# Patient Record
Sex: Female | Born: 1955 | Race: White | Hispanic: No | Marital: Married | Smoking: Never smoker
Health system: Southern US, Community
[De-identification: ages and names within clinical notes are randomized; demographics above are authoritative.]

## PROBLEM LIST (undated history)

## (undated) DIAGNOSIS — I7300 Raynaud's syndrome without gangrene: Secondary | ICD-10-CM

## (undated) DIAGNOSIS — T7840XA Allergy, unspecified, initial encounter: Secondary | ICD-10-CM

## (undated) DIAGNOSIS — Z9889 Other specified postprocedural states: Secondary | ICD-10-CM

## (undated) DIAGNOSIS — Z87442 Personal history of urinary calculi: Secondary | ICD-10-CM

## (undated) DIAGNOSIS — K219 Gastro-esophageal reflux disease without esophagitis: Secondary | ICD-10-CM

## (undated) DIAGNOSIS — R011 Cardiac murmur, unspecified: Secondary | ICD-10-CM

## (undated) DIAGNOSIS — I10 Essential (primary) hypertension: Secondary | ICD-10-CM

## (undated) DIAGNOSIS — M810 Age-related osteoporosis without current pathological fracture: Secondary | ICD-10-CM

## (undated) DIAGNOSIS — R002 Palpitations: Secondary | ICD-10-CM

## (undated) DIAGNOSIS — M1990 Unspecified osteoarthritis, unspecified site: Secondary | ICD-10-CM

## (undated) DIAGNOSIS — F419 Anxiety disorder, unspecified: Secondary | ICD-10-CM

## (undated) DIAGNOSIS — J302 Other seasonal allergic rhinitis: Secondary | ICD-10-CM

## (undated) DIAGNOSIS — G8929 Other chronic pain: Secondary | ICD-10-CM

## (undated) DIAGNOSIS — F329 Major depressive disorder, single episode, unspecified: Secondary | ICD-10-CM

## (undated) DIAGNOSIS — I011 Acute rheumatic endocarditis: Secondary | ICD-10-CM

## (undated) DIAGNOSIS — Z9289 Personal history of other medical treatment: Secondary | ICD-10-CM

## (undated) DIAGNOSIS — K589 Irritable bowel syndrome without diarrhea: Secondary | ICD-10-CM

## (undated) DIAGNOSIS — R32 Unspecified urinary incontinence: Secondary | ICD-10-CM

## (undated) DIAGNOSIS — F32A Depression, unspecified: Secondary | ICD-10-CM

## (undated) DIAGNOSIS — Z8709 Personal history of other diseases of the respiratory system: Secondary | ICD-10-CM

## (undated) DIAGNOSIS — J189 Pneumonia, unspecified organism: Secondary | ICD-10-CM

## (undated) DIAGNOSIS — M8580 Other specified disorders of bone density and structure, unspecified site: Secondary | ICD-10-CM

## (undated) DIAGNOSIS — R232 Flushing: Secondary | ICD-10-CM

## (undated) DIAGNOSIS — Z8669 Personal history of other diseases of the nervous system and sense organs: Secondary | ICD-10-CM

## (undated) DIAGNOSIS — L309 Dermatitis, unspecified: Secondary | ICD-10-CM

## (undated) DIAGNOSIS — N3010 Interstitial cystitis (chronic) without hematuria: Secondary | ICD-10-CM

## (undated) DIAGNOSIS — M549 Dorsalgia, unspecified: Secondary | ICD-10-CM

## (undated) DIAGNOSIS — Z5189 Encounter for other specified aftercare: Secondary | ICD-10-CM

## (undated) DIAGNOSIS — D649 Anemia, unspecified: Secondary | ICD-10-CM

## (undated) DIAGNOSIS — R200 Anesthesia of skin: Secondary | ICD-10-CM

## (undated) DIAGNOSIS — H547 Unspecified visual loss: Secondary | ICD-10-CM

## (undated) DIAGNOSIS — R112 Nausea with vomiting, unspecified: Secondary | ICD-10-CM

## (undated) HISTORY — PX: TONSILLECTOMY: SUR1361

## (undated) HISTORY — DX: Allergy, unspecified, initial encounter: T78.40XA

## (undated) HISTORY — DX: Major depressive disorder, single episode, unspecified: F32.9

## (undated) HISTORY — DX: Age-related osteoporosis without current pathological fracture: M81.0

## (undated) HISTORY — DX: Acute rheumatic endocarditis: I01.1

## (undated) HISTORY — PX: EXCISION MORTON'S NEUROMA: SHX5013

## (undated) HISTORY — DX: Irritable bowel syndrome, unspecified: K58.9

## (undated) HISTORY — DX: Interstitial cystitis (chronic) without hematuria: N30.10

## (undated) HISTORY — DX: Unspecified osteoarthritis, unspecified site: M19.90

## (undated) HISTORY — DX: Palpitations: R00.2

## (undated) HISTORY — DX: Encounter for other specified aftercare: Z51.89

## (undated) HISTORY — PX: COLONOSCOPY: SHX174

## (undated) HISTORY — DX: Gastro-esophageal reflux disease without esophagitis: K21.9

## (undated) HISTORY — PX: OTHER SURGICAL HISTORY: SHX169

## (undated) HISTORY — DX: Essential (primary) hypertension: I10

## (undated) HISTORY — PX: BACK SURGERY: SHX140

## (undated) HISTORY — DX: Personal history of other medical treatment: Z92.89

## (undated) HISTORY — DX: Flushing: R23.2

## (undated) HISTORY — PX: ENDOMETRIAL ABLATION: SHX621

## (undated) HISTORY — DX: Cardiac murmur, unspecified: R01.1

## (undated) HISTORY — DX: Depression, unspecified: F32.A

## (undated) HISTORY — DX: Unspecified urinary incontinence: R32

## (undated) HISTORY — DX: Anxiety disorder, unspecified: F41.9

## (undated) HISTORY — DX: Raynaud's syndrome without gangrene: I73.00

---
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
## 1995-07-15 HISTORY — PX: ABDOMINAL HYSTERECTOMY: SHX81

## 1997-07-14 HISTORY — PX: BREAST BIOPSY: SHX20

## 2004-07-01 ENCOUNTER — Ambulatory Visit: Payer: Self-pay | Admitting: Internal Medicine

## 2004-07-14 LAB — HM COLONOSCOPY

## 2004-08-23 ENCOUNTER — Ambulatory Visit: Payer: Self-pay | Admitting: Internal Medicine

## 2004-09-04 ENCOUNTER — Ambulatory Visit: Payer: Self-pay | Admitting: Cardiology

## 2004-09-17 ENCOUNTER — Ambulatory Visit: Payer: Self-pay

## 2004-09-19 ENCOUNTER — Ambulatory Visit: Payer: Self-pay | Admitting: Internal Medicine

## 2004-11-20 ENCOUNTER — Ambulatory Visit: Payer: Self-pay | Admitting: Internal Medicine

## 2004-11-26 ENCOUNTER — Ambulatory Visit: Payer: Self-pay | Admitting: Internal Medicine

## 2004-12-24 ENCOUNTER — Ambulatory Visit (HOSPITAL_COMMUNITY): Admission: RE | Admit: 2004-12-24 | Discharge: 2004-12-24 | Payer: Self-pay | Admitting: Internal Medicine

## 2004-12-24 ENCOUNTER — Ambulatory Visit: Payer: Self-pay | Admitting: Internal Medicine

## 2005-02-07 ENCOUNTER — Other Ambulatory Visit: Admission: RE | Admit: 2005-02-07 | Discharge: 2005-02-07 | Payer: Self-pay | Admitting: Obstetrics & Gynecology

## 2005-04-03 ENCOUNTER — Ambulatory Visit: Payer: Self-pay | Admitting: Endocrinology

## 2005-05-21 ENCOUNTER — Encounter: Admission: RE | Admit: 2005-05-21 | Discharge: 2005-05-21 | Payer: Self-pay | Admitting: Obstetrics & Gynecology

## 2005-06-16 ENCOUNTER — Ambulatory Visit: Payer: Self-pay | Admitting: Internal Medicine

## 2005-07-14 HISTORY — PX: ABDOMINAL HYSTERECTOMY: SHX81

## 2005-07-29 ENCOUNTER — Ambulatory Visit: Payer: Self-pay | Admitting: Internal Medicine

## 2005-09-15 ENCOUNTER — Ambulatory Visit: Payer: Self-pay | Admitting: Internal Medicine

## 2005-10-12 ENCOUNTER — Encounter: Payer: Self-pay | Admitting: Internal Medicine

## 2005-10-12 LAB — CONVERTED CEMR LAB: Pap Smear: NORMAL

## 2005-10-14 ENCOUNTER — Ambulatory Visit (HOSPITAL_COMMUNITY): Admission: RE | Admit: 2005-10-14 | Discharge: 2005-10-15 | Payer: Self-pay | Admitting: Obstetrics & Gynecology

## 2005-10-14 ENCOUNTER — Encounter (INDEPENDENT_AMBULATORY_CARE_PROVIDER_SITE_OTHER): Payer: Self-pay | Admitting: *Deleted

## 2006-01-22 ENCOUNTER — Ambulatory Visit: Payer: Self-pay | Admitting: Gastroenterology

## 2006-01-30 ENCOUNTER — Ambulatory Visit: Payer: Self-pay | Admitting: Gastroenterology

## 2006-02-13 ENCOUNTER — Ambulatory Visit: Payer: Self-pay | Admitting: Gastroenterology

## 2006-02-27 ENCOUNTER — Ambulatory Visit: Payer: Self-pay | Admitting: Internal Medicine

## 2006-03-03 ENCOUNTER — Ambulatory Visit: Payer: Self-pay | Admitting: Gastroenterology

## 2006-03-06 ENCOUNTER — Emergency Department (HOSPITAL_COMMUNITY): Admission: EM | Admit: 2006-03-06 | Discharge: 2006-03-06 | Payer: Self-pay | Admitting: Family Medicine

## 2006-03-19 ENCOUNTER — Ambulatory Visit: Payer: Self-pay | Admitting: Internal Medicine

## 2006-04-01 ENCOUNTER — Ambulatory Visit: Payer: Self-pay | Admitting: Gastroenterology

## 2006-04-21 ENCOUNTER — Ambulatory Visit: Payer: Self-pay | Admitting: Internal Medicine

## 2006-04-21 ENCOUNTER — Ambulatory Visit (HOSPITAL_COMMUNITY): Admission: RE | Admit: 2006-04-21 | Discharge: 2006-04-21 | Payer: Self-pay | Admitting: Internal Medicine

## 2006-04-21 LAB — CONVERTED CEMR LAB
ALT: 20 units/L (ref 0–40)
AST: 23 units/L (ref 0–37)
Albumin: 4.4 g/dL (ref 3.5–5.2)
Alkaline Phosphatase: 73 units/L (ref 39–117)
BUN: 13 mg/dL (ref 6–23)
Bacteria, U Microscopic: NEGATIVE /hpf
Basophils Absolute: 0.1 10*3/uL (ref 0.0–0.1)
Basophils Relative: 0.9 % (ref 0.0–1.0)
Bilirubin Urine: NEGATIVE
CO2: 26 meq/L (ref 19–32)
Calcium: 9.6 mg/dL (ref 8.4–10.5)
Chloride: 106 meq/L (ref 96–112)
Creatinine, Ser: 0.9 mg/dL (ref 0.4–1.2)
Crystals: NEGATIVE
Eosinophil percent: 1.5 % (ref 0.0–5.0)
Folate: 14.6 ng/mL
Free T4: 0.7 ng/dL — ABNORMAL LOW (ref 0.9–1.8)
GFR calc non Af Amer: 71 mL/min
Glomerular Filtration Rate, Af Am: 86 mL/min/{1.73_m2}
Glucose, Bld: 95 mg/dL (ref 70–99)
HCT: 44.3 % (ref 36.0–46.0)
Hemoglobin, Urine: NEGATIVE
Hemoglobin: 14.8 g/dL (ref 12.0–15.0)
Ketones, ur: NEGATIVE mg/dL
Leukocytes, UA: NEGATIVE
Lymphocytes Relative: 21.1 % (ref 12.0–46.0)
MCHC: 33.3 g/dL (ref 30.0–36.0)
MCV: 92.8 fL (ref 78.0–100.0)
Monocytes Absolute: 0.6 10*3/uL (ref 0.2–0.7)
Monocytes Relative: 7.3 % (ref 3.0–11.0)
Mucus, UA: NEGATIVE
Neutro Abs: 5.7 10*3/uL (ref 1.4–7.7)
Neutrophils Relative %: 69.2 % (ref 43.0–77.0)
Nitrite: NEGATIVE
Platelets: 211 10*3/uL (ref 150–400)
Potassium: 4.1 meq/L (ref 3.5–5.1)
RBC / HPF: NONE SEEN
RBC: 4.77 M/uL (ref 3.87–5.11)
RDW: 13.7 % (ref 11.5–14.6)
Rheumatoid Fact: <20 intl units/mL — ABNORMAL LOW (ref 0.0–20.0)
Sed Rate: 2 mm/hr (ref 0–25)
Sodium: 140 meq/L (ref 135–145)
Specific Gravity, Urine: >=1.03 (ref 1.000–1.03)
TSH: 1.57 microintl units/mL (ref 0.35–5.50)
Total Bilirubin: 0.6 mg/dL (ref 0.3–1.2)
Total Protein, Urine: NEGATIVE mg/dL
Total Protein: 7.2 g/dL (ref 6.0–8.3)
Urine Glucose: NEGATIVE mg/dL
Urobilinogen, UA: 0.2 (ref 0.0–1.0)
Vitamin B-12: 296 pg/mL (ref 211–911)
WBC number, urine, microscopy: NONE SEEN /hpf
WBC: 8.2 10*3/uL (ref 4.5–10.5)
pH: 5.5 (ref 5.0–8.0)

## 2006-05-04 ENCOUNTER — Ambulatory Visit: Payer: Self-pay | Admitting: Gastroenterology

## 2006-05-05 ENCOUNTER — Ambulatory Visit: Payer: Self-pay | Admitting: Gastroenterology

## 2006-05-20 ENCOUNTER — Encounter: Admission: RE | Admit: 2006-05-20 | Discharge: 2006-05-20 | Payer: Self-pay | Admitting: Obstetrics & Gynecology

## 2006-05-28 ENCOUNTER — Ambulatory Visit: Payer: Self-pay | Admitting: Internal Medicine

## 2006-07-06 ENCOUNTER — Ambulatory Visit (HOSPITAL_COMMUNITY): Admission: RE | Admit: 2006-07-06 | Discharge: 2006-07-06 | Payer: Self-pay | Admitting: Internal Medicine

## 2006-07-09 ENCOUNTER — Emergency Department (HOSPITAL_COMMUNITY): Admission: EM | Admit: 2006-07-09 | Discharge: 2006-07-09 | Payer: Self-pay | Admitting: Emergency Medicine

## 2006-07-13 ENCOUNTER — Ambulatory Visit: Payer: Self-pay | Admitting: Internal Medicine

## 2006-07-23 ENCOUNTER — Ambulatory Visit: Payer: Self-pay | Admitting: Internal Medicine

## 2006-07-30 ENCOUNTER — Ambulatory Visit: Payer: Self-pay | Admitting: Internal Medicine

## 2006-07-30 LAB — CONVERTED CEMR LAB
ALT: 25 units/L (ref 0–40)
AST: 29 units/L (ref 0–37)
Albumin: 3.7 g/dL (ref 3.5–5.2)
Alkaline Phosphatase: 72 units/L (ref 39–117)
Angiotensin 1 Converting Enzyme: 34 units/L (ref 9–67)
Anticardiolipin IgA: 7 (ref <13)
Anticardiolipin IgG: <7 (ref <11)
Anticardiolipin IgM: <7 (ref <10)
BUN: 10 mg/dL (ref 6–23)
CO2: 29 meq/L (ref 19–32)
Calcium: 9 mg/dL (ref 8.4–10.5)
Chloride: 104 meq/L (ref 96–112)
Creatinine, Ser: 0.8 mg/dL (ref 0.4–1.2)
ENA SM Ab Ser-aCnc: <0.2 (ref <1.0)
GFR calc Af Amer: 98 mL/min
GFR calc non Af Amer: 81 mL/min
Glucose, Bld: 85 mg/dL (ref 70–99)
HCT: 41.3 % (ref 36.0–46.0)
Hemoglobin: 13.7 g/dL (ref 12.0–15.0)
MCHC: 33.1 g/dL (ref 30.0–36.0)
MCV: 92.1 fL (ref 78.0–100.0)
Platelets: 169 10*3/uL (ref 150–400)
Potassium: 3.6 meq/L (ref 3.5–5.1)
RBC: 4.48 M/uL (ref 3.87–5.11)
RDW: 11.8 % (ref 11.5–14.6)
Sodium: 138 meq/L (ref 135–145)
Total Bilirubin: 0.5 mg/dL (ref 0.3–1.2)
Total Protein: 6.3 g/dL (ref 6.0–8.3)
WBC: 4.1 10*3/uL — ABNORMAL LOW (ref 4.5–10.5)

## 2006-08-26 ENCOUNTER — Ambulatory Visit (HOSPITAL_COMMUNITY): Admission: RE | Admit: 2006-08-26 | Discharge: 2006-08-26 | Payer: Self-pay | Admitting: Otolaryngology

## 2006-09-08 ENCOUNTER — Ambulatory Visit: Payer: Self-pay | Admitting: Internal Medicine

## 2006-09-08 LAB — CONVERTED CEMR LAB
Bilirubin Urine: NEGATIVE
Hemoglobin, Urine: NEGATIVE
Leukocytes, UA: NEGATIVE
Nitrite: NEGATIVE
Specific Gravity, Urine: 1.02 (ref 1.000–1.03)
Total Protein, Urine: NEGATIVE mg/dL
Urine Glucose: NEGATIVE mg/dL
Urobilinogen, UA: 0.2 (ref 0.0–1.0)
pH: 7 (ref 5.0–8.0)

## 2006-09-30 ENCOUNTER — Ambulatory Visit: Payer: Self-pay | Admitting: Internal Medicine

## 2007-01-21 ENCOUNTER — Ambulatory Visit (HOSPITAL_COMMUNITY): Admission: RE | Admit: 2007-01-21 | Discharge: 2007-01-21 | Payer: Self-pay | Admitting: Obstetrics & Gynecology

## 2007-01-21 ENCOUNTER — Encounter (INDEPENDENT_AMBULATORY_CARE_PROVIDER_SITE_OTHER): Payer: Self-pay | Admitting: Obstetrics & Gynecology

## 2007-02-11 ENCOUNTER — Emergency Department (HOSPITAL_COMMUNITY): Admission: EM | Admit: 2007-02-11 | Discharge: 2007-02-11 | Payer: Self-pay | Admitting: Emergency Medicine

## 2007-02-12 ENCOUNTER — Ambulatory Visit: Payer: Self-pay | Admitting: Internal Medicine

## 2007-02-12 ENCOUNTER — Telehealth (INDEPENDENT_AMBULATORY_CARE_PROVIDER_SITE_OTHER): Payer: Self-pay | Admitting: *Deleted

## 2007-02-13 DIAGNOSIS — K219 Gastro-esophageal reflux disease without esophagitis: Secondary | ICD-10-CM | POA: Insufficient documentation

## 2007-02-13 HISTORY — DX: Gastro-esophageal reflux disease without esophagitis: K21.9

## 2007-04-21 ENCOUNTER — Ambulatory Visit: Payer: Self-pay | Admitting: Internal Medicine

## 2007-04-22 ENCOUNTER — Ambulatory Visit: Payer: Self-pay | Admitting: Internal Medicine

## 2007-05-18 ENCOUNTER — Telehealth (INDEPENDENT_AMBULATORY_CARE_PROVIDER_SITE_OTHER): Payer: Self-pay | Admitting: *Deleted

## 2007-05-26 ENCOUNTER — Encounter: Admission: RE | Admit: 2007-05-26 | Discharge: 2007-05-26 | Payer: Self-pay | Admitting: Obstetrics & Gynecology

## 2007-06-25 ENCOUNTER — Ambulatory Visit: Payer: Self-pay | Admitting: Internal Medicine

## 2007-08-27 ENCOUNTER — Telehealth: Payer: Self-pay | Admitting: Internal Medicine

## 2007-09-06 ENCOUNTER — Ambulatory Visit (HOSPITAL_COMMUNITY): Admission: RE | Admit: 2007-09-06 | Discharge: 2007-09-06 | Payer: Self-pay | Admitting: Orthopedic Surgery

## 2007-09-17 ENCOUNTER — Emergency Department (HOSPITAL_COMMUNITY): Admission: EM | Admit: 2007-09-17 | Discharge: 2007-09-17 | Payer: Self-pay | Admitting: Family Medicine

## 2007-09-22 ENCOUNTER — Ambulatory Visit: Payer: Self-pay | Admitting: Internal Medicine

## 2007-09-28 ENCOUNTER — Encounter: Payer: Self-pay | Admitting: Internal Medicine

## 2007-10-08 ENCOUNTER — Ambulatory Visit (HOSPITAL_COMMUNITY): Admission: RE | Admit: 2007-10-08 | Discharge: 2007-10-08 | Payer: Self-pay | Admitting: Neurosurgery

## 2007-10-18 ENCOUNTER — Encounter: Payer: Self-pay | Admitting: Internal Medicine

## 2007-11-03 ENCOUNTER — Telehealth: Payer: Self-pay | Admitting: Internal Medicine

## 2007-11-25 ENCOUNTER — Telehealth: Payer: Self-pay | Admitting: Internal Medicine

## 2007-11-25 ENCOUNTER — Encounter: Payer: Self-pay | Admitting: Internal Medicine

## 2007-12-09 ENCOUNTER — Emergency Department (HOSPITAL_COMMUNITY): Admission: EM | Admit: 2007-12-09 | Discharge: 2007-12-10 | Payer: Self-pay | Admitting: Emergency Medicine

## 2008-01-12 ENCOUNTER — Encounter: Payer: Self-pay | Admitting: Internal Medicine

## 2008-02-04 ENCOUNTER — Encounter: Payer: Self-pay | Admitting: Internal Medicine

## 2008-02-11 ENCOUNTER — Ambulatory Visit: Payer: Self-pay | Admitting: Internal Medicine

## 2008-03-07 ENCOUNTER — Encounter: Payer: Self-pay | Admitting: Internal Medicine

## 2008-03-07 ENCOUNTER — Ambulatory Visit: Payer: Self-pay | Admitting: Internal Medicine

## 2008-03-07 HISTORY — PX: FLEXIBLE SIGMOIDOSCOPY: SHX1649

## 2008-03-08 ENCOUNTER — Ambulatory Visit: Payer: Self-pay | Admitting: Family Medicine

## 2008-03-09 ENCOUNTER — Ambulatory Visit (HOSPITAL_COMMUNITY): Admission: RE | Admit: 2008-03-09 | Discharge: 2008-03-09 | Payer: Self-pay | Admitting: Internal Medicine

## 2008-03-09 ENCOUNTER — Telehealth: Payer: Self-pay | Admitting: Internal Medicine

## 2008-03-09 ENCOUNTER — Ambulatory Visit: Payer: Self-pay | Admitting: Family Medicine

## 2008-03-13 ENCOUNTER — Ambulatory Visit: Payer: Self-pay | Admitting: Family Medicine

## 2008-03-14 ENCOUNTER — Telehealth (INDEPENDENT_AMBULATORY_CARE_PROVIDER_SITE_OTHER): Payer: Self-pay

## 2008-03-16 ENCOUNTER — Ambulatory Visit (HOSPITAL_COMMUNITY): Admission: RE | Admit: 2008-03-16 | Discharge: 2008-03-16 | Payer: Self-pay | Admitting: Internal Medicine

## 2008-03-28 ENCOUNTER — Telehealth: Payer: Self-pay | Admitting: Internal Medicine

## 2008-05-16 ENCOUNTER — Ambulatory Visit: Payer: Self-pay | Admitting: Internal Medicine

## 2008-05-16 LAB — CONVERTED CEMR LAB: Vit D, 1,25-Dihydroxy: 36 (ref 30–89)

## 2008-05-18 LAB — CONVERTED CEMR LAB
Basophils Absolute: 0 10*3/uL (ref 0.0–0.1)
Basophils Relative: 0.7 % (ref 0.0–3.0)
Eosinophils Absolute: 0.1 10*3/uL (ref 0.0–0.7)
Eosinophils Relative: 3.3 % (ref 0.0–5.0)
Folate: 17.4 ng/mL
HCT: 41.8 % (ref 36.0–46.0)
Hemoglobin: 14 g/dL (ref 12.0–15.0)
Lymphocytes Relative: 26.9 % (ref 12.0–46.0)
MCHC: 33.5 g/dL (ref 30.0–36.0)
MCV: 93.3 fL (ref 78.0–100.0)
Monocytes Absolute: 0.4 10*3/uL (ref 0.1–1.0)
Monocytes Relative: 10.2 % (ref 3.0–12.0)
Neutro Abs: 2.7 10*3/uL (ref 1.4–7.7)
Neutrophils Relative %: 58.9 % (ref 43.0–77.0)
Platelets: 190 10*3/uL (ref 150–400)
RBC: 4.48 M/uL (ref 3.87–5.11)
RDW: 12.6 % (ref 11.5–14.6)
TSH: 0.96 microintl units/mL (ref 0.35–5.50)
Vitamin B-12: 364 pg/mL (ref 211–911)
WBC: 4.4 10*3/uL — ABNORMAL LOW (ref 4.5–10.5)

## 2008-05-31 ENCOUNTER — Encounter: Admission: RE | Admit: 2008-05-31 | Discharge: 2008-05-31 | Payer: Self-pay | Admitting: Obstetrics & Gynecology

## 2008-06-05 ENCOUNTER — Telehealth: Payer: Self-pay | Admitting: Internal Medicine

## 2008-06-06 ENCOUNTER — Ambulatory Visit: Payer: Self-pay | Admitting: Internal Medicine

## 2008-06-06 LAB — CONVERTED CEMR LAB
Bilirubin Urine: NEGATIVE
Hemoglobin, Urine: NEGATIVE
Ketones, ur: NEGATIVE mg/dL
Leukocytes, UA: NEGATIVE
Nitrite: NEGATIVE
Specific Gravity, Urine: 1.01
Total Protein, Urine: NEGATIVE mg/dL
Urine Glucose: NEGATIVE mg/dL
Urobilinogen, UA: 0.2
pH: 7

## 2008-06-19 ENCOUNTER — Telehealth: Payer: Self-pay | Admitting: Internal Medicine

## 2008-08-08 ENCOUNTER — Encounter: Payer: Self-pay | Admitting: Internal Medicine

## 2008-08-08 ENCOUNTER — Telehealth: Payer: Self-pay | Admitting: Internal Medicine

## 2008-09-01 ENCOUNTER — Telehealth: Payer: Self-pay | Admitting: Internal Medicine

## 2008-09-19 ENCOUNTER — Telehealth: Payer: Self-pay | Admitting: Family Medicine

## 2008-09-19 ENCOUNTER — Ambulatory Visit: Payer: Self-pay | Admitting: Family Medicine

## 2008-09-20 ENCOUNTER — Encounter: Payer: Self-pay | Admitting: Internal Medicine

## 2008-09-20 ENCOUNTER — Telehealth: Payer: Self-pay | Admitting: Family Medicine

## 2008-09-20 LAB — CONVERTED CEMR LAB
Bilirubin Urine: NEGATIVE
Hemoglobin, Urine: NEGATIVE
Ketones, ur: NEGATIVE mg/dL
Leukocytes, UA: NEGATIVE
Nitrite: NEGATIVE
Specific Gravity, Urine: 1.02 (ref 1.000–1.035)
Total Protein, Urine: NEGATIVE mg/dL
Urine Glucose: NEGATIVE mg/dL
Urobilinogen, UA: 0.2 (ref 0.0–1.0)
pH: 6 (ref 5.0–8.0)

## 2008-09-29 ENCOUNTER — Telehealth: Payer: Self-pay | Admitting: Internal Medicine

## 2008-09-29 ENCOUNTER — Ambulatory Visit: Payer: Self-pay | Admitting: Internal Medicine

## 2008-09-29 LAB — CONVERTED CEMR LAB
ALT: 21 units/L (ref 0–35)
AST: 30 units/L (ref 0–37)
Albumin: 3.8 g/dL (ref 3.5–5.2)
Alkaline Phosphatase: 71 units/L (ref 39–117)
BUN: 11 mg/dL (ref 6–23)
Basophils Absolute: 0.1 10*3/uL (ref 0.0–0.1)
Basophils Relative: 1 % (ref 0.0–3.0)
CO2: 32 meq/L (ref 19–32)
Calcium: 9.1 mg/dL (ref 8.4–10.5)
Chloride: 105 meq/L (ref 96–112)
Creatinine, Ser: 0.6 mg/dL (ref 0.4–1.2)
Eosinophils Absolute: 0.1 10*3/uL (ref 0.0–0.7)
Eosinophils Relative: 1.7 % (ref 0.0–5.0)
GFR calc non Af Amer: 111.52 mL/min (ref >60)
Glucose, Bld: 90 mg/dL (ref 70–99)
HCT: 39.7 % (ref 36.0–46.0)
Hemoglobin: 13.5 g/dL (ref 12.0–15.0)
Lymphocytes Relative: 16.9 % (ref 12.0–46.0)
Lymphs Abs: 1.1 10*3/uL (ref 0.7–4.0)
MCHC: 34.1 g/dL (ref 30.0–36.0)
MCV: 92.8 fL (ref 78.0–100.0)
Monocytes Absolute: 0.5 10*3/uL (ref 0.1–1.0)
Monocytes Relative: 8.2 % (ref 3.0–12.0)
Neutro Abs: 4.6 10*3/uL (ref 1.4–7.7)
Neutrophils Relative %: 72.2 % (ref 43.0–77.0)
Platelets: 184 10*3/uL (ref 150.0–400.0)
Potassium: 3.9 meq/L (ref 3.5–5.1)
RBC: 4.28 M/uL (ref 3.87–5.11)
RDW: 13 % (ref 11.5–14.6)
Sodium: 142 meq/L (ref 135–145)
Total Bilirubin: 0.5 mg/dL (ref 0.3–1.2)
Total Protein: 6.6 g/dL (ref 6.0–8.3)
WBC: 6.4 10*3/uL (ref 4.5–10.5)

## 2008-10-02 ENCOUNTER — Encounter: Payer: Self-pay | Admitting: Internal Medicine

## 2008-10-02 ENCOUNTER — Telehealth: Payer: Self-pay | Admitting: Internal Medicine

## 2008-11-02 ENCOUNTER — Telehealth: Payer: Self-pay | Admitting: Family Medicine

## 2008-11-03 ENCOUNTER — Ambulatory Visit: Payer: Self-pay | Admitting: Family Medicine

## 2008-11-03 LAB — CONVERTED CEMR LAB
Bilirubin Urine: NEGATIVE
Blood in Urine, dipstick: NEGATIVE
Glucose, Urine, Semiquant: NEGATIVE
Ketones, urine, test strip: NEGATIVE
Nitrite: NEGATIVE
Protein, U semiquant: NEGATIVE
Specific Gravity, Urine: 1.02
Urobilinogen, UA: 0.2
WBC Urine, dipstick: NEGATIVE
pH: 5.5

## 2008-11-04 ENCOUNTER — Encounter: Payer: Self-pay | Admitting: Family Medicine

## 2008-11-16 ENCOUNTER — Ambulatory Visit: Payer: Self-pay | Admitting: Family Medicine

## 2008-11-21 ENCOUNTER — Telehealth: Payer: Self-pay | Admitting: Family Medicine

## 2008-11-23 ENCOUNTER — Ambulatory Visit (HOSPITAL_COMMUNITY): Admission: RE | Admit: 2008-11-23 | Discharge: 2008-11-23 | Payer: Self-pay | Admitting: Family Medicine

## 2008-11-27 ENCOUNTER — Telehealth: Payer: Self-pay | Admitting: Internal Medicine

## 2008-12-04 ENCOUNTER — Encounter: Payer: Self-pay | Admitting: Internal Medicine

## 2008-12-06 ENCOUNTER — Telehealth: Payer: Self-pay | Admitting: Internal Medicine

## 2009-02-07 ENCOUNTER — Telehealth: Payer: Self-pay | Admitting: Internal Medicine

## 2009-02-13 ENCOUNTER — Ambulatory Visit: Payer: Self-pay | Admitting: Family Medicine

## 2009-02-13 LAB — CONVERTED CEMR LAB
Bilirubin Urine: NEGATIVE
Blood in Urine, dipstick: NEGATIVE
Glucose, Urine, Semiquant: NEGATIVE
Ketones, urine, test strip: NEGATIVE
Nitrite: NEGATIVE
Specific Gravity, Urine: 1.01
Urobilinogen, UA: 0.2
WBC Urine, dipstick: NEGATIVE
pH: 6

## 2009-02-14 ENCOUNTER — Encounter: Payer: Self-pay | Admitting: Family Medicine

## 2009-02-28 ENCOUNTER — Telehealth: Payer: Self-pay | Admitting: Internal Medicine

## 2009-03-01 ENCOUNTER — Encounter: Payer: Self-pay | Admitting: Internal Medicine

## 2009-03-06 ENCOUNTER — Ambulatory Visit: Payer: Self-pay | Admitting: Internal Medicine

## 2009-03-08 LAB — CONVERTED CEMR LAB
Glucose, 1 Hour GTT: 83 mg/dL
Glucose, 2 hour: 87 mg/dL
Glucose, Fasting: 89 mg/dL (ref 70–99)
Glucose, GTT - 3 Hour: 98 mg/dL

## 2009-04-11 ENCOUNTER — Encounter: Admission: RE | Admit: 2009-04-11 | Discharge: 2009-04-11 | Payer: Self-pay | Admitting: Obstetrics and Gynecology

## 2009-04-25 ENCOUNTER — Encounter (INDEPENDENT_AMBULATORY_CARE_PROVIDER_SITE_OTHER): Payer: Self-pay | Admitting: *Deleted

## 2009-04-25 ENCOUNTER — Encounter: Admission: RE | Admit: 2009-04-25 | Discharge: 2009-04-25 | Payer: Self-pay | Admitting: Neurosurgery

## 2009-04-25 ENCOUNTER — Encounter: Payer: Self-pay | Admitting: Internal Medicine

## 2009-06-20 ENCOUNTER — Ambulatory Visit: Payer: Self-pay | Admitting: Family Medicine

## 2009-06-20 ENCOUNTER — Encounter (INDEPENDENT_AMBULATORY_CARE_PROVIDER_SITE_OTHER): Payer: Self-pay | Admitting: *Deleted

## 2009-06-20 ENCOUNTER — Encounter: Payer: Self-pay | Admitting: Internal Medicine

## 2009-06-22 ENCOUNTER — Encounter: Payer: Self-pay | Admitting: Internal Medicine

## 2009-07-17 ENCOUNTER — Telehealth: Payer: Self-pay | Admitting: Internal Medicine

## 2009-07-24 ENCOUNTER — Telehealth: Payer: Self-pay | Admitting: Internal Medicine

## 2009-07-26 ENCOUNTER — Telehealth: Payer: Self-pay | Admitting: Internal Medicine

## 2009-07-27 ENCOUNTER — Encounter: Payer: Self-pay | Admitting: Internal Medicine

## 2009-08-08 ENCOUNTER — Encounter: Admission: RE | Admit: 2009-08-08 | Discharge: 2009-08-08 | Payer: Self-pay | Admitting: Obstetrics & Gynecology

## 2009-08-23 ENCOUNTER — Ambulatory Visit: Payer: Self-pay | Admitting: Internal Medicine

## 2009-08-23 LAB — CONVERTED CEMR LAB
Bilirubin Urine: NEGATIVE
Blood in Urine, dipstick: NEGATIVE
Glucose, Urine, Semiquant: NEGATIVE
Ketones, urine, test strip: NEGATIVE
Nitrite: NEGATIVE
Protein, U semiquant: NEGATIVE
Specific Gravity, Urine: 1.025
Urobilinogen, UA: 0.2
WBC Urine, dipstick: NEGATIVE
pH: 5

## 2009-09-07 ENCOUNTER — Telehealth: Payer: Self-pay | Admitting: Internal Medicine

## 2009-09-13 ENCOUNTER — Encounter: Payer: Self-pay | Admitting: Internal Medicine

## 2009-09-19 ENCOUNTER — Ambulatory Visit: Payer: Self-pay | Admitting: Internal Medicine

## 2009-09-21 ENCOUNTER — Telehealth: Payer: Self-pay | Admitting: Internal Medicine

## 2009-10-09 ENCOUNTER — Telehealth: Payer: Self-pay | Admitting: Internal Medicine

## 2009-10-10 ENCOUNTER — Encounter: Payer: Self-pay | Admitting: Internal Medicine

## 2009-11-09 ENCOUNTER — Telehealth: Payer: Self-pay | Admitting: Internal Medicine

## 2009-11-14 ENCOUNTER — Ambulatory Visit: Payer: Self-pay | Admitting: Internal Medicine

## 2009-12-07 ENCOUNTER — Telehealth: Payer: Self-pay | Admitting: Internal Medicine

## 2009-12-14 ENCOUNTER — Ambulatory Visit: Payer: Self-pay | Admitting: Internal Medicine

## 2009-12-14 DIAGNOSIS — K589 Irritable bowel syndrome without diarrhea: Secondary | ICD-10-CM

## 2009-12-14 HISTORY — DX: Irritable bowel syndrome, unspecified: K58.9

## 2010-01-09 ENCOUNTER — Telehealth: Payer: Self-pay | Admitting: Internal Medicine

## 2010-02-06 ENCOUNTER — Telehealth: Payer: Self-pay | Admitting: Internal Medicine

## 2010-02-28 ENCOUNTER — Ambulatory Visit: Payer: Self-pay | Admitting: Internal Medicine

## 2010-02-28 LAB — CONVERTED CEMR LAB
Albumin: 4.3 g/dL (ref 3.5–5.2)
BUN: 16 mg/dL (ref 6–23)
Basophils Absolute: 0 10*3/uL (ref 0.0–0.1)
Basophils Relative: 0.3 % (ref 0.0–3.0)
CO2: 26 meq/L (ref 19–32)
Calcium: 9.5 mg/dL (ref 8.4–10.5)
Chloride: 103 meq/L (ref 96–112)
Creatinine, Ser: 0.9 mg/dL (ref 0.4–1.2)
Eosinophils Absolute: 0.2 10*3/uL (ref 0.0–0.7)
Eosinophils Relative: 3.2 % (ref 0.0–5.0)
GFR calc non Af Amer: 73.21 mL/min (ref >60)
Glucose, Bld: 81 mg/dL (ref 70–99)
HCT: 41.8 % (ref 36.0–46.0)
Hemoglobin: 14.1 g/dL (ref 12.0–15.0)
Lymphocytes Relative: 23 % (ref 12.0–46.0)
Lymphs Abs: 1.5 10*3/uL (ref 0.7–4.0)
MCHC: 33.8 g/dL (ref 30.0–36.0)
MCV: 92.5 fL (ref 78.0–100.0)
Monocytes Absolute: 0.6 10*3/uL (ref 0.1–1.0)
Monocytes Relative: 8.6 % (ref 3.0–12.0)
Neutro Abs: 4.3 10*3/uL (ref 1.4–7.7)
Neutrophils Relative %: 64.9 % (ref 43.0–77.0)
Phosphorus: 4.3 mg/dL (ref 2.3–4.6)
Platelets: 208 10*3/uL (ref 150.0–400.0)
Potassium: 4.6 meq/L (ref 3.5–5.1)
RBC: 4.52 M/uL (ref 3.87–5.11)
RDW: 13.7 % (ref 11.5–14.6)
Sodium: 140 meq/L (ref 135–145)
WBC: 6.6 10*3/uL (ref 4.5–10.5)

## 2010-03-07 ENCOUNTER — Ambulatory Visit: Payer: Self-pay | Admitting: Internal Medicine

## 2010-03-07 ENCOUNTER — Telehealth: Payer: Self-pay | Admitting: Internal Medicine

## 2010-04-01 ENCOUNTER — Emergency Department (HOSPITAL_COMMUNITY): Admission: EM | Admit: 2010-04-01 | Discharge: 2010-04-01 | Payer: Self-pay | Admitting: Emergency Medicine

## 2010-04-01 LAB — CONVERTED CEMR LAB
FSH: 108.4 milliintl units/mL
TSH: 0.87 microintl units/mL (ref 0.35–5.50)

## 2010-04-02 ENCOUNTER — Telehealth (INDEPENDENT_AMBULATORY_CARE_PROVIDER_SITE_OTHER): Payer: Self-pay | Admitting: *Deleted

## 2010-04-03 ENCOUNTER — Encounter: Payer: Self-pay | Admitting: Cardiology

## 2010-04-03 ENCOUNTER — Encounter (HOSPITAL_COMMUNITY): Admission: RE | Admit: 2010-04-03 | Discharge: 2010-06-14 | Payer: Self-pay | Admitting: Internal Medicine

## 2010-04-03 ENCOUNTER — Ambulatory Visit: Payer: Self-pay | Admitting: Cardiology

## 2010-04-03 ENCOUNTER — Ambulatory Visit: Payer: Self-pay

## 2010-04-09 ENCOUNTER — Telehealth: Payer: Self-pay | Admitting: Internal Medicine

## 2010-04-09 ENCOUNTER — Telehealth: Payer: Self-pay | Admitting: Cardiology

## 2010-04-10 ENCOUNTER — Ambulatory Visit: Payer: Self-pay | Admitting: Internal Medicine

## 2010-04-22 ENCOUNTER — Ambulatory Visit: Payer: Self-pay | Admitting: Internal Medicine

## 2010-04-22 ENCOUNTER — Telehealth: Payer: Self-pay | Admitting: Internal Medicine

## 2010-04-22 LAB — CONVERTED CEMR LAB
Bilirubin Urine: NEGATIVE
Hemoglobin, Urine: NEGATIVE
Ketones, ur: NEGATIVE mg/dL
Leukocytes, UA: NEGATIVE
Nitrite: NEGATIVE
Specific Gravity, Urine: >=1.03 (ref 1.000–1.030)
Total Protein, Urine: NEGATIVE mg/dL
Urine Glucose: NEGATIVE mg/dL
Urobilinogen, UA: 0.2 (ref 0.0–1.0)
pH: 5.5 (ref 5.0–8.0)

## 2010-04-23 ENCOUNTER — Encounter: Payer: Self-pay | Admitting: Internal Medicine

## 2010-04-24 ENCOUNTER — Ambulatory Visit: Payer: Self-pay | Admitting: Internal Medicine

## 2010-04-24 DIAGNOSIS — M79609 Pain in unspecified limb: Secondary | ICD-10-CM | POA: Insufficient documentation

## 2010-04-24 LAB — CONVERTED CEMR LAB
CRP, High Sensitivity: 4.47 (ref 0.00–5.00)
Sed Rate: 7 mm/hr (ref 0–22)

## 2010-04-26 LAB — CONVERTED CEMR LAB
ANA Titer 1: 1:40 {titer} — ABNORMAL HIGH
Anti Nuclear Antibody(ANA): POSITIVE — AB
Cyclic Citrullin Peptide Ab: <2 units (ref 0.0–5.0)
Rhuematoid fact SerPl-aCnc: <20 intl units/mL (ref 0–20)

## 2010-04-30 ENCOUNTER — Telehealth: Payer: Self-pay | Admitting: Internal Medicine

## 2010-06-12 ENCOUNTER — Encounter: Payer: Self-pay | Admitting: Internal Medicine

## 2010-06-14 ENCOUNTER — Telehealth: Payer: Self-pay | Admitting: Internal Medicine

## 2010-06-14 ENCOUNTER — Ambulatory Visit: Payer: Self-pay | Admitting: Internal Medicine

## 2010-06-14 DIAGNOSIS — N390 Urinary tract infection, site not specified: Secondary | ICD-10-CM | POA: Insufficient documentation

## 2010-06-14 LAB — CONVERTED CEMR LAB
Bilirubin Urine: NEGATIVE
Hemoglobin, Urine: NEGATIVE
Ketones, ur: NEGATIVE mg/dL
Leukocytes, UA: NEGATIVE
Nitrite: NEGATIVE
Specific Gravity, Urine: 1.025 (ref 1.000–1.030)
Total Protein, Urine: NEGATIVE mg/dL
Urine Glucose: NEGATIVE mg/dL
Urobilinogen, UA: 0.2 (ref 0.0–1.0)
pH: 6 (ref 5.0–8.0)

## 2010-07-03 ENCOUNTER — Encounter: Payer: Self-pay | Admitting: Internal Medicine

## 2010-07-15 ENCOUNTER — Encounter: Payer: Self-pay | Admitting: Family Medicine

## 2010-07-15 ENCOUNTER — Ambulatory Visit
Admission: RE | Admit: 2010-07-15 | Discharge: 2010-07-15 | Payer: Self-pay | Source: Home / Self Care | Admitting: Family Medicine

## 2010-07-18 ENCOUNTER — Encounter: Payer: Self-pay | Admitting: Family Medicine

## 2010-07-18 ENCOUNTER — Telehealth (INDEPENDENT_AMBULATORY_CARE_PROVIDER_SITE_OTHER): Payer: Self-pay | Admitting: *Deleted

## 2010-07-26 ENCOUNTER — Encounter: Payer: Self-pay | Admitting: Internal Medicine

## 2010-07-31 ENCOUNTER — Ambulatory Visit (HOSPITAL_COMMUNITY)
Admission: RE | Admit: 2010-07-31 | Discharge: 2010-07-31 | Payer: Self-pay | Source: Home / Self Care | Attending: Rheumatology | Admitting: Rheumatology

## 2010-08-03 ENCOUNTER — Other Ambulatory Visit: Payer: Self-pay | Admitting: Obstetrics & Gynecology

## 2010-08-03 ENCOUNTER — Encounter: Payer: Self-pay | Admitting: Orthopedic Surgery

## 2010-08-03 DIAGNOSIS — Z0000 Encounter for general adult medical examination without abnormal findings: Secondary | ICD-10-CM

## 2010-08-06 ENCOUNTER — Ambulatory Visit: Admit: 2010-08-06 | Payer: Self-pay | Attending: Family Medicine | Admitting: Family Medicine

## 2010-08-13 NOTE — Progress Notes (Signed)
Summary: prozac refill  Phone Note Refill Request Message from:  Fax from Pharmacy on July 17, 2009 12:20 PM  Refills Requested: Medication #1:  PROZAC 20 MG  CAPS 3 once daily  Method Requested: Electronic Initial call taken by: Kern Reap CMA Duncan Dull),  July 17, 2009 12:20 PM    Prescriptions: PROZAC 20 MG  CAPS (FLUOXETINE HCL) 3 once daily  #270 x 0   Entered by:   Kern Reap CMA (AAMA)   Authorized by:   [name] MD   Signed by:   Kern Reap CMA (AAMA) on 07/17/2009   Method used:   [facility] [address]       [address]       [place], [place]  [address]       Ph: [phone] or [phone]       Fax: [phone]   RxID:   [id number]

## 2010-08-13 NOTE — Progress Notes (Signed)
Summary: refill fluxetine   Phone Note Call from Patient Call back at Work Phone [phone]   Caller: Patient-LIVE CALL Call For: [name] MD Summary of Call: rf fluoxetine 60mg  1 once daily. [facility] at [phone] needs 90 day refill  Initial call taken by: Warnell Forester,  August 27, 2007 8:45 AM  Follow-up for Phone Call        Rx done electronically.Patient notified.  Follow-up by: Gladis Riffle, RN,  August 30, 2007 1:18 PM      Prescriptions: PROZAC 20 MG  CAPS (FLUOXETINE HCL) one tid  #90 x 0   Entered by:   Gladis Riffle, RN   Authorized by:   [name] MD   Signed by:   Gladis Riffle, RN on 08/30/2007   Method used:   Electronically sent to ...       [facility]*       [address]       [address] Shipping/mailing       [place], [place]  [address]       Ph: [phone]       Fax: [phone]   RxID:   [id number]

## 2010-08-13 NOTE — Assessment & Plan Note (Signed)
Summary: fu on hand/njr   Vital Signs:  Patient Profile:   55 Years Old Female Height:     63 inches Weight:      121 pounds Temp:     98.1 degrees F oral Pulse rate:   58 / minute Pulse rhythm:   regular BP sitting:   122 / 62  (left arm) Cuff size:   regular  Vitals Entered By: Alfred Levins, CMA (March 13, 2008 3:41 PM)                 PCP:  [name], MD  Chief Complaint:  f/u on cat bite.  History of Present Illness: Here to recheck a cat bite to the right hand. It has improved dramatically. Still a bit tender but much better.    Current Allergies: ! PCN ! PHENERGAN ! DOXYCYCLINE ! MORPHINE SULFATE CR (MORPHINE SULFATE)  Past Medical History:    Reviewed history from 02/11/2008 and no changes required:       Multiple Sclerosis       Depression       H/O Gestational Hypertension       H/O Blood Transfusion, 3 units- 1991       GERD       Anxiety       Arthritis     Review of Systems  The patient denies anorexia, fever, weight loss, weight gain, vision loss, decreased hearing, hoarseness, chest pain, syncope, dyspnea on exertion, peripheral edema, prolonged cough, headaches, hemoptysis, abdominal pain, melena, hematochezia, severe indigestion/heartburn, hematuria, incontinence, genital sores, muscle weakness, suspicious skin lesions, transient blindness, difficulty walking, depression, unusual weight change, abnormal bleeding, enlarged lymph nodes, angioedema, breast masses, and testicular masses.     Physical Exam  General:     Well-developed,well-nourished,in no acute distress; alert,appropriate and cooperative throughout examination Skin:     right hand is much improved with less swelling and tenderness, no erythema at all    Impression & Recommendations:  Problem # 1:  CELLULITIS&ABSCESS OF HAND EXCEPT FINGERS&THUMB (ICD-682.4)  Her updated medication list for this problem includes:    Avelox 400 Mg Tabs (Moxifloxacin hcl) ..... Once daily     Clindamycin Hcl 300 Mg Caps (Clindamycin hcl) .Marland KitchenMarland KitchenMarland KitchenMarland Kitchen 4 times a day   Complete Medication List: 1)  Ambien 10 Mg Tabs (Zolpidem tartrate) .... 1/2 by mouth as needed for insomnia 2)  Prozac 20 Mg Caps (Fluoxetine hcl) .... 3 once daily 3)  Copaxone 20 Mg/ml Kit (Glatiramer acetate) .... Subcutaneously daily 4)  Carafate 1 Gm/70ml Susp (Sucralfate) .... One tablespoon tid 5)  First-mouthwash Blm Susp (Dph-lido-alhydr-mghydr-simeth) .... As needed 6)  Systane 0.4-0.3 % Soln (Polyethyl glycol-propyl glycol) .... Three times a day 7)  Analpram-hc 1-2.5 % Crea (Hydrocortisone ace-pramoxine) .... As needed for rectal bleeding, hemorrhoids 8)  Miralax Powd (Polyethylene glycol 3350) .... 3 to 4 times weekly 9)  Vivelle-dot 0.075 Mg/24hr Pttw (Estradiol) .... As directed 10)  Avelox 400 Mg Tabs (Moxifloxacin hcl) .... Once daily 11)  Clindamycin Hcl 300 Mg Caps (Clindamycin hcl) .... 4 times a day   Patient Instructions: 1)  Finish out antibiotics.  2)  Please schedule a follow-up appointment as needed.   ]

## 2010-08-13 NOTE — Letter (Signed)
Summary: [facility]-page 2 missing  [facility]-page 2 missing   Imported By: Maryln Gottron 10/02/2009 12:35:32  _____________________________________________________________________  External Attachment:    Type:   Image     Comment:   External Document

## 2010-08-13 NOTE — Progress Notes (Signed)
Summary: cramping and diarrhea  Medications Added DIPHENOXYLATE-ATROPINE 2.5-0.025 MG TABS (DIPHENOXYLATE-ATROPINE) 1 by mouth q 6 hrs as needed cramps/diarrhea METRONIDAZOLE 500 MG TABS (METRONIDAZOLE) 1 by mouth three times a day       Phone Note Call from Patient Call back at Home Phone [phone]   Caller: Pt Call For: Dr Leone Payor Summary of Call: Rosalita Chessman called this am , having severe cramps and diarrhea.  Called in to work this am, hasn't been able to return stool studies ordered.  States robinul doesn't help with the cramping.  Please advise Initial call taken by: Darcey Nora RN,  October 02, 2008 11:21 AM  Follow-up for Phone Call        She needs to get [place] the stool studies ok to start Metronidazole 500 mg three times a day as nong as no fever or vomiting can use Lomotil 1-2 q 6 hrs as needed #60, no refills (I printed) double check for drug interactions on the rxes please Follow-up by: Iva Boop MD,  October 02, 2008 12:32 PM  Additional Follow-up for Phone Call Additional follow up Details #1::        How long on the flagyl? Darcey Nora RN  October 02, 2008 12:48 PM 500 mg three times a day x 10 days Additional Follow-up by: Iva Boop MD,  October 02, 2008 1:11 PM    Additional Follow-up for Phone Call Additional follow up Details #2::    faxed lomotil RX- patient advised.  Allergies verified with her  Follow-up by: Darcey Nora RN,  October 02, 2008 1:45 PM  New/Updated Medications: DIPHENOXYLATE-ATROPINE 2.5-0.025 MG TABS (DIPHENOXYLATE-ATROPINE) 1 by mouth q 6 hrs as needed cramps/diarrhea METRONIDAZOLE 500 MG TABS (METRONIDAZOLE) 1 by mouth three times a day   Prescriptions: METRONIDAZOLE 500 MG TABS (METRONIDAZOLE) 1 by mouth three times a day  #30 x 0   Entered by:   Darcey Nora RN   Authorized by:   Iva Boop MD   Signed by:   Darcey Nora RN on 10/02/2008   Method used:   [facility]        [facility]  [place] [address] [facility]       [address]       [place], [place]  [address]       Ph: [phone] or [phone]       Fax: [phone]   RxID:   [id number] DIPHENOXYLATE-ATROPINE 2.5-0.025 MG TABS (DIPHENOXYLATE-ATROPINE) 1 by mouth q 6 hrs as needed cramps/diarrhea  #60 x 0   Entered and Authorized by:   Iva Boop MD   Signed by:   Iva Boop MD on 10/02/2008   Method used:   Printed then faxed to ...       [facility]  [place] [address] ([facility]ail[facility]       [address]       [place], [place]  [address]       Ph: [phone] or [phone]       Fax: [phone]   RxID:   [id number]

## 2010-08-13 NOTE — Assessment & Plan Note (Signed)
Summary: f/u on cat bite/cdw   Vital Signs:  Patient Profile:   55 Years Old Female Height:     63 inches Temp:     98.2 degrees F oral BP sitting:   122 / 74  (left arm) Cuff size:   regular  Vitals Entered By: Alfred Levins, CMA (March 09, 2008 8:18 AM)                 PCP:  [name], MD  Chief Complaint:  f/u on cat bite.  History of Present Illness: Was seen yesterday for a cat bite to the right  hand. We gave her a Rocephin shot and started her on Avelox and Clindamycin. Here for a recheck. She thinks it is slightly better. The swelling and pain is about the same. Still no fever. Took a Vicodin last night and slept well.     Current Allergies: ! PCN ! PHENERGAN ! DOXYCYCLINE ! MORPHINE SULFATE CR (MORPHINE SULFATE)  Past Medical History:    Reviewed history from 02/11/2008 and no changes required:       Multiple Sclerosis       Depression       H/O Gestational Hypertension       H/O Blood Transfusion, 3 units- 1991       GERD       Anxiety       Arthritis     Review of Systems  The patient denies anorexia, fever, weight loss, weight gain, vision loss, decreased hearing, hoarseness, chest pain, syncope, dyspnea on exertion, peripheral edema, prolonged cough, headaches, hemoptysis, abdominal pain, melena, hematochezia, severe indigestion/heartburn, hematuria, incontinence, genital sores, muscle weakness, suspicious skin lesions, transient blindness, difficulty walking, depression, unusual weight change, abnormal bleeding, enlarged lymph nodes, angioedema, breast masses, and testicular masses.     Physical Exam  General:     Well-developed,well-nourished,in no acute distress; alert,appropriate and cooperative throughout examination Msk:     the right hand is still swollen and tender at the wrist and the base of the thumb, but it is not as red or as warm as yesterday     Impression & Recommendations:  Problem # 1:  CELLULITIS&ABSCESS OF HAND EXCEPT  FINGERS&THUMB (ICD-682.4)  Her updated medication list for this problem includes:    Avelox 400 Mg Tabs (Moxifloxacin hcl) ..... Once daily    Clindamycin Hcl 300 Mg Caps (Clindamycin hcl) .Marland KitchenMarland KitchenMarland KitchenMarland Kitchen 4 times a day  Orders: Rocephin  250mg  (D6644) Admin of Therapeutic Inj  intramuscular or subcutaneous (03474)   Complete Medication List: 1)  Ambien 10 Mg Tabs (Zolpidem tartrate) .... 1/2 by mouth as needed for insomnia 2)  Prozac 20 Mg Caps (Fluoxetine hcl) .... 3 once daily 3)  Copaxone 20 Mg/ml Kit (Glatiramer acetate) .... Subcutaneously daily 4)  Carafate 1 Gm/40ml Susp (Sucralfate) .... One tablespoon tid 5)  First-mouthwash Blm Susp (Dph-lido-alhydr-mghydr-simeth) .... As needed 6)  Systane 0.4-0.3 % Soln (Polyethyl glycol-propyl glycol) .... Three times a day 7)  Analpram-hc 1-2.5 % Crea (Hydrocortisone ace-pramoxine) .... As needed for rectal bleeding, hemorrhoids 8)  Miralax Powd (Polyethylene glycol 3350) .... 3 to 4 times weekly 9)  Vivelle-dot 0.075 Mg/24hr Pttw (Estradiol) .... As directed 10)  Avelox 400 Mg Tabs (Moxifloxacin hcl) .... Once daily 11)  Clindamycin Hcl 300 Mg Caps (Clindamycin hcl) .... 4 times a day   Patient Instructions: 1)  Given a second Rocephin shot today. She will continue the oral meds as above. Recheck here on 03-13-08.   ]  Tetanus/Td Immunization History:    Tetanus/Td # 1:  Historical (07/15/2003)   Medication Administration  Injection # 1:    Medication: Rocephin  250mg     Diagnosis: CELLULITIS&ABSCESS OF HAND EXCEPT FINGERS&THUMB (ICD-682.4)    Route: IM    Site: RUOQ gluteus    Exp Date: 10/2010    Lot #: [id number]    Mfr: Sandoz    Comments: 1gm    Patient tolerated injection without complications    Given by: Alfred Levins, CMA (March 09, 2008 9:12 AM)  Orders Added: 1)  Est. Patient Level III [10272] 2)  Rocephin  250mg  [J0696] 3)  Admin of Therapeutic Inj  intramuscular or subcutaneous [536[address]]

## 2010-08-13 NOTE — Assessment & Plan Note (Signed)
Summary: rash/ccm   Vital Signs:  Patient Profile:   55 Years Old Female Weight:      120 pounds Temp:     98 degrees F oral BP sitting:   130 / 82  Pt. in pain?   no  Vitals Entered By: Lynann Beaver CMA (February 12, 2007 4:43 PM)                Chief Complaint:  poison ivy right arm.  Acute Visit History:      The patient complains of rash.  The rash began 2 days ago.  It is pruritic.  It is described as urticarial and is located on the arms.  Comments: pruritic rash, oozing, after working in yard and playing with dogs. no fever, no pain, no trauma.                Impression & Recommendations:  Problem # 1:  DERMATITIS, CONTACT, NEC (ICD-692.89) "poison ivy" discussed treatment options---call for increasing erythema or for fever The following medications were removed from the medication list:    Prednisone 10 Mg Tabs (Prednisone) .Marland Kitchen... Take tablet by mouth  Her updated medication list for this problem includes:    Prednisone 20 Mg Tabs (Prednisone) .Marland Kitchen..Marland Kitchen Two daily yesterday only    Prednisone 10 Mg Tabs (Prednisone) .Marland KitchenMarland KitchenMarland KitchenMarland Kitchen 3 po qd for 3 days, then 2 po qd for 3 days, then 1 po qd for 3 days, then 1/2 po qd for 3 days    Olux 0.05 % Foam (Clobetasol propionate) .Marland Kitchen..Marland Kitchen Two times a day   Complete Medication List: 1)  Ambien 10 Mg Tabs (Zolpidem tartrate) .... Take 1 tablet by mouth at bedtime 2)  Prozac 20 Mg Caps (Fluoxetine hcl) .... One tid 3)  Copaxone 20 Mg/ml Kit (Glatiramer acetate) .... Subcutaneously daily 4)  Prednisone 20 Mg Tabs (Prednisone) .... Two daily yesterday only 5)  Tegretol Xr 100 Mg Tb12 (Carbamazepine) .... One every night 6)  Neurontin 100 Mg Caps (Gabapentin) .... One bid 7)  Prednisone 10 Mg Tabs (Prednisone) .... 3 po qd for 3 days, then 2 po qd for 3 days, then 1 po qd for 3 days, then 1/2 po qd for 3 days 8)  Hydroxyzine Hcl 25 Mg Tabs (Hydroxyzine hcl) .... Three times a day as needed 9)  Olux 0.05 % Foam (Clobetasol propionate) ....  Two times a day      Prescriptions: HYDROXYZINE HCL 25 MG  TABS (HYDROXYZINE HCL) three times a day as needed  #20 x 1   Entered and Authorized by:   [name] MD   Signed by:   [name] MD on 02/12/2007   Method used:   [facility] ...       [phone] [place] [address]       [address]       [place], [place]  [address]       Ph: [phone] or [phone]       Fax: [phone]   RxID:   [id number] PREDNISONE 10 MG TABS (PREDNISONE) 3 po qd for 3 days, then 2 po qd for 3 days, then 1 po qd for 3 days, then 1/2 po qd for 3 days  #25 x 0   Entered and Authorized by:   [name] MD   Signed by:   [name] MD on 02/12/2007   Method used:   Electron[facility] to ...       [phone] [place]  [address]       [address]       [place], [place]  [address]       Ph: [phone] or [phone]       Fax: [phone]   RxID:   [id number]        Appended Document: rash/ccm        Past Medical History:    Reviewed history from 09/30/2006 and no changes required:       Multiple Sclerosis       Depression  Past Surgical History:    Reviewed history from 09/30/2006 and no changes required:       Hysterectomy-endometriosis and fibroids   Family History:    Reviewed history from 09/30/2006 and no changes required:       mother alive with htn and lipids       Family History High cholesterol       Family History Hypertension       Family History of CAD Female 1st degree relative <50 father but died of suicide       Family History of Suicide attempt  Social History:    Reviewed history from 09/30/2006 and no changes required:       Occupation: Engineer, civil (consulting) [employer]       Married       2 kids both healthy       Never Smoked       Alcohol use-no       Regular exercise-no--too tired     Physical Exam  General:     Well-developed,well-nourished,in no acute distress; alert,appropriate and cooperative throughout examination Mouth:     Oral  mucosa and oropharynx without lesions or exudates.  Teeth in good repair. Neck:     No deformities, masses, or tenderness noted. Lungs:     Normal respiratory effort, chest expands symmetrically. Lungs are clear to auscultation, no crackles or wheezes. Skin:     weeping, raised rash Right arm    Complete Medication List: 1)  Ambien 10 Mg Tabs (Zolpidem tartrate) .... Take 1 tablet by mouth at bedtime 2)  Prozac 20 Mg Caps (Fluoxetine hcl) .... One tid 3)  Copaxone 20 Mg/ml Kit (Glatiramer acetate) .... Subcutaneously daily 4)  Prednisone 20 Mg Tabs (Prednisone) .... Two daily yesterday only 5)  Tegretol Xr 100 Mg Tb12 (Carbamazepine) .... One every night 6)  Neurontin 100 Mg Caps (Gabapentin) .... One bid 7)  Prednisone 10 Mg Tabs (Prednisone) .... 3 po qd for 3 days, then 2 po qd for 3 days, then 1 po qd for 3 days, then 1/2 po qd for 3 days 8)  Hydroxyzine Hcl 25 Mg Tabs (Hydroxyzine hcl) .... Three times a day as needed 9)  Olux 0.05 % Foam (Clobetasol propionate) .... Two times a day

## 2010-08-13 NOTE — Letter (Signed)
Summary: [facility]  [facility]   Imported By: Maryln Gottron 05/11/2009 14:42:15  _____________________________________________________________________  External Attachment:    Type:   Image     Comment:   External Document

## 2010-08-13 NOTE — Assessment & Plan Note (Signed)
Summary: neck pain/dm   Vital Signs:  Patient profile:   55 year old female Weight:      121 pounds Temp:     98.6 degrees F oral BP sitting:   114 / 78  (right arm) Cuff size:   regular  Vitals Entered By: Duard Brady LPN (September 20, 4694 10:58 AM) CC: c/o neck pain x73mos - has seen ortho and neuro - wants referral for 2nd opinion with neuro Is Patient Diabetic? No   Primary Care Provider:  [name], MD  CC:  c/o neck pain x85mos - has seen ortho and neuro - wants referral for 2nd opinion with neuro.  History of Present Illness: 55 year old patient who has at least a two month history of neck pain and decreased range of motion.  She has been followed by neurology at [facility] and has also seen.  A local orthopedic physician and neurosurgeon.  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  She is request and a second neurological opinion about her neck pain.  Situation was discussed at length.  It was felt that a second neurosurgical opinion would be most appropriate. Medical problems include a history of hypertension, which has been stable  Preventive Screening-Counseling & Management  Alcohol-Tobacco     Smoking Status: never  Allergies: 1)  ! Pcn 2)  ! Phenergan 3)  ! Doxycycline 4)  ! Morphine Sulfate Cr (Morphine Sulfate)  Past History:  Past Medical History: Last updated: 02/11/2008 Multiple Sclerosis Depression H/O Gestational Hypertension H/O Blood Transfusion, 3 units- 1991 GERD Anxiety Arthritis  Review of Systems  The patient denies anorexia, fever, weight loss, weight gain, vision loss, decreased hearing, hoarseness, chest pain, syncope, dyspnea on exertion, peripheral edema, prolonged cough, headaches, hemoptysis, abdominal pain, melena, hematochezia, severe indigestion/heartburn, hematuria, incontinence, genital sores, muscle weakness, suspicious skin lesions,  transient blindness, difficulty walking, depression, unusual weight change, abnormal bleeding, enlarged lymph nodes, angioedema, and breast masses.    Physical Exam  General:  Well-developed,well-nourished,in no acute distress; alert,appropriate and cooperative throughout examination Head:  decreased range of motion of the neck except flexion is intact   Impression & Recommendations:  Problem # 1:  NECK PAIN (ICD-723.1)  Her updated medication list for this problem includes:    Flexeril 10 Mg Tabs (Cyclobenzaprine hcl) .Marland Kitchen... Take 1 tablet by mouth once a day as needed  Orders: Neurosurgeon Referral (Neurosurgeon)  Problem # 2:  HYPERTENSION, GESTATIONAL (ICD-642.90)  Complete Medication List: 1)  Prozac 20 Mg Caps (Fluoxetine hcl) .... 3 once daily 2)  Carafate 1 Gm/54ml Susp (Sucralfate) .... One tablespoon three times a day and at bedtime 3)  First-mouthwash Blm Susp (Dph-lido-alhydr-mghydr-simeth) .... As needed 4)  Systane 0.4-0.3 % Soln (Polyethyl glycol-propyl glycol) .... Three times a day 5)  Analpram-hc 1-2.5 % Crea (Hydrocortisone ace-pramoxine) .... As needed for rectal bleeding, hemorrhoids 6)  Miralax Powd (Polyethylene glycol 3350) .... 3 to 4 times weekly 7)  Vivelle-dot 0.075 Mg/24hr Pttw (Estradiol) .... As directed 8)  Flexeril 10 Mg Tabs (Cyclobenzaprine hcl) .... Take 1 tablet by mouth once a day as needed 9)  Nexium 40 Mg Cpdr (Esomeprazole magnesium) .Marland Kitchen.. 1 capsule each day 30 minutes before meal 10)  Diphenoxylate-atropine 2.5-0.025 Mg Tabs (Diphenoxylate-atropine) .Marland Kitchen.. 1 by mouth q 6 hrs as needed cramps/diarrhea 11)  Prochlorperazine Maleate 10 Mg Tabs (Prochlorperazine maleate) .Marland Kitchen.. 1 q 6 hours as needed nausea 12)  Trazodone Hcl 50 Mg Tabs (Trazodone hcl) .... 1/2-1 by mouth q hs as needed insomnia 13)  Tussionex Pennkinetic Er 8-10 Mg/6ml Lqcr (Chlorpheniramine-hydrocodone) .Marland Kitchen.. 1 teaspoon at bedtime as needed cough 14)  Zomig 5 Mg Tabs (Zolmitriptan)  .... One tablet orally at the time of headache.  may repeat x1 in one hour if headache persists.  Patient Instructions: 1)  Please schedule an appointment with your primary doctor in :  3 months 2)  Neurosurgical follow-up as scheduled

## 2010-08-13 NOTE — Progress Notes (Signed)
Summary: Refill Ambien   Phone Note Refill Request Message from:  Fax from Pharmacy on Dec 06, 2008 9:17 AM  Refills Requested: Medication #1:  AMBIEN 10 MG TABS 1 by mouth as needed for insomnia   Dosage confirmed as above?Dosage Confirmed   Supply Requested: 1 month   Last Refilled: 09/29/2008 Dr. Leone Payor, is it OK to refill this medication?   Follow-up for Phone Call        ok to refill x 1 month, ask that she sould get through PCP or neurologist in [place] Follow-up by: Iva Boop MD,  Dec 06, 2008 9:59 AM  Additional Follow-up for Phone Call Additional follow up Details #1::        notified pt refill sent and additional refills should come from PCP or neurologist.  pt is agreeable.    New/Updated Medications: AMBIEN 10 MG TABS (ZOLPIDEM TARTRATE) 1 by mouth as needed for insomnia   Prescriptions: AMBIEN 10 MG TABS (ZOLPIDEM TARTRATE) 1 by mouth as needed for insomnia  #30 x 0   Entered by:   Francee Piccolo CMA   Authorized by:   Iva Boop MD   Signed by:   Francee Piccolo CMA on 12/06/2008   Method used:   Historical   RxID:   [id number]   Appended Document: Refill Ambien Clinical Lists Changes  Medications: Rx of AMBIEN 10 MG TABS (ZOLPIDEM TARTRATE) 1 by mouth as needed for insomnia;  #30 x 0;  Signed;  Entered by: Francee Piccolo CMA;  Authorized by: Iva Boop MD;  Method used: Reprint  Prescriptions: AMBIEN 10 MG TABS (ZOLPIDEM TARTRATE) 1 by mouth as needed for insomnia  #30 x 0   Entered by:   Francee Piccolo CMA   Authorized by:   Iva Boop MD   Signed by:   Francee Piccolo CMA on 12/21/2008   Method used:   Reprint   RxID:   [id number]

## 2010-08-13 NOTE — Progress Notes (Signed)
Summary: refill--flexeril, not done  Phone Note Call from Patient Call back at Home Phone [phone]   Caller: pt live Call For: [name] Summary of Call: flexaril 10mg  take 1 3 times a day for muscle spasms.  Outpatient pharmacy at [facility]  Initial call taken by: Roselle Locus,  November 03, 2007 1:28 PM  Follow-up for Phone Call        OK to call in #20 to hold her over, but should get refill from neurologist per Dr Cato Mulligan.  Spoke with pt and she will get Rx from neuro and not need the 20 from [place]. Follow-up by: Gladis Riffle, RN,  November 04, 2007 1:04 PM

## 2010-08-13 NOTE — Assessment & Plan Note (Signed)
Summary: severe sinus inf/cough/sore throat/cjr   Vital Signs:  Patient profile:   55 year old female Weight:      118 pounds Temp:     97.9 degrees F oral BP sitting:   116 / 78  (left arm) Cuff size:   regular  Vitals Entered By: Alfred Levins, CMA (June 20, 2009 3:15 PM) CC: sinus pressure, nose is raw and burns x1 wk   History of Present Illness: Here for 5 days of sinus pressure and pain, PND, ST, and right ear pain. No cough or fever.  Allergies: 1)  ! Pcn 2)  ! Phenergan 3)  ! Doxycycline 4)  ! Morphine Sulfate Cr (Morphine Sulfate)  Past History:  Past Medical History: Reviewed history from 02/11/2008 and no changes required. Multiple Sclerosis Depression H/O Gestational Hypertension H/O Blood Transfusion, 3 units- 1991 GERD Anxiety Arthritis  Review of Systems  The patient denies anorexia, fever, weight loss, weight gain, vision loss, decreased hearing, hoarseness, chest pain, syncope, dyspnea on exertion, peripheral edema, prolonged cough, hemoptysis, abdominal pain, melena, hematochezia, severe indigestion/heartburn, hematuria, incontinence, genital sores, muscle weakness, suspicious skin lesions, transient blindness, difficulty walking, depression, unusual weight change, abnormal bleeding, enlarged lymph nodes, angioedema, breast masses, and testicular masses.    Physical Exam  General:  Well-developed,well-nourished,in no acute distress; alert,appropriate and cooperative throughout examination Head:  Normocephalic and atraumatic without obvious abnormalities. No apparent alopecia or balding. Eyes:  No corneal or conjunctival inflammation noted. EOMI. Perrla. Funduscopic exam benign, without hemorrhages, exudates or papilledema. Vision grossly normal. Ears:  External ear exam shows no significant lesions or deformities.  Otoscopic examination reveals clear canals, tympanic membranes are intact bilaterally without bulging, retraction, inflammation or  discharge. Hearing is grossly normal bilaterally. Nose:  External nasal examination shows no deformity or inflammation. Nasal mucosa are pink and moist without lesions or exudates. Mouth:  Oral mucosa and oropharynx without lesions or exudates.  Teeth in good repair. Neck:  No deformities, masses, or tenderness noted. Lungs:  Normal respiratory effort, chest expands symmetrically. Lungs are clear to auscultation, no crackles or wheezes.   Impression & Recommendations:  Problem # 1:  ACUTE SINUSITIS, UNSPECIFIED (ICD-461.9)  Her updated medication list for this problem includes:    Zithromax Z-pak 250 Mg Tabs (Azithromycin) .Marland Kitchen... As directed  Complete Medication List: 1)  Prozac 20 Mg Caps (Fluoxetine hcl) .... 3 once daily 2)  Copaxone 20 Mg/ml Kit (Glatiramer acetate) .... 20 mg subcutaneously daily 3)  Carafate 1 Gm/23ml Susp (Sucralfate) .... One tablespoon three times a day and at bedtime 4)  First-mouthwash Blm Susp (Dph-lido-alhydr-mghydr-simeth) .... As needed 5)  Systane 0.4-0.3 % Soln (Polyethyl glycol-propyl glycol) .... Three times a day 6)  Analpram-hc 1-2.5 % Crea (Hydrocortisone ace-pramoxine) .... As needed for rectal bleeding, hemorrhoids 7)  Miralax Powd (Polyethylene glycol 3350) .... 3 to 4 times weekly 8)  Vivelle-dot 0.075 Mg/24hr Pttw (Estradiol) .... As directed 9)  Flexeril 10 Mg Tabs (Cyclobenzaprine hcl) .... Take 1 tablet by mouth once a day as needed 10)  Nexium 40 Mg Cpdr (Esomeprazole magnesium) .Marland Kitchen.. 1 capsule each day 30 minutes before meal 11)  Diphenoxylate-atropine 2.5-0.025 Mg Tabs (Diphenoxylate-atropine) .Marland Kitchen.. 1 by mouth q 6 hrs as needed cramps/diarrhea 12)  Prochlorperazine Maleate 10 Mg Tabs (Prochlorperazine maleate) .Marland Kitchen.. 1 q 6 hours as needed nausea 13)  Trazodone Hcl 50 Mg Tabs (Trazodone hcl) .... 1/2-1 by mouth q hs as needed insomnia 14)  Zithromax Z-pak 250 Mg Tabs (Azithromycin) .... As directed  Patient Instructions: 1)  Add Mucinex D as  needed . 2)  Please schedule a follow-up appointment as needed .  Prescriptions: ZITHROMAX Z-PAK 250 MG TABS (AZITHROMYCIN) as directed  #1 x 0   Entered and Authorized by:   Nelwyn Salisbury MD   Signed by:   Nelwyn Salisbury MD on 06/20/2009   Method used:   Electronically to        [facility]* (retail)       [address]       [address] Shipping/mailing       [place], [place]  [address]       Ph: [phone]       Fax: [phone]   RxID:   [id number]

## 2010-08-13 NOTE — Assessment & Plan Note (Signed)
Summary: CONSULT RE: ARTHITIS PAIN/CJR--Rm 13   Vital Signs:  Patient profile:   55 year old female Height:      65 inches Weight:      122 pounds BMI:     20.38 Temp:     99.0 degrees F oral Pulse rate:   72 / minute Pulse rhythm:   regular Resp:     16 per minute BP sitting:   100 / 70  (left arm) Cuff size:   regular  Vitals Entered By: Mervin Kung CMA Duncan Dull) (April 24, 2010 11:31 AM) CC: Rm 13  Pt states she has had right great toe and bilateral hand pain and swelling x 10 days. States she has history of arthritis. Is Patient Diabetic? No Pain Assessment Patient in pain? yes      Comments Pt stopped vivelle due to side effects. Stopped Amitiza due to low BP.  All other med doses and directions are correct. Mervin Kung CMA Duncan Dull)  April 24, 2010 11:50 AM    Primary Care Provider:  [name], MD  CC:  Rm 13  Pt states she has had right great toe and bilateral hand pain and swelling x 10 days. States she has history of arthritis.Marland Kitchen  History of Present Illness: bilateral hand pain for about 10 days she thinks associated with swelling she reports erythema at onset but those sxs have resolved she reports a.m. stiffness and notes difficulty with fine motor movements (placin IVs).     Current Problems (verified): 1)  Interstitial Cystitis  (ICD-595.1) 2)  Irritable Bowel Syndrome  (ICD-564.1) 3)  Unspecified Vitamin D Deficiency  (ICD-268.9) 4)  Hemorrhoids  (ICD-455.6) 5)  Anxiety  (ICD-300.00) 6)  Hx of Hypertension, Gestational  (ICD-642.90) 7)  Gerd  (ICD-530.81) 8)  Cyst, Ovarian Nec/nos  (ICD-620.2) 9)  Depression  (ICD-311)  Current Medications (verified): 1)  Carafate 1 Gm/59ml  Susp (Sucralfate) .... One Tablespoon Three Times A Day and At Bedtime 2)  First-Mouthwash Blm   Susp (Dph-Lido-Alhydr-Mghydr-Simeth) .Marland KitchenMarland KitchenMarland Kitchen 15-30 Cc Rinse and Swallow As Needed 3)  Systane 0.4-0.3 %  Soln (Polyethyl Glycol-Propyl Glycol) .... Three Times A Day 4)   Analpram-Hc 1-2.5 %  Crea (Hydrocortisone Ace-Pramoxine) .... As Needed For Rectal Bleeding, Hemorrhoids 5)  Miralax   Powd (Polyethylene Glycol 3350) .... Use As Needed/as Directed 6)  Vivelle-Dot 0.075 Mg/24hr  Pttw (Estradiol) .... As Directed 7)  Flexeril 10 Mg Tabs (Cyclobenzaprine Hcl) .... Take 1 Tablet By Mouth Once A Day As Needed 8)  Nexium 40 Mg  Cpdr (Esomeprazole Magnesium) .Marland Kitchen.. 1 Capsule Each Day 30 Minutes Before Meal 9)  Prochlorperazine Maleate 10 Mg Tabs (Prochlorperazine Maleate) .Marland Kitchen.. 1 Q 6 Hours As Needed Nausea 10)  Trazodone Hcl 50 Mg Tabs (Trazodone Hcl) .... 1/2-1 By Mouth Q Hs As Needed Insomnia 11)  Tussionex Pennkinetic Er 8-10 Mg/23ml Lqcr (Chlorpheniramine-Hydrocodone) .Marland Kitchen.. 1 Teaspoon At Bedtime As Needed Cough 12)  Zomig 5 Mg Tabs (Zolmitriptan) .... One Tablet Orally At The Time of Headache.  May Repeat X1 in One Hour If Headache Persists. 13)  Fish Oil 1000 Mg Caps (Omega-3 Fatty Acids) .... Once Daily 14)  Multivitamins  Tabs (Multiple Vitamin) .... Once Daily 15)  Ambien 10 Mg Tabs (Zolpidem Tartrate) .... Take 1 Tablet By Mouth At Bedtime As Needed 16)  Lorazepam 1 Mg Tabs (Lorazepam) .... Take One To Two As Needed Anxiety 17)  Venlafaxine Hcl 75 Mg Xr24h-Cap (Venlafaxine Hcl) .... Take 1 Tablet By Mouth Once A Day For  7 Days and Then 2 By Mouth Once Daily 18)  Dulcolax 5 Mg  Tbec (Bisacodyl) .Marland Kitchen.. 1-4 Once Daily As Needed 19)  Calcium Carbonate 600 Mg Tabs (Calcium Carbonate) .... Take 1200 Mg Daily 20)  Vitamin D3 2000iu .... Once Daily 21)  Amitiza 8 Mcg  Caps (Lubiprostone) .Marland Kitchen.. 1 Two Times A Day/take With Food and Water 22)  Magnesium 500 Mg Tabs (Magnesium) .... Take 1 Tablet By Mouth Once A Day. 23)  Estradiol 1 Mg Tabs (Estradiol) .... Take 1 Tablet By Mouth Once A Day.  Allergies: 1)  ! Pcn 2)  ! Phenergan 3)  ! Doxycycline 4)  ! Morphine Sulfate Cr (Morphine Sulfate)  Past History:  Past Medical History: Last updated: 12/14/2009 Multiple  Sclerosis DISPROVEN  Depression H/O Gestational Hypertension H/O Blood Transfusion, 3 units- 1991 GERD Anxiety Arthritis IBS-C Interstitial Cystitis   Past Surgical History: Last updated: 02/13/2007 Hysterectomy-endometriosis and fibroids-2007 Tonsillectomy S/P Removal of Morton's Neuroma  Family History: Last updated: 06/25/2007 mother alive with htn and lipids Family History High cholesterol Family History Hypertension Family History of CAD Female 1st degree relative <50 father but died of suicide Family History of Suicide attempt  one uncle with type 2 dm  Social History: Last updated: 02/11/2008 Occupation: Nurse [employer] Married 2 kids both healthy Never Smoked Alcohol use-no Regular exercise-no--too tired  Risk Factors: Exercise: no (09/30/2006)  Risk Factors: Smoking Status: never (11/14/2009)   Impression & Recommendations:  Problem # 1:  HAND PAIN (ICD-729.5)  ? cause doubt OA could be onset for RA check labs   Orders: T-Antinuclear Antib (ANA) ([id number]) T-Rheumatoid Factor [phone]) T- * Misc. Laboratory test [phone]) TLB-CRP-High Sensitivity (C-Reactive Protein) (86140-FCRP) Specimen Handling (91478) TLB-Sedimentation Rate (ESR) (85652-ESR)  Complete Medication List: 1)  Carafate 1 Gm/63ml Susp (Sucralfate) .... One tablespoon three times a day and at bedtime 2)  First-mouthwash Blm Susp (Dph-lido-alhydr-mghydr-simeth) .Marland KitchenMarland KitchenMarland Kitchen 15-30 cc rinse and swallow as needed 3)  Systane 0.4-0.3 % Soln (Polyethyl glycol-propyl glycol) .... Three times a day 4)  Analpram-hc 1-2.5 % Crea (Hydrocortisone ace-pramoxine) .... As needed for rectal bleeding, hemorrhoids 5)  Miralax Powd (Polyethylene glycol 3350) .... Use as needed/as directed 6)  Vivelle-dot 0.075 Mg/24hr Pttw (Estradiol) .... As directed 7)  Flexeril 10 Mg Tabs (Cyclobenzaprine hcl) .... Take 1 tablet by mouth once a day as needed 8)  Nexium 40 Mg Cpdr (Esomeprazole magnesium) .Marland Kitchen.. 1  capsule each day 30 minutes before meal 9)  Prochlorperazine Maleate 10 Mg Tabs (Prochlorperazine maleate) .Marland Kitchen.. 1 q 6 hours as needed nausea 10)  Trazodone Hcl 50 Mg Tabs (Trazodone hcl) .... 1/2-1 by mouth q hs as needed insomnia 11)  Tussionex Pennkinetic Er 8-10 Mg/39ml Lqcr (Chlorpheniramine-hydrocodone) .Marland Kitchen.. 1 teaspoon at bedtime as needed cough 12)  Zomig 5 Mg Tabs (Zolmitriptan) .... One tablet orally at the time of headache.  may repeat x1 in one hour if headache persists. 13)  Fish Oil 1000 Mg Caps (Omega-3 fatty acids) .... Once daily 14)  Multivitamins Tabs (Multiple vitamin) .... Once daily 15)  Ambien 10 Mg Tabs (Zolpidem tartrate) .... Take 1 tablet by mouth at bedtime as needed 16)  Lorazepam 1 Mg Tabs (Lorazepam) .... Take one to two as needed anxiety 17)  Venlafaxine Hcl 75 Mg Xr24h-cap (Venlafaxine hcl) .... Take 1 tablet by mouth once a day for 7 days and then 2 by mouth once daily 18)  Dulcolax 5 Mg Tbec (Bisacodyl) .Marland Kitchen.. 1-4 once daily as needed 19)  Calcium Carbonate 600 Mg  Tabs (Calcium carbonate) .... Take 1200 mg daily 20)  Vitamin D3 2000iu  .... Once daily 21)  Amitiza 8 Mcg Caps (Lubiprostone) .Marland Kitchen.. 1 two times a day/take with food and water 22)  Magnesium 500 Mg Tabs (Magnesium) .... Take 1 tablet by mouth once a day. 23)  Estradiol 1 Mg Tabs (Estradiol) .... Take 1 tablet by mouth once a day.  Other Orders: Venipuncture ([address])  Current Allergies (reviewed today): ! PCN ! PHENERGAN ! DOXYCYCLINE ! MORPHINE SULFATE CR (MORPHINE SULFATE)

## 2010-08-13 NOTE — Consult Note (Signed)
Summary: [facility]  [facility]   Imported By: Maryln Gottron 02/21/2008 14:02:35  _____________________________________________________________________  External Attachment:    Type:   Image     Comment:   External Document

## 2010-08-13 NOTE — Progress Notes (Signed)
Summary: persistent cough keeping her awake  Medications Added TUSSIONEX PENNKINETIC ER 8-10 MG/5ML LQCR (CHLORPHENIRAMINE-HYDROCODONE) 1 teaspoon at bedtime as needed cough       Phone Note Other Incoming   Summary of Call: Patient complains of dry cough keeping her up at night. Has tried Nyquil, advil sinus , Robitussin without benefit. No fevere, no SOB. Clear drainage from nose. Sxs x 3 days. Med list, PMH reviewed today.  PE: pulse 72 reg, RR 16 Pharynx clear, lungs clear cor S1S2 no murmurs.  Sounds like viral syndrome with viral URI  Plan:  if sputum changes color, fever, SOB, persistent sxs > 1 week follow-up PCP Iva Boop MD, [facility]  July 24, 2009 4:15 PM     New/Updated Medications: Sandria Senter ER 8-10 MG/5ML LQCR (CHLORPHENIRAMINE-HYDROCODONE) 1 teaspoon at bedtime as needed cough Prescriptions: Sandria Senter ER 8-10 MG/5ML LQCR (CHLORPHENIRAMINE-HYDROCODONE) 1 teaspoon at bedtime as needed cough  #125 cc x 0   Entered and Authorized by:   Iva Boop MD, [facility]   Signed by:   Iva Boop MD, [facility] on 07/24/2009   Method used:   Print then Give to Patient   RxID:   [id number]

## 2010-08-13 NOTE — Letter (Signed)
Summary: [facility]  [facility]   Imported By: Maryln Gottron 09/03/2009 13:58:46  _____________________________________________________________________  External Attachment:    Type:   Image     Comment:   External Document

## 2010-08-13 NOTE — Progress Notes (Signed)
Summary: Requesting UA, UTI symptoms  Phone Note Call from Patient Call back at Work Phone [phone]   Caller: Patient Call For: [name] MD Summary of Call: Pt has UTI symptoms, pain with urination, spasms.  Pt works at [employer], requesting Dr Cato Mulligan order UA at [employer]. [facility] Initial call taken by: Sid Falcon LPN,  June 05, 2008 4:12 PM  Follow-up for Phone Call        ua ok Follow-up by: [name] MD,  June 05, 2008 6:53 PM  Additional Follow-up for Phone Call Additional follow up Details #1::        Pt informed, [place] scheduled Additional Follow-up by: Sid Falcon LPN,  June 06, 2008 8:47 AM

## 2010-08-13 NOTE — Progress Notes (Signed)
Summary: cholesterol concerns  Phone Note Call from Patient Call back at Work Phone [phone]   Caller: Patient---voice mail Summary of Call: wants to discuss her cholesterol results, that she received from the wellness clinic. her reading was 208. Used to be 160. Please advise. Initial call taken by: Warnell Forester,  April 30, 2010 3:22 PM  Follow-up for Phone Call        i am not terribly concerned ...would depend some on risk factors and hdl we can discuss next OV Follow-up by: [name] MD,  April 30, 2010 8:31 PM  Additional Follow-up for Phone Call Additional follow up Details #1::        Left detailed message on pt's personal voice mail. Additional Follow-up by: Lynann Beaver CMA,  May 01, 2010 2:48 PM

## 2010-08-13 NOTE — Progress Notes (Signed)
Summary: no bowel movement x 5 days  Medications Added MIRALAX   POWD (POLYETHYLENE GLYCOL 3350) use as needed/as directed DULCOLAX 5 MG  TBEC (BISACODYL) 1-4 once daily as needed       Phone Note Other Incoming   Summary of Call: She has not moved her bowels in 5 days despite some MiraLax, enema, dulcolax x 4 and Mg Citrate. Bloated but no pain. venlaxifine started in past few weeks Heve recomended Miralax purge - 4 dulcolax followed by 608 glasses of Miralax and call back as needed if not effective Initial call taken by: Iva Boop MD, [facility],  Dec 07, 2009 12:30 PM    New/Updated Medications: MIRALAX   POWD (POLYETHYLENE GLYCOL 3350) use as needed/as directed DULCOLAX 5 MG  TBEC (BISACODYL) 1-4 once daily as needed Prescriptions: DULCOLAX 5 MG  TBEC (BISACODYL) 1-4 once daily as needed  #12 x prn   Entered and Authorized by:   Iva Boop MD, [facility]   Signed by:   Iva Boop MD, FACG on 12/07/2009   Method used:   Print then Give to Patient   RxID:   [id number] MIRALAX   POWD (POLYETHYLENE GLYCOL 3350) use as needed/as directed  #527 grams x prn   Entered and Authorized by:   Iva Boop MD, [facility]   Signed by:   Iva Boop MD, FACG on 12/07/2009   Method used:   Print then Give to Patient   RxID:   [id number]

## 2010-08-13 NOTE — Progress Notes (Signed)
Summary: sinus congestion and headache with post nasal drainage.  Phone Note Call from Patient   Caller: Patient Call For: [name] MD Summary of Call: Pt has symptoms of sinus infection and would like to be treated.  She cannot come in today. [facility]. [phone] Initial call taken by: Lynann Beaver CMA,  September 01, 2008 8:59 AM  Follow-up for Phone Call        call patient she how she is doing Follow-up by: [name] MD,  September 03, 2008 6:43 PM  Additional Follow-up for Phone Call Additional follow up Details #1::        LMTCB at work Additional Follow-up by: Lynann Beaver CMA,  September 04, 2008 8:22 AM    Additional Follow-up for Phone Call Additional follow up Details #2::    Still having sinus pressure and drainage (green)  Fevers up to 100, and vertigo is worse. Follow-up by: Lynann Beaver CMA,  September 04, 2008 8:34 AM  Additional Follow-up for Phone Call Additional follow up Details #3:: Details for Additional Follow-up Action Taken: see rx Additional Follow-up by: [name] MD,  September 04, 2008 2:54 PM  New/Updated Medications: CLARITHROMYCIN 500 MG  TABS (CLARITHROMYCIN) 1  by mouth 2 times daily   Prescriptions: CLARITHROMYCIN 500 MG  TABS (CLARITHROMYCIN) 1  by mouth 2 times daily  #20 x 0   Entered by:   Lynann Beaver CMA   Authorized by:   [name] MD   Signed by:   Lynann Beaver CMA on 09/04/2008   Method used:   Electronically to        [facility]* (retail)       [address]       [address] Shipping/mailing       [place], [place]  [address]       Ph: [phone]       Fax: [phone]   RxID:   [id number] CLARITHROMYCIN 500 MG  TABS (CLARITHROMYCIN) 1  by mouth 2 times daily  #20 x 0   Entered and Authorized by:   [name] MD   Signed by:   [name] MD on 09/04/2008   Method used:   Electronically to        [facility]  [place] [address] (retail)       [address] [place] [address]  [place], [place]  [address]       Ph: [phone] or [phone]       Fax: [phone]   RxID:   [id number]  Pt notified.

## 2010-08-13 NOTE — Progress Notes (Signed)
Summary: test results requested  Phone Note Call from Patient Call back at [phone]   Caller: Patient-voice mail Reason for Call: Lab or Test Results Summary of Call: requesting stress test results form last week Having some chest pain per Cardiology. Initial call taken by: Warnell Forester,  April 09, 2010 8:42 AM  Follow-up for Phone Call        call patient.  stress test normal unlikely that sxs are related to CAD check labs d-dimer---evaluate for PE Follow-up by: [name] MD,  April 09, 2010 1:27 PM  Additional Follow-up for Phone Call Additional follow up Details #1::        left message on machine for patient to return our call Additional Follow-up by: Kern Reap CMA Duncan Dull),  April 09, 2010 5:23 PM    Additional Follow-up for Phone Call Additional follow up Details #2::    I called work # and was notified that patient is off today. Left message on voicemail (cell) to call back to office. Lucious Groves CMA  April 10, 2010 10:20 AM   Patient notified and will come in today for d-dimer. Lucious Groves CMA  April 10, 2010 10:57 AM

## 2010-08-13 NOTE — Progress Notes (Signed)
Summary: new rx requested  Phone Note Call from Patient Call back at Home Phone [phone]   Caller: Patient----live call Summary of Call: Pt would like to have a new rx for zomig. She had samples before. please call [facility]. Initial call taken by: Warnell Forester,  September 07, 2009 9:52 AM  Follow-up for Phone Call        see rx Follow-up by: [name] MD,  September 10, 2009 6:59 AM    New/Updated Medications: ZOMIG 5 MG TABS (ZOLMITRIPTAN) one tablet orally at the time of headache.  May repeat x1 in one hour if headache persists. Prescriptions: ZOMIG 5 MG TABS (ZOLMITRIPTAN) one tablet orally at the time of headache.  May repeat x1 in one hour if headache persists.  #9 x 3   Entered and Authorized by:   [name] MD   Signed by:   [name] MD on 09/10/2009   Method used:   Electronically to        [facility]* (retail)       [address]       [address] Shipping/mailing       [place], [place]  [address]       Ph: [phone]       Fax: [phone]   RxID:   [id number]

## 2010-08-13 NOTE — Miscellaneous (Signed)
Summary: prior colonoscopy report  Clinical Lists Changes  Observations: Added new observation of COLONNXTDUE: 12/2014 (11/25/2007 16:10) Added new observation of COLONRECACT: Repeat colonoscopy in 10 years.   (12/24/2004 16:12) Added new observation of COLONOSCOPY:  Results: Hemorrhoids.     Location:  [facility].   (12/24/2004 16:12)       Colonoscopy  Procedure date:  12/24/2004  Findings:       Results: Hemorrhoids.     Location:  [facility].    Comments:      Repeat colonoscopy in 10 years.    Procedures Next Due Date:    Colonoscopy: 12/2014  Patient Name: Salvatore, Poe MRN: [id number] Procedure Procedures: Colonoscopy CPT: 40981.  Personnel: Endoscopist: Iva Boop, MD, [facility].  Exam Location: Exam performed in Endoscopy Suite. Outpatient  Patient Consent: Procedure, Alternatives, Risks and Benefits discussed, consent obtained,  Indications Symptoms: Constipation Abdominal pain / bloating. Change in bowel habits. Rectal Bleeding.  History  Current Medications: Patient is not currently taking Coumadin.  Comments: LLQ pain, constipation, rectal bleeding. Incomplete response to Zelnorm and fiber. Constipation better on Glycolax but still having LLQ pain (NuLev helps) with beef, citrus, heavy meals. Pre-Exam Physical: Performed Dec 24, 2004. Cardio-pulmonary exam, Rectal exam, HEENT exam , Abdominal exam, Mental status exam WNL.  Exam Exam: Extent of exam reached: Terminal Ileum, extent intended: Cecum.  The cecum was identified by appendiceal orifice and IC valve. Patient position: on left side. Colon retroflexion performed. Images taken. ASA Classification: I. Tolerance: excellent.  Monitoring: Pulse and BP monitoring, Oximetry used. Supplemental O2 given.  Colon Prep Prep results: excellent.  Fluoroscopy: Fluoroscopy was not used.  Sedation Meds: Patient assessed and found to be appropriate for moderate  (conscious) sedation. Fentanyl 50 mcg. given IV. Versed 6 mg. given IV.  Findings - NORMAL EXAM: Terminal Ileum to Sigmoid Colon.  HEMORRHOIDS: Internal and External. Size: Small. ICD9: Hemorrhoids, Internal and  External: 455.6.   Assessment Events  Diagnoses: 455.6: Hemorrhoids, Internal and  External.   Comments: SMALL, MIXED HEMORRHOIDS BUT NO OTHER ABNORMALITIES SEEN.  Unplanned Interventions: No intervention was required.  Plans Medication Plan: Continue current medications.  Patient Education: Patient given standard instructions for: Hemorrhoids. Patient instructed to get routine colonoscopy every 10 years.  Disposition: After procedure patient sent to recovery.  Scheduling/Referral: Follow-Up prn.

## 2010-08-13 NOTE — Progress Notes (Signed)
Summary: LMTCB 9-16  Phone Note Call from Patient Call back at [phone]   Caller: live Call For: Mikaele Stecher Summary of Call: Larey Seat down deck stairs last pm & landed hard on butt.  Painful & lots of bruising.  Should I have xray for fracture here at [facility] where I work? Initial call taken by: Rudy Jew, RN,  March 28, 2008 1:18 PM  Follow-up for Phone Call        ok to schedule xray of sacrum and coccyx Follow-up by: [name] MD,  March 29, 2008 5:44 AM  Additional Follow-up for Phone Call Additional follow up Details #1::        Called to see how she's doing & if she wants xray.  (yesterday he said no need to xray unless difficulty walking, that unlikely that she has fracture that anything would need to be done about & this was relayed to her & she said ok & call back as needed - all verbal that didn't get in message yesterday).  LMTCB Rudy Jew, RN  March 29, 2008 8:25 AM

## 2010-08-13 NOTE — Progress Notes (Signed)
Summary: UTI  Phone Note Call from Patient Call back at [phone]   Caller: PT LIVE Call For: SWORDS Summary of Call: PATIENT HAS ANOTHER UTI NEEDS A URINE CULTURE OR WHAT EVER YOU NEED TO DO,  [facility]. Initial call taken by: Celine Ahr,  November 02, 2008 2:10 PM  Follow-up for Phone Call        Appt scheduled with Dr. Clent Ridges. Follow-up by: Lynann Beaver CMA,  November 02, 2008 4:53 PM

## 2010-08-13 NOTE — Assessment & Plan Note (Signed)
Summary: rectal bleeding   History of Present Illness Visit Type: follow up Primary GI MD: Stan Head MD [facility] Primary Provider: [name], MD Chief Complaint: rectal bleeding History of Present Illness:   [name] has been having worsening constipation associated with LLQ pain and back pain. The pain is relieved by defecation. Using Miralx several days out of the week and rare stimulant laxatives. Does not take MirLax once daily due to urgent defecation and "overtreatment" probvlems at times. There is a rectal fullness and constant lower abdominal soreness as well. Also seeing bright red blood on toilet paper and without stool at times, also looks "gritty" sometimes. Has increased fiber and water.  She has had constipation and rectal bleeding in past and colonoscopy has shown hemorrhoids but change concerns her.             Prior Medications Reviewed Using: Patient Recall  Updated Prior Medication List: AMBIEN 10 MG TABS (ZOLPIDEM TARTRATE) 1/2 by mouth as needed for insomnia PROZAC 20 MG  CAPS (FLUOXETINE HCL) 3 once daily COPAXONE 20 MG/ML  KIT (GLATIRAMER ACETATE) subcutaneously daily CARAFATE 1 GM/10ML  SUSP (SUCRALFATE) one tablespoon tid FIRST-MOUTHWASH BLM   SUSP (DPH-LIDO-ALHYDR-MGHYDR-SIMETH) as needed SYSTANE 0.4-0.3 %  SOLN (POLYETHYL GLYCOL-PROPYL GLYCOL) three times a day ANALPRAM-HC 1-2.5 %  CREA (HYDROCORTISONE ACE-PRAMOXINE) as needed for rectal bleeding, hemorrhoids MIRALAX   POWD (POLYETHYLENE GLYCOL 3350) 3 to 4 times weekly VIVELLE-DOT 0.075 MG/24HR  PTTW (ESTRADIOL) as directed  Current Allergies (reviewed today): ! PCN ! PHENERGAN ! DOXYCYCLINE ! MORPHINE SULFATE CR (MORPHINE SULFATE)  Past Medical History:    Reviewed history from 02/13/2007 and no changes required:       Multiple Sclerosis       Depression       H/O Gestational Hypertension       H/O Blood Transfusion, 3 units- 1991       GERD       Anxiety       Arthritis  Past Surgical  History:    Reviewed history from 02/13/2007 and no changes required:       Hysterectomy-endometriosis and fibroids-2007       Tonsillectomy       S/P Removal of Morton's Neuroma   Family History:    Reviewed history from 06/25/2007 and no changes required:       mother alive with htn and lipids       Family History High cholesterol       Family History Hypertension       Family History of CAD Female 1st degree relative <50 father but died of suicide       Family History of Suicide attempt              one uncle with type 2 dm  Social History:    Reviewed history from 09/30/2006 and no changes required:       Occupation: Agricultural engineer       Married       2 kids both healthy       Never Smoked       Alcohol use-no       Regular exercise-no--too tired    Review of Systems       some urinary incontinence back pain, using stimulator MS is under treatment by [facility] neurologist   Vital Signs:  Patient Profile:   55 Years Old Female Height:     63 inches Weight:      120.13 pounds BMI:  21.36 Pulse rate:   72 / minute Pulse rhythm:   regular BP sitting:   112 / 68  (right arm)  Vitals Entered By: June McMurray CMA (February 11, 2008 3:10 PM)                  Physical Exam  General:     Well developed, well nourished, no acute distress. Abdomen:     Soft, nontender and nondistended. No masses, hepatosplenomegaly or hernias noted. Normal bowel sounds. Rectal:     female staff present Not much stool normal tone, ? irregular mucosa posterior wall, moderate rectocele some small external hemorrhoids    Impression & Recommendations:  Problem # 1:  RECTAL BLEEDING (ICD-569.3) Assessment: Deteriorated Has known hemorroids on 2006 colonoscopy but with abnormal rectal exam need to exclude other causes. Orders: Flex with Sedation (Flex w/Sed)   Problem # 2:  CONSTIPATION (ICD-564.00) Assessment: Deteriorated Having difficulty regulating with laxatives as can  get urgent diarrhea with daily MiraLax. Suspect due to MS and/or medications. Rectocele is present and may be a part of it. will exclude distal colonic or rectal problems.  Consider Amitiza vs. alternative MirAlx regimen +/- stimulant laxatives, also ? probiotic (Align).  Problem # 3:  ABNORMAL RCETAL EXAM (ICD-793.4) Assessment: New Likely just enlarged rectal folds but need to exclude other causes   Patient Instructions: 1)   [facility] Patient Information Guide given to patient.    ]  Appended Document: rectal bleeding She is still having chest burning and throat burning. MS drugs can cause an esophagitis. Will ass on EGD to eval chest pain and heartburn.  Appended Document: Orders Update    Clinical Lists Changes  Orders: Added new Test order of EGD (EGD) - Signed Added new Test order of Flex with Sedation (Flex w/Sed) - Signed

## 2010-08-13 NOTE — Progress Notes (Signed)
  Medications Added ACETAMINOPHEN-CODEINE #3 300-30 MG TABS (ACETAMINOPHEN-CODEINE) 1 tablet by mouth four times a day AMBIEN CR 12.5 MG TBCR (ZOLPIDEM TARTRATE) Take 1 tablet by mouth every night AMBIEN 10 MG TABS (ZOLPIDEM TARTRATE) Take 1 tablet by mouth at bedtime BUPROPION HCL (SMOKING DETER) 150 MG TB12 (BUPROPION HCL (SMOKING DETER)) Take 1 tablet by mouth twice a day FLUOXETINE HCL 20 MG CAPS (FLUOXETINE HCL) Take 3 capsule by mouth once a day PREDNISONE 10 MG TABS (PREDNISONE) Take tablet by mouth PROVENTIL HFA 108 (90 BASE) MCG/ACT AERS (ALBUTEROL SULFATE) Inhale 2 puff as directed four times a day PSEUDOVENT 400 120-400 MG CP12 (PSEUDOEPHEDRINE-GUAIFENESIN) Take 1 capsule by mouth twice a day TESSALON PERLES 100 MG CAPS (BENZONATATE) Take 1 capsule by mouth every six hours       Phone Note Call from Patient Call back at [phone]   Caller: Patient Call For: swords Summary of Call: pt had a bad rash. had to go to urgent care yesterday, has appt with Dr Cato Mulligan today at 4:30. Do i still need to keep appt today, since i was at urgent care yesterday. please call back. call cell phone. Initial call taken by: Calvert Cantor,  February 12, 2007 10:32 AM  Follow-up for Phone Call        Pt was put on prednisone pills, however feels she may also need a shot, rash kept her up all night.  Recommended pt keep her 4:30 appt today with Dr. Cato Mulligan. Pt voiced her understanding. Follow-up by: Sid Falcon LPN,  February 12, 2007 11:25 AM    New/Updated Medications: ACETAMINOPHEN-CODEINE #3 300-30 MG TABS (ACETAMINOPHEN-CODEINE) 1 tablet by mouth four times a day AMBIEN CR 12.5 MG TBCR (ZOLPIDEM TARTRATE) Take 1 tablet by mouth every night AMBIEN 10 MG TABS (ZOLPIDEM TARTRATE) Take 1 tablet by mouth at bedtime BUPROPION HCL (SMOKING DETER) 150 MG TB12 (BUPROPION HCL (SMOKING DETER)) Take 1 tablet by mouth twice a day FLUOXETINE HCL 20 MG CAPS (FLUOXETINE HCL) Take 3 capsule by mouth once a day  PREDNISONE 10 MG TABS (PREDNISONE) Take tablet by mouth PROVENTIL HFA 108 (90 BASE) MCG/ACT AERS (ALBUTEROL SULFATE) Inhale 2 puff as directed four times a day PSEUDOVENT 400 120-400 MG CP12 (PSEUDOEPHEDRINE-GUAIFENESIN) Take 1 capsule by mouth twice a day TESSALON PERLES 100 MG CAPS (BENZONATATE) Take 1 capsule by mouth every six hours

## 2010-08-13 NOTE — Progress Notes (Signed)
Summary: Diarrhea x 3 days, weak   Phone Note Call from Patient   Caller: Patient Summary of Call: [name] has 3 days of watery diarrhea that awakens her. Abd is sore (like I was kicked) and cramps. Stooling at night and not sleeping. She just got ampicillin (I see fro chart review) for TI and her dog has a parasite also. She needs: CBC and CMET, stool lactoferrin, C. diff and culture and ova and parasites (ask for real thing, she is immunosuppresed and has possible exposure)  If anyway she can get 1 L normal saline in office please do it, [name] will cover her in Endo room. Let her know I am on call and she should call me if needed. She can try loperamide as needed until we know more. Initial call taken by: Iva Boop MD,  September 29, 2008 12:51 PM  Follow-up for Phone Call        labs are entered in IDX, she was sent to lab.  20g IV  NS was started  left [facility] by Burman Foster, RN . Marland Kitchen  Will continue to monitor patient Darcey Nora RN  September 29, 2008 3:36 PM  patient was given a total of 1000 cc NS.  IV d/c'd catheter intact.  Patient  awake and alert.  Advised to notify Dr Leone Payor over the weekend if she has further problems Follow-up by: Darcey Nora RN,  September 29, 2008 3:56 PM  New Problems: DIARRHEA (ICD-787.91)   New Problems: DIARRHEA (ICD-787.91)

## 2010-08-13 NOTE — Miscellaneous (Signed)
Summary: ABD Ultrasound  Clinical Lists Changes  Orders: Added new Test order of Ultrasound Abdomen (UAS) - Signed

## 2010-08-13 NOTE — Assessment & Plan Note (Signed)
Summary: constipation/sheri    History of Present Illness Visit Type: Follow-up Visit Primary GI MD: Stan Head MD [facility] Primary Provider: [name], MD Chief Complaint: constipation History of Present Illness:   55 yo ww with IS-C.  Fklare of constipation began about a month ago med change Ron Jennette Kettle thinks she needs a laparoscopy, ? adhesions vs. recurrent endometriosis Two MiraLax purges only way she can defecate is if its liquid last BM was Tues, small amount is off carafate MiraLax two times a day and purges are being used presently  infraumbilical pain like a colic, present and a problem  similar to prior endometriosis pain not like IC or IBS pain tried a Levsin 2 weeks ago but not helpful   GI Review of Systems    Reports abdominal pain.     Location of  Abdominal pain: RLQ.    Denies acid reflux, belching, bloating, chest pain, dysphagia with liquids, dysphagia with solids, heartburn, loss of appetite, vomiting, vomiting blood, weight loss, and  weight gain.      Reports constipation.     Denies anal fissure, black tarry stools, change in bowel habit, diarrhea, diverticulosis, fecal incontinence, heme positive stool, hemorrhoids, irritable bowel syndrome, jaundice, light color stool, liver problems, rectal bleeding, and  rectal pain.    Current Medications (verified): 1)  Carafate 1 Gm/86ml  Susp (Sucralfate) .... One Tablespoon Three Times A Day and At Bedtime 2)  First-Mouthwash Blm   Susp (Dph-Lido-Alhydr-Mghydr-Simeth) .Marland KitchenMarland KitchenMarland Kitchen 15-30 Cc Rinse and Swallow As Needed 3)  Systane 0.4-0.3 %  Soln (Polyethyl Glycol-Propyl Glycol) .... Three Times A Day 4)  Analpram-Hc 1-2.5 %  Crea (Hydrocortisone Ace-Pramoxine) .... As Needed For Rectal Bleeding, Hemorrhoids 5)  Miralax   Powd (Polyethylene Glycol 3350) .... Use As Needed/as Directed 6)  Vivelle-Dot 0.075 Mg/24hr  Pttw (Estradiol) .... As Directed 7)  Flexeril 10 Mg Tabs (Cyclobenzaprine Hcl) .... Take 1 Tablet By Mouth  Once A Day As Needed 8)  Nexium 40 Mg  Cpdr (Esomeprazole Magnesium) .Marland Kitchen.. 1 Capsule Each Day 30 Minutes Before Meal 9)  Diphenoxylate-Atropine 2.5-0.025 Mg Tabs (Diphenoxylate-Atropine) .Marland Kitchen.. 1 By Mouth Q 6 Hrs As Needed Cramps/diarrhea 10)  Prochlorperazine Maleate 10 Mg Tabs (Prochlorperazine Maleate) .Marland Kitchen.. 1 Q 6 Hours As Needed Nausea 11)  Trazodone Hcl 50 Mg Tabs (Trazodone Hcl) .... 1/2-1 By Mouth Q Hs As Needed Insomnia 12)  Tussionex Pennkinetic Er 8-10 Mg/28ml Lqcr (Chlorpheniramine-Hydrocodone) .Marland Kitchen.. 1 Teaspoon At Bedtime As Needed Cough 13)  Zomig 5 Mg Tabs (Zolmitriptan) .... One Tablet Orally At The Time of Headache.  May Repeat X1 in One Hour If Headache Persists. 14)  Fish Oil 1000 Mg Caps (Omega-3 Fatty Acids) .... Once Daily 15)  Multivitamins  Tabs (Multiple Vitamin) .... Once Daily 16)  Ambien 10 Mg Tabs (Zolpidem Tartrate) .... Take 1 Tablet By Mouth At Bedtime As Needed 17)  Lorazepam 1 Mg Tabs (Lorazepam) .... Take One To Two As Needed Anxiety 18)  Venlafaxine Hcl 75 Mg Xr24h-Cap (Venlafaxine Hcl) .... Take 1 Tablet By Mouth Once A Day For 7 Days and Then 2 By Mouth Once Daily 19)  Dulcolax 5 Mg  Tbec (Bisacodyl) .Marland Kitchen.. 1-4 Once Daily As Needed 20)  Calcium Carbonate 600 Mg Tabs (Calcium Carbonate) .... Take 1200 Mg Daily 21)  Vitamin D3 2000iu .... Once Daily  Allergies (verified): 1)  ! Pcn 2)  ! Phenergan 3)  ! Doxycycline 4)  ! Morphine Sulfate Cr (Morphine Sulfate)  Past History:  Past Medical History: Multiple  Sclerosis DISPROVEN  Depression H/O Gestational Hypertension H/O Blood Transfusion, 3 units- 1991 GERD Anxiety Arthritis IBS-C Interstitial Cystitis   Past Surgical History: Reviewed history from 02/13/2007 and no changes required. Hysterectomy-endometriosis and fibroids-2007 Tonsillectomy S/P Removal of Morton's Neuroma  Family History: Reviewed history from 06/25/2007 and no changes required. mother alive with htn and lipids Family History High  cholesterol Family History Hypertension Family History of CAD Female 1st degree relative <50 father but died of suicide Family History of Suicide attempt  one uncle with type 2 dm  Social History: Reviewed history from 02/11/2008 and no changes required. 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 Eyes:  anicteric Ears:  R ear normal and L ear normal.   Abdomen:  soft, mildly tender suprapubc area Extremities:  no edema Psych:  Alert and cooperative. Normal mood and affect.   Impression & Recommendations:  Problem # 1:  CONSTIPATION (ICD-564.00) Assessment Deteriorated The time of the changes correlate with the inititiation of venlaxifine (Effexor) which may be the cause. Amitiza 8 ug bid  and reduce Effexor to 75/day  Problem # 2:  ABDOMINAL PAIN, LOWER (ICD-789.09) does not sound colonic she does have a cecum that indents her sigmoid colon as determined from prior flex sig and MR. ? if related but with endometriosis hx and her current complaints, seems more like GYN problem and a laparoscopy is sensible, I tgink.  Patient Instructions: 1)  Reduce venlaxafine to 1-75 mg capsule a day  2)  Try Amitiza 8 ug two times a day with food for constipation 3)  Continue MiraLax 4)  Let Dr. Elenore Paddy know how you are in 7-10 days. 5)  Copy sent to : Konrad Dolores, MD and Dr. Cato Mulligan 6)  The medication list was reviewed and reconciled.  All changed / newly prescribed medications were explained.  A complete medication list was provided to the patient /  caregiver.

## 2010-08-13 NOTE — Progress Notes (Signed)
Summary: UTI, UA order request  Phone Note Call from Patient Call back at Work Phone [phone]   Caller: Patient Call For: [name] MD Summary of Call: VM from pt reqesting UA order, pt thinks she has UTI, she would like an order to cath herself and have it checked at [facility] office.   Her phone [phone] Initial call taken by: Sid Falcon LPN,  June 14, 2010 11:39 AM  Follow-up for Phone Call        ok,  clean catch urine would probably be ok Follow-up by: [name] MD,  June 14, 2010 12:16 PM  Additional Follow-up for Phone Call Additional follow up Details #1::        order put in Additional Follow-up by: Alfred Levins, CMA,  June 14, 2010 2:02 PM  New Problems: UTI (ICD-599.0)   New Problems: UTI (ICD-599.0)

## 2010-08-13 NOTE — Procedures (Signed)
Summary: EGD   EGD  Procedure date:  04/01/2006  Findings:      Findings: Normal  Location: [facility]   Patient Name: [name], [name] MRN:  Procedure Procedures: Panendoscopy (EGD) CPT: 43235.    with biopsy(s)/brushing(s). CPT: D1846139.  Personnel: Endoscopist: Rachael Fee, MD.  Exam Location: Exam performed in Endoscopy Suite. Outpatient  Patient Consent: Procedure, Alternatives, Risks and Benefits discussed, consent obtained, from patient. Consent was obtained by the RN.  Indications Symptoms: Reflux symptoms  Comments: [id number] History  Current Medications: Patient is not currently taking Coumadin.  Comments: Patient history reviewed and updated, pre-procedure physical performed prior to initiation of sedation? yes Pre-Exam Physical: Performed Apr 01, 2006  Cardio-pulmonary exam, Abdominal exam, Mental status exam WNL.  Exam Exam Info: Maximum depth of insertion Duodenum, intended Duodenum. Patient position: on left side. Gastric retroflexion performed. Images taken. ASA Classification: II. Tolerance: good.  Sedation Meds: Patient assessed and found to be appropriate for moderate (conscious) sedation. Fentanyl 50 mcg. given IV. Versed 5 mg. given IV. Cetacaine Spray 2 sprays given aerosolized.  Monitoring: BP and pulse monitoring done. Oximetry used. Supplemental O2 given  Findings - Normal: Proximal Esophagus to Duodenal 2nd Portion. Comments: Biopsies taken from antrum and fundus (per study protocol).   Assessment Normal examination. Comments: Normal. Events  Unplanned Intervention: No unplanned interventions were required.  Unplanned Events: There were no complications. Plans Comments: She will continue daily PPI (20-30 minutes prior to a meal) to help her chronic pyrosis.  This report was created from the original endoscopy report, which was reviewed and signed by the above listed endoscopist.

## 2010-08-13 NOTE — Procedures (Signed)
Summary: Colonoscopy   Colonoscopy  Procedure date:  12/24/2004  Findings:      Results: Hemorrhoids.     Location:  [facility].    Procedures Next Due Date:    Colonoscopy: 12/2014 Patient Name: [name], [name] MRN: [id number] Procedure Procedures: Colonoscopy CPT: 13086.  Personnel: Endoscopist: Iva Boop, MD, [facility].  Exam Location: Exam performed in Endoscopy Suite. Outpatient  Patient Consent: Procedure, Alternatives, Risks and Benefits discussed, consent obtained,  Indications Symptoms: Constipation Abdominal pain / bloating. Change in bowel habits. Rectal Bleeding.  History  Current Medications: Patient is not currently taking Coumadin.  Comments: LLQ pain, constipation, rectal bleeding. Incomplete response to Zelnorm and fiber. Constipation better on Glycolax but still having LLQ pain (NuLev helps) with beef, citrus, heavy meals. Pre-Exam Physical: Performed Dec 24, 2004. Cardio-pulmonary exam, Rectal exam, HEENT exam , Abdominal exam, Mental status exam WNL.  Exam Exam: Extent of exam reached: Terminal Ileum, extent intended: Cecum.  The cecum was identified by appendiceal orifice and IC valve. Patient position: on left side. Colon retroflexion performed. Images taken. ASA Classification: I. Tolerance: excellent.  Monitoring: Pulse and BP monitoring, Oximetry used. Supplemental O2 given.  Colon Prep Prep results: excellent.  Fluoroscopy: Fluoroscopy was not used.  Sedation Meds: Patient assessed and found to be appropriate for moderate (conscious) sedation. Fentanyl 50 mcg. given IV. Versed 6 mg. given IV.  Findings - NORMAL EXAM: Terminal Ileum to Sigmoid Colon.  HEMORRHOIDS: Internal and External. Size: Small. ICD9: Hemorrhoids, Internal and  External: 455.6.   Assessment  Diagnoses: 455.6: Hemorrhoids, Internal and  External.   Comments: SMALL, MIXED HEMORRHOIDS BUT NO OTHER ABNORMALITIES SEEN. Events  Unplanned  Interventions: No intervention was required.  Plans Medication Plan: Continue current medications.  Patient Education: Patient given standard instructions for: Hemorrhoids. Patient instructed to get routine colonoscopy every 10 years.  Disposition: After procedure patient sent to recovery.  Scheduling/Referral: Follow-Up prn.

## 2010-08-13 NOTE — Progress Notes (Signed)
Summary: UA result request  Phone Note Call from Patient Call back at Work Phone [phone]   Caller: Patient Call For: [name] MD Summary of Call: Pt called for UA result from last week, pt still experiencing spasms.  Pt concerned she heard nothing.  Informed UA was neg. Initial call taken by: Sid Falcon LPN,  June 19, 2008 1:01 PM  Follow-up for Phone Call        if still having sxs have her get urine culture---can be done through [facility]  code 595.00 Follow-up by: [name] MD,  June 19, 2008 4:20 PM  Additional Follow-up for Phone Call Additional follow up Details #1::        Pt informed, she is going to wait and see if the intermittent spasms may be related to her MS.  Pt will call back if she wants urine culture ordered. Additional Follow-up by: Sid Falcon LPN,  June 19, 2008 4:40 PM

## 2010-08-13 NOTE — Assessment & Plan Note (Signed)
Summary: blood sugar issues/mhf  Medications Added XANAX 0.5 MG  TABS (ALPRAZOLAM) as directed as needed CARAFATE 1 GM/10ML  SUSP (SUCRALFATE) one tablespoon qid FIRST-MOUTHWASH BLM   SUSP (DPH-LIDO-ALHYDR-MGHYDR-SIMETH) as needed * DACLOFEN 100MG  as needed SYSTANE 0.4-0.3 %  SOLN (POLYETHYL GLYCOL-PROPYL GLYCOL) three times a day * LOTRAN EYE GTTS as needed irritation        Vital Signs:  Patient Profile:   55 Years Old Female Temp:     98.2 degrees F Pulse rate:   68 / minute BP sitting:   112 / 76  (left arm)  Vitals Entered By: Gladis Riffle, RN (June 25, 2007 5:02 PM)                 Chief Complaint:  c/o blood sugar issues--CBG 40 and 52 at times associated with nausea and tiredness X 2 weeks.  History of Present Illness: 2-3 weeks of vague weakness, nausea, fatigue. She has checked Cbgs during this time---#s 42-52 during these spells.   Only new meds are carafate and duke's mouthwash 2- 4 times daily for mouth ulcers.   Current Allergies (reviewed today): ! PCN ! PHENERGAN ! DOXYCYCLINE   Family History:    Reviewed history from 09/30/2006 and no changes required:       mother alive with htn and lipids       Family History High cholesterol       Family History Hypertension       Family History of CAD Female 1st degree relative <50 father but died of suicide       Family History of Suicide attempt              one uncle with type 2 dm    Review of Systems       no other complaints in a complete ROS    Physical Exam  General:     Well-developed,well-nourished,in no acute distress; alert,appropriate and cooperative throughout examination Head:     normocephalic and atraumatic.   Neck:     No deformities, masses, or tenderness noted. Chest Wall:     No deformities, masses, or tenderness noted. Lungs:     Normal respiratory effort, chest expands symmetrically. Lungs are clear to auscultation, no crackles or wheezes.    Impression &  Recommendations:  Problem # 1:  HYPOGLYCEMIA, UNSPECIFIED (ICD-251.2) by history protein with each meal if sxs persist she will need evaluation for hypoglycemia  Complete Medication List: 1)  Ambien 10 Mg Tabs (Zolpidem tartrate) .... 1/2 by mouth as needed for insomnia 2)  Prozac 20 Mg Caps (Fluoxetine hcl) .... One tid 3)  Copaxone 20 Mg/ml Kit (Glatiramer acetate) .... Subcutaneously daily 4)  Hydroxyzine Hcl 25 Mg Tabs (Hydroxyzine hcl) .... Three times a day as needed prn 5)  Xanax 0.5 Mg Tabs (Alprazolam) .... As directed as needed 6)  Carafate 1 Gm/36ml Susp (Sucralfate) .... One tablespoon qid 7)  First-mouthwash Blm Susp (Dph-lido-alhydr-mghydr-simeth) .... As needed 8)  Daclofen 100mg   .... As needed 9)  Systane 0.4-0.3 % Soln (Polyethyl glycol-propyl glycol) .... Three times a day 10)  Lotran Eye Gtts  .... As needed irritation     ]

## 2010-08-13 NOTE — Procedures (Signed)
Summary: EGD   EGD  Procedure date:  03/07/2008  Findings:      Location: [facility]    Patient Name: [name], [name] MRN: [id number] Procedure Procedures: Panendoscopy (EGD) CPT: 43235.    with biopsy(s)/brushing(s). CPT: D1846139.  Personnel: Endoscopist: Iva Boop, MD, [facility].  Exam Location: Exam performed in Outpatient Clinic. Outpatient  Patient Consent: Procedure, Alternatives, Risks and Benefits discussed, consent obtained, from patient. Consent was obtained by the RN.  Indications Symptoms: Chest Pain. Abdominal pain, location: epigastric.  History  Current Medications: Patient is not currently taking Coumadin.  Allergies: Patient is allergic to PENICILLIN, MORPHINE, DOXYCYCLINE, PHENERGAN.  Pre-Exam Physical: Performed Mar 07, 2008  Performed Apr 01, 2006  Cardio-pulmonary exam, HEENT exam, Abdominal exam, Mental status exam WNL.  Comments: Pt. history reviewed/updated, physical exam performed prior to initiation of sedation? YES Exam Exam Info: Maximum depth of insertion Duodenum, intended Duodenum. Patient position: on left side. Gastric retroflexion performed. Images taken. ASA Classification: II. Tolerance: excellent.  Sedation Meds: Patient assessed and found to be appropriate for moderate (conscious) sedation. Fentanyl 50 mcg. given IV. Versed 7 mg. given IV. Cetacaine Spray 2 sprays given aerosolized. Benadryl 25 given IV.  Monitoring: BP and pulse monitoring done. Oximetry used. Supplemental O2 given  Findings - Normal: Proximal Esophagus to Mid Esophagus.  - ESOPHAGEAL INFLAMMATION: suspected as a result of reflux. Proximal margin 39 cm from mouth,  distal margin 40 cm. Length of inflammation: 1 cm. Los [place] Classification: Grade B. Biopsy/Esoph Inflamtn taken.  - Normal: Fundus to Body.  - MUCOSAL ABNORMALITY: Duodenal Bulb to Duodenal Apex. Erosions present. Erythematous mucosa. Biopsy/Mucosal Abn taken.  - MUCOSAL  ABNORMALITY: Antrum. Erosions present. Erythematous mucosa. taken. Comment: several erosions.  - Normal: Duodenal 2nd Portion.   Assessment  Comments: 1) DISTAL ESOPHAGITIS 2) ANTRAL GASTRITIS 3) DUODENITIS 4) OTHERWISE NORMAL  COPAXONE MAY BE CAUSE OF 1-3 Events  Unplanned Intervention: No unplanned interventions were required.  Plans Medication(s): Await pathology. Continue current medications.  Disposition: After procedure patient sent remain in endo suite for second procedure.    REPORT OF SURGICAL PATHOLOGY   Case #: [id number] Patient Name: [name], [name]. Office Chart Number:  [id number]   MRN: [id number] Pathologist: H. Hollice Espy, MD DOB/Age  [birth year] (Age: 55)    Gender: F Date Taken:  03/07/2008 Date Received: 03/08/2008   FINAL DIAGNOSIS   ***MICROSCOPIC EXAMINATION AND DIAGNOSIS***   1.  DUODENUM, BIOPSY:  BENIGN SMALL BOWEL MUCOSA.  NO VILLOUS ATROPHY, INFLAMMATION OR OTHER ABNORMALITIES PRESENT.   2.  STOMACH, ANTRUM, BIOPSY:  MINIMAL CHRONIC GASTRITIS, NO EVIDENCE OF HELICOBACTER PYLORI, INTESTINAL METAPLASIA, DYSPLASIA OR MALIGNANCY IDENTIFIED.    3.  ESOPHAGUS, DISTAL, BIOPSY:  GASTROESOPHAGEAL JUNCTION MUCOSA WITH MILD INFLAMMATION CONSISTENT WITH GASTROESOPHAGEAL REFLUX.  NO INTESTINAL METAPLASIA, DYSPLASIA OR MALIGNANCY IDENTIFIED.   COMMENT 1.  There is small bowel mucosa with normal villous architecture and no objective increase in inflammation.  No villous atrophy, active inflammation or other significant changes identified.   2.  A Warthin-Starry stain is performed to determine the possibility of the presence of Helicobacter pylori. The Warthin-Starry stain is negative for organisms of Helicobacter pylori. The control(s) stained appropriately.   3. An Alcian Blue stain is performed to determine the presence of intestinal metaplasia (goblet cell metaplasia). No intestinal metaplasia (goblet cell metaplasia) is identified  with the Alcian Blue stain. The control stained appropriately.  A PAS stain for fungal organism was performed and the stain is negative.  Controls  stain appropriately.  (HCL:mj 03/09/08)   mj Date Reported:  03/09/2008     H. Hollice Espy, MD *** Electronically Signed [facility] ***   Clinical information 1. duodenal erosion 2. gastritis 3. esophagitis/? Barrett' s (mw)   specimen(s) obtained 1: Duodenum, biopsy 2: Stomach, biopsy, antrum 3: Esophagus, biopsy, distal   Gross Description 1.  Received in formalin are tan, soft tissue fragments that are submitted in toto.   Number:  four. Size:  each 0.3 cm.   2.  Received in formalin are tan, soft tissue fragments that are submitted in toto.  Number:  three Size:  each 0.3 cm   3.  Received in formalin are tan, soft tissue fragments that are submitted in toto.   Number:  four. Size:  0.2 to 0.4 cm.  (GP:gt, 03/08/08)   gdt/     Signed by Iva Boop MD on 03/10/2008 at 5:10 PM  ________________________________________________________________________ no recall or leter I reviewed results with her in person   Signed by Iva Boop MD on 03/10/2008 at 5:10 PM   This report was created from the original endoscopy report, which was reviewed and signed by the above listed endoscopist.

## 2010-08-13 NOTE — Progress Notes (Signed)
Summary: sore mouth/GERD  Medications Added NEXIUM 40 MG  CPDR (ESOMEPRAZOLE MAGNESIUM) 1 capsule each day 30 minutes before meal DEXILANT 60 MG CPDR (DEXLANSOPRAZOLE) 1 by mouth 30 mins before breakfast       Phone Note Other Incoming   Summary of Call: mouth and toungue are irritated with some ulcers. Throat sore and reflux/heartburn flaring. Using carafate, Nexium two times a day and magic moutrhwash (only at night - due to drooling)  had been off Nexium x few months will try Dexilant samples 60 mg daily instead of Nexium and use Magic mouthwash more  I did see her tongue and there are a few superficial ulcers on tongue. Pharynx clear. Initial call taken by: Iva Boop MD, Clementeen Graham,  February 06, 2010 4:25 PM    New/Updated Medications: NEXIUM 40 MG  CPDR (ESOMEPRAZOLE MAGNESIUM) 1 capsule each day 30 minutes before meal DEXILANT 60 MG CPDR (DEXLANSOPRAZOLE) 1 by mouth 30 mins before breakfast

## 2010-08-13 NOTE — Progress Notes (Signed)
Summary: raging UTI / requests callin med after UA at [facility]  Phone Note Call from Patient Call back at [facility] Phone [phone] Call back at Work Phone [phone]   Summary of Call: Has raging UTI.  Has appt Dr. Oneal Grout.  Wants to get UA/C & S at [facility] lab, works there.  Requests med callin as needed results.   Initial call taken by: Rudy Jew, RN,  April 22, 2010 1:18 PM  Follow-up for Phone Call        ok to get labs at [facility] Follow-up by: [name] MD,  April 22, 2010 3:27 PM  Additional Follow-up for Phone Call Additional follow up Details #1::        Phone Call Completed Additional Follow-up by: Rudy Jew, RN,  April 22, 2010 3:38 PM

## 2010-08-13 NOTE — Progress Notes (Signed)
Summary: Nexium Rx  Medications Added NEXIUM 40 MG  CPDR (ESOMEPRAZOLE MAGNESIUM) 1 capsule each day 30 minutes before meal       Phone Note Call from Patient   Summary of Call: Havinng increased esophageal burning and requests Nexium refill. Has GERD and med side effects. Will Rx Nexium but need to ask which [facility] pharmacy she wants to use Can also give her some more samples Initial call taken by: Iva Boop MD,  August 08, 2008 5:44 PM  Follow-up for Phone Call        Left message for patient to call back Darcey Nora RN  August 09, 2008 8:44 AM  spoke with patient rx sent to [facility] pharmacy, for 90 day supply Follow-up by: Darcey Nora RN,  August 10, 2008 8:27 AM    New/Updated Medications: NEXIUM 40 MG  CPDR (ESOMEPRAZOLE MAGNESIUM) 1 capsule each day 30 minutes before meal   Prescriptions: NEXIUM 40 MG  CPDR (ESOMEPRAZOLE MAGNESIUM) 1 capsule each day 30 minutes before meal  #90 x 3   Entered by:   Darcey Nora RN   Authorized by:   Iva Boop MD   Signed by:   Darcey Nora RN on 08/10/2008   Method used:   Electronically to        [facility]* (retail)       [address]       [address] Shipping/mailing       [place], [place]  [address]       Ph: [phone]       Fax: [phone]   RxID:   [id number]

## 2010-08-13 NOTE — Letter (Signed)
Summary: [facility]  [facility]   Imported By: Maryln Gottron 07/27/2009 08:38:56  _____________________________________________________________________  External Attachment:    Type:   Image     Comment:   External Document

## 2010-08-13 NOTE — Progress Notes (Signed)
Summary: Schedule MRI   Phone Note Outgoing Call   Call placed to: Patient Summary of Call: pt notified of MRI [facility] Suite 03-16-08 3:00 to arrive 2:45, NPO x 4 hours Initial call taken by: Darcey Nora RN,  March 14, 2008 3:28 PM  New Problems: ABNORMAL RECTAL EXAM (ICD-793.4)   New Problems: ABNORMAL RECTAL EXAM (ICD-793.4)

## 2010-08-13 NOTE — Progress Notes (Signed)
Summary: sinus infection  Phone Note Call from Patient   Caller: Patient Call For: [name] MD Summary of Call: Pt is calling requesting an antibiotic for a sinus infection (green drainage, with headache and congestion ) [facility] [phone] Initial call taken by: Lynann Beaver CMA,  July 26, 2009 1:41 PM  Follow-up for Phone Call        see rx  Additional Follow-up for Phone Call Additional follow up Details #1::        Left message to inform H#,  ither # siad she's already gone for the day.   Additional Follow-up by: Rudy Jew, RN,  July 27, 2009 2:34 PM    New/Updated Medications: AZITHROMYCIN 250 MG  TABS (AZITHROMYCIN) 2 by  mouth today and then 1 daily for 4 days Prescriptions: AZITHROMYCIN 250 MG  TABS (AZITHROMYCIN) 2 by  mouth today and then 1 daily for 4 days  #6 x 0   Entered and Authorized by:   [name] MD   Signed by:   [name] MD on 07/27/2009   Method used:   [facility]  [place] [address] [facility]       [address]       [place], [place]  [address]       Ph: [phone] or [phone]       Fax: [phone]   RxID:   [id number]

## 2010-08-13 NOTE — Assessment & Plan Note (Signed)
Summary: ?uti/pt didnt want a sooner appt and wanted pm appt/cjr   Vital Signs:  Patient profile:   55 year old female Weight:      119 pounds Temp:     98.3 degrees F oral Pulse rate:   60 / minute BP sitting:   108 / 72  (left arm) Cuff size:   regular  Vitals Entered By: Army Fossa CMA (February 13, 2009 4:25 PM) CC: possible UTI, lower abdomen pain, burning.   History of Present Illness: Here with what she thinks is another UTI. We saw her here in April for a UTI which grew an Enterococcus species on culture. She seemed to recover after a course of Macrobid. She had seen Dr. Lynnae Sandhoff in the past for Urologic concerns, but she saw Dr. Karoline Caldwell in [place] on 12-04-08 for a second opinion. A urinalysis on a catheterized sample was clear. They then discussed doing a cystoscopy, but she did not return to see him. She has actually made an appt. to see Dr. 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 For the past 3 days she has felt lower abdominal pressure, urgency to urinate, and some burning on urinations. No fever or nausea. she drinks plenty of water.   Allergies: 1)  ! Pcn 2)  ! Phenergan 3)  ! Doxycycline 4)  ! Morphine Sulfate Cr (Morphine Sulfate)  Past History:  Past Medical History: Reviewed history from 02/11/2008 and no changes required. Multiple Sclerosis Depression H/O Gestational Hypertension H/O Blood Transfusion, 3 units- 1991 GERD Anxiety Arthritis  Past Surgical History: Reviewed history from 02/13/2007 and no changes required. Hysterectomy-endometriosis and fibroids-2007 Tonsillectomy S/P Removal of Morton's Neuroma  Review of Systems  The patient denies anorexia, fever, weight loss, weight gain, vision loss, decreased hearing, hoarseness, chest pain, syncope, dyspnea on exertion, peripheral edema, prolonged cough, headaches, hemoptysis, melena, hematochezia,  severe indigestion/heartburn, hematuria, incontinence, genital sores, muscle weakness, suspicious skin lesions, transient blindness, difficulty walking, depression, unusual weight change, abnormal bleeding, enlarged lymph nodes, angioedema, breast masses, and testicular masses.    Physical Exam  General:  Well-developed,well-nourished,in no acute distress; alert,appropriate and cooperative throughout examination Abdomen:  Bowel sounds positive,abdomen soft and non-tender without masses, organomegaly or hernias noted.   Impression & Recommendations:  Problem # 1:  UTI (ICD-599.0)  Her updated medication list for this problem includes:    Macrobid 100 Mg Caps (Nitrofurantoin monohyd macro) .Marland Kitchen..Marland Kitchen Two times a day  Orders: T-Culture, Urine ([id number]) UA Dipstick w/o Micro (manual) (09811)  Complete Medication List: 1)  Prozac 20 Mg Caps (Fluoxetine hcl) .... 3 once daily 2)  Copaxone 20 Mg/ml Kit (Glatiramer acetate) .... 20 mg subcutaneously daily 3)  Carafate 1 Gm/14ml Susp (Sucralfate) .... One tablespoon tid 4)  First-mouthwash Blm Susp (Dph-lido-alhydr-mghydr-simeth) .... As needed 5)  Systane 0.4-0.3 % Soln (Polyethyl glycol-propyl glycol) .... Three times a day 6)  Analpram-hc 1-2.5 % Crea (Hydrocortisone ace-pramoxine) .... As needed for rectal bleeding, hemorrhoids 7)  Miralax Powd (Polyethylene glycol 3350) .... 3 to 4 times weekly 8)  Vivelle-dot 0.075 Mg/24hr Pttw (Estradiol) .... As directed 9)  Flexeril 10 Mg Tabs (Cyclobenzaprine hcl) .... Take 1 tablet by mouth once a day as needed 10)  Nexium 40 Mg Cpdr (Esomeprazole magnesium) .Marland Kitchen.. 1 capsule each day 30 minutes before meal 11)  Diphenoxylate-atropine 2.5-0.025 Mg Tabs (Diphenoxylate-atropine) .Marland Kitchen.. 1 by mouth q 6 hrs as needed cramps/diarrhea 12)  Prochlorperazine Maleate  10 Mg Tabs (Prochlorperazine maleate) .Marland Kitchen.. 1 q 6 hours as needed nausea 13)  Trazodone Hcl 50 Mg Tabs (Trazodone hcl) .... 1/2-1 by mouth q hs as  needed insomnia 14)  Macrobid 100 Mg Caps (Nitrofurantoin monohyd macro) .... Two times a day  Patient Instructions: 1)  Treat with Macrobid again. Culture the urine again. Keep the appt. with Dr. Beverely Pace. Prescriptions: MACROBID 100 MG CAPS (NITROFURANTOIN MONOHYD MACRO) two times a day  #20 x 0   Entered and Authorized by:   Nelwyn Salisbury MD   Signed by:   Nelwyn Salisbury MD on 02/13/2009   Method used:   [facility]  [place] [address]       [address]       [place], [place]  [address]       Ph: [phone] or [phone]       Fax: [phone]   RxID:   [id number]   Laboratory Results   Urine Tests    Routine Urinalysis   Color: yellow Appearance: Clear Glucose: negative   (Normal Range: Negative) Bilirubin: negative   (Normal Range: Negative) Ketone: negative   (Normal Range: Negative) Spec. Gravity: 1.010   (Normal Range: 1.003-1.035) Blood: negative   (Normal Range: Negative) pH: 6.0   (Normal Range: 5.0-8.0) Protein: trace   (Normal Range: Negative) Urobilinogen: 0.2   (Normal Range: 0-1) Nitrite: negative   (Normal Range: Negative) Leukocyte Esterace: negative   (Normal Range: Negative)    Comments: Army Fossa CMA  February 13, 2009 4:29 PM

## 2010-08-13 NOTE — Progress Notes (Signed)
Summary: nuc pre procedure  Phone Note Outgoing Call Call back at [facility] Phone [phone] Call back at Work Phone [phone]   Call placed by: Cathlyn Parsons RN,  April 02, 2010 3:54 PM Call placed to: Patient Reason for Call: Confirm/change Appt Summary of Call: Reviewed information on Myoview Information Sheet (see scanned document for further details).  Spoke with patient.      Nuclear Med Background Indications for Stress Test: Evaluation for Ischemia, Post Hospital  Indications Comments: 04/01/10 [facility] ER with chest pressure/diaphoresis and neg enzymes.  History: Myocardial Perfusion Study  History Comments: MS 06 MPS:NL  Symptoms: Chest Pressure, Diaphoresis    Nuclear Pre-Procedure Cardiac Risk Factors: Family History - CAD Height (in): 65

## 2010-08-13 NOTE — Letter (Signed)
Summary: [facility]  [facility]   Imported By: Maryln Gottron 03/16/2009 10:26:47  _____________________________________________________________________  External Attachment:    Type:   Image     Comment:   External Document

## 2010-08-13 NOTE — Progress Notes (Signed)
Summary: staph exposure  Phone Note Call from Patient   Caller: Patient Call For: [name] MD Summary of Call: Pt and her husband [name]) were exposed to staph from their dog.  Vet suggested they both should be treated with antibiotics. 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 Additional Follow-up by: Lynann Beaver CMA,  September 21, 2009 2:41 PM

## 2010-08-13 NOTE — Progress Notes (Signed)
Summary: Conversation re: rectal bleeding  Medications Added KAPIDEX 60 MG  CPDR (DEXLANSOPRAZOLE) Take one capsule by mouth 30 minutes before breakfast ANALPRAM-HC 1-2.5 %  CREA (HYDROCORTISONE ACE-PRAMOXINE) as needed for rectal bleeding, hemorrhoids       Phone Note Call from Patient   Caller: Patient Call For: Stan Head, MD Summary of Call: patient spoke to me at work having some epigastric buring and straks of bright red blood on stool x 3 days, no other GI symptoms reported she has known IBS and I think that is what is going on, along with known hemorrhoids bleeding I have given her samples of Kapidex 60 mg daily for epigastric burning and Analpram HC for hemorrhoids She knows that if not better in 1-2 weeks to let me know and would need to see her in exam setting.  Initial call taken by: Iva Boop MD,  Nov 25, 2007 5:33 PM    New/Updated Medications: KAPIDEX 60 MG  CPDR (DEXLANSOPRAZOLE) Take one capsule by mouth 30 minutes before breakfast ANALPRAM-HC 1-2.5 %  CREA (HYDROCORTISONE ACE-PRAMOXINE) as needed for rectal bleeding, hemorrhoids

## 2010-08-13 NOTE — Progress Notes (Signed)
Summary: mouthwash refill  Medications Added FIRST-MOUTHWASH BLM   SUSP (DPH-LIDO-ALHYDR-MGHYDR-SIMETH) 15-30 cc rinse and swallow as needed       Phone Note Call from Patient   Summary of Call: refill of mouthwash requested    New/Updated Medications: FIRST-MOUTHWASH BLM   SUSP (DPH-LIDO-ALHYDR-MGHYDR-SIMETH) 15-30 cc rinse and swallow as needed Prescriptions: FIRST-MOUTHWASH BLM   SUSP (DPH-LIDO-ALHYDR-MGHYDR-SIMETH) 15-30 cc rinse and swallow as needed  #300cc x 1   Entered and Authorized by:   Iva Boop MD, [facility]   Signed by:   Iva Boop MD, FACG on 10/09/2009   Method used:   Electronically to        [facility]* (retail)       [address]       [address] Shipping/mailing       [place], [place]  [address]       Ph: [phone]       Fax: [phone]   RxID:   [id number]

## 2010-08-13 NOTE — Progress Notes (Signed)
Summary: test results and chest pain   Phone Note Call from Patient Call back at [phone]   Caller: pt Reason for Call: Talk to Nurse, Lab or Test Results Summary of Call: pt has still been having chest pain last time being on saturday. She also needs her stress test results. Initial call taken by: Faythe Ghee,  April 09, 2010 8:41 AM  Follow-up for Phone Call        nuclear scan was not ordered by Dr Darvin Neighbours was the reader of the study--I talked with Eunice Blase at the [facility] and she confirmed the results are waiting Dr Cato Mulligan review--I talked with pt to let her know Dr Cato Mulligan' office should be following up with her--pt told me she had some chest pain on Saturday--I told Debbie that pt's message did mention chest pain and Debbie suggested I recommend pt call [facility] triage line if she feels she needs more immediate followup-pt is aware of this

## 2010-08-13 NOTE — Assessment & Plan Note (Signed)
Summary: cat bite/dm   Vital Signs:  Patient Profile:   55 Years Old Female Height:     63 inches Weight:      121 pounds Temp:     98.5 degrees F oral BP sitting:   122 / 82  (left arm) Cuff size:   regular  Vitals Entered By: Alfred Levins, CMA (March 08, 2008 4:28 PM)                 PCP:  [name], MD  Chief Complaint:  cat bite on rt hand.  History of Present Illness: She was putting her cat in the carrier this am to go to the vet when the cat became frightened and bit her on the right hand. Since then it has gotten quite painful and swollen. No fever. Ice helps.    Current Allergies (reviewed today): ! PCN ! PHENERGAN ! DOXYCYCLINE ! MORPHINE SULFATE CR (MORPHINE SULFATE)  Past Medical History:    Reviewed history from 02/11/2008 and no changes required:       Multiple Sclerosis       Depression       H/O Gestational Hypertension       H/O Blood Transfusion, 3 units- 1991       GERD       Anxiety       Arthritis     Review of Systems  The patient denies anorexia, fever, weight loss, weight gain, vision loss, decreased hearing, hoarseness, chest pain, syncope, dyspnea on exertion, peripheral edema, prolonged cough, headaches, hemoptysis, abdominal pain, melena, hematochezia, severe indigestion/heartburn, hematuria, incontinence, genital sores, muscle weakness, suspicious skin lesions, transient blindness, difficulty walking, depression, unusual weight change, abnormal bleeding, enlarged lymph nodes, angioedema, breast masses, and testicular masses.     Physical Exam  General:     Well-developed,well-nourished,in no acute distress; alert,appropriate and cooperative throughout examination Skin:     erythema, warmth, swelling ,and tenderness over the right hand (especially the base of the thumb) and the right wrist. Full ROM.    Impression & Recommendations:  Problem # 1:  CELLULITIS&ABSCESS OF HAND EXCEPT FINGERS&THUMB (ICD-682.4)  Her updated  medication list for this problem includes:    Avelox 400 Mg Tabs (Moxifloxacin hcl) ..... Once daily    Clindamycin Hcl 300 Mg Caps (Clindamycin hcl) .Marland KitchenMarland KitchenMarland KitchenMarland Kitchen 4 times a day  Orders: Rocephin  250mg  (J8841) Admin of Therapeutic Inj  intramuscular or subcutaneous (66063)   Complete Medication List: 1)  Ambien 10 Mg Tabs (Zolpidem tartrate) .... 1/2 by mouth as needed for insomnia 2)  Prozac 20 Mg Caps (Fluoxetine hcl) .... 3 once daily 3)  Copaxone 20 Mg/ml Kit (Glatiramer acetate) .... Subcutaneously daily 4)  Carafate 1 Gm/42ml Susp (Sucralfate) .... One tablespoon tid 5)  First-mouthwash Blm Susp (Dph-lido-alhydr-mghydr-simeth) .... As needed 6)  Systane 0.4-0.3 % Soln (Polyethyl glycol-propyl glycol) .... Three times a day 7)  Analpram-hc 1-2.5 % Crea (Hydrocortisone ace-pramoxine) .... As needed for rectal bleeding, hemorrhoids 8)  Miralax Powd (Polyethylene glycol 3350) .... 3 to 4 times weekly 9)  Vivelle-dot 0.075 Mg/24hr Pttw (Estradiol) .... As directed 10)  Avelox 400 Mg Tabs (Moxifloxacin hcl) .... Once daily 11)  Clindamycin Hcl 300 Mg Caps (Clindamycin hcl) .... 4 times a day   Patient Instructions: 1)  Recheck here tomorrow am.    Prescriptions: CLINDAMYCIN HCL 300 MG CAPS (CLINDAMYCIN HCL) 4 times a day  #40 x 0   Entered and Authorized by:   Nelwyn Salisbury MD  Signed by:   Nelwyn Salisbury MD on 03/08/2008   Method used:   Print then Give to Patient   RxID:   [id number] AVELOX 400 MG TABS (MOXIFLOXACIN HCL) once daily  #10 x 0   Entered and Authorized by:   Nelwyn Salisbury MD   Signed by:   Nelwyn Salisbury MD on 03/08/2008   Method used:   Print then Give to Patient   RxID:   [id number]  ]  Medication Administration  Injection # 1:    Medication: Rocephin  250mg     Diagnosis: CELLULITIS&ABSCESS OF HAND EXCEPT FINGERS&THUMB (ICD-682.4)    Route: IM    Site: LUOQ gluteus    Exp Date: 10/2010    Lot #: [id number]    Mfr: Sandoz    Comments: 1gm    Patient  tolerated injection without complications    Given by: Alfred Levins, CMA (March 08, 2008 5:16 PM)  Orders Added: 1)  Est. Patient Level III [95284] 2)  Rocephin  250mg  [J0696] 3)  Admin of Therapeutic Inj  intramuscular or subcutaneous [13244]

## 2010-08-13 NOTE — Letter (Signed)
Summary: [facility]  [facility] Provider: This provider was preselected by the workflow.  Signature: The signature status of this document was preset by the workflow  Processed by [employer] Local Indexer Client @ Monday, May 21, 2009 1:40:34 PM using version:2010.1.2.11(2.4)   Manually Indexed By: [id number]  idlBatchDetail: [id number]   _____________________________________________________________________  External Attachment:    Type:   Image     Comment:   External Document

## 2010-08-13 NOTE — Consult Note (Signed)
Summary: [facility]  [facility]   Imported By: Maryln Gottron 11/14/2009 13:39:02  _____________________________________________________________________  External Attachment:    Type:   Image     Comment:   External Document

## 2010-08-13 NOTE — Progress Notes (Signed)
Summary: pt wants Dr. Timoteo Gaul and Dr. Clent Ridges to be notified of her request  Phone Note Call from Patient   Caller: Patient Call For: [name] MD Summary of Call: Pt is calling for refills on Ambien and Lorazepam.  Pharmacy says she is out of refills, and she is not seeing the MD that originally prescribed these.?? [phone] Initial call taken by: Lynann Beaver CMA,  November 09, 2009 1:47 PM  Follow-up for Phone Call        she will have to talk to Dr. Cato Mulligan about this. I cannot help her today  Follow-up by: Nelwyn Salisbury MD,  November 09, 2009 3:29 PM  Additional Follow-up for Phone Call Additional follow up Details #1::        Per Dr. Cato Mulligan, he is not aware of her taking these meds, so he cannot prescribe them. Lynann Beaver CMA  Nov 12, 2009 8:15 AM. Additional Follow-up by: [name] MD,  November 10, 2009 3:23 PM

## 2010-08-13 NOTE — Assessment & Plan Note (Signed)
Summary: med check/refills/cjr   Vital Signs:  Patient profile:   55 year old female Weight:      122 pounds BMI:     20.38 Temp:     98.2 degrees F oral Pulse rate:   64 / minute Pulse rhythm:   regular Resp:     12 per minute BP sitting:   98 / 66  (left arm) Cuff size:   regular  Vitals Entered By: Gladis Riffle, RN (Nov 14, 2009 1:35 PM) CC: medication review and refills--once got ambien and lorazepam elsewhere--c/o not sleeping well and stress at home Is Patient Diabetic? No   Primary Care Provider:  [name], MD  CC:  medication review and refills--once got ambien and lorazepam elsewhere--c/o not sleeping well and stress at home.  History of Present Illness: 6-8 weeks anxiety/worry/can't sleep has some issues at workand has financial concerns at home "can't turn my brain off" has been on prozac for 15 +years. She says that, in the past, when she has tried to taper off prozac she has had recurrent anxiety.   Preventive Screening-Counseling & Management  Alcohol-Tobacco     Smoking Status: never  Current Problems (verified): 1)  Multiple Sclerosis  (ICD-340) 2)  Unspecified Vitamin D Deficiency  (ICD-268.9) 3)  Hemorrhoids  (ICD-455.6) 4)  Anxiety  (ICD-300.00) 5)  Hypertension, Gestational  (ICD-642.90) 6)  Gerd  (ICD-530.81) 7)  Cyst, Ovarian Nec/nos  (ICD-620.2) 8)  Depression  (ICD-311)  Current Medications (verified): 1)  Prozac 20 Mg  Caps (Fluoxetine Hcl) .... 3 Once Daily 2)  Carafate 1 Gm/26ml  Susp (Sucralfate) .... One Tablespoon Three Times A Day and At Bedtime 3)  First-Mouthwash Blm   Susp (Dph-Lido-Alhydr-Mghydr-Simeth) .Marland KitchenMarland KitchenMarland Kitchen 15-30 Cc Rinse and Swallow As Needed 4)  Systane 0.4-0.3 %  Soln (Polyethyl Glycol-Propyl Glycol) .... Three Times A Day 5)  Analpram-Hc 1-2.5 %  Crea (Hydrocortisone Ace-Pramoxine) .... As Needed For Rectal Bleeding, Hemorrhoids 6)  Miralax   Powd (Polyethylene Glycol 3350) .... 3 To 4 Times Weekly 7)  Vivelle-Dot 0.075  Mg/24hr  Pttw (Estradiol) .... As Directed 8)  Flexeril 10 Mg Tabs (Cyclobenzaprine Hcl) .... Take 1 Tablet By Mouth Once A Day As Needed 9)  Nexium 40 Mg  Cpdr (Esomeprazole Magnesium) .Marland Kitchen.. 1 Capsule Each Day 30 Minutes Before Meal 10)  Diphenoxylate-Atropine 2.5-0.025 Mg Tabs (Diphenoxylate-Atropine) .Marland Kitchen.. 1 By Mouth Q 6 Hrs As Needed Cramps/diarrhea 11)  Prochlorperazine Maleate 10 Mg Tabs (Prochlorperazine Maleate) .Marland Kitchen.. 1 Q 6 Hours As Needed Nausea 12)  Trazodone Hcl 50 Mg Tabs (Trazodone Hcl) .... 1/2-1 By Mouth Q Hs As Needed Insomnia 13)  Tussionex Pennkinetic Er 8-10 Mg/29ml Lqcr (Chlorpheniramine-Hydrocodone) .Marland Kitchen.. 1 Teaspoon At Bedtime As Needed Cough 14)  Zomig 5 Mg Tabs (Zolmitriptan) .... One Tablet Orally At The Time of Headache.  May Repeat X1 in One Hour If Headache Persists. 15)  Fish Oil 1000 Mg Caps (Omega-3 Fatty Acids) .... Once Daily 16)  Multivitamins  Tabs (Multiple Vitamin) .... Once Daily 17)  Ambien 10 Mg Tabs (Zolpidem Tartrate) .... Take 1 Tablet By Mouth At Bedtime As Needed 18)  Lorazepam 1 Mg Tabs (Lorazepam) .... Take One To Two As Needed Anxiety  Allergies: 1)  ! Pcn 2)  ! Phenergan 3)  ! Doxycycline 4)  ! Morphine Sulfate Cr (Morphine Sulfate)  Past History:  Past Medical History: Last updated: 02/11/2008 Multiple Sclerosis Depression H/O Gestational Hypertension H/O Blood Transfusion, 3 units- 1991 GERD Anxiety Arthritis  Past Surgical History:  Last updated: 02/13/2007 Hysterectomy-endometriosis and fibroids-2007 Tonsillectomy S/P Removal of Morton's Neuroma  Family History: Last updated: 06/25/2007 mother alive with htn and lipids Family History High cholesterol Family History Hypertension Family History of CAD Female 1st degree relative <50 father but died of suicide Family History of Suicide attempt  one uncle with type 2 dm  Social History: Last updated: 02/11/2008 Occupation: Nurse [employer] Married 2 kids both healthy Never  Smoked Alcohol use-no Regular exercise-no--too tired  Risk Factors: Exercise: no (09/30/2006)  Risk Factors: Smoking Status: never (11/14/2009)  Physical Exam  General:  alert and well-developed.   Head:  normocephalic and atraumatic.   Eyes:  pupils equal and pupils round.   Ears:  R ear normal and L ear normal.   Neck:  No deformities, masses, or tenderness noted. Lungs:  normal respiratory effort and no intercostal retractions.   Heart:  normal rate and regular rhythm.   Abdomen:  soft and non-tender.   Psych:  normally interactive and good eye contact.     Impression & Recommendations:  Problem # 1:  ANXIETY (ICD-300.00) discussed at length will try medication change see new meds side effects discussed The following medications were removed from the medication list:    Prozac 20 Mg Caps (Fluoxetine hcl) .Marland KitchenMarland KitchenMarland KitchenMarland Kitchen 3 once daily Her updated medication list for this problem includes:    Trazodone Hcl 50 Mg Tabs (Trazodone hcl) .Marland Kitchen... 1/2-1 by mouth q hs as needed insomnia    Lorazepam 1 Mg Tabs (Lorazepam) .Marland Kitchen... Take one to two as needed anxiety    Venlafaxine Hcl 75 Mg Xr24h-cap (Venlafaxine hcl) .Marland Kitchen... Take 1 tablet by mouth once a day for 7 days and then 2 by mouth once daily  Complete Medication List: 1)  Carafate 1 Gm/39ml Susp (Sucralfate) .... One tablespoon three times a day and at bedtime 2)  First-mouthwash Blm Susp (Dph-lido-alhydr-mghydr-simeth) .Marland KitchenMarland KitchenMarland Kitchen 15-30 cc rinse and swallow as needed 3)  Systane 0.4-0.3 % Soln (Polyethyl glycol-propyl glycol) .... Three times a day 4)  Analpram-hc 1-2.5 % Crea (Hydrocortisone ace-pramoxine) .... As needed for rectal bleeding, hemorrhoids 5)  Miralax Powd (Polyethylene glycol 3350) .... 3 to 4 times weekly 6)  Vivelle-dot 0.075 Mg/24hr Pttw (Estradiol) .... As directed 7)  Flexeril 10 Mg Tabs (Cyclobenzaprine hcl) .... Take 1 tablet by mouth once a day as needed 8)  Nexium 40 Mg Cpdr (Esomeprazole magnesium) .Marland Kitchen.. 1 capsule each  day 30 minutes before meal 9)  Diphenoxylate-atropine 2.5-0.025 Mg Tabs (Diphenoxylate-atropine) .Marland Kitchen.. 1 by mouth q 6 hrs as needed cramps/diarrhea 10)  Prochlorperazine Maleate 10 Mg Tabs (Prochlorperazine maleate) .Marland Kitchen.. 1 q 6 hours as needed nausea 11)  Trazodone Hcl 50 Mg Tabs (Trazodone hcl) .... 1/2-1 by mouth q hs as needed insomnia 12)  Tussionex Pennkinetic Er 8-10 Mg/50ml Lqcr (Chlorpheniramine-hydrocodone) .Marland Kitchen.. 1 teaspoon at bedtime as needed cough 13)  Zomig 5 Mg Tabs (Zolmitriptan) .... One tablet orally at the time of headache.  may repeat x1 in one hour if headache persists. 14)  Fish Oil 1000 Mg Caps (Omega-3 fatty acids) .... Once daily 15)  Multivitamins Tabs (Multiple vitamin) .... Once daily 16)  Ambien 10 Mg Tabs (Zolpidem tartrate) .... Take 1 tablet by mouth at bedtime as needed 17)  Lorazepam 1 Mg Tabs (Lorazepam) .... Take one to two as needed anxiety 18)  Venlafaxine Hcl 75 Mg Xr24h-cap (Venlafaxine hcl) .... Take 1 tablet by mouth once a day for 7 days and then 2 by mouth once daily  Patient Instructions: 1)  Please schedule a follow-up appointment in 1 month. Prescriptions: LORAZEPAM 1 MG TABS (LORAZEPAM) take one to two as needed anxiety  #30 x 0   Entered by:   Gladis Riffle, RN   Authorized by:   [name] MD   Signed by:   Gladis Riffle, RN on 11/15/2009   Method used:   Telephoned to ...       [facility]* (retail)       [address]       [address] Shipping/mailing       [place], [place]  [address]       Ph: [phone]       Fax: [phone]   RxID:   [id number] AMBIEN 10 MG TABS (ZOLPIDEM TARTRATE) Take 1 tablet by mouth at bedtime as needed  #20 x 0   Entered by:   Gladis Riffle, RN   Authorized by:   [name] MD   Signed by:   Gladis Riffle, RN on 11/15/2009   Method used:   Telephoned to ...       [facility]* (retail)       [address]       [address] Shipping/mailing       [place], [place]   [address]       Ph: [phone]       Fax: [phone]   RxID:   [id number] VENLAFAXINE HCL 75 MG XR24H-CAP (VENLAFAXINE HCL) Take 1 tablet by mouth once a day for 7 days and then 2 by mouth once daily  #60 x 3   Entered and Authorized by:   [name] MD   Signed by:   [name] MD on 11/14/2009   Method used:   Electronically to        [facility]* (retail)       [address]       [address] Shipping/mailing       [place], [place]  [address]       Ph: [phone]       Fax: [phone]   RxID:   [id number]

## 2010-08-13 NOTE — Progress Notes (Signed)
Summary: lorazepam refill  Phone Note Refill Request Message from:  Fax from Pharmacy on January 09, 2010 11:46 AM  Refills Requested: Medication #1:  LORAZEPAM 1 MG TABS take one to two as needed anxiety Initial call taken by: Kern Reap CMA Duncan Dull),  January 09, 2010 11:47 AM    Prescriptions: LORAZEPAM 1 MG TABS (LORAZEPAM) take one to two as needed anxiety  #30 x 1   Entered by:   Kern Reap CMA (AAMA)   Authorized by:   [name] MD   Signed by:   Kern Reap CMA (AAMA) on 01/09/2010   Method used:   Historical   RxID:   [id number]

## 2010-08-13 NOTE — Assessment & Plan Note (Signed)
    History of Present Illness: New Dx of MS 05/2006.---profound fatigue as presenting symptom Depression due to Betaseron--prozac helpful Extreme fatigue--better with Ritalin Insomnia--better with Ambien Trigeminal neuralgia thought secondary to MS---better with Carbatrol (?) Uses nemantine for memory loss She has a current ovarian cyst- left   She has regular lab f/u with neurologist at [facility] (dr Tinnie Gens)    Past Medical History:    Multiple Sclerosis    Depression  Past Surgical History:    Hysterectomy-endometriosis and fibroids   Family History:    mother alive with htn and lipids    Family History High cholesterol    Family History Hypertension    Family History of CAD Female 1st degree relative <50 father but died of suicide    Family History of Suicide attempt  Social History:    Occupation: Engineer, civil (consulting) [employer]    Married    2 kids both healthy    Never Smoked    Alcohol use-no    Regular exercise-no--too tired   Risk Factors:  Tobacco use:  never Alcohol use:  no Exercise:  no  Mammogram History:     Date of Last Mammogram:  04/13/2006    Results:  Normal Bilateral  PAP Smear History:     Date of Last PAP Smear:  10/12/2005    Results:  Normal  Colonoscopy History:     Date of Last Colonoscopy:  07/14/2004    Results:  Normal   Review of Systems  The patient denies anorexia, fever, weight loss, vision loss, decreased hearing, hoarseness, chest pain, syncope, dyspnea on exhertion, peripheral edema, prolonged cough, hemoptysis, abdominal pain, melena, hematochezia, severe indigestion/heartburn, hematuria, incontinence, genital sores, muscle weakness, suspicious skin lesions, transient blindness, difficulty walking, depression, unusual weight change, abnormal bleeding, enlarged lymph nodes, angioedema, breast masses, and testicular masses.         contnues to have fatigue she does admit to a rash on abdomen and thighs secondary to betaseron.   Physical  Exam  General:     Well-developed,well-nourished,in no acute distress; alert,appropriate and cooperative throughout examination Head:     Normocephalic and atraumatic without obvious abnormalities. No apparent alopecia or balding. Eyes:     No corneal or conjunctival inflammation noted. EOMI. Perrla. Funduscopic exam benign, without hemorrhages, exudates or papilledema. Vision grossly normal. Mouth:     Oral mucosa and oropharynx without lesions or exudates.  Teeth in good repair. Neck:     No deformities, masses, or tenderness noted. Chest Wall:     No deformities, masses, or tenderness noted. Lungs:     Normal respiratory effort, chest expands symmetrically. Lungs are clear to auscultation, no crackles or wheezes. Heart:     Normal rate and regular rhythm. S1 and S2 normal without gallop, murmur, click, rub or other extra sounds. Abdomen:     Bowel sounds positive,abdomen soft and non-tender without masses, organomegaly or hernias noted. Msk:     No deformity or scoliosis noted of thoracic or lumbar spine.   Extremities:     No clubbing, cyanosis, edema, or deformity noted with normal full range of motion of all joints.      Impression & Recommendations:  Problem # 1:  SCLEROSIS, MULTIPLE (ICD-340) followed by neurology  Problem # 2:  NEURALGIA, TRIGEMINAL (ICD-350.1) thought secondary to MS. followed by neurology  Problem # 3:  FATIGUE (ICD-780.79) likely MS related  Problem # 4:  DEPRESSION (ICD-311) continue current meds

## 2010-08-13 NOTE — Assessment & Plan Note (Signed)
Summary: uti/dm   Vital Signs:  Patient profile:   55 year old female Height:      65 inches Weight:      121 pounds BMI:     20.21 Temp:     97.9 degrees F BP sitting:   120 / 80  Vitals Entered By: Sindy Guadeloupe RN (November 03, 2008 3:57 PM)  Reason for Visit dysuria x 4 days.  urologist said she has uretheral stone but gave no treatment.  History of Present Illness: She was here a month ago for UTI symptoms. A UA was negative but her culture grew out Enterococcus. She took 7 days of Levaquin and felt much better. However 3 days ago she developed the same symptoms again of burning on urination, urgency to urinate, lower abdominal cramps, and low back pains. Has nausea without vomiting. No fevers. Drinking a lot  of water.   Allergies: 1)  ! Pcn 2)  ! Phenergan 3)  ! Doxycycline 4)  ! Morphine Sulfate Cr (Morphine Sulfate)  Past History:  Past Medical History:    Reviewed history from 02/11/2008 and no changes required:    Multiple Sclerosis    Depression    H/O Gestational Hypertension    H/O Blood Transfusion, 3 units- 1991    GERD    Anxiety    Arthritis  Past Surgical History:    Reviewed history from 02/13/2007 and no changes required:    Hysterectomy-endometriosis and fibroids-2007    Tonsillectomy    S/P Removal of Morton's Neuroma  Review of Systems  The patient denies anorexia, fever, weight loss, weight gain, vision loss, decreased hearing, hoarseness, chest pain, syncope, dyspnea on exertion, peripheral edema, prolonged cough, headaches, hemoptysis, melena, hematochezia, severe indigestion/heartburn, hematuria, incontinence, genital sores, muscle weakness, suspicious skin lesions, transient blindness, difficulty walking, depression, unusual weight change, abnormal bleeding, enlarged lymph nodes, angioedema, breast masses, and testicular masses.    Physical Exam  General:  Well-developed,well-nourished,in no acute distress; alert,appropriate and  cooperative throughout examination Lungs:  Normal respiratory effort, chest expands symmetrically. Lungs are clear to auscultation, no crackles or wheezes. Heart:  Normal rate and regular rhythm. S1 and S2 normal without gallop, murmur, click, rub or other extra sounds. Abdomen:  soft, normal bowel sounds, no distention, no masses, no guarding, no rigidity, no rebound tenderness, no abdominal hernia, no inguinal hernia, no hepatomegaly, and no splenomegaly.  Moderately tender above the pubis. No CVAT.   Impression & Recommendations:  Problem # 1:  UTI (ICD-599.0)  Her updated medication list for this problem includes:    Macrobid 100 Mg Caps (Nitrofurantoin monohyd macro) .Marland Kitchen..Marland Kitchen Two times a day  Orders: T-Culture, Urine ([id number])  Complete Medication List: 1)  Ambien 10 Mg Tabs (Zolpidem tartrate) .... 1/2 by mouth as needed for insomnia 2)  Prozac 20 Mg Caps (Fluoxetine hcl) .... 3 once daily 3)  Copaxone 20 Mg/ml Kit (Glatiramer acetate) .... 20 mg subcutaneously daily 4)  Carafate 1 Gm/50ml Susp (Sucralfate) .... One tablespoon tid 5)  First-mouthwash Blm Susp (Dph-lido-alhydr-mghydr-simeth) .... As needed 6)  Systane 0.4-0.3 % Soln (Polyethyl glycol-propyl glycol) .... Three times a day 7)  Analpram-hc 1-2.5 % Crea (Hydrocortisone ace-pramoxine) .... As needed for rectal bleeding, hemorrhoids 8)  Miralax Powd (Polyethylene glycol 3350) .... 3 to 4 times weekly 9)  Vivelle-dot 0.075 Mg/24hr Pttw (Estradiol) .... As directed 10)  Flexeril 10 Mg Tabs (Cyclobenzaprine hcl) .... Take 1 tablet by mouth once a day as needed 11)  Nexium 40 Mg Cpdr (  Esomeprazole magnesium) .Marland Kitchen.. 1 capsule each day 30 minutes before meal 12)  Diphenoxylate-atropine 2.5-0.025 Mg Tabs (Diphenoxylate-atropine) .Marland Kitchen.. 1 by mouth q 6 hrs as needed cramps/diarrhea 13)  Macrobid 100 Mg Caps (Nitrofurantoin monohyd macro) .... Two times a day 14)  Prochlorperazine Maleate 10 Mg Tabs (Prochlorperazine maleate) .Marland Kitchen.. 1  q 6 hours as needed nausea  Other Orders: UA Dipstick w/o Micro (automated)  ([id number])  Patient Instructions: 1)  Try Macrobid since the Enterococcus last time was sensitive to that as well. Reculture the urine today.  Prescriptions: PROCHLORPERAZINE MALEATE 10 MG TABS (PROCHLORPERAZINE MALEATE) 1 q 6 hours as needed nausea  #60 x 0   Entered and Authorized by:   Nelwyn Salisbury MD   Signed by:   Nelwyn Salisbury MD on 11/03/2008   Method used:   Electronically to        [facility]  [place] [address] (retail)       [address]       [place], [place]  [address]       Ph: [phone] or [phone]       Fax: [phone]   RxID:   [id number] MACROBID 100 MG CAPS (NITROFURANTOIN MONOHYD MACRO) two times a day  #20 x 0   Entered and Authorized by:   Nelwyn Salisbury MD   Signed by:   Nelwyn Salisbury MD on 11/03/2008   Method used:   [facility]ronically to        [facility]  [place] [address] (retail)       [address]       [place], [place]  [address]       Ph: [phone] or [phone]       Fax: [phone]   RxID:   [id number]   Laboratory Results   Urine Tests    Routine Urinalysis   Color: yellow Appearance: Clear Glucose: negative   (Normal Range: Negative) Bilirubin: negative   (Normal Range: Negative) Ketone: negative   (Normal Range: Negative) Spec. Gravity: 1.020   (Normal Range: 1.003-1.035) Blood: negative   (Normal Range: Negative) pH: 5.5   (Normal Range: 5.0-8.0) Protein: negative   (Normal Range: Negative) Urobilinogen: 0.2   (Normal Range: 0-1) Nitrite: negative   (Normal Range: Negative) Leukocyte Esterace: negative   (Normal Range: Negative)    Comments: Joanne Chars CMA  November 03, 2008 4:02 PM     Laboratory Results   Urine Tests    Routine Urinalysis   Color: yellow Appearance: Clear Glucose: negative   (Normal Range: Negative) Bilirubin: negative   (Normal Range: Negative) Ketone: negative   (Normal Range: Negative) Spec. Gravity: 1.020    (Normal Range: 1.003-1.035) Blood: negative   (Normal Range: Negative) pH: 5.5   (Normal Range: 5.0-8.0) Protein: negative   (Normal Range: Negative) Urobilinogen: 0.2   (Normal Range: 0-1) Nitrite: negative   (Normal Range: Negative) Leukocyte Esterace: negative   (Normal Range: Negative)    Comments: Joanne Chars CMA  November 03, 2008 4:02 PM

## 2010-08-13 NOTE — Progress Notes (Signed)
Summary: FSH and TSH needed   Phone Note Other Incoming   Summary of Call: Having hot flashes, needs FSH and TSH peer Dr. Jennette Kettle. Initial call taken by: Iva Boop MD, Clementeen Graham,  March 07, 2010 8:32 AM  New Problems: HOT FLASHES (ICD-627.2)   New Problems: HOT FLASHES (ICD-627.2)

## 2010-08-13 NOTE — Assessment & Plan Note (Signed)
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 Her fatique is in entire body including torso she has problem seeing Dr.Jeffries, neurologist at [facility].  complains of pain cervical spine near rt ear or radiating to rt ear as chronic urinary problem due to MS  Allergies: 1)  ! Pcn 2)  ! Phenergan 3)  ! Doxycycline 4)  ! Morphine Sulfate Cr (Morphine Sulfate)  Review of Systems      See HPI General:  Complains of fatigue and malaise; denies chills, fever, loss of appetite, sleep disorder, sweats, weakness, and weight loss. Eyes:  Denies blurring, discharge, double vision, eye irritation, eye pain, halos, itching, light sensitivity, red eye, vision loss-1 eye, and vision loss-both eyes. ENT:  Denies decreased hearing, difficulty swallowing, ear discharge, earache, hoarseness, nasal congestion, nosebleeds, postnasal drainage, ringing in ears, sinus pressure, and sore throat. CV:  Denies bluish discoloration of lips or nails, chest pain or discomfort, difficulty breathing at night, difficulty breathing while lying down, fainting, fatigue, leg cramps with exertion, lightheadness, near fainting, palpitations, shortness of breath with exertion, swelling of feet, swelling of hands, and weight gain. Resp:  Denies chest discomfort, chest pain with inspiration, cough, coughing up blood, excessive snoring, hypersomnolence, morning headaches, pleuritic, shortness of breath,  sputum productive, and wheezing. GI:  Denies abdominal pain, bloody stools, change in bowel habits, constipation, dark tarry stools, diarrhea, excessive appetite, gas, hemorrhoids, indigestion, loss of appetite, nausea, vomiting, vomiting blood, and yellowish skin color. GU:  See HPI. MS:  See HPI; Complains of joint pain; neck pain.  Physical Exam  General:  Well-developed,well-nourished,in no acute distress; alert,appropriate and cooperative throughout examination Head:  Normocephalic and atraumatic without obvious abnormalities. No apparent alopecia or balding. Eyes:  No corneal or conjunctival inflammation noted. EOMI. Perrla. Funduscopic exam benign, without hemorrhages, exudates or papilledema. Vision grossly normal. Ears:  External ear exam shows no significant lesions or deformities.  Otoscopic examination reveals clear canals, tympanic membranes are intact bilaterally without bulging, retraction, inflammation or discharge. Hearing is grossly normal bilaterally. Nose:  External nasal examination shows no deformity or inflammation. Nasal mucosa are pink and moist without lesions or exudates. Mouth:  Oral mucosa and oropharynx without lesions or exudates.  Teeth in good repair. Neck:  tender rt cervical spine Chest Wall:  No deformities, masses, or tenderness noted. Lungs:  Normal respiratory effort, chest expands symmetrically. Lungs are clear to auscultation, no crackles or wheezes. Heart:  Normal rate and regular rhythm. S1 and S2 normal without gallop, murmur, click, rub or other extra sounds. Abdomen:  Bowel sounds positive,abdomen soft and non-tender without masses, organomegaly or hernias noted. Msk:  No deformity or scoliosis noted of thoracic or lumbar spine.   Extremities:  No clubbing, cyanosis, edema, or deformity noted with  normal full range of motion of all joints.   Neurologic:  No cranial nerve deficits noted. Station and gait are normal. Plantar reflexes are down-going  bilaterally. DTRs are symmetrical throughout. Sensory, motor and coordinative functions appear intact. Skin:  Intact without suspicious lesions or rashes Cervical Nodes:  No lymphadenopathy noted Psych:  Cognition and judgment appear intact. Alert and cooperative with normal attention span and concentration. No apparent delusions, illusions, hallucinations   Impression & Recommendations:  Problem # 1:  MULTIPLE SCLEROSIS (ICD-340) Assessment Deteriorated  Orders: Radiology Referral (Radiology)  Problem # 2:  FATIGUE (ICD-780.79) Assessment: Deteriorated  Problem # 3:  NECK PAIN (ICD-723.1) Assessment: New  Her updated medication list for this problem includes:    Flexeril 10 Mg Tabs (Cyclobenzaprine hcl) .Marland Kitchen... Take 1 tablet by mouth once a day as needed  Problem # 4:  VERTIGO (ICD-780.4) Assessment: Deteriorated  Complete Medication List: 1)  Ambien 10 Mg Tabs (Zolpidem tartrate) .... 1/2 by mouth as needed for insomnia 2)  Prozac 20 Mg Caps (Fluoxetine hcl) .... 3 once daily 3)  Copaxone 20 Mg/ml Kit (Glatiramer acetate) .... 20 mg subcutaneously daily 4)  Carafate 1 Gm/19ml Susp (Sucralfate) .... One tablespoon tid 5)  First-mouthwash Blm Susp (Dph-lido-alhydr-mghydr-simeth) .... As needed 6)  Systane 0.4-0.3 % Soln (Polyethyl glycol-propyl glycol) .... Three times a day 7)  Analpram-hc 1-2.5 % Crea (Hydrocortisone ace-pramoxine) .... As needed for rectal bleeding, hemorrhoids 8)  Miralax Powd (Polyethylene glycol 3350) .... 3 to 4 times weekly 9)  Vivelle-dot 0.075 Mg/24hr Pttw (Estradiol) .... As directed 10)  Flexeril 10 Mg Tabs (Cyclobenzaprine hcl) .... Take 1 tablet by mouth once a day as needed 11)  Nexium 40 Mg Cpdr (Esomeprazole magnesium) .Marland Kitchen.. 1 capsule each day 30 minutes before meal 12)  Diphenoxylate-atropine 2.5-0.025 Mg Tabs (Diphenoxylate-atropine) .Marland Kitchen.. 1 by mouth q 6 hrs as needed cramps/diarrhea 13)  Prochlorperazine Maleate 10 Mg Tabs (Prochlorperazine  maleate) .Marland Kitchen.. 1 q 6 hours as needed nausea  Patient Instructions: 1)  to schedule MRI 2)  Depomedrol for neck pain and fatique    Current Allergies (reviewed today): ! PCN ! PHENERGAN ! DOXYCYCLINE ! MORPHINE SULFATE CR (MORPHINE SULFATE)

## 2010-08-13 NOTE — Consult Note (Signed)
Summary: [facility]  [facility]   Imported By: Kassie Mends 01/20/2008 13:33:30  _____________________________________________________________________  External Attachment:    Type:   Image     Comment:   [facility]

## 2010-08-13 NOTE — Progress Notes (Signed)
Summary: wants UA,C&S sw pt  Phone Note Call from Patient Call back at [phone] or h [phone]   Caller: vm 12:49 Call For: [name] for [facility] Summary of Call: Urine culture 3 mo ago.  No results IDX.  Symptoms of fever  T 99.8 & not feeling good, urethra hurts with voiding [facility] OP pharm.  Need order calle to [facility],  She has already given specimen.  UA & C&S.  Requests Dr. Clent Ridges to do this if possible since Dr. Cato Mulligan out.   Called back & added details per patient.  Rudy Jew, RN  September 19, 2008 1:22 PM  Initial call taken by: Rudy Jew, RN,  September 19, 2008 12:56 PM  Follow-up for Phone Call        put in orders for UA and C and S for 788.1 Follow-up by: Nelwyn Salisbury MD,  September 19, 2008 1:54 PM  Additional Follow-up for Phone Call Additional follow up Details #1::        Done.  Patient aware. Additional Follow-up by: Rudy Jew, RN,  September 19, 2008 4:51 PM  New Problems: DYSURIA (ICD-788.1)   New Problems: DYSURIA (ICD-788.1)

## 2010-08-13 NOTE — Assessment & Plan Note (Signed)
Summary: hair falling out/mhf   Vital Signs:  Patient Profile:   55 Years Old Female Height:     63 inches Weight:      123 pounds Temp:     99 degrees F Pulse rate:   74 / minute BP sitting:   102 / 78  (left arm)  Vitals Entered By: Gladis Riffle, RN (May 16, 2008 12:23 PM)                 PCP:  [name], MD  Chief Complaint:  hair falling out x 1 month.  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  Past Medical History: Multiple Sclerosis Depression H/O Gestational Hypertension H/O Blood Transfusion, 3 units- 1991 GERD Anxiety Arthritis  Past Surgical History: Hysterectomy-endometriosis and fibroids-2007 Tonsillectomy S/P Removal of Morton's Neuroma  Social History: Occupation: Agricultural engineer Married 2 kids both healthy Never Smoked Alcohol use-no Regular exercise-no--too tired  Family History: mother alive with htn and lipids Family History High cholesterol Family History Hypertension Family History of CAD Female 1st degree relative <50 father but died of suicide Family History of Suicide attempt  one uncle with type 2 dm no other complaints in a complete ROS     Updated Prior Medication List: AMBIEN 10 MG TABS (ZOLPIDEM TARTRATE) 1/2 by mouth as needed for insomnia PROZAC 20 MG  CAPS (FLUOXETINE HCL) 3 once daily COPAXONE 20 MG/ML  KIT (GLATIRAMER ACETATE) 20 mg subcutaneously daily CARAFATE 1 GM/10ML  SUSP (SUCRALFATE) one tablespoon tid FIRST-MOUTHWASH BLM   SUSP (DPH-LIDO-ALHYDR-MGHYDR-SIMETH) as needed SYSTANE 0.4-0.3 %  SOLN (POLYETHYL GLYCOL-PROPYL GLYCOL) three times a day ANALPRAM-HC 1-2.5 %  CREA (HYDROCORTISONE ACE-PRAMOXINE) as needed for rectal bleeding, hemorrhoids MIRALAX   POWD (POLYETHYLENE GLYCOL 3350) 3 to 4 times weekly VIVELLE-DOT 0.075 MG/24HR  PTTW (ESTRADIOL) as directed FLEXERIL 10 MG TABS (CYCLOBENZAPRINE HCL) Take 1 tablet by mouth  once a day as needed  Current Allergies (reviewed today): ! PCN ! PHENERGAN ! DOXYCYCLINE ! MORPHINE SULFATE CR (MORPHINE SULFATE)      Physical Exam  General:     Well-developed,well-nourished,in no acute distress; alert,appropriate and cooperative throughout examination Head:     normocephalic and atraumatic.   Skin:     some thinning around hailine    Impression & Recommendations:  Problem # 1:  HAIR LOSS (ICD-704.00) check labs---may need derm referral Orders: Venipuncture (86578) TLB-CBC Platelet - w/Differential (85025-CBCD) TLB-B12 + Folate Pnl ([id number]/GMW) TLB-TSH (Thyroid Stimulating Hormone) (84443-TSH)   Complete Medication List: 1)  Ambien 10 Mg Tabs (Zolpidem tartrate) .... 1/2 by mouth as needed for insomnia 2)  Prozac 20 Mg Caps (Fluoxetine hcl) .... 3 once daily 3)  Copaxone 20 Mg/ml Kit (Glatiramer acetate) .... 20 mg subcutaneously daily 4)  Carafate 1 Gm/95ml Susp (Sucralfate) .... One tablespoon tid 5)  First-mouthwash Blm Susp (Dph-lido-alhydr-mghydr-simeth) .... As needed 6)  Systane 0.4-0.3 % Soln (Polyethyl glycol-propyl glycol) .... Three times a day 7)  Analpram-hc 1-2.5 % Crea (Hydrocortisone ace-pramoxine) .... As needed for rectal bleeding, hemorrhoids 8)  Miralax Powd (Polyethylene glycol 3350) .... 3 to 4 times weekly 9)  Vivelle-dot 0.075 Mg/24hr Pttw (Estradiol) .... As directed 10)  Flexeril 10 Mg Tabs (Cyclobenzaprine hcl) .... Take 1 tablet by mouth once a day as needed  Other Orders: T-Vitamin D (25-Hydroxy) ([id number]) Sedimentation Rate, non-automated (40347)    ] Laboratory Results   Blood Tests    Date/Time  Reported: May 16, 2008 3:36 PM   SED rate: 3  Comments: Wynona Canes, CMA  May 16, 2008 3:36 PM

## 2010-08-13 NOTE — Assessment & Plan Note (Signed)
Summary: Cardiology Nuclear Testing  Nuclear Med Background Indications for Stress Test: Evaluation for Ischemia, Post Hospital  Indications Comments: 04/01/10 [facility] ER with chest pressure/diaphoresis and neg enzymes.  History: Myocardial Perfusion Study  History Comments: '06 [id number]  Symptoms: Chest Pressure, Diaphoresis, Dizziness, Fatigue, Nausea, Near Syncope, Rapid HR  Symptoms Comments: Last episode of FA:OZHY a.m., 3/10.   Nuclear Pre-Procedure Cardiac Risk Factors: Family History - CAD Caffeine/Decaff Intake: 8 am NPO After: 8:00 AM Lungs: Clear IV 0.9% NS with Angio Cath: 20g     IV Site: (R) AC IV Started by: Stanton Kidney, EMT-P Chest Size (in) 36     Cup Size C     Height (in): 65 Weight (lb): 123 BMI: 20.54  Nuclear Med Study 1 or 2 day study:  1 day     Stress Test Type:  Stress Reading MD:  Marca Ancona, MD     Referring MD:  [name], MD Resting Radionuclide:  Technetium 33m Tetrofosmin     Resting Radionuclide Dose:  11 mCi  Stress Radionuclide:  Technetium 32m Tetrofosmin     Stress Radionuclide Dose:  33 mCi   Stress Protocol Exercise Time (min):  9:00 min     Max HR:  150 bpm     Predicted Max HR:  167 bpm  Max Systolic BP: 149 mm Hg     Percent Max HR:  89.82 %     METS: 10.1 Rate Pressure Product:  86578    Stress Test Technologist:  Rea College, CMA-N     Nuclear Technologist:  Domenic Polite, CNMT  Rest Procedure  Myocardial perfusion imaging was performed at rest 45 minutes following the intravenous administration of Technetium 23m Tetrofosmin.  Stress Procedure  The patient exercised for nine minutes.  The patient stopped due to fatigue and denied any chest pain.  There were no significant ST-T wave changes.  Technetium 46m Tetrofosmin was injected at peak exercise and myocardial perfusion imaging was performed after a brief delay.  QPS Raw Data Images:  Normal; no motion artifact; normal heart/lung ratio. Stress Images:  Normal  homogeneous uptake in all areas of the myocardium. Rest Images:  Normal homogeneous uptake in all areas of the myocardium. Subtraction (SDS):  There is no evidence of scar or ischemia. Transient Ischemic Dilatation:  .97  (Normal <1.22)  Lung/Heart Ratio:  .28  (Normal <0.45)  Quantitative Gated Spect Images QGS EDV:  54 ml QGS ESV:  16 ml QGS EF:  70 % QGS cine images:  Normal wall motion.    Overall Impression  Exercise Capacity: Good exercise capacity. BP Response: Normal blood pressure response. Clinical Symptoms: Short of breath, fatigue.  ECG Impression: Insignificant upsloping ST segment depression. Overall Impression: Normal stress nuclear study.

## 2010-08-13 NOTE — Consult Note (Signed)
Summary: dr Lovell Sheehan note  dr Lovell Sheehan note   Imported By: Kassie Mends 11/19/2007 14:38:08  _____________________________________________________________________  External Attachment:    Type:   Image     Comment:   dr Lovell Sheehan note

## 2010-08-13 NOTE — Assessment & Plan Note (Signed)
Summary: ?uti/pain in right side of neck/neck swollen/cjr/pt rescd//ccm   Vital Signs:  Patient profile:   55 year old female Weight:      120 pounds Temp:     99.2 degrees F oral BP sitting:   108 / 70  (right arm) Cuff size:   regular  Vitals Entered By: Duard Brady LPN (August 23, 2009 4:17 PM) CC: c/o ?UTI - pain , increased freq. , c/o neck pain   Primary Care Provider:  [name], MD  CC:  c/o ?UTI - pain , increased freq. , and c/o neck pain.  History of Present Illness: 89 the old patient has a complex past medical history; [facility] two months ago for a possible exacerbation of MS.  Evaluation included an a cervical MRI.  Over the past two weeks, she has had increasing neck pain and diminished range of motion.  She has felt she has a low-grade fever.  Another complaint is some dysuria.  A UA was reviewed and was negative.  She does have a history of interstitial cystitis.  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  She has been using Flexeril and anti-inflammatory medications without much benefit.  she has a ski trip planned for this weekend  and is anxious for improvement  Allergies: 1)  ! Pcn 2)  ! Phenergan 3)  ! Doxycycline 4)  ! Morphine Sulfate Cr (Morphine Sulfate)  Past History:  Past Medical History: Reviewed history from 02/11/2008 and no changes required. Multiple Sclerosis Depression H/O Gestational Hypertension H/O Blood Transfusion, 3 units- 1991 GERD Anxiety Arthritis  Physical Exam  General:  Well-developed,well-nourished,in no acute distress; alert,appropriate and cooperative throughout examination; normal the pressure appears to be in no distress Head:  Normocephalic and atraumatic without obvious abnormalities. No apparent alopecia or balding. Eyes:  No corneal or conjunctival inflammation noted. EOMI. Perrla. Funduscopic exam benign, without hemorrhages, exudates or  papilledema. Vision grossly normal. Ears:  External ear exam shows no significant lesions or deformities.  Otoscopic examination reveals clear canals, tympanic membranes are intact bilaterally without bulging, retraction, inflammation or discharge. Hearing is grossly normal bilaterally. Mouth:  Oral mucosa and oropharynx without lesions or exudates.  Teeth in good repair. Neck:  slight discomfort with head turning to the extreme both the left and right;  full flexion and extension Lungs:  Normal respiratory effort, chest expands symmetrically. Lungs are clear to auscultation, no crackles or wheezes. Heart:  Normal rate and regular rhythm. S1 and S2 normal without gallop, murmur, click, rub or other extra sounds. Abdomen:  Bowel sounds positive,abdomen soft and non-tender without masses, organomegaly or hernias noted.  no suprapubic tenderness   Impression & Recommendations:  Problem # 1:  NECK PAIN (ICD-723.1)  Her updated medication list for this problem includes:    Flexeril 10 Mg Tabs (Cyclobenzaprine hcl) .Marland Kitchen... Take 1 tablet by mouth once a day as needed  Problem # 2:  DYSURIA (ICD-788.1)  Orders: UA Dipstick w/o Micro (manual) (47829)  Complete Medication List: 1)  Prozac 20 Mg Caps (Fluoxetine hcl) .... 3 once daily 2)  Carafate 1 Gm/55ml Susp (Sucralfate) .... One tablespoon three times a day and at bedtime 3)  First-mouthwash Blm Susp (Dph-lido-alhydr-mghydr-simeth) .... As needed 4)  Systane 0.4-0.3 % Soln (Polyethyl glycol-propyl glycol) .... Three times a day 5)  Analpram-hc 1-2.5 % Crea (Hydrocortisone ace-pramoxine) .... As needed for rectal bleeding, hemorrhoids 6)  Miralax Powd (  Polyethylene glycol 3350) .... 3 to 4 times weekly 7)  Vivelle-dot 0.075 Mg/24hr Pttw (Estradiol) .... As directed 8)  Flexeril 10 Mg Tabs (Cyclobenzaprine hcl) .... Take 1 tablet by mouth once a day as needed 9)  Nexium 40 Mg Cpdr (Esomeprazole magnesium) .Marland Kitchen.. 1 capsule each day 30 minutes  before meal 10)  Diphenoxylate-atropine 2.5-0.025 Mg Tabs (Diphenoxylate-atropine) .Marland Kitchen.. 1 by mouth q 6 hrs as needed cramps/diarrhea 11)  Prochlorperazine Maleate 10 Mg Tabs (Prochlorperazine maleate) .Marland Kitchen.. 1 q 6 hours as needed nausea 12)  Trazodone Hcl 50 Mg Tabs (Trazodone hcl) .... 1/2-1 by mouth q hs as needed insomnia 13)  Tussionex Pennkinetic Er 8-10 Mg/29ml Lqcr (Chlorpheniramine-hydrocodone) .Marland Kitchen.. 1 teaspoon at bedtime as needed cough  Patient Instructions: 1)  Take 400-600mg  of Ibuprofen (Advil, Motrin) with food every 4-6 hours as needed for relief of pain or comfort of fever. 2)  You may move around but avoid painful motions. Apply  heat  to sore area for 20 minutes 3-4 times a day for 2-3 days.  Laboratory Results   Urine Tests  Date/Time Received: August 23, 2009   Routine Urinalysis   Color: yellow Appearance: Clear Glucose: negative   (Normal Range: Negative) Bilirubin: negative   (Normal Range: Negative) Ketone: negative   (Normal Range: Negative) Spec. Gravity: 1.025   (Normal Range: 1.003-1.035) Blood: negative   (Normal Range: Negative) pH: 5.0   (Normal Range: 5.0-8.0) Protein: negative   (Normal Range: Negative) Urobilinogen: 0.2   (Normal Range: 0-1) Nitrite: negative   (Normal Range: Negative) Leukocyte Esterace: negative   (Normal Range: Negative)        Appended Document: ?uti/pain in right side of neck/neck swollen/cjr/pt rescd//ccm     Clinical Lists Changes  Orders: Added new Service order of Depo- Medrol 80mg  (J1040) - Signed Added new Service order of Admin of Therapeutic Inj  intramuscular or subcutaneous (45409) - Signed       Medication Administration  Injection # 1:    Medication: Depo- Medrol 80mg     Diagnosis: NECK PAIN (ICD-723.1)    Route: IM    Site: RUOQ gluteus    Exp Date: 07/2011    Lot #: [id number]    Mfr: Novaplus    Patient tolerated injection without complications    Given by: Duard Brady LPN (August 23, 2009 4:51 PM)  Orders Added: 1)  Depo- Medrol 80mg  [J1040] 2)  Admin of Therapeutic Inj  intramuscular or subcutaneous [[id number]]

## 2010-08-14 ENCOUNTER — Ambulatory Visit
Admission: RE | Admit: 2010-08-14 | Discharge: 2010-08-14 | Disposition: A | Discharge status: Home / Self Care | Payer: Commercial Managed Care - PPO | Source: Ambulatory Visit | Attending: Obstetrics & Gynecology | Admitting: Obstetrics & Gynecology

## 2010-08-14 ENCOUNTER — Other Ambulatory Visit: Payer: Self-pay

## 2010-08-14 DIAGNOSIS — M545 Low back pain, unspecified: Secondary | ICD-10-CM

## 2010-08-14 DIAGNOSIS — Z0000 Encounter for general adult medical examination without abnormal findings: Secondary | ICD-10-CM

## 2010-08-14 DIAGNOSIS — M47817 Spondylosis without myelopathy or radiculopathy, lumbosacral region: Secondary | ICD-10-CM

## 2010-08-15 NOTE — Progress Notes (Signed)
  Phone Note Call from Patient Call back at Home Phone [phone]   Caller: Patient Summary of Call: Patient called and left a message reporting that the perles she received for her cough are not working since she started them Monday. She wants a Rx for Tussinex if possible. [facility] [place].   Initial call taken by: Lajean Saver RN,  July 18, 2010 7:02 PM     Appended Document:  Patient notified that Rx will be at the front desk for pick up.

## 2010-08-15 NOTE — Letter (Signed)
Summary: [facility]   Imported By: Maryln Gottron 07/22/2010 09:20:58  _____________________________________________________________________  External Attachment:    Type:   Image     Comment:   External Document

## 2010-08-15 NOTE — Assessment & Plan Note (Signed)
Summary: Change Medication  Rx written for Tussionex.  Will need to pick up at office, however (can't call in or electronically send controlled med) Donna Christen MD  July 18, 2010 7:18 PM    Prescriptions: Sandria Senter ER 8-10 MG/5ML LQCR (CHLORPHENIRAMINE-HYDROCODONE) 5 cc by mouth hs as needed cough  #2oz x 0   Entered and Authorized by:   Donna Christen MD   Signed by:   Donna Christen MD on 07/18/2010   Method used:   Print then Give to Patient   RxID:   [id number]  Patient notified that Rx will be available at front desk. Lajean Saver RN  July 18, 2010 7:20 PM

## 2010-08-15 NOTE — Letter (Signed)
Summary: [facility]   Imported By: Maryln Gottron 07/10/2010 10:17:51  _____________________________________________________________________  External Attachment:    Type:   Image     Comment:   External Document

## 2010-08-15 NOTE — Letter (Signed)
Summary: Out of Work  [facility]  [address]   [place], [place] [address]   Phone: [phone]  Fax: [phone]    July 15, 2010   Employee:  [name]    To Whom It May Concern:   For Medical reasons, please excuse the above named employee from work tomorrow.    If you need additional information, please feel free to contact our office.         Sincerely,    Donna Christen MD

## 2010-08-15 NOTE — Assessment & Plan Note (Signed)
Summary: CONGESTION,COUGH,SOB,HEADACHE,SORE THROAT,RUNNY NOSE/WSE (rm4)   Vital Signs:  Patient Profile:   55 Years Old Female CC:      congestion, cough, HA, ear pain x 3 days Height:     65 inches Weight:      121.25 pounds O2 Sat:      97 % O2 treatment:    Room Air Temp:     98.5 degrees F oral Pulse rate:   96 / minute Resp:     16 per minute BP sitting:   130 / 78  (left arm) Cuff size:   regular  Vitals Entered By: Lajean Saver RN (July 15, 2010 11:43 AM)                  Updated Prior Medication List: CARAFATE 1 GM/10ML  SUSP (SUCRALFATE) one tablespoon three times a day and at bedtime FIRST-MOUTHWASH BLM   SUSP (DPH-LIDO-ALHYDR-MGHYDR-SIMETH) 15-30 cc rinse and swallow as needed SYSTANE 0.4-0.3 %  SOLN (POLYETHYL GLYCOL-PROPYL GLYCOL) three times a day MIRALAX   POWD (POLYETHYLENE GLYCOL 3350) use as needed/as directed VIVELLE-DOT 0.075 MG/24HR  PTTW (ESTRADIOL) as directed FLEXERIL 10 MG TABS (CYCLOBENZAPRINE HCL) Take 1 tablet by mouth once a day as needed NEXIUM 40 MG  CPDR (ESOMEPRAZOLE MAGNESIUM) 1 capsule each day 30 minutes before meal PROCHLORPERAZINE MALEATE 10 MG TABS (PROCHLORPERAZINE MALEATE) 1 q 6 hours as needed nausea TRAZODONE HCL 50 MG TABS (TRAZODONE HCL) 1/2-1 by mouth q hs as needed insomnia ZOMIG 5 MG TABS (ZOLMITRIPTAN) one tablet orally at the time of headache.  May repeat x1 in one hour if headache persists. FISH OIL 1000 MG CAPS (OMEGA-3 FATTY ACIDS) once daily MULTIVITAMINS  TABS (MULTIPLE VITAMIN) once daily AMBIEN 10 MG TABS (ZOLPIDEM TARTRATE) Take 1 tablet by mouth at bedtime as needed LORAZEPAM 1 MG TABS (LORAZEPAM) take one to two as needed anxiety VENLAFAXINE HCL 75 MG XR24H-CAP (VENLAFAXINE HCL) Take 1 tablet by mouth once a day for 7 days and then 2 by mouth once daily DULCOLAX 5 MG  TBEC (BISACODYL) 1-4 once daily as needed CALCIUM CARBONATE 600 MG TABS (CALCIUM CARBONATE) Take 1200 mg daily * VITAMIN D3 2000IU once  daily AMITIZA 8 MCG  CAPS (LUBIPROSTONE) 1 two times a day/take with food and water MAGNESIUM 500 MG TABS (MAGNESIUM) Take 1 tablet by mouth once a day. ESTRADIOL 1 MG TABS (ESTRADIOL) Take 1 tablet by mouth once a day.  Current Allergies (reviewed today): ! PCN ! PHENERGAN ! DOXYCYCLINE ! MORPHINE SULFATE CR (MORPHINE SULFATE)History of Present Illness Chief Complaint: congestion, cough, HA, ear pain x 3 days History of Present Illness:  Subjective: Patient complains of URI symptoms for about 4 days.  She has a past history of pneumonia. + sore throat for 2 days + cough started last night, non-productive No pleuritic pain No wheezing + nasal congestion No post-nasal drainage No sinus pain/pressure No itchy/red eyes + right earache No hemoptysis No SOB No fever/chills, + sweats No nausea No vomiting No abdominal pain No diarrhea No skin rashes + fatigue + myalgias No headache Used OTC meds without relief   REVIEW OF SYSTEMS Constitutional Symptoms       Complains of fever and fatigue.     Denies chills, night sweats, weight loss, and weight gain.      Comments: low grade fever x 1 day- resolved Eyes       Denies change in vision, eye pain, eye discharge, glasses, contact lenses, and eye surgery. Ear/Nose/Throat/Mouth  Complains of ear pain, frequent runny nose, sinus problems, and hoarseness.      Denies hearing loss/aids, change in hearing, ear discharge, dizziness, frequent nose bleeds, sore throat, and tooth pain or bleeding.      Comments: sore throat resolved Respiratory       Complains of dry cough.      Denies productive cough, wheezing, shortness of breath, asthma, bronchitis, and emphysema/COPD.  Cardiovascular       Denies murmurs, chest pain, and tires easily with exhertion.    Gastrointestinal       Denies stomach pain, nausea/vomiting, diarrhea, constipation, blood in bowel movements, and indigestion. Genitourniary       Denies painful urination,  kidney stones, and loss of urinary control. Neurological       Complains of headaches.      Denies paralysis, seizures, and fainting/blackouts. Musculoskeletal       Complains of muscle pain.      Denies joint pain, joint stiffness, decreased range of motion, redness, swelling, muscle weakness, and gout.      Comments: Body aches Skin       Denies bruising, unusual mles/lumps or sores, and hair/skin or nail changes.  Psych       Denies mood changes, temper/anger issues, anxiety/stress, speech problems, depression, and sleep problems.  Past History:  Past Medical History: Multiple Sclerosis DISPROVEN  Depression H/O Gestational Hypertension H/O Blood Transfusion, 3 units- 1991 GERD Anxiety Arthritis IBS-C Interstitial Cystitis  Lupus  Past Surgical History: Reviewed history from 02/13/2007 and no changes required. Hysterectomy-endometriosis and fibroids-2007 Tonsillectomy S/P Removal of Morton's Neuroma  Family History: Reviewed history from 06/25/2007 and no changes required. mother alive with htn and lipids Family History High cholesterol Family History Hypertension Family History of CAD Female 1st degree relative <50 father but died of suicide Family History of Suicide attempt  one uncle with type 2 dm  Social History: Reviewed history from 02/11/2008 and no changes required. Occupation: Agricultural engineer Married 2 kids both healthy Never Smoked Alcohol use-no Regular exercise-no--too tired   Objective:  No acute distress  Eyes:  Pupils are equal, round, and reactive to light and accomdation.  Extraocular movement is intact.  Conjunctivae are not inflamed.  Ears:  Canals normal.  Tympanic membranes normal.   Nose:  Normal septum.  Normal turbinates, mildly congested.   No sinus tenderness present.  Pharynx:  Normal  Neck:  Supple.  Slightly tender shotty posterior nodes are palpated bilaterally.  Lungs:  Clear to auscultation.  Breath sounds are equal.  Heart:   Regular rate and rhythm without murmurs, rubs, or gallops.  Abdomen:  Nontender without masses or hepatosplenomegaly.  Bowel sounds are present.  No CVA or flank tenderness.  Extremities:  No edema.   Skin:  No rash  Assessment New Problems: RESPIRATORY DISORDER, ACUTE (ICD-465.9)  NO EVIDENCE BACTERIAL INFECTION TODAY.  Plan New Medications/Changes: CEFDINIR 300 MG CAPS (CEFDINIR) 1 by mouth q12hr (Rx void after 07/23/10)  #20 x 0, 07/15/2010, Donna Christen MD BENZONATATE 200 MG CAPS (BENZONATATE) One by mouth hs as needed cough  #12 x 0, 07/15/2010, Donna Christen MD  New Orders: New Patient Level III [phone] Pulse Oximetry (single measurment) [94760] Planning Comments:   Treat symptomatically for now:  Increase fluid intake, begin expectorant/decongestant, cough suppressant at bedtime.  If fever/chills/sweats persist, or if not improving 7 days begin Omnicef (given Rx to hold).  Followup with PCP if not improving 10 days.   The patient and/or caregiver has  been counseled thoroughly with regard to medications prescribed including dosage, schedule, interactions, rationale for use, and possible side effects and they verbalize understanding.  Diagnoses and expected course of recovery discussed and will return if not improved as expected or if the condition worsens. Patient and/or caregiver verbalized understanding.  Prescriptions: CEFDINIR 300 MG CAPS (CEFDINIR) 1 by mouth q12hr (Rx void after 07/23/10)  #20 x 0   Entered and Authorized by:   Donna Christen MD   Signed by:   Donna Christen MD on 07/15/2010   Method used:   Print then Give to Patient   RxID:   [id number] BENZONATATE 200 MG CAPS (BENZONATATE) One by mouth hs as needed cough  #12 x 0   Entered and Authorized by:   Donna Christen MD   Signed by:   Donna Christen MD on 07/15/2010   Method used:   Print then Give to Patient   RxID:   [id number]   Patient Instructions: 1)  Take Mucinex D (guaifenesin with  decongestant) twice daily for congestion. 2)  Increase fluid intake, rest. 3)  May use Afrin nasal spray (or generic oxymetazoline) twice daily for about 5 days.  Also recommend using saline nasal spray several times daily and/or saline nasal irrigation. 4)  Begin Omnicef if not improved one week or if persistent fever develops. 5)  Followup with family doctor if not improving 10 days  Orders Added: 1)  New Patient Level III [99203] 2)  Pulse Oximetry (single measurment) [28413]

## 2010-08-16 NOTE — Letter (Signed)
Summary: [facility]  [facility] Provider: This provider was preselected by the workflow.  Signature: The signature status of this document was preset by the workflow  Processed by [employer] Local Indexer Client @ Tuesday, July 03, 2009 10:29:32 AM using version:2010.1.2.11(2.4)   Manually Indexed By: [id number]  idlBatchDetail: [id number]   _____________________________________________________________________  External Attachment:    Type:   Image     Comment:   External Document

## 2010-08-19 ENCOUNTER — Other Ambulatory Visit (HOSPITAL_COMMUNITY): Payer: Commercial Managed Care - PPO

## 2010-08-21 ENCOUNTER — Ambulatory Visit (HOSPITAL_COMMUNITY)
Admission: RE | Admit: 2010-08-21 | Discharge: 2010-08-21 | Disposition: A | Discharge status: Home / Self Care | Payer: 59 | Source: Ambulatory Visit | Attending: Pain Medicine | Admitting: Pain Medicine

## 2010-08-21 DIAGNOSIS — M47817 Spondylosis without myelopathy or radiculopathy, lumbosacral region: Secondary | ICD-10-CM | POA: Insufficient documentation

## 2010-08-21 DIAGNOSIS — M545 Low back pain, unspecified: Secondary | ICD-10-CM | POA: Insufficient documentation

## 2010-08-21 DIAGNOSIS — M48061 Spinal stenosis, lumbar region without neurogenic claudication: Secondary | ICD-10-CM | POA: Insufficient documentation

## 2010-08-21 DIAGNOSIS — M519 Unspecified thoracic, thoracolumbar and lumbosacral intervertebral disc disorder: Secondary | ICD-10-CM | POA: Insufficient documentation

## 2010-08-21 DIAGNOSIS — M7130 Other bursal cyst, unspecified site: Secondary | ICD-10-CM | POA: Insufficient documentation

## 2010-08-21 NOTE — Letter (Signed)
Summary: [facility]   Imported By: Maryln Gottron 08/14/2010 09:08:49  _____________________________________________________________________  External Attachment:    Type:   Image     Comment:   External Document

## 2010-09-26 ENCOUNTER — Telehealth: Payer: Self-pay | Admitting: Internal Medicine

## 2010-09-26 LAB — POCT CARDIAC MARKERS
CKMB, poc: <1 ng/mL — ABNORMAL LOW (ref 1.0–8.0)
CKMB, poc: <1 ng/mL — ABNORMAL LOW (ref 1.0–8.0)
Myoglobin, poc: 52.1 ng/mL (ref 12–200)
Myoglobin, poc: 65.4 ng/mL (ref 12–200)
Troponin i, poc: <0.05 ng/mL (ref 0.00–0.09)
Troponin i, poc: <0.05 ng/mL (ref 0.00–0.09)

## 2010-09-26 LAB — CBC
HCT: 42.5 % (ref 36.0–46.0)
Hemoglobin: 14.5 g/dL (ref 12.0–15.0)
MCH: 31 pg (ref 26.0–34.0)
MCHC: 34 g/dL (ref 30.0–36.0)
MCV: 91 fL (ref 78.0–100.0)
Platelets: 197 10*3/uL (ref 150–400)
RBC: 4.67 MIL/uL (ref 3.87–5.11)
RDW: 13.5 % (ref 11.5–15.5)
WBC: 4.3 10*3/uL (ref 4.0–10.5)

## 2010-09-26 LAB — POCT I-STAT, CHEM 8
BUN: 13 mg/dL (ref 6–23)
Calcium, Ion: 1.19 mmol/L (ref 1.12–1.32)
Chloride: 107 mEq/L (ref 96–112)
Creatinine, Ser: 0.7 mg/dL (ref 0.4–1.2)
Glucose, Bld: 105 mg/dL — ABNORMAL HIGH (ref 70–99)
HCT: 46 % (ref 36.0–46.0)
Hemoglobin: 15.6 g/dL — ABNORMAL HIGH (ref 12.0–15.0)
Potassium: 4 mEq/L (ref 3.5–5.1)
Sodium: 141 mEq/L (ref 135–145)
TCO2: 25 mmol/L (ref 0–100)

## 2010-10-01 NOTE — Progress Notes (Signed)
Summary: Triage   Phone Note Call from Patient   Caller: Patient Call For: Dr. Leone Payor Reason for Call: Talk to Nurse Summary of Call: Patient is calling to speak with a nurse about a possible appt next week to see [facility] for severe nausea with occasional vomiting, says this is very unusual for her, she is upstairs in admitting, they are in training today but you can call her up there at 730 Initial call taken by: [place] Johnson,  September 26, 2010 8:37 AM  Follow-up for Phone Call        advised patient that not able to see her next week.  She says she will make an appointment for a later date.  She was offered an appointment with the extenders.  She declined for now. Follow-up by: Darcey Nora RN, CGRN,  September 26, 2010 2:13 PM

## 2010-10-22 ENCOUNTER — Ambulatory Visit (INDEPENDENT_AMBULATORY_CARE_PROVIDER_SITE_OTHER): Payer: Commercial Managed Care - PPO | Admitting: Internal Medicine

## 2010-10-22 ENCOUNTER — Telehealth: Payer: Self-pay | Admitting: *Deleted

## 2010-10-22 ENCOUNTER — Other Ambulatory Visit (INDEPENDENT_AMBULATORY_CARE_PROVIDER_SITE_OTHER): Payer: Commercial Managed Care - PPO

## 2010-10-22 ENCOUNTER — Encounter: Payer: Self-pay | Admitting: Internal Medicine

## 2010-10-22 VITALS — BP 96/66 | HR 68 | Wt 115.4 lb

## 2010-10-22 DIAGNOSIS — R1013 Epigastric pain: Secondary | ICD-10-CM

## 2010-10-22 LAB — COMPREHENSIVE METABOLIC PANEL WITH GFR
ALT: 28 U/L (ref 0–35)
AST: 35 U/L (ref 0–37)
Albumin: 3.9 g/dL (ref 3.5–5.2)
Alkaline Phosphatase: 62 U/L (ref 39–117)
BUN: 9 mg/dL (ref 6–23)
CO2: 29 meq/L (ref 19–32)
Calcium: 8.9 mg/dL (ref 8.4–10.5)
Chloride: 105 meq/L (ref 96–112)
Creatinine, Ser: 0.8 mg/dL (ref 0.4–1.2)
GFR: 77.16 mL/min
Glucose, Bld: 72 mg/dL (ref 70–99)
Potassium: 4.7 meq/L (ref 3.5–5.1)
Sodium: 140 meq/L (ref 135–145)
Total Bilirubin: 0.7 mg/dL (ref 0.3–1.2)
Total Protein: 6.5 g/dL (ref 6.0–8.3)

## 2010-10-22 LAB — CBC WITH DIFFERENTIAL/PLATELET
Basophils Absolute: 0 K/uL (ref 0.0–0.1)
Basophils Relative: 0.3 % (ref 0.0–3.0)
Eosinophils Absolute: 0.1 K/uL (ref 0.0–0.7)
Eosinophils Relative: 1.3 % (ref 0.0–5.0)
HCT: 43.1 % (ref 36.0–46.0)
Hemoglobin: 14.6 g/dL (ref 12.0–15.0)
Lymphocytes Relative: 19.3 % (ref 12.0–46.0)
Lymphs Abs: 0.9 K/uL (ref 0.7–4.0)
MCHC: 33.8 g/dL (ref 30.0–36.0)
MCV: 92.8 fl (ref 78.0–100.0)
Monocytes Absolute: 0.5 K/uL (ref 0.1–1.0)
Monocytes Relative: 10.6 % (ref 3.0–12.0)
Neutro Abs: 3.1 K/uL (ref 1.4–7.7)
Neutrophils Relative %: 68.5 % (ref 43.0–77.0)
Platelets: 163 K/uL (ref 150.0–400.0)
RBC: 4.65 Mil/uL (ref 3.87–5.11)
RDW: 14.2 % (ref 11.5–14.6)
WBC: 4.5 K/uL (ref 4.5–10.5)

## 2010-10-22 LAB — LIPASE: Lipase: 28 U/L (ref 11.0–59.0)

## 2010-10-22 LAB — CREATININE, SERUM
Creatinine, Ser: 0.76 mg/dL (ref 0.4–1.2)
GFR calc non Af Amer: >60 mL/min

## 2010-10-22 LAB — AMYLASE: Amylase: 43 U/L (ref 27–131)

## 2010-10-22 MED ORDER — ESOMEPRAZOLE MAGNESIUM 40 MG PO CPDR: DELAYED_RELEASE_CAPSULE | ORAL | Status: DC

## 2010-10-22 NOTE — Telephone Encounter (Signed)
Message copied by Jesse Fall on Tue Oct 22, 2010  3:36 PM ------      Message from: [place], [place]      Created: Tue Oct 22, 2010  1:32 PM       Pt called with normal labs

## 2010-10-22 NOTE — Progress Notes (Signed)
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 She has a history of gastroesophageal reflux and had a normal upper endoscopy in September 2007. She has been on Nexium and Carafate. She also has a history of functional constipation and irritable bowel syndrome and has been on MiraLax and Amitiza. There is a diagnosis of systemic lupus documented by positive double-stranded antibodies to DNA, synovitis, negative test for rheumatoid arthritis and Sjogren syndrome. She sees Dr. Dareen Piano and was put on prednisone 10 mg a day in January of this year together with Plaquenil 200 mg daily. Her prednisone was reduced to 5 mg a day 6 weeks ago. Her joint pains have improved significantly. However, her abdomen has become very tender and she has not been able to eat without pain. She has lost about 5 pounds. Her last colonoscopy in 2006 showed hemorrhoids. An upper abdominal ultrasound in October 2007 was negative. Other medical problems are interstitial cystitis and history of endometriosis. She is status post TAH.   Past Medical History  Diagnosis Date  . Depression   . GERD (gastroesophageal reflux disease)   . Anxiety   . Arthritis   . IBS (irritable bowel syndrome)     constipation predominant  . Interstitial cystitis   . Lupus   . Hemorrhoids    Past Surgical History  Procedure Date  . Abdominal hysterectomy 2007  . Tonsillectomy   . Excision morton's neuroma     reports that she has never smoked. She has never used smokeless tobacco. She reports that she does not drink alcohol or use illicit drugs. family history includes Coronary artery disease in her father; Diabetes in an unspecified family member; Diverticulosis in her mother; Hypertension in her mother; and Osteoarthritis in her mother. Allergies  Allergen Reactions  . Doxycycline     REACTION:  Rash  . Morphine Sulfate     REACTION: nausea and vomiting  . Penicillins   . Promethazine Hcl         Review of Systems: Positive for weakness. Abdominal pain. Unable to eat. Constipation and diarrhea. Denies chest pain or shortness of breath  The remainder of the 10  point ROS is negative except as outlined in H&P   Physical Exam: General appearance  Well developed, in no distress. Eyes- non icteric. HEENT nontraumatic, normocephalic. Mouth no lesions, tongue papillated, no cheilosis. Neck supple without adenopathy, thyroid not enlarged, no carotid bruits, no JVD. Lungs Clear to auscultation bilaterally. Cor  rapid S1 normal S2, regular rhythm , no murmur,  quiet precordium. Abdomen soft but very tender in the epigastrium in midline. Also tender around the umbilicus. No rebound. No palpable mass. Liver edge at costal margin. No CVA tenderness. Rectal: Normal perianal area. Normal rectal sphincter tone. Stool Hemoccult negative. Extremities no pedal edema. Skin no lesions. Neurological alert and oriented x 3. Psychological normal mood and affect.  Assessment and Plan:  Problems #1 Abdominal pain- This is consistent with gastritis, likely secondary to Plaquenil and prednisone.  The onset of epigastric pain corresponds with the start of these medications. Her synovitis and arthralgias have improved significantly. I suggest she stop both medications and she agrees with it in favor of treating her GI tract at this point. She is worried about being dehydrated. We will proceed with an upper endoscopy to rule out a steroid-induced ulcer or gastropathy. We will  also obtain labs to rule out pancreatitis. We will assess her for dehydration with a metabolic panel. She will increase her Nexium to 40 mg twice a day and her Carafate slurry to 10 cc by mouth 4 times a day.   Problem #2 systemic lupus. This was recently diagnosed and conformed by Dr. Dareen Piano. She will have to hold her  anti-arthritic medication at this point until her GI condition improves enough to restart them.   Problem #3 constipation. Patient has functional constipation with difficulty in evacuation. She will continue on MiraLax and Amitiza    as needed.   10/22/2010 [name]

## 2010-10-22 NOTE — Telephone Encounter (Signed)
Left a message for patient that labs are normal as per Dr. Juanda Chance.

## 2010-10-22 NOTE — Telephone Encounter (Signed)
Message copied by Jesse Fall on Tue Oct 22, 2010  3:34 PM ------      Message from: [place], [place]      Created: Tue Oct 22, 2010  1:32 PM       Pt called with normal labs

## 2010-10-22 NOTE — Patient Instructions (Addendum)
You have been scheduled for an endoscopy. Please see separate instructions given to you at your visit today. Your physician has requested that you go to the basement for the following lab work before leaving today: CBC, CMET, Amylase, Lipase Please increase your Nexium to 1 capsule by mouth twice daily. Samples have been given to you. Please discontinue the Plaquenil. Take carafate as directed.  Dr Dareen Piano, Rheum

## 2010-10-23 ENCOUNTER — Ambulatory Visit: Payer: Commercial Managed Care - PPO | Admitting: Internal Medicine

## 2010-10-23 ENCOUNTER — Encounter: Payer: Self-pay | Admitting: Internal Medicine

## 2010-10-23 DIAGNOSIS — K2970 Gastritis, unspecified, without bleeding: Secondary | ICD-10-CM

## 2010-10-23 DIAGNOSIS — R1013 Epigastric pain: Secondary | ICD-10-CM

## 2010-10-23 DIAGNOSIS — D1330 Benign neoplasm of unspecified part of small intestine: Secondary | ICD-10-CM

## 2010-10-23 DIAGNOSIS — R109 Unspecified abdominal pain: Secondary | ICD-10-CM

## 2010-10-23 DIAGNOSIS — K2990 Gastroduodenitis, unspecified, without bleeding: Secondary | ICD-10-CM

## 2010-10-23 DIAGNOSIS — R933 Abnormal findings on diagnostic imaging of other parts of digestive tract: Secondary | ICD-10-CM

## 2010-10-23 HISTORY — PX: UPPER GASTROINTESTINAL ENDOSCOPY: SHX188

## 2010-10-23 MED ORDER — SODIUM CHLORIDE 0.9 % IV SOLN: 500.0000 mL | INTRAVENOUS | Status: DC

## 2010-10-23 NOTE — Patient Instructions (Signed)
DC INSTRUCTIONS REVIEWED WITH PT. & CARE PARTNER . DISCUSSED SAFETY PRECAUTIONS DUE TO SEDATION TODAY AND GIVEN INFO. ON GASTRITIS AND TO DC PLAQUENIL & PREDNISONE PER DR BRODIE.

## 2010-10-24 ENCOUNTER — Telehealth: Payer: Self-pay

## 2010-10-24 NOTE — Telephone Encounter (Signed)

## 2010-10-28 ENCOUNTER — Encounter: Payer: Self-pay | Admitting: Internal Medicine

## 2010-10-29 ENCOUNTER — Other Ambulatory Visit: Payer: Self-pay | Admitting: Internal Medicine

## 2010-10-30 ENCOUNTER — Telehealth: Payer: Self-pay | Admitting: Internal Medicine

## 2010-10-30 NOTE — Telephone Encounter (Signed)
[facility] called and wanted the quantity of the Magic Mouthwash not the Carafate... Gave information and RX will be filled

## 2010-10-31 ENCOUNTER — Other Ambulatory Visit: Payer: Self-pay | Admitting: Internal Medicine

## 2010-10-31 DIAGNOSIS — K2970 Gastritis, unspecified, without bleeding: Secondary | ICD-10-CM

## 2010-10-31 MED ORDER — DPH-LIDO-ALHYDR-MGHYDR-SIMETH MT SUSP: OROMUCOSAL | Status: DC

## 2010-10-31 MED ORDER — HYOSCYAMINE SULFATE 0.125 MG SL SUBL: 0.1250 mg | SUBLINGUAL_TABLET | Freq: Four times a day (QID) | SUBLINGUAL | Status: DC | PRN

## 2010-10-31 MED ORDER — SUCRALFATE 1 GM/10ML PO SUSP: 1.0000 g | Freq: Two times a day (BID) | ORAL | Status: DC

## 2010-11-18 ENCOUNTER — Other Ambulatory Visit: Payer: Self-pay | Admitting: *Deleted

## 2010-11-18 DIAGNOSIS — G4700 Insomnia, unspecified: Secondary | ICD-10-CM

## 2010-11-18 MED ORDER — ZOLPIDEM TARTRATE 10 MG PO TABS: 10.0000 mg | ORAL_TABLET | Freq: Every evening | ORAL | Status: DC | PRN

## 2010-11-26 NOTE — Op Note (Signed)
NAMEKAEDENCE, CONNELLY              ACCOUNT NO.:  [id number]   MEDICAL RECORD NO.:  [id number]          PATIENT TYPE:  AMB   LOCATION:  SDC                           FACILITY:  [facility]   PHYSICIAN:  Freddy Finner, M.D.   DATE OF BIRTH:  [birth year]   DATE OF PROCEDURE:  01/21/2007  DATE OF DISCHARGE:                               OPERATIVE REPORT   PREOPERATIVE DIAGNOSES:  1. Complex left ovarian cyst, possible hemorrhagic versus      endometrioma.  2. Menopausal symptoms.   POSTOPERATIVE DIAGNOSIS:  Probable hemorrhagic cyst.   OPERATIVE PROCEDURE:  Laparoscopic bilateral salpingo-oophorectomy.   SURGEON:  Freddy Finner, M.D.   ANESTHESIA:  General.   ESTIMATED INTRAOPERATIVE BLOOD LOSS:  20 mL.   INTRAOPERATIVE COMPLICATIONS:  None.   The patient was admitted on the morning of surgery.  She was given  brought to the operating room and there placed under adequate general  anesthesia, placed in the dorsal lithotomy position using the [facility]  stirrup system.  Betadine prep of the abdomen and perineum was carried  out.  Bladder was evacuated with a Robinson catheter.  A sponge forceps  was placed into the vagina with two sponges at the tip.  Sterile drapes  were applied.  Two incisions were made, one at the umbilicus and one  just above the symphysis in the midline.  Through the upper incision the  11-mm bladeless disposable trocar was introduced.  Direct inspection  revealed adequate placement with no evidence of injury on entry.  Pneumoperitoneum was allowed to accumulate with carbon dioxide gas.  Scanning inspection of the upper abdomen revealed no apparent  abnormality.  The appendix was visualized and was normal.  The ovaries  appeared to be normal externally.  There was no fixation.  There were  some filmy adhesions from the rectosigmoid to the peritoneum, which were  lysed.  A second 11-mm trocar was placed through the lower incision.  Through it a spring-loaded grasping  forceps was used.  The Gyrus  tripolar device was used through the operating channel of the  laparoscope to seal and divide the infundibulopelvic ligament and  remaining attachments of the ovary and tube to the pelvic sidewall.  Each ovary was divided into half and each was retrieved through the  lower trocar sleeve using an __________  called a claw, which is a large  grasping forceps.  Irrigation was carried out.  Reinspection after  irrigation revealed complete hemostasis.  Photographs were made before  and after the ovary was removed as well as a picture of the appendix and  liver.  Inspection under reduced intra-abdominal pressure revealed  complete hemostasis.  The procedure at this point was terminated.  The  gas was allowed to escape.  The incisions were closed  with interrupted subcuticular sutures of 3-0 Dexon.  Steri-Strips were  applied to the lower incision.  Marcaine 0.25% was injected into the  incision sites for postoperative analgesia.  The patient was awakened  and taken to the recovery room in good condition.      Freddy Finner, M.D.  Electronically Signed     WRN/MEDQ  D:  01/21/2007  T:  01/21/2007  Job:  [id number]

## 2010-11-26 NOTE — H&P (Signed)
[name], [name]              ACCOUNT NO.:  [id number]   MEDICAL RECORD NO.:  [id number]          PATIENT TYPE:  AMB   LOCATION:  SDC                           [facility]   PHYSICIAN:  Freddy Finner, M.D.   DATE OF BIRTH:  10/18/1955   DATE OF ADMISSION:  01/21/2007  DATE OF DISCHARGE:                              HISTORY & PHYSICAL   ADMISSION DIAGNOSIS:  1. Persistent complex cystic left ovarian cyst.  2. Known pelvic endometriosis.  3. Chronic pelvic pain.   The patient is a 55 year old white married female, gravida 2, para 2,  who had significant GYN symptoms and problems and underwent  laparoscopically-assisted vaginal hysterectomy, fulguration of pelvic  endometriotic implants for fibroids, dysfunctional uterine bleeding,  dysmenorrhea.  At her request the ovaries were not removed at that time.  That procedure was done in April 2007.  This had actually been done for  menorrhagia and pain after a failed endometrial ablation done in 1999.  The patient had done relatively well for almost a year and presented  again with left lower quadrant pain and was found on ultrasound to have  a complex cystic left ovarian mass suggestive of hemorrhagic cyst versus  endometrioma.  This has failed to clear over a 50-month window of time.  The patient has requested definitive surgical intervention and is  admitted at this time for a laparoscopic bilateral salpingo-  oophorectomy.   Her current review of systems is essentially negative although  intermittently she does have symptoms of her new significant medical  condition, which is MS, for which she is taking Betaseron.  I think she  is on a number of medications for this condition.   Dictation ended at this point.      Freddy Finner, M.D.  Electronically Signed     WRN/MEDQ  D:  01/20/2007  T:  01/20/2007  Job:  [id number]

## 2010-11-26 NOTE — Assessment & Plan Note (Signed)
[facility]                         GASTROENTEROLOGY OFFICE NOTE   NAME:HODGESVaughn, Frieze                     MRN:          [id number]  DATE:01/12/2007                            DOB:          01/09/1956    [name] is a patient of mine with irritable bowel syndrome.  She is  currently on interferon therapy for multiple sclerosis as well.  In the  past, I have prescribed Ambien to help her sleep. Recently she has been  on Ambien CR, but that makes her nauseous.  I am prescribing Ambien 10  mg at bedtime as needed, #30, with 3 refills.     Iva Boop, MD,FACG  Electronically Signed    CEG/MedQ  DD: 01/12/2007  DT: 01/13/2007  Job #: [id number]

## 2010-11-26 NOTE — H&P (Signed)
[name], [name]              ACCOUNT NO.:  [id number]   MEDICAL RECORD NO.:  [id number]          PATIENT TYPE:  AMB   LOCATION:  SDC                           [facility]   PHYSICIAN:  Freddy Finner, M.D.   DATE OF BIRTH:  1956-05-24   DATE OF ADMISSION:  DATE OF DISCHARGE:                              HISTORY & PHYSICAL   CONTINUATION:  This is a continuation of the past medical history.   ADDITIONAL MEDICAL PROBLEMS:  1. Irritable bowel syndrome.  2. Situational depression.  3. ADD.   CURRENT MEDICATIONS:  In addition to her Betaseron are  1. Prozac 60 mg a day.  2. Carbitol 200 mg a day.  3. Black kohash 40 mg a day.  4. Neurontin 100 mg t.i.d.  5. Zofran oral dissolving tablets as needed.  6. Copaxone 20 mg a day.  7. Mannitol 40 mg a day.  8. Baclofen 10 mg p.r.n.  9. Ritalin 5 mg b.i.d.   ALLERGIES:  She has known drug allergies to PENICILLIN, PHENERGAN and  MORPHINE.   PAST MEDICAL HISTORY:  1. She did have a blood transfusion after the birth of her first      child.  She has had two vaginal births without other known      complications.  2. She had resection of a lipoma of her right shoulder in 1981.  3. She also has had a resection of a neuroma of her right foot.  4. She had the uterine ablation procedure described above.  5. She had a laparoscopically assisted vaginal hysterectomy.   FAMILY HISTORY:  Noncontributory.   PHYSICAL EXAMINATION:  HEENT:  Grossly within normal limits.  NECK:  Thyroid gland is not palpably enlarged.  CHEST:  Clear to auscultation.  CARDIOVASCULAR:  Normal sinus rhythm without murmurs, rubs, or gallops.  ABDOMEN:  Soft and nontender without appreciable organomegaly or  palpable masses.  BREASTS:  Normal. No skin change, nipple discharge or palpable masses.  PELVIC:  External genitalia, vagina and vaginal cuff are normal to  inspection and palpation.  Bimanual reveals no definite palpable mass,  although a suggestion of  fullness on the left.  RECTAL:  Rectum is palpably normal.  Rectovaginal exam confirms.  EXTREMITIES:  Without clubbing, cyanosis, or edema.   ASSESSMENT:  Numerous medical problems, recurrent gynecological problem  with known previous pelvic endometriosis, now with a persistent left  adnexal mass consistent with a hemorrhagic ovarian cyst or an  endometrioma of the left ovary.   PLAN:  Laparoscopic bilateral salpingo-oophorectomy.      Freddy Finner, M.D.  Electronically Signed     WRN/MEDQ  D:  01/20/2007  T:  01/20/2007  Job:  [id number]

## 2010-11-26 NOTE — Assessment & Plan Note (Signed)
[facility]                         GASTROENTEROLOGY OFFICE NOTE   NAME:HODGESCarmell, Carol                       MRN:          [id number]  DATE:05/18/2007                            DOB:          December 25, 1955    [name] has been complaining of some sore throat and mouth ulcers.  This  comes and goes.  It has been a problem for about a year.  She has tried  Zegerid for 3 months as part of a reflux study.  That was not helpful.  She had an endoscopy which was unrevealing.   On exam today, she has a few scalloped areas on lateral aspect of the  tongue.  She says this is less severe than yesterday.   On review of her medications, she is on Copaxone, Tegretol, and Ambien  at this point.   She has multiple sclerosis for which the Copaxone is given.   A side effect of that is esophagitis and stomatitis.   It think the problem is the stomatitis and esophagitis from the  Copaxone, perhaps more stomatitis.   PLAN:  Trial of Magic Mouth Wash type preparation with lidocaine,  Benadryl, and Mylanta as well as Carafate. She will let me know how this  does.  I do not think proton pump inhibitor is indicated.     Iva Boop, MD,FACG  Electronically Signed    CEG/MedQ  DD: 05/18/2007  DT: 05/19/2007  Job #: [id number]

## 2010-11-29 NOTE — Discharge Summary (Signed)
NAMEZAKIRAH, WEINGART              ACCOUNT NO.:  [id number]   MEDICAL RECORD NO.:  [id number]          PATIENT TYPE:  OIB   LOCATION:  9320                          FACILITY:  [facility]   PHYSICIAN:  Freddy Finner, M.D.   DATE OF BIRTH:  [birth year]   DATE OF ADMISSION:  10/14/2005  DATE OF DISCHARGE:  10/15/2005                                 DISCHARGE SUMMARY   DISCHARGE DIAGNOSIS:  1.  Uterine leiomyomata.  2.  Uterine adenomyosis.  3.  Clinical symptoms of menorrhagia, severe dysmenorrhea.   OPERATIVE AND POSTOPERATIVE COMPLICATIONS:  None.   DISPOSITION:  The patient was in satisfactory and fair condition on the  first postoperative day.  She was discharged home to progressively  increasing physical activity __________ heavy lifting.  She is to report  fever or heavy bleeding.  She was given Percocet 5/325 to be taken 1 or 2  q.4h. p.r.n. for postoperative pain.  She is to return to the office in  approximately 2 weeks for postoperative followup.   Details of the present illness, past history, family history, review of  systems and physical exam recorded in the admission note.  Pelvic findings  were remarkable for enlargement of the uterus.  Clinical symptoms were noted  above.   LABORATORY DATA DURING THIS ADMISSION:  Included preoperative CBC with  hemoglobin of 14.4, white count of 6.5.  Postoperative hemoglobin was 11.5,  white count 9.5.  Prothrombin time, PTT and INR were normal.   The patient was admitted on the morning of surgery.  She was given an  antibiotic bolus.  She was brought to the operating room.  She was placed in  PAS anti-embolic hose.  __________ procedure was accomplished without  difficulty or intraoperative complications.  Her postoperative course was  completely satisfactory, she remained afebrile.  By the time of her  discharge she was having adequate bowel and bladder function, tolerating a  regular diet.  She was discharged home with disposition as  noted above.      Freddy Finner, M.D.  Electronically Signed     WRN/MEDQ  D:  11/30/2005  T:  12/01/2005  Job:  [id number]

## 2010-11-29 NOTE — Op Note (Signed)
[name], [name]              ACCOUNT NO.:  [id number]   MEDICAL RECORD NO.:  [id number]          PATIENT TYPE:  AMB   LOCATION:  SDC                           [facility]   PHYSICIAN:  Freddy Finner, M.D.   DATE OF BIRTH:  [birth year]   DATE OF PROCEDURE:  10/14/2005  DATE OF DISCHARGE:                                 OPERATIVE REPORT   PREOPERATIVE DIAGNOSES:  1.  Fibroids.  2.  Suspected adenomyosis.  3.  Suspected endometriosis.  4.  Failed endometrial ablation.   POSTOPERATIVE DIAGNOSES:  1.  Fibroids.  2.  Suspected adenomyosis.  3.  Suspected endometriosis.  4.  Failed endometrial ablation.  5.  Distinct powder burn lesion on the left uterosacral ligament.  6.  Fibroids, endometriosis pending histology.   OPERATIVE PROCEDURE:  1.  Laparoscopically-assisted vaginal hysterectomy.  2.  Fulguration of endometriotic lesion of the left uterosacral.   SURGEON:  W. Varney Baas, M.D.   ASSISTANT:  Zelphia Cairo, M.D.   ANESTHESIA:  General endotracheal.   INTRAOPERATIVE COMPLICATIONS:  None.   ESTIMATED INTRAOPERATIVE BLOOD LOSS:  Less than 100 mL.   The patient was admitted on the morning of surgery.  She was placed in  antiembolic compression hose.  She was given a bolus of Rocephin 1 g IV  preoperatively.  She was taken to the operating room, placed under adequate  general anesthesia, placed in the dorsal lithotomy position with the use of  the Allen stirrup system.  A Betadine prep using scrub followed by solution  was carried out of the abdomen, perineum and vagina.  The bladder was  evacuated with a Robinson-type catheter.  A Hulka tenaculum was attached to  the cervix under direct visualization.  Sterile drapes were applied and two  small incisions were made in the abdomen, one at the umbilicus, one just  above the symphysis.  Through the upper incision an 11 mm nonbladed  disposable trocar was introduced while elevating the anterior abdominal wall  manually.  Direct inspection revealed adequate placement with no evidence of  injury.  On entering, pneumoperitoneum was allowed to accumulate using  carbon dioxide gas.  A 5 mm trocar was placed through the lower incision  under direct visualization and through it a blunt probe was used for  traction and exposure.  Systematic examination of the abdomen and pelvis was  carried out.  No apparent abnormalities were noted in the upper abdomen.  The appendix was visualized and was normal.  The findings noted as  postoperative diagnoses or findings in the pelvis.  Both tubes and ovaries  appeared to be completely healthy with no evidence of endometriotic lesions.  There were no pelvic adhesions.  Photographs were made of the findings, and  they were retained in the office record as were the photographs at the  postoperative inspection.  The tripolar Gyrus device was then used to  develop the round ligament and utero-ovarian ligament, the upper broad  ligament down to the level of the vessels on each side.  Progressive  pedicles were developed, sealed and divided sharply.  Attention  was then  turned vaginally.  A posterior weighted vaginal retractor was placed.  Deaver retractors were used anteriorly and laterally for exposure.  The  cervix was grasped with a Jacobs tenaculum after removing the Hulka  tenaculum.  A posterior colpotomy incision was made while tenting the mucosa  posterior to the cervix.  The cervix was circumscribed with a scalpel.  Using the Gyrus Heaney-style clamp, the uterosacral pedicles were sealed and  divided.  The bladder pillars were then sealed and divided.  The bladder was  carefully advanced off the cervix and the anterior peritoneum entered.  The  cardinal ligament pedicles were sealed and divided with the Gyrus device, as  were the uterine artery pedicles.  The uterus was delivered through the  vaginal introitus.  The small remaining pedicle on each side was sealed  and  divided with the Gyrus device.  Angles of the vagina were then anchored to  the uterosacrals with a mattress suture of 0 Monocryl.  Uterosacrals were  plicated and the posterior peritoneum closed with an interrupted 0 Monocryl.  The cuff was closed with running __________ figure-of-eights of 0 Monocryl.  A Foley catheter was inserted.  Reinspection laparoscopically was then  carried out using the Nezhat system.  There was no active intra-abdominal  bleeding on any pedicles.  Irrigating solution was aspirated from the  abdomen.  Gas was allowed to escape from the abdomen.  The instruments were  removed.  The skin incisions were anesthetized locally with 0.5% plain  Marcaine.  They were then closed with interrupted subcuticular sutures of 3-  0 Dexon.  Steri-Strips were applied to the lower incision.  The patient was  awakened and taken to the recovery room in good condition.      Freddy Finner, M.D.  Electronically Signed     WRN/MEDQ  D:  10/14/2005  T:  10/14/2005  Job:  [id number]

## 2010-11-29 NOTE — Assessment & Plan Note (Signed)
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 Cherre Blanc, a neurologist at [facility], concerned with hypoxemia.   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  She says for years she has had a sense of daytime fatigue,  which would come and go.  This finally led to a diagnosis of multiple  sclerosis.  Her husband tells her she moves a lot in her sleep, which she  relates to being uncomfortable.  He does not tell her that she snores.  She  relates a few episodes when she has woken with a frightening sense of being  smothered, but without choking, coughing, or strangling.  In the past 6  months, this has been happening more frequently, as often as twice a night,  but there is no pattern that she recognizes.  She has noted a little  wheezing only with bronchitis and has a history of frequent bronchitis,  sinusitis, and some allergic rhinitis.  Otherwise, she has not had a history  of lung disease.  An overnight oxymetry run on April 29, 2006 at the  request of Dr. Jonny Ruiz records room air oxygen saturation on that study night  with oxygen saturation 90% or greater for 99.7% of the study.  Lowest  saturation was 88%, recorded for 0.1% of the study night.  The tracings do  not show significant sawtooth variation, although there are some intervals  where saturation does seem to flicker up and down by a few percentage  points.  She reports bed time between 10 and 10:30 p.m., estimating a sleep  latency of 10 to 15 minutes and waking 2 to 3 times during the night before  up at 6 a.m. for work.  Has a 70 year old child.  She remembers still having  some enuresis and sleep walking.  Those  behaviors ended in her 11th year.  She denies symptoms suggestive of sleep paralysis or cataplexy.  An  occasional half of a 10 mg Ambien tablet has been tried on a few occasions,  but made her groggy the next day.  This was tried in an effort to  consolidate night time sleep, as a treatment for her complaint of daytime  fatigue.  Naps on weekends, up to an hour or so, do seem to make her feel  better, but she does not describe irresistible sleepiness.   MEDICATIONS:  1. Prozac 20 mg.  2. Betaseron.  3. Provigil 1/4 of a 200 mg tablet.  4. Ambien 1/2 of a 10 mg tablet used very occasionally.  5. Tylenol and Advil occasionally.   DRUG INTOLERANCES:  PENICILLIN, PHENERGAN, DOXYCYCLINE causes rash.   REVIEW OF SYSTEMS:  Her weight has been stable.  Occasional trouble falling  asleep.  Her primary complaint is a sense of fatigue  or sleepiness,  especially if inactive, or sometimes while driving, and non-restorative  sleep.  Occasional indigestion, depression.  Daytime wheeze, cough, and  shortness of breath associated only with episodes of bronchitis.  No  confusion or syncope, palpitation, or chest pain, purulent or bloody  discharge, rash, joint pain, or ankle edema.   PAST HISTORY:  Recent diagnosis of multiple sclerosis of the brain stem.  Frequent episodic bronchitis, sinusitis, allergic rhinitis.  Wheeze  associated only with bronchitis.  Tonsils and adenoids removed.  Hysterectomy in April of 2007.  She took prednisone most recently in  September with an episode of bronchitis.   SOCIAL HISTORY:  Married with children.  There is occasional social alcohol.  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  to 6 p.m.  She is a race car driver on weekends.   FAMILY HISTORY:  Father snored a lot.  Nobody diagnosed with sleep apnea.  Some with allergies and asthma.  Father, mother, and  grandparents with heart  disease.  Mother with pancreatic cancer.   OBJECTIVE:  Weight 119 pounds, BP 128/74, pulse regular 71, room air  saturation 100%.  Soft-spoken, slender, comfortable-appearing woman.  Pleasant, alert and  oriented.  SKIN:  No obvious rash.  ADENOPATHY:  None at the neck, shoulders, or axillae.  HEENT:  Nasal airway without mucus or polyps seen.  Thin posteriorly placed  palate with pharyngeal spacing 2/4.  Normal mandible.  No stridor, neck vein  distension, or thyromegaly.  The patient is breathing comfortably with her  mouth closed.  CHEST:  Quiet, clear, and unlabored.  HEART:  Regular rhythm.  Normal S1, S2.  No murmur or gallop.  EXTREMITIES:  No cyanosis, clubbing, or edema, or tremor.   IMPRESSION:  1. Initial reported concern of nocturnal hypoxemia not supported by      overnight oxymetry.  She has had episodes where she wakes uncomfortable      with her breathing.  Whether these are central apneas or even      obstructive apneas frequent enough to be detectable with an overnight      sleep study is not yet clear.  2. Complaint of daytime tiredness or fatigue.  I am not sure if this could      reflect her multiple sclerosis or is actually sleepiness.  I have given      her an Epworth sleepiness scale to complete, and we are going to let      her try Lunesta samples 2 mg #8 to see whether, by comparison with      Ambien, she notices any change in sleep quality, frequency of events,      or her sense of daytime fatigue.  I have also suggested the possibility      of caffeine tablets during the daytime.  She will return in 3 weeks for      followup and, based on her experience over that period of time, we will      make a decision about a formal sleep study.  I appreciate the chance to      see her.     Clinton D. Maple Hudson, MD, Tonny Bollman, FACP  Electronically Signed    CDY/MedQ  DD: 05/31/2006  DT: 05/31/2006  Job #: [id number]   cc:   Dr. Darlyne Russian

## 2010-11-29 NOTE — H&P (Signed)
[name], [name]              ACCOUNT NO.:  [id number]   MEDICAL RECORD NO.:  [id number]          PATIENT TYPE:  AMB   LOCATION:  [facility]                           FACILITY:  [facility]   PHYSICIAN:  Freddy Finner, M.D.   DATE OF BIRTH:  28-May-1956   DATE OF ADMISSION:  10/14/2005  DATE OF DISCHARGE:                                HISTORY & PHYSICAL   ADMITTING DIAGNOSES:  1.  Uterine fibroid.  2.  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  3.  Endometrial polyp.  4.  Failed endometrial ablation, done in 1999, with present symptoms of      dysmenorrhea and menometrorrhagia.  5.  History of cervical dysplasia, with conization of cervix.   The patient is a 55 year old white married female, gravida 2, para 2, with  significant history of GYN problems over time.  She had endometrial ablation  apparently done by rollerball technique in 1999 while she was living in  [place].  She initially presented for evaluation in our office in July 2006  and was seen by Julio Sicks, our nurse practitioner.  She is newly married  and was requesting contraceptive assistance.  Examination in the office at  that time and subsequent sonohystogram have outlined her pelvic findings.  The sonohystogram was performed on February the 22nd of this year, at which  time she was noted to have a subserosal fibroid.  She had an inhomogeneous  myometrium, consistent with adenomyosis, and had a 5.7-mm density within the  endometrial cavity.  Given her complaints of menorrhagia and some episodes  of irregular bleeding, hysteroscopy and D&C were discussed with her.  Based  on her history of previous endometrial ablation with failure, she has  requested more definitive surgical intervention.  Options have been  discussed, and she has chosen to proceed with laparoscopically assisted  vaginal hysterectomy.  After very careful consultation with the patient, she  has definitely requested to leave her ovaries unless they are absolutely  unhealthy or are abnormal in some way.  She is admitted at this time for  that surgical procedure.  Significant past medical history related to GYN  issues is defined in the present illness.  She currently has a negative  review of systems except as above.  There are no cardiopulmonary, GI or GU  complaints.   She is noted to have irritable bowel syndrome.  She has a history of urinary  tract infections in the past.  Previous surgical procedures include  resection of a neuroma of her right foot.  In 1981, she had resection of a  lipoma of her right shoulder.  She had the uterine endometrial ablation in  1999.  She has had two vaginal births.  She is allergic to PENICILLIN and  PHENERGAN but no other known drug allergies.  She is not a smoker.  She has  had previous blood transfusions.  She has been having regular mammograms,  including a normal mammogram in November 2006.  Her only current chronic  medications are Ambien, which she takes occasionally as needed.  She was  tried on a Yas combination, 24-day-format oral  contraceptive for attempts to  control her abnormal bleeding.  Other options were discussed with her,  including Mirena, which she chose not to pursue.  Family history is positive  for cancer of unspecified type, for diabetes, for hypertension and for heart  disease and kidney disease.   PHYSICAL EXAMINATION:  VITAL SIGNS:  Blood pressure in the office 102/74.  Height 5-feet-3.  Weight 122.  HEENT:  Grossly within normal limits.  NECK:  Thyroid gland is not palpably enlarged.  BREASTS:  Considered to be normal.  No palpable masses.  No skin change or  nipple discharge.  CHEST:  Clear to auscultation.  HEART:  Sinus rhythm without murmurs, rubs or gallops.  ABDOMEN:  Soft and nontender without appreciable organomegaly or palpable  masses.  PELVIC:  External genitalia were normal.  Vaginal exam was considered to be  normal.  The bimanual revealed the uterus to be slightly  enlarged.  There  were no palpable adnexal masses.  RECTAL:  The rectum was palpably normal, and rectovaginal exam confirmed the  above findings.  EXTREMITIES:  Without cyanosis, clubbing or edema.   ASSESSMENT:  1.  Uterine fibroids.  2.  Probable adenomyosis, with secondary clinical symptoms.  3.  Failed endometrial ablation.  4.  History of cervical dysplasia, with recently normal Pap tests.   PLAN:  Laparoscopically assisted vaginal hysterectomy.  The patient has  reviewed a video in the office describing the operative procedure.  She is  very adamant about wanting to preserve her ovarian cysts if at all possible.      Freddy Finner, M.D.  Electronically Signed     WRN/MEDQ  D:  10/13/2005  T:  10/14/2005  Job:  [id number]

## 2010-11-29 NOTE — Assessment & Plan Note (Signed)
[facility]                         GASTROENTEROLOGY OFFICE NOTE   NAME:HODGESAdlean, Hardeman                     MRN:          [id number]  DATE:05/27/2007                            DOB:          July 10, 1956    Amelie has been having some stomatitis issues related to her multiple  sclerosis therapy.  She talked to her neurologist who said that it would  likely come and go.  He has suggested a proton pump inhibitor to try to  help.  She has been on Zegrid in the past without relief.  We are going  to try samples of Prevacid 30 mg daily.     Iva Boop, MD,FACG  Electronically Signed    CEG/MedQ  DD: 05/27/2007  DT: 05/28/2007  Job #: [id number]

## 2010-11-29 NOTE — Assessment & Plan Note (Signed)
[facility]                           GASTROENTEROLOGY OFFICE NOTE   NAME:HODGESMakila, [name]                     MRN:          [id number]  DATE:05/04/2006                            DOB:          [birth year]    CHIEF COMPLAINT:  Acute epigastric pain.   HISTORY:  [name] is a pleasant 55 year old white female known to Dr.  Christella Hartigan, who had most recently been seen and endoscoped on March 03, 2006 as  part of a GERD study.  She was noted to have mild erosive esophagitis on  initial endoscopy January 30, 2006, and then on followup 1 month later findings  were normal.  She has been on Zegerid 20 mg per day.  The patient also has a  history of MS, which has been dormant the past couple of years, but says  that, over the past 6 weeks, she has been experiencing some generalized  achiness and fatigue, and has an appointment at [facility] with neurology  tomorrow.  She also had a hysterectomy within the past couple of months.   The patient relates that she had the abrupt onset of her symptoms last  evening after a car race, which she and her husband participated in.  She  says it started in the upper abdomen.  Describes it as a full gnawing and  sharp discomfort, like a labor pain.  She says that she ate dinner, felt a  little bit better for a brief period of time, and then her symptoms started  back again and were fairly intense for about 3 hours.  She did not have any  associated nausea, vomiting, or diarrhea.  No fever or chills.  The symptoms  eased off and she went to bed.  She says she felt okay this morning, was  able to eat breakfast, and then, after eating a lunch of soup at noon, her  pain started back again.  It has been constant all afternoon.  At this time,  it has been present for about 5 hours, and she says at its worst, was about  a 9/10 and has now eased off to a 7/10.  Her pain is in the exact same  location in the epigastrium without radiation.   Describes it as a full and  sharp type of feeling.  Her bowel habits have been normal.  She denies any  aspirin or NSAID use.   CURRENT MEDICATIONS:  1. Zegerid 20 p.o. q. day.  2. Prozac 20 q. day.  3. Tylenol p.r.n.   ALLERGIES:  PENICILLIN and PHENERGAN, which causes jerking.   PAST HISTORY:  1. Pertinent for MS, which has been dormant.  2. Status post recent hysterectomy.  Ovaries are intact.  She has had a      Morton's neuroma removed and is status post T&A.   SOCIAL HISTORY:  The patient is married and has 2 children.  No tobacco.  Occasional EtOH.  She is employed as a Engineer, civil (consulting) at [employer].   FAMILY HISTORY:  Pertinent for lupus in an aunt.   REVIEW OF SYSTEMS:  CARDIOVASCULAR:  No chest pain  or anginal symptoms.  PULMONARY:  No cough, shortness of breath, or sputum production.  GENITOURINARY:  Negative.  GI:  As above.   PHYSICAL EXAM:  Well-developed white female in no acute distress.  Temperature is 98.3, blood pressure 100/50, pulse is 72.  She is anicteric.  CARDIOVASCULAR:  Regular rate and rhythm with S1 and S2.  No murmur, rub, or  gallop.  PULMONARY:  Clear to A and P.  ABDOMEN:  Soft.  Bowel sounds are active.  She is minimally tender in the  epigastrium.  There is no guarding or rebound.  No mass or  hepatosplenomegaly.  No CVA tenderness.  RECTAL:  Exam is not done at this time.   IMPRESSION:  1. A 55 year old white female with intermittent, episodic, severe      epigastric pain, most consistent with biliary colic.  2. Gastroesophageal reflux disease.  3. History of multiple sclerosis.   PLAN:  1. Check CBC, CMET, and lipase today.  2. Abdominal ultrasound within the next 24 hours.  3. Clear liquid diet until ultrasound.  4. Vicodin 5/500 q.4 to 6 hours p.r.n. pain.      ______________________________  Mike Gip, PA-C    ______________________________  Carol Lick. Russella Dar, MD, Clementeen Graham     AE/MedQ  DD:  05/04/2006  DT:  05/05/2006  Job #:   [id number]

## 2010-12-05 NOTE — Telephone Encounter (Signed)
Questions previously addressed

## 2010-12-10 ENCOUNTER — Other Ambulatory Visit: Payer: Self-pay | Admitting: Internal Medicine

## 2011-02-05 ENCOUNTER — Other Ambulatory Visit: Payer: Self-pay | Admitting: Rheumatology

## 2011-02-05 DIAGNOSIS — M545 Low back pain, unspecified: Secondary | ICD-10-CM

## 2011-02-05 DIAGNOSIS — M47817 Spondylosis without myelopathy or radiculopathy, lumbosacral region: Secondary | ICD-10-CM

## 2011-02-12 ENCOUNTER — Ambulatory Visit
Admission: RE | Admit: 2011-02-12 | Discharge: 2011-02-12 | Disposition: A | Discharge status: Home / Self Care | Payer: Commercial Managed Care - PPO | Source: Ambulatory Visit | Attending: Rheumatology | Admitting: Rheumatology

## 2011-02-12 ENCOUNTER — Other Ambulatory Visit: Payer: Self-pay | Admitting: Rheumatology

## 2011-02-12 DIAGNOSIS — M47817 Spondylosis without myelopathy or radiculopathy, lumbosacral region: Secondary | ICD-10-CM

## 2011-02-12 DIAGNOSIS — M545 Low back pain, unspecified: Secondary | ICD-10-CM

## 2011-02-12 MED ORDER — METHYLPREDNISOLONE ACETATE 40 MG/ML INJ SUSP (RADIOLOG
120.0000 mg | Freq: Once | INTRAMUSCULAR | Status: AC
  Administered 2011-02-12: 120 mg via INTRA_ARTICULAR

## 2011-02-12 MED ORDER — IOHEXOL 180 MG/ML  SOLN
1.0000 mL | Freq: Once | INTRAMUSCULAR | Status: AC | PRN
  Administered 2011-02-12: 1 mL via INTRAVENOUS

## 2011-02-12 NOTE — Discharge Instructions (Signed)

## 2011-02-27 ENCOUNTER — Encounter: Payer: Self-pay | Admitting: Family Medicine

## 2011-02-27 ENCOUNTER — Ambulatory Visit (INDEPENDENT_AMBULATORY_CARE_PROVIDER_SITE_OTHER): Payer: Commercial Managed Care - PPO | Admitting: Family Medicine

## 2011-02-27 DIAGNOSIS — H9209 Otalgia, unspecified ear: Secondary | ICD-10-CM

## 2011-02-27 NOTE — Patient Instructions (Signed)
Follow up immediately for any fever or worsening symptoms. 

## 2011-02-27 NOTE — Progress Notes (Signed)
  Subjective:    Patient ID: [name], female    DOB: [birth year], 55 y.o.   MRN: [id number]  HPI Right ear discomfort for 2 days. Patient denies any hearing changes, vertigo, significant nasal congestion, fever, chills. No recent swimming. No altitude change. Possibly exacerbated by supine position. No relief with decongestants, antihistamines, anti-wax drops or hydrogen peroxide. She thought this may have been cerumen impaction. Denies active allergy symptoms. Also relates mild sore throat past couple days. No cough.  Patient recently diagnosed with lupus. On methotrexate and plaquenil. No oral lesions noted by patient.   Review of Systems  Constitutional: Negative for fever and chills.  HENT: Positive for sore throat. Negative for hearing loss, ear pain, congestion, mouth sores and sinus pressure.   Respiratory: Negative for cough and shortness of breath.   Hematological: Negative for adenopathy.       Objective:   Physical Exam  Constitutional: She appears well-developed and well-nourished. No distress.  HENT:  Right Ear: External ear normal.  Left Ear: External ear normal.  Mouth/Throat: Oropharynx is clear and moist. No oropharyngeal exudate.       No mastoid tenderness or erythema  Previous tonsillectomy. No oral lesions  Neck: Neck supple.  Cardiovascular: Normal rate and regular rhythm.   Pulmonary/Chest: Effort normal and breath sounds normal. No respiratory distress. She has no wheezes. She has no rales.  Lymphadenopathy:    She has no cervical adenopathy.  Skin: No rash noted.          Assessment & Plan:  Right otalgia with basically normal exam. Suspect eustachian tube dysfunction or possibly small right ear effusion. No visible fluid seen. Observe for now. No indication for antibiotics.

## 2011-03-05 ENCOUNTER — Other Ambulatory Visit: Payer: Self-pay | Admitting: Internal Medicine

## 2011-03-06 ENCOUNTER — Other Ambulatory Visit: Payer: Self-pay | Admitting: Gastroenterology

## 2011-03-06 MED ORDER — DPH-LIDO-ALHYDR-MGHYDR-SIMETH MT SUSP: OROMUCOSAL | Status: DC

## 2011-03-06 NOTE — Telephone Encounter (Signed)
Medication refilled

## 2011-03-11 ENCOUNTER — Other Ambulatory Visit: Payer: Self-pay | Admitting: Internal Medicine

## 2011-03-11 DIAGNOSIS — R1013 Epigastric pain: Secondary | ICD-10-CM

## 2011-03-11 DIAGNOSIS — K219 Gastro-esophageal reflux disease without esophagitis: Secondary | ICD-10-CM

## 2011-03-11 MED ORDER — ESOMEPRAZOLE MAGNESIUM 40 MG PO CPDR: 40.0000 mg | DELAYED_RELEASE_CAPSULE | Freq: Every day | ORAL | Status: DC

## 2011-03-11 NOTE — Assessment & Plan Note (Signed)
She called for refill of Nexium and it was ordered.

## 2011-04-07 LAB — INFLUENZA A AND B ANTIGEN (CONVERTED LAB)
Inflenza A Ag: NEGATIVE
Influenza B Ag: NEGATIVE

## 2011-04-07 LAB — POCT RAPID STREP A: Streptococcus, Group A Screen (Direct): NEGATIVE

## 2011-04-14 ENCOUNTER — Telehealth: Payer: Self-pay | Admitting: Internal Medicine

## 2011-04-14 DIAGNOSIS — R1011 Right upper quadrant pain: Secondary | ICD-10-CM

## 2011-04-14 NOTE — Telephone Encounter (Signed)
Left message for patient to call back  

## 2011-04-14 NOTE — Telephone Encounter (Signed)
Patient is scheduled for [facility] 04/16/11 9:30.  She is instructed to be NPO after midnight and to arrive at 9:15

## 2011-04-14 NOTE — Telephone Encounter (Signed)
After eating fatty greasy meals pain in upper right quad, nausea and diarrhea.  Will lasts from 2-4 hours.  Dr Leone Payor please advise next step

## 2011-04-14 NOTE — Telephone Encounter (Signed)
Would do another [place] of abdomen - last in 2007 so could have gallstones now (lets make sure no other recent tests - last 1-2 yrs like this [place] or MR that might have shown gallstones - if so we can check them first)

## 2011-04-14 NOTE — Telephone Encounter (Signed)
She left me a message that she had post-prandial abdominal pain that she thinks is her gallbladder. Please call her (in admitting today) to find out more. Normal ultrasound and HIDA with EF in 2007. She likely needs an office visit and possibly more testing

## 2011-04-16 ENCOUNTER — Ambulatory Visit (HOSPITAL_COMMUNITY)
Admission: RE | Admit: 2011-04-16 | Discharge: 2011-04-16 | Disposition: A | Discharge status: Home / Self Care | Payer: 59 | Source: Ambulatory Visit | Attending: Internal Medicine | Admitting: Internal Medicine

## 2011-04-16 DIAGNOSIS — R1011 Right upper quadrant pain: Secondary | ICD-10-CM | POA: Insufficient documentation

## 2011-04-16 NOTE — Progress Notes (Signed)
Quick Note:  This is ok - again She needs to arrange an REV ______

## 2011-04-22 ENCOUNTER — Telehealth: Payer: Self-pay | Admitting: Diagnostic Radiology

## 2011-04-23 ENCOUNTER — Encounter: Payer: Self-pay | Admitting: Internal Medicine

## 2011-04-23 ENCOUNTER — Ambulatory Visit: Payer: Commercial Managed Care - PPO | Admitting: Internal Medicine

## 2011-04-23 ENCOUNTER — Ambulatory Visit (INDEPENDENT_AMBULATORY_CARE_PROVIDER_SITE_OTHER): Payer: 59 | Admitting: Internal Medicine

## 2011-04-23 DIAGNOSIS — R42 Dizziness and giddiness: Secondary | ICD-10-CM

## 2011-04-23 DIAGNOSIS — F411 Generalized anxiety disorder: Secondary | ICD-10-CM

## 2011-04-23 DIAGNOSIS — F329 Major depressive disorder, single episode, unspecified: Secondary | ICD-10-CM

## 2011-04-23 NOTE — Progress Notes (Signed)
  Subjective:    Patient ID: [name], female    DOB: [birth year], 55 y.o.   MRN: [id number]  HPI  55 year old patient who has a history of anxiety or depression who has done poorly over the past 6 weeks at that time she developed some worsening headaches and fatigue. She was seen by rheumatology due to her fear of a flareup of lupus and rheumatology felt that this was in remission. Laboratory studies were done at that time. She has some GI distress and was seen by Dr. Leone Payor evaluation included an abdominal ultrasound her GI symptoms also have settled down. More recently she has had some positional vertigo. This has caused her to forefoot a race and she has been somewhat despondent about this she continues to have mild vertigo. She states that she takes Effexor only 3 times per week. She does have prescriptions for Klonopin Valium and Ativan.    Review of Systems  Constitutional: Positive for fatigue.  HENT: Negative for hearing loss, congestion, sore throat, rhinorrhea, dental problem, sinus pressure and tinnitus.   Eyes: Negative for pain, discharge and visual disturbance.  Respiratory: Negative for cough and shortness of breath.   Cardiovascular: Negative for chest pain, palpitations and leg swelling.  Gastrointestinal: Negative for nausea, vomiting, abdominal pain, diarrhea, constipation, blood in stool and abdominal distention.  Genitourinary: Negative for dysuria, urgency, frequency, hematuria, flank pain, vaginal bleeding, vaginal discharge, difficulty urinating, vaginal pain and pelvic pain.  Musculoskeletal: Negative for joint swelling, arthralgias and gait problem.  Skin: Negative for rash.  Neurological: Positive for light-headedness. Negative for dizziness, syncope, speech difficulty, weakness, numbness and headaches.  Hematological: Negative for adenopathy.  Psychiatric/Behavioral: Positive for dysphoric mood. Negative for behavioral problems and agitation. The patient is  nervous/anxious.        Objective:   Physical Exam  Constitutional: She is oriented to person, place, and time. She appears well-developed and well-nourished. No distress.  HENT:  Head: Normocephalic.  Right Ear: External ear normal.  Left Ear: External ear normal.  Mouth/Throat: Oropharynx is clear and moist. No oropharyngeal exudate.  Eyes: Conjunctivae are normal. Pupils are equal, round, and reactive to light.       No nystagmus. Tympanic membranes normal  Neurological: She is alert and oriented to person, place, and time.       Finger to nose testing normal  Psychiatric:        Appears slightly depressed          Assessment & Plan:     Anxiety depression. We'll increase her Effexor to a daily dose. She has been asked to followup with her PCP as needed or in 4 weeks Mild positional vertigo. She does have antihistamines at home will take daily

## 2011-04-23 NOTE — Patient Instructions (Signed)
Followup with Dr. Cato Mulligan in one month or as needed Increase Effexor to daily use  Use Zyrtec or Allegra once daily  Call or return to clinic prn if these symptoms worsen or fail to improve as anticipated.

## 2011-04-24 NOTE — Telephone Encounter (Signed)
Called pt due to question about steroids, questions answered and requested that pt call back if further questions come up.

## 2011-04-29 LAB — URINALYSIS, ROUTINE W REFLEX MICROSCOPIC
Bilirubin Urine: NEGATIVE
Glucose, UA: NEGATIVE
Hgb urine dipstick: NEGATIVE
Ketones, ur: NEGATIVE
Nitrite: NEGATIVE
Protein, ur: NEGATIVE
Specific Gravity, Urine: 1.02
Urobilinogen, UA: 0.2
pH: 6.5

## 2011-04-29 LAB — CBC
HCT: 37.7
Hemoglobin: 12.7
MCHC: 33.6
MCV: 91.8
Platelets: 181
RBC: 4.11
RDW: 13.9
WBC: 4.9

## 2011-04-29 LAB — PROTIME-INR
INR: 1
Prothrombin Time: 13

## 2011-04-29 LAB — APTT: aPTT: 29

## 2011-06-26 ENCOUNTER — Telehealth: Payer: Self-pay | Admitting: Internal Medicine

## 2011-06-26 DIAGNOSIS — K148 Other diseases of tongue: Secondary | ICD-10-CM

## 2011-06-26 NOTE — Telephone Encounter (Signed)
Have her make appt with ENT

## 2011-06-26 NOTE — Telephone Encounter (Signed)
Please advise 

## 2011-06-26 NOTE — Telephone Encounter (Signed)
Pulled from Triage vmail. Pt was at [facility] yesterday and [facility] told her she had lesions on the back of her tongue. Suggested she see ENT for this. Pt wants to know if that's where she should go, and can she get referral? Please call.

## 2011-06-27 ENCOUNTER — Other Ambulatory Visit: Payer: Self-pay | Admitting: Internal Medicine

## 2011-06-27 ENCOUNTER — Other Ambulatory Visit (INDEPENDENT_AMBULATORY_CARE_PROVIDER_SITE_OTHER): Payer: 59

## 2011-06-27 DIAGNOSIS — R300 Dysuria: Secondary | ICD-10-CM

## 2011-06-27 LAB — URINALYSIS
Bilirubin Urine: NEGATIVE
Hgb urine dipstick: NEGATIVE
Leukocytes, UA: NEGATIVE
Nitrite: NEGATIVE
Specific Gravity, Urine: >=1.03 (ref 1.000–1.030)
Total Protein, Urine: NEGATIVE
Urine Glucose: NEGATIVE
Urobilinogen, UA: 0.2 (ref 0.0–1.0)
pH: 5.5 (ref 5.0–8.0)

## 2011-06-27 NOTE — Telephone Encounter (Signed)
Arline Asp, will you please put a referral in for ENT on the pt? Thank you!!

## 2011-06-27 NOTE — Telephone Encounter (Signed)
Referral enetered

## 2011-06-29 LAB — URINE CULTURE: Colony Count: 15000

## 2011-07-12 ENCOUNTER — Emergency Department (INDEPENDENT_AMBULATORY_CARE_PROVIDER_SITE_OTHER)
Admission: EM | Admit: 2011-07-12 | Discharge: 2011-07-12 | Disposition: A | Discharge status: Home / Self Care | Payer: 59 | Source: Home / Self Care | Attending: Family Medicine | Admitting: Family Medicine

## 2011-07-12 DIAGNOSIS — J069 Acute upper respiratory infection, unspecified: Secondary | ICD-10-CM

## 2011-07-12 MED ORDER — CEFDINIR 300 MG PO CAPS: 300.0000 mg | ORAL_CAPSULE | Freq: Two times a day (BID) | ORAL | Status: AC

## 2011-07-12 MED ORDER — HYDROCOD POLST-CHLORPHEN POLST 10-8 MG/5ML PO LQCR: 5.0000 mL | Freq: Every evening | ORAL | Status: DC | PRN

## 2011-07-12 NOTE — ED Notes (Signed)
States mother visited and had bronchitis, symptoms started two days ago.  increase cough which is making it difficult to sleep

## 2011-07-12 NOTE — ED Provider Notes (Signed)
History     CSN: [id number]  Arrival date & time 07/12/11  1346   First MD Initiated Contact with Patient 07/12/11 1606      Chief Complaint  Patient presents with  . Nasal Congestion  . Headache      HPI Comments: Patient complains of approximately 2 day history of gradually progressive URI symptoms beginning with a mild sore throat (now improved), followed by progressive nasal congestion.  A cough started next.  Complains of fatigue and initial myalgias.  Cough is now worse at night and generally non-productive during the day.  There has been no pleuritic pain, shortness of breath, or wheezes.   The history is provided by the patient.    Past Medical History  Diagnosis Date  . Depression   . GERD (gastroesophageal reflux disease)   . Anxiety   . Arthritis   . IBS (irritable bowel syndrome)     constipation predominant  . Interstitial cystitis   . Lupus   . Hemorrhoids     Past Surgical History  Procedure Date  . Abdominal hysterectomy 2007  . Tonsillectomy   . Excision morton's neuroma   . Colonoscopy 12/24/04    internal and external hemorrhoids  . Flexible sigmoidoscopy 03/07/08    internal and external hemorrhoids  . Upper gastrointestinal endoscopy 10/23/10    gastritis, irregular Z-line    Family History  Problem Relation Age of Onset  . Coronary artery disease Father   . Diabetes      uncle  . Diverticulosis Mother   . Hypertension Mother   . Osteoarthritis Mother     History  Substance Use Topics  . Smoking status: Never Smoker   . Smokeless tobacco: Never Used  . Alcohol Use: No    OB History    Grav Para Term Preterm Abortions TAB SAB Ect Mult Living                  Review of Systems + sore throat + cough No pleuritic pain No wheezing + nasal congestion No post-nasal drainage No sinus pain/pressure No itchy/red eyes No earache No hemoptysis No SOB No fever/chills No nausea No vomiting No abdominal pain No diarrhea No  urinary symptoms No skin rashes + fatigue No myalgias No headache Used OTC meds without relief (Nyquil, etc.) Allergies  Doxycycline; Morphine sulfate; Penicillins; and Promethazine hcl  Home Medications   Current Outpatient Rx  Name Route Sig Dispense Refill  . BISACODYL 5 MG PO TBEC Oral Take 5 mg by mouth daily as needed. Take 1-4 tablets by mouth daily as needed     . CALCIUM CARBONATE 600 MG PO TABS Oral Take 600 mg by mouth 2 (two) times daily with a meal.      . CEFDINIR 300 MG PO CAPS Oral Take 1 capsule (300 mg total) by mouth 2 (two) times daily. (Rx void after 07/21/11) 20 capsule 0  . HYDROCOD POLST-CHLORPHEN POLST 10-8 MG/5ML PO LQCR Oral Take 5 mLs by mouth at bedtime as needed. 115 mL 0  . VITAMIN D3 2000 UNITS PO TABS Oral Take 1 capsule by mouth as directed.      Marland Kitchen CLONAZEPAM 1 MG PO TABS Oral Take 1 mg by mouth at bedtime as needed.      . CYCLOBENZAPRINE HCL 10 MG PO TABS Oral Take 10 mg by mouth daily as needed.      Marland Kitchen DIAZEPAM 2 MG PO TABS  1 tab 1/2 hour prior to self cath  Per Dr Logan Bores, Urologist     . DPH-LIDO-ALHYDR-MGHYDR-SIMETH MT SUSP  RINSE AND SWALLOW 15-30 ML'S AS NEEDED 480 mL 0  . ESOMEPRAZOLE MAGNESIUM 40 MG PO CPDR Oral Take 1 capsule (40 mg total) by mouth daily before breakfast. 30 capsule 11  . ESTRADIOL 1 MG PO TABS Oral Take 1 mg by mouth daily.      Marland Kitchen HEPARIN SODIUM (PORCINE) 08657 UNIT/ML IJ SOLN Bladder Irrigation 10 Units/kg by Bladder Irrigation route once. Per Dr Logan Bores, Urologist     . HYDROXYCHLOROQUINE SULFATE 200 MG PO TABS Oral Take by mouth daily.      Marland Kitchen LORAZEPAM 1 MG PO TABS Oral Take 1 mg by mouth every 8 (eight) hours as needed.      Marland Kitchen MAGNESIUM GLUCONATE 500 MG PO TABS Oral Take 500 mg by mouth daily.      Marland Kitchen METHOTREXATE CHEMO INJECTION 1 GM/40ML  Inject 0.4 mg weekly     . MULTIVITAMIN PO Oral Take 1 tablet by mouth daily.      . SUCRALFATE 1 GM/10ML PO SUSP Oral Take 10 mLs (1 g total) by mouth 2 (two) times daily. 480 mL 1  .  VENLAFAXINE HCL 75 MG PO TABS Oral Take 75 mg by mouth 2 (two) times daily.      Marland Kitchen ZOLPIDEM TARTRATE 10 MG PO TABS  TAKE 1 TABLET BY MOUTH EVERY NIGHT AT BEDTIME AS NEEDED FOR SLEEP 20 tablet 2  . ZOMIG 5 MG PO TABS  ONE TABLET ORALLY AT THE TIME OF HEADACHE. MAY REPEAT X1 IN ONE HOUR IF HEADACHE PERSISTS. 9 tablet 3    BP 140/84  Pulse 81  Temp(Src) 98 F (36.7 C) (Oral)  Resp 24  Ht 5\' 3"  (1.6 m)  Wt 116 lb 8 oz (52.844 kg)  BMI 20.64 kg/m2  SpO2 100%  Physical Exam Nursing notes and Vital Signs reviewed. Appearance:  Patient appears healthy, stated age, and in no acute distress Eyes:  Pupils are equal, round, and reactive to light and accomodation.  Extraocular movement is intact.  Conjunctivae are not inflamed  Ears:  Canals normal.  Tympanic membranes normal.  Nose:  Mildly congested turbinates.  No sinus tenderness.    Pharynx:  Normal Neck:  Supple.  Slightly tender shotty posterior nodes are palpated bilaterally  Lungs:  Clear to auscultation.  Breath sounds are equal.  Heart:  Regular rate and rhythm without murmurs, rubs, or gallops.  Abdomen:  Nontender without masses or hepatosplenomegaly.  Bowel sounds are present.  No CVA or flank tenderness.  Extremities:  No edema.  No calf tenderness Skin:  No rash present.   ED Course  Procedures  none      1. Acute upper respiratory infections of unspecified site       MDM  There is no evidence of bacterial infection today.   Treat symptomatically for now:  Begin Tussionex at bedtime. Take Mucinex (guaifenesin) twice daily for congestion.  Increase fluid intake, rest. May use Afrin nasal spray (or generic oxymetazoline) twice daily for about 5 days.  Also recommend using saline nasal spray several times daily and/or saline nasal irrigation. Stop all antihistamines for now, and other non-prescription cough/cold preparations. Begin Omnicef if not improving about 5 days or if persistent fever develops. Follow-up with  family doctor if not improving about one week.         Donna Christen, MD 07/14/11 [phone]

## 2011-07-15 LAB — HM PAP SMEAR

## 2011-07-21 ENCOUNTER — Other Ambulatory Visit: Payer: Self-pay | Admitting: Obstetrics & Gynecology

## 2011-07-21 DIAGNOSIS — Z1231 Encounter for screening mammogram for malignant neoplasm of breast: Secondary | ICD-10-CM

## 2011-08-04 ENCOUNTER — Telehealth: Payer: Self-pay | Admitting: *Deleted

## 2011-08-04 DIAGNOSIS — K148 Other diseases of tongue: Secondary | ICD-10-CM

## 2011-08-04 NOTE — Telephone Encounter (Addendum)
Pt  saw an ENT for lesions on her tongue, and he only took about 2 minutes and told her it was ok.  She wants a second opinion as they are getting bigger.  Requesting to speak to Dr. Marliss Coots nurse.

## 2011-08-04 NOTE — Telephone Encounter (Signed)
Ok to refer to [facility] for opinion

## 2011-08-20 ENCOUNTER — Ambulatory Visit
Admission: RE | Admit: 2011-08-20 | Discharge: 2011-08-20 | Disposition: A | Discharge status: Home / Self Care | Payer: 59 | Source: Ambulatory Visit | Attending: Obstetrics & Gynecology | Admitting: Obstetrics & Gynecology

## 2011-08-20 DIAGNOSIS — Z1231 Encounter for screening mammogram for malignant neoplasm of breast: Secondary | ICD-10-CM

## 2011-08-21 ENCOUNTER — Other Ambulatory Visit: Payer: Self-pay | Admitting: Obstetrics & Gynecology

## 2011-08-21 DIAGNOSIS — N6459 Other signs and symptoms in breast: Secondary | ICD-10-CM

## 2011-09-03 ENCOUNTER — Ambulatory Visit
Admission: RE | Admit: 2011-09-03 | Discharge: 2011-09-03 | Disposition: A | Discharge status: Home / Self Care | Payer: 59 | Source: Ambulatory Visit | Attending: Obstetrics & Gynecology | Admitting: Obstetrics & Gynecology

## 2011-09-03 ENCOUNTER — Other Ambulatory Visit: Payer: Self-pay | Admitting: Obstetrics & Gynecology

## 2011-09-03 DIAGNOSIS — N6459 Other signs and symptoms in breast: Secondary | ICD-10-CM

## 2011-09-17 ENCOUNTER — Other Ambulatory Visit (INDEPENDENT_AMBULATORY_CARE_PROVIDER_SITE_OTHER): Payer: 59

## 2011-09-17 ENCOUNTER — Ambulatory Visit (INDEPENDENT_AMBULATORY_CARE_PROVIDER_SITE_OTHER): Payer: 59 | Admitting: Physician Assistant

## 2011-09-17 ENCOUNTER — Other Ambulatory Visit: Payer: 59

## 2011-09-17 ENCOUNTER — Encounter: Payer: Self-pay | Admitting: Physician Assistant

## 2011-09-17 DIAGNOSIS — K2990 Gastroduodenitis, unspecified, without bleeding: Secondary | ICD-10-CM

## 2011-09-17 DIAGNOSIS — R109 Unspecified abdominal pain: Secondary | ICD-10-CM

## 2011-09-17 DIAGNOSIS — R197 Diarrhea, unspecified: Secondary | ICD-10-CM

## 2011-09-17 DIAGNOSIS — M329 Systemic lupus erythematosus, unspecified: Secondary | ICD-10-CM

## 2011-09-17 DIAGNOSIS — K2970 Gastritis, unspecified, without bleeding: Secondary | ICD-10-CM

## 2011-09-17 LAB — CBC WITH DIFFERENTIAL/PLATELET
Basophils Absolute: 0 10*3/uL (ref 0.0–0.1)
Basophils Relative: 0.6 % (ref 0.0–3.0)
Eosinophils Absolute: 0.2 10*3/uL (ref 0.0–0.7)
Eosinophils Relative: 5 % (ref 0.0–5.0)
HCT: 41.7 % (ref 36.0–46.0)
Hemoglobin: 13.8 g/dL (ref 12.0–15.0)
Lymphocytes Relative: 21.5 % (ref 12.0–46.0)
Lymphs Abs: 0.9 10*3/uL (ref 0.7–4.0)
MCHC: 33.1 g/dL (ref 30.0–36.0)
MCV: 97.6 fl (ref 78.0–100.0)
Monocytes Absolute: 0.4 10*3/uL (ref 0.1–1.0)
Monocytes Relative: 8.9 % (ref 3.0–12.0)
Neutro Abs: 2.7 10*3/uL (ref 1.4–7.7)
Neutrophils Relative %: 64 % (ref 43.0–77.0)
Platelets: 199 10*3/uL (ref 150.0–400.0)
RBC: 4.27 Mil/uL (ref 3.87–5.11)
RDW: 15.1 % — ABNORMAL HIGH (ref 11.5–14.6)
WBC: 4.2 10*3/uL — ABNORMAL LOW (ref 4.5–10.5)

## 2011-09-17 LAB — BASIC METABOLIC PANEL
BUN: 9 mg/dL (ref 6–23)
CO2: 29 mEq/L (ref 19–32)
Calcium: 9.3 mg/dL (ref 8.4–10.5)
Chloride: 102 mEq/L (ref 96–112)
Creatinine, Ser: 0.7 mg/dL (ref 0.4–1.2)
GFR: 92.3 mL/min (ref >60.00)
Glucose, Bld: 83 mg/dL (ref 70–99)
Potassium: 4.6 mEq/L (ref 3.5–5.1)
Sodium: 139 mEq/L (ref 135–145)

## 2011-09-17 MED ORDER — SACCHAROMYCES BOULARDII 250 MG PO CAPS: 250.0000 mg | ORAL_CAPSULE | Freq: Two times a day (BID) | ORAL | Status: AC

## 2011-09-17 MED ORDER — METRONIDAZOLE 250 MG PO TABS: ORAL_TABLET | ORAL | Status: DC

## 2011-09-17 MED ORDER — HYOSCYAMINE SULFATE 0.125 MG SL SUBL: 0.1250 mg | SUBLINGUAL_TABLET | SUBLINGUAL | Status: DC | PRN

## 2011-09-17 NOTE — Patient Instructions (Signed)
Please go to the basement level to have your labs drawn.   We sent prescriptions to [facility], Flagyl and Levsin. I faxed the Florastor probiotic. We have given you a note for work.

## 2011-09-17 NOTE — Progress Notes (Signed)
Subjective:    Patient ID: [name], female    DOB: May 24, 1956, 56 y.o.   MRN: [id number]  HPI [name] is a pleasant 56 year old white female known to Dr. Leone Payor. She has history of lupus and is currently maintained on methotrexate. She also has history of GERD and IBS as well as interstitial cystitis.  She had a screening colonoscopy done in 2006 which was negative with the exception of hemorrhoids and had an upper endoscopy done in April of 2012 per Dr. [name] with finding of a medication-induced gastritis.  She comes in today as an urgent work in with complaints of a one-week history of diarrhea which she says has been explosive watery and very malodorous. Most days  she's having 4-6 bowel movements per day and says there are large volume and associated with crampy abdominal pain starts in her upper abdomen and works it's way down. She has had some associated low-grade fevers in the 99-100 range as well as chills and nausea, without vomiting. She says she feels very tired and generally ill. 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 On review of her records she did have an upper respiratory infection and at the beginning of January for which she took a course of [place]. She has not had any antibiotics since that time.    Review of Systems  Constitutional: Positive for fever, chills, appetite change and fatigue.  HENT: Negative.   Eyes: Negative.   Respiratory: Negative.   Cardiovascular: Negative.   Gastrointestinal: Positive for nausea, abdominal pain and diarrhea.  Genitourinary: Negative.   Musculoskeletal: Negative.   Neurological: Positive for weakness.  Hematological: Negative.   Psychiatric/Behavioral: Negative.    Outpatient Prescriptions Prior to Visit  Medication Sig Dispense Refill  . bisacodyl (BISACODYL) 5 MG EC tablet Take 5 mg by mouth daily as needed. Take 1-4 tablets by  mouth daily as needed       . calcium carbonate (OS-CAL) 600 MG TABS Take 600 mg by mouth 2 (two) times daily with a meal.        . chlorpheniramine-HYDROcodone (TUSSIONEX PENNKINETIC ER) 10-8 MG/5ML LQCR Take 5 mLs by mouth at bedtime as needed.  115 mL  0  . Cholecalciferol (VITAMIN D3) 2000 UNITS TABS Take 1 capsule by mouth as directed.        . clonazePAM (KLONOPIN) 1 MG tablet Take 1 mg by mouth at bedtime as needed.        . diazepam (VALIUM) 2 MG tablet 5 mg. 1 tab 1/2 hour prior to self cath Per Dr Logan Bores, Urologist      . DPH-Lido-AlHydr-MgHydr-Simeth (FIRST-MOUTHWASH BLM) SUSP RINSE AND SWALLOW 15-30 ML'S AS NEEDED  480 mL  0  . esomeprazole (NEXIUM) 40 MG capsule Take 1 capsule (40 mg total) by mouth daily before breakfast.  30 capsule  11  . estradiol (ESTRACE) 1 MG tablet Take 1 mg by mouth daily.        . heparin 40981 UNIT/ML injection 10 Units/kg by Bladder Irrigation route once. Per Dr Logan Bores, Urologist       . hydroxychloroquine (PLAQUENIL) 200 MG tablet Take by mouth daily.        . magnesium gluconate (MAGONATE) 500 MG tablet Take 500 mg by mouth daily.        . methotrexate 1 GM/40ML SOLN injection Inject 0.4 mg weekly       .  Multiple Vitamin (MULTIVITAMIN PO) Take 1 tablet by mouth daily.        . sucralfate (CARAFATE) 1 GM/10ML suspension Take 10 mLs (1 g total) by mouth 2 (two) times daily.  480 mL  1  . venlafaxine (EFFEXOR) 75 MG tablet Take 75 mg by mouth 2 (two) times daily.        Marland Kitchen zolpidem (AMBIEN) 10 MG tablet TAKE 1 TABLET BY MOUTH EVERY NIGHT AT BEDTIME AS NEEDED FOR SLEEP  20 tablet  2  . ZOMIG 5 MG tablet ONE TABLET ORALLY AT THE TIME OF HEADACHE. MAY REPEAT X1 IN ONE HOUR IF HEADACHE PERSISTS.  9 tablet  3  . cyclobenzaprine (FLEXERIL) 10 MG tablet Take 10 mg by mouth daily as needed.        Marland Kitchen LORazepam (ATIVAN) 1 MG tablet Take 1 mg by mouth every 8 (eight) hours as needed.         Allergies  Allergen Reactions  . Doxycycline     REACTION: Rash  .  Morphine Sulfate     REACTION: nausea and vomiting  . Penicillins   . Promethazine Hcl    Patient Active Problem List  Diagnoses  . UNSPECIFIED VITAMIN D DEFICIENCY  . ANXIETY  . DEPRESSION  . HEMORRHOIDS  . GERD  . IRRITABLE BOWEL SYNDROME  . INTERSTITIAL CYSTITIS  . UTI  . CYST, OVARIAN NEC/NOS  . HYPERTENSION, GESTATIONAL  . HAND PAIN  . Gastritis  . SLE (systemic lupus erythematosus)        Objective:   Physical Exam well-developed white female in no acute distress, achieved appearing 16/74 pulse 64. HEENT; not normocephalic, EOMI ,PERRLA, sclera anicteric,Neck; Supple no JVD, Cardiovascular; regular rate and rhythm with S1-S2 no murmur gallop, Pulmonary; clear bilaterally, Abdomen; has mild generalized tenderness which is nonfocal no palpable mass or hepatosplenomegaly bowel sounds are hyperactive no guarding or rebound, Rectal; not done, Extremities; no clubbing, cyanosis, or edema skin warm and dry. Psych; mood and affect normal and appropriate.        Assessment & Plan:  #62 56 year old female with acute diarrheal illness x1 week with associated abdominal pain cramping fatigue low-grade fever and nausea. Will need to rule out C. difficile colitis, other possibilities include other viral or bacterial gastroenteritis. She is immunosuppressed. #2 systemic lupus erythematosus #3 history of IBS #4 GERD #5 interstitial cystitis  Plan; Will check a CBC and BMET today Stool culture ,stool for C. difficile PCR, and stool for lactoferrin Start empiric Flagyl 250 mg by mouth 4 times daily x14 days Start florastor  one by mouth twice daily x14 days Patient advised to push fluids and a soft low roughage diet Note for patient to remain out of work for the remainder of the week Further plans pending results of cultures

## 2011-09-17 NOTE — Progress Notes (Signed)
Reviewed and agree with management. Halton Neas T. Jeorge Reister MD FACG 

## 2011-09-18 LAB — FECAL LACTOFERRIN, QUANT: Lactoferrin: POSITIVE

## 2011-09-19 LAB — CLOSTRIDIUM DIFFICILE BY PCR: Toxigenic C. Difficile by PCR: NOT DETECTED

## 2011-09-21 LAB — STOOL CULTURE

## 2011-09-22 ENCOUNTER — Telehealth: Payer: Self-pay | Admitting: Physician Assistant

## 2011-09-22 MED ORDER — SUCRALFATE 1 GM/10ML PO SUSP: 1.0000 g | Freq: Three times a day (TID) | ORAL | Status: DC

## 2011-09-30 ENCOUNTER — Other Ambulatory Visit (INDEPENDENT_AMBULATORY_CARE_PROVIDER_SITE_OTHER): Payer: 59

## 2011-09-30 ENCOUNTER — Ambulatory Visit (INDEPENDENT_AMBULATORY_CARE_PROVIDER_SITE_OTHER): Payer: 59 | Admitting: Internal Medicine

## 2011-09-30 ENCOUNTER — Encounter: Payer: Self-pay | Admitting: Internal Medicine

## 2011-09-30 ENCOUNTER — Telehealth: Payer: Self-pay | Admitting: Internal Medicine

## 2011-09-30 VITALS — BP 118/64 | HR 72 | Ht 63.0 in | Wt 115.0 lb

## 2011-09-30 DIAGNOSIS — R1013 Epigastric pain: Secondary | ICD-10-CM

## 2011-09-30 MED ORDER — DICYCLOMINE HCL 20 MG PO TABS: ORAL_TABLET | ORAL | Status: DC

## 2011-09-30 NOTE — Telephone Encounter (Signed)
Pt called and said that she used to get clonazePAM (KLONOPIN) 1 MG tablet through Psychologist via Dr Stormy Fabian office to help pt sleep. Pt no longer sees therapist, and is req Dr Cato Mulligan to continue witting script. Pls advise. Pt uses [facility].

## 2011-09-30 NOTE — Patient Instructions (Signed)
Your physician has requested that you go to the basement for the following lab work before leaving today: Amylase, Lipase We have sent the following medications to your pharmacy for you to pick up at your convenience: Bentyl

## 2011-09-30 NOTE — Progress Notes (Signed)
  Subjective:    Patient ID: [name], female    DOB: 01-25-1956, 56 y.o.   MRN: [id number]  HPI The patient presents because of upper abdominal pain. She was recently seen by our physician assistant with a diarrheal illness that improved with metronidazole. Cultures and stool studies were unremarkable though fecal lactoferrin was positive. She is reporting persistent intermittent epigastric pain. Sometimes severe, and awakened or the other night and that bothered her. She generally has insomnia, and she was able to go to sleep and this pain awakened her. It is not associated with any particular food, movement or stress. She says she is not under any particular stress at this time. She has been on methotrexate for some time, since last year for a diagnosis of lupus with mostly arthritic manifestations. She seems to be responding to that with respect to the arthritis. She has not had any GI intolerance known for that. She does have some nausea at times.  In the past she has had similar that identical upper abdominal pain and physical workup with HIDA scan, ultrasound and has been unrevealing. She reports that Levsin did not help this upper pain though she is used that for lower, pain with relief. She has not vomited. Her GI review of systems appears otherwise unremarkable, she is returning back more towards her normal constipation predominant IBS mode after clearing the diarrhea recently.  Medications, allergies, past medical and surgical history reviewed and updated in the chart.  Review of Systems As above She notes that she has run out of Klonopin which was helping her sleep. She is not sure who prescribed that.    Objective:   Physical Exam General:  NAD Eyes:   anicteric Lungs:  clear Heart:  S1S2 no rubs, murmurs or gallops Abdomen:  soft and nontender, BS+ Ext:   no edema    Data Reviewed:  Lab Results  Component Value Date   WBC 4.2* 09/17/2011   HGB 13.8 09/17/2011   HCT 41.7  09/17/2011   MCV 97.6 09/17/2011   PLT 199.0 09/17/2011            Assessment & Plan:   1. Epigastric pain    Cause of this is not clear though I highly suspect is a functional problem. She's had recurrent abdominal pain in many locations. I've explained this to her and she seems to understand that. I'm going to check an amylase and lipase. Dicyclomine 20 mg every 6 hours as needed is prescribed to see if that works better than the Levsin she had. Further plans pending the lab results and response or lack of the dicyclomine. Question if this could be a side effect from methotrexate though probably not.  I will send a copy to Dr. Dareen Piano, her rheumatologist.

## 2011-10-01 LAB — LIPASE: Lipase: 44 U/L (ref 11.0–59.0)

## 2011-10-01 LAB — AMYLASE: Amylase: 65 U/L (ref 27–131)

## 2011-10-01 NOTE — Telephone Encounter (Signed)
Pt needs OV 

## 2011-10-01 NOTE — Progress Notes (Signed)
Quick Note:  Labs normal Let's see how the dicyclomine does ______

## 2011-10-01 NOTE — Telephone Encounter (Signed)
Pt is aware they need to schedule appt said they will call back to schedule

## 2011-10-22 ENCOUNTER — Ambulatory Visit: Payer: 59 | Admitting: Internal Medicine

## 2011-10-30 ENCOUNTER — Encounter: Payer: Self-pay | Admitting: Internal Medicine

## 2011-10-30 ENCOUNTER — Ambulatory Visit (INDEPENDENT_AMBULATORY_CARE_PROVIDER_SITE_OTHER): Payer: 59 | Admitting: Internal Medicine

## 2011-10-30 VITALS — BP 120/80 | HR 72 | Temp 98.4°F | Ht 63.0 in | Wt 119.0 lb

## 2011-10-30 DIAGNOSIS — F329 Major depressive disorder, single episode, unspecified: Secondary | ICD-10-CM

## 2011-10-30 DIAGNOSIS — F3289 Other specified depressive episodes: Secondary | ICD-10-CM

## 2011-10-30 MED ORDER — CLONAZEPAM 1 MG PO TABS: 0.5000 mg | ORAL_TABLET | Freq: Every evening | ORAL | Status: DC | PRN

## 2011-10-30 MED ORDER — VENLAFAXINE HCL 75 MG PO TABS: 75.0000 mg | ORAL_TABLET | Freq: Two times a day (BID) | ORAL | Status: DC

## 2011-10-30 NOTE — Assessment & Plan Note (Signed)
Doing well. Continue current meds 

## 2011-10-30 NOTE — Progress Notes (Signed)
Patient ID: [name], female   DOB: 1956-04-25, 56 y.o.   MRN: [id number] Mood disorder---tolerating meds Insomnia--uses klonopin SLE---followed by rheumatology  Past Medical History  Diagnosis Date  . Depression   . GERD (gastroesophageal reflux disease)   . Anxiety   . Arthritis     Rheumatoid  . IBS (irritable bowel syndrome)     constipation predominant  . Interstitial cystitis   . Lupus   . Hemorrhoids   . Raynaud disease     History   Social History  . Marital Status: Married    Spouse Name: N/A    Number of Children: N/A  . Years of Education: N/A   Occupational History  . RN [employer]   Social History Main Topics  . Smoking status: Never Smoker   . Smokeless tobacco: Never Used  . Alcohol Use: No  . Drug Use: No  . Sexually Active: Not on file   Other Topics Concern  . Not on file   Social History Narrative  . No narrative on file    Past Surgical History  Procedure Date  . Abdominal hysterectomy 2007  . Tonsillectomy   . Excision morton's neuroma   . Colonoscopy 12/24/04    internal and external hemorrhoids  . Flexible sigmoidoscopy 03/07/08    internal and external hemorrhoids  . Upper gastrointestinal endoscopy 10/23/10    gastritis, irregular Z-line    Family History  Problem Relation Age of Onset  . Coronary artery disease Father   . Diabetes      uncle  . Diverticulosis Mother   . Hypertension Mother   . Osteoarthritis Mother   . Colon cancer Neg Hx     Allergies  Allergen Reactions  . Doxycycline     REACTION: Rash  . Morphine Sulfate     REACTION: nausea and vomiting  . Penicillins   . Promethazine Hcl     Current Outpatient Prescriptions on File Prior to Visit  Medication Sig Dispense Refill  . bisacodyl (BISACODYL) 5 MG EC tablet Take 5 mg by mouth daily as needed. Take 1-4 tablets by mouth daily as needed       . calcium carbonate (OS-CAL) 600 MG TABS Take 600 mg by mouth 2 (two) times daily with a meal.          . Cholecalciferol (VITAMIN D3) 2000 UNITS TABS Take 1 capsule by mouth as directed.        . dicyclomine (BENTYL) 20 MG tablet Take one tablet every 6 hrs as needed for pain  60 tablet  0  . DPH-Lido-AlHydr-MgHydr-Simeth (FIRST-MOUTHWASH BLM) SUSP RINSE AND SWALLOW 15-30 ML'S AS NEEDED  480 mL  0  . esomeprazole (NEXIUM) 40 MG capsule Take 1 capsule (40 mg total) by mouth daily before breakfast.  30 capsule  11  . estradiol (ESTRACE) 1 MG tablet Take 1 mg by mouth daily.        . heparin 40981 UNIT/ML injection 10 Units/kg by Bladder Irrigation route once. Per Dr Logan Bores, Urologist       . methotrexate 1 GM/40ML SOLN injection Inject 0.4 mg weekly       . Multiple Vitamin (MULTIVITAMIN PO) Take 1 tablet by mouth daily.        . sucralfate (CARAFATE) 1 GM/10ML suspension Take 10 mLs (1 g total) by mouth 3 (three) times daily between meals.  480 mL  1  . zolpidem (AMBIEN) 10 MG tablet TAKE 1 TABLET BY MOUTH EVERY NIGHT AT  BEDTIME AS NEEDED FOR SLEEP  20 tablet  2  . ZOMIG 5 MG tablet ONE TABLET ORALLY AT THE TIME OF HEADACHE. MAY REPEAT X1 IN ONE HOUR IF HEADACHE PERSISTS.  9 tablet  3  . DISCONTD: clonazePAM (KLONOPIN) 1 MG tablet Take 1 mg by mouth at bedtime as needed.        Marland Kitchen DISCONTD: hyoscyamine (LEVSIN/SL) 0.125 MG SL tablet Place 1 tablet (0.125 mg total) under the tongue every 6 (six) hours as needed for cramping.  30 tablet  3  . DISCONTD: venlafaxine (EFFEXOR) 75 MG tablet Take 75 mg by mouth 2 (two) times daily.        Marland Kitchen DISCONTD: hyoscyamine (LEVSIN/SL) 0.125 MG SL tablet Place 1 tablet (0.125 mg total) under the tongue every 4 (four) hours as needed for cramping.  30 tablet  0     patient denies chest pain, shortness of breath, orthopnea. Denies lower extremity edema, abdominal pain, change in appetite, change in bowel movements. Patient denies rashes, musculoskeletal complaints. No other specific complaints in a complete review of systems.   BP 120/80  Pulse 72  Temp(Src) 98.4 F  (36.9 C) (Oral)  Ht 5\' 3"  (1.6 m)  Wt 119 lb (53.978 kg)  BMI 21.08 kg/m2  Well-developed well-nourished female in no acute distress. HEENT exam atraumatic, normocephalic, extraocular muscles are intact. Neck is supple. No jugular venous distention no thyromegaly. Chest clear to auscultation without increased work of breathing. Cardiac exam S1 and S2 are regular. Abdominal exam active bowel sounds, soft, nontender. Extremities no edema.

## 2011-11-25 ENCOUNTER — Telehealth: Payer: Self-pay | Admitting: *Deleted

## 2011-11-25 NOTE — Telephone Encounter (Signed)
This is a [employer] employee-has sinusitis with temp 102. I scheduled her with padonda tomorrow,but she is trying to work and I didn't know if you wanted to send her something in or not

## 2011-11-26 ENCOUNTER — Ambulatory Visit (INDEPENDENT_AMBULATORY_CARE_PROVIDER_SITE_OTHER): Payer: 59 | Admitting: Family

## 2011-11-26 ENCOUNTER — Encounter: Payer: Self-pay | Admitting: Family

## 2011-11-26 VITALS — BP 100/68 | HR 103 | Temp 98.9°F | Wt 117.0 lb

## 2011-11-26 DIAGNOSIS — J209 Acute bronchitis, unspecified: Secondary | ICD-10-CM

## 2011-11-26 DIAGNOSIS — R059 Cough, unspecified: Secondary | ICD-10-CM

## 2011-11-26 DIAGNOSIS — R05 Cough: Secondary | ICD-10-CM

## 2011-11-26 MED ORDER — HYDROCOD POLST-CHLORPHEN POLST 10-8 MG/5ML PO LQCR: 5.0000 mL | Freq: Two times a day (BID) | ORAL | Status: DC | PRN

## 2011-11-26 MED ORDER — AZITHROMYCIN 250 MG PO TABS: ORAL_TABLET | ORAL | Status: AC

## 2011-11-26 NOTE — Patient Instructions (Signed)
Upper Respiratory Infection, Adult An upper respiratory infection (URI) is also sometimes known as the common cold. The upper respiratory tract includes the nose, sinuses, throat, trachea, and bronchi. Bronchi are the airways leading to the lungs. Most people improve within 1 week, but symptoms can last up to 2 weeks. A residual cough may last even longer.  CAUSES Many different viruses can infect the tissues lining the upper respiratory tract. The tissues become irritated and inflamed and often become very moist. Mucus production is also common. A cold is contagious. You can easily spread the virus to others by oral contact. This includes kissing, sharing a glass, coughing, or sneezing. Touching your mouth or nose and then touching a surface, which is then touched by another person, can also spread the virus. SYMPTOMS  Symptoms typically develop 1 to 3 days after you come in contact with a cold virus. Symptoms vary from person to person. They may include:  Runny nose.   Sneezing.   Nasal congestion.   Sinus irritation.   Sore throat.   Loss of voice (laryngitis).   Cough.   Fatigue.   Muscle aches.   Loss of appetite.   Headache.   Low-grade fever.  DIAGNOSIS  You might diagnose your own cold based on familiar symptoms, since most people get a cold 2 to 3 times a year. Your caregiver can confirm this based on your exam. Most importantly, your caregiver can check that your symptoms are not due to another disease such as strep throat, sinusitis, pneumonia, asthma, or epiglottitis. Blood tests, throat tests, and X-rays are not necessary to diagnose a common cold, but they may sometimes be helpful in excluding other more serious diseases. Your caregiver will decide if any further tests are required. RISKS AND COMPLICATIONS  You may be at risk for a more severe case of the common cold if you smoke cigarettes, have chronic heart disease (such as heart failure) or lung disease (such as  asthma), or if you have a weakened immune system. The very young and very old are also at risk for more serious infections. Bacterial sinusitis, middle ear infections, and bacterial pneumonia can complicate the common cold. The common cold can worsen asthma and chronic obstructive pulmonary disease (COPD). Sometimes, these complications can require emergency medical care and may be life-threatening. PREVENTION  The best way to protect against getting a cold is to practice good hygiene. Avoid oral or hand contact with people with cold symptoms. Wash your hands often if contact occurs. There is no clear evidence that vitamin C, vitamin E, echinacea, or exercise reduces the chance of developing a cold. However, it is always recommended to get plenty of rest and practice good nutrition. TREATMENT  Treatment is directed at relieving symptoms. There is no cure. Antibiotics are not effective, because the infection is caused by a virus, not by bacteria. Treatment may include:  Increased fluid intake. Sports drinks offer valuable electrolytes, sugars, and fluids.   Breathing heated mist or steam (vaporizer or shower).   Eating chicken soup or other clear broths, and maintaining good nutrition.   Getting plenty of rest.   Using gargles or lozenges for comfort.   Controlling fevers with ibuprofen or acetaminophen as directed by your caregiver.   Increasing usage of your inhaler if you have asthma.  Zinc gel and zinc lozenges, taken in the first 24 hours of the common cold, can shorten the duration and lessen the severity of symptoms. Pain medicines may help with fever, muscle   aches, and throat pain. A variety of non-prescription medicines are available to treat congestion and runny nose. Your caregiver can make recommendations and may suggest nasal or lung inhalers for other symptoms.  HOME CARE INSTRUCTIONS   Only take over-the-counter or prescription medicines for pain, discomfort, or fever as directed  by your caregiver.   Use a warm mist humidifier or inhale steam from a shower to increase air moisture. This may keep secretions moist and make it easier to breathe.   Drink enough water and fluids to keep your urine clear or pale yellow.   Rest as needed.   Return to work when your temperature has returned to normal or as your caregiver advises. You may need to stay home longer to avoid infecting others. You can also use a face mask and careful hand washing to prevent spread of the virus.  SEEK MEDICAL CARE IF:   After the first few days, you feel you are getting worse rather than better.   You need your caregiver's advice about medicines to control symptoms.   You develop chills, worsening shortness of breath, or brown or red sputum. These may be signs of pneumonia.   You develop yellow or brown nasal discharge or pain in the face, especially when you bend forward. These may be signs of sinusitis.   You develop a fever, swollen neck glands, pain with swallowing, or white areas in the back of your throat. These may be signs of strep throat.  SEEK IMMEDIATE MEDICAL CARE IF:   You have a fever.   You develop severe or persistent headache, ear pain, sinus pain, or chest pain.   You develop wheezing, a prolonged cough, cough up blood, or have a change in your usual mucus (if you have chronic lung disease).   You develop sore muscles or a stiff neck.  Document Released: 12/24/2000 Document Revised: 06/19/2011 Document Reviewed: 11/01/2010 [facility] Patient Information 2012 [place], [place].  Bronchitis Bronchitis is the body's way of reacting to injury and/or infection (inflammation) of the bronchi. Bronchi are the air tubes that extend from the windpipe into the lungs. If the inflammation becomes severe, it may cause shortness of breath. CAUSES  Inflammation may be caused by:  A virus.   Germs (bacteria).   Dust.   Allergens.   Pollutants and many other irritants.  The cells  lining the bronchial tree are covered with tiny hairs (cilia). These constantly beat upward, away from the lungs, toward the mouth. This keeps the lungs free of pollutants. When these cells become too irritated and are unable to do their job, mucus begins to develop. This causes the characteristic cough of bronchitis. The cough clears the lungs when the cilia are unable to do their job. Without either of these protective mechanisms, the mucus would settle in the lungs. Then you would develop pneumonia. Smoking is a common cause of bronchitis and can contribute to pneumonia. Stopping this habit is the single most important thing you can do to help yourself. TREATMENT   Your caregiver may prescribe an antibiotic if the cough is caused by bacteria. Also, medicines that open up your airways make it easier to breathe. Your caregiver may also recommend or prescribe an expectorant. It will loosen the mucus to be coughed up. Only take over-the-counter or prescription medicines for pain, discomfort, or fever as directed by your caregiver.   Removing whatever causes the problem (smoking, for example) is critical to preventing the problem from getting worse.   Cough suppressants  may be prescribed for relief of cough symptoms.   Inhaled medicines may be prescribed to help with symptoms now and to help prevent problems from returning.   For those with recurrent (chronic) bronchitis, there may be a need for steroid medicines.  SEEK IMMEDIATE MEDICAL CARE IF:   During treatment, you develop more pus-like mucus (purulent sputum).   You have a fever.   Your baby is older than 3 months with a rectal temperature of 102 F (38.9 C) or higher.   Your baby is 61 months old or younger with a rectal temperature of 100.4 F (38 C) or higher.   You become progressively more ill.   You have increased difficulty breathing, wheezing, or shortness of breath.  It is necessary to seek immediate medical care if you are  elderly or sick from any other disease. MAKE SURE YOU:   Understand these instructions.   Will watch your condition.   Will get help right away if you are not doing well or get worse.  Document Released: 06/30/2005 Document Revised: 06/19/2011 Document Reviewed: 05/09/2008 [facility] Patient Information 2012 [place], [place].

## 2011-11-26 NOTE — Telephone Encounter (Signed)
Will see padonda today 

## 2011-11-26 NOTE — Progress Notes (Signed)
Subjective:    Patient ID: [name], female    DOB: 01-Aug-1955, 56 y.o.   MRN: [id number]  HPI 56 year old white female, nonsmoker, patient of Dr. Lovell Sheehan is in today with complaints of cough, congestion x1 week. Her symptoms have remained stable despite take an over-the-counter medication. She's had fevers particularly at night maxing at 102.6. She denies any lightheadedness, dizziness, chest pain, palpitations, shortness of breath or edema.   Review of Systems  Constitutional: Positive for fever and chills.  HENT: Positive for congestion, sore throat, rhinorrhea and postnasal drip.   Respiratory: Positive for cough.   Cardiovascular: Negative.   Musculoskeletal: Negative.   Skin: Negative.   Neurological: Negative.   Hematological: Negative.   Psychiatric/Behavioral: Negative.    Past Medical History  Diagnosis Date  . Depression   . GERD (gastroesophageal reflux disease)   . Anxiety   . Arthritis     Rheumatoid  . IBS (irritable bowel syndrome)     constipation predominant  . Interstitial cystitis   . Lupus   . Hemorrhoids   . Raynaud disease     History   Social History  . Marital Status: Married    Spouse Name: N/A    Number of Children: N/A  . Years of Education: N/A   Occupational History  . RN [employer]   Social History Main Topics  . Smoking status: Never Smoker   . Smokeless tobacco: Never Used  . Alcohol Use: No  . Drug Use: No  . Sexually Active: Not on file   Other Topics Concern  . Not on file   Social History Narrative  . No narrative on file    Past Surgical History  Procedure Date  . Abdominal hysterectomy 2007  . Tonsillectomy   . Excision morton's neuroma   . Colonoscopy 12/24/04    internal and external hemorrhoids  . Flexible sigmoidoscopy 03/07/08    internal and external hemorrhoids  . Upper gastrointestinal endoscopy 10/23/10    gastritis, irregular Z-line    Family History  Problem Relation Age of Onset  .  Coronary artery disease Father   . Diabetes      uncle  . Diverticulosis Mother   . Hypertension Mother   . Osteoarthritis Mother   . Colon cancer Neg Hx     Allergies  Allergen Reactions  . Doxycycline     REACTION: Rash  . Morphine Sulfate     REACTION: nausea and vomiting  . Penicillins   . Promethazine Hcl     Current Outpatient Prescriptions on File Prior to Visit  Medication Sig Dispense Refill  . bisacodyl (BISACODYL) 5 MG EC tablet Take 5 mg by mouth daily as needed. Take 1-4 tablets by mouth daily as needed       . calcium carbonate (OS-CAL) 600 MG TABS Take 600 mg by mouth 2 (two) times daily with a meal.        . Cholecalciferol (VITAMIN D3) 2000 UNITS TABS Take 1 capsule by mouth as directed.        . clonazePAM (KLONOPIN) 1 MG tablet Take 0.5 tablets (0.5 mg total) by mouth at bedtime as needed.  30 tablet  3  . dicyclomine (BENTYL) 20 MG tablet Take one tablet every 6 hrs as needed for pain  60 tablet  0  . DPH-Lido-AlHydr-MgHydr-Simeth (FIRST-MOUTHWASH BLM) SUSP RINSE AND SWALLOW 15-30 ML'S AS NEEDED  480 mL  0  . esomeprazole (NEXIUM) 40 MG capsule Take 1 capsule (40 mg  total) by mouth daily before breakfast.  30 capsule  11  . estradiol (ESTRACE) 1 MG tablet Take 1 mg by mouth daily.        . heparin 10272 UNIT/ML injection 10 Units/kg by Bladder Irrigation route once. Per Dr Logan Bores, Urologist       . hyoscyamine (LEVSIN SL) 0.125 MG SL tablet Place 0.125 mg under the tongue every 6 (six) hours as needed.      . methotrexate 1 GM/40ML SOLN injection Inject 0.4 mg weekly       . Multiple Vitamin (MULTIVITAMIN PO) Take 1 tablet by mouth daily.        . sucralfate (CARAFATE) 1 GM/10ML suspension Take 10 mLs (1 g total) by mouth 3 (three) times daily between meals.  480 mL  1  . venlafaxine (EFFEXOR) 75 MG tablet Take 1 tablet (75 mg total) by mouth 2 (two) times daily.  180 tablet  3  . zolpidem (AMBIEN) 10 MG tablet TAKE 1 TABLET BY MOUTH EVERY NIGHT AT BEDTIME AS  NEEDED FOR SLEEP  20 tablet  2  . ZOMIG 5 MG tablet ONE TABLET ORALLY AT THE TIME OF HEADACHE. MAY REPEAT X1 IN ONE HOUR IF HEADACHE PERSISTS.  9 tablet  3    BP 100/68  Pulse 103  Temp(Src) 98.9 F (37.2 C) (Oral)  Wt 117 lb (53.071 kg)  SpO2 97%chart    Objective:   Physical Exam  Constitutional: She is oriented to person, place, and time. She appears well-developed and well-nourished.  HENT:  Right Ear: External ear normal.  Left Ear: External ear normal.  Nose: Nose normal.  Mouth/Throat: Oropharynx is clear and moist.  Neck: Normal range of motion. Neck supple.  Cardiovascular: Normal rate, regular rhythm and normal heart sounds.   Pulmonary/Chest: Effort normal and breath sounds normal.  Abdominal: Soft. Bowel sounds are normal.  Musculoskeletal: Normal range of motion.  Neurological: She is alert and oriented to person, place, and time.  Skin: Skin is warm and dry.  Psychiatric: She has a normal mood and affect.          Assessment & Plan:  Assessment: Acute bronchitis, cough  Plan: Z-Pak as directed. Tussionex 1 teaspoon twice a day when necessary cough. Rest. Drink plenty of fluids. Patient call the office symptoms worsen or persist. Recheck a schedule, when necessary.

## 2011-11-26 NOTE — Progress Notes (Signed)
Addended byAdline Mango B on: 11/26/2011 11:32 AM   Modules accepted: Orders

## 2011-12-01 ENCOUNTER — Telehealth: Payer: Self-pay

## 2011-12-01 MED ORDER — METHYLPREDNISOLONE 4 MG PO KIT: PACK | ORAL | Status: AC

## 2011-12-01 NOTE — Telephone Encounter (Signed)
No other recommendations

## 2011-12-01 NOTE — Telephone Encounter (Signed)
We have treated the infection. It is likely the issue is inflammation. I have faxed a medrol dose pack to the pharmacy.

## 2011-12-01 NOTE — Telephone Encounter (Signed)
Pt states she was in to see NP on last week for bronchitis.  Pt states she finished her medication on yesterday and pt is still stuffed up and coughing.  Pt would like another round of antibiotics sent to pharmacy.  Pt states if possible pt would like diflucan as well due to usually having symptoms after having a round of antibiotics.  Pls advise.

## 2011-12-01 NOTE — Telephone Encounter (Signed)
Called and spoke with pt about about medrol pak.  Pt states in the meantime she has a low grade fever of 99.8.  Pt advised that antibotic continues to work in the system after being finished.  Pls advise on any other recommendations.

## 2011-12-09 ENCOUNTER — Telehealth: Payer: Self-pay | Admitting: Internal Medicine

## 2011-12-09 DIAGNOSIS — K645 Perianal venous thrombosis: Secondary | ICD-10-CM

## 2011-12-09 NOTE — Telephone Encounter (Signed)
Patient reports to Dr. Leone Payor that she has a thrombosed hemorrhoid.  She has c/o 4 days of rectal pain.  She has tried sitz bath and OTC products with no improvement.  Per Dr. Leone Payor she needs an appt with [facility] urgent [facility] for today or tomorrow.  She will see Dr. Daphine Deutscher tomorrow at 4:30.  She is advised that she needs to arrive at 4:00 for paperwork.

## 2011-12-10 ENCOUNTER — Ambulatory Visit (INDEPENDENT_AMBULATORY_CARE_PROVIDER_SITE_OTHER): Payer: Commercial Managed Care - PPO | Admitting: Surgery

## 2011-12-10 ENCOUNTER — Encounter (INDEPENDENT_AMBULATORY_CARE_PROVIDER_SITE_OTHER): Payer: Self-pay | Admitting: Surgery

## 2011-12-10 VITALS — BP 110/78 | HR 84 | Temp 95.2°F | Resp 18 | Ht 63.0 in | Wt 117.0 lb

## 2011-12-10 DIAGNOSIS — K645 Perianal venous thrombosis: Secondary | ICD-10-CM

## 2011-12-10 NOTE — Progress Notes (Signed)
URGENT Office ELVERTA DIMICELI 56 y.o.  Body mass index is 20.73 kg/(m^2).  Patient Active Problem List  Diagnoses  . UNSPECIFIED VITAMIN D DEFICIENCY  . ANXIETY  . DEPRESSION  . HEMORRHOIDS  . GERD  . IRRITABLE BOWEL SYNDROME  . INTERSTITIAL CYSTITIS  . CYST, OVARIAN NEC/NOS  . HYPERTENSION, GESTATIONAL  . Gastritis  . SLE (systemic lupus erythematosus)    Allergies  Allergen Reactions  . Doxycycline     REACTION: Rash  . Morphine Sulfate     REACTION: nausea and vomiting  . Penicillins   . Promethazine Hcl     Past Surgical History  Procedure Date  . Abdominal hysterectomy 2007  . Tonsillectomy   . Excision morton's neuroma   . Colonoscopy 12/24/04    internal and external hemorrhoids  . Flexible sigmoidoscopy 03/07/08    internal and external hemorrhoids  . Upper gastrointestinal endoscopy 10/23/10    gastritis, irregular Z-line   Judie Petit, MD, MD No diagnosis found.   Ms. Mackiewicz comes in with a several day history of increasing abdominal pain. She has been working in regard and has had the development of a thrombosed external hemorrhoid.  Mrs. on her left anal region and a tender one is about a three-quarter centimeter in diameter. There is one adjacent is also swollen between the two they are prolapsed.    I anesthetized the area with lidocaine/epinephrine/sodium bicarbonate. I excised the overlying skin and evacuated the clots. I will give her a prescription for Lortab for pain and will see her back in 3 weeks. I instructed her to take sitz baths and to reduce her hemorrhoids if they start coming out after a bowel movement. Matt B. Daphine Deutscher, MD, [facility] [phone] beeper [phone]  12/10/2011 5:33 PM

## 2011-12-11 ENCOUNTER — Telehealth (INDEPENDENT_AMBULATORY_CARE_PROVIDER_SITE_OTHER): Payer: Self-pay

## 2011-12-11 NOTE — Telephone Encounter (Signed)
The patient just had thrombosed hemorrhoids excised yesterday.  She is still having some pain and oozing.  She wanted to know how long it should last.  I told her up to a week but should get better after a few days.  She needs a 3 week follow up and states she is off on Wednesdays.  She can also do June 14th late afternoon.  I told her Dr Daphine Deutscher is overbooked that day and doesn't have a Wednesday clinic until July 3rd.  I will route this to Annabelle Harman so she can work on the follow up.

## 2011-12-15 ENCOUNTER — Telehealth (INDEPENDENT_AMBULATORY_CARE_PROVIDER_SITE_OTHER): Payer: Self-pay | Admitting: General Surgery

## 2011-12-15 NOTE — Telephone Encounter (Signed)
Pt calling reporting still having a lot of swelling and pain.  Thrombosed hems seen in office last Wednesday.  She is still soaking in warm baths, using usual hem products and lidocaine cream.  Reassured her the pain would improve as the swelling is resolved, may take up to 10-14 days.  She understands.

## 2011-12-18 ENCOUNTER — Telehealth (INDEPENDENT_AMBULATORY_CARE_PROVIDER_SITE_OTHER): Payer: Self-pay | Admitting: General Surgery

## 2011-12-18 NOTE — Telephone Encounter (Signed)
Called patient to check on her status since her urgent office visit on 12/10/11. Patient stated the bleeding stopped yesterday and she is doing better. However, it still hurts when she has a bowel movement. She believes there is another hemorrhoid that may not have resolved since visit on 12/10/11. Patient still would like to be seen based on what was discussed with Dr. Daphine Deutscher during urgent office visit. I advised her the information would be forwarded to Dr. Daphine Deutscher and his nurse in order to determine when she can be seen. I advised her that I cannot add her to an already full schedule without his permission. The patient understood and confirmed that she can be called back on either the home or work number.

## 2012-02-16 ENCOUNTER — Telehealth: Payer: Self-pay | Admitting: Internal Medicine

## 2012-02-16 ENCOUNTER — Ambulatory Visit (INDEPENDENT_AMBULATORY_CARE_PROVIDER_SITE_OTHER): Payer: Commercial Managed Care - PPO | Admitting: Internal Medicine

## 2012-02-16 ENCOUNTER — Encounter: Payer: Self-pay | Admitting: Internal Medicine

## 2012-02-16 VITALS — BP 110/70 | HR 107 | Temp 98.1°F | Ht 63.0 in | Wt 115.0 lb

## 2012-02-16 DIAGNOSIS — R42 Dizziness and giddiness: Secondary | ICD-10-CM

## 2012-02-16 DIAGNOSIS — R002 Palpitations: Secondary | ICD-10-CM | POA: Insufficient documentation

## 2012-02-16 LAB — MAGNESIUM: Magnesium: 2.2 mg/dL (ref 1.5–2.5)

## 2012-02-16 LAB — CBC WITH DIFFERENTIAL/PLATELET
Basophils Absolute: 0 10*3/uL (ref 0.0–0.1)
Basophils Relative: 0.3 % (ref 0.0–3.0)
Eosinophils Absolute: 0.1 10*3/uL (ref 0.0–0.7)
Eosinophils Relative: 1.7 % (ref 0.0–5.0)
HCT: 42.6 % (ref 36.0–46.0)
Hemoglobin: 14.2 g/dL (ref 12.0–15.0)
Lymphocytes Relative: 14 % (ref 12.0–46.0)
Lymphs Abs: 0.8 10*3/uL (ref 0.7–4.0)
MCHC: 33.4 g/dL (ref 30.0–36.0)
MCV: 96.4 fl (ref 78.0–100.0)
Monocytes Absolute: 0.5 10*3/uL (ref 0.1–1.0)
Monocytes Relative: 8.3 % (ref 3.0–12.0)
Neutro Abs: 4.4 10*3/uL (ref 1.4–7.7)
Neutrophils Relative %: 75.7 % (ref 43.0–77.0)
Platelets: 197 10*3/uL (ref 150.0–400.0)
RBC: 4.42 Mil/uL (ref 3.87–5.11)
RDW: 14.6 % (ref 11.5–14.6)
WBC: 5.9 10*3/uL (ref 4.5–10.5)

## 2012-02-16 LAB — BASIC METABOLIC PANEL
BUN: 12 mg/dL (ref 6–23)
CO2: 29 mEq/L (ref 19–32)
Calcium: 9.6 mg/dL (ref 8.4–10.5)
Chloride: 104 mEq/L (ref 96–112)
Creatinine, Ser: 0.8 mg/dL (ref 0.4–1.2)
GFR: 76.78 mL/min (ref >60.00)
Glucose, Bld: 95 mg/dL (ref 70–99)
Potassium: 4.7 mEq/L (ref 3.5–5.1)
Sodium: 139 mEq/L (ref 135–145)

## 2012-02-16 LAB — T4, FREE: Free T4: 0.79 ng/dL (ref 0.60–1.60)

## 2012-02-16 LAB — D-DIMER, QUANTITATIVE: D-Dimer, Quant: 0.43 ug/mL-FEU (ref 0.00–0.48)

## 2012-02-16 LAB — TSH: TSH: 1.13 u[IU]/mL (ref 0.35–5.50)

## 2012-02-16 NOTE — Telephone Encounter (Signed)
Caller: [place]ayah/Patient; PCP: [name]; CB#: [phone];  Call regarding Dizzy Nausea and Palpitations No Chest Pain; Onset 02/14/12.  Night sweats, "but different from menopause symptoms."    States she does not know how fast the pulse is .  She has been on Amlodipine, a calcium channel blocker, for Raynaud's Disease  and continues to have ongoingor  repeated episodes of irregular pulse.  Call Provider Immediately per Irregular Heartbeat protocol.  Appointment with Dr.  Artist Pais at [phone].

## 2012-02-16 NOTE — Assessment & Plan Note (Signed)
56 year old white female complains of intermittent palpitations. Her EKG shows normal sinus rhythm at 90 beats per minute. Possible right atrial enlargement. Patient is on home replacement therapy and has family history of pulmonary embolism. Check d-dimer.  She is not orthostatic. Monitor patient for now. We discussed further cardiac workup if she has persistent symptoms. Reassess in one week.  Patient advised to call office if symptoms persist or worsen.  Check electrolytes, TFTs, and magnesium level

## 2012-02-16 NOTE — Progress Notes (Signed)
Subjective:    Patient ID: [name], female    DOB: [birth year], 56 y.o.   MRN: [id number]  HPI  56 year old white female with history of chronic fatigue and unexplained joint pains complains of lightheadedness and palpitations for 2 days. Patient feels like her heart is "doing flip-flops". She can feel heart palpitations in her head. She has mild associated nausea. No specific pain. She denies chest pain or shortness of breath. She denies preceding illness.  She has family history of sudden cardiac death. Her paternal uncle died at age 59. Father died at age 36 of acute myocardial infarction. He also has history of pulmonary embolism.  Patient has been to several specialists for evaluation of chronic fatigue and joint pains (bilateral hands and hips) over last several years. Patient reports previous MRI of brain showed nonspecific changes and white matter. MS was ruled out and patient was referred to local rheumatologist. At first they thought she may have had SLE but now they're considering possible rheumatoid arthritis.  She is currently taking methotrexate.  Patient reports undergoing stress test in 2007 for chest pain. This is reported normal.  She drinks one cup of coffee per day.  Review of Systems  Negative for chest pain or shortness of breath Mild nausea but no vomiting  Past Medical History  Diagnosis Date  . Depression   . GERD (gastroesophageal reflux disease)   . Anxiety   . Arthritis     Rheumatoid  . IBS (irritable bowel syndrome)     constipation predominant  . Interstitial cystitis   . Lupus   . Hemorrhoids   . Raynaud disease     History   Social History  . Marital Status: Married    Spouse Name: N/A    Number of Children: N/A  . Years of Education: N/A   Occupational History  . RN [employer]   Social History Main Topics  . Smoking status: Never Smoker   . Smokeless tobacco: Never Used  . Alcohol Use: No  . Drug Use: No  . Sexually  Active: Not on file   Other Topics Concern  . Not on file   Social History Narrative  . No narrative on file    Past Surgical History  Procedure Date  . Abdominal hysterectomy 2007  . Tonsillectomy   . Excision morton's neuroma   . Colonoscopy 12/24/04    internal and external hemorrhoids  . Flexible sigmoidoscopy 03/07/08    internal and external hemorrhoids  . Upper gastrointestinal endoscopy 10/23/10    gastritis, irregular Z-line    Family History  Problem Relation Age of Onset  . Coronary artery disease Father   . Diabetes      uncle  . Diverticulosis Mother   . Hypertension Mother   . Osteoarthritis Mother   . Colon cancer Neg Hx     Allergies  Allergen Reactions  . Doxycycline     REACTION: Rash  . Morphine Sulfate     REACTION: nausea and vomiting  . Penicillins   . Promethazine Hcl     Current Outpatient Prescriptions on File Prior to Visit  Medication Sig Dispense Refill  . bisacodyl (BISACODYL) 5 MG EC tablet Take 5 mg by mouth daily as needed. Take 1-4 tablets by mouth daily as needed       . calcium carbonate (OS-CAL) 600 MG TABS Take 600 mg by mouth 2 (two) times daily with a meal.        . chlorpheniramine-HYDROcodone (  TUSSIONEX PENNKINETIC ER) 10-8 MG/5ML LQCR Take 5 mLs by mouth every 12 (twelve) hours as needed.  140 mL  0  . Cholecalciferol (VITAMIN D3) 2000 UNITS TABS Take 1 capsule by mouth as directed.        . clonazePAM (KLONOPIN) 1 MG tablet Take 0.5 tablets (0.5 mg total) by mouth at bedtime as needed.  30 tablet  3  . dicyclomine (BENTYL) 20 MG tablet Take one tablet every 6 hrs as needed for pain  60 tablet  0  . DPH-Lido-AlHydr-MgHydr-Simeth (FIRST-MOUTHWASH BLM) SUSP RINSE AND SWALLOW 15-30 ML'S AS NEEDED  480 mL  0  . esomeprazole (NEXIUM) 40 MG capsule Take 1 capsule (40 mg total) by mouth daily before breakfast.  30 capsule  11  . estradiol (ESTRACE) 1 MG tablet Take 1 mg by mouth daily.        . heparin 16109 UNIT/ML injection 10  Units/kg by Bladder Irrigation route once. Per Dr Logan Bores, Urologist       . hyoscyamine (LEVSIN SL) 0.125 MG SL tablet Place 0.125 mg under the tongue every 6 (six) hours as needed.      . methotrexate 1 GM/40ML SOLN injection Inject 0.4 mg weekly       . Multiple Vitamin (MULTIVITAMIN PO) Take 1 tablet by mouth daily.        . sucralfate (CARAFATE) 1 GM/10ML suspension Take 10 mLs (1 g total) by mouth 3 (three) times daily between meals.  480 mL  1  . venlafaxine (EFFEXOR) 75 MG tablet Take 1 tablet (75 mg total) by mouth 2 (two) times daily.  180 tablet  3  . zolpidem (AMBIEN) 10 MG tablet TAKE 1 TABLET BY MOUTH EVERY NIGHT AT BEDTIME AS NEEDED FOR SLEEP  20 tablet  2  . ZOMIG 5 MG tablet ONE TABLET ORALLY AT THE TIME OF HEADACHE. MAY REPEAT X1 IN ONE HOUR IF HEADACHE PERSISTS.  9 tablet  3    BP 110/70  Pulse 107  Temp 98.1 F (36.7 C) (Oral)  Ht 5\' 3"  (1.6 m)  Wt 115 lb (52.164 kg)  BMI 20.37 kg/m2  SpO2 98%  Blood pressure is 110/70 sitting and 110/70 standing EKG shows normal sinus rhythm at 90 beats per minute. Possible right atrial enlargement.      Objective:   Physical Exam  Constitutional: She is oriented to person, place, and time. She appears well-developed and well-nourished. No distress.  HENT:  Head: Normocephalic and atraumatic.  Right Ear: External ear normal.  Mouth/Throat: Oropharynx is clear and moist.  Neck: Neck supple.  Cardiovascular: Normal rate, regular rhythm and normal heart sounds.   No murmur heard. Pulmonary/Chest: Effort normal and breath sounds normal. She has no wheezes.  Abdominal: Soft. Bowel sounds are normal. She exhibits no mass. There is no tenderness.  Musculoskeletal: She exhibits no edema.  Lymphadenopathy:    She has no cervical adenopathy.  Neurological: She is alert and oriented to person, place, and time. No cranial nerve deficit.  Skin: Skin is warm and dry.  Psychiatric: She has a normal mood and affect. Her behavior is normal.        Assessment & Plan:

## 2012-02-17 ENCOUNTER — Telehealth: Payer: Self-pay | Admitting: Family Medicine

## 2012-02-17 NOTE — Telephone Encounter (Signed)
Pt called req Stat Lab Results. Pls call back asap at work #.

## 2012-02-17 NOTE — Telephone Encounter (Signed)
Call pt - lab results (electrolytes, kidney function, magnesium level, thyroid functions, and d-dimer) all normal

## 2012-02-17 NOTE — Telephone Encounter (Signed)
Pt aware.

## 2012-02-17 NOTE — Telephone Encounter (Signed)
To: [facility] (After Hours Triage) Fax: [phone] From: [facility] Date/ Time: 02/16/2012 10:07 PM Taken By: Forbes Cellar, CSR Caller: Delaney Meigs Facility: [facility] Patient: [name], [name] DOB: 31-Mar-1956 Phone: [phone] Reason for Call: Delaney Meigs is calling from [facility] regarding a D-dimer ordered on [name] by Yoo8/11/2011 2:58:00 PM. The results were normal 0.43.

## 2012-02-25 ENCOUNTER — Ambulatory Visit: Payer: Commercial Managed Care - PPO | Admitting: Internal Medicine

## 2012-03-02 ENCOUNTER — Ambulatory Visit: Payer: Commercial Managed Care - PPO | Admitting: Internal Medicine

## 2012-03-10 ENCOUNTER — Ambulatory Visit (INDEPENDENT_AMBULATORY_CARE_PROVIDER_SITE_OTHER): Payer: Commercial Managed Care - PPO | Admitting: Internal Medicine

## 2012-03-10 ENCOUNTER — Encounter: Payer: Self-pay | Admitting: Internal Medicine

## 2012-03-10 ENCOUNTER — Telehealth: Payer: Self-pay | Admitting: Internal Medicine

## 2012-03-10 VITALS — BP 118/74 | HR 68 | Temp 98.6°F | Wt 116.0 lb

## 2012-03-10 DIAGNOSIS — R002 Palpitations: Secondary | ICD-10-CM

## 2012-03-10 NOTE — Assessment & Plan Note (Signed)
Patient's electrolytes, TFTs and magnesium level are normal. Patient is having persistent palpitations associated with dizziness and nausea. Refer to cardiology for further evaluation.

## 2012-03-10 NOTE — Telephone Encounter (Signed)
Pt requesting refill on;  clonazePAM (KLONOPIN) 1 MG tablet

## 2012-03-10 NOTE — Patient Instructions (Addendum)
Our office will contact you re: cardiology referral. Please avoid all caffeinated beverages

## 2012-03-10 NOTE — Progress Notes (Signed)
Subjective:    Patient ID: [name], female    DOB: September 15, 1955, 56 y.o.   MRN: [id number]  HPI  56 year old white female previously seen for palpitations for followup. Patient's electrolytes, kidney function, thyroid function studies and d-dimer were all normal.  Patient continues to have intermittent palpitations. She reports at least 3-4 episodes per week. They usually occur in the afternoon. They can last anywhere between half hour to 2 hours. She frequently has associated dizziness and nausea.  She drinks one cup of coffee in the morning.  Review of Systems Negative for chest pain. She walks on a regular basis.  Past Medical History  Diagnosis Date  . Depression   . GERD (gastroesophageal reflux disease)   . Anxiety   . Arthritis     Rheumatoid  . IBS (irritable bowel syndrome)     constipation predominant  . Interstitial cystitis   . Lupus   . Hemorrhoids   . Raynaud disease     History   Social History  . Marital Status: Married    Spouse Name: N/A    Number of Children: N/A  . Years of Education: N/A   Occupational History  . RN [employer]   Social History Main Topics  . Smoking status: Never Smoker   . Smokeless tobacco: Never Used  . Alcohol Use: No  . Drug Use: No  . Sexually Active: Not on file   Other Topics Concern  . Not on file   Social History Narrative  . No narrative on file    Past Surgical History  Procedure Date  . Abdominal hysterectomy 2007  . Tonsillectomy   . Excision morton's neuroma   . Colonoscopy 12/24/04    internal and external hemorrhoids  . Flexible sigmoidoscopy 03/07/08    internal and external hemorrhoids  . Upper gastrointestinal endoscopy 10/23/10    gastritis, irregular Z-line    Family History  Problem Relation Age of Onset  . Coronary artery disease Father   . Diabetes      uncle  . Diverticulosis Mother   . Hypertension Mother   . Osteoarthritis Mother   . Colon cancer Neg Hx     Allergies    Allergen Reactions  . Doxycycline     REACTION: Rash  . Morphine Sulfate     REACTION: nausea and vomiting  . Penicillins   . Promethazine Hcl     Current Outpatient Prescriptions on File Prior to Visit  Medication Sig Dispense Refill  . amLODipine (NORVASC) 5 MG tablet Take 5 mg by mouth daily.      . bisacodyl (BISACODYL) 5 MG EC tablet Take 5 mg by mouth daily as needed. Take 1-4 tablets by mouth daily as needed       . calcium carbonate (OS-CAL) 600 MG TABS Take 600 mg by mouth 2 (two) times daily with a meal.        . chlorpheniramine-HYDROcodone (TUSSIONEX PENNKINETIC ER) 10-8 MG/5ML LQCR Take 5 mLs by mouth every 12 (twelve) hours as needed.  140 mL  0  . Cholecalciferol (VITAMIN D3) 2000 UNITS TABS Take 1 capsule by mouth as directed.        . clonazePAM (KLONOPIN) 1 MG tablet Take 0.5 tablets (0.5 mg total) by mouth at bedtime as needed.  30 tablet  3  . dicyclomine (BENTYL) 20 MG tablet Take one tablet every 6 hrs as needed for pain  60 tablet  0  . DPH-Lido-AlHydr-MgHydr-Simeth (FIRST-MOUTHWASH BLM) SUSP RINSE AND  SWALLOW 15-30 ML'S AS NEEDED  480 mL  0  . esomeprazole (NEXIUM) 40 MG capsule Take 1 capsule (40 mg total) by mouth daily before breakfast.  30 capsule  11  . estradiol (ESTRACE) 1 MG tablet Take 1 mg by mouth daily.        . hyoscyamine (LEVSIN SL) 0.125 MG SL tablet Place 0.125 mg under the tongue every 6 (six) hours as needed.      . methotrexate 1 GM/40ML SOLN injection Inject 0.4 mg weekly       . Multiple Vitamin (MULTIVITAMIN PO) Take 1 tablet by mouth daily.        . sucralfate (CARAFATE) 1 GM/10ML suspension Take 10 mLs (1 g total) by mouth 3 (three) times daily between meals.  480 mL  1  . venlafaxine (EFFEXOR) 75 MG tablet Take 1 tablet (75 mg total) by mouth 2 (two) times daily.  180 tablet  3  . zolpidem (AMBIEN) 10 MG tablet TAKE 1 TABLET BY MOUTH EVERY NIGHT AT BEDTIME AS NEEDED FOR SLEEP  20 tablet  2  . ZOMIG 5 MG tablet ONE TABLET ORALLY AT THE  TIME OF HEADACHE. MAY REPEAT X1 IN ONE HOUR IF HEADACHE PERSISTS.  9 tablet  3    BP 118/74  Pulse 68  Temp 98.6 F (37 C) (Oral)  Wt 116 lb (52.617 kg)       Objective:   Physical Exam  Constitutional: She is oriented to person, place, and time. She appears well-developed and well-nourished.  Cardiovascular: Normal rate, regular rhythm and normal heart sounds.   No murmur heard. Pulmonary/Chest: Effort normal and breath sounds normal. She has no wheezes.  Neurological: She is alert and oriented to person, place, and time. No cranial nerve deficit.  Psychiatric: She has a normal mood and affect. Her behavior is normal.          Assessment & Plan:

## 2012-03-11 MED ORDER — CLONAZEPAM 1 MG PO TABS: 0.5000 mg | ORAL_TABLET | Freq: Every evening | ORAL | Status: DC | PRN

## 2012-03-11 NOTE — Telephone Encounter (Signed)
rx called in

## 2012-03-26 ENCOUNTER — Other Ambulatory Visit (INDEPENDENT_AMBULATORY_CARE_PROVIDER_SITE_OTHER): Payer: Commercial Managed Care - PPO

## 2012-03-26 ENCOUNTER — Telehealth: Payer: Self-pay | Admitting: Internal Medicine

## 2012-03-26 DIAGNOSIS — R300 Dysuria: Secondary | ICD-10-CM

## 2012-03-26 LAB — URINALYSIS, ROUTINE W REFLEX MICROSCOPIC
Bilirubin Urine: NEGATIVE
Hgb urine dipstick: NEGATIVE
Ketones, ur: NEGATIVE
Leukocytes, UA: NEGATIVE
Nitrite: NEGATIVE
Specific Gravity, Urine: 1.025 (ref 1.000–1.030)
Total Protein, Urine: NEGATIVE
Urine Glucose: NEGATIVE
Urobilinogen, UA: 0.2 (ref 0.0–1.0)
pH: 6.5 (ref 5.0–8.0)

## 2012-03-26 NOTE — Telephone Encounter (Signed)
Having urinary frequency and burning with some lower abdominal pain and pelvic pain. X 3 days Pyridium not helping,

## 2012-03-26 NOTE — Telephone Encounter (Signed)
Spoke to the pt and informed her that Dr. Cato Mulligan is out of the office.  Tried to make an appt for her with one of the other providers.  She is unable to get off work to be seen.  She will visit an urgent care after work.

## 2012-03-26 NOTE — Telephone Encounter (Signed)
Caller: [name]/Patient; Patient Name: [name]; PCP: [name] (Adults only); Best Callback Phone Number: [phone]; Call regarding Urinary frequency, Urethra spasms and Lower & Back Abdominal Pain, onset 9-10. History of Interstitial Cystitis, Patient requesting UA order from Dr Cato Mulligan, works at [employer], states she has in/out cath, she will do sterile catch. All emergent symptoms ruled out per Urinary Symptoms Protocol, see in 4 hours, due to urinary tract symtpoms and low back pain, appointment offered, Patient unable to miss work.  Patient uses [facility], [place]. [facility] as listed in Epic. PLEASE FOLLOW UP WITH PATIENT.

## 2012-03-31 ENCOUNTER — Encounter: Payer: Self-pay | Admitting: Cardiovascular Disease

## 2012-03-31 ENCOUNTER — Ambulatory Visit (INDEPENDENT_AMBULATORY_CARE_PROVIDER_SITE_OTHER): Payer: Commercial Managed Care - PPO | Admitting: Cardiovascular Disease

## 2012-03-31 ENCOUNTER — Encounter (INDEPENDENT_AMBULATORY_CARE_PROVIDER_SITE_OTHER): Payer: Commercial Managed Care - PPO

## 2012-03-31 VITALS — BP 123/84 | HR 73 | Ht 63.0 in | Wt 116.0 lb

## 2012-03-31 DIAGNOSIS — I10 Essential (primary) hypertension: Secondary | ICD-10-CM | POA: Insufficient documentation

## 2012-03-31 DIAGNOSIS — R079 Chest pain, unspecified: Secondary | ICD-10-CM

## 2012-03-31 DIAGNOSIS — R002 Palpitations: Secondary | ICD-10-CM

## 2012-03-31 HISTORY — DX: Essential (primary) hypertension: I10

## 2012-03-31 NOTE — Patient Instructions (Addendum)
Your physician recommends that you schedule a follow-up appointment in:  6-7 weeks  Your physician has requested that you have a stress echocardiogram. For further information please visit [email or web address] Please follow instruction sheet as given.  Your physician has recommended that you wear an event monitor. Event monitors are medical devices that record the heart's electrical activity. Doctors most often [place] these monitors to diagnose arrhythmias. Arrhythmias are problems with the speed or rhythm of the heartbeat. The monitor is a small, portable device. You can wear one while you do your normal daily activities. This is usually used to diagnose what is causing palpitations/syncope (passing out).

## 2012-03-31 NOTE — Assessment & Plan Note (Signed)
Well controlled.  Continue current medications and low sodium Dash type diet.   Continue amlodipine

## 2012-03-31 NOTE — Assessment & Plan Note (Signed)
Inf setting of palpitations and family history will get stress echo

## 2012-03-31 NOTE — Progress Notes (Signed)
Patient ID: [name], female   DOB: October 13, 1955, 56 y.o.   MRN: [id number] 56 year old white female referred for palpitations . Patient's electrolytes, kidney function, thyroid function studies and d-dimer were all normal.  Patient continues to have intermittent palpitations. She reports at least 3-4 episodes per week. They usually occur in the afternoon. They can last anywhere between half hour to 2 hours. She frequently has associated dizziness and nausea.  Initially seen 3 months ago for same.  Family history of premature CAD  No inciting events.  S/P hysterectomy and stopped hormones recently but does not think this coincides with palpitations.  Occasionally chest pain with them  No syncope.    She drinks 1/2 cup of coffee in the morning.  No excess ETOH    ROS: Denies fever, malais, weight loss, blurry vision, decreased visual acuity, cough, sputum, SOB, hemoptysis, pleuritic pain, palpitaitons, heartburn, abdominal pain, melena, lower extremity edema, claudication, or rash.  All other systems reviewed and negative   General: Affect appropriate Healthy:  appears stated age HEENT: normal Neck supple with no adenopathy JVP normal no bruits no thyromegaly Lungs clear with no wheezing and good diaphragmatic motion Heart:  S1/S2 no murmur,rub, gallop or click PMI normal Abdomen: benighn, BS positve, no tenderness, no AAA no bruit.  No HSM or HJR Distal pulses intact with no bruits No edema Neuro non-focal Skin warm and dry No muscular weakness  Medications Current Outpatient Prescriptions  Medication Sig Dispense Refill  . amLODipine (NORVASC) 5 MG tablet Take 5 mg by mouth daily.      . bisacodyl (BISACODYL) 5 MG EC tablet Take 5 mg by mouth daily as needed. Take 1-4 tablets by mouth daily as needed       . calcium carbonate (OS-CAL) 600 MG TABS Take 600 mg by mouth 2 (two) times daily with a meal.        . chlorpheniramine-HYDROcodone (TUSSIONEX PENNKINETIC ER) 10-8 MG/5ML LQCR  Take 5 mLs by mouth every 12 (twelve) hours as needed.  140 mL  0  . Cholecalciferol (VITAMIN D3) 2000 UNITS TABS Take 1 capsule by mouth as directed.        . clonazePAM (KLONOPIN) 1 MG tablet Take 0.5 tablets (0.5 mg total) by mouth at bedtime as needed.  30 tablet  3  . diclofenac (VOLTAREN) 75 MG EC tablet Take 75 mg by mouth 2 (two) times daily.      Marland Kitchen dicyclomine (BENTYL) 20 MG tablet Take one tablet every 6 hrs as needed for pain  60 tablet  0  . DPH-Lido-AlHydr-MgHydr-Simeth (FIRST-MOUTHWASH BLM) SUSP RINSE AND SWALLOW 15-30 ML'S AS NEEDED  480 mL  0  . esomeprazole (NEXIUM) 40 MG capsule Take 1 capsule (40 mg total) by mouth daily before breakfast.  30 capsule  11  . folic acid (FOLVITE) 800 MCG tablet Take 800 mcg by mouth daily.      . hyoscyamine (LEVSIN SL) 0.125 MG SL tablet Place 0.125 mg under the tongue every 6 (six) hours as needed.      . methotrexate 1 GM/40ML SOLN injection Inject 0.6 mg weekly      . Multiple Vitamin (MULTIVITAMIN PO) Take 1 tablet by mouth daily.        . sucralfate (CARAFATE) 1 GM/10ML suspension Take 10 mLs (1 g total) by mouth 3 (three) times daily between meals.  480 mL  1  . venlafaxine (EFFEXOR) 75 MG tablet Take 1 tablet (75 mg total) by mouth 2 (two) times  daily.  180 tablet  3  . zolpidem (AMBIEN) 10 MG tablet TAKE 1 TABLET BY MOUTH EVERY NIGHT AT BEDTIME AS NEEDED FOR SLEEP  20 tablet  2  . ZOMIG 5 MG tablet ONE TABLET ORALLY AT THE TIME OF HEADACHE. MAY REPEAT X1 IN ONE HOUR IF HEADACHE PERSISTS.  9 tablet  3    Allergies Doxycycline; Morphine sulfate; Penicillins; and Promethazine hcl  Family History: Family History  Problem Relation Age of Onset  . Coronary artery disease Father   . Diabetes      uncle  . Diverticulosis Mother   . Hypertension Mother   . Osteoarthritis Mother   . Colon cancer Neg Hx     Social History: History   Social History  . Marital Status: Married    Spouse Name: N/A    Number of Children: N/A  . Years  of Education: N/A   Occupational History  . RN [employer]   Social History Main Topics  . Smoking status: Never Smoker   . Smokeless tobacco: Never Used  . Alcohol Use: No  . Drug Use: No  . Sexually Active: Not on file   Other Topics Concern  . Not on file   Social History Narrative  . No narrative on file    Electrocardiogram:  02/16/12  NSR rate 98 pulmonary disease pattern RAE  Assessment and Plan

## 2012-03-31 NOTE — Assessment & Plan Note (Signed)
Recurrent  Given frequency will get event monitor

## 2012-04-13 ENCOUNTER — Encounter: Payer: Self-pay | Admitting: Pharmacist

## 2012-04-13 ENCOUNTER — Ambulatory Visit (INDEPENDENT_AMBULATORY_CARE_PROVIDER_SITE_OTHER): Payer: Self-pay | Admitting: Pharmacist

## 2012-04-13 VITALS — Ht 63.0 in | Wt 117.4 lb

## 2012-04-13 DIAGNOSIS — M069 Rheumatoid arthritis, unspecified: Secondary | ICD-10-CM

## 2012-04-13 DIAGNOSIS — N3010 Interstitial cystitis (chronic) without hematuria: Secondary | ICD-10-CM

## 2012-04-13 NOTE — Progress Notes (Signed)
  Subjective:    Patient ID: [name], female    DOB: 04-05-1956, 56 y.o.   MRN: [id number]  HPI Patient arrives in good spirits for medication review.   Reports seeing [name] as primary care provider, Sherrian Divers as rheumatologist, Stan Head as gastroenterologist, Eudelia Bunch as urologist.  Reports being diagnosed with RA since 2010 and states this is currently under an acceptable level of control.      Review of Systems     Objective:   Physical Exam        Assessment & Plan:  Following medication review, no suggestions for change.  Complete medication list provided to patient.  Total time in face to face medication review: 20 minutes.  Patient seen with: Tiney Rouge, PharmD Candidate.

## 2012-04-13 NOTE — Assessment & Plan Note (Signed)
Following medication review, no suggestions for change.  Newly started on Remicade infusion last week and endorses significant improvement in pain control. Complete medication list provided to patient.  Total time in face to face medication review: 20 minutes.  Patient seen with: Tiney Rouge, PharmD Candidate.

## 2012-04-13 NOTE — Addendum Note (Signed)
Addended by: Kathrin Ruddy on: 04/13/2012 04:42 PM   Modules accepted: Orders

## 2012-04-13 NOTE — Patient Instructions (Signed)
Thank you for coming in today for medication review.

## 2012-04-14 ENCOUNTER — Encounter: Payer: Self-pay | Admitting: Cardiovascular Disease

## 2012-04-14 ENCOUNTER — Ambulatory Visit (HOSPITAL_COMMUNITY): Payer: 59 | Attending: Cardiovascular Disease

## 2012-04-14 ENCOUNTER — Ambulatory Visit (HOSPITAL_BASED_OUTPATIENT_CLINIC_OR_DEPARTMENT_OTHER): Payer: 59

## 2012-04-14 DIAGNOSIS — R0989 Other specified symptoms and signs involving the circulatory and respiratory systems: Secondary | ICD-10-CM

## 2012-04-14 DIAGNOSIS — I10 Essential (primary) hypertension: Secondary | ICD-10-CM | POA: Insufficient documentation

## 2012-04-14 DIAGNOSIS — R002 Palpitations: Secondary | ICD-10-CM | POA: Insufficient documentation

## 2012-04-14 DIAGNOSIS — R072 Precordial pain: Secondary | ICD-10-CM | POA: Insufficient documentation

## 2012-04-14 DIAGNOSIS — R079 Chest pain, unspecified: Secondary | ICD-10-CM

## 2012-04-14 NOTE — Progress Notes (Signed)
Echocardiogram performed.  

## 2012-04-14 NOTE — Progress Notes (Signed)
Patient ID: [name], female   DOB: 08/28/1955, 56 y.o.   MRN: [id number] Reviewed and agree with Dr. Macky Lower management and documentation.

## 2012-05-06 ENCOUNTER — Telehealth: Payer: Self-pay | Admitting: *Deleted

## 2012-05-06 NOTE — Telephone Encounter (Signed)
PT AWARE OF MONITOR RESULTS  NSR NO ARRHYTHMIAS PER DR NISHAN./CY

## 2012-05-19 ENCOUNTER — Other Ambulatory Visit: Payer: Self-pay | Admitting: Rheumatology

## 2012-05-19 DIAGNOSIS — M549 Dorsalgia, unspecified: Secondary | ICD-10-CM

## 2012-05-26 ENCOUNTER — Encounter: Payer: Self-pay | Admitting: Cardiovascular Disease

## 2012-05-26 ENCOUNTER — Ambulatory Visit (INDEPENDENT_AMBULATORY_CARE_PROVIDER_SITE_OTHER): Payer: 59 | Admitting: Cardiovascular Disease

## 2012-05-26 VITALS — BP 138/78 | HR 80 | Resp 20 | Ht 63.0 in | Wt 115.0 lb

## 2012-05-26 DIAGNOSIS — F411 Generalized anxiety disorder: Secondary | ICD-10-CM

## 2012-05-26 DIAGNOSIS — R002 Palpitations: Secondary | ICD-10-CM

## 2012-05-26 DIAGNOSIS — I10 Essential (primary) hypertension: Secondary | ICD-10-CM

## 2012-05-26 NOTE — Assessment & Plan Note (Signed)
Stable likely related to palpitaitons

## 2012-05-26 NOTE — Assessment & Plan Note (Signed)
Well controlled.  Continue current medications and low sodium Dash type diet.    

## 2012-05-26 NOTE — Assessment & Plan Note (Signed)
Benign no sig findings on event monitor and normal stress echo.  Continue low dose calcium blocker since has HTN

## 2012-05-26 NOTE — Patient Instructions (Signed)
Your physician wants you to follow-up in: YEAR WITH DR NISHAN  You will receive a reminder letter in the mail two months in advance. If you don't receive a letter, please call our office to schedule the follow-up appointment.  Your physician recommends that you continue on your current medications as directed. Please refer to the Current Medication list given to you today. 

## 2012-05-26 NOTE — Progress Notes (Signed)
Patient ID: [name], female   DOB: 05/09/1956, 56 y.o.   MRN: [id number] 56 year old white female referred for palpitations . Patient's electrolytes, kidney function, thyroid function studies and d-dimer were all normal.  Patient continues to have intermittent palpitations. She reports at least 3-4 episodes per week. They usually occur in the afternoon. They can last anywhere between half hour to 2 hours. She frequently has associated dizziness and nausea. Initially seen 3 months ago for same. Family history of premature CAD No inciting events. S/P hysterectomy and stopped hormones recently but does not think this coincides with palpitations. Occasionally chest pain with them No syncope.  She drinks 1/2 cup of coffee in the morning. No excess ETOH   Normal stress echo 10/13 Event monitor 9/18  No significant findings  ROS: Denies fever, malais, weight loss, blurry vision, decreased visual acuity, cough, sputum, SOB, hemoptysis, pleuritic pain, palpitaitons, heartburn, abdominal pain, melena, lower extremity edema, claudication, or rash.  All other systems reviewed and negative  General: Affect appropriate Healthy:  appears stated age HEENT: normal Neck supple with no adenopathy JVP normal no bruits no thyromegaly Lungs clear with no wheezing and good diaphragmatic motion Heart:  S1/S2 no murmur, no rub, gallop or click PMI normal Abdomen: benighn, BS positve, no tenderness, no AAA no bruit.  No HSM or HJR Distal pulses intact with no bruits No edema Neuro non-focal Skin warm and dry No muscular weakness   Current Outpatient Prescriptions  Medication Sig Dispense Refill  . amLODipine (NORVASC) 5 MG tablet Take 5 mg by mouth daily.      . bisacodyl (BISACODYL) 5 MG EC tablet Take 5 mg by mouth daily as needed. Take 1-4 tablets by mouth daily as needed       . calcium carbonate (OS-CAL) 600 MG TABS Take 600 mg by mouth 2 (two) times daily with a meal.        .  chlorpheniramine-HYDROcodone (TUSSIONEX PENNKINETIC ER) 10-8 MG/5ML LQCR Take 5 mLs by mouth every 12 (twelve) hours as needed.  140 mL  0  . Cholecalciferol (VITAMIN D3) 2000 UNITS TABS Take 1 capsule by mouth as directed.        . clonazePAM (KLONOPIN) 1 MG tablet Take 0.5 tablets (0.5 mg total) by mouth at bedtime as needed.  30 tablet  3  . diclofenac (VOLTAREN) 75 MG EC tablet Take 75 mg by mouth 2 (two) times daily.      Marland Kitchen dicyclomine (BENTYL) 20 MG tablet Take one tablet every 6 hrs as needed for pain  60 tablet  0  . DPH-Lido-AlHydr-MgHydr-Simeth (FIRST-MOUTHWASH BLM) SUSP RINSE AND SWALLOW 15-30 ML'S AS NEEDED  480 mL  0  . esomeprazole (NEXIUM) 40 MG capsule Take 1 capsule (40 mg total) by mouth daily before breakfast.  30 capsule  11  . folic acid (FOLVITE) 800 MCG tablet Take 800 mcg by mouth daily.      . heparin 5000 UNIT/ML SOLN 40,000 Units, lidocaine 2 % SOLN 200 mg, sodium bicarbonate 1 mEq/mL SOLN 5 mEq Irrigate with 1 application as directed daily as needed (bladder spasm).      . hydrOXYzine (ATARAX/VISTARIL) 25 MG tablet Take 1 tablet (25 mg total) by mouth at bedtime.      . hyoscyamine (LEVSIN SL) 0.125 MG SL tablet Place 0.125 mg under the tongue every 6 (six) hours as needed.      . methotrexate 1 GM/40ML SOLN injection Inject 0.6 mg weekly      .  Multiple Vitamin (MULTIVITAMIN PO) Take 1 tablet by mouth daily.        . sodium chloride 0.9 % SOLN 250 mL with inFLIXimab 100 MG SOLR 5 mg/kg Inject 5 mg/kg into the vein every 28 (twenty-eight) days.      . sucralfate (CARAFATE) 1 GM/10ML suspension Take 10 mLs (1 g total) by mouth 3 (three) times daily between meals.  480 mL  1  . venlafaxine (EFFEXOR) 75 MG tablet Take 1 tablet (75 mg total) by mouth 2 (two) times daily.  180 tablet  3  . zolpidem (AMBIEN) 10 MG tablet TAKE 1 TABLET BY MOUTH EVERY NIGHT AT BEDTIME AS NEEDED FOR SLEEP  20 tablet  2  . ZOMIG 5 MG tablet ONE TABLET ORALLY AT THE TIME OF HEADACHE. MAY REPEAT X1  IN ONE HOUR IF HEADACHE PERSISTS.  9 tablet  3    Allergies  Penicillins; Morphine sulfate; Promethazine hcl; and Doxycycline  Electrocardiogram:  Assessment and Plan

## 2012-06-02 ENCOUNTER — Other Ambulatory Visit: Payer: Self-pay | Admitting: Rheumatology

## 2012-06-02 ENCOUNTER — Ambulatory Visit
Admission: RE | Admit: 2012-06-02 | Discharge: 2012-06-02 | Disposition: A | Discharge status: Home / Self Care | Payer: 59 | Source: Ambulatory Visit | Attending: Rheumatology | Admitting: Rheumatology

## 2012-06-02 DIAGNOSIS — M549 Dorsalgia, unspecified: Secondary | ICD-10-CM

## 2012-06-02 MED ORDER — IOHEXOL 180 MG/ML  SOLN
1.0000 mL | Freq: Once | INTRAMUSCULAR | Status: AC | PRN
  Administered 2012-06-02: 1 mL via INTRA_ARTICULAR

## 2012-06-02 MED ORDER — METHYLPREDNISOLONE ACETATE 40 MG/ML INJ SUSP (RADIOLOG
120.0000 mg | Freq: Once | INTRAMUSCULAR | Status: AC
  Administered 2012-06-02: 120 mg via INTRA_ARTICULAR

## 2012-06-07 ENCOUNTER — Other Ambulatory Visit: Payer: Self-pay | Admitting: *Deleted

## 2012-06-07 MED ORDER — ZOLPIDEM TARTRATE 10 MG PO TABS: 10.0000 mg | ORAL_TABLET | Freq: Every evening | ORAL | Status: DC | PRN

## 2012-06-08 ENCOUNTER — Other Ambulatory Visit: Payer: Self-pay | Admitting: Rheumatology

## 2012-06-08 DIAGNOSIS — M549 Dorsalgia, unspecified: Secondary | ICD-10-CM

## 2012-06-13 ENCOUNTER — Emergency Department (HOSPITAL_COMMUNITY)
Admission: EM | Admit: 2012-06-13 | Discharge: 2012-06-13 | Disposition: A | Discharge status: Home / Self Care | Payer: 59 | Attending: Emergency Medicine | Admitting: Emergency Medicine

## 2012-06-13 DIAGNOSIS — F329 Major depressive disorder, single episode, unspecified: Secondary | ICD-10-CM | POA: Insufficient documentation

## 2012-06-13 DIAGNOSIS — F3289 Other specified depressive episodes: Secondary | ICD-10-CM | POA: Insufficient documentation

## 2012-06-13 DIAGNOSIS — F32A Depression, unspecified: Secondary | ICD-10-CM

## 2012-06-13 DIAGNOSIS — M129 Arthropathy, unspecified: Secondary | ICD-10-CM | POA: Insufficient documentation

## 2012-06-13 DIAGNOSIS — K219 Gastro-esophageal reflux disease without esophagitis: Secondary | ICD-10-CM | POA: Insufficient documentation

## 2012-06-13 DIAGNOSIS — I7300 Raynaud's syndrome without gangrene: Secondary | ICD-10-CM | POA: Insufficient documentation

## 2012-06-13 DIAGNOSIS — Z79899 Other long term (current) drug therapy: Secondary | ICD-10-CM | POA: Insufficient documentation

## 2012-06-13 DIAGNOSIS — L930 Discoid lupus erythematosus: Secondary | ICD-10-CM | POA: Insufficient documentation

## 2012-06-13 LAB — COMPREHENSIVE METABOLIC PANEL
ALT: 22 U/L (ref 0–35)
AST: 23 U/L (ref 0–37)
Albumin: 4.6 g/dL (ref 3.5–5.2)
Alkaline Phosphatase: 100 U/L (ref 39–117)
BUN: 13 mg/dL (ref 6–23)
CO2: 29 mEq/L (ref 19–32)
Calcium: 10.1 mg/dL (ref 8.4–10.5)
Chloride: 102 mEq/L (ref 96–112)
Creatinine, Ser: 0.76 mg/dL (ref 0.50–1.10)
GFR calc Af Amer: >90 mL/min (ref >90)
GFR calc non Af Amer: >90 mL/min (ref >90)
Glucose, Bld: 102 mg/dL — ABNORMAL HIGH (ref 70–99)
Potassium: 4.2 mEq/L (ref 3.5–5.1)
Sodium: 140 mEq/L (ref 135–145)
Total Bilirubin: 0.3 mg/dL (ref 0.3–1.2)
Total Protein: 7.7 g/dL (ref 6.0–8.3)

## 2012-06-13 LAB — URINALYSIS, ROUTINE W REFLEX MICROSCOPIC
Bilirubin Urine: NEGATIVE
Glucose, UA: NEGATIVE mg/dL
Hgb urine dipstick: NEGATIVE
Ketones, ur: NEGATIVE mg/dL
Leukocytes, UA: NEGATIVE
Nitrite: NEGATIVE
Protein, ur: NEGATIVE mg/dL
Specific Gravity, Urine: 1.009 (ref 1.005–1.030)
Urobilinogen, UA: 0.2 mg/dL (ref 0.0–1.0)
pH: 5.5 (ref 5.0–8.0)

## 2012-06-13 LAB — CBC WITH DIFFERENTIAL/PLATELET
Basophils Absolute: 0 10*3/uL (ref 0.0–0.1)
Basophils Relative: 0 % (ref 0–1)
Eosinophils Absolute: 0 10*3/uL (ref 0.0–0.7)
Eosinophils Relative: 0 % (ref 0–5)
HCT: 48.3 % — ABNORMAL HIGH (ref 36.0–46.0)
Hemoglobin: 16 g/dL — ABNORMAL HIGH (ref 12.0–15.0)
Lymphocytes Relative: 14 % (ref 12–46)
Lymphs Abs: 1.4 10*3/uL (ref 0.7–4.0)
MCH: 31.1 pg (ref 26.0–34.0)
MCHC: 33.1 g/dL (ref 30.0–36.0)
MCV: 93.8 fL (ref 78.0–100.0)
Monocytes Absolute: 0.9 10*3/uL (ref 0.1–1.0)
Monocytes Relative: 9 % (ref 3–12)
Neutro Abs: 7.4 10*3/uL (ref 1.7–7.7)
Neutrophils Relative %: 76 % (ref 43–77)
Platelets: 242 10*3/uL (ref 150–400)
RBC: 5.15 MIL/uL — ABNORMAL HIGH (ref 3.87–5.11)
RDW: 14.5 % (ref 11.5–15.5)
WBC: 9.7 10*3/uL (ref 4.0–10.5)

## 2012-06-13 LAB — RAPID URINE DRUG SCREEN, HOSP PERFORMED
Amphetamines: NOT DETECTED
Barbiturates: NOT DETECTED
Benzodiazepines: NOT DETECTED
Cocaine: NOT DETECTED
Opiates: NOT DETECTED
Tetrahydrocannabinol: NOT DETECTED

## 2012-06-13 LAB — ACETAMINOPHEN LEVEL: Acetaminophen (Tylenol), Serum: <15 ug/mL (ref 10–30)

## 2012-06-13 LAB — ETHANOL: Alcohol, Ethyl (B): <11 mg/dL (ref 0–11)

## 2012-06-13 LAB — SALICYLATE LEVEL: Salicylate Lvl: <2 mg/dL — ABNORMAL LOW (ref 2.8–20.0)

## 2012-06-13 NOTE — ED Notes (Addendum)
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 This is also the 10th year anniversary of her first husband death by suicide. Pt states she knows she is not eating or drinking enough, is also having hot flashes. Pt very tearful during assessment.

## 2012-06-13 NOTE — ED Provider Notes (Signed)
History     CSN: [id number]  Arrival date & time 06/13/12  1415   First MD Initiated Contact with Patient 06/13/12 1525      Chief Complaint  Patient presents with  . Medical Clearance    (Consider location/radiation/quality/duration/timing/severity/associated sxs/prior treatment) HPI Comments: Patient presents today voluntarily with a chief complaint of depression.   She reports that she has had increased stress at work due to changes in her job description.  She has been increasingly depressed over the past few weeks.  It is also the ten year anniversary of her first husband's death by suicide.  Her PCP started her on Wellbutrin, which she has been taking.  She denies suicidal thoughts.  She states that she would never commit suicide because she knows what it feels like to be left behind by a loved one.  She states that she does have people in her life that are supportive.  She does have a Paramedic.  No psychiatrist at this time.  She denies HI.  Denies drug or alcohol use.    The history is provided by the patient.    Past Medical History  Diagnosis Date  . Depression   . GERD (gastroesophageal reflux disease)   . Anxiety   . Arthritis     Rheumatoid  . IBS (irritable bowel syndrome)     constipation predominant  . Interstitial cystitis   . Lupus   . Hemorrhoids   . Raynaud disease     Past Surgical History  Procedure Date  . Abdominal hysterectomy 2007  . Tonsillectomy   . Excision morton's neuroma   . Colonoscopy 12/24/04    internal and external hemorrhoids  . Flexible sigmoidoscopy 03/07/08    internal and external hemorrhoids  . Upper gastrointestinal endoscopy 10/23/10    gastritis, irregular Z-line    Family History  Problem Relation Age of Onset  . Coronary artery disease Father   . Diabetes      uncle  . Diverticulosis Mother   . Hypertension Mother   . Osteoarthritis Mother   . Colon cancer Neg Hx     History  Substance Use Topics  . Smoking  status: Never Smoker   . Smokeless tobacco: Never Used  . Alcohol Use: No    OB History    Grav Para Term Preterm Abortions TAB SAB Ect Mult Living                  Review of Systems  Constitutional: Negative for fever and chills.  Cardiovascular: Negative for chest pain.  Neurological: Negative for headaches.  Psychiatric/Behavioral: Positive for dysphoric mood. Negative for suicidal ideas, hallucinations, confusion, self-injury and agitation. The patient is nervous/anxious.   All other systems reviewed and are negative.    Allergies  Morphine sulfate; Penicillins; Promethazine hcl; Beef-derived products; Citrus; and Doxycycline  Home Medications   Current Outpatient Rx  Name  Route  Sig  Dispense  Refill  . AMLODIPINE BESYLATE 5 MG PO TABS   Oral   Take 2.5 mg by mouth 2 (two) times daily.          Marland Kitchen BIOTIN 5 MG PO CAPS   Oral   Take 1 capsule by mouth every evening.         Marland Kitchen BUPROPION HCL 75 MG PO TABS   Oral   Take 75 mg by mouth 2 (two) times daily.         Marland Kitchen CALCIUM CARBONATE-VITAMIN D 500-200 MG-UNIT PO TABS  Oral   Take 2 tablets by mouth every evening.         Marland Kitchen VITAMIN D 1000 UNITS PO TABS   Oral   Take 1,000 Units by mouth every evening.         Marland Kitchen CLONAZEPAM 1 MG PO TABS   Oral   Take 0.5 mg by mouth at bedtime as needed. For sleep/ anxiety         . DIAZEPAM 5 MG PO TABS   Oral   Take 5 mg by mouth every 6 (six) hours as needed. For muscle spasms         . DICLOFENAC SODIUM 1 % TD GEL   Topical   Apply 2 g topically 4 (four) times daily as needed. For arthritic pain         . DPH-LIDO-ALHYDR-MGHYDR-SIMETH MT SUSP   Oral   Take 15-30 mLs by mouth 4 (four) times daily as needed. RINSE AND SWALLOW 15-30 ML'S AS NEEDED for canker sores         . ESOMEPRAZOLE MAGNESIUM 40 MG PO CPDR   Oral   Take 1 capsule (40 mg total) by mouth daily before breakfast.   30 capsule   11   . FOLIC ACID 1 MG PO TABS   Oral   Take 1 mg by  mouth every morning.         Marland Kitchen HEPARIN-LIDOCAINE-SOD BICARB BLADDER IRRIGATION SOLUTION   Irrigation   Irrigate with 1 application as directed daily as needed. For interstitial cystitis         . HYDROXYZINE HCL 25 MG PO TABS   Oral   Take 25 mg by mouth daily as needed. For itching         . IBUPROFEN 200 MG PO TABS   Oral   Take 400 mg by mouth every 6 (six) hours as needed. For pain         . METHOTREXATE CHEMO INJECTION 1 GM/40ML   Subcutaneous   Inject 15 mg into the skin once a week. Injects 0.6 ml weekly on mondays         . ADULT MULTIVITAMIN W/MINERALS CH   Oral   Take 1 tablet by mouth every evening.         . INFLIXIMAB INFUSION   Intravenous   Inject 5 mg/kg into the vein every 28 (twenty-eight) days. Sherrian Divers rheumatologist  medical [facility] is remicade is received         . SUCRALFATE 1 GM/10ML PO SUSP   Oral   Take 1 g by mouth 3 (three) times daily as needed. For digestion         . VENLAFAXINE HCL 75 MG PO TABS   Oral   Take 1 tablet (75 mg total) by mouth 2 (two) times daily.   180 tablet   3   . ZOLPIDEM TARTRATE 10 MG PO TABS   Oral   Take 1 tablet (10 mg total) by mouth at bedtime as needed for sleep.   30 tablet   1   . DICYCLOMINE HCL 20 MG PO TABS   Oral   Take 20 mg by mouth every 6 (six) hours as needed. For cramping and pain           BP 155/102  Pulse 100  Temp 98.1 F (36.7 C) (Oral)  Resp 18  Physical Exam  Nursing note and vitals reviewed. Constitutional: She appears well-developed and well-nourished.  HENT:  Head: Normocephalic and atraumatic.  Mouth/Throat: Oropharynx is clear and moist.  Neck: Normal range of motion. Neck supple.  Cardiovascular: Normal rate, regular rhythm and normal heart sounds.   Pulmonary/Chest: Effort normal and breath sounds normal.  Neurological: She is alert.  Skin: Skin is warm and dry.  Psychiatric: Her speech is normal. Judgment normal. She is  withdrawn. She is not actively hallucinating. Thought content is not paranoid and not delusional. Cognition and memory are normal. She exhibits a depressed mood. She expresses no homicidal and no suicidal ideation. She expresses no suicidal plans and no homicidal plans.    ED Course  Procedures (including critical care time)   Labs Reviewed  URINALYSIS, ROUTINE W REFLEX MICROSCOPIC  URINE RAPID DRUG SCREEN (HOSP PERFORMED)  CBC WITH DIFFERENTIAL  COMPREHENSIVE METABOLIC PANEL  ETHANOL  SALICYLATE LEVEL  ACETAMINOPHEN LEVEL   No results found.   No diagnosis found.    MDM  Patient presents with depression.  She denies SI.  She denies HI.  No alcohol or drug abuse.  She has a support system.  Patient given resources by ACT team and discharged home.  Strict return precautions given to the patient.        Pascal Lux Dumas, PA-C 06/14/12 1209

## 2012-06-14 ENCOUNTER — Encounter: Payer: Self-pay | Admitting: Family Medicine

## 2012-06-14 ENCOUNTER — Ambulatory Visit (INDEPENDENT_AMBULATORY_CARE_PROVIDER_SITE_OTHER): Payer: 59 | Admitting: Family Medicine

## 2012-06-14 VITALS — BP 132/90 | HR 100 | Temp 98.7°F | Wt 115.0 lb

## 2012-06-14 DIAGNOSIS — I10 Essential (primary) hypertension: Secondary | ICD-10-CM

## 2012-06-14 DIAGNOSIS — F329 Major depressive disorder, single episode, unspecified: Secondary | ICD-10-CM

## 2012-06-14 DIAGNOSIS — F32A Depression, unspecified: Secondary | ICD-10-CM

## 2012-06-14 NOTE — Progress Notes (Signed)
Chief Complaint  Patient presents with  . Hypertension    was 150/100 on yesterday     HPI:  Acute visit for HTN/Depression: -of note had ED visit yesterday due to recent stress at work and was crying and nervous breakdown had issues with depression -patient feels symptoms are all related to depression and stress - no suicidal or homicidal thoughts, but just feels like "crawling in a corner"  -blood pressure was up in ED (top number in 150s) and she is worried about this and wanted to talk about this -she has fuzziness in head, depression, stress, tearful frequently -late husband committed suicide -seeing psych on Wednesday -seeing rheumatology and has had issues with this medication - hot flashes - going to stop this - has appt with rheum to discuss -takes amlodipine for raynauds    ROS: See pertinent positives and negatives per HPI.  Past Medical History  Diagnosis Date  . Depression   . GERD (gastroesophageal reflux disease)   . Anxiety   . Arthritis     Rheumatoid  . IBS (irritable bowel syndrome)     constipation predominant  . Interstitial cystitis   . Lupus   . Hemorrhoids   . Raynaud disease     Family History  Problem Relation Age of Onset  . Coronary artery disease Father   . Diabetes      uncle  . Diverticulosis Mother   . Hypertension Mother   . Osteoarthritis Mother   . Colon cancer Neg Hx     History   Social History  . Marital Status: Married    Spouse Name: N/A    Number of Children: N/A  . Years of Education: N/A   Occupational History  . RN [employer]   Social History Main Topics  . Smoking status: Never Smoker   . Smokeless tobacco: Never Used  . Alcohol Use: No  . Drug Use: No  . Sexually Active: None   Other Topics Concern  . None   Social History Narrative  . None    Current outpatient prescriptions:amLODipine (NORVASC) 5 MG tablet, Take 2.5 mg by mouth 2 (two) times daily. , Disp: , Rfl: ;  Biotin (BIOTIN 5000) 5 MG  CAPS, Take 1 capsule by mouth every evening., Disp: , Rfl: ;  buPROPion (WELLBUTRIN) 75 MG tablet, Take 75 mg by mouth 2 (two) times daily., Disp: , Rfl: ;  calcium-vitamin D (OSCAL WITH D) 500-200 MG-UNIT per tablet, Take 2 tablets by mouth every evening., Disp: , Rfl:  cholecalciferol (VITAMIN D) 1000 UNITS tablet, Take 1,000 Units by mouth every evening., Disp: , Rfl: ;  clonazePAM (KLONOPIN) 1 MG tablet, Take 0.5 mg by mouth at bedtime as needed. For sleep/ anxiety, Disp: , Rfl: ;  diazepam (VALIUM) 5 MG tablet, Take 5 mg by mouth every 6 (six) hours as needed. For muscle spasms, Disp: , Rfl:  diclofenac sodium (VOLTAREN) 1 % GEL, Apply 2 g topically 4 (four) times daily as needed. For arthritic pain, Disp: , Rfl: ;  dicyclomine (BENTYL) 20 MG tablet, Take 20 mg by mouth every 6 (six) hours as needed. For cramping and pain, Disp: , Rfl: ;  DPH-Lido-AlHydr-MgHydr-Simeth SUSP, Take 15-30 mLs by mouth 4 (four) times daily as needed. RINSE AND SWALLOW 15-30 ML'S AS NEEDED for canker sores, Disp: , Rfl:  esomeprazole (NEXIUM) 40 MG capsule, Take 1 capsule (40 mg total) by mouth daily before breakfast., Disp: 30 capsule, Rfl: 11;  folic acid (FOLVITE) 1 MG  tablet, Take 1 mg by mouth every morning., Disp: , Rfl: ;  heparin 5000 UNIT/ML SOLN 40,000 Units, lidocaine 2 % SOLN 200 mg, sodium bicarbonate 1 mEq/mL SOLN 5 mEq, Irrigate with 1 application as directed daily as needed. For interstitial cystitis, Disp: , Rfl:  hydrOXYzine (ATARAX/VISTARIL) 25 MG tablet, Take 25 mg by mouth daily as needed. For itching, Disp: , Rfl: ;  ibuprofen (ADVIL,MOTRIN) 200 MG tablet, Take 400 mg by mouth every 6 (six) hours as needed. For pain, Disp: , Rfl: ;  methotrexate 1 GM/40ML SOLN injection, Inject 15 mg into the skin once a week. Injects 0.6 ml weekly on mondays, Disp: , Rfl:  Multiple Vitamin (MULTIVITAMIN WITH MINERALS) TABS, Take 1 tablet by mouth every evening., Disp: , Rfl: ;  sodium chloride 0.9 % SOLN 250 mL with  inFLIXimab 100 MG SOLR 5 mg/kg, Inject 5 mg/kg into the vein every 28 (twenty-eight) days. Sherrian Divers rheumatologist  medical [facility] [place] is remicade is received, Disp: , Rfl:  sucralfate (CARAFATE) 1 GM/10ML suspension, Take 1 g by mouth 3 (three) times daily as needed. For digestion, Disp: , Rfl: ;  venlafaxine (EFFEXOR) 75 MG tablet, Take 1 tablet (75 mg total) by mouth 2 (two) times daily., Disp: 180 tablet, Rfl: 3;  zolpidem (AMBIEN) 10 MG tablet, Take 1 tablet (10 mg total) by mouth at bedtime as needed for sleep., Disp: 30 tablet, Rfl: 1  EXAM:  Filed Vitals:   06/14/12 1549  BP: 132/90  Pulse:   Temp:     There is no height on file to calculate BMI.  GENERAL: vitals reviewed and listed above, alert, oriented, appears well hydrated and in no acute distress  HEENT: atraumatic, conjunttiva clear, no obvious abnormalities on inspection of external nose and ears  NECK: no obvious masses on inspection  LUNGS: clear to auscultation bilaterally, no wheezes, rales or rhonchi, good air movement  CV: HRRR, no peripheral edema  MS: moves all extremities without noticeable abnormality  PSYCH: pleasant and cooperative, depressed mood, tearful  ASSESSMENT AND PLAN:  Discussed the following assessment and plan:  1. Depression   2. Hypertension    -SBP/diastolic improved on recheck -more concerning is her depression/anxiety - seeing psychiatrist on wed - but she would like to see counselor tomorrow if possible. No thoughts of self harm. She will let a doctor know immediately if develops any thoughts of self harm -info provided for counselors and set up appointment for tomorrow -she will see psych wed -for HTN - she will check BP randomly (when not having nervous breakdown) a few times per week and follow up with PCP in 1 month with log -Patient advised to return or notify a doctor immediately if symptoms worsen or persist or new concerns arise.  There are no  Patient Instructions on file for this visit.   Kriste Basque R.

## 2012-06-15 ENCOUNTER — Ambulatory Visit (INDEPENDENT_AMBULATORY_CARE_PROVIDER_SITE_OTHER): Payer: 59 | Admitting: Licensed Clinical Social Worker

## 2012-06-15 DIAGNOSIS — F331 Major depressive disorder, recurrent, moderate: Secondary | ICD-10-CM

## 2012-06-15 DIAGNOSIS — F411 Generalized anxiety disorder: Secondary | ICD-10-CM

## 2012-06-15 NOTE — ED Provider Notes (Signed)
Medical screening examination/treatment/procedure(s) were performed by non-physician practitioner and as supervising physician I was immediately available for consultation/collaboration.  Kyri Shader, MD 06/15/12 1456 

## 2012-06-22 ENCOUNTER — Other Ambulatory Visit: Payer: Self-pay | Admitting: Internal Medicine

## 2012-06-23 ENCOUNTER — Ambulatory Visit (INDEPENDENT_AMBULATORY_CARE_PROVIDER_SITE_OTHER): Payer: 59 | Admitting: Licensed Clinical Social Worker

## 2012-06-23 DIAGNOSIS — F411 Generalized anxiety disorder: Secondary | ICD-10-CM

## 2012-06-23 DIAGNOSIS — F331 Major depressive disorder, recurrent, moderate: Secondary | ICD-10-CM

## 2012-06-24 ENCOUNTER — Other Ambulatory Visit: Payer: Self-pay | Admitting: Internal Medicine

## 2012-06-24 NOTE — Telephone Encounter (Signed)
Would you like for me to refill this Sir.

## 2012-06-25 NOTE — Telephone Encounter (Signed)
Please do with 2 refills

## 2012-07-16 ENCOUNTER — Ambulatory Visit: Payer: 59 | Admitting: Licensed Clinical Social Worker

## 2012-07-27 ENCOUNTER — Other Ambulatory Visit: Payer: Self-pay | Admitting: Internal Medicine

## 2012-07-28 ENCOUNTER — Ambulatory Visit (INDEPENDENT_AMBULATORY_CARE_PROVIDER_SITE_OTHER): Payer: 59 | Admitting: Licensed Clinical Social Worker

## 2012-07-28 DIAGNOSIS — F411 Generalized anxiety disorder: Secondary | ICD-10-CM

## 2012-07-28 DIAGNOSIS — F331 Major depressive disorder, recurrent, moderate: Secondary | ICD-10-CM

## 2012-08-11 ENCOUNTER — Ambulatory Visit: Payer: 59 | Admitting: Licensed Clinical Social Worker

## 2012-08-13 ENCOUNTER — Other Ambulatory Visit: Payer: Self-pay | Admitting: Obstetrics & Gynecology

## 2012-08-13 DIAGNOSIS — Z1231 Encounter for screening mammogram for malignant neoplasm of breast: Secondary | ICD-10-CM

## 2012-09-08 ENCOUNTER — Ambulatory Visit
Admission: RE | Admit: 2012-09-08 | Discharge: 2012-09-08 | Disposition: A | Discharge status: Home / Self Care | Payer: 59 | Source: Ambulatory Visit | Attending: Obstetrics & Gynecology | Admitting: Obstetrics & Gynecology

## 2012-09-08 DIAGNOSIS — Z1231 Encounter for screening mammogram for malignant neoplasm of breast: Secondary | ICD-10-CM

## 2012-09-27 ENCOUNTER — Ambulatory Visit (INDEPENDENT_AMBULATORY_CARE_PROVIDER_SITE_OTHER): Payer: 59 | Admitting: Family Medicine

## 2012-09-27 ENCOUNTER — Encounter: Payer: Self-pay | Admitting: Family Medicine

## 2012-09-27 VITALS — BP 104/70 | HR 110 | Temp 99.0°F | Wt 113.0 lb

## 2012-09-27 DIAGNOSIS — J329 Chronic sinusitis, unspecified: Secondary | ICD-10-CM

## 2012-09-27 DIAGNOSIS — R109 Unspecified abdominal pain: Secondary | ICD-10-CM

## 2012-09-27 DIAGNOSIS — R300 Dysuria: Secondary | ICD-10-CM

## 2012-09-27 LAB — POCT URINALYSIS DIPSTICK
Blood, UA: NEGATIVE
Glucose, UA: NEGATIVE
Ketones, UA: NEGATIVE
Leukocytes, UA: NEGATIVE
Nitrite, UA: NEGATIVE
Spec Grav, UA: 1.025
Urobilinogen, UA: 0.2
pH, UA: 6

## 2012-09-27 MED ORDER — AZITHROMYCIN 250 MG PO TABS
ORAL_TABLET | ORAL | Status: DC
Start: 2012-09-27 — End: 2012-10-21

## 2012-09-27 MED ORDER — HYDROCODONE-HOMATROPINE 5-1.5 MG/5ML PO SYRP: 5.0000 mL | ORAL_SOLUTION | Freq: Three times a day (TID) | ORAL | Status: DC | PRN

## 2012-09-27 NOTE — Progress Notes (Signed)
Chief Complaint  Patient presents with  . Fever    body aches, sweating, cough, chills     HPI:  Cough, congestion, fever: -started: 3 days -symptoms:nasal congestion, cough, fevers - up to 102 a few days ago, intermittent max sinus pain, body aches, dysuria, mild OB -denies:fever, SOB, NVD, tooth pain, abd pain, flank pain - has body aches all over -has tried: tylenol, ibuprofen -sick contacts: stomach bug at work, no known flu exposure, had flu vaccine -Hx of: denies lupus, no chronic lung disease - thought to have RA - on remicaid  ROS: See pertinent positives and negatives per HPI.  Past Medical History  Diagnosis Date  . Depression   . GERD (gastroesophageal reflux disease)   . Anxiety   . Arthritis     Rheumatoid  . IBS (irritable bowel syndrome)     constipation predominant  . Interstitial cystitis   . Lupus   . Hemorrhoids   . Raynaud disease     Family History  Problem Relation Age of Onset  . Coronary artery disease Father   . Diabetes      uncle  . Diverticulosis Mother   . Hypertension Mother   . Osteoarthritis Mother   . Colon cancer Neg Hx     History   Social History  . Marital Status: Married    Spouse Name: N/A    Number of Children: N/A  . Years of Education: N/A   Occupational History  . RN [employer]   Social History Main Topics  . Smoking status: Never Smoker   . Smokeless tobacco: Never Used  . Alcohol Use: No  . Drug Use: No  . Sexually Active: None   Other Topics Concern  . None   Social History Narrative  . None    Current outpatient prescriptions:amLODipine (NORVASC) 5 MG tablet, Take 2.5 mg by mouth 2 (two) times daily. , Disp: , Rfl: ;  Biotin (BIOTIN 5000) 5 MG CAPS, Take 1 capsule by mouth every evening., Disp: , Rfl: ;  buPROPion (WELLBUTRIN) 75 MG tablet, Take 75 mg by mouth 2 (two) times daily., Disp: , Rfl: ;  calcium-vitamin D (OSCAL WITH D) 500-200 MG-UNIT per tablet, Take 2 tablets by mouth every evening.,  Disp: , Rfl:  cholecalciferol (VITAMIN D) 1000 UNITS tablet, Take 1,000 Units by mouth every evening., Disp: , Rfl: ;  clonazePAM (KLONOPIN) 1 MG tablet, Take 0.5 mg by mouth at bedtime as needed. For sleep/ anxiety, Disp: , Rfl: ;  diazepam (VALIUM) 5 MG tablet, Take 5 mg by mouth every 6 (six) hours as needed. For muscle spasms, Disp: , Rfl:  diclofenac sodium (VOLTAREN) 1 % GEL, Apply 2 g topically 4 (four) times daily as needed. For arthritic pain, Disp: , Rfl: ;  dicyclomine (BENTYL) 20 MG tablet, Take 20 mg by mouth every 6 (six) hours as needed. For cramping and pain, Disp: , Rfl: ;  DPH-Lido-AlHydr-MgHydr-Simeth (FIRST-MOUTHWASH BLM) SUSP, RINSE AND SWALLOW 15-30 MLS BY MOUTH AS NEEDED, Disp: 474 mL, Rfl: 2 folic acid (FOLVITE) 1 MG tablet, Take 1 mg by mouth every morning., Disp: , Rfl: ;  heparin 5000 UNIT/ML SOLN 40,000 Units, lidocaine 2 % SOLN 200 mg, sodium bicarbonate 1 mEq/mL SOLN 5 mEq, Irrigate with 1 application as directed daily as needed. For interstitial cystitis, Disp: , Rfl: ;  hydrOXYzine (ATARAX/VISTARIL) 25 MG tablet, Take 25 mg by mouth daily as needed. For itching, Disp: , Rfl:  ibuprofen (ADVIL,MOTRIN) 200 MG tablet, Take 400  mg by mouth every 6 (six) hours as needed. For pain, Disp: , Rfl: ;  methotrexate 1 GM/40ML SOLN injection, Inject 15 mg into the skin once a week. Injects 0.6 ml weekly on mondays, Disp: , Rfl: ;  Multiple Vitamin (MULTIVITAMIN WITH MINERALS) TABS, Take 1 tablet by mouth every evening., Disp: , Rfl:  NEXIUM 40 MG capsule, TAKE 1 CAPSULE (40 MG TOTAL) BY MOUTH DAILY BEFORE BREAKFAST., Disp: 30 capsule, Rfl: 11;  sodium chloride 0.9 % SOLN 250 mL with inFLIXimab 100 MG SOLR 5 mg/kg, Inject 5 mg/kg into the vein every 28 (twenty-eight) days. Sherrian Divers rheumatologist  medical [facility] [place] is remicade is received, Disp: , Rfl:  sucralfate (CARAFATE) 1 GM/10ML suspension, Take 1 g by mouth 3 (three) times daily as needed. For digestion, Disp:  , Rfl: ;  venlafaxine (EFFEXOR) 75 MG tablet, Take 1 tablet (75 mg total) by mouth 2 (two) times daily., Disp: 180 tablet, Rfl: 3;  zolpidem (AMBIEN) 10 MG tablet, Take 1 tablet (10 mg total) by mouth at bedtime as needed for sleep., Disp: 30 tablet, Rfl: 1 ZOMIG 5 MG tablet, TAKE 1 TABLET BY MOUTH AT THE TIME OF HEADACHE, MAY REPEAT IN ONE HOUR IF HEADACHE PERSISTS, Disp: 9 tablet, Rfl: 3;  azithromycin (ZITHROMAX) 250 MG tablet, 2 tabs on the first day, 1 tab daily for 4 days, Disp: 6 tablet, Rfl: 0;  HYDROcodone-homatropine (HYCODAN) 5-1.5 MG/5ML syrup, Take 5 mLs by mouth every 8 (eight) hours as needed for cough., Disp: 120 mL, Rfl: 0  EXAM:  Filed Vitals:   09/27/12 1111  BP: 104/70  Pulse: 110  Temp: 99 F (37.2 C)    Body mass index is 20.02 kg/(m^2).  GENERAL: vitals reviewed and listed above, alert, oriented, appears well hydrated and in no acute distress  HEENT: atraumatic, conjunttiva clear, no obvious abnormalities on inspection of external nose and ears, normal appearance of ear canals and TMs, clear nasal congestion, mild post oropharyngeal erythema with PND, no tonsillar edema or exudate, no sinus TTP  NECK: no obvious masses on inspection  LUNGS: clear to auscultation bilaterally, no wheezes, rales or rhonchi, good air movement  CV: HRRR, no peripheral edema  ABD: soft, NTTP, np CVA TTP  MS: moves all extremities without noticeable abnormality  PSYCH: pleasant and cooperative, no obvious depression or anxiety  ASSESSMENT AND PLAN:  Discussed the following assessment and plan:  Sinusitis - Plan: azithromycin (ZITHROMAX) 250 MG tablet, HYDROcodone-homatropine (HYCODAN) 5-1.5 MG/5ML syrup  Flank pain - Plan: POCT urinalysis dipstick  Dysuria - Plan: Culture, Urine  -likely viral, lungs clear, but with fevers and sinus pain with tx with zpack as this is what she reports she could take ok in the past for similar symptoms - risks discussed. Cough med given - risks  discussed. Return precautions discussed. Advised plenty of fluids as mildly dehydrated - reports ha snot been drinking well. Urine culture pending. CNA put in flank pain for urine dip association and can not be removed but pt denies flank pain and has no CVA TTP. -Needs folllow up with PCP has not seen in some time with MMP -Patient advised to return or notify a doctor immediately if symptoms worsen or persist or new concerns arise.  There are no Patient Instructions on file for this visit.   Kriste Basque R.

## 2012-09-28 LAB — URINE CULTURE
Colony Count: NO GROWTH
Organism ID, Bacteria: NO GROWTH

## 2012-09-29 ENCOUNTER — Telehealth: Payer: Self-pay | Admitting: Family Medicine

## 2012-09-29 NOTE — Telephone Encounter (Signed)
Called and spoke with pt and she states she still has fatigue.  Pt is aware of lab results.

## 2012-09-29 NOTE — Telephone Encounter (Signed)
Left a message for return call.  

## 2012-09-29 NOTE — Telephone Encounter (Signed)
Urine culture normal.

## 2012-10-07 ENCOUNTER — Telehealth: Payer: Self-pay | Admitting: Internal Medicine

## 2012-10-07 NOTE — Telephone Encounter (Signed)
Patient Information:  Caller Name: Malani  Phone: [phone]  Patient: [name], [name]  Gender: Female  DOB: 15-Aug-1955  Age: 57 Years  PCP: Kriste Basque [facility])  Office Follow Up:  Does the office need to follow up with this patient?: Yes  Instructions For The Office: Patient requests an inhaler -if approved her drug store is [facility].  RN Note:  Patient was seen a week ago with flu-cold like symptoms and was put on an antibiotic and cough medicine.  She calls this morning afebrile and better in some ways but the night cough.  She wakes up wheezing.  She is requesting and inhaler.  Feels like she has gone into bronchitis and that an inhaler would make a difference.  No emergent symptoms assessed.  Symptoms  Reason For Call & Symptoms: Coughing at night and wakes wheezing  Reviewed Health History In EMR: Yes  Reviewed Medications In EMR: Yes  Reviewed Allergies In EMR: Yes  Reviewed Surgeries / Procedures: Yes  Date of Onset of Symptoms: 10/05/2012  Treatments Tried: Various over the counter products  Treatments Tried Worked: No  Guideline(s) Used:  Cough  Disposition Per Guideline:   Home Care  Reason For Disposition Reached:   Cough with cold symptoms (e.g., runny nose, postnasal drip, throat clearing)  Advice Given:  Call Back If:  You become worse.  RN Overrode Recommendation:  Patient Requests Prescription  Requesting an inhaler

## 2012-10-08 ENCOUNTER — Telehealth: Payer: Self-pay | Admitting: Internal Medicine

## 2012-10-08 MED ORDER — ALBUTEROL SULFATE HFA 108 (90 BASE) MCG/ACT IN AERS: 2.0000 | INHALATION_SPRAY | Freq: Four times a day (QID) | RESPIRATORY_TRACT | Status: DC | PRN

## 2012-10-08 NOTE — Telephone Encounter (Signed)
rx sent in electronically 

## 2012-10-08 NOTE — Telephone Encounter (Signed)
Albuterol mdi 2 puffs q 6 hours prn. #1/0 inhalers

## 2012-10-08 NOTE — Telephone Encounter (Signed)
I dont see this on med list

## 2012-10-08 NOTE — Telephone Encounter (Signed)
Pt is calling back regarding inhaler.  Pt had called yesterday (10/07/12) to request an inhaler.  Pt denied any difficulty breathing but does report a slight wheeze at night.  Pt states she has had this before after an illness of bronchitis and the inhaler helped her symptoms.  Pt uses [facility] [employer].  OFFICE PLEASE FOLLOW UP WITH PATIENT.  (sent high importance due to second call by pt)

## 2012-10-13 ENCOUNTER — Encounter: Payer: Self-pay | Admitting: Family Medicine

## 2012-10-13 ENCOUNTER — Ambulatory Visit (INDEPENDENT_AMBULATORY_CARE_PROVIDER_SITE_OTHER): Payer: 59 | Admitting: Family Medicine

## 2012-10-13 DIAGNOSIS — L7211 Pilar cyst: Secondary | ICD-10-CM

## 2012-10-13 DIAGNOSIS — J309 Allergic rhinitis, unspecified: Secondary | ICD-10-CM

## 2012-10-13 MED ORDER — FLUTICASONE PROPIONATE 50 MCG/ACT NA SUSP: 2.0000 | Freq: Every day | NASAL | Status: DC

## 2012-10-13 NOTE — Patient Instructions (Addendum)
-  With Flonase: please use 2 sprays each nostril for the first month then one spray each nostril  -schedule follow up with your doctor

## 2012-10-13 NOTE — Progress Notes (Signed)
Chief Complaint  Patient presents with  . knot on back of head    swollen, tdner x 6 weeks     HPI:  57 yo pt of Dr. Cato Mulligan seen for acute visit for "knot on scalp:" -started about 6 weeks ago - was swollen and sore a few weeks ago - now smaller -had another bump on scalp similar at one point -no fevers, chills, drainage, loss of hair  Also having flare of seasonal allergies: -reports tested in the past and has allergies -takes zyrtec which helps -in the spring has persistent nasal dripping, nasal itchiness, sneezing -wants to try nasal spray  ROS: See pertinent positives and negatives per HPI.  Past Medical History  Diagnosis Date  . Depression   . GERD (gastroesophageal reflux disease)   . Anxiety   . Arthritis     Rheumatoid  . IBS (irritable bowel syndrome)     constipation predominant  . Interstitial cystitis   . Lupus   . Hemorrhoids   . Raynaud disease     Family History  Problem Relation Age of Onset  . Coronary artery disease Father   . Diabetes      uncle  . Diverticulosis Mother   . Hypertension Mother   . Osteoarthritis Mother   . Colon cancer Neg Hx     History   Social History  . Marital Status: Married    Spouse Name: N/A    Number of Children: N/A  . Years of Education: N/A   Occupational History  . RN [employer]   Social History Main Topics  . Smoking status: Never Smoker   . Smokeless tobacco: Never Used  . Alcohol Use: No  . Drug Use: No  . Sexually Active: None   Other Topics Concern  . None   Social History Narrative  . None    Current outpatient prescriptions:albuterol (PROVENTIL HFA;VENTOLIN HFA) 108 (90 BASE) MCG/ACT inhaler, Inhale 2 puffs into the lungs every 6 (six) hours as needed for wheezing., Disp: 1 Inhaler, Rfl: 0;  amLODipine (NORVASC) 5 MG tablet, Take 2.5 mg by mouth 2 (two) times daily. , Disp: , Rfl: ;  azithromycin (ZITHROMAX) 250 MG tablet, 2 tabs on the first day, 1 tab daily for 4 days, Disp: 6 tablet,  Rfl: 0 Biotin (BIOTIN 5000) 5 MG CAPS, Take 1 capsule by mouth every evening., Disp: , Rfl: ;  buPROPion (WELLBUTRIN) 75 MG tablet, Take 75 mg by mouth 2 (two) times daily., Disp: , Rfl: ;  calcium-vitamin D (OSCAL WITH D) 500-200 MG-UNIT per tablet, Take 2 tablets by mouth every evening., Disp: , Rfl: ;  cholecalciferol (VITAMIN D) 1000 UNITS tablet, Take 1,000 Units by mouth every evening., Disp: , Rfl:  clonazePAM (KLONOPIN) 1 MG tablet, Take 0.5 mg by mouth at bedtime as needed. For sleep/ anxiety, Disp: , Rfl: ;  diazepam (VALIUM) 5 MG tablet, Take 5 mg by mouth every 6 (six) hours as needed. For muscle spasms, Disp: , Rfl: ;  diclofenac sodium (VOLTAREN) 1 % GEL, Apply 2 g topically 4 (four) times daily as needed. For arthritic pain, Disp: , Rfl:  dicyclomine (BENTYL) 20 MG tablet, Take 20 mg by mouth every 6 (six) hours as needed. For cramping and pain, Disp: , Rfl: ;  DPH-Lido-AlHydr-MgHydr-Simeth (FIRST-MOUTHWASH BLM) SUSP, RINSE AND SWALLOW 15-30 MLS BY MOUTH AS NEEDED, Disp: 474 mL, Rfl: 2;  folic acid (FOLVITE) 1 MG tablet, Take 1 mg by mouth every morning., Disp: , Rfl:  heparin  5000 UNIT/ML SOLN 40,000 Units, lidocaine 2 % SOLN 200 mg, sodium bicarbonate 1 mEq/mL SOLN 5 mEq, Irrigate with 1 application as directed daily as needed. For interstitial cystitis, Disp: , Rfl: ;  HYDROcodone-homatropine (HYCODAN) 5-1.5 MG/5ML syrup, Take 5 mLs by mouth every 8 (eight) hours as needed for cough., Disp: 120 mL, Rfl: 0 hydrOXYzine (ATARAX/VISTARIL) 25 MG tablet, Take 25 mg by mouth daily as needed. For itching, Disp: , Rfl: ;  ibuprofen (ADVIL,MOTRIN) 200 MG tablet, Take 400 mg by mouth every 6 (six) hours as needed. For pain, Disp: , Rfl: ;  methotrexate 1 GM/40ML SOLN injection, Inject 15 mg into the skin once a week. Injects 0.6 ml weekly on mondays, Disp: , Rfl:  Multiple Vitamin (MULTIVITAMIN WITH MINERALS) TABS, Take 1 tablet by mouth every evening., Disp: , Rfl: ;  NEXIUM 40 MG capsule, TAKE 1  CAPSULE (40 MG TOTAL) BY MOUTH DAILY BEFORE BREAKFAST., Disp: 30 capsule, Rfl: 11 sodium chloride 0.9 % SOLN 250 mL with inFLIXimab 100 MG SOLR 5 mg/kg, Inject 5 mg/kg into the vein every 28 (twenty-eight) days. Sherrian Divers rheumatologist  medical [facility] [place] is remicade is received, Disp: , Rfl: ;  sucralfate (CARAFATE) 1 GM/10ML suspension, Take 1 g by mouth 3 (three) times daily as needed. For digestion, Disp: , Rfl:  venlafaxine (EFFEXOR) 75 MG tablet, Take 1 tablet (75 mg total) by mouth 2 (two) times daily., Disp: 180 tablet, Rfl: 3;  zolpidem (AMBIEN) 10 MG tablet, Take 1 tablet (10 mg total) by mouth at bedtime as needed for sleep., Disp: 30 tablet, Rfl: 1;  ZOMIG 5 MG tablet, TAKE 1 TABLET BY MOUTH AT THE TIME OF HEADACHE, MAY REPEAT IN ONE HOUR IF HEADACHE PERSISTS, Disp: 9 tablet, Rfl: 3 fluticasone (FLONASE) 50 MCG/ACT nasal spray, Place 2 sprays into the nose daily., Disp: 16 g, Rfl: 2  EXAM:  Filed Vitals:   10/13/12 1006  BP: 110/70  Pulse: 80  Temp: 98.7 F (37.1 C)    Body mass index is 20.55 kg/(m^2).  GENERAL: vitals reviewed and listed above, alert, oriented, appears well hydrated and in no acute distress  HEENT: small 1.5 cm pilar mobile subcut cyst R occipital region with minimal TTP , atraumatic, conjunttiva clear, no obvious abnormalities on inspection of external nose and ears, normal appearance of ear canals and TMs, clear nasal congestion with pale boggy turbinates, mild post oropharyngeal erythema with PND and cobblestoning, no tonsillar edema or exudate, no sinus TTP  NECK: no obvious masses on inspection  LUNGS: clear to auscultation bilaterally, no wheezes, rales or rhonchi, good air movement  CV: HRRR, no peripheral edema  MS: moves all extremities without noticeable abnormality  PSYCH: pleasant and cooperative, no obvious depression or anxiety  ASSESSMENT AND PLAN:  Discussed the following assessment and plan:  Pilar  cyst  Allergic rhinitis - Plan: fluticasone (FLONASE) 50 MCG/ACT nasal spray  -fot cyst appear to have resolving inflamed pilar cyst - discussed this is usually not infectious, is usuallly inflammatory and usullay resolves with compressed. Discussed options for removal and she will follow up with PCP once inflammation has resolved completely and decide if once to have removed. -for allergies will add flonase - Rx sent, risks discussed -have advised routine follow up with her PCP on several occasions and pt has not scheduled this - reiterated that she need f/u for chronic disease with her PCP. -Patient advised to return or notify a doctor immediately if symptoms worsen or persist  or new concerns arise.  Patient Instructions  -With Flonase: please use 2 sprays each nostril for the first month then one spray each nostril  -schedule follow up with your doctor     Terressa Koyanagi.

## 2012-10-21 ENCOUNTER — Telehealth: Payer: Self-pay | Admitting: Internal Medicine

## 2012-10-21 ENCOUNTER — Encounter: Payer: Self-pay | Admitting: Internal Medicine

## 2012-10-21 ENCOUNTER — Ambulatory Visit (INDEPENDENT_AMBULATORY_CARE_PROVIDER_SITE_OTHER)
Admission: RE | Admit: 2012-10-21 | Discharge: 2012-10-21 | Disposition: A | Discharge status: Home / Self Care | Payer: 59 | Source: Ambulatory Visit | Attending: Internal Medicine | Admitting: Internal Medicine

## 2012-10-21 ENCOUNTER — Ambulatory Visit (INDEPENDENT_AMBULATORY_CARE_PROVIDER_SITE_OTHER): Payer: 59 | Admitting: Internal Medicine

## 2012-10-21 VITALS — BP 124/76 | HR 79 | Temp 98.2°F | Wt 115.0 lb

## 2012-10-21 DIAGNOSIS — I7300 Raynaud's syndrome without gangrene: Secondary | ICD-10-CM

## 2012-10-21 DIAGNOSIS — R05 Cough: Secondary | ICD-10-CM

## 2012-10-21 DIAGNOSIS — J329 Chronic sinusitis, unspecified: Secondary | ICD-10-CM

## 2012-10-21 DIAGNOSIS — R059 Cough, unspecified: Secondary | ICD-10-CM

## 2012-10-21 DIAGNOSIS — J069 Acute upper respiratory infection, unspecified: Secondary | ICD-10-CM

## 2012-10-21 HISTORY — DX: Raynaud's syndrome without gangrene: I73.00

## 2012-10-21 MED ORDER — HYDROCODONE-HOMATROPINE 5-1.5 MG/5ML PO SYRP: 5.0000 mL | ORAL_SOLUTION | Freq: Three times a day (TID) | ORAL | Status: DC | PRN

## 2012-10-21 NOTE — Telephone Encounter (Signed)
Patient Information:  Caller Name: Damoni  Phone: [phone]  Patient: [name], [name]  Gender: Female  DOB: Nov 30, 1955  Age: 57 Years  PCP: Kriste Basque [facility])  Office Follow Up:  Does the office need to follow up with this patient?: No  Instructions For The Office: N/A   Symptoms  Reason For Call & Symptoms: Patient reports history of bronchitis; states, "I am still not over this" Frequent cough that interferes with sleep in spite of home care.  Hoarse, red raw throat.  Onset symptoms.  Rates sore throat as worst symptom with pain at 5 of 10.  Reviewed Health History In EMR: Yes  Reviewed Medications In EMR: Yes  Reviewed Allergies In EMR: Yes  Reviewed Surgeries / Procedures: Yes  Date of Onset of Symptoms: 09/24/2012  Treatments Tried: Antihistamines, Decongestants, Rx cough syrup, Z-Pack  Treatments Tried Worked: No  Guideline(s) Used:  Sore Throat  Disposition Per Guideline:   See Today in Office  Reason For Disposition Reached:   Diabetes mellitus or weak immune system (e.g., HIV positive, cancer chemo, splenectomy, organ transplant, chronic steroids)  Advice Given:  For Relief of Sore Throat Pain:  Sip warm chicken broth or apple juice.  Suck on hard candy or a throat lozenge (over-the-counter).  Gargle warm salt water 3 times daily (1 teaspoon of salt in 8 oz or 240 ml of warm water).  Avoid cigarette smoke.  Pain Medicines:  For pain relief, you can take either acetaminophen, ibuprofen, or naproxen.  They are over-the-counter (OTC) pain drugs. You can buy them at the drugstore.  Soft Diet:   Cold drinks and milk shakes are especially good (Reason: swollen tonsils can make some foods hard to swallow).  Liquids:  Adequate liquid intake is important to prevent dehydration. Drink 6-8 glasses of water per day.  Contagiousness:   You can return to work or school after the fever is gone and you feel well enough to participate in normal activities. If  your doctor determines that you have Strep throat, then you will need to take an antibiotic for 24 hours before you can return.  Call Back If:  Fever lasts longer than 3 days  You become worse.  Patient Will Follow Care Advice:  YES  Appointment Scheduled:  10/21/2012 15:45:00 Appointment Scheduled Provider:  Kriste Basque [facility])

## 2012-10-21 NOTE — Assessment & Plan Note (Signed)
I suspect patient's cough and laryngitis this secondary to viral etiology. Her chest exam is clear. However she is on immunosuppressants due to history of rheumatoid arthritis. Rule out atypical pneumonia with chest x-ray. I advised against using antibiotics when there is no clear evidence of bacterial infection. I recommended symptomatic treatment. Use Hycodan for cough. Patient advised to call office if symptoms persist or worsen.

## 2012-10-21 NOTE — Assessment & Plan Note (Signed)
Uses amlodipine for Raynaud's.  Dr. Dareen Piano - Rheumatologist.

## 2012-10-21 NOTE — Progress Notes (Signed)
Subjective:    Patient ID: [name], female    DOB: Apr 03, 1956, 57 y.o.   MRN: [id number]  HPI  57 year old white female with history of rheumatoid arthritis, hypertension and Raynaud's complains of intermittent cough and congestion for one month. Her symptoms initially started on March 15th. She was seen for possible bronchitis and treated with Z-Pak and albuterol inhalers. Patient reports wheezing has resolved she has persistent nonproductive cough. She has trouble sleeping. She has mild facial pressure. She denies any discolored nasal discharge.  Review of Systems Low-grade fever during initial start her symptoms, no shortness of breath Fatigue    Past Medical History  Diagnosis Date  . Depression   . GERD (gastroesophageal reflux disease)   . Anxiety   . Arthritis     Rheumatoid  . IBS (irritable bowel syndrome)     constipation predominant  . Interstitial cystitis   . Lupus   . Hemorrhoids   . Raynaud disease     History   Social History  . Marital Status: Married    Spouse Name: N/A    Number of Children: N/A  . Years of Education: N/A   Occupational History  . RN [employer]   Social History Main Topics  . Smoking status: Never Smoker   . Smokeless tobacco: Never Used  . Alcohol Use: No  . Drug Use: No  . Sexually Active: Not on file   Other Topics Concern  . Not on file   Social History Narrative  . No narrative on file    Past Surgical History  Procedure Laterality Date  . Abdominal hysterectomy  2007  . Tonsillectomy    . Excision morton's neuroma    . Colonoscopy  12/24/04    internal and external hemorrhoids  . Flexible sigmoidoscopy  03/07/08    internal and external hemorrhoids  . Upper gastrointestinal endoscopy  10/23/10    gastritis, irregular Z-line    Family History  Problem Relation Age of Onset  . Coronary artery disease Father   . Diabetes      uncle  . Diverticulosis Mother   . Hypertension Mother   . Osteoarthritis  Mother   . Colon cancer Neg Hx     Allergies  Allergen Reactions  . Morphine Sulfate Nausea And Vomiting  . Penicillins Hives    Denies airway involvement  . Promethazine Hcl Other (See Comments)    Muscle twitching and contracture   . Beef-Derived Products Other (See Comments)    Reaction: severe cramping  . Citrus Other (See Comments)    Causes Interstitial cystitis flares  . Doxycycline Nausea And Vomiting    Current Outpatient Prescriptions on File Prior to Visit  Medication Sig Dispense Refill  . albuterol (PROVENTIL HFA;VENTOLIN HFA) 108 (90 BASE) MCG/ACT inhaler Inhale 2 puffs into the lungs every 6 (six) hours as needed for wheezing.  1 Inhaler  0  . amLODipine (NORVASC) 5 MG tablet Take 2.5 mg by mouth 2 (two) times daily.       . Biotin (BIOTIN 5000) 5 MG CAPS Take 1 capsule by mouth every evening.      Marland Kitchen buPROPion (WELLBUTRIN) 75 MG tablet Take 75 mg by mouth 2 (two) times daily.      . calcium-vitamin D (OSCAL WITH D) 500-200 MG-UNIT per tablet Take 2 tablets by mouth every evening.      . cholecalciferol (VITAMIN D) 1000 UNITS tablet Take 1,000 Units by mouth every evening.      Marland Kitchen  clonazePAM (KLONOPIN) 1 MG tablet Take 0.5 mg by mouth at bedtime as needed. For sleep/ anxiety      . diazepam (VALIUM) 5 MG tablet Take 5 mg by mouth every 6 (six) hours as needed. For muscle spasms      . diclofenac sodium (VOLTAREN) 1 % GEL Apply 2 g topically 4 (four) times daily as needed. For arthritic pain      . dicyclomine (BENTYL) 20 MG tablet Take 20 mg by mouth every 6 (six) hours as needed. For cramping and pain      . DPH-Lido-AlHydr-MgHydr-Simeth (FIRST-MOUTHWASH BLM) SUSP RINSE AND SWALLOW 15-30 MLS BY MOUTH AS NEEDED  474 mL  2  . fluticasone (FLONASE) 50 MCG/ACT nasal spray Place 2 sprays into the nose daily.  16 g  2  . folic acid (FOLVITE) 1 MG tablet Take 1 mg by mouth every morning.      . heparin 5000 UNIT/ML SOLN 40,000 Units, lidocaine 2 % SOLN 200 mg, sodium  bicarbonate 1 mEq/mL SOLN 5 mEq Irrigate with 1 application as directed daily as needed. For interstitial cystitis      . hydrOXYzine (ATARAX/VISTARIL) 25 MG tablet Take 25 mg by mouth daily as needed. For itching      . ibuprofen (ADVIL,MOTRIN) 200 MG tablet Take 400 mg by mouth every 6 (six) hours as needed. For pain      . methotrexate 1 GM/40ML SOLN injection Inject 15 mg into the skin once a week. Injects 0.6 ml weekly on mondays      . Multiple Vitamin (MULTIVITAMIN WITH MINERALS) TABS Take 1 tablet by mouth every evening.      Marland Kitchen NEXIUM 40 MG capsule TAKE 1 CAPSULE (40 MG TOTAL) BY MOUTH DAILY BEFORE BREAKFAST.  30 capsule  11  . sucralfate (CARAFATE) 1 GM/10ML suspension Take 1 g by mouth 3 (three) times daily as needed. For digestion      . venlafaxine (EFFEXOR) 75 MG tablet Take 1 tablet (75 mg total) by mouth 2 (two) times daily.  180 tablet  3  . zolpidem (AMBIEN) 10 MG tablet Take 1 tablet (10 mg total) by mouth at bedtime as needed for sleep.  30 tablet  1  . ZOMIG 5 MG tablet TAKE 1 TABLET BY MOUTH AT THE TIME OF HEADACHE, MAY REPEAT IN ONE HOUR IF HEADACHE PERSISTS  9 tablet  3  . sodium chloride 0.9 % SOLN 250 mL with inFLIXimab 100 MG SOLR 5 mg/kg Inject 5 mg/kg into the vein every 28 (twenty-eight) days. Sherrian Divers rheumatologist  medical [facility] is remicade is received       No current facility-administered medications on file prior to visit.    BP 124/76  Pulse 79  Temp(Src) 98.2 F (36.8 C) (Oral)  Wt 115 lb (52.164 kg)  BMI 20.38 kg/m2  SpO2 97%    Objective:   Physical Exam  Constitutional: She appears well-developed and well-nourished.  HENT:  Head: Normocephalic and atraumatic.  Right Ear: External ear normal.  Left Ear: External ear normal.  Mouth/Throat: No oropharyngeal exudate.  Mild oropharyngeal erythema, signs of postnasal drip  Neck: Neck supple.  No neck tenderness  Cardiovascular: Normal rate, regular rhythm and normal  heart sounds.   Pulmonary/Chest: Effort normal and breath sounds normal. She has no wheezes.  Skin: Skin is warm and dry.  Psychiatric: She has a normal mood and affect. Her behavior is normal.  Assessment & Plan:

## 2012-10-21 NOTE — Patient Instructions (Addendum)
Gargle with warm salt water twice daily Use intranasal saline spray 4-5 times daily Please contact our office if your symptoms do not improve or gets worse.

## 2012-10-22 ENCOUNTER — Ambulatory Visit: Payer: Self-pay | Admitting: Family Medicine

## 2012-11-08 ENCOUNTER — Other Ambulatory Visit: Payer: Self-pay | Admitting: Internal Medicine

## 2012-12-08 ENCOUNTER — Ambulatory Visit: Payer: 59 | Admitting: Internal Medicine

## 2012-12-15 ENCOUNTER — Other Ambulatory Visit (HOSPITAL_COMMUNITY): Payer: Self-pay | Admitting: Neurosurgery

## 2012-12-15 DIAGNOSIS — M545 Low back pain, unspecified: Secondary | ICD-10-CM

## 2012-12-17 ENCOUNTER — Ambulatory Visit (HOSPITAL_COMMUNITY)
Admission: RE | Admit: 2012-12-17 | Discharge: 2012-12-17 | Disposition: A | Discharge status: Home / Self Care | Payer: 59 | Source: Ambulatory Visit | Attending: Neurosurgery | Admitting: Neurosurgery

## 2012-12-17 DIAGNOSIS — M5137 Other intervertebral disc degeneration, lumbosacral region: Secondary | ICD-10-CM | POA: Insufficient documentation

## 2012-12-17 DIAGNOSIS — K6289 Other specified diseases of anus and rectum: Secondary | ICD-10-CM | POA: Insufficient documentation

## 2012-12-17 DIAGNOSIS — M51379 Other intervertebral disc degeneration, lumbosacral region without mention of lumbar back pain or lower extremity pain: Secondary | ICD-10-CM | POA: Insufficient documentation

## 2012-12-17 DIAGNOSIS — M545 Low back pain, unspecified: Secondary | ICD-10-CM

## 2012-12-17 DIAGNOSIS — R209 Unspecified disturbances of skin sensation: Secondary | ICD-10-CM | POA: Insufficient documentation

## 2012-12-17 DIAGNOSIS — M48061 Spinal stenosis, lumbar region without neurogenic claudication: Secondary | ICD-10-CM | POA: Insufficient documentation

## 2012-12-17 DIAGNOSIS — M4310 Spondylolisthesis, site unspecified: Secondary | ICD-10-CM | POA: Insufficient documentation

## 2013-01-05 ENCOUNTER — Ambulatory Visit: Payer: 59 | Admitting: Internal Medicine

## 2013-01-06 ENCOUNTER — Other Ambulatory Visit: Payer: Self-pay | Admitting: Neurosurgery

## 2013-01-07 ENCOUNTER — Telehealth: Payer: Self-pay | Admitting: Internal Medicine

## 2013-01-07 MED ORDER — HYDROCORTISONE ACE-PRAMOXINE 2.5-1 % RE CREA: TOPICAL_CREAM | Freq: Three times a day (TID) | RECTAL | Status: DC

## 2013-01-07 NOTE — Telephone Encounter (Signed)
Requesting analpram for hemorrhoids that are swollen and bothering her.

## 2013-01-24 ENCOUNTER — Telehealth: Payer: Self-pay | Admitting: Internal Medicine

## 2013-01-24 NOTE — Telephone Encounter (Signed)
Triage Call Report Triage Record Num: [id number] Operator: Ether Griffins Patient Name: [name] Call Date & Time: 01/21/2013 5:16:04PM Patient Phone: [phone] PCP: Valetta Mole. Swords Patient Gender: Female PCP Fax : [phone] Patient DOB: 03-Aug-1955 Practice Name: [facility] Reason for Call: Caller: [name]/Patient; PCP: [name] (Adults only); CB#: [phone]; Calling about red rash on face and scalp, feels like sores on scalp--onset 01/19/13,petechiae on legs--onset 01/20/13, fever on 99.8--onset 01/21/13, joint pain worse today. Hx of rheumatoid srthritis. Guideline: Rash. Disposition: See ED Immediately. Reason for Disposition: Purplish pinpoint rash(petechiae). Advised to go to ED. Protocol(s) Used: Rash Recommended Outcome per Protocol: See ED Immediately Reason for Outcome: Purplish pinpoint rash (petechiae) Care Advice: ~ Another adult should drive. ~ IMMEDIATE ACTION Write down provider's name. List or place the following in a bag for transport with the patient: current prescription and/or nonprescription medications; alternative treatments, therapies and medications; and street drugs. ~ 07/

## 2013-02-04 ENCOUNTER — Encounter: Payer: Self-pay | Admitting: Physician Assistant

## 2013-02-04 ENCOUNTER — Ambulatory Visit (INDEPENDENT_AMBULATORY_CARE_PROVIDER_SITE_OTHER): Payer: 59 | Admitting: Physician Assistant

## 2013-02-04 VITALS — BP 100/76 | HR 84 | Ht 63.0 in | Wt 118.2 lb

## 2013-02-04 DIAGNOSIS — R198 Other specified symptoms and signs involving the digestive system and abdomen: Secondary | ICD-10-CM

## 2013-02-04 DIAGNOSIS — R194 Change in bowel habit: Secondary | ICD-10-CM

## 2013-02-04 DIAGNOSIS — K5900 Constipation, unspecified: Secondary | ICD-10-CM

## 2013-02-04 MED ORDER — NA SULFATE-K SULFATE-MG SULF 17.5-3.13-1.6 GM/177ML PO SOLN: 1.0000 | Freq: Once | ORAL | Status: DC

## 2013-02-04 NOTE — Progress Notes (Signed)
Subjective:    Patient ID: [name], female    DOB: 1955-09-17, 57 y.o.   MRN: [id number]  HPI  [name] is a pleasant 57 year old white female known to Dr. Leone Payor. 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 She has multiple medical problems including anxiety/depression, interstitial cystitis, hypertension, rheumatoid arthritis , Raynauds phenomenon, and hemorrhoids. She had had colonoscopy in June of 2006 which was a normal exam with the exception of small internal and necks terminal hemorrhoids. She had a sigmoidoscopy in August of 2009 again with finding of internal and necks are all hemorrhoids. EGD was done for epigastric pain in April of 2012 with finding of gastritis .  She comes in today with new complaint of abrupt change in her bowel habits with constipation which has  been severe. She says she has had problems with constipation in the past but usually is able to take Dulcolax  with resolution. She says she drinks a lot of fluids eat a lot of fiber and has not had any changes in her medications ,supplements ,exercise etc. over the past month. She says she has been having significant problems with constipation over at least the past one month with inability to have a bowel movement unless she takes bottle of mag citrate. She says she's taking MiraLax twice a day every day which softens the stool but she has absolutely no urge for a bowel movement. She says her abdomen feels bloated and gassy but she's not really having any pain. She has not noted any melena or hematochezia. She has been diagnosed with spinal stenosis with the only pain medication she's using currently  A voltaren patch.    Review of Systems  Constitutional: Negative.   HENT: Negative.   Eyes: Negative.   Respiratory: Negative.   Cardiovascular: Negative.   Gastrointestinal: Positive for abdominal pain and constipation.  Endocrine: Negative.   Genitourinary: Negative.   Musculoskeletal: Positive for back pain.   Skin: Negative.   Allergic/Immunologic: Negative.   Neurological: Negative.   Hematological: Negative.   Psychiatric/Behavioral: Negative.    Outpatient Prescriptions Prior to Visit  Medication Sig Dispense Refill  . albuterol (PROVENTIL HFA;VENTOLIN HFA) 108 (90 BASE) MCG/ACT inhaler Inhale 2 puffs into the lungs every 6 (six) hours as needed for wheezing.  1 Inhaler  0  . amLODipine (NORVASC) 5 MG tablet Take 2.5 mg by mouth 2 (two) times daily.       . calcium-vitamin D (OSCAL WITH D) 500-200 MG-UNIT per tablet Take 2 tablets by mouth every evening.      . cholecalciferol (VITAMIN D) 1000 UNITS tablet Take 1,000 Units by mouth every evening.      . clonazePAM (KLONOPIN) 1 MG tablet Take 0.5 mg by mouth at bedtime as needed. For sleep/ anxiety      . diazepam (VALIUM) 5 MG tablet Take 5 mg by mouth every 6 (six) hours as needed. For muscle spasms      . diclofenac sodium (VOLTAREN) 1 % GEL Apply 2 g topically 4 (four) times daily as needed. For arthritic pain      . dicyclomine (BENTYL) 20 MG tablet Take 20 mg by mouth every 6 (six) hours as needed. For cramping and pain      . DPH-Lido-AlHydr-MgHydr-Simeth (FIRST-MOUTHWASH BLM) SUSP RINSE AND SWALLOW 15-30 MLS BY MOUTH AS NEEDED  474 mL  2  . fluticasone (FLONASE) 50 MCG/ACT nasal spray Place 2 sprays into the nose daily.  16 g  2  .  hydrocortisone-pramoxine (ANALPRAM-HC) 2.5-1 % rectal cream Place rectally 3 (three) times daily.  30 g  0  . ibuprofen (ADVIL,MOTRIN) 200 MG tablet Take 400 mg by mouth every 6 (six) hours as needed. For pain      . Multiple Vitamin (MULTIVITAMIN WITH MINERALS) TABS Take 1 tablet by mouth every evening.      Marland Kitchen NEXIUM 40 MG capsule TAKE 1 CAPSULE (40 MG TOTAL) BY MOUTH DAILY BEFORE BREAKFAST.  30 capsule  11  . sodium chloride 0.9 % SOLN 250 mL with inFLIXimab 100 MG SOLR 5 mg/kg Inject 5 mg/kg into the vein every 28 (twenty-eight) days. Sherrian Divers rheumatologist  medical [facility] [place] is  remicade is received      . sucralfate (CARAFATE) 1 GM/10ML suspension Take 1 g by mouth 3 (three) times daily as needed. For digestion      . zolpidem (AMBIEN) 10 MG tablet Take 1 tablet (10 mg total) by mouth at bedtime as needed for sleep.  30 tablet  1  . ZOMIG 5 MG tablet TAKE 1 TABLET BY MOUTH AT THE TIME OF HEADACHE, MAY REPEAT IN ONE HOUR IF HEADACHE PERSISTS  9 tablet  3  . venlafaxine (EFFEXOR) 75 MG tablet TAKE 1 TABLET BY MOUTH TWICE DAILY  180 tablet  3  . Biotin (BIOTIN 5000) 5 MG CAPS Take 1 capsule by mouth every evening.      Marland Kitchen buPROPion (WELLBUTRIN) 75 MG tablet Take 75 mg by mouth 2 (two) times daily.      . folic acid (FOLVITE) 1 MG tablet Take 1 mg by mouth every morning.      . heparin 5000 UNIT/ML SOLN 40,000 Units, lidocaine 2 % SOLN 200 mg, sodium bicarbonate 1 mEq/mL SOLN 5 mEq Irrigate with 1 application as directed daily as needed. For interstitial cystitis      . HYDROcodone-homatropine (HYCODAN) 5-1.5 MG/5ML syrup Take 5 mLs by mouth every 8 (eight) hours as needed for cough.  120 mL  0  . hydrOXYzine (ATARAX/VISTARIL) 25 MG tablet Take 25 mg by mouth daily as needed. For itching      . methotrexate 1 GM/40ML SOLN injection Inject 15 mg into the skin once a week. Injects 0.6 ml weekly on mondays       No facility-administered medications prior to visit.   Allergies  Allergen Reactions  . Morphine Sulfate Nausea And Vomiting  . Penicillins Hives    Denies airway involvement  . Promethazine Hcl Other (See Comments)    Muscle twitching and contracture   . Aspartame And Phenylalanine     Migraines  . Beef-Derived Products Other (See Comments)    Reaction: severe cramping  . Citrus Other (See Comments)    Causes Interstitial cystitis flares  . Doxycycline Nausea And Vomiting   Patient Active Problem List   Diagnosis Date Noted  . URI (upper respiratory infection) 10/21/2012  . Raynaud's disease /phenomenon 10/21/2012  . HTN (hypertension) 03/31/2012  .  Chest pain 03/31/2012  . Dysuria 03/26/2012  . Heart palpitations 02/16/2012  . Hemorrhoids, thrombosed-left side 12/10/2011  . Gastritis 09/17/2011  . Rheumatoid arthritis 09/17/2011  . IRRITABLE BOWEL SYNDROME 12/14/2009  . INTERSTITIAL CYSTITIS 12/14/2009  . UNSPECIFIED VITAMIN D DEFICIENCY 05/16/2008  . ANXIETY 02/13/2007  . GERD 02/13/2007  . HYPERTENSION, GESTATIONAL 02/13/2007  . DEPRESSION 10/18/2006  . CYST, OVARIAN NEC/NOS 09/30/2006  . HEMORRHOIDS 12/24/2004   History  Substance Use Topics  . Smoking status: Never Smoker   .  Smokeless tobacco: Never Used  . Alcohol Use: No   family history includes Coronary artery disease in her father; Diabetes in an unspecified family member; Diverticulosis in her mother; Hypertension in her mother; and Osteoarthritis in her mother.  There is no history of Colon cancer.     Objective:   Physical Exam  well-developed white female in no acute distress, pleasant blood pressure 100/76 pulse 84 height 5 foot 3 weight 118. HEENT; nontraumatic normocephalic EOMI PERRLA sclera anicteric, Supple; no JVD, Cardiovascular ;regular rate and rhythm with S1-S2 no murmur or gallop, Pulmonary; clear bilaterally, Abdomen; soft she has some mild generalized tenderness there is no guarding or rebound no palpable mass or hepatosplenomegaly no distention bowel sounds are present, Rectal; exam not done, Extremities; no clubbing cyanosis or edema skin warm and dry, Psych; mood and affect normal and appropriate.        Assessment & Plan:  #59  57 year old female with severe obstipation and change in bowel habits over the past 4-5 weeks. Etiology is not clear this may be functional. We'll rule out hypothyroid and also need to rule out occult colon lesion. #2 rheumatoid arthritis #3 interstitial cystitis #4 hypertension #5 anxiety/depression #6 internal and external hemorrhoids #7 history of Raynauds  Plan; check TSH Purged bowel with a Suprep this  weekend, then start trial of Linzess 145 mcg once daily. She was given samples to try over the next couple of weeks. Will schedule for colonoscopy with Dr. Benjamine Mola discussed in detail with the patient and she is agreeable to proceed. She will be given Suprep (as she cannot tolerate aspartame which is one of the ingredients in  Movi prep) . Change Bentyl to when necessary.

## 2013-02-04 NOTE — Patient Instructions (Addendum)
Please go to the basement level to have your labs drawn.  You have been scheduled for a colonoscopy with propofol. Please follow written instructions given to you at your visit today.  Do the Suprep anytime you want to.  Then start the Linzess tablets, 145 mcg, one daily.  If you use inhalers (even only as needed), please bring them with you on the day of your procedure. Your physician has requested that you go to www.startemmi.com and enter the access code given to you at your visit today. This web site gives a general overview about your procedure. However, you should still follow specific instructions given to you by our office regarding your preparation for the procedure.

## 2013-02-07 ENCOUNTER — Ambulatory Visit: Payer: 59 | Admitting: Physician Assistant

## 2013-02-08 NOTE — Progress Notes (Signed)
Agree with Ms. Esterwood's assessment and plan. Carl E. Gessner, MD, FACG   

## 2013-02-10 ENCOUNTER — Other Ambulatory Visit (INDEPENDENT_AMBULATORY_CARE_PROVIDER_SITE_OTHER): Payer: 59

## 2013-02-10 DIAGNOSIS — R194 Change in bowel habit: Secondary | ICD-10-CM

## 2013-02-10 DIAGNOSIS — K5900 Constipation, unspecified: Secondary | ICD-10-CM

## 2013-02-10 DIAGNOSIS — R198 Other specified symptoms and signs involving the digestive system and abdomen: Secondary | ICD-10-CM

## 2013-02-11 ENCOUNTER — Encounter: Payer: Self-pay | Admitting: Internal Medicine

## 2013-02-11 ENCOUNTER — Ambulatory Visit (AMBULATORY_SURGERY_CENTER): Payer: 59 | Admitting: Internal Medicine

## 2013-02-11 VITALS — BP 110/74 | HR 62 | Temp 97.3°F | Resp 15 | Ht 63.0 in | Wt 116.0 lb

## 2013-02-11 DIAGNOSIS — R198 Other specified symptoms and signs involving the digestive system and abdomen: Secondary | ICD-10-CM

## 2013-02-11 LAB — TSH: TSH: 1.62 u[IU]/mL (ref 0.35–5.50)

## 2013-02-11 MED ORDER — SODIUM CHLORIDE 0.9 % IV SOLN: 500.0000 mL | INTRAVENOUS | Status: DC

## 2013-02-11 NOTE — Patient Instructions (Addendum)
The colonoscopy was norma; with a great prep! You might be better after this purge - would try Benefiber 2-3 Tbsp/day and if that is not helping we can try Linzess.  Next routine colonoscopy 2024.  I appreciate the opportunity to care for you. Iva Boop, MD, FACG   YOU HAD AN ENDOSCOPIC PROCEDURE TODAY AT THE [facility]: Refer to the procedure report that was given to you for any specific questions about what was found during the examination.  If the procedure report does not answer your questions, please call your gastroenterologist to clarify.  If you requested that your care partner not be given the details of your procedure findings, then the procedure report has been included in a sealed envelope for you to review at your convenience later.  YOU SHOULD EXPECT: Some feelings of bloating in the abdomen. Passage of more gas than usual.  Walking can help get rid of the air that was put into your GI tract during the procedure and reduce the bloating. If you had a lower endoscopy (such as a colonoscopy or flexible sigmoidoscopy) you may notice spotting of blood in your stool or on the toilet paper. If you underwent a bowel prep for your procedure, then you may not have a normal bowel movement for a few days.  DIET: Your first meal following the procedure should be a light meal and then it is ok to progress to your normal diet.  A half-sandwich or bowl of soup is an example of a good first meal.  Heavy or fried foods are harder to digest and may make you feel nauseous or bloated.  Likewise meals heavy in dairy and vegetables can cause extra gas to form and this can also increase the bloating.  Drink plenty of fluids but you should avoid alcoholic beverages for 24 hours.  ACTIVITY: Your care partner should take you home directly after the procedure.  You should plan to take it easy, moving slowly for the rest of the day.  You can resume normal activity the day after the procedure  however you should NOT DRIVE or use heavy machinery for 24 hours (because of the sedation medicines used during the test).    SYMPTOMS TO REPORT IMMEDIATELY: A gastroenterologist can be reached at any hour.  During normal business hours, 8:30 AM to 5:00 PM Monday through Friday, call [phone].  After hours and on weekends, please call the GI answering service at [phone] who will take a message and have the physician on call contact you.   Following lower endoscopy (colonoscopy or flexible sigmoidoscopy):  Excessive amounts of blood in the stool  Significant tenderness or worsening of abdominal pains  Swelling of the abdomen that is new, acute  Fever of 100F or higher   FOLLOW UP: If any biopsies were taken you will be contacted by phone or by letter within the next 1-3 weeks.  Call your gastroenterologist if you have not heard about the biopsies in 3 weeks.  Our staff will call the home number listed on your records the next business day following your procedure to check on you and address any questions or concerns that you may have at that time regarding the information given to you following your procedure. This is a courtesy call and so if there is no answer at the home number and we have not heard from you through the emergency physician on call, we will assume that you have returned to your regular daily  activities without incident.  SIGNATURES/CONFIDENTIALITY: You and/or your care partner have signed paperwork which will be entered into your electronic medical record.  These signatures attest to the fact that that the information above on your After Visit Summary has been reviewed and is understood.  Full responsibility of the confidentiality of this discharge information lies with you and/or your care-partner.  Try linzess samples as directed.

## 2013-02-11 NOTE — Op Note (Signed)
[facility] [address] [place], [address]   COLONOSCOPY PROCEDURE REPORT  PATIENT: [name], [name]  MR#: [id number] BIRTHDATE: 04-May-1956 , 57  yrs. old GENDER: Female ENDOSCOPIST: Iva Boop, MD, [facility] PROCEDURE DATE:  02/11/2013 PROCEDURE:   Colonoscopy, diagnostic First Screening Colonoscopy - Avg.  risk and is 50 yrs.  old or older - No.  Prior Negative Screening - Now for repeat screening. N/A  History of Adenoma - Now for follow-up colonoscopy & has been > or = to 3 yrs.  N/A ASA CLASS:   Class II INDICATIONS:Change in bowel habits. MEDICATIONS: propofol (Diprivan) 200mg  IV, MAC sedation, administered by CRNA, and These medications were titrated to patient response per physician's verbal order  DESCRIPTION OF PROCEDURE:   After the risks benefits and alternatives of the procedure were thoroughly explained, informed consent was obtained.  A digital rectal exam revealed no abnormalities of the rectum.   The LB PFC-H190 [id number]  endoscope was introduced through the anus and advanced to the cecum, which was identified by both the appendix and ileocecal valve. No adverse events experienced.   The quality of the prep was excellent using Suprep  The instrument was then slowly withdrawn as the colon was fully examined.      COLON FINDINGS: A normal appearing cecum, ileocecal valve, and appendiceal orifice were identified.  The ascending, hepatic flexure, transverse, splenic flexure, descending, sigmoid colon and rectum appeared unremarkable.  No polyps or cancers were seen.   A right colon retroflexion was performed.  Retroflexed views revealed no abnormalities. The time to cecum=5 minutes 51 seconds. Withdrawal time=10 minutes 31 seconds.  The scope was withdrawn and the procedure completed. COMPLICATIONS: There were no complications.  ENDOSCOPIC IMPRESSION: Normal colonoscopy  RECOMMENDATIONS: Benefiber, if that fails try Linzess 145 ug  daily Repeat routine colonoscopy 2024   eSigned:  Iva Boop, MD, [facility] 02/11/2013 8:54 AM   cc: Varney Baas, MD and The Patient

## 2013-02-11 NOTE — Progress Notes (Signed)
Patient did not experience any of the following events: a burn prior to discharge; a fall within the facility; wrong site/side/patient/procedure/implant event; or a hospital transfer or hospital admission upon discharge from the facility. (G8907) Patient did not have preoperative order for IV antibiotic SSI prophylaxis. (G8918)  

## 2013-02-11 NOTE — Progress Notes (Signed)
Procedure ends, to recovery, report given and VSS. 

## 2013-02-14 ENCOUNTER — Telehealth: Payer: Self-pay

## 2013-02-14 NOTE — Telephone Encounter (Signed)
  Follow up Call-  Call back number 02/11/2013 10/23/2010  Post procedure Call Back phone  # [phone], Jilda Roche works here  Programmer, multimedia to leave phone message Yes -     Patient questions:  Do you have a fever, pain , or abdominal swelling? no Pain Score  0 *  Have you tolerated food without any problems? yes  Have you been able to return to your normal activities? no  Do you have any questions about your discharge instructions: Diet   no Medications  no Follow up visit  no  Do you have questions or concerns about your Care? no  Actions: * If pain score is 4 or above: No action needed, pain <4.

## 2013-03-02 ENCOUNTER — Ambulatory Visit: Payer: 59 | Admitting: Licensed Clinical Social Worker

## 2013-03-02 ENCOUNTER — Other Ambulatory Visit: Payer: Self-pay | Admitting: Neurosurgery

## 2013-03-02 DIAGNOSIS — M48061 Spinal stenosis, lumbar region without neurogenic claudication: Secondary | ICD-10-CM

## 2013-03-04 ENCOUNTER — Ambulatory Visit
Admission: RE | Admit: 2013-03-04 | Discharge: 2013-03-04 | Disposition: A | Discharge status: Home / Self Care | Payer: 59 | Source: Ambulatory Visit | Attending: Neurosurgery | Admitting: Neurosurgery

## 2013-03-04 VITALS — BP 112/74 | HR 80

## 2013-03-04 DIAGNOSIS — I7300 Raynaud's syndrome without gangrene: Secondary | ICD-10-CM

## 2013-03-04 DIAGNOSIS — M48061 Spinal stenosis, lumbar region without neurogenic claudication: Secondary | ICD-10-CM

## 2013-03-04 MED ORDER — IOHEXOL 180 MG/ML  SOLN
1.0000 mL | Freq: Once | INTRAMUSCULAR | Status: AC | PRN
  Administered 2013-03-04: 1 mL via EPIDURAL

## 2013-03-04 MED ORDER — METHYLPREDNISOLONE ACETATE 40 MG/ML INJ SUSP (RADIOLOG
120.0000 mg | Freq: Once | INTRAMUSCULAR | Status: AC
  Administered 2013-03-04: 120 mg via EPIDURAL

## 2013-03-17 ENCOUNTER — Other Ambulatory Visit: Payer: Self-pay | Admitting: *Deleted

## 2013-03-17 MED ORDER — ZOLPIDEM TARTRATE 10 MG PO TABS: 10.0000 mg | ORAL_TABLET | Freq: Every evening | ORAL | Status: DC | PRN

## 2013-03-25 ENCOUNTER — Telehealth: Payer: Self-pay | Admitting: Internal Medicine

## 2013-03-25 NOTE — Telephone Encounter (Signed)
PT is calling to request orders for a lipid test. Please assist.

## 2013-03-29 ENCOUNTER — Telehealth: Payer: Self-pay | Admitting: Internal Medicine

## 2013-03-29 MED ORDER — LINACLOTIDE 145 MCG PO CAPS: 145.0000 ug | ORAL_CAPSULE | Freq: Every day | ORAL | Status: DC

## 2013-03-29 MED ORDER — PANTOPRAZOLE SODIUM 40 MG PO TBEC: 40.0000 mg | DELAYED_RELEASE_TABLET | Freq: Every day | ORAL | Status: DC

## 2013-03-29 MED ORDER — SUCRALFATE 1 GM/10ML PO SUSP: 1.0000 g | Freq: Three times a day (TID) | ORAL | Status: DC | PRN

## 2013-03-29 NOTE — Telephone Encounter (Signed)
Asking for refills of Linzess, PPI and carafate

## 2013-03-30 NOTE — Telephone Encounter (Signed)
She probably needs a visit to review labs/meds etc... Have her see padonda

## 2013-03-30 NOTE — Telephone Encounter (Signed)
As per note from dr swords ,she can make ov with padonda and she can order labs

## 2013-04-13 ENCOUNTER — Ambulatory Visit (INDEPENDENT_AMBULATORY_CARE_PROVIDER_SITE_OTHER): Payer: 59 | Admitting: Family

## 2013-04-13 ENCOUNTER — Encounter: Payer: Self-pay | Admitting: Family

## 2013-04-13 VITALS — BP 110/80 | HR 98 | Wt 117.0 lb

## 2013-04-13 DIAGNOSIS — E7800 Pure hypercholesterolemia, unspecified: Secondary | ICD-10-CM

## 2013-04-13 DIAGNOSIS — J309 Allergic rhinitis, unspecified: Secondary | ICD-10-CM

## 2013-04-13 MED ORDER — FLUTICASONE PROPIONATE 50 MCG/ACT NA SUSP: 2.0000 | Freq: Every day | NASAL | Status: DC

## 2013-04-14 ENCOUNTER — Encounter: Payer: Self-pay | Admitting: Family

## 2013-04-14 ENCOUNTER — Other Ambulatory Visit (INDEPENDENT_AMBULATORY_CARE_PROVIDER_SITE_OTHER): Payer: 59

## 2013-04-14 DIAGNOSIS — E7800 Pure hypercholesterolemia, unspecified: Secondary | ICD-10-CM

## 2013-04-14 LAB — LIPID PANEL
Cholesterol: 185 mg/dL (ref 0–200)
HDL: 79.8 mg/dL (ref >39.00)
LDL Cholesterol: 94 mg/dL (ref 0–99)
Total CHOL/HDL Ratio: 2
Triglycerides: 56 mg/dL (ref 0.0–149.0)
VLDL: 11.2 mg/dL (ref 0.0–40.0)

## 2013-04-14 NOTE — Progress Notes (Signed)
Subjective:    Patient ID: [name], female    DOB: 04-19-1956, 57 y.o.   MRN: [id number]  HPI  57 year old female, patient of Dr. Cato Mulligan and today requesting to have her cholesterol checked. She also has a history of allergic rhinitis and takes Flonase. Tolerates the medication well. Denies any other concerns today. She had fasting labs drawn elsewhere. She has an upcoming surgery pending.  Review of Systems  Constitutional: Negative.   HENT: Negative.   Respiratory: Negative.   Cardiovascular: Negative.   Gastrointestinal: Negative.   Endocrine: Negative.   Genitourinary: Negative.   Musculoskeletal: Negative.   Skin: Negative.   Allergic/Immunologic: Positive for environmental allergies. Negative for food allergies and immunocompromised state.  Neurological: Negative.   Psychiatric/Behavioral: Negative.    Past Medical History  Diagnosis Date  . Depression   . GERD (gastroesophageal reflux disease)   . Anxiety   . IBS (irritable bowel syndrome)     constipation predominant  . Interstitial cystitis   . Lupus   . Hemorrhoids   . Allergy   . Asthma   . Blood transfusion without reported diagnosis     x3  . Heart murmur   . Osteoporosis     osteopenia  . Arthritis     Rheumatoid    History   Social History  . Marital Status: Married    Spouse Name: N/A    Number of Children: N/A  . Years of Education: N/A   Occupational History  . RN [employer]   Social History Main Topics  . Smoking status: Never Smoker   . Smokeless tobacco: Never Used  . Alcohol Use: 0.5 oz/week    1 drink(s) per week  . Drug Use: No  . Sexual Activity: Not on file   Other Topics Concern  . Not on file   Social History Narrative  . No narrative on file    Past Surgical History  Procedure Laterality Date  . Abdominal hysterectomy  2007  . Tonsillectomy    . Excision morton's neuroma    . Colonoscopy  12/24/04    internal and external hemorrhoids  . Flexible  sigmoidoscopy  03/07/08    internal and external hemorrhoids  . Upper gastrointestinal endoscopy  10/23/10    gastritis, irregular Z-line    Family History  Problem Relation Age of Onset  . Coronary artery disease Father   . Diabetes      uncle  . Diverticulosis Mother   . Hypertension Mother   . Osteoarthritis Mother   . Colon cancer Neg Hx     Allergies  Allergen Reactions  . Aspartame And Phenylalanine Other (See Comments)    Migraines  . Beef-Derived Products Other (See Comments)    severe cramping  . Citrus Other (See Comments)    Causes Interstitial cystitis flares  . Penicillins Hives    Denies airway involvement  . Promethazine Hcl Other (See Comments)    Muscle twitching and contracture   . Doxycycline Nausea And Vomiting  . Morphine Sulfate Nausea And Vomiting    Current Outpatient Prescriptions on File Prior to Visit  Medication Sig Dispense Refill  . albuterol (PROVENTIL HFA;VENTOLIN HFA) 108 (90 BASE) MCG/ACT inhaler Inhale 2 puffs into the lungs every 6 (six) hours as needed for wheezing.  1 Inhaler  0  . amLODipine (NORVASC) 5 MG tablet Take 2.5 mg by mouth 2 (two) times daily.       . calcium-vitamin D (OSCAL WITH D)  500-200 MG-UNIT per tablet Take 2 tablets by mouth every evening.      . cholecalciferol (VITAMIN D) 1000 UNITS tablet Take 1,000 Units by mouth every evening.      . clonazePAM (KLONOPIN) 1 MG tablet Take 0.5 mg by mouth at bedtime as needed. For sleep/ anxiety      . diazepam (VALIUM) 5 MG tablet Take 5 mg by mouth every 6 (six) hours as needed. For muscle spasms      . diclofenac sodium (VOLTAREN) 1 % GEL Apply 2 g topically 4 (four) times daily as needed. For arthritic pain      . dicyclomine (BENTYL) 20 MG tablet Take 20 mg by mouth every 6 (six) hours as needed. For cramping and pain      . DPH-Lido-AlHydr-MgHydr-Simeth (FIRST-MOUTHWASH BLM) SUSP RINSE AND SWALLOW 15-30 MLS BY MOUTH AS NEEDED  474 mL  2  . hydrocortisone-pramoxine  (ANALPRAM-HC) 2.5-1 % rectal cream Place rectally 3 (three) times daily.  30 g  0  . ibuprofen (ADVIL,MOTRIN) 200 MG tablet Take 400 mg by mouth every 6 (six) hours as needed. For pain      . Linaclotide (LINZESS) 145 MCG CAPS capsule Take 1 capsule (145 mcg total) by mouth daily.  30 capsule  11  . Multiple Vitamin (MULTIVITAMIN WITH MINERALS) TABS Take 1 tablet by mouth every evening.      . pantoprazole (PROTONIX) 40 MG tablet Take 1 tablet (40 mg total) by mouth daily before breakfast.  30 tablet  11  . sodium chloride 0.9 % SOLN 250 mL with inFLIXimab 100 MG SOLR 5 mg/kg Inject 5 mg/kg into the vein every 28 (twenty-eight) days. Sherrian Divers rheumatologist  medical [facility] [place] is remicade is received      . sucralfate (CARAFATE) 1 GM/10ML suspension Take 10 mLs (1 g total) by mouth 3 (three) times daily as needed. For digestion  840 mL  5  . venlafaxine (EFFEXOR) 75 MG tablet       . zolpidem (AMBIEN) 10 MG tablet Take 1 tablet (10 mg total) by mouth at bedtime as needed for sleep.  30 tablet  0  . ZOMIG 5 MG tablet TAKE 1 TABLET BY MOUTH AT THE TIME OF HEADACHE, MAY REPEAT IN ONE HOUR IF HEADACHE PERSISTS  9 tablet  3   No current facility-administered medications on file prior to visit.    BP 110/80  Pulse 98  Wt 117 lb (53.071 kg)  BMI 20.73 kg/m2chart    Objective:   Physical Exam  Constitutional: She is oriented to person, place, and time. She appears well-developed and well-nourished.  HENT:  Right Ear: External ear normal.  Left Ear: External ear normal.  Nose: Nose normal.  Mouth/Throat: Oropharynx is clear and moist.  Neck: Normal range of motion. Neck supple.  Cardiovascular: Normal rate, regular rhythm and normal heart sounds.   Pulmonary/Chest: Effort normal and breath sounds normal.  Musculoskeletal: Normal range of motion.  Neurological: She is alert and oriented to person, place, and time.  Skin: Skin is warm and dry.  Psychiatric: She has a  normal mood and affect.          Assessment & Plan:  Assessment:  1. Hypercholesterolemia 2. Allergic rhinitis  Plan: Continue current medications. Encouraged healthy diet, exercise. Followup pending labs, in one year and sooner as needed.

## 2013-04-15 ENCOUNTER — Ambulatory Visit: Payer: Self-pay

## 2013-04-18 ENCOUNTER — Encounter (HOSPITAL_COMMUNITY): Payer: Self-pay | Admitting: Pharmacy Technician

## 2013-04-20 ENCOUNTER — Other Ambulatory Visit: Payer: Self-pay | Admitting: Neurosurgery

## 2013-04-20 ENCOUNTER — Encounter (HOSPITAL_COMMUNITY): Payer: Self-pay

## 2013-04-20 ENCOUNTER — Ambulatory Visit (INDEPENDENT_AMBULATORY_CARE_PROVIDER_SITE_OTHER): Payer: 59

## 2013-04-20 ENCOUNTER — Encounter (HOSPITAL_COMMUNITY)
Admission: RE | Admit: 2013-04-20 | Discharge: 2013-04-20 | Disposition: A | Discharge status: Home / Self Care | Payer: 59 | Source: Ambulatory Visit | Attending: Neurosurgery | Admitting: Neurosurgery

## 2013-04-20 VITALS — BP 116/72 | HR 88 | Resp 12 | Ht 63.0 in | Wt 116.0 lb

## 2013-04-20 DIAGNOSIS — G5760 Lesion of plantar nerve, unspecified lower limb: Secondary | ICD-10-CM

## 2013-04-20 DIAGNOSIS — Z01812 Encounter for preprocedural laboratory examination: Secondary | ICD-10-CM | POA: Insufficient documentation

## 2013-04-20 DIAGNOSIS — G5762 Lesion of plantar nerve, left lower limb: Secondary | ICD-10-CM

## 2013-04-20 HISTORY — DX: Anesthesia of skin: R20.0

## 2013-04-20 HISTORY — DX: Pneumonia, unspecified organism: J18.9

## 2013-04-20 HISTORY — DX: Personal history of other diseases of the nervous system and sense organs: Z86.69

## 2013-04-20 HISTORY — DX: Personal history of other diseases of the respiratory system: Z87.09

## 2013-04-20 HISTORY — DX: Other seasonal allergic rhinitis: J30.2

## 2013-04-20 HISTORY — DX: Personal history of urinary calculi: Z87.442

## 2013-04-20 HISTORY — DX: Other specified postprocedural states: Z98.890

## 2013-04-20 HISTORY — DX: Anemia, unspecified: D64.9

## 2013-04-20 HISTORY — DX: Nausea with vomiting, unspecified: R11.2

## 2013-04-20 HISTORY — DX: Dermatitis, unspecified: L30.9

## 2013-04-20 HISTORY — DX: Dorsalgia, unspecified: M54.9

## 2013-04-20 HISTORY — DX: Other chronic pain: G89.29

## 2013-04-20 LAB — BASIC METABOLIC PANEL
BUN: 13 mg/dL (ref 6–23)
CO2: 28 mEq/L (ref 19–32)
Calcium: 9.4 mg/dL (ref 8.4–10.5)
Chloride: 106 mEq/L (ref 96–112)
Creatinine, Ser: 0.79 mg/dL (ref 0.50–1.10)
GFR calc Af Amer: >90 mL/min (ref >90)
GFR calc non Af Amer: >90 mL/min (ref >90)
Glucose, Bld: 75 mg/dL (ref 70–99)
Potassium: 4 mEq/L (ref 3.5–5.1)
Sodium: 141 mEq/L (ref 135–145)

## 2013-04-20 LAB — CBC
HCT: 41.2 % (ref 36.0–46.0)
Hemoglobin: 13.8 g/dL (ref 12.0–15.0)
MCH: 30.9 pg (ref 26.0–34.0)
MCHC: 33.5 g/dL (ref 30.0–36.0)
MCV: 92.2 fL (ref 78.0–100.0)
Platelets: 196 10*3/uL (ref 150–400)
RBC: 4.47 MIL/uL (ref 3.87–5.11)
RDW: 13.4 % (ref 11.5–15.5)
WBC: 5.7 10*3/uL (ref 4.0–10.5)

## 2013-04-20 LAB — TYPE AND SCREEN
ABO/RH(D): O POS
Antibody Screen: NEGATIVE

## 2013-04-20 LAB — SURGICAL PCR SCREEN
MRSA, PCR: NEGATIVE
Staphylococcus aureus: NEGATIVE

## 2013-04-20 LAB — ABO/RH: ABO/RH(D): O POS

## 2013-04-20 NOTE — Progress Notes (Signed)
  Subjective:    Patient ID: [name], female    DOB: 1956/04/26, 57 y.o.   MRN: [id number]  HPI patient continues to have burning stinging pain affecting her third fourth toes lateral left forefoot. Patient has gone through 3 alcohol sclerosing injections to the third interspace thus far. Has some slight improvement approximately 30%. Patient is ready for her fourth injection at this time and plan for his fifth injection 1 week from now.    Review of Systems  Constitutional: Negative.   HENT: Negative.   Eyes: Negative.   Respiratory: Negative.   Cardiovascular: Negative.   Endocrine: Positive for cold intolerance.  Genitourinary: Negative.   Allergic/Immunologic: Positive for food allergies.  Neurological: Positive for numbness.  Hematological: Negative.   Psychiatric/Behavioral: Negative.        Objective:   Physical Exam neurovascular status intact and unchanged from previous visits. There is palpable pedal pulses DP and PT +2/4. Capillary refill time 3-4 seconds all digits. There is mild ecchymosis of the third and fourth toe area of the left foot. This may be consistent with series of injections having delivered. Neurologically there continues to be paresthesia affecting the third and fourth toes. There is still pain on direct lateral compression of the third interspace. No pain on compression of second or fourth interspace. Mild flexible digital contractures unchanged.        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 Injection 1 mL 4% alcohol solution dehydrated in 0.5% Marcaine plain. This was infiltrated to the third intermetatarsal space along the third common digital nerve of left foot. Patient tolerated the injection with minimal discomfort. Instructed to maintain ice pack intermittently. Reappointed in one week for a fifth injection. Plan for a one-month break before deciding on the sixth or seventh injection. Patient  will be having back surgery on the 21st of this month and has had recent blood work. No changes in plans thus far.  Alvan Dame DPM

## 2013-04-20 NOTE — Pre-Procedure Instructions (Signed)
SOLENNE MANWARREN  04/20/2013   Your procedure is scheduled on:  Mon, Oct 20 @ 7:30 AM  Report to [facility] Short Stay [facility]trance A at 5:30 AM.  Call this number if you have problems the morning of surgery: [phone]   Remember:   Do not eat food or drink liquids after midnight.   Take these medicines the morning of surgery with A SIP OF WATER: Albuterol<Bring Your Inhaler With You>,Klonopin(Clonazepam),Flonase(Fluticasone),Omeprazole(Prilosec),Zofran(Ondansetron-if needed),Tramadol(Ultram-if needed),and Effexor(Venlafaxine)                Stop using your Diclofenac gel and stop taking your Ibuprofen.No Goody's,BC's,Aleve,Aspirin,Fish Oil,or any Herbal Medications   Do not wear jewelry, make-up or nail polish.  Do not wear lotions, powders, or perfumes. You may wear deodorant.  Do not shave 48 hours prior to surgery.   Do not bring valuables to the hospital.  [facility] is not responsible                  for any belongings or valuables.               Contacts, dentures or bridgework may not be worn into surgery.  Leave suitcase in the car. After surgery it may be brought to your room.  For patients admitted to the hospital, discharge time is determined by your                treatment team.                   Special Instructions: Shower using CHG 2 nights before surgery and the night before surgery.  If you shower the day of surgery use CHG.  Use special wash - you have one bottle of CHG for all showers.  You should use approximately 1/3 of the bottle for each shower.   Please read over the following fact sheets that you were given: Pain Booklet, Coughing and Deep Breathing, Blood Transfusion Information, MRSA Information and Surgical Site Infection Prevention

## 2013-04-20 NOTE — Patient Instructions (Signed)

## 2013-04-20 NOTE — Progress Notes (Addendum)
CXR in epic from 4-14  Echo and Stress test in epic from 2013  Cardiologist is Dr.Nishan and last visit in epic from 2013  Denies ever having a heart cath  Dr.Bruce Swords  Denies EKG in past yr

## 2013-04-27 ENCOUNTER — Encounter: Payer: Self-pay | Admitting: Family

## 2013-04-27 ENCOUNTER — Ambulatory Visit (INDEPENDENT_AMBULATORY_CARE_PROVIDER_SITE_OTHER): Payer: 59

## 2013-04-27 ENCOUNTER — Ambulatory Visit (INDEPENDENT_AMBULATORY_CARE_PROVIDER_SITE_OTHER): Payer: 59 | Admitting: Family

## 2013-04-27 VITALS — BP 114/64 | HR 82 | Wt 118.0 lb

## 2013-04-27 VITALS — BP 98/64 | HR 90 | Resp 12

## 2013-04-27 DIAGNOSIS — R5381 Other malaise: Secondary | ICD-10-CM

## 2013-04-27 DIAGNOSIS — G5762 Lesion of plantar nerve, left lower limb: Secondary | ICD-10-CM

## 2013-04-27 DIAGNOSIS — R4701 Aphasia: Secondary | ICD-10-CM

## 2013-04-27 DIAGNOSIS — G5760 Lesion of plantar nerve, unspecified lower limb: Secondary | ICD-10-CM

## 2013-04-27 NOTE — Progress Notes (Signed)
Subjective:    Patient ID: [name], female    DOB: 06-Feb-1956, 57 y.o.   MRN: [id number]  HPI  57 year old white female, nonsmoker, patient of Dr. Cato Mulligan is in today with complaints of difficulty finding words and expressing herself that occurs approximately 2-3 times a day over the last 3 weeks. She's grown more more concerned. Reports a family history of dementia and scrotal vascular accident. She's had mild dizziness on and off. Reports increased fatigue. Denies any nausea or vomiting. Reports a history of a positive ANA in the past that was worked up and was not thought to be lupus. She denies any acute weakness, slurred speech.   Review of Systems  Constitutional: Positive for fatigue. Negative for fever and unexpected weight change.  Cardiovascular: Negative.   Endocrine: Negative.   Genitourinary: Negative.   Musculoskeletal: Negative.   Skin: Negative.   Allergic/Immunologic: Negative.   Neurological: Positive for speech difficulty, weakness and headaches. Negative for facial asymmetry and numbness.       Expressive aphasia  Hematological: Negative.   Psychiatric/Behavioral: Negative.    Past Medical History  Diagnosis Date  . IBS (irritable bowel syndrome)     constipation predominant  . Interstitial cystitis   . Lupus   . Hemorrhoids   . Allergy   . Blood transfusion without reported diagnosis     x3  . Heart murmur   . Osteoporosis     osteopenia  . Anxiety     takes Klonopin nighlty  . GERD (gastroesophageal reflux disease)     takes Omeprazole daily  . Nausea     takes Zofran prn  . Insomnia     takes Ambien prn  . Depression     takes Effexor daily  . PONV (postoperative nausea and vomiting)     laryngospasm-1999  . History of bronchitis     winter of 2014  . Pneumonia     hx of;last time 91yrs ago  . History of migraine     last one a yr ago and takes Zomig prn  . Seasonal allergies     Flonase daily   . Numbness     both legs and related  to back  . Arthritis     uses Diclofenac gel;Rheumatoid  . Chronic back pain   . Joint pain   . Joint swelling   . Eczema   . Chronic constipation     Miralax once weekly  . History of kidney stones     passed on her own  . History of bladder infections   . Anemia     after birth of child 85yrs ago    History   Social History  . Marital Status: Married    Spouse Name: N/A    Number of Children: N/A  . Years of Education: N/A   Occupational History  . RN [employer]   Social History Main Topics  . Smoking status: Never Smoker   . Smokeless tobacco: Never Used  . Alcohol Use: 0.5 oz/week    1 drink(s) per week     Comment: socially  . Drug Use: No  . Sexual Activity: Yes   Other Topics Concern  . Not on file   Social History Narrative  . No narrative on file    Past Surgical History  Procedure Laterality Date  . Abdominal hysterectomy  2007  . Tonsillectomy    . Excision morton's neuroma Right   . Colonoscopy  12/24/04  internal and external hemorrhoids  . Flexible sigmoidoscopy  03/07/08    internal and external hemorrhoids  . Upper gastrointestinal endoscopy  10/23/10    gastritis, irregular Z-line  . Wisdom teeth extracted    . Uterine ablation    . Colonoscopy      Family History  Problem Relation Age of Onset  . Coronary artery disease Father   . Diabetes      uncle  . Diverticulosis Mother   . Hypertension Mother   . Osteoarthritis Mother   . Colon cancer Neg Hx     Allergies  Allergen Reactions  . Aspartame And Phenylalanine Other (See Comments)    Migraines  . Beef-Derived Products Other (See Comments)    severe cramping  . Citrus Other (See Comments)    Causes Interstitial cystitis flares  . Penicillins Hives    Denies airway involvement  . Promethazine Hcl Other (See Comments)    Muscle twitching and contracture   . Adhesive [Tape]     Red splotches  . Doxycycline Nausea And Vomiting  . Morphine Sulfate Nausea And Vomiting     Current Outpatient Prescriptions on File Prior to Visit  Medication Sig Dispense Refill  . albuterol (PROVENTIL HFA;VENTOLIN HFA) 108 (90 BASE) MCG/ACT inhaler Inhale 2 puffs into the lungs every 6 (six) hours as needed for wheezing.  1 Inhaler  0  . calcium-vitamin D (OSCAL WITH D) 500-200 MG-UNIT per tablet Take 2 tablets by mouth every evening.      . cholecalciferol (VITAMIN D) 1000 UNITS tablet Take 1,000 Units by mouth every evening.      . clonazePAM (KLONOPIN) 1 MG tablet Take 0.5 mg by mouth as needed for anxiety. For sleep/ anxiety      . clonazePAM (KLONOPIN) 1 MG tablet Take 1 mg by mouth at bedtime.      . diclofenac sodium (VOLTAREN) 1 % GEL Apply 2 g topically 4 (four) times daily as needed (to hip and back for arthritic pain). For arthritic pain      . estradiol (ESTRACE) 1 MG tablet Take 1 mg by mouth every other day.      . fluticasone (FLONASE) 50 MCG/ACT nasal spray Place 2 sprays into the nose daily.  16 g  5  . ibuprofen (ADVIL,MOTRIN) 200 MG tablet Take 400 mg by mouth every 6 (six) hours as needed. For pain      . Multiple Vitamin (MULTIVITAMIN WITH MINERALS) TABS Take 1 tablet by mouth every evening.      Marland Kitchen omeprazole (PRILOSEC) 40 MG capsule Take 40 mg by mouth every morning.      . ondansetron (ZOFRAN-ODT) 4 MG disintegrating tablet Take 4 mg by mouth every 8 (eight) hours as needed for nausea.      . sodium chloride 0.9 % SOLN 250 mL with inFLIXimab 100 MG SOLR 5 mg/kg Inject 5 mg/kg into the vein every 28 (twenty-eight) days. Sherrian Divers rheumatologist  medical [facility] is remicade is received      . sucralfate (CARAFATE) 1 GM/10ML suspension Take 10 mLs (1 g total) by mouth 3 (three) times daily as needed. For digestion  840 mL  5  . traMADol (ULTRAM) 50 MG tablet Take 50 mg by mouth every 6 (six) hours as needed for pain.      Marland Kitchen venlafaxine (EFFEXOR) 75 MG tablet Take 225 mg by mouth daily.      Marland Kitchen zolmitriptan (ZOMIG) 5 MG tablet Take 5 mg  by  mouth as needed for migraine.      Marland Kitchen zolpidem (AMBIEN) 10 MG tablet Take 1 tablet (10 mg total) by mouth at bedtime as needed for sleep.  30 tablet  0   No current facility-administered medications on file prior to visit.    BP 114/64  Pulse 82  Wt 118 lb (53.524 kg)  BMI 20.91 kg/m2chart    Objective:   Physical Exam  Constitutional: She is oriented to person, place, and time. She appears well-developed and well-nourished.  HENT:  Right Ear: External ear normal.  Left Ear: External ear normal.  Nose: Nose normal.  Mouth/Throat: Oropharynx is clear and moist.  Neck: Normal range of motion. Neck supple.  Cardiovascular: Normal rate, regular rhythm and normal heart sounds.   Pulmonary/Chest: Effort normal and breath sounds normal.  Musculoskeletal: Normal range of motion.  Neurological: She is alert and oriented to person, place, and time.  Skin: Skin is warm and dry.  Psychiatric: She has a normal mood and affect.          Assessment & Plan:  Assessment: 1. Expressive aphasia 2. Fatigue  Plan: CT scan of the hand was ordered today. ANA ordered. Will notify patient of results. Discuss further treatment plan thereafter.

## 2013-04-27 NOTE — Patient Instructions (Signed)

## 2013-04-27 NOTE — Patient Instructions (Signed)
Aphasia  Aphasia is a neurological disorder caused by damage to the parts of the brain that control language.  CAUSES   Aphasia is not a disease, but a symptom of brain damage. Aphasia is commonly seen in adults who have suffered a stroke. Aphasia also can result from:  · A brain tumor.  · Infection.  · Head injury.  · A rare type of dementia called Primary Progressive Aphasia. Common types of dementia may be associated with aphasia but can also exist without language problems.  SYMPTOMS   Primary signs of the disorder include:  · Problems expressing oneself when speaking.  · Trouble understanding speech.  · Difficulty with reading and writing.  · Speaking in short or incomplete sentences.  · Speaking in sentences that don't make sense.  · Speaking unrecognizable words.  · Interpreting figurative language literally.  · Writing sentences that don't make sense.  The type and severity of language problems depend on the precise location and extent of the damaged brain tissue.  Aphasia can be divided into four broad categories:  · Expressive aphasia - difficulty in conveying thoughts through speech or writing. The patient knows what they want to say, but cannot find the words they need.  · Receptive aphasia - difficulty understanding spoken or written language. The patient hears the voice or sees the print but cannot make sense of the words.  · Anomic or amnesia aphasia - difficulty in using the correct names for particular objects, people, places, or events. This is the least severe form of aphasia.  · Global aphasia results from severe and extensive damage to the language areas of the brain. Patients lose almost all language function, both comprehension (understanding) and expression. They cannot speak, understand speech, read, or write.  TREATMENT   Sometimes an individual will completely recover from aphasia without treatment. In most cases, language therapy should begin as soon as possible. Language therapy should  be tailored to the individual needs of the patient. Therapy with a speech pathologist involves exercises in which patients:  · Read.  · Write.  · Follow directions.  · Repeat what they hear.  · Computer-aided therapy may also be used.  PROGNOSIS   The outcome of aphasia is difficult to predict. People who are younger or have less extensive brain damage do better. The location of the injury is also important. The location is a clue to prognosis. In general, patients tend to recover skills in language comprehension (understanding) more completely than those skills involving expression (speaking or writing).  Document Released: 03/22/2002 Document Revised: 09/22/2011 Document Reviewed: 05/18/2007  [employer]® Patient Information ©2014 [employer].

## 2013-04-27 NOTE — Progress Notes (Signed)
  Subjective:    Patient ID: [name], female    DOB: 1955/09/23, 57 y.o.   MRN: [id number]  HPI patient presents for followup suspect Morton's neuroma third interspace left foot. At this time is for occult sclerosing injections. Following the last injections has had tremendous improvement with little or no pain at this time minimal reproducible tenderness on palpation of the third interspace left foot.    Review of Systems  All other systems reviewed and are negative.       Objective:   Physical Exam Neurovascular status is intact. Pedal pulses palpable. No edema or ecchymosis noted on the left foot in the injection sites. There is minimal repair pain or symptomology on palpation directly or lateral compression left foot.       Assessment & Plan:  Her assessment is improving Morton's neuroma third interspace left foot following for alcohol sclerosing injections. A fifth injection consisting of 1 cc 4% dehydrated alcohol and 0.5% Marcaine plain is delivered to the third common digital nerve/third interspace left foot. Patient will maintain ice to the area as instructed at this time we'll take a 6 week break and reevaluate the need for additional injections possible sixth or seventh injections at that time. Next  Alvan Dame DPM

## 2013-04-28 ENCOUNTER — Ambulatory Visit (INDEPENDENT_AMBULATORY_CARE_PROVIDER_SITE_OTHER)
Admission: RE | Admit: 2013-04-28 | Discharge: 2013-04-28 | Disposition: A | Discharge status: Home / Self Care | Payer: 59 | Source: Ambulatory Visit | Attending: Family | Admitting: Family

## 2013-04-28 DIAGNOSIS — R5381 Other malaise: Secondary | ICD-10-CM

## 2013-04-28 DIAGNOSIS — R4701 Aphasia: Secondary | ICD-10-CM

## 2013-04-28 LAB — ANA: Anti Nuclear Antibody(ANA): NEGATIVE

## 2013-04-29 ENCOUNTER — Telehealth: Payer: Self-pay | Admitting: Internal Medicine

## 2013-04-29 NOTE — Telephone Encounter (Signed)
Pt would like results of ct scan today. Pt is having surgery on Monday at 730am. Pt has ct scan at [facility]

## 2013-04-29 NOTE — Telephone Encounter (Signed)
Per Dr. Fabian Sharp, CT normal and pt aware

## 2013-05-02 ENCOUNTER — Inpatient Hospital Stay (HOSPITAL_COMMUNITY): Payer: 59 | Admitting: Anesthesiology

## 2013-05-02 ENCOUNTER — Encounter (HOSPITAL_COMMUNITY): Payer: 59 | Admitting: Anesthesiology

## 2013-05-02 ENCOUNTER — Encounter (HOSPITAL_COMMUNITY): Payer: Self-pay | Admitting: *Deleted

## 2013-05-02 ENCOUNTER — Encounter (HOSPITAL_COMMUNITY)
Admission: RE | Disposition: A | Discharge status: Home / Self Care | Payer: 59 | Source: Ambulatory Visit | Attending: Neurosurgery

## 2013-05-02 ENCOUNTER — Inpatient Hospital Stay (HOSPITAL_COMMUNITY)
Admission: RE | Admit: 2013-05-02 | Discharge: 2013-05-04 | DRG: 460 | Disposition: A | Discharge status: Home / Self Care | Payer: 59 | Source: Ambulatory Visit | Attending: Neurosurgery | Admitting: Neurosurgery

## 2013-05-02 ENCOUNTER — Inpatient Hospital Stay (HOSPITAL_COMMUNITY): Payer: 59

## 2013-05-02 DIAGNOSIS — Z881 Allergy status to other antibiotic agents status: Secondary | ICD-10-CM

## 2013-05-02 DIAGNOSIS — M810 Age-related osteoporosis without current pathological fracture: Secondary | ICD-10-CM | POA: Diagnosis present

## 2013-05-02 DIAGNOSIS — Z888 Allergy status to other drugs, medicaments and biological substances status: Secondary | ICD-10-CM

## 2013-05-02 DIAGNOSIS — Z880 Allergy status to penicillin: Secondary | ICD-10-CM

## 2013-05-02 DIAGNOSIS — R031 Nonspecific low blood-pressure reading: Secondary | ICD-10-CM | POA: Diagnosis not present

## 2013-05-02 DIAGNOSIS — M48062 Spinal stenosis, lumbar region with neurogenic claudication: Principal | ICD-10-CM | POA: Diagnosis present

## 2013-05-02 DIAGNOSIS — G4700 Insomnia, unspecified: Secondary | ICD-10-CM | POA: Diagnosis present

## 2013-05-02 DIAGNOSIS — F329 Major depressive disorder, single episode, unspecified: Secondary | ICD-10-CM | POA: Diagnosis present

## 2013-05-02 DIAGNOSIS — L259 Unspecified contact dermatitis, unspecified cause: Secondary | ICD-10-CM | POA: Diagnosis present

## 2013-05-02 DIAGNOSIS — G8929 Other chronic pain: Secondary | ICD-10-CM | POA: Diagnosis present

## 2013-05-02 DIAGNOSIS — M329 Systemic lupus erythematosus, unspecified: Secondary | ICD-10-CM | POA: Diagnosis present

## 2013-05-02 DIAGNOSIS — M4310 Spondylolisthesis, site unspecified: Secondary | ICD-10-CM | POA: Diagnosis present

## 2013-05-02 DIAGNOSIS — M7130 Other bursal cyst, unspecified site: Secondary | ICD-10-CM | POA: Diagnosis present

## 2013-05-02 DIAGNOSIS — K5900 Constipation, unspecified: Secondary | ICD-10-CM | POA: Diagnosis present

## 2013-05-02 DIAGNOSIS — K219 Gastro-esophageal reflux disease without esophagitis: Secondary | ICD-10-CM | POA: Diagnosis present

## 2013-05-02 DIAGNOSIS — K589 Irritable bowel syndrome without diarrhea: Secondary | ICD-10-CM | POA: Diagnosis present

## 2013-05-02 DIAGNOSIS — F411 Generalized anxiety disorder: Secondary | ICD-10-CM | POA: Diagnosis present

## 2013-05-02 DIAGNOSIS — Z87440 Personal history of urinary (tract) infections: Secondary | ICD-10-CM

## 2013-05-02 DIAGNOSIS — M069 Rheumatoid arthritis, unspecified: Secondary | ICD-10-CM | POA: Diagnosis present

## 2013-05-02 DIAGNOSIS — Z8261 Family history of arthritis: Secondary | ICD-10-CM

## 2013-05-02 DIAGNOSIS — Z79899 Other long term (current) drug therapy: Secondary | ICD-10-CM

## 2013-05-02 DIAGNOSIS — Z833 Family history of diabetes mellitus: Secondary | ICD-10-CM

## 2013-05-02 DIAGNOSIS — F3289 Other specified depressive episodes: Secondary | ICD-10-CM | POA: Diagnosis present

## 2013-05-02 DIAGNOSIS — Z8249 Family history of ischemic heart disease and other diseases of the circulatory system: Secondary | ICD-10-CM

## 2013-05-02 HISTORY — PX: POSTERIOR LUMBAR FUSION: SHX6036

## 2013-05-02 SURGERY — POSTERIOR LUMBAR FUSION 1 LEVEL
Anesthesia: General | Site: Spine Lumbar

## 2013-05-02 MED ORDER — ZOLPIDEM TARTRATE 5 MG PO TABS: 10.0000 mg | ORAL_TABLET | Freq: Every evening | ORAL | Status: DC | PRN

## 2013-05-02 MED ORDER — FLUTICASONE PROPIONATE 50 MCG/ACT NA SUSP
2.0000 | Freq: Every day | NASAL | Status: DC
  Administered 2013-05-03: 2 via NASAL
  Filled 2013-05-02: qty 16

## 2013-05-02 MED ORDER — ALUM & MAG HYDROXIDE-SIMETH 200-200-20 MG/5ML PO SUSP: 30.0000 mL | Freq: Four times a day (QID) | ORAL | Status: DC | PRN

## 2013-05-02 MED ORDER — ONDANSETRON HCL 4 MG/2ML IJ SOLN: 4.0000 mg | Freq: Four times a day (QID) | INTRAMUSCULAR | Status: DC | PRN

## 2013-05-02 MED ORDER — PANTOPRAZOLE SODIUM 40 MG PO TBEC
40.0000 mg | DELAYED_RELEASE_TABLET | Freq: Every day | ORAL | Status: DC
  Administered 2013-05-03 – 2013-05-04 (×2): 40 mg via ORAL
  Filled 2013-05-02 (×2): qty 1

## 2013-05-02 MED ORDER — ESTRADIOL 1 MG PO TABS
1.0000 mg | ORAL_TABLET | ORAL | Status: DC
  Administered 2013-05-03: 1 mg via ORAL
  Filled 2013-05-02: qty 1

## 2013-05-02 MED ORDER — NEOSTIGMINE METHYLSULFATE 1 MG/ML IJ SOLN
INTRAMUSCULAR | Status: DC | PRN
  Administered 2013-05-02: 4 mg via INTRAVENOUS

## 2013-05-02 MED ORDER — VITAMIN D3 25 MCG (1000 UNIT) PO TABS
1000.0000 [IU] | ORAL_TABLET | Freq: Every evening | ORAL | Status: DC
  Administered 2013-05-02 – 2013-05-03 (×2): 1000 [IU] via ORAL
  Filled 2013-05-02 (×3): qty 1

## 2013-05-02 MED ORDER — ONDANSETRON HCL 4 MG/2ML IJ SOLN
INTRAMUSCULAR | Status: DC | PRN
  Administered 2013-05-02: 4 mg via INTRAVENOUS

## 2013-05-02 MED ORDER — ADULT MULTIVITAMIN W/MINERALS CH
1.0000 | ORAL_TABLET | Freq: Every evening | ORAL | Status: DC
  Administered 2013-05-02 – 2013-05-03 (×2): 1 via ORAL
  Filled 2013-05-02 (×3): qty 1

## 2013-05-02 MED ORDER — VENLAFAXINE HCL 75 MG PO TABS
225.0000 mg | ORAL_TABLET | Freq: Every day | ORAL | Status: DC
  Administered 2013-05-03 – 2013-05-04 (×2): 225 mg via ORAL
  Filled 2013-05-02 (×2): qty 3

## 2013-05-02 MED ORDER — CALCIUM CARBONATE-VITAMIN D 500-200 MG-UNIT PO TABS
2.0000 | ORAL_TABLET | Freq: Every evening | ORAL | Status: DC
  Administered 2013-05-02 – 2013-05-03 (×2): 2 via ORAL
  Filled 2013-05-02 (×3): qty 2

## 2013-05-02 MED ORDER — ACETAMINOPHEN 325 MG PO TABS: 650.0000 mg | ORAL_TABLET | ORAL | Status: DC | PRN

## 2013-05-02 MED ORDER — PROPOFOL 10 MG/ML IV BOLUS
INTRAVENOUS | Status: DC | PRN
  Administered 2013-05-02: 10 mg via INTRAVENOUS

## 2013-05-02 MED ORDER — ACETAMINOPHEN 650 MG RE SUPP: 650.0000 mg | RECTAL | Status: DC | PRN

## 2013-05-02 MED ORDER — CLONAZEPAM 0.5 MG PO TABS
1.0000 mg | ORAL_TABLET | Freq: Every day | ORAL | Status: DC
  Administered 2013-05-02 – 2013-05-03 (×2): 1 mg via ORAL
  Filled 2013-05-02 (×2): qty 2

## 2013-05-02 MED ORDER — MIDAZOLAM HCL 5 MG/5ML IJ SOLN
INTRAMUSCULAR | Status: DC | PRN
  Administered 2013-05-02 (×2): 1 mg via INTRAVENOUS

## 2013-05-02 MED ORDER — SODIUM CHLORIDE 0.9 % IV SOLN
INTRAVENOUS | Status: DC | PRN
  Administered 2013-05-02: 10:00:00 via INTRAVENOUS

## 2013-05-02 MED ORDER — OXYCODONE HCL 5 MG PO TABS
ORAL_TABLET | ORAL | Status: AC
  Filled 2013-05-02: qty 1

## 2013-05-02 MED ORDER — BUPIVACAINE HCL (PF) 0.25 % IJ SOLN
INTRAMUSCULAR | Status: DC | PRN
  Administered 2013-05-02: 20 mL

## 2013-05-02 MED ORDER — OXYCODONE HCL 5 MG/5ML PO SOLN: 5.0000 mg | Freq: Once | ORAL | Status: AC | PRN

## 2013-05-02 MED ORDER — BISACODYL 10 MG RE SUPP: 10.0000 mg | Freq: Every day | RECTAL | Status: DC | PRN

## 2013-05-02 MED ORDER — SODIUM CHLORIDE 0.9 % IV SOLN: 250.0000 mL | INTRAVENOUS | Status: DC

## 2013-05-02 MED ORDER — PHENOL 1.4 % MT LIQD: 1.0000 | OROMUCOSAL | Status: DC | PRN

## 2013-05-02 MED ORDER — ONDANSETRON 4 MG PO TBDP
4.0000 mg | ORAL_TABLET | Freq: Three times a day (TID) | ORAL | Status: DC | PRN
  Filled 2013-05-02: qty 1

## 2013-05-02 MED ORDER — VANCOMYCIN HCL IN DEXTROSE 750-5 MG/150ML-% IV SOLN
750.0000 mg | Freq: Two times a day (BID) | INTRAVENOUS | Status: DC
  Administered 2013-05-02 – 2013-05-04 (×4): 750 mg via INTRAVENOUS
  Filled 2013-05-02 (×5): qty 150

## 2013-05-02 MED ORDER — LIDOCAINE HCL (CARDIAC) 20 MG/ML IV SOLN
INTRAVENOUS | Status: DC | PRN
  Administered 2013-05-02: 80 mg via INTRAVENOUS

## 2013-05-02 MED ORDER — ZOLPIDEM TARTRATE 5 MG PO TABS: 5.0000 mg | ORAL_TABLET | Freq: Every evening | ORAL | Status: DC | PRN

## 2013-05-02 MED ORDER — FENTANYL CITRATE 0.05 MG/ML IJ SOLN
INTRAMUSCULAR | Status: DC | PRN
  Administered 2013-05-02: 100 ug via INTRAVENOUS
  Administered 2013-05-02: 50 ug via INTRAVENOUS

## 2013-05-02 MED ORDER — OXYCODONE HCL 5 MG PO TABS
5.0000 mg | ORAL_TABLET | Freq: Once | ORAL | Status: AC | PRN
  Administered 2013-05-02: 5 mg via ORAL

## 2013-05-02 MED ORDER — MENTHOL 3 MG MT LOZG: 1.0000 | LOZENGE | OROMUCOSAL | Status: DC | PRN

## 2013-05-02 MED ORDER — DEXAMETHASONE SODIUM PHOSPHATE 10 MG/ML IJ SOLN
10.0000 mg | INTRAMUSCULAR | Status: AC
  Administered 2013-05-02: 10 mg via INTRAVENOUS
  Filled 2013-05-02: qty 1

## 2013-05-02 MED ORDER — ALBUMIN HUMAN 5 % IV SOLN
INTRAVENOUS | Status: DC | PRN
  Administered 2013-05-02: 09:00:00 via INTRAVENOUS

## 2013-05-02 MED ORDER — SODIUM CHLORIDE 0.9 % IJ SOLN: 3.0000 mL | INTRAMUSCULAR | Status: DC | PRN

## 2013-05-02 MED ORDER — CYCLOBENZAPRINE HCL 10 MG PO TABS
10.0000 mg | ORAL_TABLET | Freq: Three times a day (TID) | ORAL | Status: DC | PRN
  Administered 2013-05-02 – 2013-05-03 (×3): 10 mg via ORAL
  Filled 2013-05-02 (×3): qty 1

## 2013-05-02 MED ORDER — HYDROCODONE-ACETAMINOPHEN 5-325 MG PO TABS: 1.0000 | ORAL_TABLET | ORAL | Status: DC | PRN

## 2013-05-02 MED ORDER — SUMATRIPTAN SUCCINATE 50 MG PO TABS
50.0000 mg | ORAL_TABLET | ORAL | Status: DC | PRN
  Filled 2013-05-02: qty 1

## 2013-05-02 MED ORDER — SUCRALFATE 1 GM/10ML PO SUSP
1.0000 g | Freq: Three times a day (TID) | ORAL | Status: DC | PRN
  Filled 2013-05-02: qty 10

## 2013-05-02 MED ORDER — FLEET ENEMA 7-19 GM/118ML RE ENEM
1.0000 | ENEMA | Freq: Once | RECTAL | Status: AC | PRN
  Filled 2013-05-02: qty 1

## 2013-05-02 MED ORDER — SENNA 8.6 MG PO TABS
1.0000 | ORAL_TABLET | Freq: Two times a day (BID) | ORAL | Status: DC
  Administered 2013-05-02 – 2013-05-04 (×4): 8.6 mg via ORAL
  Filled 2013-05-02 (×6): qty 1

## 2013-05-02 MED ORDER — THROMBIN 20000 UNITS EX SOLR
CUTANEOUS | Status: DC | PRN
  Administered 2013-05-02: 09:00:00 via TOPICAL

## 2013-05-02 MED ORDER — SODIUM CHLORIDE 0.9 % IR SOLN
Status: DC | PRN
  Administered 2013-05-02: 09:00:00

## 2013-05-02 MED ORDER — POLYETHYLENE GLYCOL 3350 17 G PO PACK
17.0000 g | PACK | Freq: Every day | ORAL | Status: DC | PRN
  Filled 2013-05-02: qty 1

## 2013-05-02 MED ORDER — CYCLOBENZAPRINE HCL 10 MG PO TABS
ORAL_TABLET | ORAL | Status: AC
  Filled 2013-05-02: qty 1

## 2013-05-02 MED ORDER — SODIUM CHLORIDE 0.9 % IV SOLN
10.0000 mg | INTRAVENOUS | Status: DC | PRN
  Administered 2013-05-02: 10 ug/min via INTRAVENOUS

## 2013-05-02 MED ORDER — HYDROMORPHONE HCL PF 1 MG/ML IJ SOLN
INTRAMUSCULAR | Status: AC
  Filled 2013-05-02: qty 1

## 2013-05-02 MED ORDER — ALBUTEROL SULFATE HFA 108 (90 BASE) MCG/ACT IN AERS
2.0000 | INHALATION_SPRAY | Freq: Four times a day (QID) | RESPIRATORY_TRACT | Status: DC | PRN
  Filled 2013-05-02: qty 6.7

## 2013-05-02 MED ORDER — ONDANSETRON HCL 4 MG/2ML IJ SOLN: 4.0000 mg | INTRAMUSCULAR | Status: DC | PRN

## 2013-05-02 MED ORDER — CLONAZEPAM 0.5 MG PO TABS
0.5000 mg | ORAL_TABLET | ORAL | Status: DC | PRN
  Filled 2013-05-02: qty 1

## 2013-05-02 MED ORDER — OXYCODONE-ACETAMINOPHEN 5-325 MG PO TABS
1.0000 | ORAL_TABLET | ORAL | Status: DC | PRN
  Administered 2013-05-02 – 2013-05-03 (×3): 2 via ORAL
  Administered 2013-05-03 (×2): 1 via ORAL
  Administered 2013-05-03 – 2013-05-04 (×2): 2 via ORAL
  Administered 2013-05-04: 1 via ORAL
  Filled 2013-05-02 (×3): qty 2
  Filled 2013-05-02: qty 1
  Filled 2013-05-02 (×4): qty 2

## 2013-05-02 MED ORDER — HYDROMORPHONE HCL PF 1 MG/ML IJ SOLN: 0.5000 mg | INTRAMUSCULAR | Status: DC | PRN

## 2013-05-02 MED ORDER — ROCURONIUM BROMIDE 100 MG/10ML IV SOLN
INTRAVENOUS | Status: DC | PRN
  Administered 2013-05-02: 50 mg via INTRAVENOUS
  Administered 2013-05-02: 10 mg via INTRAVENOUS

## 2013-05-02 MED ORDER — TRAMADOL HCL 50 MG PO TABS: 50.0000 mg | ORAL_TABLET | Freq: Four times a day (QID) | ORAL | Status: DC | PRN

## 2013-05-02 MED ORDER — SODIUM CHLORIDE 0.9 % IJ SOLN
3.0000 mL | Freq: Two times a day (BID) | INTRAMUSCULAR | Status: DC
  Administered 2013-05-02 – 2013-05-04 (×4): 3 mL via INTRAVENOUS

## 2013-05-02 MED ORDER — LACTATED RINGERS IV SOLN
INTRAVENOUS | Status: DC | PRN
  Administered 2013-05-02 (×2): via INTRAVENOUS

## 2013-05-02 MED ORDER — HYDROMORPHONE HCL PF 1 MG/ML IJ SOLN
0.2500 mg | INTRAMUSCULAR | Status: DC | PRN
  Administered 2013-05-02: 0.5 mg via INTRAVENOUS

## 2013-05-02 MED ORDER — GLYCOPYRROLATE 0.2 MG/ML IJ SOLN
INTRAMUSCULAR | Status: DC | PRN
  Administered 2013-05-02: .8 mg via INTRAVENOUS

## 2013-05-02 MED ORDER — 0.9 % SODIUM CHLORIDE (POUR BTL) OPTIME
TOPICAL | Status: DC | PRN
  Administered 2013-05-02: 1000 mL

## 2013-05-02 MED ORDER — VANCOMYCIN HCL IN DEXTROSE 1-5 GM/200ML-% IV SOLN
1000.0000 mg | INTRAVENOUS | Status: AC
  Administered 2013-05-02: 1000 mg via INTRAVENOUS
  Filled 2013-05-02: qty 200

## 2013-05-02 SURGICAL SUPPLY — 66 items
BAG DECANTER FOR FLEXI CONT (MISCELLANEOUS) ×2 IMPLANT
BENZOIN TINCTURE PRP APPL 2/3 (GAUZE/BANDAGES/DRESSINGS) ×2 IMPLANT
BLADE SURG ROTATE 9660 (MISCELLANEOUS) ×2 IMPLANT
BRUSH SCRUB EZ PLAIN DRY (MISCELLANEOUS) ×2 IMPLANT
BUR MATCHSTICK NEURO 3.0 LAGG (BURR) ×2 IMPLANT
CAGE 10X22 (Cage) ×2 IMPLANT
CANISTER SUCTION 2500CC (MISCELLANEOUS) ×2 IMPLANT
CAP LCK SPNE (Orthopedic Implant) ×4 IMPLANT
CAP LOCK SPINE RADIUS (Orthopedic Implant) ×4 IMPLANT
CAP LOCKING (Orthopedic Implant) ×4 IMPLANT
CONT SPEC 4OZ CLIKSEAL STRL BL (MISCELLANEOUS) ×4 IMPLANT
COVER BACK TABLE 24X17X13 BIG (DRAPES) IMPLANT
COVER TABLE BACK 60X90 (DRAPES) ×2 IMPLANT
DECANTER SPIKE VIAL GLASS SM (MISCELLANEOUS) ×2 IMPLANT
DERMABOND ADHESIVE PROPEN (GAUZE/BANDAGES/DRESSINGS) ×1
DERMABOND ADVANCED (GAUZE/BANDAGES/DRESSINGS)
DERMABOND ADVANCED .7 DNX12 (GAUZE/BANDAGES/DRESSINGS) IMPLANT
DERMABOND ADVANCED .7 DNX6 (GAUZE/BANDAGES/DRESSINGS) ×1 IMPLANT
DRAPE C-ARM 42X72 X-RAY (DRAPES) ×4 IMPLANT
DRAPE LAPAROTOMY 100X72X124 (DRAPES) ×2 IMPLANT
DRAPE POUCH INSTRU U-SHP 10X18 (DRAPES) ×2 IMPLANT
DRAPE PROXIMA HALF (DRAPES) IMPLANT
DRAPE SURG 17X23 STRL (DRAPES) ×8 IMPLANT
DRSG OPSITE POSTOP 4X8 (GAUZE/BANDAGES/DRESSINGS) ×2 IMPLANT
DURAPREP 26ML APPLICATOR (WOUND CARE) ×2 IMPLANT
ELECT REM PT RETURN 9FT ADLT (ELECTROSURGICAL) ×2
ELECTRODE REM PT RTRN 9FT ADLT (ELECTROSURGICAL) ×1 IMPLANT
EVACUATOR 1/8 PVC DRAIN (DRAIN) ×2 IMPLANT
GAUZE SPONGE 4X4 16PLY XRAY LF (GAUZE/BANDAGES/DRESSINGS) IMPLANT
GLOVE BIOGEL PI IND STRL 7.0 (GLOVE) ×4 IMPLANT
GLOVE BIOGEL PI IND STRL 8.5 (GLOVE) ×1 IMPLANT
GLOVE BIOGEL PI INDICATOR 7.0 (GLOVE) ×4
GLOVE BIOGEL PI INDICATOR 8.5 (GLOVE) ×1
GLOVE ECLIPSE 8.5 STRL (GLOVE) ×6 IMPLANT
GLOVE EXAM NITRILE LRG STRL (GLOVE) IMPLANT
GLOVE EXAM NITRILE MD LF STRL (GLOVE) ×2 IMPLANT
GLOVE EXAM NITRILE XL STR (GLOVE) IMPLANT
GLOVE EXAM NITRILE XS STR PU (GLOVE) IMPLANT
GLOVE SS BIOGEL STRL SZ 6.5 (GLOVE) ×3 IMPLANT
GLOVE SUPERSENSE BIOGEL SZ 6.5 (GLOVE) ×3
GOWN BRE IMP SLV AUR LG STRL (GOWN DISPOSABLE) ×4 IMPLANT
GOWN BRE IMP SLV AUR XL STRL (GOWN DISPOSABLE) ×4 IMPLANT
GOWN STRL REIN 2XL LVL4 (GOWN DISPOSABLE) ×2 IMPLANT
KIT BASIN OR (CUSTOM PROCEDURE TRAY) ×2 IMPLANT
KIT ROOM TURNOVER OR (KITS) ×2 IMPLANT
MILL MEDIUM DISP (BLADE) ×2 IMPLANT
NEEDLE HYPO 22GX1.5 SAFETY (NEEDLE) ×2 IMPLANT
NS IRRIG 1000ML POUR BTL (IV SOLUTION) ×2 IMPLANT
PACK LAMINECTOMY NEURO (CUSTOM PROCEDURE TRAY) ×2 IMPLANT
ROD RADIUS 40MM (Neuro Prosthesis/Implant) ×2 IMPLANT
ROD SPNL 40X5.5XNS TI RDS (Neuro Prosthesis/Implant) ×2 IMPLANT
SCREW 5.75X40M (Screw) ×4 IMPLANT
SCREW 5.75X45MM (Screw) ×4 IMPLANT
SPONGE GAUZE 4X4 12PLY (GAUZE/BANDAGES/DRESSINGS) ×2 IMPLANT
SPONGE SURGIFOAM ABS GEL 100 (HEMOSTASIS) ×2 IMPLANT
STRIP CLOSURE SKIN 1/2X4 (GAUZE/BANDAGES/DRESSINGS) ×4 IMPLANT
SUT VIC AB 0 CT1 18XCR BRD8 (SUTURE) ×1 IMPLANT
SUT VIC AB 0 CT1 8-18 (SUTURE) ×1
SUT VIC AB 2-0 CT1 18 (SUTURE) ×2 IMPLANT
SUT VIC AB 3-0 SH 8-18 (SUTURE) ×2 IMPLANT
SYR 20ML ECCENTRIC (SYRINGE) ×2 IMPLANT
TOWEL OR 17X24 6PK STRL BLUE (TOWEL DISPOSABLE) ×2 IMPLANT
TOWEL OR 17X26 10 PK STRL BLUE (TOWEL DISPOSABLE) ×2 IMPLANT
TRAY FOLEY CATH 14FRSI W/METER (CATHETERS) ×2 IMPLANT
WATER STERILE IRR 1000ML POUR (IV SOLUTION) ×2 IMPLANT
WEDGE TANGENT 10X26MM ×2 IMPLANT

## 2013-05-02 NOTE — Progress Notes (Signed)
Orthopedic Tech Progress Note Patient Details:  [name] [birth year] [id number] Brace order for patient and then canceled. Patient already has a brace that was left in PACU. Patient now has brace in room.  Patient ID: [name], female   DOB: [birth year], 57 y.o.   MRN: [id number]   [name] 05/02/2013, 1:22 PM

## 2013-05-02 NOTE — H&P (Signed)
[name] is an 57 y.o. female.   Chief Complaint: Back and left leg pain HPI: 57 year old female with progressive back and bilateral lower extremity pain and weakness left greater than right. Workup demonstrates evidence of a grade 1 L4-5 degenerative spondylolisthesis with marked stenosis and associated the left-sided L4-5 synovial cyst. Patient's failed conservative management and presents now for L4-5 decompression and fusion surgery in hopes of improving her symptoms.  Past Medical History  Diagnosis Date  . IBS (irritable bowel syndrome)     constipation predominant  . Interstitial cystitis   . Lupus   . Hemorrhoids   . Allergy   . Blood transfusion without reported diagnosis     x3  . Heart murmur   . Osteoporosis     osteopenia  . Anxiety     takes Klonopin nighlty  . GERD (gastroesophageal reflux disease)     takes Omeprazole daily  . Nausea     takes Zofran prn  . Insomnia     takes Ambien prn  . Depression     takes Effexor daily  . PONV (postoperative nausea and vomiting)     laryngospasm-1999  . History of bronchitis     winter of 2014  . Pneumonia     hx of;last time 70yrs ago  . History of migraine     last one a yr ago and takes Zomig prn  . Seasonal allergies     Flonase daily   . Numbness     both legs and related to back  . Arthritis     uses Diclofenac gel;Rheumatoid  . Chronic back pain   . Joint pain   . Joint swelling   . Eczema   . Chronic constipation     Miralax once weekly  . History of kidney stones     passed on her own  . History of bladder infections   . Anemia     after birth of child 50yrs ago    Past Surgical History  Procedure Laterality Date  . Abdominal hysterectomy  2007  . Tonsillectomy    . Excision morton's neuroma Right   . Colonoscopy  12/24/04    internal and external hemorrhoids  . Flexible sigmoidoscopy  03/07/08    internal and external hemorrhoids  . Upper gastrointestinal endoscopy  10/23/10     gastritis, irregular Z-line  . Wisdom teeth extracted    . Uterine ablation    . Colonoscopy      Family History  Problem Relation Age of Onset  . Coronary artery disease Father   . Diabetes      uncle  . Diverticulosis Mother   . Hypertension Mother   . Osteoarthritis Mother   . Colon cancer Neg Hx    Social History:  reports that she has never smoked. She has never used smokeless tobacco. She reports that she drinks about 0.5 ounces of alcohol per week. She reports that she does not use illicit drugs.  Allergies:  Allergies  Allergen Reactions  . Aspartame And Phenylalanine Other (See Comments)    Migraines  . Beef-Derived Products Other (See Comments)    severe cramping  . Citrus Other (See Comments)    Causes Interstitial cystitis flares  . Penicillins Hives    Denies airway involvement  . Promethazine Hcl Other (See Comments)    Muscle twitching and contracture   . Adhesive [Tape]     Red splotches  . Doxycycline Nausea And Vomiting  . Morphine  Sulfate Nausea And Vomiting    Medications Prior to Admission  Medication Sig Dispense Refill  . calcium-vitamin D (OSCAL WITH D) 500-200 MG-UNIT per tablet Take 2 tablets by mouth every evening.      . cholecalciferol (VITAMIN D) 1000 UNITS tablet Take 1,000 Units by mouth every evening.      . clonazePAM (KLONOPIN) 1 MG tablet Take 0.5 mg by mouth as needed for anxiety. For sleep/ anxiety      . clonazePAM (KLONOPIN) 1 MG tablet Take 1 mg by mouth at bedtime.      . diclofenac sodium (VOLTAREN) 1 % GEL Apply 2 g topically 4 (four) times daily as needed (to hip and back for arthritic pain). For arthritic pain      . estradiol (ESTRACE) 1 MG tablet Take 1 mg by mouth every other day.      . fluticasone (FLONASE) 50 MCG/ACT nasal spray Place 2 sprays into the nose daily.  16 g  5  . ibuprofen (ADVIL,MOTRIN) 200 MG tablet Take 400 mg by mouth every 6 (six) hours as needed. For pain      . omeprazole (PRILOSEC) 40 MG capsule  Take 40 mg by mouth every morning.      . sucralfate (CARAFATE) 1 GM/10ML suspension Take 10 mLs (1 g total) by mouth 3 (three) times daily as needed. For digestion  840 mL  5  . traMADol (ULTRAM) 50 MG tablet Take 50 mg by mouth every 6 (six) hours as needed for pain.      Marland Kitchen venlafaxine (EFFEXOR) 75 MG tablet Take 225 mg by mouth daily.      Marland Kitchen zolpidem (AMBIEN) 10 MG tablet Take 1 tablet (10 mg total) by mouth at bedtime as needed for sleep.  30 tablet  0  . albuterol (PROVENTIL HFA;VENTOLIN HFA) 108 (90 BASE) MCG/ACT inhaler Inhale 2 puffs into the lungs every 6 (six) hours as needed for wheezing.  1 Inhaler  0  . Multiple Vitamin (MULTIVITAMIN WITH MINERALS) TABS Take 1 tablet by mouth every evening.      . ondansetron (ZOFRAN-ODT) 4 MG disintegrating tablet Take 4 mg by mouth every 8 (eight) hours as needed for nausea.      . sodium chloride 0.9 % SOLN 250 mL with inFLIXimab 100 MG SOLR 5 mg/kg Inject 5 mg/kg into the vein every 28 (twenty-eight) days. Sherrian Divers rheumatologist  medical [facility] [place] is remicade is received      . zolmitriptan (ZOMIG) 5 MG tablet Take 5 mg by mouth as needed for migraine.        No results found for this or any previous visit (from the past 48 hour(s)). No results found.  Review of Systems  Constitutional: Negative.   HENT: Negative.   Eyes: Negative.   Respiratory: Negative.   Cardiovascular: Negative.   Gastrointestinal: Negative.   Genitourinary: Negative.   Musculoskeletal: Negative.   Skin: Negative.   Neurological: Negative.   Endo/Heme/Allergies: Negative.   Psychiatric/Behavioral: Negative.     Blood pressure 127/86, pulse 70, temperature 97.2 F (36.2 C), temperature source Oral, resp. rate 14, SpO2 97.00%. Physical Exam  Constitutional: She is oriented to person, place, and time. She appears well-developed and well-nourished. No distress.  HENT:  Head: Normocephalic and atraumatic.  Right Ear: External ear normal.   Left Ear: External ear normal.  Nose: Nose normal.  Mouth/Throat: Oropharynx is clear and moist. No oropharyngeal exudate.  Eyes: Conjunctivae and EOM are normal. Pupils are  equal, round, and reactive to light. Right eye exhibits no discharge. Left eye exhibits no discharge. No scleral icterus.  Neck: Normal range of motion. Neck supple. No tracheal deviation present. No thyromegaly present.  Cardiovascular: Normal rate, regular rhythm, normal heart sounds and intact distal pulses.   Respiratory: Effort normal and breath sounds normal. No respiratory distress. She has no wheezes.  GI: Soft. Bowel sounds are normal. She exhibits no distension. There is no tenderness.  Musculoskeletal: Normal range of motion. She exhibits no edema and no tenderness.  Neurological: She is alert and oriented to person, place, and time. She has normal reflexes. She displays normal reflexes. No cranial nerve deficit. She exhibits normal muscle tone. Coordination normal.  Skin: Skin is warm and dry. No rash noted. She is not diaphoretic. No erythema.  Psychiatric: She has a normal mood and affect. Her behavior is normal. Judgment and thought content normal.     Assessment/Plan L4-5 grade 1 degenerative spondylolisthesis with stenosis and neurogenic claudication. Plan L4-5 decompressive laminectomy with foraminotomies followed by posterior lumbar interbody fusion utilizing tangent interbody allograft wedge Telamon interbody peek cage and local autograft coupled with posterior lateral arthrodesis utilizing nonsegmental pedicle screw instrumentation. Risks and benefits have been explained. Patient wishes to proceed.  Marquelle Musgrave A 05/02/2013, 7:44 AM

## 2013-05-02 NOTE — Anesthesia Procedure Notes (Signed)
Procedure Name: Intubation Date/Time: 05/02/2013 7:58 AM Performed by: Armandina Gemma Pre-anesthesia Checklist: Patient identified, Timeout performed, Emergency Drugs available, Suction available and Patient being monitored Patient Re-evaluated:Patient Re-evaluated prior to inductionOxygen Delivery Method: Circle system utilized Preoxygenation: Pre-oxygenation with 100% oxygen Intubation Type: IV induction Ventilation: Mask ventilation without difficulty Laryngoscope Size: Miller and 2 Grade View: Grade I Tube type: Oral Tube size: 7.0 mm Number of attempts: 1 Airway Equipment and Method: Stylet Placement Confirmation: ETT inserted through vocal cords under direct vision,  breath sounds checked- equal and bilateral and positive ETCO2 Secured at: 22 cm Tube secured with: Tape Dental Injury: Teeth and Oropharynx as per pre-operative assessment  Comments: IV induction Hodierene- intubation AM CRNA- atraumatic teeth and mouth as preop- temporary bonding front teeth and crowns in back as per patient- all intact

## 2013-05-02 NOTE — Brief Op Note (Signed)
05/02/2013  9:54 AM  PATIENT:  [name]  57 y.o. female  PRE-OPERATIVE DIAGNOSIS:  spondylolisthesis  POST-OPERATIVE DIAGNOSIS:  aspondylolisthesis  PROCEDURE:  Procedure(s): LUMBAR FOUR-FIVE POSTERIOR LUMBAR INTERBODY FUSION  (N/A)  SURGEON:  Surgeon(s) and Role:    * Temple Pacini, MD - Primary    * Barnett Abu, MD - Assisting  PHYSICIAN ASSISTANT:   ASSISTANTS:    ANESTHESIA:   general  EBL:  Total I/O In: 1390 [I.V.:1000; Blood:140; IV Piggyback:250] Out: 390 [Urine:40; Blood:350]  BLOOD ADMINISTERED:none  DRAINS: (Medium) Hemovact drain(s) in the Epidural space with  Suction Open   LOCAL MEDICATIONS USED:  MARCAINE     SPECIMEN:  No Specimen  DISPOSITION OF SPECIMEN:  N/A  COUNTS:  YES  TOURNIQUET:  * No tourniquets in log *  DICTATION: .Dragon Dictation  PLAN OF CARE: Admit to inpatient   PATIENT DISPOSITION:  PACU - hemodynamically stable.   Delay start of Pharmacological VTE agent (>24hrs) due to surgical blood loss or risk of bleeding: yes

## 2013-05-02 NOTE — Anesthesia Postprocedure Evaluation (Signed)
Anesthesia Post Note  Patient: [name]  Procedure(s) Performed: Procedure(s) (LRB): LUMBAR FOUR-FIVE POSTERIOR LUMBAR INTERBODY FUSION  (N/A)  Anesthesia type: General  Patient location: PACU  Post pain: Pain level controlled and Adequate analgesia  Post assessment: Post-op Vital signs reviewed, Patient's Cardiovascular Status Stable, Respiratory Function Stable, Patent Airway and Pain level controlled  Last Vitals:  Filed Vitals:   05/02/13 1012  BP: 109/66  Pulse: 83  Temp: 36.2 C  Resp: 16    Post vital signs: Reviewed and stable  Level of consciousness: awake, alert  and oriented  Complications: No apparent anesthesia complications

## 2013-05-02 NOTE — Preoperative (Signed)
Beta Blockers   Reason not to administer Beta Blockers:Not Applicable 

## 2013-05-02 NOTE — Anesthesia Preprocedure Evaluation (Addendum)
Anesthesia Evaluation  Patient identified by MRN, date of birth, ID band Patient awake    Reviewed: Allergy & Precautions, H&P , NPO status , Patient's Chart, lab work & pertinent test results  History of Anesthesia Complications (+) PONV  Airway Mallampati: II  Neck ROM: full    Dental  (+) Dental Advisory Given and Caps   Pulmonary          Cardiovascular hypertension, + Peripheral Vascular Disease     Neuro/Psych Anxiety Depression  Neuromuscular disease    GI/Hepatic GERD-  ,IBS   Endo/Other    Renal/GU      Musculoskeletal  (+) Arthritis -, Rheumatoid disorders,    Abdominal   Peds  Hematology   Anesthesia Other Findings   Reproductive/Obstetrics                          Anesthesia Physical Anesthesia Plan  ASA: III  Anesthesia Plan: General   Post-op Pain Management:    Induction: Intravenous  Airway Management Planned: Oral ETT  Additional Equipment:   Intra-op Plan:   Post-operative Plan: Extubation in OR  Informed Consent: I have reviewed the patients History and Physical, chart, labs and discussed the procedure including the risks, benefits and alternatives for the proposed anesthesia with the patient or authorized representative who has indicated his/her understanding and acceptance.     Plan Discussed with: CRNA, Anesthesiologist and Surgeon  Anesthesia Plan Comments:         Anesthesia Quick Evaluation

## 2013-05-02 NOTE — Evaluation (Addendum)
Occupational Therapy Evaluation Patient Details Name: ANGLES TREVIZO MRN: [id number] DOB: [birth year] Today's Date: 05/02/2013 Time: 1478-2956 OT Time Calculation (min): 26 min  OT Assessment / Plan / Recommendation History of present illness L4-5 decompressive laminectomy with bilateral L4 and L5 decompressive foraminotomies to relieve symptomatic nerve root compression at L4 and L5; more than would be required for simple interbody fusion alone.   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    OT Assessment  Patient needs continued OT Services    Follow Up Recommendations  No OT follow up       Equipment Recommendations  3 in 1 bedside comode       Frequency  Min 2X/week    Precautions / Restrictions Precautions Precautions: Back Precaution Booklet Issued: Yes (comment) Required Braces or Orthoses: Spinal Brace Spinal Brace: Applied in sitting position Restrictions Weight Bearing Restrictions: No   Pertinent Vitals/Pain 5/10 back; pre-medicated    ADL  Eating/Feeding: Independent Where Assessed - Eating/Feeding: Bed level Grooming: Set up;Supervision/safety Where Assessed - Grooming: Unsupported sitting Upper Body Bathing: Set up;Supervision/safety Where Assessed - Upper Body Bathing: Unsupported sitting Lower Body Bathing: Maximal assistance Where Assessed - Lower Body Bathing: Supported sit to stand Upper Body Dressing: Minimal assistance Where Assessed - Upper Body Dressing: Unsupported sitting Lower Body Dressing: Maximal assistance Where Assessed - Lower Body Dressing: Supported sit to stand Equipment Used: Gait belt;Rolling walker;Back brace Transfers/Ambulation Related to ADLs: Min A sit<>stand x2 (first attempt pt reported she felt light headed so I had her sit back down, breath and tap her toes; second attempt pt could not tell me she felt light headed  before she passed out--sitting back down on the bed)    OT Diagnosis: Generalized weakness;Acute pain  OT Problem List: Decreased strength;Decreased activity tolerance;Impaired balance (sitting and/or standing);Pain;Decreased knowledge of precautions;Decreased knowledge of use of DME or AE OT Treatment Interventions: Self-care/ADL training;Balance training;DME and/or AE instruction;Patient/family education   OT Goals(Current goals can be found in the care plan section) Acute Rehab OT Goals OT Goal Formulation: With patient Time For Goal Achievement: 05/09/13 Potential to Achieve Goals: Good  Visit Information  Last OT Received On: 05/02/13 Assistance Needed: +1 History of Present Illness: L4-5 decompressive laminectomy with bilateral L4 and L5 decompressive foraminotomies to relieve symptomatic nerve root compression at L4 and L5; more than would be required for simple interbody fusion alone.       Prior Functioning     Home Living Family/patient expects to be discharged to:: Private residence Living Arrangements: Spouse/significant other;Children Available Help at Discharge: Family;Available 24 hours/day Type of Home: House Home Access: Stairs to enter [employer] of Steps: 2 Entrance Stairs-Rails: None Home Layout: One level Home Equipment: None Prior Function Level of Independence: Independent Communication Communication: No difficulties Dominant Hand: Right  Walk in shower with built in seat        Vision/Perception Vision - History Patient Visual Report: No change from baseline   Cognition  Cognition Arousal/Alertness: Lethargic Behavior During Therapy: WFL for tasks assessed/performed Overall Cognitive Status: Within Functional Limits for tasks assessed    Extremity/Trunk Assessment Upper Extremity Assessment Upper Extremity Assessment: Overall WFL for tasks assessed     Mobility Bed Mobility Bed Mobility: Rolling Left;Rolling Right;Left  Sidelying to Sit;Sitting - Scoot to Edge of Bed Rolling Right: 6: Modified independent (Device/Increase time);With rail Rolling Left: 6: Modified independent (Device/Increase  time);With rail Left Sidelying to Sit: 4: Min assist;With rails;HOB flat Sitting - Scoot to Edge of Bed: 5: Supervision Transfers Transfers: Sit to Stand;Stand to Sit Sit to Stand: 4: Min assist;With upper extremity assist;From bed Stand to Sit: 4: Min assist;With upper extremity assist;To bed Details for Transfer Assistance: VCs for safe hand placement           End of Session OT - End of Session Equipment Utilized During Treatment: Gait belt;Rolling walker;Back brace Activity Tolerance: Patient limited by lethargy (limited by passing out) Patient left: in bed;with call bell/phone within reach;with family/visitor present Nurse Communication:  (Pt passed out)       Evette Georges [phone] 05/02/2013, 3:06 PM

## 2013-05-02 NOTE — Progress Notes (Signed)
ANTIBIOTIC CONSULT NOTE - INITIAL  Pharmacy Consult for Vancomycin  Indication: Surgical prophylaxis   Allergies  Allergen Reactions  . Aspartame And Phenylalanine Other (See Comments)    Migraines  . Beef-Derived Products Other (See Comments)    severe cramping  . Citrus Other (See Comments)    Causes Interstitial cystitis flares  . Penicillins Hives    Denies airway involvement  . Promethazine Hcl Other (See Comments)    Muscle twitching and contracture   . Adhesive [Tape]     Red splotches  . Doxycycline Nausea And Vomiting  . Morphine Sulfate Nausea And Vomiting    Patient Measurements: Height: 5' 2.99" (160 cm) Weight: 117 lb 15.1 oz (53.5 kg) IBW/kg (Calculated) : 52.38  Vital Signs: Temp: 97.6 F (36.4 C) (10/20 1130) Temp src: Oral (10/20 0613) BP: 121/82 mmHg (10/20 1130) Pulse Rate: 77 (10/20 1130) Intake/Output from previous day:   Intake/Output from this shift: Total I/O In: 1940 [I.V.:1550; Blood:140; IV Piggyback:250] Out: 500 [Urine:100; Drains:50; Blood:350]  Labs: No results found for this basename: WBC, HGB, PLT, LABCREA, CREATININE,  in the last 72 hours Estimated Creatinine Clearance: 65 ml/min (by C-G formula based on Cr of 0.79). No results found for this basename: VANCOTROUGH, Leodis Binet, VANCORANDOM, GENTTROUGH, GENTPEAK, GENTRANDOM, TOBRATROUGH, TOBRAPEAK, TOBRARND, AMIKACINPEAK, AMIKACINTROU, AMIKACIN,  in the last 72 hours   Microbiology: Recent Results (from the past 720 hour(s))  SURGICAL PCR SCREEN     Status: None   Collection Time    04/20/13 11:44 AM      Result Value Range Status   MRSA, PCR NEGATIVE  NEGATIVE Final   Staphylococcus aureus NEGATIVE  NEGATIVE Final   Comment:            The Xpert SA Assay (FDA     approved for NASAL specimens     in patients over 18 years of age),     is one component of     a comprehensive surveillance     program.  Test performance has     been validated by [employer] for patients  greater     than or equal to 45 year old.     It is not intended     to diagnose infection nor to     guide or monitor treatment.    Medical History: Past Medical History  Diagnosis Date  . IBS (irritable bowel syndrome)     constipation predominant  . Interstitial cystitis   . Lupus   . Hemorrhoids   . Allergy   . Blood transfusion without reported diagnosis     x3  . Heart murmur   . Osteoporosis     osteopenia  . Anxiety     takes Klonopin nighlty  . GERD (gastroesophageal reflux disease)     takes Omeprazole daily  . Nausea     takes Zofran prn  . Insomnia     takes Ambien prn  . Depression     takes Effexor daily  . PONV (postoperative nausea and vomiting)     laryngospasm-1999  . History of bronchitis     winter of 2014  . Pneumonia     hx of;last time 65yrs ago  . History of migraine     last one a yr ago and takes Zomig prn  . Seasonal allergies     Flonase daily   . Numbness     both legs and related to back  . Arthritis  uses Diclofenac gel;Rheumatoid  . Chronic back pain   . Joint pain   . Joint swelling   . Eczema   . Chronic constipation     Miralax once weekly  . History of kidney stones     passed on her own  . History of bladder infections   . Anemia     after birth of child 89yrs ago    Assessment: [name] s/p L4-5 posterior lumbar fusion, with hemovac, allergic to penicillin. Pharmacy is consulted to dose vancomycin for surgical prophylaxis. Pre-op vancomycin 750 mg was given at ~ 0800. Scr 0.79 on 10/8, est. crcl 65 ml/min. Afebrile, MRSA PCR negative.   Goal of Therapy:  Vancomycin trough level 15-20 mcg/ml  Plan:  - Start vancomycin 750g IV Q 12hrs, next dose @ 2000 - f/u drain status, and clinical course - vancomycin trough if continues for > 72 hrs  Bayard Hugger, PharmD, BCPS  Clinical Pharmacist  Pager: [phone]  05/02/2013,2:43 PM

## 2013-05-02 NOTE — Transfer of Care (Signed)
Immediate Anesthesia Transfer of Care Note  Patient: [name]  Procedure(s) Performed: Procedure(s): LUMBAR FOUR-FIVE POSTERIOR LUMBAR INTERBODY FUSION  (N/A)  Patient Location: PACU  Anesthesia Type:General  Level of Consciousness: sedated  Airway & Oxygen Therapy: Patient Spontanous Breathing and Patient connected to nasal cannula oxygen  Post-op Assessment: Report given to PACU RN and Post -op Vital signs reviewed and stable  Post vital signs: Reviewed and stable  Complications: No apparent anesthesia complications

## 2013-05-02 NOTE — Op Note (Signed)
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   L4-5 posterior lumbar interbody fusion utilizing tangent interbody allograft wedge, Telamon interbody peek cage, and local autograft.  L4-5 posterior lateral arthrodesis utilizing nonsegmental pedicle screw instrumentation and local autograft.  Surgeon:Gearld Kerstein A.Terre Hanneman, M.D.  Asst. Surgeon: Danielle Dess  Anesthesia: General  Indication: The patient is a 57 year old female with back and bilateral lower extremity symptoms left greater than right failing conservative management. Workup demonstrates evidence of a grade 1 L4-5 degenerative spondylolisthesis with marked stenosis. Patient presents now for L4-5 decompression and fusion.  Operative note: After induction of anesthesia, patient was positioned prone onto Wilson frame and appropriately padded. Lumbar region prepped and draped. Incision made overlying L4-5 interspace. Dissection performed bilaterally exposing the lamina and facet joints and transverse processes of L4 and L5. Retractor placed. Fluoroscopy used. Level confirmed. Decompressive laminectomy performed using Leksell rongeurs, Kerrison rongeurs, and the high-speed drill to remove the entire lamina of L4, bilateral inferior facets of L4, superior facets of L5, and the superior aspect of lamina of L5. All bone was cleaned in use and later autograft. Ligamentum flavum and epidural scar including element of a left-sided synovial cyst was elevated and resected using Kerrison rongeurs. Very wide decompressive  foraminotomies were performed along the course the exiting L4 and L5 nerve roots bilaterally. Bilateral discectomies were then performed at L4-5. Disc space was then distracted to 10 mm with a 10 mm distractor left in the patient's left side. Thecal sac and nerve roots protected on the right side. The spaces and reamed and cut with 10 mm tangent instruments. Soft tissue was removed and interspace. 10 x 22 mm home on cage packed with morselized autograft was packed in the interspace and recessed approximately 1-2 mm from the posterior cortical margin of L4. Distractor was removed and patient's left side. Thecal sac and nerve respect on the left side. The space was again reamed and then cut with 10 mm tangent instruments. Soft tissue was removed and interspace. The spaces further curettage. Morselize autograft was impacted interspace for later fusion. 10 x 20 60 m tangent wedge was then impacted in place and recessed roughly 1 mm from the posterior cortical margin of L4. Pedicles of L4 and L5 were identified using surface landmarks and intraoperative fluoroscopy. Superficial bone was removed overlying the pedicles at L4 and L5. Each pedicle was then probed using a pedicle awl. Each pedicle awl track was then tapped with a 5.2 500 screw tap. Each screw tap hole was probed and found to be solidly within bone. 5.75 mm radius brand screws were placed bilaterally at L4 and L5. Transverse processes of L4 and L5 were then decorticated using high-speed drill. Morselized autograft was packed posterior laterally for later fusion. Short segment titanium rods and placed over the screw heads at L4 and L5. Locking caps were then placed over the screw heads. Locking catching and engaged with the construct under compression. Final images revealed good position of the bone graft and hardware at the proper operative level with normal alignment of the spine. Wound is then irrigated one final time. Medium Hemovac drain was left in  epidural space.  Gelfoam was placed topically for hemostasis. Wounds and close in layers with Vicryl sutures. Steri-Strips and a sterile dressing were applied. There were no apparent complications. Patient tolerated the procedure well and she returns to the recovery room postop.

## 2013-05-02 NOTE — Progress Notes (Signed)
BOrthopedic Tech Progress Note Patient Details:  [name] 04/20/1956 [id number] [employer] called for brace order Patient ID: [name], female   DOB: 1955/11/11, 57 y.o.   MRN: [id number]   [name] 05/02/2013, 12:03 PM

## 2013-05-03 NOTE — Progress Notes (Signed)
PT Cancellation Note  Patient Details Name: [name] MRN: [id number] DOB: [birth year]   Cancelled Treatment:    Reason Eval/Treat Not Completed: Medical issues which prohibited therapy (low BP, syncopal episode with OT).  PT to check back after lunch.     Rollene Rotunda [name], PT, DPT [phone]   05/03/2013, 9:58 AM

## 2013-05-03 NOTE — Progress Notes (Signed)
Occupational Therapy Treatment Patient Details Name: [name] MRN: [id number] DOB: Dec 28, 1955 Today's Date: 05/03/2013 Time: 4098-1191 OT Time Calculation (min): 25 min  OT Assessment / Plan / Recommendation  History of present illness L4-5 decompressive laminectomy with bilateral L4 and L5 decompressive foraminotomies to relieve symptomatic nerve root compression at L4 and L5; more than would be required for simple interbody fusion alone.   OT comments  Pt limited this session due to decr BP 87/61 passing out in bathroom. Pt unable to tolerate prolonged standing. Pt needs continued education on Adls with back precautions. Session limited due to decr arousal. Recommending HHOT at this time and 3n1.   Follow Up Recommendations  Home health OT    Barriers to Discharge       Equipment Recommendations  3 in 1 bedside comode    Recommendations for Other Services    Frequency Min 2X/week   Progress towards OT Goals Progress towards OT goals: Not progressing toward goals - comment  Plan Discharge plan needs to be updated    Precautions / Restrictions Precautions Precautions: Back Precaution Comments: handout present in room Required Braces or Orthoses: Spinal Brace Spinal Brace: Applied in sitting position   Pertinent Vitals/Pain Reports pain and requesting pain medication Pt supine with ice applied to back  RN Morrie Sheldon present in room and made aware of decr arousal (passing out) during session Husband pushing call bell for staff upon OT request    ADL  Grooming: Min guard;Wash/dry hands (while washing hands started feeling faint) Where Assessed - Grooming: Unsupported standing Toilet Transfer: Minimal assistance Toilet Transfer Method: Sit to stand Toilet Transfer Equipment: Regular height toilet;Grab bars Toileting - Clothing Manipulation and Hygiene: Minimal assistance Where Assessed - Toileting Clothing Manipulation and Hygiene: Sit to stand from 3-in-1 or  toilet Equipment Used: Back brace;Rolling walker Transfers/Ambulation Related to ADLs: PT ambulated to bathroom min (A). pt backing into bathroom with Min (A) for RW. Pt s/p void static standing for hand hygiene. Pt verbalized feeling light headed and passed out. Therapist total (A) to toilet. Pt sitting for ~45 seconds with patient arousing ADL Comments: Pt don brace MOD I at eob. Pt requesting to void bladder. Pt passing out with eyes deviated left with slight beating. Pt required sitting position for ~45 seconds to arouse. Pt assisted to w/c then to bed. Pt with bp decr to 80s over 60s with mobility.     OT Diagnosis:    OT Problem List:   OT Treatment Interventions:     OT Goals(current goals can now be found in the care plan section) Acute Rehab OT Goals OT Goal Formulation: With patient Time For Goal Achievement: 05/09/13 Potential to Achieve Goals: Good ADL Goals Pt Will Perform Lower Body Dressing: with set-up;with supervision;with adaptive equipment;sit to/from stand Pt Will Transfer to Toilet: with supervision;ambulating;regular height toilet Pt Will Perform Toileting - Clothing Manipulation and hygiene: with supervision;sit to/from stand Pt Will Perform Tub/Shower Transfer: with supervision;Shower transfer;ambulating;shower seat Additional ADL Goal #1: Pt will be able to don/doff brace independently Additional ADL Goal #2: Pt will be able to state 3/3 back precautions Additional ADL Goal #3: Pt will be Mod I in and OOB for BADLs  Visit Information  Last OT Received On: 05/03/13 Assistance Needed: +1 (check BP) History of Present Illness: L4-5 decompressive laminectomy with bilateral L4 and L5 decompressive foraminotomies to relieve symptomatic nerve root compression at L4 and L5; more than would be required for simple interbody fusion alone.  Subjective Data      Prior Functioning       Cognition  Cognition Arousal/Alertness: Lethargic Behavior During Therapy: WFL  for tasks assessed/performed Overall Cognitive Status: Within Functional Limits for tasks assessed    Mobility  Bed Mobility Bed Mobility: Sit to Supine Sit to Supine: 2: Max assist;HOB elevated;With rail Details for Bed Mobility Assistance: pt sitting on eob on arrival. pt (A) supine after passing out. Pt positioned in side lying and ice pack applied to back Transfers Transfers: Sit to Stand;Stand to Sit Sit to Stand: 4: Min assist;With upper extremity assist;From bed Stand to Sit: 3: Mod assist;With upper extremity assist;To toilet Details for Transfer Assistance: pt needed (A) for hand placement and from low surface. Recommend 3n1    Exercises      Balance     End of Session OT - End of Session Activity Tolerance: Patient limited by lethargy Patient left: in bed;with call bell/phone within reach;with family/visitor present Nurse Communication: Mobility status;Precautions  GO     [name] 05/03/2013, 9:47 AM Pager: [phone]

## 2013-05-03 NOTE — Progress Notes (Signed)
Postop day 1. Patient with some transient symptomatic hypotension earlier today which is now resolved. Currently denies any orthostasis. No chest pain. For shortness of breath. Back and leg pain much improved from preop.  Heart rate normal. Blood pressure 99/64. Abdomen soft. Chest clear. Motor and sensory function extremities normal. Wound clean and dry.  Progressing reasonably well. Transient hypotension likely secondary to perioperative fluid shifts and reaction to lumbar dissection. I doubt significant hypovolemia particularly given her good urine output. Continue efforts at mobilization. Probable discharge tomorrow.

## 2013-05-03 NOTE — Evaluation (Signed)
Physical Therapy Evaluation Patient Details Name: [name] MRN: [id number] DOB: 09/20/1955 Today's Date: 05/03/2013 Time: 2841-3244 PT Time Calculation (min): 20 min  PT Assessment / Plan / Recommendation History of Present Illness  L4-5 decompressive laminectomy with bilateral L4 and L5 decompressive foraminotomies to relieve symptomatic nerve root compression at L4 and L5; more than would be required for simple interbody fusion alone.  Clinical Impression  Pt is POD #1 lumbar spine surgery and since she has had a bag of IV fluids she has not felt lightheaded when getting up and moving around.  PT to see in AM to make sure stairs look good and to reinforce education from today.  She should progress well enough to go home with her husband's assist.  She may or may not need RW.  We will reassess tomorrow.      PT Assessment  Patient needs continued PT services    Follow Up Recommendations  No PT follow up;Supervision/Assistance - 24 hour    Does the patient have the potential to tolerate intense rehabilitation     NA  Barriers to Discharge   None      Equipment Recommendations  Rolling walker with 5" wheels    Recommendations for Other Services   None  Frequency Min 5X/week    Precautions / Restrictions Precautions Precautions: Back Precaution Booklet Issued: Yes (comment) Precaution Comments: reviewed back precautions, brace use, lifting restrictions with pt and her husband Required Braces or Orthoses: Spinal Brace Spinal Brace: Applied in sitting position   Pertinent Vitals/Pain See vitals flow sheet.       Mobility  Bed Mobility Bed Mobility: Rolling Right;Right Sidelying to Sit;Sitting - Scoot to Edge of Bed;Sit to Sidelying Right Rolling Right: 5: Supervision;With rail Right Sidelying to Sit: 5: Supervision;With rails;HOB elevated Sitting - Scoot to Edge of Bed: 5: Supervision;With rail Sit to Sidelying Right: 5: Supervision;HOB flat;With rail Details for  Bed Mobility Assistance: supervision for safety cue for log roll technique Transfers Transfers: Sit to Stand;Stand to Sit Sit to Stand: 4: Min guard;With upper extremity assist;With armrests;From bed Stand to Sit: 4: Min guard;With upper extremity assist;With armrests;To bed Details for Transfer Assistance: min guard assist for safety Ambulation/Gait Ambulation/Gait Assistance: 4: Min guard Ambulation Distance (Feet): 150 Feet Assistive device: Rolling walker Ambulation/Gait Assistance Details: min guard assist for safety due to events of this morning.   Gait Pattern: Step-through pattern;Shuffle Gait velocity: decreased General Gait Details: guarded due to pain, no reports or signs of syncope    Exercises Other Exercises Other Exercises: educated re: getting up and moving around every 30-45 mins while awake at home and for walking to be her main form of activity.     PT Diagnosis: Difficulty walking;Abnormality of gait;Acute pain;Generalized weakness  PT Problem List: Decreased strength;Decreased activity tolerance;Decreased balance;Decreased mobility;Decreased knowledge of use of DME;Decreased knowledge of precautions;Pain PT Treatment Interventions: DME instruction;Gait training;Stair training;Functional mobility training;Therapeutic activities;Therapeutic exercise;Balance training;Neuromuscular re-education;Patient/family education;Modalities     PT Goals(Current goals can be found in the care plan section) Acute Rehab PT Goals Patient Stated Goal: to get back home, return to work when she can PT Goal Formulation: With patient/family Time For Goal Achievement: 05/10/13 Potential to Achieve Goals: Good  Visit Information  Last PT Received On: 05/03/13 Assistance Needed: +1 History of Present Illness: L4-5 decompressive laminectomy with bilateral L4 and L5 decompressive foraminotomies to relieve symptomatic nerve root compression at L4 and L5; more than would be required for  simple interbody fusion alone.  Prior Functioning  Home Living Family/patient expects to be discharged to:: Private residence Living Arrangements: Spouse/significant other;Children Available Help at Discharge: Family;Available 24 hours/day Type of Home: House Home Access: Stairs to enter [employer] of Steps: 2 Entrance Stairs-Rails: None Home Layout: One level Home Equipment: None Prior Function Level of Independence: Independent Comments: works as Charity fundraiser in [employer], television/film/video: No difficulties Dominant Hand: Right    Cognition  Cognition Arousal/Alertness: Lethargic Behavior During Therapy: WFL for tasks assessed/performed Overall Cognitive Status: Within Functional Limits for tasks assessed    Extremity/Trunk Assessment Upper Extremity Assessment Upper Extremity Assessment: Defer to OT evaluation Lower Extremity Assessment Lower Extremity Assessment: Generalized weakness Cervical / Trunk Assessment Cervical / Trunk Assessment: Normal   Balance Balance Balance Assessed: Yes Static Sitting Balance Static Sitting - Balance Support: Bilateral upper extremity supported Static Sitting - Level of Assistance: 5: Stand by assistance Static Standing Balance Static Standing - Balance Support: Bilateral upper extremity supported Static Standing - Level of Assistance: 5: Stand by assistance Dynamic Standing Balance Dynamic Standing - Balance Support: Bilateral upper extremity supported Dynamic Standing - Level of Assistance: 4: Min assist  End of Session PT - End of Session Equipment Utilized During Treatment: Back brace Activity Tolerance: Patient limited by fatigue;Patient limited by pain Patient left: in bed;with call bell/phone within reach;with family/visitor present Nurse Communication: Mobility status    Demita Tobia B. Njeri Vicente, PT, DPT [phone]   05/03/2013, 3:45 PM

## 2013-05-04 ENCOUNTER — Other Ambulatory Visit: Payer: 59

## 2013-05-04 MED ORDER — OXYCODONE-ACETAMINOPHEN 5-325 MG PO TABS: 1.0000 | ORAL_TABLET | ORAL | Status: DC | PRN

## 2013-05-04 MED ORDER — DIAZEPAM 5 MG PO TABS: 5.0000 mg | ORAL_TABLET | Freq: Four times a day (QID) | ORAL | Status: DC | PRN

## 2013-05-04 MED FILL — Heparin Sodium (Porcine) Inj 1000 Unit/ML: INTRAMUSCULAR | Qty: 30 | Status: AC

## 2013-05-04 MED FILL — Sodium Chloride IV Soln 0.9%: INTRAVENOUS | Qty: 2000 | Status: AC

## 2013-05-04 NOTE — Progress Notes (Signed)
Pt and husband given D/C instructions with Rx's, verbal understanding was given. Pt received equipment from [facility] prior to D/C. Pt D/C'd home via wheelchair @ 1220 per MD order. Rema Fendt, RN

## 2013-05-04 NOTE — Progress Notes (Signed)
Physical Therapy Treatment Patient Details Name: [name] MRN: [id number] DOB: 1956/05/10 Today's Date: 05/04/2013 Time: 1000-1015 PT Time Calculation (min): 15 min  PT Assessment / Plan / Recommendation  History of Present Illness L4-5 decompressive laminectomy with bilateral L4 and L5 decompressive foraminotomies to relieve symptomatic nerve root compression at L4 and L5; more than would be required for simple interbody fusion alone.   PT Comments   Pt making great progress with mobility today with no complaints of nausea or lightheadedness. Stair education completed. Re-educated pt on back precautions.   Follow Up Recommendations  No PT follow up;Supervision/Assistance - 24 hour     Equipment Recommendations  Rolling walker with 5" wheels;3in1 (PT)       Frequency Min 5X/week   Progress towards PT Goals Progress towards PT goals: Progressing toward goals  Plan Current plan remains appropriate    Precautions / Restrictions Precautions Precautions: Back Precaution Comments: reviewed back precautions, brace use, lifting restrictions with pt and her husband Required Braces or Orthoses: Spinal Brace Spinal Brace: Applied in sitting position Restrictions Weight Bearing Restrictions: No Other Position/Activity Restrictions: pt and spouse able to don brace without cues/assistance       Mobility  Bed Mobility Rolling Left: 5: Supervision Left Sidelying to Sit: 5: Supervision;HOB flat Sitting - Scoot to Edge of Bed: 5: Supervision Sit to Supine: Not Tested (comment) Sit to Sidelying Right: Not Tested (comment) Details for Bed Mobility Assistance: supervision for safety, cues for log roll technique Transfers Sit to Stand: 5: Supervision;From bed;With upper extremity assist Stand to Sit: 5: Supervision;To bed;With upper extremity assist Details for Transfer Assistance: supervision for safety only Ambulation/Gait Ambulation/Gait Assistance: 5: Supervision Ambulation  Distance (Feet): 150 Feet Assistive device: Rolling walker Gait Pattern: Step-through pattern;Decreased stride length Gait velocity: decreased General Gait Details: guarded due to pain, no reports or signs of syncope Stairs: Yes Stairs Assistance: 4: Min guard Stair Management Technique: Two rails;Forwards;Step to pattern Number of Stairs: 5      PT Goals (current goals can now be found in the care plan section) Acute Rehab PT Goals Patient Stated Goal: to get back home, return to work when she can PT Goal Formulation: With patient/family Time For Goal Achievement: 05/10/13 Potential to Achieve Goals: Good  Visit Information  Last PT Received On: 05/04/13 Assistance Needed: +1 History of Present Illness: L4-5 decompressive laminectomy with bilateral L4 and L5 decompressive foraminotomies to relieve symptomatic nerve root compression at L4 and L5; more than would be required for simple interbody fusion alone.    Subjective Data  Patient Stated Goal: to get back home, return to work when she can   Cognition  Cognition Arousal/Alertness: Awake/alert Behavior During Therapy: WFL for tasks assessed/performed Overall Cognitive Status: Within Functional Limits for tasks assessed    End of Session PT - End of Session Equipment Utilized During Treatment: Gait belt;Back brace Activity Tolerance: Patient tolerated treatment well Patient left: in bed;with family/visitor present;with call bell/phone within reach Nurse Communication: Mobility status;Other (comment) (needed DME)   GP  Sallyanne Kuster 05/04/2013, 10:28 AM  Sallyanne Kuster, PTA Office- [phone]

## 2013-05-04 NOTE — Discharge Summary (Signed)
Physician Discharge Summary  Patient ID: [name] MRN: [id number] DOB/AGE: 09/16/1955 57 y.o.  Admit date: 05/02/2013 Discharge date: 05/04/2013  Admission Diagnoses:  Discharge Diagnoses:  Principal Problem:   Spinal stenosis, lumbar region, with neurogenic claudication   Discharged Condition: good  Hospital Course: Patient admitted to the hospital where she had an uncomplicated L4-5 decompression and fusion. Postoperatively she is doing well. She had some transient hypotension initially after surgery which is normalized. She is having no lower extremity pain. She is having no numbness or weakness. She is up ambulating and overall feels ready for discharge home.  Consults:   Significant Diagnostic Studies:   Treatments:   Discharge Exam: Blood pressure 86/55, pulse 86, temperature 99.1 F (37.3 C), temperature source Oral, resp. rate 16, height 5' 2.99" (1.6 m), weight 53.5 kg (117 lb 15.1 oz), SpO2 95.00%. Awake and alert. Oriented and appropriate. Cranial nerve function is intact. Motor and sensory function of the extremities normal. Wound clean and dry. Chest and abdomen benign.  Disposition: 01-Home or Self Care   Future Appointments Provider Department Dept Phone   06/01/2013 2:00 PM Alvan Dame, DPM [facility] [phone]       Medication List         albuterol 108 (90 BASE) MCG/ACT inhaler  Commonly known as:  PROVENTIL HFA;VENTOLIN HFA  Inhale 2 puffs into the lungs every 6 (six) hours as needed for wheezing.     calcium-vitamin D 500-200 MG-UNIT per tablet  Commonly known as:  OSCAL WITH D  Take 2 tablets by mouth every evening.     cholecalciferol 1000 UNITS tablet  Commonly known as:  VITAMIN D  Take 1,000 Units by mouth every evening.     clonazePAM 1 MG tablet  Commonly known as:  KLONOPIN  Take 0.5 mg by mouth as needed for anxiety. For sleep/ anxiety     clonazePAM 1 MG tablet  Commonly known as:  KLONOPIN  Take  1 mg by mouth at bedtime.     diazepam 5 MG tablet  Commonly known as:  VALIUM  Take 1-2 tablets (5-10 mg total) by mouth every 6 (six) hours as needed (Spasms).     diclofenac sodium 1 % Gel  Commonly known as:  VOLTAREN  Apply 2 g topically 4 (four) times daily as needed (to hip and back for arthritic pain). For arthritic pain     estradiol 1 MG tablet  Commonly known as:  ESTRACE  Take 1 mg by mouth every other day.     fluticasone 50 MCG/ACT nasal spray  Commonly known as:  FLONASE  Place 2 sprays into the nose daily.     ibuprofen 200 MG tablet  Commonly known as:  ADVIL,MOTRIN  Take 400 mg by mouth every 6 (six) hours as needed. For pain     multivitamin with minerals Tabs tablet  Take 1 tablet by mouth every evening.     omeprazole 40 MG capsule  Commonly known as:  PRILOSEC  Take 40 mg by mouth every morning.     ondansetron 4 MG disintegrating tablet  Commonly known as:  ZOFRAN-ODT  Take 4 mg by mouth every 8 (eight) hours as needed for nausea.     oxyCODONE-acetaminophen 5-325 MG per tablet  Commonly known as:  PERCOCET/ROXICET  Take 1-2 tablets by mouth every 4 (four) hours as needed.     sodium chloride 0.9 % SOLN 250 mL with inFLIXimab 100 MG SOLR 5 mg/kg  Inject 5 mg/kg into the vein every 28 (twenty-eight) days. Sherrian Divers rheumatologist  medical [facility] is remicade is received     sucralfate 1 GM/10ML suspension  Commonly known as:  CARAFATE  Take 10 mLs (1 g total) by mouth 3 (three) times daily as needed. For digestion     traMADol 50 MG tablet  Commonly known as:  ULTRAM  Take 50 mg by mouth every 6 (six) hours as needed for pain.     venlafaxine 75 MG tablet  Commonly known as:  EFFEXOR  Take 225 mg by mouth daily.     zolmitriptan 5 MG tablet  Commonly known as:  ZOMIG  Take 5 mg by mouth as needed for migraine.     zolpidem 10 MG tablet  Commonly known as:  AMBIEN  Take 1 tablet (10 mg total) by mouth at bedtime  as needed for sleep.           Follow-up Information   Follow up with Temple Pacini, MD.   Specialty:  Neurosurgery   Contact information:   [address] [place] [address] [phone]       Signed: Cesario Weidinger A 05/04/2013, 7:58 AM

## 2013-05-05 NOTE — Progress Notes (Signed)
OT TREATMENT NOTE  Completed all education for ADL/mobility for ADL regarding back precautions. No further OT needs. Ready for D/C.   05/04/13 1020  OT Visit Information  Last OT Received On 05/04/13  Assistance Needed +1  History of Present Illness L4-5 decompressive laminectomy with bilateral L4 and L5 decompressive foraminotomies to relieve symptomatic nerve root compression at L4 and L5; more than would be required for simple interbody fusion alone.  OT Time Calculation  OT Start Time 1020  OT Stop Time 1043  OT Time Calculation (min) 23 min  Precautions  Precautions Back  Precaution Comments reviewed back precautions, brace use, lifting restrictions with pt and her husband  Required Braces or Orthoses Spinal Brace  Spinal Brace Applied in sitting position  ADL  ADL Comments Reviewed AE/DME needs. Pt able to return demonstrate ability to complete self care. increased safety and independence with AE. Husbna dpresent for education  Cognition  Arousal/Alertness Awake/alert  Behavior During Therapy WFL for tasks assessed/performed  Overall Cognitive Status Within Functional Limits for tasks assessed  Bed Mobility  Rolling Left 6: Modified independent (Device/Increase time)  Left Sidelying to Sit HOB flat;6: Modified independent (Device/Increase time)  Sitting - Scoot to Edge of Bed 6: Modified independent (Device/Increase time)  Transfers  Sit to Stand From bed;With upper extremity assist;6: Modified independent (Device/Increase time)  Stand to Sit To bed;With upper extremity assist;6: Modified independent (Device/Increase time)  Details for Transfer Assistance good carry over from earlier session  OT - End of Session  Equipment Utilized During Treatment Back brace  Activity Tolerance Patient tolerated treatment well  Patient left in bed;with call bell/phone within reach;with family/visitor present  Nurse Communication Mobility status  OT Assessment/Plan  OT Plan Discharge plan  remains appropriate  OT Frequency Min 2X/week  Follow Up Recommendations No OT follow up  OT Equipment 3 in 1 bedside comode  OT Goal Progression  Progress towards OT goals Progressing toward goals  Acute Rehab OT Goals  OT Goal Formulation With patient  Time For Goal Achievement 05/09/13  Potential to Achieve Goals Good  Patient Stated Goal to get back home, return to work when she can  ADL Goals  Pt Will Perform Lower Body Dressing with set-up;with supervision;with adaptive equipment;sit to/from stand  Pt Will Transfer to Toilet with supervision;ambulating;regular height toilet  Pt Will Perform Toileting - Clothing Manipulation and hygiene with supervision;sit to/from stand  Pt Will Perform Tub/Shower Transfer with supervision;Shower transfer;ambulating;shower seat  Additional ADL Goal #1 Pt will be able to don/doff brace independently  Additional ADL Goal #2 Pt will be able to state 3/3 back precautions  Additional ADL Goal #3 Pt will be Mod I in and OOB for [facility], OTR/L  [phone] 05/04/2013

## 2013-06-01 ENCOUNTER — Ambulatory Visit: Payer: 59

## 2013-07-12 ENCOUNTER — Other Ambulatory Visit: Payer: Self-pay | Admitting: Internal Medicine

## 2013-08-23 ENCOUNTER — Encounter: Payer: Self-pay | Admitting: *Deleted

## 2013-08-24 ENCOUNTER — Telehealth: Payer: Self-pay | Admitting: Internal Medicine

## 2013-08-24 ENCOUNTER — Ambulatory Visit (INDEPENDENT_AMBULATORY_CARE_PROVIDER_SITE_OTHER): Payer: 59 | Admitting: Internal Medicine

## 2013-08-24 ENCOUNTER — Encounter: Payer: Self-pay | Admitting: Internal Medicine

## 2013-08-24 VITALS — BP 120/70 | HR 86 | Temp 98.4°F | Resp 18 | Ht 63.0 in | Wt 119.0 lb

## 2013-08-24 DIAGNOSIS — R208 Other disturbances of skin sensation: Secondary | ICD-10-CM

## 2013-08-24 DIAGNOSIS — I10 Essential (primary) hypertension: Secondary | ICD-10-CM

## 2013-08-24 DIAGNOSIS — R209 Unspecified disturbances of skin sensation: Secondary | ICD-10-CM

## 2013-08-24 DIAGNOSIS — G5600 Carpal tunnel syndrome, unspecified upper limb: Secondary | ICD-10-CM | POA: Insufficient documentation

## 2013-08-24 DIAGNOSIS — M48062 Spinal stenosis, lumbar region with neurogenic claudication: Secondary | ICD-10-CM

## 2013-08-24 NOTE — Progress Notes (Signed)
Subjective:    Patient ID: [name], female    DOB: 11-25-1955, 58 y.o.   MRN: [id number]  HPI  58 year old patient who has a history of lumbar spinal stenosis with neurogenic claudication. 2 status post lumbar laminectomy and fusion in October of last year with total resolution of her lower extremity symptoms.  For the past 6 weeks she is noted burning pain and also a sense of swelling involving the hands and arms distal to the elbows.  She also has noted some numbness. 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 Denies any neck pain or motor weakness. She states her symptoms are worse at night and that she gets some relief by extending and abducting her arms. Symptoms do not seem to be aggravated by any movement of the head or neck.  No constitutional complaints  Past Medical History  Diagnosis Date  . IBS (irritable bowel syndrome)     constipation predominant  . Interstitial cystitis   . Lupus   . Hemorrhoids   . Allergy   . Blood transfusion without reported diagnosis     x3  . Heart murmur   . Osteoporosis     osteopenia  . Anxiety     takes Klonopin nighlty  . GERD (gastroesophageal reflux disease)     takes Omeprazole daily  . Nausea     takes Zofran prn  . Insomnia     takes Ambien prn  . Depression     takes Effexor daily  . PONV (postoperative nausea and vomiting)     laryngospasm-1999  . History of bronchitis     winter of 2014  . Pneumonia     hx of;last time 85yrs ago  . History of migraine     last one a yr ago and takes Zomig prn  . Seasonal allergies     Flonase daily   . Numbness     both legs and related to back  . Arthritis     uses Diclofenac gel;Rheumatoid  . Chronic back pain   . Joint pain   . Joint swelling   . Eczema   . Chronic constipation     Miralax once weekly  . History of kidney stones     passed on her own  . History of bladder infections   . Anemia     after birth of child 63yrs ago    History   Social History  . Marital Status: Married    Spouse Name: N/A    Number of Children: N/A  . Years of Education: N/A   Occupational History  . RN [employer]   Social History Main Topics  . Smoking status: Never Smoker   . Smokeless tobacco: Never Used  . Alcohol Use: 0.5 oz/week    1 drink(s) per week     Comment: socially  . Drug Use: No  . Sexual Activity: Yes   Other Topics Concern  . Not on file   Social History Narrative  . No narrative on file    Past Surgical History  Procedure Laterality Date  . Abdominal hysterectomy  2007  . Tonsillectomy    . Excision morton's neuroma Right   . Colonoscopy  12/24/04    internal and external hemorrhoids  . Flexible sigmoidoscopy  03/07/08    internal and external hemorrhoids  . Upper gastrointestinal endoscopy  10/23/10    gastritis, irregular  Z-line  . Wisdom teeth extracted    . Uterine ablation    . Colonoscopy      Family History  Problem Relation Age of Onset  . Coronary artery disease Father   . Diabetes      uncle  . Diverticulosis Mother   . Hypertension Mother   . Osteoarthritis Mother   . Colon cancer Neg Hx     Allergies  Allergen Reactions  . Aspartame And Phenylalanine Other (See Comments)    Migraines  . Beef-Derived Products Other (See Comments)    severe cramping  . Citrus Other (See Comments)    Causes Interstitial cystitis flares  . Penicillins Hives    Denies airway involvement  . Promethazine Hcl Other (See Comments)    Muscle twitching and contracture   . Adhesive [Tape]     Red splotches  . Doxycycline Nausea And Vomiting  . Morphine Sulfate Nausea And Vomiting    Current Outpatient Prescriptions on File Prior to Visit  Medication Sig Dispense Refill  . albuterol (PROVENTIL HFA;VENTOLIN HFA) 108 (90 BASE) MCG/ACT inhaler Inhale 2 puffs into the lungs every 6 (six) hours as needed for wheezing.  1 Inhaler  0  . calcium-vitamin D  (OSCAL WITH D) 500-200 MG-UNIT per tablet Take 2 tablets by mouth every evening.      . cholecalciferol (VITAMIN D) 1000 UNITS tablet Take 1,000 Units by mouth every evening.      . clonazePAM (KLONOPIN) 1 MG tablet Take 1 mg by mouth at bedtime.      . diazepam (VALIUM) 5 MG tablet Take 1-2 tablets (5-10 mg total) by mouth every 6 (six) hours as needed (Spasms).  60 tablet  0  . estradiol (ESTRACE) 1 MG tablet Take 1 mg by mouth every other day.      . fluticasone (FLONASE) 50 MCG/ACT nasal spray Place 2 sprays into the nose daily.  16 g  5  . ibuprofen (ADVIL,MOTRIN) 200 MG tablet Take 400 mg by mouth every 6 (six) hours as needed. For pain      . Multiple Vitamin (MULTIVITAMIN WITH MINERALS) TABS Take 1 tablet by mouth every evening.      Marland Kitchen omeprazole (PRILOSEC) 40 MG capsule Take 40 mg by mouth every morning.      . ondansetron (ZOFRAN-ODT) 4 MG disintegrating tablet Take 4 mg by mouth every 8 (eight) hours as needed for nausea.      . sodium chloride 0.9 % SOLN 250 mL with inFLIXimab 100 MG SOLR 5 mg/kg Inject 5 mg/kg into the vein every 28 (twenty-eight) days. Thyra Breed rheumatologist  medical [facility]  is remicade is received      . sucralfate (CARAFATE) 1 GM/10ML suspension Take 10 mLs (1 g total) by mouth 3 (three) times daily as needed. For digestion  840 mL  5  . venlafaxine (EFFEXOR) 75 MG tablet Take 225 mg by mouth daily.      Marland Kitchen zolmitriptan (ZOMIG) 5 MG tablet Take 5 mg by mouth as needed for migraine.      Marland Kitchen zolpidem (AMBIEN) 10 MG tablet Take 1 tablet (10 mg total) by mouth at bedtime as needed for sleep.  30 tablet  0   No current facility-administered medications on file prior to visit.    BP 120/70  Pulse 86  Temp(Src) 98.4 F (36.9 C) (Oral)  Resp 18  Ht 5\' 3"  (1.6 m)  Wt 119 lb (53.978 kg)  BMI 21.09 kg/m2  SpO2  98%       Review of Systems  Constitutional: Negative.   HENT: Negative for congestion, dental problem, hearing loss,  rhinorrhea, sinus pressure, sore throat and tinnitus.   Eyes: Negative for pain, discharge and visual disturbance.  Respiratory: Negative for cough and shortness of breath.   Cardiovascular: Negative for chest pain, palpitations and leg swelling.  Gastrointestinal: Negative for nausea, vomiting, abdominal pain, diarrhea, constipation, blood in stool and abdominal distention.  Genitourinary: Negative for dysuria, urgency, frequency, hematuria, flank pain, vaginal bleeding, vaginal discharge, difficulty urinating, vaginal pain and pelvic pain.  Musculoskeletal: Negative for arthralgias, gait problem and joint swelling.  Skin: Negative for rash.  Neurological: Positive for numbness. Negative for dizziness, syncope, speech difficulty, weakness and headaches.  Hematological: Negative for adenopathy.  Psychiatric/Behavioral: Negative for behavioral problems, dysphoric mood and agitation. The patient is not nervous/anxious.        Objective:   Physical Exam  Constitutional: She is oriented to person, place, and time. She appears well-developed and well-nourished. No distress.  Musculoskeletal:  Positive Tinel's on the left  Neurological: She is alert and oriented to person, place, and time. She has normal reflexes. No cranial nerve deficit.  Normal grip strength Biceps and triceps strength normal Biceps and triceps reflexes brisk and equal  Mild hypesthesia the hands          Assessment & Plan:   Painful dysesthesias of the arms.  We'll proceed with a NCS.  The patient does have a follow up  with her neurosurgeon later today.  She has had a lumbar fusion with titanium hardware. She will discuss with neurosurgery whether a potential future cervical MRI is possible.  Certainly concerned about cervical spinal stenosis-patient states symptoms are quite similar caused by the lumbar spinal stenosis involving her legs

## 2013-08-24 NOTE — Telephone Encounter (Signed)
Should go to Dr. Raliegh Ip

## 2013-08-24 NOTE — Telephone Encounter (Signed)
Pt states she was to have referral for conduction study, but states Dr Trenton Gammon will do that for her, so no need.

## 2013-08-24 NOTE — Patient Instructions (Signed)
Neurosurgical followup  Nerve conduction studies as discussed  Call or return to clinic prn if these symptoms worsen or fail to improve as anticipated.

## 2013-08-24 NOTE — Progress Notes (Signed)
Pre-visit discussion using our clinic review tool. No additional management support is needed unless otherwise documented below in the visit note.  

## 2013-08-25 ENCOUNTER — Telehealth: Payer: Self-pay | Admitting: Internal Medicine

## 2013-08-25 NOTE — Telephone Encounter (Signed)
Sent to dr Raliegh Ip

## 2013-08-25 NOTE — Telephone Encounter (Signed)
Relevant patient education assigned to patient using Emmi. ° °

## 2013-10-20 ENCOUNTER — Other Ambulatory Visit: Payer: Self-pay

## 2013-10-20 DIAGNOSIS — Z1231 Encounter for screening mammogram for malignant neoplasm of breast: Secondary | ICD-10-CM

## 2013-10-27 ENCOUNTER — Ambulatory Visit (INDEPENDENT_AMBULATORY_CARE_PROVIDER_SITE_OTHER): Payer: 59 | Admitting: Gastroenterology

## 2013-10-27 ENCOUNTER — Encounter: Payer: Self-pay | Admitting: *Deleted

## 2013-10-27 VITALS — BP 106/70 | HR 68 | Ht 63.0 in | Wt 114.0 lb

## 2013-10-27 DIAGNOSIS — R1013 Epigastric pain: Secondary | ICD-10-CM

## 2013-10-27 DIAGNOSIS — R110 Nausea: Secondary | ICD-10-CM

## 2013-10-27 NOTE — Progress Notes (Signed)
10/27/2013 [name] [id number] [birth year]   History of Present Illness:  This is a 58 year old female who is known well to [name]. She presents to our office today with complaints of epigastric abdominal pain and nausea that began on Sunday. She describes the pain as a gnawing sensation and says that it seems to go to her back as well. She is on omeprazole 40 mg daily and started taking her Carafate, Zofran, and Tums as well as she has done for similar complaints in the past. She says that these symptoms feel different than her previous complaints of abdominal pain and reflux. Also, her symptoms did not respond to the above medications as they usually do. The pain does get worse with eating and she's not been able to drink her coffee.  Her last EGD was in April 2012 by Dr. Olevia Perches at which time she was found to have some gastritis and an irregular Z line.  She had abdominal ultrasound in 2012 that was also normal.    While she was here she also mentioned that her stools have been thinner in caliber recently as well. Stools are soft. She did have some mild left-sided abdominal discomfort as well, but that seemed to subside after having a bowel movement and after taking some antispasmodic.    Current Medications, Allergies, Past Medical History, Past Surgical History, Family History and Social History were reviewed in [employer] record.   Physical Exam: BP 106/70  Pulse 68  Ht 5\' 3"  (1.6 m)  Wt 114 lb (51.71 kg)  BMI 20.20 kg/m2 General: Well developed white female in no acute distress Head: Normocephalic and atraumatic Eyes:  Sclerae anicteric, conjunctiva pink  Ears: Normal auditory acuity Lungs: Clear throughout to auscultation Heart: Regular rate and rhythm Abdomen: Soft, non-distended.  Benign abdominal exam with very mild tenderness in the epigastrium.  Normal bowel sounds. Musculoskeletal: Symmetrical with no gross deformities  Extremities: No edema   Neurological: Alert oriented x 4, grossly non-focal Psychological:  Alert and cooperative. Normal mood and affect  Assessment and Recommendations: -Epigastric abdominal pain and nausea x 4 days:  Likely GERD related or functional dyspepsia.  Will discontinue her omeprazole for now and try samples of Dexilant 60 mg daily.  She will also try some OTC gaviscon liquid as well. -Thin stools with left sided abdominal discomfort:  Likely IBS related/intestinal spasm.  Continue anti-spasmodic prn.

## 2013-10-27 NOTE — Patient Instructions (Signed)
Stop Omeprazole and start Dexilant Samples given today  Please purchase Gaviscon over the counter and take as directed

## 2013-11-01 NOTE — Progress Notes (Signed)
Pt seen, agree

## 2013-11-09 ENCOUNTER — Ambulatory Visit: Payer: 59

## 2013-11-10 ENCOUNTER — Ambulatory Visit
Admission: RE | Admit: 2013-11-10 | Discharge: 2013-11-10 | Disposition: A | Discharge status: Home / Self Care | Payer: 59 | Source: Ambulatory Visit

## 2013-11-10 DIAGNOSIS — Z1231 Encounter for screening mammogram for malignant neoplasm of breast: Secondary | ICD-10-CM

## 2013-11-16 ENCOUNTER — Ambulatory Visit (INDEPENDENT_AMBULATORY_CARE_PROVIDER_SITE_OTHER): Payer: 59 | Admitting: Physician Assistant

## 2013-11-16 ENCOUNTER — Encounter: Payer: Self-pay | Admitting: Physician Assistant

## 2013-11-16 VITALS — BP 120/72 | HR 81 | Temp 98.6°F | Resp 16 | Wt 117.0 lb

## 2013-11-16 DIAGNOSIS — R110 Nausea: Secondary | ICD-10-CM

## 2013-11-16 DIAGNOSIS — K219 Gastro-esophageal reflux disease without esophagitis: Secondary | ICD-10-CM

## 2013-11-16 DIAGNOSIS — R42 Dizziness and giddiness: Secondary | ICD-10-CM

## 2013-11-16 DIAGNOSIS — R0789 Other chest pain: Secondary | ICD-10-CM

## 2013-11-16 DIAGNOSIS — R61 Generalized hyperhidrosis: Secondary | ICD-10-CM

## 2013-11-16 LAB — CBC WITH DIFFERENTIAL/PLATELET
Basophils Absolute: 0 10*3/uL (ref 0.0–0.1)
Basophils Relative: 0.5 % (ref 0.0–3.0)
Eosinophils Absolute: 0.2 10*3/uL (ref 0.0–0.7)
Eosinophils Relative: 3.2 % (ref 0.0–5.0)
HCT: 40.4 % (ref 36.0–46.0)
Hemoglobin: 13.3 g/dL (ref 12.0–15.0)
Lymphocytes Relative: 30.2 % (ref 12.0–46.0)
Lymphs Abs: 1.5 10*3/uL (ref 0.7–4.0)
MCHC: 32.9 g/dL (ref 30.0–36.0)
MCV: 90.4 fl (ref 78.0–100.0)
Monocytes Absolute: 0.4 10*3/uL (ref 0.1–1.0)
Monocytes Relative: 7.9 % (ref 3.0–12.0)
Neutro Abs: 3 10*3/uL (ref 1.4–7.7)
Neutrophils Relative %: 58.2 % (ref 43.0–77.0)
Platelets: 170 10*3/uL (ref 150.0–400.0)
RBC: 4.47 Mil/uL (ref 3.87–5.11)
RDW: 16 % — ABNORMAL HIGH (ref 11.5–15.5)
WBC: 5.1 10*3/uL (ref 4.0–10.5)

## 2013-11-16 LAB — BASIC METABOLIC PANEL
BUN: 13 mg/dL (ref 6–23)
CO2: 28 mEq/L (ref 19–32)
Calcium: 9.3 mg/dL (ref 8.4–10.5)
Chloride: 104 mEq/L (ref 96–112)
Creatinine, Ser: 0.9 mg/dL (ref 0.4–1.2)
GFR: 70.33 mL/min (ref >60.00)
Glucose, Bld: 60 mg/dL — ABNORMAL LOW (ref 70–99)
Potassium: 3.9 mEq/L (ref 3.5–5.1)
Sodium: 139 mEq/L (ref 135–145)

## 2013-11-16 LAB — TSH: TSH: 1.11 u[IU]/mL (ref 0.35–4.50)

## 2013-11-16 LAB — VITAMIN B12: Vitamin B-12: 313 pg/mL (ref 211–911)

## 2013-11-16 LAB — HEMOGLOBIN A1C: Hgb A1c MFr Bld: 5.9 % (ref 4.6–6.5)

## 2013-11-16 LAB — T4, FREE: Free T4: 0.67 ng/dL (ref 0.60–1.60)

## 2013-11-16 NOTE — Progress Notes (Signed)
Pre visit review using our clinic review tool, if applicable. No additional management support is needed unless otherwise documented below in the visit note. 

## 2013-11-16 NOTE — Patient Instructions (Signed)
You have lab work done today, we will call you with these results when they are available.  Keep a journal of your symptoms over the next few weeks.  Follow up in about 2 weeks to discuss lab work and further work up if necessary.  Continue PPI for now, as well as any other plan from GI. Follow up with GI if these symptoms are worsening or not improving despite treatment.  Follow up sooner if symptoms worsen despite treatment.   Diet for Gastroesophageal Reflux Disease, Adult Reflux is when stomach acid flows up into the esophagus. The esophagus becomes irritated and sore (inflammation). When reflux happens often and is severe, it is called gastroesophageal reflux disease (GERD). What you eat can help ease any discomfort caused by GERD. FOODS OR DRINKS TO AVOID OR LIMIT  Coffee and black tea, with or without caffeine.  Bubbly (carbonated) drinks with caffeine or energy drinks.  Strong spices, such as pepper, cayenne pepper, curry, or chili powder.  Peppermint or spearmint.  Chocolate.  High-fat foods, such as meats, fried food, oils, butter, or nuts.  Fruits and vegetables that cause discomfort. This includes citrus fruits and tomatoes.  Alcohol. If a certain food or drink irritates your GERD, avoid eating or drinking it. THINGS THAT MAY HELP GERD INCLUDE:  Eat meals slowly.  Eat 5 to 6 small meals a day, not 3 large meals.  Do not eat food for a certain amount of time if it causes discomfort.  Wait 3 hours after eating before lying down.  Keep the head of your bed raised 6 to 9 inches (15 23 centimeters). Put a foam wedge or blocks under the legs of the bed.  Stay active. Weight loss, if needed, may help ease your discomfort.  Wear loose-fitting clothing.  Do not smoke or chew tobacco. Document Released: 12/30/2011 Document Reviewed: 12/30/2011 [facility] Patient Information 2014 [place].

## 2013-11-16 NOTE — Progress Notes (Signed)
Subjective:    Patient ID: [name], female    DOB: 11-11-1955, 58 y.o.   MRN: [id number]  HPI Patient is a 58 year old Caucasian female with a history of GERD presenting to the clinic today for night sweats, nausea, and fatigue. Patient states that she has noticed a loss of 3 or 4 pounds over the past 3 months possibly due to a lack of appetite.  Patient was seen by GI on 10/27/2013 for a gnawing epigastric abdominal pain worse with eating, and was given a PPI to take to treat what they thought was GERD. She states that the symptoms have not improved. This pain does not radiate. Patient has a positive family history of pancreatic cancer and is concerned that this may be related. She plans to followup with GI regarding the treatment not working.  She also complains of a one-month history of night sweats occurring several nights during the week where she soaks her bed in sweat. Patient is currently taking hormones and has been prescribed them since a hysterectomy for fibroids in 2007. She states that she had hot flashes when she originally began treatment with hormones, however never night sweats, and the prescription hormones do not seem to help her night sweats.  Pt also complains of feeling lumps or nodules under the skin in her left axilla over the last few months. She states that the nodules never come to a head, or produce pus, or cause a rash, and they resolve on their own. They are resolved currently, and she has no family history of breast cancer.  She also complains of lightheadedness and nausea during the day which occurs for no known reason, and is not associated with any daily activities. Patient is fatigued daily, has occasional shortness of breath, and had an episode of chest pressure on Friday which caused her to seek medical advice today. This episode was isolated, happened while sitting, was not due to exertion, patient was not stressed, lasted for 2 minutes and did not radiate,  there was no pain, and it went away on its own. Patient has a positive family history of heart disease, and was concerned that this was a sign of developing heart disease. She denies fevers, chills, vomiting, diarrhea, chest pain, headache, anxiety, hematochezia, and melena.   Review of Systems As per the history of present illness and are otherwise negative.  Past Medical History  Diagnosis Date  . IBS (irritable bowel syndrome)     constipation predominant  . Interstitial cystitis   . Lupus   . Hemorrhoids   . Allergy   . Blood transfusion without reported diagnosis     x3  . Heart murmur   . Osteoporosis     osteopenia  . Anxiety     takes Klonopin nighlty  . GERD (gastroesophageal reflux disease)     takes Omeprazole daily  . Nausea     takes Zofran prn  . Insomnia     takes Ambien prn  . Depression     takes Effexor daily  . PONV (postoperative nausea and vomiting)     laryngospasm-1999  . History of bronchitis     winter of 2014  . Pneumonia     hx of;last time 21yrs ago  . History of migraine     last one a yr ago and takes Zomig prn  . Seasonal allergies     Flonase daily   . Numbness     both legs and related to back  .  Arthritis     uses Diclofenac gel;Rheumatoid  . Chronic back pain   . Joint pain   . Joint swelling   . Eczema   . Chronic constipation     Miralax once weekly  . History of kidney stones     passed on her own  . History of bladder infections   . Anemia     after birth of child 51yrs ago   Past Surgical History  Procedure Laterality Date  . Abdominal hysterectomy  2007  . Tonsillectomy    . Excision morton's neuroma Right   . Colonoscopy  02/11/2013 and 12/24/04    internal and external hemorrhoids  . Flexible sigmoidoscopy  03/07/2008    internal and external hemorrhoids  . Upper gastrointestinal endoscopy  10/23/2010    gastritis, irregular Z-line  . Wisdom teeth extracted    . Uterine ablation      reports that she has never  smoked. She has never used smokeless tobacco. She reports that she drinks about .5 ounces of alcohol per week. She reports that she does not use illicit drugs. family history includes Coronary artery disease in her father; Diabetes in an other family member; Diverticulosis in her mother; Hypertension in her mother; Osteoarthritis in her mother. There is no history of Colon cancer. Allergies  Allergen Reactions  . Aspartame And Phenylalanine Other (See Comments)    Migraines  . Beef-Derived Products Other (See Comments)    severe cramping  . Citrus Other (See Comments)    Causes Interstitial cystitis flares  . Penicillins Hives    Denies airway involvement  . Promethazine Hcl Other (See Comments)    Muscle twitching and contracture   . Adhesive [Tape]     Red splotches  . Doxycycline Nausea And Vomiting  . Morphine Sulfate Nausea And Vomiting    No change in PFS history since last office visit on 10/27/2013 Except for had mammogram last week 11/10/2013. Awaiting Results.     Objective:   Physical Exam  Nursing note and vitals reviewed. Constitutional: She is oriented to person, place, and time. She appears well-developed and well-nourished. No distress.  HENT:  Head: Normocephalic and atraumatic.  Eyes: Conjunctivae and EOM are normal. Pupils are equal, round, and reactive to light.  No exophthalmos  Neck: Normal range of motion. Neck supple. No JVD present. No tracheal deviation present. Thyromegaly (Very mild Thyromegally. Left side slightly larger than Right side. No nodules palpated.) present.  Cardiovascular: Normal rate, regular rhythm, normal heart sounds and intact distal pulses.  Exam reveals no gallop and no friction rub.   No murmur heard. Pulmonary/Chest: Effort normal and breath sounds normal. No stridor. No respiratory distress. She has no wheezes. She has no rales. She exhibits no tenderness.  Bilateral breast exam normal, no masses. No lymphadenopathy present in  bilateral axilla.  Abdominal: Soft. Bowel sounds are normal. She exhibits no distension and no mass. There is no tenderness. There is no rebound and no guarding.  Musculoskeletal: Normal range of motion. She exhibits no edema and no tenderness.  Lymphadenopathy:    She has no cervical adenopathy.  Neurological: She is alert and oriented to person, place, and time. She exhibits normal muscle tone.  Hyperreflexic tendon reflexes throughout.  Skin: Skin is warm and dry. No rash noted. She is not diaphoretic. No erythema. No pallor.  Psychiatric: She has a normal mood and affect. Her behavior is normal. Judgment and thought content normal.   Filed Vitals:   11/16/13  1103  BP: 120/72  Pulse: 81  Temp: 98.6 F (37 C)  TempSrc: Oral  Resp: 16  Weight: 117 lb (53.071 kg)  SpO2: 98%   Lab Results  Component Value Date   WBC 5.7 04/20/2013   HGB 13.8 04/20/2013   HCT 41.2 04/20/2013   PLT 196 04/20/2013   GLUCOSE 75 04/20/2013   CHOL 185 04/14/2013   TRIG 56.0 04/14/2013   HDL 79.80 04/14/2013   LDLCALC 94 04/14/2013   ALT 22 06/13/2012   AST 23 06/13/2012   NA 141 04/20/2013   K 4.0 04/20/2013   CL 106 04/20/2013   CREATININE 0.79 04/20/2013   BUN 13 04/20/2013   CO2 28 04/20/2013   TSH 1.62 02/10/2013   INR 1.0 01/20/2007   EKG: Sinus Rhythm, Within normal limits.     Assessment & Plan:  [name] was seen today for night sweats.  Diagnoses and associated orders for this visit:  Chest pressure - EKG 12-Lead- Normal, Sinus Rhythm, Within normal limits.  Night sweat - T4, Free - CBC with Differential - Basic metabolic panel - TSH - Hemoglobin A1c - Vitamin B12  Nausea alone - T4, Free - CBC with Differential - Basic metabolic panel - TSH - Hemoglobin A1c - Vitamin B12  Lightheadedness - T4, Free - CBC with Differential - Basic metabolic panel - TSH - Hemoglobin A1c - Vitamin B12  GERD - Possible worsening symptoms. Managed by GI. - PT plans to follow up with GI for  symptoms that are not improved with treatment.  Will have Pt follow up in about 2 weeks to go over lab results and determine best future course of action. In the meantime, pt will keep symptom journal.   Patient Instructions  You have lab work done today, we will call you with these results when they are available.  Keep a journal of your symptoms over the next few weeks.  Follow up in about 2 weeks to discuss lab work and further work up if necessary.  Continue PPI for now, as well as any other plan from GI. Follow up with GI if these symptoms are worsening or not improving despite treatment.  Follow up sooner if symptoms worsen despite treatment.

## 2013-11-18 ENCOUNTER — Telehealth: Payer: Self-pay | Admitting: Internal Medicine

## 2013-11-18 NOTE — Telephone Encounter (Signed)
Pt would ike results of labs done 5/6. pls call

## 2013-11-21 NOTE — Telephone Encounter (Signed)
Pls advise.  

## 2013-11-21 NOTE — Telephone Encounter (Signed)
Shared pt lab results with pt. Plan remains the same. Pt will keep journal of symptoms and will return to clinic in 1 week to discuss possible next steps.

## 2013-11-21 NOTE — Telephone Encounter (Signed)
Pt was busy at moment and will call office back when available.

## 2013-11-30 ENCOUNTER — Ambulatory Visit (INDEPENDENT_AMBULATORY_CARE_PROVIDER_SITE_OTHER): Payer: 59 | Admitting: Physician Assistant

## 2013-11-30 ENCOUNTER — Encounter: Payer: Self-pay | Admitting: Physician Assistant

## 2013-11-30 VITALS — BP 98/60 | HR 85 | Temp 98.8°F | Resp 18 | Wt 116.0 lb

## 2013-11-30 DIAGNOSIS — Z09 Encounter for follow-up examination after completed treatment for conditions other than malignant neoplasm: Secondary | ICD-10-CM

## 2013-11-30 DIAGNOSIS — E049 Nontoxic goiter, unspecified: Secondary | ICD-10-CM

## 2013-11-30 DIAGNOSIS — E010 Iodine-deficiency related diffuse (endemic) goiter: Secondary | ICD-10-CM

## 2013-11-30 DIAGNOSIS — R42 Dizziness and giddiness: Secondary | ICD-10-CM

## 2013-11-30 DIAGNOSIS — R61 Generalized hyperhidrosis: Secondary | ICD-10-CM

## 2013-11-30 DIAGNOSIS — R110 Nausea: Secondary | ICD-10-CM

## 2013-11-30 NOTE — Progress Notes (Signed)
Subjective:    Patient ID: [name], female    DOB: 06-Nov-1955, 58 y.o.   MRN: [id number]  HPI Patient is a 58 year old Caucasian female presenting to the clinic for followup of nausea, lightheadedness, and night sweats. Patient was seen 2 weeks ago for the above and lab work was drawn, the results of which were all within normal limits. Patient states that her night sweats seem to be getting better and are happening less frequently, and her lightheadedness seems to be related to being hungry and can be relieved by eating. She also states that she is still experiencing nausea sometimes seeming to occur 2 hours after a meal, but can occur at any time. Previously she has taken Nexium and Carafate to relieve this, however is not working now. She is in no pain, and she has not vomited or had any change in bowel movements. She is a patient with GI and will followup with them if the nausea does not resolve. She was also found on exam to have a slightly enlarged thyroid left greater than right. Her TSH and free T4 2 weeks ago were normal. She is not complaining of any other symptoms. She denies fevers, chills, vomiting, diarrhea, shortness of breath, and chest pain.   Review of Systems As per the history of present illness and otherwise negative  Past Medical History  Diagnosis Date  . IBS (irritable bowel syndrome)     constipation predominant  . Interstitial cystitis   . Lupus   . Hemorrhoids   . Allergy   . Blood transfusion without reported diagnosis     x3  . Heart murmur   . Osteoporosis     osteopenia  . Anxiety     takes Klonopin nighlty  . GERD (gastroesophageal reflux disease)     takes Omeprazole daily  . Nausea     takes Zofran prn  . Insomnia     takes Ambien prn  . Depression     takes Effexor daily  . PONV (postoperative nausea and vomiting)     laryngospasm-1999  . History of bronchitis     winter of 2014  . Pneumonia     hx of;last time 83yrs ago  . History  of migraine     last one a yr ago and takes Zomig prn  . Seasonal allergies     Flonase daily   . Numbness     both legs and related to back  . Arthritis     uses Diclofenac gel;Rheumatoid  . Chronic back pain   . Joint pain   . Joint swelling   . Eczema   . Chronic constipation     Miralax once weekly  . History of kidney stones     passed on her own  . History of bladder infections   . Anemia     after birth of child 65yrs ago   Past Surgical History  Procedure Laterality Date  . Abdominal hysterectomy  2007  . Tonsillectomy    . Excision morton's neuroma Right   . Colonoscopy  02/11/2013 and 12/24/04    internal and external hemorrhoids  . Flexible sigmoidoscopy  03/07/2008    internal and external hemorrhoids  . Upper gastrointestinal endoscopy  10/23/2010    gastritis, irregular Z-line  . Wisdom teeth extracted    . Uterine ablation      reports that she has never smoked. She has never used smokeless tobacco. She reports that she drinks about .  5 ounces of alcohol per week. She reports that she does not use illicit drugs. family history includes Coronary artery disease in her father; Diabetes in an other family member; Diverticulosis in her mother; Hypertension in her mother; Osteoarthritis in her mother. There is no history of Colon cancer. Allergies  Allergen Reactions  . Aspartame And Phenylalanine Other (See Comments)    Migraines  . Beef-Derived Products Other (See Comments)    severe cramping  . Citrus Other (See Comments)    Causes Interstitial cystitis flares  . Penicillins Hives    Denies airway involvement  . Promethazine Hcl Other (See Comments)    Muscle twitching and contracture   . Adhesive [Tape]     Red splotches  . Doxycycline Nausea And Vomiting  . Morphine Sulfate Nausea And Vomiting       Objective:   Physical Exam  Nursing note and vitals reviewed. Constitutional: She is oriented to person, place, and time. She appears well-developed and  well-nourished. No distress.  HENT:  Head: Normocephalic and atraumatic.  Eyes: Conjunctivae and EOM are normal. Pupils are equal, round, and reactive to light.  Neck: Normal range of motion. Neck supple. No JVD present. No tracheal deviation present. Thyromegaly present.  Cardiovascular: Normal rate, regular rhythm, normal heart sounds and intact distal pulses.  Exam reveals no gallop and no friction rub.   No murmur heard. Pulmonary/Chest: Effort normal and breath sounds normal. No stridor. No respiratory distress. She has no wheezes. She has no rales. She exhibits no tenderness.  Abdominal: Soft. Bowel sounds are normal. She exhibits no distension and no mass. There is no tenderness. There is no rebound and no guarding.  Musculoskeletal: Normal range of motion.  Lymphadenopathy:    She has no cervical adenopathy.  Neurological: She is alert and oriented to person, place, and time.  Skin: Skin is warm and dry. No rash noted. She is not diaphoretic. No erythema. No pallor.  Psychiatric: She has a normal mood and affect. Her behavior is normal. Judgment and thought content normal.   Filed Vitals:   11/30/13 1051  BP: 98/60  Pulse: 85  Temp: 98.8 F (37.1 C)  Resp: 18    Lab Results  Component Value Date   WBC 5.1 11/16/2013   HGB 13.3 11/16/2013   HCT 40.4 11/16/2013   PLT 170.0 11/16/2013   GLUCOSE 60* 11/16/2013   CHOL 185 04/14/2013   TRIG 56.0 04/14/2013   HDL 79.80 04/14/2013   LDLCALC 94 04/14/2013   ALT 22 06/13/2012   AST 23 06/13/2012   NA 139 11/16/2013   K 3.9 11/16/2013   CL 104 11/16/2013   CREATININE 0.9 11/16/2013   BUN 13 11/16/2013   CO2 28 11/16/2013   TSH 1.11 11/16/2013   INR 1.0 01/20/2007   HGBA1C 5.9 11/16/2013       Assessment & Plan:  [name] was seen today for follow-up.  Diagnoses and associated orders for this visit:  Follow up - US Soft Tissue Head/Neck; Future  Night sweat Comments: Improving, happening less frequently.  Lightheadedness Comments: Improved,  seems to happen when hungry and resolved by eating.  Nausea alone Comments: Possibly imrpoving, chronic problem, was GERD associated and helped with Nexium and Carafate in the past. Will F/U with GI if symptoms do not improve.  Thyromegaly Comments: Normal TSH and Free T4. Will have US done for completeness of workup. - US Soft Tissue Head/Neck; Future    Plan to follow up in 1 month to  reassess.  Patient Instructions  You will be called to schedule an appointment for a thyroid Ultrasound. We will call you with the results of this when they are available.  Continue symptom diary. Follow up with GI for nausea if it does not improve.  Follow up in 1 month to reassess. Follow up sooner for any other complaints.

## 2013-11-30 NOTE — Progress Notes (Signed)
Pre visit review using our clinic review tool, if applicable. No additional management support is needed unless otherwise documented below in the visit note. 

## 2013-11-30 NOTE — Patient Instructions (Signed)
You will be called to schedule an appointment for a thyroid Ultrasound. We will call you with the results of this when they are available.  Continue symptom diary. Follow up with GI for nausea if it does not improve.  Follow up in 1 month to reassess. Follow up sooner for any other complaints.  Nausea, Adult Nausea means you feel sick to your stomach or need to throw up (vomit). It may be a sign of a more serious problem. If nausea gets worse, you may throw up. If you throw up a lot, you may lose too much body fluid (dehydration). HOME CARE   Get plenty of rest.  Ask your doctor how to replace body fluid losses (rehydrate).  Eat small amounts of food. Sip liquids more often.  Take all medicines as told by your doctor. GET HELP RIGHT AWAY IF:  You have a fever.  You pass out (faint).  You keep throwing up or have blood in your throw up.  You are very weak, have dry lips or a dry mouth, or you are very thirsty (dehydrated).  You have dark or bloody poop (stool).  You have very bad chest or belly (abdominal) pain.  You do not get better after 2 days, or you get worse.  You have a headache. MAKE SURE YOU:  Understand these instructions.  Will watch your condition.  Will get help right away if you are not doing well or get worse. Document Released: 06/19/2011 Document Revised: 09/22/2011 Document Reviewed: 06/19/2011 [facility] Patient Information 2014 [place], [place].

## 2013-12-02 ENCOUNTER — Ambulatory Visit
Admission: RE | Admit: 2013-12-02 | Discharge: 2013-12-02 | Disposition: A | Discharge status: Home / Self Care | Payer: 59 | Source: Ambulatory Visit | Attending: Physician Assistant | Admitting: Physician Assistant

## 2013-12-02 ENCOUNTER — Encounter (INDEPENDENT_AMBULATORY_CARE_PROVIDER_SITE_OTHER): Payer: Self-pay

## 2013-12-02 ENCOUNTER — Telehealth: Payer: Self-pay | Admitting: Physician Assistant

## 2013-12-02 DIAGNOSIS — Z09 Encounter for follow-up examination after completed treatment for conditions other than malignant neoplasm: Secondary | ICD-10-CM

## 2013-12-02 DIAGNOSIS — E010 Iodine-deficiency related diffuse (endemic) goiter: Secondary | ICD-10-CM

## 2013-12-02 NOTE — Telephone Encounter (Signed)
Called pt to discuss thyroid US results. Pt had 79mm solid cyst of R thyroid lobe on [place]. Radiologist recommended monitoring by clinical exam due to size, unless pt was symptomatic of hyperthyroid, or if pt has known risk factors for thyroid carcinoma. Pt is asymptomatic and no known risk factors of thyroid carcinoma, so we will monitor clinically with physical exam for now, and with symptom diary. Pt agreed with this plan, and we will have follow up appointment in about 1 month to reassess.

## 2013-12-26 ENCOUNTER — Ambulatory Visit (INDEPENDENT_AMBULATORY_CARE_PROVIDER_SITE_OTHER): Payer: 59 | Admitting: Physician Assistant

## 2013-12-26 ENCOUNTER — Encounter: Payer: Self-pay | Admitting: Physician Assistant

## 2013-12-26 ENCOUNTER — Telehealth: Payer: Self-pay | Admitting: Cardiovascular Disease

## 2013-12-26 VITALS — BP 140/88 | HR 81 | Temp 98.5°F | Resp 18 | Wt 116.0 lb

## 2013-12-26 DIAGNOSIS — Z8679 Personal history of other diseases of the circulatory system: Secondary | ICD-10-CM

## 2013-12-26 NOTE — Telephone Encounter (Signed)
New message  Pt called states that she was seen per her PCP who advised her to see a cardiologist. Pt experiencing dizziness and nausea. Made appt for 07/15 with scott weaver.. Requests a call back if sooner appt is needed.

## 2013-12-26 NOTE — Patient Instructions (Signed)
You should purchase a home blood pressure cuff and monitor your BPs at home daily for the next few weeks to see if it remains elevated.  If you develop the palpitations again, see if you can check your pulse and document the heart rate.  Monitor the rest of your symptoms for any change.  Maintain fluid hydration.  If symptoms persist, you may benefit from seeing your cardiologist for evaluation.  If you develop the emergency symptoms discussed during visit, seek medical attention immediately.  Follow up in 1 to 3 weeks to reassess, or sooner if symptoms worsen or persist.   Hypertension Hypertension is another name for high blood pressure. High blood pressure may mean that your heart needs to work harder to pump blood. Blood pressure consists of two numbers, which includes a higher number over a lower number (example: 110/72). HOME CARE   Make lifestyle changes as told by your doctor. This may include weight loss and exercise.  Take your blood pressure medicine every day.  Limit how much salt you use.  Stop smoking if you smoke.  Do not use drugs.  Talk to your doctor if you are using decongestants or birth control pills. These medicines might make blood pressure higher.  Females should not drink more than 1 alcoholic drink per day. Males should not drink more than 2 alcoholic drinks per day.  See your doctor as told. GET HELP RIGHT AWAY IF:   You have a blood pressure reading with a top number of 180 or higher.  You get a very bad headache.  You get blurred or changing vision.  You feel confused.  You feel weak, numb, or faint.  You get chest or belly (abdominal) pain.  You throw up (vomit).  You cannot breathe very well. MAKE SURE YOU:   Understand these instructions.  Will watch your condition.  Will get help right away if you are not doing well or get worse. Document Released: 12/17/2007 Document Revised: 09/22/2011 Document Reviewed:  12/17/2007 [facility] Patient Information 2014 [place], [place].

## 2013-12-26 NOTE — Progress Notes (Signed)
Pre visit review using our clinic review tool, if applicable. No additional management support is needed unless otherwise documented below in the visit note. 

## 2013-12-26 NOTE — Progress Notes (Signed)
Subjective:    Patient ID: [name], female    DOB: 11-18-1955, 58 y.o.   MRN: [id number]  Hypertension This is a new problem. The current episode started today. Associated symptoms include malaise/fatigue, palpitations (just faster beats than usual) and sweats. Pertinent negatives include no anxiety, blurred vision, chest pain, headaches, neck pain, orthopnea, peripheral edema, PND or shortness of breath. Risk factors for coronary artery disease include family history and post-menopausal state. Past treatments include nothing. There is no history of angina, kidney disease, CAD/MI, CVA, heart failure, PVD or retinopathy.  Pt presents for HTN identified at work this morning. Pt states that her BP was 160/90 at work this AM. Pt has also had Nausea, lightheaded, sweats last night in bed, and felling of lethargy/fatigue. Pt denies Fevers, chills, vomiting, diarrhea, chest pain, shortness of breath, headache, or syncope.      Review of Systems  Constitutional: Positive for malaise/fatigue. Negative for fever and chills.  Eyes: Negative for blurred vision.  Respiratory: Negative for shortness of breath.   Cardiovascular: Positive for palpitations (just faster beats than usual). Negative for chest pain, orthopnea and PND.  Gastrointestinal: Negative for nausea, vomiting and diarrhea.  Musculoskeletal: Negative for neck pain.  Neurological: Negative for headaches.  All other systems reviewed and are negative.    Past Medical History  Diagnosis Date  . IBS (irritable bowel syndrome)     constipation predominant  . Interstitial cystitis   . Lupus   . Hemorrhoids   . Allergy   . Blood transfusion without reported diagnosis     x3  . Heart murmur   . Osteoporosis     osteopenia  . Anxiety     takes Klonopin nighlty  . GERD (gastroesophageal reflux disease)     takes Omeprazole daily  . Nausea     takes Zofran prn  . Insomnia     takes Ambien prn  . Depression     takes  Effexor daily  . PONV (postoperative nausea and vomiting)     laryngospasm-1999  . History of bronchitis     winter of 2014  . Pneumonia     hx of;last time 31yrs ago  . History of migraine     last one a yr ago and takes Zomig prn  . Seasonal allergies     Flonase daily   . Numbness     both legs and related to back  . Arthritis     uses Diclofenac gel;Rheumatoid  . Chronic back pain   . Joint pain   . Joint swelling   . Eczema   . Chronic constipation     Miralax once weekly  . History of kidney stones     passed on her own  . History of bladder infections   . Anemia     after birth of child 37yrs ago    History   Social History  . Marital Status: Married    Spouse Name: N/A    Number of Children: N/A  . Years of Education: N/A   Occupational History  . RN [employer]   Social History Main Topics  . Smoking status: Never Smoker   . Smokeless tobacco: Never Used  . Alcohol Use: 0.5 oz/week    1 drink(s) per week     Comment: socially  . Drug Use: No  . Sexual Activity: Yes   Other Topics Concern  . Not on file   Social History Narrative  . No  narrative on file    Past Surgical History  Procedure Laterality Date  . Abdominal hysterectomy  2007  . Tonsillectomy    . Excision morton's neuroma Right   . Colonoscopy  02/11/2013 and 12/24/04    internal and external hemorrhoids  . Flexible sigmoidoscopy  03/07/2008    internal and external hemorrhoids  . Upper gastrointestinal endoscopy  10/23/2010    gastritis, irregular Z-line  . Wisdom teeth extracted    . Uterine ablation      Family History  Problem Relation Age of Onset  . Coronary artery disease Father   . Diabetes      uncle  . Diverticulosis Mother   . Hypertension Mother   . Osteoarthritis Mother   . Colon cancer Neg Hx     Allergies  Allergen Reactions  . Aspartame And Phenylalanine Other (See Comments)    Migraines  . Beef-Derived Products Other (See Comments)    severe cramping    . Citrus Other (See Comments)    Causes Interstitial cystitis flares  . Penicillins Hives    Denies airway involvement  . Promethazine Hcl Other (See Comments)    Muscle twitching and contracture   . Adhesive [Tape]     Red splotches  . Doxycycline Nausea And Vomiting  . Morphine Sulfate Nausea And Vomiting    Current Outpatient Prescriptions on File Prior to Visit  Medication Sig Dispense Refill  . albuterol (PROVENTIL HFA;VENTOLIN HFA) 108 (90 BASE) MCG/ACT inhaler Inhale 2 puffs into the lungs every 6 (six) hours as needed for wheezing.  1 Inhaler  0  . calcium-vitamin D (OSCAL WITH D) 500-200 MG-UNIT per tablet Take 2 tablets by mouth every evening.      . cholecalciferol (VITAMIN D) 1000 UNITS tablet Take 1,000 Units by mouth every evening.      . clonazePAM (KLONOPIN) 1 MG tablet Take 1 mg by mouth at bedtime.      . diazepam (VALIUM) 5 MG tablet Take 1-2 tablets (5-10 mg total) by mouth every 6 (six) hours as needed (Spasms).  60 tablet  0  . estradiol (ESTRACE) 1 MG tablet Take 1 mg by mouth every other day.      . fluticasone (FLONASE) 50 MCG/ACT nasal spray Place 2 sprays into the nose daily.  16 g  5  . ibuprofen (ADVIL,MOTRIN) 200 MG tablet Take 400 mg by mouth every 6 (six) hours as needed. For pain      . Multiple Vitamin (MULTIVITAMIN WITH MINERALS) TABS Take 1 tablet by mouth every evening.      . ondansetron (ZOFRAN-ODT) 4 MG disintegrating tablet Take 4 mg by mouth every 8 (eight) hours as needed for nausea.      . sucralfate (CARAFATE) 1 GM/10ML suspension Take 10 mLs (1 g total) by mouth 3 (three) times daily as needed. For digestion  840 mL  5  . venlafaxine (EFFEXOR) 75 MG tablet Take 225 mg by mouth daily.      Marland Kitchen zolmitriptan (ZOMIG) 5 MG tablet Take 5 mg by mouth as needed for migraine.      Marland Kitchen zolpidem (AMBIEN) 10 MG tablet Take 1 tablet (10 mg total) by mouth at bedtime as needed for sleep.  30 tablet  0   No current facility-administered medications on file  prior to visit.    EXAM: BP 140/88  Pulse 81  Temp(Src) 98.5 F (36.9 C) (Oral)  Resp 18  Wt 116 lb (52.617 kg)  SpO2 98%  Objective:   Physical Exam  Nursing note and vitals reviewed. Constitutional: She is oriented to person, place, and time. She appears well-developed and well-nourished. No distress.  HENT:  Head: Normocephalic and atraumatic.  Right Ear: External ear normal.  Left Ear: External ear normal.  bilat tms normal.  Eyes: Conjunctivae and EOM are normal. Pupils are equal, round, and reactive to light.  Neck: Normal range of motion. Neck supple. No JVD present.  Cardiovascular: Normal rate, regular rhythm, normal heart sounds and intact distal pulses.  Exam reveals no gallop and no friction rub.   No murmur heard. Pulmonary/Chest: Effort normal and breath sounds normal. No stridor. No respiratory distress. She has no wheezes. She has no rales. She exhibits no tenderness.  Musculoskeletal: Normal range of motion.  Lymphadenopathy:    She has no cervical adenopathy.  Neurological: She is alert and oriented to person, place, and time.  Skin: Skin is warm and dry. No rash noted. She is not diaphoretic. No erythema. No pallor.  Psychiatric: She has a normal mood and affect. Her behavior is normal. Judgment and thought content normal.    Lab Results  Component Value Date   WBC 5.1 11/16/2013   HGB 13.3 11/16/2013   HCT 40.4 11/16/2013   PLT 170.0 11/16/2013   GLUCOSE 60* 11/16/2013   CHOL 185 04/14/2013   TRIG 56.0 04/14/2013   HDL 79.80 04/14/2013   LDLCALC 94 04/14/2013   ALT 22 06/13/2012   AST 23 06/13/2012   NA 139 11/16/2013   K 3.9 11/16/2013   CL 104 11/16/2013   CREATININE 0.9 11/16/2013   BUN 13 11/16/2013   CO2 28 11/16/2013   TSH 1.11 11/16/2013   INR 1.0 01/20/2007   HGBA1C 5.9 11/16/2013         Assessment & Plan:  Quilla was seen today for hypertension.  Diagnoses and associated orders for this visit:  History of high blood pressure Comments: Normalized  now.  Associated Nausea/lightheadedness. Will have pt monitor home BPs and follow up in a few weeks to reassess.   Pt will monitor symptoms, pulse, and BPs at home.  Pt has cardiologist, feels as though she should see them, will call and schedule appointment.  Return/emergency precautions provided  Plan is to follow up in 1 to 3 weeks to reassess, or sooner if symptoms persist or worsen.  Patient Instructions  You should purchase a home blood pressure cuff and monitor your BPs at home daily for the next few weeks to see if it remains elevated.  If you develop the palpitations again, see if you can check your pulse and document the heart rate.  Monitor the rest of your symptoms for any change.  Maintain fluid hydration.  If symptoms persist, you may benefit from seeing your cardiologist for evaluation.  If you develop the emergency symptoms discussed during visit, seek medical attention immediately.  Follow up in 1 to 3 weeks to reassess, or sooner if symptoms worsen or persist.

## 2013-12-27 ENCOUNTER — Telehealth: Payer: Self-pay | Admitting: Internal Medicine

## 2013-12-27 ENCOUNTER — Other Ambulatory Visit: Payer: Self-pay | Admitting: Internal Medicine

## 2013-12-27 NOTE — Telephone Encounter (Signed)
Pt wanted to let you know she had a terrible headache last night and was vomiting. Pt unsure what her bp was b/c she has no way to check. Also transferred pt to triage.

## 2013-12-27 NOTE — Telephone Encounter (Signed)
Probably a migraine due to history of migraines with the same symptoms. She was experiencing fatigue at the visit as well. She can continue to monitor her symptoms and her blood pressure. If she feels as though her symptoms of fatigue are acutely worsened, or if any of the emergency symptoms we discussed at the visit develop (included in pt avs info), she should seek medical attention immediately.

## 2013-12-27 NOTE — Telephone Encounter (Signed)
Called and spoke with pt and pt is aware.  

## 2013-12-27 NOTE — Telephone Encounter (Signed)
pls advise

## 2013-12-27 NOTE — Telephone Encounter (Signed)
Patient Information:  Caller Name: [name]  Phone: [phone]  Patient: [name], [name]  Gender: Female  DOB: Dec 20, 1955  Age: 58 Years  PCP: Kela Millin (only sees ages 28 and up)  Office Follow Up:  Does the office need to follow up with this patient?: Yes  Instructions For The Office: Please review notes.  Patient would like cal back about the symptoms that she had yesterday 6/15 in the evening.  Can reach patient at  [phone].   Symptoms  Reason For Call & Symptoms: Seen in office yesterday 6/15 because was not feeling well.  Found BP 160/90 in office (usually 100/60)  and BP down to 136/86 by time left office. Was able to finish rest of day at work.  Started headache/migraine  about 5 pm, was like usual migraine with pain across forehead, top of head to back of head.  Headache lasted about 90 minutes with lot of vomiting - this was usual pattern for migraine but had not had migraine headache for about 3 years until now.  Today 6/16 is home from work - does not have headache but feeling just completely wiped out.  Felt office should know since seen yesterday.  Reviewed Health History In EMR: Yes  Reviewed Medications In EMR: Yes  Reviewed Allergies In EMR: Yes  Reviewed Surgeries / Procedures: Yes  Date of Onset of Symptoms: 12/26/2013  Guideline(s) Used:  No Protocol Available - Sick Adult  Headache  Disposition Per Guideline:   Discuss with PCP and Callback by Nurse Today  Reason For Disposition Reached:   Nursing judgment  Advice Given:  Rest:   Lie down in a dark, quiet place and try to relax. Close your eyes and imagine your entire body relaxing.  Apply Cold to the Area:   Apply a cold wet washcloth or cold pack to the forehead for 20 minutes.  Call Back If:  Headache lasts longer than 24 hours  You become worse.  Patient Will Follow Care Advice:  YES

## 2013-12-27 NOTE — Telephone Encounter (Signed)
PT CALLED   RE MESSAGE  PER PT  ALREADY HAS  BEEN TAKEN CARE  OF  HAS APPT  AS  STATED IN  MESSAGE    PT   AWARE NOTHING   AVAILABLE ANY  EARLIER .Adonis Housekeeper

## 2013-12-30 ENCOUNTER — Ambulatory Visit: Payer: 59 | Admitting: Physician Assistant

## 2014-01-04 ENCOUNTER — Ambulatory Visit: Payer: 59 | Admitting: Physician Assistant

## 2014-01-18 ENCOUNTER — Ambulatory Visit (INDEPENDENT_AMBULATORY_CARE_PROVIDER_SITE_OTHER): Payer: 59 | Admitting: Physician Assistant

## 2014-01-18 ENCOUNTER — Encounter: Payer: Self-pay | Admitting: Physician Assistant

## 2014-01-18 VITALS — BP 100/68 | HR 82 | Temp 98.3°F | Resp 18 | Wt 118.0 lb

## 2014-01-18 DIAGNOSIS — Z8679 Personal history of other diseases of the circulatory system: Secondary | ICD-10-CM

## 2014-01-18 LAB — LIPID PANEL
Cholesterol: 175 mg/dL (ref 0–200)
HDL: 72.5 mg/dL (ref >39.00)
LDL Cholesterol: 94 mg/dL (ref 0–99)
NonHDL: 102.5
Total CHOL/HDL Ratio: 2
Triglycerides: 41 mg/dL (ref 0.0–149.0)
VLDL: 8.2 mg/dL (ref 0.0–40.0)

## 2014-01-18 NOTE — Patient Instructions (Signed)
We will call you with the results to your lipid panel whenever they are available.  Keep your followup with cardiology.  Continue to monitor your home blood pressures. Make sure that you get adequate fluid intake.  If emergency symptoms discussed during visit developed, seek medical attention immediately.  Followup as needed, or for worsening or persistent symptoms despite treatment.   Hypertension Hypertension is another name for high blood pressure. High blood pressure forces your heart to work harder to pump blood. A blood pressure reading has two numbers, which includes a higher number over a lower number (example: 110/72). HOME CARE   Have your blood pressure rechecked by your doctor.  Only take medicine as told by your doctor. Follow the directions carefully. The medicine does not work as well if you skip doses. Skipping doses also puts you at risk for problems.  Do not smoke.  Monitor your blood pressure at home as told by your doctor. GET HELP IF:  You think you are having a reaction to the medicine you are taking.  You have repeat headaches or feel dizzy.  You have puffiness (swelling) in your ankles.  You have trouble with your vision. GET HELP RIGHT AWAY IF:   You get a very bad headache and are confused.  You feel weak, numb, or faint.  You get chest or belly (abdominal) pain.  You throw up (vomit).  You cannot breathe very well. MAKE SURE YOU:   Understand these instructions.  Will watch your condition.  Will get help right away if you are not doing well or get worse. Document Released: 12/17/2007 Document Revised: 07/05/2013 Document Reviewed: 04/22/2013 [facility] Patient Information 2015 [place], [place]. This information is not intended to replace advice given to you by your health care provider. Make sure you discuss any questions you have with your health care provider. Hypotension As your heart beats, it forces blood through your body. This force  is called blood pressure. If you have hypotension, you have low blood pressure. When your blood pressure is too low, you may not get enough blood to your brain. You may feel weak, feel lightheaded, have a fast heartbeat, or even pass out (faint). HOME CARE  Drink enough fluids to keep your pee (urine) clear or pale yellow.  Take all medicines as told by your doctor.  Get up slowly after sitting or lying down.  Wear support stockings as told by your doctor.  Maintain a healthy diet by including foods such as fruits, vegetables, nuts, whole grains, and lean meats. GET HELP IF:  You are throwing up (vomiting) or have watery poop (diarrhea).  You have a fever for more than 2-3 days.  You feel more thirsty than usual.  You feel weak and tired. GET HELP RIGHT AWAY IF:   You pass out (faint).  You have chest pain or a fast or irregular heartbeat.  You lose feeling in part of your body.  You cannot move your arms or legs.  You have trouble speaking.  You get sweaty or feel lightheaded. MAKE SURE YOU:   Understand these instructions.  Will watch your condition.  Will get help right away if you are not doing well or get worse. Document Released: 09/24/2009 Document Revised: 03/02/2013 Document Reviewed: 12/31/2012 [facility] Patient Information 2015 [place], [place]. This information is not intended to replace advice given to you by your health care provider. Make sure you discuss any questions you have with your health care provider.

## 2014-01-18 NOTE — Progress Notes (Signed)
Pre visit review using our clinic review tool, if applicable. No additional management support is needed unless otherwise documented below in the visit note. 

## 2014-01-18 NOTE — Progress Notes (Signed)
Subjective:    Patient ID: [name], female    DOB: 10-16-1955, 58 y.o.   MRN: [id number]  HPI Patient is a 58 y.o. female presenting for follow up. Pt was seen 3 weeks ago for blood pressure concerns causing her to have fatigue, palpitations, and headaches occasionally. Pt has been checking her home BPs which have been mostly normotensive. Occasionally she has had systolic BPs in the high 829F, which she correlates with stress and work. She states that she can tell that her BP gets elevated when she is working hard whether at home or at her job. She states that this improves with relaxation and sitting down for a while. She has a cardiologist, and currently has a visit scheduled with them next week. Pt also states that occasionally she feels "woosy" maybe lightheaded. She denies Chest pain, shortness of breath, and syncope. She also denies F/C/N/V/D.   She is fasting today and requesting a cholesterol level be drawn. She states that she would like to have it repeated just to see where it is currently.  Review of Systems As per HPI and otherwise negative.     Past Medical History  Diagnosis Date  . IBS (irritable bowel syndrome)     constipation predominant  . Interstitial cystitis   . Lupus   . Hemorrhoids   . Allergy   . Blood transfusion without reported diagnosis     x3  . Heart murmur   . Osteoporosis     osteopenia  . Anxiety     takes Klonopin nighlty  . GERD (gastroesophageal reflux disease)     takes Omeprazole daily  . Nausea     takes Zofran prn  . Insomnia     takes Ambien prn  . Depression     takes Effexor daily  . PONV (postoperative nausea and vomiting)     laryngospasm-1999  . History of bronchitis     winter of 2014  . Pneumonia     hx of;last time 45yrs ago  . History of migraine     last one a yr ago and takes Zomig prn  . Seasonal allergies     Flonase daily   . Numbness     both legs and related to back  . Arthritis     uses  Diclofenac gel;Rheumatoid  . Chronic back pain   . Joint pain   . Joint swelling   . Eczema   . Chronic constipation     Miralax once weekly  . History of kidney stones     passed on her own  . History of bladder infections   . Anemia     after birth of child 72yrs ago    History   Social History  . Marital Status: Married    Spouse Name: N/A    Number of Children: N/A  . 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 Smoking status: Never Smoker   . Smokeless tobacco: Never Used  . Alcohol Use: 0.5 oz/week    1 drink(s) per week     Comment: socially  . Drug Use: No  . Sexual Activity: Yes   Other Topics Concern  . Not on file   Social History Narrative  . No narrative on file    Past Surgical History  Procedure Laterality Date  . Abdominal hysterectomy  2007  . Tonsillectomy    . Excision  morton's neuroma Right   . Colonoscopy  02/11/2013 and 12/24/04    internal and external hemorrhoids  . Flexible sigmoidoscopy  03/07/2008    internal and external hemorrhoids  . Upper gastrointestinal endoscopy  10/23/2010    gastritis, irregular Z-line  . Wisdom teeth extracted    . Uterine ablation      Family History  Problem Relation Age of Onset  . Coronary artery disease Father   . Diabetes      uncle  . Diverticulosis Mother   . Hypertension Mother   . Osteoarthritis Mother   . Colon cancer Neg Hx     Allergies  Allergen Reactions  . Aspartame And Phenylalanine Other (See Comments)    Migraines  . Beef-Derived Products Other (See Comments)    severe cramping  . Citrus Other (See Comments)    Causes Interstitial cystitis flares  . Penicillins Hives    Denies airway involvement  . Promethazine Hcl Other (See Comments)    Muscle twitching and contracture   . Adhesive [Tape]     Red splotches  . Doxycycline Nausea And Vomiting  . Morphine Sulfate Nausea And Vomiting    Current Outpatient  Prescriptions on File Prior to Visit  Medication Sig Dispense Refill  . albuterol (PROVENTIL HFA;VENTOLIN HFA) 108 (90 BASE) MCG/ACT inhaler Inhale 2 puffs into the lungs every 6 (six) hours as needed for wheezing.  1 Inhaler  0  . calcium-vitamin D (OSCAL WITH D) 500-200 MG-UNIT per tablet Take 2 tablets by mouth every evening.      . cholecalciferol (VITAMIN D) 1000 UNITS tablet Take 1,000 Units by mouth every evening.      . clonazePAM (KLONOPIN) 1 MG tablet Take 1 mg by mouth at bedtime.      . diazepam (VALIUM) 5 MG tablet Take 1-2 tablets (5-10 mg total) by mouth every 6 (six) hours as needed (Spasms).  60 tablet  0  . estradiol (ESTRACE) 1 MG tablet Take 1 mg by mouth every other day.      . fluticasone (FLONASE) 50 MCG/ACT nasal spray Place 2 sprays into the nose daily.  16 g  5  . ibuprofen (ADVIL,MOTRIN) 200 MG tablet Take 400 mg by mouth every 6 (six) hours as needed. For pain      . Multiple Vitamin (MULTIVITAMIN WITH MINERALS) TABS Take 1 tablet by mouth every evening.      . ondansetron (ZOFRAN-ODT) 4 MG disintegrating tablet Take 4 mg by mouth every 8 (eight) hours as needed for nausea.      . sucralfate (CARAFATE) 1 GM/10ML suspension Take 10 mLs (1 g total) by mouth 3 (three) times daily as needed. For digestion  840 mL  5  . venlafaxine (EFFEXOR) 75 MG tablet Take 225 mg by mouth daily.      Marland Kitchen zolpidem (AMBIEN) 10 MG tablet Take 1 tablet (10 mg total) by mouth at bedtime as needed for sleep.  30 tablet  0  . ZOMIG 5 MG tablet TAKE 1 TABLET BY MOUTH AT THE TIME OF HEADACHE, MAY REPEAT IN ONE HOUR IF HEADACHE PERSISTS  9 tablet  0   No current facility-administered medications on file prior to visit.    EXAM: BP 100/68  Pulse 82  Temp(Src) 98.3 F (36.8 C)  Resp 18  Wt 118 lb (53.524 kg)  SpO2 98%     Objective:   Physical Exam  Nursing note and vitals reviewed. Constitutional: She is oriented to person, place, and time.  She appears well-developed and well-nourished.  No distress.  HENT:  Head: Normocephalic and atraumatic.  Eyes: Conjunctivae and EOM are normal. Pupils are equal, round, and reactive to light.  Neck: Normal range of motion.  Cardiovascular: Normal rate, regular rhythm and intact distal pulses.   Pulmonary/Chest: Effort normal and breath sounds normal. No respiratory distress. She exhibits no tenderness.  Musculoskeletal: Normal range of motion. She exhibits no edema.  Neurological: She is alert and oriented to person, place, and time.  Skin: Skin is warm and dry. No rash noted. She is not diaphoretic. No erythema. No pallor.  Psychiatric: She has a normal mood and affect. Her behavior is normal. Judgment and thought content normal.    Lab Results  Component Value Date   WBC 5.1 11/16/2013   HGB 13.3 11/16/2013   HCT 40.4 11/16/2013   PLT 170.0 11/16/2013   GLUCOSE 60* 11/16/2013   CHOL 185 04/14/2013   TRIG 56.0 04/14/2013   HDL 79.80 04/14/2013   LDLCALC 94 04/14/2013   ALT 22 06/13/2012   AST 23 06/13/2012   NA 139 11/16/2013   K 3.9 11/16/2013   CL 104 11/16/2013   CREATININE 0.9 11/16/2013   BUN 13 11/16/2013   CO2 28 11/16/2013   TSH 1.11 11/16/2013   INR 1.0 01/20/2007   HGBA1C 5.9 11/16/2013         Assessment & Plan:  [name] was seen today for follow-up.  Diagnoses and associated orders for this visit:  History of high blood pressure Comments: Normotensive in office today. Not on anti-HTN medication. Pt has appointment to see cardiology. - Lipid Panel    Ordered lipid panel per pts request to recheck. Pt fasting today.  Pt seems to believe stress is related to her occasionally elevated BPs, seems plausible. States she manages these well by trying to relax for a few minutes.  Scheduled appointment with her cardiologist for next week.  Return precautions provided, and patient handout on HTN and hypotension.  Plan to follow up as needed, or for worsening or persistent symptoms despite treatment.  Patient Instructions  We will  call you with the results to your lipid panel whenever they are available.  Keep your followup with cardiology.  Continue to monitor your home blood pressures. Make sure that you get adequate fluid intake.  If emergency symptoms discussed during visit developed, seek medical attention immediately.  Followup as needed, or for worsening or persistent symptoms despite treatment.

## 2014-01-23 ENCOUNTER — Telehealth: Payer: Self-pay | Admitting: Physician Assistant

## 2014-01-23 NOTE — Telephone Encounter (Signed)
FYI

## 2014-01-23 NOTE — Telephone Encounter (Signed)
Patient Information:  Caller Name: Nakeitha  Phone: [phone]  Patient: [name], [name]  Gender: Female  DOB: 27-Jun-1956  Age: 58 Years  PCP: Kela Millin (only sees ages 7 and up)  Office Follow Up:  Does the office need to follow up with this patient?: No  Instructions For The Office: N/A  RN Note:  Patient calling to report blood pressure 160/90 today.  Denies blurred vision, headache, or chest pain.  Currently has visit scheduled with cardiologist on 01/25/14.  Symptoms  Reason For Call & Symptoms: follow up blood pressure  Reviewed Health History In EMR: Yes  Reviewed Medications In EMR: Yes  Reviewed Allergies In EMR: Yes  Reviewed Surgeries / Procedures: Yes  Date of Onset of Symptoms: 01/22/2014  Guideline(s) Used:  High Blood Pressure  Disposition Per Guideline:   See Within 2 Weeks in Office  Reason For Disposition Reached:   BP > 140/90 and is not taking BP medications  Advice Given:  Lifestyle Changes  Maintain a healthy weight. Lose weight if you are overweight.  Do 30 minutes of aerobic physical activity (e.g., brisk walking) most days of the week.  Eat a diet high in fresh fruits and low-fat dairy products. Limit your intake of saturated and total fat. Choose foods that are lower in salt.  If you smoke, you should stop.  If you drink alcohol, you should limit your daily alcohol drinking. Women should have no more than one drink per day. Men should have no more than 2 drinks per day. A drink is defined as 1.5 oz hard liquor (one shot or jigger; 45 ml), 5 oz wine (small glass; 150 ml), or 12 oz beer (one can; 360 ml).  Call Back If:  Headache, blurred vision, difficulty talking, or difficulty walking occurs  Chest pain or difficulty breathing occurs  You become worse.  RN Overrode Recommendation:  Patient Already Has Appt, Document Patient  Patient has visit scheduled with Cardiologist on 01/25/14.

## 2014-01-25 ENCOUNTER — Ambulatory Visit (INDEPENDENT_AMBULATORY_CARE_PROVIDER_SITE_OTHER): Payer: 59 | Admitting: Physician Assistant

## 2014-01-25 ENCOUNTER — Encounter: Payer: Self-pay | Admitting: Physician Assistant

## 2014-01-25 VITALS — BP 120/77 | HR 75 | Ht 63.0 in | Wt 115.0 lb

## 2014-01-25 DIAGNOSIS — R030 Elevated blood-pressure reading, without diagnosis of hypertension: Secondary | ICD-10-CM

## 2014-01-25 DIAGNOSIS — Z8249 Family history of ischemic heart disease and other diseases of the circulatory system: Secondary | ICD-10-CM

## 2014-01-25 DIAGNOSIS — IMO0001 Reserved for inherently not codable concepts without codable children: Secondary | ICD-10-CM

## 2014-01-25 NOTE — Patient Instructions (Signed)
Your physician recommends that you continue on your current medications as directed. Please refer to the Current Medication list given to you today.  Your physician has requested that you have an exercise tolerance test. For further information please visit [email or web address]. Please also follow instruction sheet, as given. (schedule with Richardson Dopp if possible.)  Your physician recommends that you schedule a follow-up appointment in: 4-6 weeks with Dr Johnsie Cancel

## 2014-01-25 NOTE — Progress Notes (Signed)
Cardiology Office Note    Date:  01/25/2014   ID:  [name], DOB [birth year], MRN [id number]  PCP:  Carol Hurter, MD  Cardiologist:  Dr. Jenkins Rouge      History of Present Illness: [name] is a 58 y.o. female with a history of IBS, anxiety, GERD, depression. She was evaluated by Dr. Johnsie Cancel in 05/2012 for palpitations. Event monitor demonstrated no significant arrhythmias. Stress echocardiogram was normal.    She returns for evaluation of BP.  She has noted episodes of nausea since mid June with assoc high BP.  Highest BP seen has been 164/98.  She checks her BP at work.  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  She denies any other assoc symptoms.  She has had some HAs.  She denies chest pain, syncope, dyspnea, orthopnea, PND, edema.  She denies TIA or AFugax symptoms.  No real flushing or diaphoresis associated.     Studies:   - Myoview (03/2010): Overall Impression  Exercise Capacity: Good exercise capacity.  BP Response: Normal blood pressure response.  Clinical Symptoms: Short of breath, fatigue.  ECG Impression: Insignificant upsloping ST segment depression.  Overall Impression: Normal stress nuclear study.    - ETT-Echo (04/2012): Stress echo results: This is intrepreted as a normal stress echo. There is no evidence of ischemia. The LV functin is normal    - Event Monitor (03/2012):  No significant findings.   Recent Labs: 11/16/2013: Creatinine 0.9; Hemoglobin 13.3; Potassium 3.9; TSH 1.11  01/18/2014: HDL Cholesterol by NMR 72.50; LDL (calc) 94   Wt Readings from Last 3 Encounters:  01/25/14 115 lb (52.164 kg)  01/18/14 118 lb (53.524 kg)  12/26/13 116 lb (52.617 kg)     Past Medical History  Diagnosis Date  . IBS (irritable bowel syndrome)     constipation predominant  . Interstitial cystitis   . Lupus   . Hemorrhoids   . Allergy   . Blood transfusion without reported diagnosis     x3  . Heart murmur   . Osteoporosis    osteopenia  . Anxiety     takes Klonopin nighlty  . GERD (gastroesophageal reflux disease)     takes Omeprazole daily  . Nausea     takes Zofran prn  . Insomnia     takes Ambien prn  . Depression     takes Effexor daily  . PONV (postoperative nausea and vomiting)     laryngospasm-1999  . History of bronchitis     winter of 2014  . Pneumonia     hx of;last time 73yrs ago  . History of migraine     last one a yr ago and takes Zomig prn  . Seasonal allergies     Flonase daily   . Numbness     both legs and related to back  . Arthritis     uses Diclofenac gel;Rheumatoid  . Chronic back pain   . Joint pain   . Joint swelling   . Eczema   . Chronic constipation     Miralax once weekly  . History of kidney stones     passed on her own  . History of bladder infections   . Anemia     after birth of child 29yrs ago    Current Outpatient Prescriptions  Medication Sig Dispense Refill  . albuterol (PROVENTIL HFA;VENTOLIN HFA) 108 (90 BASE) MCG/ACT inhaler Inhale 2 puffs into the lungs every 6 (six) hours as needed  for wheezing.  1 Inhaler  0  . calcium-vitamin D (OSCAL WITH D) 500-200 MG-UNIT per tablet Take 2 tablets by mouth every evening.      . cholecalciferol (VITAMIN D) 1000 UNITS tablet Take 1,000 Units by mouth every evening.      . clonazePAM (KLONOPIN) 1 MG tablet Take 1 mg by mouth at bedtime.      . diazepam (VALIUM) 5 MG tablet Take 1-2 tablets (5-10 mg total) by mouth every 6 (six) hours as needed (Spasms).  60 tablet  0  . estradiol (ESTRACE) 1 MG tablet Take 1 mg by mouth every other day.      . fluticasone (FLONASE) 50 MCG/ACT nasal spray Place 2 sprays into the nose daily.  16 g  5  . ibuprofen (ADVIL,MOTRIN) 200 MG tablet Take 400 mg by mouth every 6 (six) hours as needed. For pain      . Multiple Vitamin (MULTIVITAMIN WITH MINERALS) TABS Take 1 tablet by mouth every evening.      Marland Kitchen omeprazole (PRILOSEC) 20 MG capsule Take 20 mg by mouth daily.      .  ondansetron (ZOFRAN-ODT) 4 MG disintegrating tablet Take 4 mg by mouth every 8 (eight) hours as needed for nausea.      . sucralfate (CARAFATE) 1 GM/10ML suspension Take 10 mLs (1 g total) by mouth 3 (three) times daily as needed. For digestion  840 mL  5  . venlafaxine (EFFEXOR) 75 MG tablet Take 225 mg by mouth daily.      Marland Kitchen zolpidem (AMBIEN) 10 MG tablet Take 1 tablet (10 mg total) by mouth at bedtime as needed for sleep.  30 tablet  0  . ZOMIG 5 MG tablet TAKE 1 TABLET BY MOUTH AT THE TIME OF HEADACHE, MAY REPEAT IN ONE HOUR IF HEADACHE PERSISTS  9 tablet  0   No current facility-administered medications for this visit.    Allergies:   Aspartame and phenylalanine; Beef-derived products; Citrus; Penicillins; Promethazine hcl; Adhesive; Doxycycline; and Morphine sulfate   Social History:  The patient  reports that she has never smoked. She has never used smokeless tobacco. She reports that she drinks about .5 ounces of alcohol per week. She reports that she does not use illicit drugs.   Family History:  The patient's family history includes Cancer in her maternal grandfather; Coronary artery disease in her father; Depression in her father, mother, paternal grandmother, and sister; Diabetes in an other family member; Diverticulosis in her mother; Heart attack in her father; Hyperlipidemia in her father, paternal grandfather, and another family member; Hypertension in her father, maternal grandfather, maternal grandmother, and mother; Osteoarthritis in her mother; Stroke in her maternal grandmother and paternal grandfather. There is no history of Colon cancer.   ROS:  Please see the history of present illness.      All other systems reviewed and negative.   PHYSICAL EXAM: VS:  BP 120/77  Pulse 75  Ht 5\' 3"  (1.6 m)  Wt 115 lb (52.164 kg)  BMI 20.38 kg/m2 Well nourished, well developed, in no acute distress HEENT: normal Neck: no JVD Vascular:  No carotid bruits Cardiac:  normal S1, S2; RRR;  no murmur Lungs:  clear to auscultation bilaterally, no wheezing, rhonchi or rales Abd: soft, nontender, no hepatomegaly Ext: no edema Skin: warm and dry Neuro:  CNs 2-12 intact, no focal abnormalities noted  EKG:  NSR, HR 75, rightward axis, no change from prior tracing    ASSESSMENT AND PLAN:  1. Elevated blood pressure:  She is normotensive today.  She had normal BP at her PCP's office recently as well.  All of her high BP readings are associated with nausea.  She has a lot of GI issues.  Question if this is related to GERD or IBS.  She had a normal TSH and creatinine 2 mos ago.  No cardiomegaly on CXR last year.  No symptoms of adrenaline secreting tumors.  I will arrange an ETT to screen for CAD as well as to assess for abnormal BP response to exercise. Could consider 24 hr BP monitor, renal artery [place] or 24 hr urine for catecholamines and metanephrines based upon results.  Consider add low dose anti-HTN med if abnormal BP response to exercise.  2. Family history of early CAD:  Arrange ETT as noted. 3. Disposition:  F/u with Dr. Jenkins Rouge in 4-6 weeks.    Signed, Versie Starks, MHS 01/25/2014 10:55 AM    [facility] [place], [place], [place]  [address] Phone: [phone]; Fax: [phone]

## 2014-01-26 ENCOUNTER — Other Ambulatory Visit: Payer: Self-pay | Admitting: Dermatology

## 2014-01-27 ENCOUNTER — Telehealth: Payer: Self-pay | Admitting: Physician Assistant

## 2014-01-27 ENCOUNTER — Telehealth: Payer: Self-pay | Admitting: Internal Medicine

## 2014-01-27 NOTE — Telephone Encounter (Signed)
I cb pt about dermatoligist visit yesterday. Pt states she saw Dr. Pablo Ledger 7/16 and he took a Bx from leg which he tells pt he feels is Vasculitis, most likely caused from elevated BP problems. BP today was 130/86 @ 7am. Advised I will d/w PA and cb if he feels that anything may need to be done with her BP meds. Pt said ok and thank you. Pt states Dr. Pablo Ledger gave an ointment to use on legs right now. Pt is scheduled for stress test 02/22/14 @ [facility].

## 2014-01-27 NOTE — Telephone Encounter (Signed)
New message     FYI Red spots was discovered on legs yesterday--dermatologist biopsy.  Thinks it is vasculatitis

## 2014-01-27 NOTE — Telephone Encounter (Signed)
Pt was not seen by me yesterday. Pt was seen over a week ago. Saw cardiology 2 days ago. Possible mix up?

## 2014-01-27 NOTE — Telephone Encounter (Signed)
Pt was seen by matt on yesterday and wanted to inform him that the dermatologist did an biospy and stated that it was vasculitis, pt would like to touch bases with [facility] and see what he thinks about and what she should do next

## 2014-01-27 NOTE — Telephone Encounter (Signed)
Called and spoke with pt and pt states this message was more of an [place] for [place].

## 2014-01-27 NOTE — Telephone Encounter (Signed)
Left a message for pt to return call.  Need clarification; pt last seen Rodman Key on 01/18/14.  Pt was seen by cardiology on 01/25/14 (maybe message for for cardiology).

## 2014-01-31 NOTE — Telephone Encounter (Signed)
Please request notes from dermatologist. Richardson Dopp, PA-C   01/31/2014 9:17 PM

## 2014-02-02 NOTE — Telephone Encounter (Signed)
lmptcb to find out where dermatologist is. I have googled but not seeing Dr. Pablo Ledger for derm.

## 2014-02-03 ENCOUNTER — Encounter: Payer: Self-pay | Admitting: Cardiovascular Disease

## 2014-02-03 ENCOUNTER — Telehealth: Payer: Self-pay | Admitting: Physician Assistant

## 2014-02-03 NOTE — Telephone Encounter (Signed)
New message          Dermatologist: [facility] [phone]

## 2014-02-03 NOTE — Telephone Encounter (Signed)
Ptcb to let me now who her dermatologist is; [facility] [phone]. I lmom at derm office for recent Bx results and note to be faxed to [place]. [place]

## 2014-02-03 NOTE — Telephone Encounter (Signed)
Ptcb to let me now who her dermatologist is; [facility] [phone]. I lmom at derm office for recent Bx results and note to be faxed to [place]. [place]

## 2014-02-08 NOTE — Telephone Encounter (Signed)
I lmom at [facility] in the Med Rec dept to see if the pathology report is in on pt from her recent biopsy that was done, possible vasculitis. We did get the lab report scanned in from them, which I will show to [place].

## 2014-02-17 ENCOUNTER — Telehealth (HOSPITAL_COMMUNITY): Payer: Self-pay

## 2014-02-17 NOTE — Telephone Encounter (Signed)
Encounter complete. 

## 2014-02-22 ENCOUNTER — Ambulatory Visit (HOSPITAL_COMMUNITY)
Admission: RE | Admit: 2014-02-22 | Discharge: 2014-02-22 | Disposition: A | Discharge status: Home / Self Care | Payer: 59 | Source: Ambulatory Visit | Attending: Physician Assistant | Admitting: Physician Assistant

## 2014-02-22 DIAGNOSIS — R030 Elevated blood-pressure reading, without diagnosis of hypertension: Secondary | ICD-10-CM | POA: Insufficient documentation

## 2014-02-22 DIAGNOSIS — IMO0001 Reserved for inherently not codable concepts without codable children: Secondary | ICD-10-CM

## 2014-02-22 DIAGNOSIS — Z8249 Family history of ischemic heart disease and other diseases of the circulatory system: Secondary | ICD-10-CM | POA: Insufficient documentation

## 2014-02-22 NOTE — Procedures (Signed)
Exercise Treadmill Test  Pre-Exercise Testing Evaluation  NSR, nonspecific 0.5 mm horizontal ST depression in the inferior leads and V5  Test  Exercise Tolerance Test Ordering MD: Jenkins Rouge, MD  Interpreting MD:   Unique Test No: 1  Treadmill:  1  Indication for ETT: chest pain - rule out ischemia  Contraindication to ETT: No   Stress Modality: exercise - treadmill  Cardiac Imaging Performed: non   Protocol: standard Bruce - maximal  Max BP:  166/73  Max MPHR (bpm):  163 85% MPR (bpm): 138  MPHR obtained (bpm):  173 % MPHR obtained:  102  Reached 85% MPHR (min:sec):  6:10 Total Exercise Time (min-sec):  9:24  Workload in METS:  10.70 Borg Scale:   Reason ETT Terminated:  fatigue    ST Segment Analysis At Rest: non-specific ST segment depression With Exercise: pseudonormalization of ST depression  Other Information Arrhythmia:  at peak exercise there are frequent PVCs in a pattern of bigeminy and an isolated ventricular couplet Angina during ETT:  absent (0) Quality of ETT:  diagnostic  ETT Interpretation:  normal - no evidence of ischemia by ST analysis  Comments: Good exercise tolerance, similar to 2013 study. Normotensive response to exercise. PVCs increase in frequency with exercise   Sanda Klein, MD, [facility] [phone] office [phone] pager

## 2014-02-24 ENCOUNTER — Telehealth: Payer: Self-pay | Admitting: Physician Assistant

## 2014-02-24 DIAGNOSIS — I493 Ventricular premature depolarization: Secondary | ICD-10-CM

## 2014-02-24 DIAGNOSIS — R002 Palpitations: Secondary | ICD-10-CM

## 2014-02-24 DIAGNOSIS — I10 Essential (primary) hypertension: Secondary | ICD-10-CM

## 2014-02-24 MED ORDER — METOPROLOL SUCCINATE ER 25 MG PO TB24: 12.5000 mg | ORAL_TABLET | Freq: Every day | ORAL | Status: DC

## 2014-02-24 NOTE — Telephone Encounter (Signed)
pt notified per Richardson Dopp, PA and Dr. Johnsie Cancel to have pt start Toprol xl 12.5 mg daily, Rx sent to [place]. Echo scheduled for 8/19, Nish[place] 04/06/14. Pt verbalized understanding to Plan of Care.

## 2014-02-24 NOTE — Telephone Encounter (Signed)
New message ° ° ° ° °Want stress test results °

## 2014-02-27 ENCOUNTER — Ambulatory Visit (INDEPENDENT_AMBULATORY_CARE_PROVIDER_SITE_OTHER): Payer: 59 | Admitting: Family

## 2014-02-27 ENCOUNTER — Encounter: Payer: Self-pay | Admitting: Family

## 2014-02-27 VITALS — BP 120/72 | HR 87 | Temp 99.0°F | Wt 120.0 lb

## 2014-02-27 DIAGNOSIS — L299 Pruritus, unspecified: Secondary | ICD-10-CM

## 2014-02-27 DIAGNOSIS — L237 Allergic contact dermatitis due to plants, except food: Secondary | ICD-10-CM

## 2014-02-27 DIAGNOSIS — L255 Unspecified contact dermatitis due to plants, except food: Secondary | ICD-10-CM

## 2014-02-27 MED ORDER — METHYLPREDNISOLONE ACETATE 40 MG/ML IJ SUSP
80.0000 mg | Freq: Once | INTRAMUSCULAR | Status: AC
  Administered 2014-02-27: 80 mg via INTRAMUSCULAR

## 2014-02-27 NOTE — Patient Instructions (Signed)

## 2014-02-27 NOTE — Progress Notes (Signed)
Pre visit review using our clinic review tool, if applicable. No additional management support is needed unless otherwise documented below in the visit note. 

## 2014-02-27 NOTE — Progress Notes (Signed)
Subjective:    Patient ID: [name], female    DOB: [birth year], 57 y.o.   MRN: [id number]  HPI 58 year old white female, nonsmoker with a history of hypertension is in today with complaints of a rate, itchy rash to her left arm and left torso as a result of coming in contact with poison ivy. Has been applying over-the-counter medication without relief. Has a history of poison ivy in the past.   Review of Systems  Constitutional: Negative.   Respiratory: Negative.  Negative for wheezing.   Cardiovascular: Negative.   Gastrointestinal: Negative.   Endocrine: Negative.   Skin: Positive for rash.       Rash to the left arm and left torso  Allergic/Immunologic: Positive for environmental allergies.       Contact dermatitis   Neurological: Negative.   Psychiatric/Behavioral: Negative.    Past Medical History  Diagnosis Date  . IBS (irritable bowel syndrome)     constipation predominant  . Interstitial cystitis   . Lupus   . Hemorrhoids   . Allergy   . Blood transfusion without reported diagnosis     x3  . Heart murmur   . Osteoporosis     osteopenia  . Anxiety     takes Klonopin nighlty  . GERD (gastroesophageal reflux disease)     takes Omeprazole daily  . Nausea     takes Zofran prn  . Insomnia     takes Ambien prn  . Depression     takes Effexor daily  . PONV (postoperative nausea and vomiting)     laryngospasm-1999  . History of bronchitis     winter of 2014  . Pneumonia     hx of;last time 60yrs ago  . History of migraine     last one a yr ago and takes Zomig prn  . Seasonal allergies     Flonase daily   . Numbness     both legs and related to back  . Arthritis     uses Diclofenac gel;Rheumatoid  . Chronic back pain   . Joint pain   . Joint swelling   . Eczema   . Chronic constipation     Miralax once weekly  . History of kidney stones     passed on her own  . History of bladder infections   . Anemia     after birth of child 22yrs ago     History   Social History  . Marital Status: Married    Spouse Name: N/A    Number of Children: N/A  . Years of Education: N/A   Occupational History  . RN [employer]   Social History Main Topics  . Smoking status: Never Smoker   . Smokeless tobacco: Never Used  . Alcohol Use: 0.5 oz/week    1 drink(s) per week     Comment: socially  . Drug Use: No  . Sexual Activity: Yes   Other Topics Concern  . Not on file   Social History Narrative  . No narrative on file    Past Surgical History  Procedure Laterality Date  . Abdominal hysterectomy  2007  . Tonsillectomy    . Excision morton's neuroma Right   . Colonoscopy  02/11/2013 and 12/24/04    internal and external hemorrhoids  . Flexible sigmoidoscopy  03/07/2008    internal and external hemorrhoids  . Upper gastrointestinal endoscopy  10/23/2010    gastritis, irregular Z-line  . Wisdom teeth extracted    .  Uterine ablation      Family History  Problem Relation Age of Onset  . Coronary artery disease Father   . Diabetes      uncle  . Diverticulosis Mother   . Hypertension Mother   . Osteoarthritis Mother   . Colon cancer Neg Hx   . Heart attack Father   . Hypertension Father   . Hypertension Maternal Grandmother   . Hypertension Maternal Grandfather   . Stroke Maternal Grandmother   . Stroke Paternal Grandfather   . Cancer Maternal Grandfather   . Depression Father   . Depression Mother   . Depression Paternal Grandmother   . Depression Sister   . Hyperlipidemia Father   . Hyperlipidemia    . Hyperlipidemia Paternal Grandfather     Allergies  Allergen Reactions  . Aspartame And Phenylalanine Other (See Comments)    Migraines  . Beef-Derived Products Other (See Comments)    severe cramping  . Citrus Other (See Comments)    Causes Interstitial cystitis flares  . Penicillins Hives    Denies airway involvement  . Promethazine Hcl Other (See Comments)    Muscle twitching and contracture   .  Adhesive [Tape]     Red splotches  . Doxycycline Nausea And Vomiting  . Morphine Sulfate Nausea And Vomiting    Current Outpatient Prescriptions on File Prior to Visit  Medication Sig Dispense Refill  . albuterol (PROVENTIL HFA;VENTOLIN HFA) 108 (90 BASE) MCG/ACT inhaler Inhale 2 puffs into the lungs every 6 (six) hours as needed for wheezing.  1 Inhaler  0  . calcium-vitamin D (OSCAL WITH D) 500-200 MG-UNIT per tablet Take 2 tablets by mouth every evening.      . cholecalciferol (VITAMIN D) 1000 UNITS tablet Take 1,000 Units by mouth every evening.      . clonazePAM (KLONOPIN) 1 MG tablet Take 1 mg by mouth at bedtime.      . diazepam (VALIUM) 5 MG tablet Take 1-2 tablets (5-10 mg total) by mouth every 6 (six) hours as needed (Spasms).  60 tablet  0  . estradiol (ESTRACE) 1 MG tablet Take 1 mg by mouth every other day.      . fluticasone (FLONASE) 50 MCG/ACT nasal spray Place 2 sprays into the nose daily.  16 g  5  . ibuprofen (ADVIL,MOTRIN) 200 MG tablet Take 400 mg by mouth every 6 (six) hours as needed. For pain      . metoprolol succinate (TOPROL-XL) 25 MG 24 hr tablet Take 0.5 tablets (12.5 mg total) by mouth daily.  90 tablet  3  . Multiple Vitamin (MULTIVITAMIN WITH MINERALS) TABS Take 1 tablet by mouth every evening.      Marland Kitchen omeprazole (PRILOSEC) 20 MG capsule Take 20 mg by mouth daily.      . ondansetron (ZOFRAN-ODT) 4 MG disintegrating tablet Take 4 mg by mouth every 8 (eight) hours as needed for nausea.      . sucralfate (CARAFATE) 1 GM/10ML suspension Take 10 mLs (1 g total) by mouth 3 (three) times daily as needed. For digestion  840 mL  5  . venlafaxine (EFFEXOR) 75 MG tablet Take 225 mg by mouth daily.      Marland Kitchen zolpidem (AMBIEN) 10 MG tablet Take 1 tablet (10 mg total) by mouth at bedtime as needed for sleep.  30 tablet  0  . ZOMIG 5 MG tablet TAKE 1 TABLET BY MOUTH AT THE TIME OF HEADACHE, MAY REPEAT IN ONE HOUR IF HEADACHE PERSISTS  9 tablet  0   No current  facility-administered medications on file prior to visit.    BP 120/72  Pulse 87  Temp(Src) 99 F (37.2 C) (Oral)  Wt 120 lb (54.432 kg)  SpO2 96%chart    Objective:   Physical Exam  Constitutional: She is oriented to person, place, and time. She appears well-developed and well-nourished.  Neck: Normal range of motion. Neck supple.  Cardiovascular: Normal rate, regular rhythm and normal heart sounds.   Pulmonary/Chest: Effort normal and breath sounds normal.  Musculoskeletal: Normal range of motion.  Neurological: She is alert and oriented to person, place, and time.  Skin: Rash noted. There is erythema.  Rash to left arm and left torso. Erythematous and urticarial.   Psychiatric: She has a normal mood and affect.          Assessment & Plan:  Aijalon was seen today for poison ivy.  Diagnoses and associated orders for this visit:  Poison ivy - methylPREDNISolone acetate (DEPO-MEDROL) injection 80 mg; Inject 2 mLs (80 mg total) into the muscle once.  Pruritus   Call with any questions or concerns. Recheck as scheduled and as needed.

## 2014-02-28 ENCOUNTER — Ambulatory Visit: Payer: 59 | Admitting: Physician Assistant

## 2014-03-01 ENCOUNTER — Ambulatory Visit (HOSPITAL_COMMUNITY): Payer: 59 | Attending: Cardiovascular Disease

## 2014-03-01 DIAGNOSIS — I493 Ventricular premature depolarization: Secondary | ICD-10-CM

## 2014-03-01 DIAGNOSIS — R002 Palpitations: Secondary | ICD-10-CM

## 2014-03-01 DIAGNOSIS — I10 Essential (primary) hypertension: Secondary | ICD-10-CM

## 2014-03-01 NOTE — Progress Notes (Signed)
2D Echo completed. 03/01/2014 

## 2014-03-03 ENCOUNTER — Telehealth: Payer: Self-pay | Admitting: Cardiovascular Disease

## 2014-03-03 NOTE — Telephone Encounter (Signed)
PRELIMINARY   REPORT  GIVEN  TO  PT  NORMAL   EF  VALVES OKAY WITH   NO  STENOSIS  OR  REGURG  NOTED    WILL  GIVE   OFFICIAL  RESULTS ONCE  REVIEWED  BY MD  PT  AGREES WITH PLAN./CY

## 2014-03-03 NOTE — Telephone Encounter (Signed)
New message ° ° ° ° ° °Want echo results °

## 2014-03-04 ENCOUNTER — Encounter: Payer: Self-pay | Admitting: Physician Assistant

## 2014-04-06 ENCOUNTER — Ambulatory Visit (INDEPENDENT_AMBULATORY_CARE_PROVIDER_SITE_OTHER): Payer: 59 | Admitting: Cardiovascular Disease

## 2014-04-06 VITALS — BP 114/80 | HR 70 | Ht 63.0 in | Wt 122.0 lb

## 2014-04-06 DIAGNOSIS — R002 Palpitations: Secondary | ICD-10-CM

## 2014-04-06 MED ORDER — METOPROLOL TARTRATE 25 MG PO TABS: 25.0000 mg | ORAL_TABLET | Freq: Two times a day (BID) | ORAL | Status: DC

## 2014-04-06 NOTE — Patient Instructions (Addendum)
Your physician wants you to follow-up in:   [place] will receive a reminder letter in the mail two months in advance. If you don't receive a letter, please call our office to schedule the follow-up appointment. Your physician has recommended you make the following change in your medication:  START LOPRESSOR   25 MG   TWICE  DAILY  STOP  METOPROLOL  12.5 MG

## 2014-04-06 NOTE — Progress Notes (Signed)
Patient ID: [name], female   DOB: Nov 29, 1955, 58 y.o.   MRN: [id number] 58 year old white female referred for palpitations in 2013  . Patient's electrolytes, kidney function, thyroid function studies and d-dimer were all normal. . Family history of premature CAD. S/P hysterectomy and stopped hormones recently  Occasionally chest pain with them No syncope.   She drinks 1/2 cup of coffee in the morning. No excess ETOH   Normal stress echo 10/13  Event monitor 9/18 No significant findings  Echo 8/15 normal EF 60-65%  ETT normal 8/15  With PVC;s  Better with additon of beta blocker     ROS: Denies fever, malais, weight loss, blurry vision, decreased visual acuity, cough, sputum, SOB, hemoptysis, pleuritic pain, palpitaitons, heartburn, abdominal pain, melena, lower extremity edema, claudication, or rash.  All other systems reviewed and negative  General: Affect appropriate Healthy:  appears stated age 16: normal Neck supple with no adenopathy JVP normal no bruits no thyromegaly Lungs clear with no wheezing and good diaphragmatic motion Heart:  S1/S2 no murmur, no rub, gallop or click PMI normal Abdomen: benighn, BS positve, no tenderness, no AAA no bruit.  No HSM or HJR Distal pulses intact with no bruits No edema Neuro non-focal Skin warm and dry No muscular weakness   Current Outpatient Prescriptions  Medication Sig Dispense Refill  . cholecalciferol (VITAMIN D) 1000 UNITS tablet Take 1,000 Units by mouth every evening.      Marland Kitchen estradiol (ESTRACE) 1 MG tablet Take 1 mg by mouth every other day.      Marland Kitchen FLUoxetine (PROZAC) 20 MG capsule Take 20 mg by mouth daily.      Marland Kitchen ibuprofen (ADVIL,MOTRIN) 200 MG tablet Take 400 mg by mouth every 6 (six) hours as needed. For pain      . metoprolol succinate (TOPROL-XL) 25 MG 24 hr tablet Take 0.5 tablets (12.5 mg total) by mouth daily.  90 tablet  3  . mirtazapine (REMERON) 15 MG tablet Take 15 mg by mouth at bedtime.      .  Multiple Vitamin (MULTIVITAMIN WITH MINERALS) TABS Take 1 tablet by mouth every evening.      Marland Kitchen omeprazole (PRILOSEC) 20 MG capsule Take 20 mg by mouth daily.      . sucralfate (CARAFATE) 1 GM/10ML suspension Take 10 mLs (1 g total) by mouth 3 (three) times daily as needed. For digestion  840 mL  5  . calcium-vitamin D (OSCAL WITH D) 500-200 MG-UNIT per tablet Take 2 tablets by mouth every evening.      . clonazePAM (KLONOPIN) 1 MG tablet Take 1 mg by mouth at bedtime.      . diazepam (VALIUM) 5 MG tablet Take 1-2 tablets (5-10 mg total) by mouth every 6 (six) hours as needed (Spasms).  60 tablet  0  . fluticasone (FLONASE) 50 MCG/ACT nasal spray Place 2 sprays into the nose daily.  16 g  5  . ondansetron (ZOFRAN-ODT) 4 MG disintegrating tablet Take 4 mg by mouth every 8 (eight) hours as needed for nausea.      Marland Kitchen venlafaxine (EFFEXOR) 75 MG tablet Take 225 mg by mouth daily.      Marland Kitchen zolpidem (AMBIEN) 10 MG tablet Take 1 tablet (10 mg total) by mouth at bedtime as needed for sleep.  30 tablet  0  . ZOMIG 5 MG tablet TAKE 1 TABLET BY MOUTH AT THE TIME OF HEADACHE, MAY REPEAT IN ONE HOUR IF HEADACHE PERSISTS  9 tablet  0  No current facility-administered medications for this visit.    Allergies  Aspartame and phenylalanine; Beef-derived products; Citrus; Penicillins; Promethazine hcl; Adhesive; Doxycycline; and Morphine sulfate  Electrocardiogram:  SR rate 75 normal   Assessment and Plan

## 2014-04-06 NOTE — Assessment & Plan Note (Signed)
Correlate with PVCls  EF normal No CAD  Increased with ETT so beta blocker added and improved  She thinks she needs med bid Will change to lopressor 25 bid  F/u 6 months

## 2014-04-06 NOTE — Assessment & Plan Note (Signed)
Well controlled.  Continue current medications and low sodium Dash type diet.    

## 2014-04-12 ENCOUNTER — Ambulatory Visit (INDEPENDENT_AMBULATORY_CARE_PROVIDER_SITE_OTHER): Payer: 59

## 2014-04-12 DIAGNOSIS — Z23 Encounter for immunization: Secondary | ICD-10-CM

## 2014-05-17 ENCOUNTER — Telehealth: Payer: Self-pay | Admitting: Cardiovascular Disease

## 2014-05-17 NOTE — Telephone Encounter (Signed)
Pt returned call

## 2014-05-17 NOTE — Telephone Encounter (Signed)
She has normal EF and no structural heart disease  Ok to change to short acting bid lopressor but doubt this is related to her med/heart  Should f/u with primary

## 2014-05-17 NOTE — Telephone Encounter (Signed)
Left pt a message to call back. 

## 2014-05-17 NOTE — Telephone Encounter (Signed)
Calling stating she has had wt gain from 118 lbs to 125 lbs over past few weeks.  Wondering if the Metoprolol succinate 25 mg daily is causing the wt gain.  From last office note Dr. Johnsie Cancel had stopped the Metoprolol Succinate and had switched her to the tartrate 25 mg BID.  States she has slight edema in ankles even wearing support hose. Denies SOB. BP normal. She has been watching her diet closely.  Discussed that beta blockers can cause sl weight gain usually 2-3 lbs but not 7 lbs. States she had been taking the Succinate because Dr. Johnsie Cancel advised her to finish the bottle and then start on the Tartrate.  She will get Rx tomorrow.  Advised will forward message to Dr. Johnsie Cancel for review.

## 2014-05-17 NOTE — Telephone Encounter (Signed)
New message      Pt has gained 10bs since she started taking metoprolol.  Could it be the medication?

## 2014-05-19 NOTE — Telephone Encounter (Signed)
Returned call to pt. As stated before she is not happy that she has gained weight since starting metoprolol succinate.  She is aware of Dr. Kyla Balzarine review and has decided against starting the metoprolol tartrate.  She may contact her pcp if the weight gain persists. She stopped her metoprolol succinate on 05/17/14. I did review recorded weights over the past 9 months & pt states they do not correlate with her home weights of 115-118  Forwarded to Dr. Johnsie Cancel & Daphane Shepherd RN

## 2014-05-19 NOTE — Telephone Encounter (Signed)
I spoke with pt & she states her weight is 124.5 today. She

## 2014-05-19 NOTE — Telephone Encounter (Signed)
Follow up     Wanting to know what Dr Johnsie Cancel said about gaining wt while on metoprolol.  Please call

## 2014-05-24 ENCOUNTER — Ambulatory Visit (INDEPENDENT_AMBULATORY_CARE_PROVIDER_SITE_OTHER): Payer: 59 | Admitting: Licensed Clinical Social Worker

## 2014-05-24 DIAGNOSIS — F411 Generalized anxiety disorder: Secondary | ICD-10-CM

## 2014-05-24 DIAGNOSIS — F332 Major depressive disorder, recurrent severe without psychotic features: Secondary | ICD-10-CM

## 2014-05-26 ENCOUNTER — Other Ambulatory Visit: Payer: Self-pay | Admitting: Internal Medicine

## 2014-05-26 MED ORDER — OMEPRAZOLE 20 MG PO CPDR: 20.0000 mg | DELAYED_RELEASE_CAPSULE | Freq: Every day | ORAL | Status: DC

## 2014-05-31 ENCOUNTER — Ambulatory Visit (INDEPENDENT_AMBULATORY_CARE_PROVIDER_SITE_OTHER): Payer: 59 | Admitting: Licensed Clinical Social Worker

## 2014-05-31 DIAGNOSIS — F332 Major depressive disorder, recurrent severe without psychotic features: Secondary | ICD-10-CM

## 2014-06-19 ENCOUNTER — Telehealth: Payer: Self-pay | Admitting: Internal Medicine

## 2014-06-19 MED ORDER — VALACYCLOVIR HCL 1 G PO TABS: 1000.0000 mg | ORAL_TABLET | Freq: Three times a day (TID) | ORAL | Status: DC

## 2014-06-19 NOTE — Telephone Encounter (Signed)
Patient with dermatomal red raised rash in the left lower quadrant and left lower back region near the iliac crest Rashes somewhat pruritic and also painful Consistent with varicella-zoster Will treat with valacyclovir 1000 mg times daily 7 days Patient advised to see primary care to ensure resolution and notify primary care of any worsening pain or rash

## 2014-06-22 ENCOUNTER — Ambulatory Visit: Payer: 59 | Admitting: Licensed Clinical Social Worker

## 2014-06-28 ENCOUNTER — Ambulatory Visit (INDEPENDENT_AMBULATORY_CARE_PROVIDER_SITE_OTHER): Payer: 59 | Admitting: Family Medicine

## 2014-06-28 ENCOUNTER — Encounter: Payer: Self-pay | Admitting: Family Medicine

## 2014-06-28 VITALS — BP 112/80 | HR 55 | Temp 97.3°F | Ht 63.0 in | Wt 123.6 lb

## 2014-06-28 DIAGNOSIS — B029 Zoster without complications: Secondary | ICD-10-CM

## 2014-06-28 NOTE — Progress Notes (Signed)
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  Past Medical History  Diagnosis Date  . IBS (irritable bowel syndrome)     constipation predominant  . Interstitial cystitis   . Lupus   . Hemorrhoids   . Allergy   . Blood transfusion without reported diagnosis     x3  . Heart murmur   . Osteoporosis     osteopenia  . Anxiety     takes Klonopin nighlty  . GERD (gastroesophageal reflux disease)     takes Omeprazole daily  . Nausea     takes Zofran prn  . Insomnia     takes Ambien prn  . Depression     takes Effexor daily  . PONV (postoperative nausea and vomiting)     laryngospasm-1999  . History of bronchitis     winter of 2014  . Pneumonia     hx of;last time 38yrs ago  . History of migraine     last one a yr ago and takes Zomig prn  . Seasonal allergies     Flonase daily   . Numbness     both legs and related to back  . Arthritis     uses Diclofenac gel;Rheumatoid  . Chronic back pain   . Joint pain   . Joint swelling   . Eczema   . Chronic constipation     Miralax once weekly  . History of kidney stones     passed on her own  . History of bladder infections   . Anemia     after birth of child 67yrs ago  . Hx of echocardiogram     Echo (8/15):  EF 55-60%, no RWMA    Past Surgical History  Procedure Laterality Date  . Abdominal hysterectomy   2007  . Tonsillectomy    . Excision morton's neuroma Right   . Colonoscopy  02/11/2013 and 12/24/04    internal and external hemorrhoids  . Flexible sigmoidoscopy  03/07/2008    internal and external hemorrhoids  . Upper gastrointestinal endoscopy  10/23/2010    gastritis, irregular Z-line  . Wisdom teeth extracted    . Uterine ablation      Family History  Problem Relation Age of Onset  . Coronary artery disease Father   . Diabetes      uncle  . Diverticulosis Mother   . Hypertension Mother   . Osteoarthritis Mother   . Colon cancer Neg Hx   . Heart attack Father   . Hypertension Father   . Hypertension Maternal Grandmother   . Hypertension Maternal Grandfather   . Stroke Maternal Grandmother   . Stroke Paternal Grandfather   . Cancer Maternal Grandfather   . Depression Father   . Depression Mother   . Depression Paternal Grandmother   . Depression Sister   . Hyperlipidemia Father   .  Hyperlipidemia    . Hyperlipidemia Paternal Grandfather     History   Social History  . Marital Status: Married    Spouse Name: N/A    Number of Children: N/A  . Years of Education: N/A   Occupational History  . RN [employer]   Social History Main Topics  . Smoking status: Never Smoker   . Smokeless tobacco: Never Used  . Alcohol Use: 0.5 oz/week    1 drink(s) per week     Comment: socially  . Drug Use: No  . Sexual Activity: Yes   Other Topics Concern  . None   Social History Narrative    Current outpatient prescriptions: calcium-vitamin D (OSCAL WITH D) 500-200 MG-UNIT per tablet, Take 2 tablets by mouth every evening., Disp: , Rfl: ;  cholecalciferol (VITAMIN D) 1000 UNITS tablet, Take 1,000 Units by mouth every evening., Disp: , Rfl: ;  diazepam (VALIUM) 5 MG tablet, Take 1-2 tablets (5-10 mg total) by mouth every 6 (six) hours as needed (Spasms)., Disp: 60 tablet, Rfl: 0 estradiol (ESTRACE) 1 MG tablet, Take 1 mg by mouth every other day., Disp: , Rfl: ;  FLUoxetine  (PROZAC) 20 MG capsule, Take 20 mg by mouth daily., Disp: , Rfl: ;  fluticasone (FLONASE) 50 MCG/ACT nasal spray, Place 2 sprays into the nose daily., Disp: 16 g, Rfl: 5;  gabapentin (NEURONTIN) 300 MG capsule, Take 300 mg by mouth 2 (two) times daily., Disp: , Rfl:  ibuprofen (ADVIL,MOTRIN) 200 MG tablet, Take 400 mg by mouth every 6 (six) hours as needed. For pain, Disp: , Rfl: ;  metoprolol tartrate (LOPRESSOR) 25 MG tablet, Take 1 tablet (25 mg total) by mouth 2 (two) times daily., Disp: 60 tablet, Rfl: 11;  Multiple Vitamin (MULTIVITAMIN WITH MINERALS) TABS, Take 1 tablet by mouth every evening., Disp: , Rfl:  omeprazole (PRILOSEC) 20 MG capsule, Take 1 capsule (20 mg total) by mouth daily., Disp: 90 capsule, Rfl: 3;  ondansetron (ZOFRAN-ODT) 4 MG disintegrating tablet, Take 4 mg by mouth every 8 (eight) hours as needed for nausea., Disp: , Rfl: ;  oxyCODONE-acetaminophen (PERCOCET) 5-325 MG per tablet, Take by mouth as needed for severe pain., Disp: , Rfl:  sucralfate (CARAFATE) 1 GM/10ML suspension, Take 10 mLs (1 g total) by mouth 3 (three) times daily as needed. For digestion, Disp: 840 mL, Rfl: 5;  valACYclovir (VALTREX) 1000 MG tablet, Take 1 tablet (1,000 mg total) by mouth 3 (three) times daily., Disp: 21 tablet, Rfl: 0;  ZOMIG 5 MG tablet, TAKE 1 TABLET BY MOUTH AT THE TIME OF HEADACHE, MAY REPEAT IN ONE HOUR IF HEADACHE PERSISTS, Disp: 9 tablet, Rfl: 0  EXAM:  Filed Vitals:   06/28/14 1102  BP: 112/80  Pulse: 55  Temp: 97.3 F (36.3 C)    Body mass index is 21.9 kg/(m^2).  GENERAL: vitals reviewed and listed above, alert, oriented, appears well hydrated and in no acute distress  HEENT: atraumatic, conjunttiva clear, no obvious abnormalities on inspection of external nose and ears  NECK: no obvious masses on inspection  LUNGS: clear to auscultation bilaterally, no wheezes, rales or rhonchi, good air movement  CV: HRRR, no peripheral edema  SKIN: erythematous papulovesicular  rash with some scab formation/mild crusting and now only a few small vesicles along dermatome on lower abd and back  MS: moves all extremities without noticeable abnormality - no TTP along ribs or in L axillary region and no LAD on masses  PSYCH: pleasant and cooperative, no obvious  depression or anxiety  ASSESSMENT AND PLAN:  Discussed the following assessment and plan:  Shingles  -advised to complete valtrex, offered pain medicaiton but she reports pain is much improved and does not feel she needs this -discussed course, complications, return precautions -she is planning to establish with Dr. Yong Channel - advised to set up new patient visit -Patient advised to return or notify a doctor immediately if symptoms worsen or persist or new concerns arise.  Patient Instructions  Shingles Shingles (herpes zoster) is an infection that is caused by the same virus that causes chickenpox (varicella). The infection causes a painful skin rash and fluid-filled blisters, which eventually break open, crust over, and heal. It may occur in any area of the body, but it usually affects only one side of the body or face. The pain of shingles usually lasts about 1 month. However, some people with shingles may develop long-term (chronic) pain in the affected area of the body. Shingles often occurs many years after the person had chickenpox. It is more common:  In people older than 50 years.  In people with weakened immune systems, such as those with HIV, AIDS, or cancer.  In people taking medicines that weaken the immune system, such as transplant medicines.  In people under great stress. CAUSES  Shingles is caused by the varicella zoster virus (VZV), which also causes chickenpox. After a person is infected with the virus, it can remain in the person's body for years in an inactive state (dormant). To cause shingles, the virus reactivates and breaks out as an infection in a nerve root. The virus can be spread  from person to person (contagious) through contact with open blisters of the shingles rash. It will only spread to people who have not had chickenpox. When these people are exposed to the virus, they may develop chickenpox. They will not develop shingles. Once the blisters scab over, the person is no longer contagious and cannot spread the virus to others. SIGNS AND SYMPTOMS  Shingles shows up in stages. The initial symptoms may be pain, itching, and tingling in an area of the skin. This pain is usually described as burning, stabbing, or throbbing.In a few days or weeks, a painful red rash will appear in the area where the pain, itching, and tingling were felt. The rash is usually on one side of the body in a band or belt-like pattern. Then, the rash usually turns into fluid-filled blisters. They will scab over and dry up in approximately 2-3 weeks. Flu-like symptoms may also occur with the initial symptoms, the rash, or the blisters. These may include:  Fever.  Chills.  Headache.  Upset stomach. DIAGNOSIS  Your health care provider will perform a skin exam to diagnose shingles. Skin scrapings or fluid samples may also be taken from the blisters. This sample will be examined under a microscope or sent to a lab for further testing. TREATMENT  There is no specific cure for shingles. Your health care provider will likely prescribe medicines to help you manage the pain, recover faster, and avoid long-term problems. This may include antiviral drugs, anti-inflammatory drugs, and pain medicines. HOME CARE INSTRUCTIONS   Take a cool bath or apply cool compresses to the area of the rash or blisters as directed. This may help with the pain and itching.   Take medicines only as directed by your health care provider.   Rest as directed by your health care provider.  Keep your rash and blisters clean with mild soap  and cool water or as directed by your health care provider.  Do not pick your  blisters or scratch your rash. Apply an anti-itch cream or numbing creams to the affected area as directed by your health care provider.  Keep your shingles rash covered with a loose bandage (dressing).  Avoid skin contact with:  Babies.   Pregnant women.   Children with eczema.   Elderly people with transplants.   People with chronic illnesses, such as leukemia or AIDS.   Wear loose-fitting clothing to help ease the pain of material rubbing against the rash.  Keep all follow-up visits as directed by your health care provider.If the area involved is on your face, you may receive a referral for a specialist, such as an eye doctor (ophthalmologist) or an ear, nose, and throat (ENT) doctor. Keeping all follow-up visits will help you avoid eye problems, chronic pain, or disability.  SEEK IMMEDIATE MEDICAL CARE IF:   You have facial pain, pain around the eye area, or loss of feeling on one side of your face.  You have ear pain or ringing in your ear.  You have loss of taste.  Your pain is not relieved with prescribed medicines.   Your redness or swelling spreads.   You have more pain and swelling.  Your condition is worsening or has changed.   You have a fever. MAKE SURE YOU:  Understand these instructions.  Will watch your condition.  Will get help right away if you are not doing well or get worse. Document Released: 06/30/2005 Document Revised: 11/14/2013 Document Reviewed: 02/12/2012 [facility] Patient Information 2015 [place], [place]. This information is not intended to replace advice given to you by your health care provider. Make sure you discuss any questions you have with your health care provider.      Colin Benton R.

## 2014-06-28 NOTE — Patient Instructions (Signed)

## 2014-06-28 NOTE — Progress Notes (Signed)
Pre visit review using our clinic review tool, if applicable. No additional management support is needed unless otherwise documented below in the visit note. 

## 2014-09-06 ENCOUNTER — Ambulatory Visit (INDEPENDENT_AMBULATORY_CARE_PROVIDER_SITE_OTHER): Payer: 59 | Admitting: Family Medicine

## 2014-09-06 ENCOUNTER — Encounter: Payer: Self-pay | Admitting: Family Medicine

## 2014-09-06 VITALS — BP 110/80 | HR 63 | Temp 97.8°F | Ht 62.75 in | Wt 121.2 lb

## 2014-09-06 DIAGNOSIS — Z7189 Other specified counseling: Secondary | ICD-10-CM

## 2014-09-06 DIAGNOSIS — I10 Essential (primary) hypertension: Secondary | ICD-10-CM

## 2014-09-06 DIAGNOSIS — M48062 Spinal stenosis, lumbar region with neurogenic claudication: Secondary | ICD-10-CM

## 2014-09-06 DIAGNOSIS — F32A Depression, unspecified: Secondary | ICD-10-CM

## 2014-09-06 DIAGNOSIS — Z7689 Persons encountering health services in other specified circumstances: Secondary | ICD-10-CM

## 2014-09-06 DIAGNOSIS — R002 Palpitations: Secondary | ICD-10-CM

## 2014-09-06 DIAGNOSIS — F411 Generalized anxiety disorder: Secondary | ICD-10-CM

## 2014-09-06 DIAGNOSIS — M4806 Spinal stenosis, lumbar region: Secondary | ICD-10-CM

## 2014-09-06 DIAGNOSIS — F329 Major depressive disorder, single episode, unspecified: Secondary | ICD-10-CM

## 2014-09-06 NOTE — Progress Notes (Signed)
HPI:  [name] is here to establish care. She reports she is doing well. Last PCP and physical: Sees Dr. Nori Riis in gyn. Hx of hysterectomy for fibroid and endometriosis.  Has the following chronic problems that require follow up and concerns today:  HTN/Palpitations: -has seen cardiologist for evaluation -on metoprolol and doing well -Denies: CP, sob, doe, swelling  IBS/GERD: -take PPI or carafate intermittently -doing well -sees Dr. Arelia Longest for this -denies: blood in stool, weight loss  Depression and Anxiety: -sees a psychiatrist for this -meds include: prozac and neurontin for this -stable and prefers to see me for this unless worsens -reports doing great, no SI  Hx chronic back pain: -managed by Dr. Trenton Gammon -doing much better since surgery in 2014 and cleared sking -uses valium rarely for spasm  Hx migraines: -very rare now -zomig in the past  Hot Flashes: -on HRT, whenever has tried to stop has bad hot flashes  ROS negative for unless reported above: fevers, unintentional weight loss, hearing or vision loss, chest pain, palpitations, struggling to breath, hemoptysis, melena, hematochezia, hematuria, falls, loc, si, thoughts of self harm  Past Medical History  Diagnosis Date  . IBS (irritable bowel syndrome)     constipation predominant  . Interstitial cystitis   . Hemorrhoids   . Allergy   . Blood transfusion without reported diagnosis     x3  . Heart murmur   . Osteoporosis     osteopenia  . Anxiety     takes Klonopin nighlty  . GERD (gastroesophageal reflux disease)     takes Omeprazole daily  . Nausea     takes Zofran prn  . Insomnia     takes Ambien prn  . Depression     takes Effexor daily  . PONV (postoperative nausea and vomiting)     laryngospasm-1999  . History of bronchitis     winter of 2014  . Pneumonia     hx of;last time 47yrs ago  . History of migraine     last one a yr ago and takes Zomig prn  . Seasonal allergies    Flonase daily   . Numbness     both legs and related to back  . Arthritis     uses Diclofenac gel;Rheumatoid  . Chronic back pain     ? RA, ? Lupus - reports saw rheumatologist in the past but better now  . Joint pain   . Joint swelling   . Eczema   . Chronic constipation     Miralax once weekly  . History of kidney stones     passed on her own  . History of bladder infections   . Anemia     after birth of child 42yrs ago  . Hx of echocardiogram     Echo (8/15):  EF 55-60%, no RWMA  . Thyroid nodule     on [place] 2015, she opted to not re-image    Past Surgical History  Procedure Laterality Date  . Abdominal hysterectomy  2007  . Tonsillectomy    . Excision morton's neuroma Right   . Colonoscopy  02/11/2013 and 12/24/04    internal and external hemorrhoids  . Flexible sigmoidoscopy  03/07/2008    internal and external hemorrhoids  . Upper gastrointestinal endoscopy  10/23/2010    gastritis, irregular Z-line  . Wisdom teeth extracted    . Uterine ablation      Family History  Problem Relation Age of Onset  . Coronary  artery disease Father   . Diabetes      uncle  . Diverticulosis Mother   . Hypertension Mother   . Osteoarthritis Mother   . Colon cancer Neg Hx   . Heart attack Father   . Hypertension Father   . Hypertension Maternal Grandmother   . Hypertension Maternal Grandfather   . Stroke Maternal Grandmother   . Stroke Paternal Grandfather   . Cancer Maternal Grandfather   . Depression Father   . Depression Mother   . Depression Paternal Grandmother   . Depression Sister   . Hyperlipidemia Father   . Hyperlipidemia    . Hyperlipidemia Paternal Grandfather     History   Social History  . Marital Status: Married    Spouse Name: N/A  . Number of Children: N/A  . Years of Education: N/A   Occupational History  . RN [employer]   Social History Main Topics  . Smoking status: Never Smoker   . Smokeless tobacco: Never Used  . Alcohol Use: 0.5 oz/week     1 drink(s) per week     Comment: socially  . Drug Use: No  . Sexual Activity: Yes   Other Topics Concern  . 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  calcium-vitamin D (OSCAL WITH D) 500-200 MG-UNIT per tablet, Take 2 tablets by mouth every evening., Disp: , Rfl:  .  cholecalciferol (VITAMIN D) 1000 UNITS tablet, Take 1,000 Units by mouth every evening., Disp: , Rfl:  .  diazepam (VALIUM) 5 MG tablet, Take 1-2 tablets (5-10 mg total) by mouth every 6 (six) hours as needed (Spasms)., Disp: 60 tablet, Rfl: 0 .  estradiol (ESTRACE) 1 MG tablet, Take 1 mg by mouth every other day., Disp: , Rfl:  .  FLUoxetine (PROZAC) 20 MG capsule, Take 20 mg by mouth daily., Disp: , Rfl:  .  fluticasone (FLONASE) 50 MCG/ACT nasal spray, Place 2 sprays into the nose daily., Disp: 16 g, Rfl: 5 .  gabapentin (NEURONTIN) 300 MG capsule, Take 300 mg by mouth 2 (two) times daily., Disp: , Rfl:  .  ibuprofen (ADVIL,MOTRIN) 200 MG tablet, Take 400 mg by mouth every 6 (six) hours as needed. For pain, Disp: , Rfl:  .  metoprolol tartrate (LOPRESSOR) 25 MG tablet, Take 1 tablet (25 mg total) by mouth 2 (two) times daily., Disp: 60 tablet, Rfl: 11 .  Multiple Vitamin (MULTIVITAMIN WITH MINERALS) TABS, Take 1 tablet by mouth every evening., Disp: , Rfl:  .  omeprazole (PRILOSEC) 20 MG capsule, Take 1 capsule (20 mg total) by mouth daily., Disp: 90 capsule, Rfl: 3 .  ondansetron (ZOFRAN-ODT) 4 MG disintegrating tablet, Take 4 mg by mouth every 8 (eight) hours as needed for nausea., Disp: , Rfl:  .  sucralfate (CARAFATE) 1 GM/10ML suspension, Take 10 mLs (1 g total) by mouth 3 (three) times daily as needed. For digestion, Disp: 840 mL, Rfl: 5 .  ZOMIG 5 MG tablet, TAKE 1 TABLET BY MOUTH AT THE TIME OF  HEADACHE, MAY REPEAT IN ONE HOUR IF HEADACHE PERSISTS, Disp: 9 tablet, Rfl: 0  EXAM:  Filed Vitals:   09/06/14 0946  BP: 110/80  Pulse:  63  Temp: 97.8 F (36.6 C)    Body mass index is 21.64 kg/(m^2).  GENERAL: vitals reviewed and listed above, alert, oriented, appears well hydrated and in no acute distress  HEENT: atraumatic, conjunttiva clear, no obvious abnormalities on inspection of external nose and ears  NECK: no obvious masses on inspection  LUNGS: clear to auscultation bilaterally, no wheezes, rales or rhonchi, good air movement  CV: HRRR, no peripheral edema  MS: moves all extremities without noticeable abnormality  PSYCH: pleasant and cooperative, no obvious depression or anxiety  ASSESSMENT AND PLAN:  Discussed the following assessment and plan:  Essential hypertension  Spinal stenosis, lumbar region, with neurogenic claudication  Anxiety state  Depression  Heart palpitations  Encounter to establish care  -We reviewed the PMH, PSH, FH, SH, Meds and Allergies. -We provided refills for any medications we will prescribe as needed. -We addressed current concerns per orders and patient instructions. -We have asked for records for pertinent exams, studies, vaccines and notes from previous providers. -We have advised patient to follow up per instructions below. -will refill psych meds, have her see psych if worsening, stable currently -follow up yearly and as needed  -Patient advised to return or notify a doctor immediately if symptoms worsen or persist or new concerns arise.  There are no Patient Instructions on file for this visit.   Colin Benton R.

## 2014-09-06 NOTE — Progress Notes (Signed)
Pre visit review using our clinic review tool, if applicable. No additional management support is needed unless otherwise documented below in the visit note. 

## 2014-09-06 NOTE — Patient Instructions (Signed)
We recommend the following healthy lifestyle measures: - eat a healthy diet consisting of lots of vegetables, fruits, beans, nuts, seeds, healthy meats such as white chicken and fish and whole grains.  - avoid fried foods, fast food, processed foods, sodas, red meet and other fattening foods.  - get a least 150 minutes of aerobic exercise per week.     

## 2014-09-18 ENCOUNTER — Encounter: Payer: Self-pay | Admitting: Internal Medicine

## 2014-09-18 ENCOUNTER — Ambulatory Visit (INDEPENDENT_AMBULATORY_CARE_PROVIDER_SITE_OTHER): Payer: 59 | Admitting: Internal Medicine

## 2014-09-18 VITALS — BP 88/58 | HR 64 | Ht 62.25 in | Wt 121.4 lb

## 2014-09-18 DIAGNOSIS — K648 Other hemorrhoids: Secondary | ICD-10-CM

## 2014-09-18 DIAGNOSIS — R197 Diarrhea, unspecified: Secondary | ICD-10-CM

## 2014-09-18 DIAGNOSIS — K625 Hemorrhage of anus and rectum: Secondary | ICD-10-CM

## 2014-09-18 MED ORDER — HYDROCORTISONE ACETATE 25 MG RE SUPP: RECTAL | Status: DC

## 2014-09-18 NOTE — Progress Notes (Signed)
   Subjective:    Patient ID: [name], female    DOB: 29-Mar-1956, 59 y.o.   MRN: [id number] Cc: Rectal bleeding/blood in stool HPI Camrin was away skiing about 2 weeks ago, she ate some beef which sometimes gives her GI problems, and then had severe left sided abdominal cramps and diarrhea. After multiple loose stools she started having some blood in her stools and in the commode. The pain was fairly intense but she dealt with it. Over time the diarrhea has resolved but she still seeing intermittent rectal bleeding but not with every bowel movement. Not constipated prior to this.  Medications, allergies, past medical history, past surgical history, family history and social history are reviewed and updated in the EMR.  Review of Systems As above    Objective:   Physical Exam BP 88/58 mmHg  Pulse 64  Ht 5' 2.25" (1.581 m)  Wt 121 lb 6 oz (55.055 kg)  BMI 22.03 kg/m2 Well-developed well-nourished no acute distress The abdomen is soft and nontender bowel sounds present  Rectal exam with female staff present we'll some small anal tags, nontender normal resting tone and no mass.  Anoscopy is performed with female chaperone present and demonstrates some  grade 1 internal and external hemorrhoids, the left lateral) posterior somewhat inflamed.     Assessment & Plan:   1. Rectal bleeding   2. Diarrhea   3. Internal hemorrhoids with complication    I'm not sure what the cause of her problems has been, I think it was either an infectious diarrhea with inflamed hemorrhoids versus ischemic colitis. Whenever this is is getting better. I'm going to prescribe some hydrocortisone suppositories for the hemorrhoids. She does not seem to have chronic problems with these I would not recommend banding at this time. She knows to let me know if this does not continue to improve. She has had colonoscopies in the past, as recently in 2014 without any significant problems.

## 2014-09-18 NOTE — Patient Instructions (Signed)
We have sent the following medications to your pharmacy for you to pick up at your convenience: Rectal suppositories   Follow up with [place] as needed.   I appreciate the opportunity to care for you. Silvano Rusk, M.D., [facility]

## 2014-09-27 ENCOUNTER — Telehealth: Payer: Self-pay | Admitting: Internal Medicine

## 2014-09-27 ENCOUNTER — Other Ambulatory Visit: Payer: Self-pay | Admitting: Internal Medicine

## 2014-09-27 MED ORDER — ZOLPIDEM TARTRATE 5 MG PO TABS: 5.0000 mg | ORAL_TABLET | Freq: Every evening | ORAL | Status: AC | PRN

## 2014-09-27 NOTE — Telephone Encounter (Signed)
Travelling and needs sleep aid - refill zolpidem

## 2014-10-04 ENCOUNTER — Ambulatory Visit: Payer: 59 | Admitting: Cardiovascular Disease

## 2014-10-12 ENCOUNTER — Other Ambulatory Visit: Payer: Self-pay

## 2014-10-12 MED ORDER — SUCRALFATE 1 GM/10ML PO SUSP: 1.0000 g | Freq: Three times a day (TID) | ORAL | Status: DC | PRN

## 2014-11-10 ENCOUNTER — Encounter: Payer: Self-pay | Admitting: Family Medicine

## 2014-11-10 ENCOUNTER — Ambulatory Visit (INDEPENDENT_AMBULATORY_CARE_PROVIDER_SITE_OTHER): Payer: 59 | Admitting: Family Medicine

## 2014-11-10 VITALS — BP 100/60 | HR 81 | Temp 98.4°F | Ht 62.25 in | Wt 123.0 lb

## 2014-11-10 DIAGNOSIS — F3341 Major depressive disorder, recurrent, in partial remission: Secondary | ICD-10-CM | POA: Insufficient documentation

## 2014-11-10 DIAGNOSIS — F39 Unspecified mood [affective] disorder: Secondary | ICD-10-CM | POA: Diagnosis not present

## 2014-11-10 DIAGNOSIS — M898X8 Other specified disorders of bone, other site: Secondary | ICD-10-CM | POA: Diagnosis not present

## 2014-11-10 DIAGNOSIS — R232 Flushing: Secondary | ICD-10-CM

## 2014-11-10 DIAGNOSIS — F330 Major depressive disorder, recurrent, mild: Secondary | ICD-10-CM | POA: Insufficient documentation

## 2014-11-10 HISTORY — DX: Flushing: R23.2

## 2014-11-10 NOTE — Progress Notes (Signed)
Pre visit review using our clinic review tool, if applicable. No additional management support is needed unless otherwise documented below in the visit note. 

## 2014-11-10 NOTE — Progress Notes (Signed)
HPI:  Acute visit for L iliac crest pain: -pain in L iliac crest for many months -seeing rheumatologist and has had extensive evaluation there with xray and told they did not know what it was and may be related to her DDD (reports she sees Dr. Trenton Gammon for this and recently saw him and told him about this) -pain is on the iliac crest on the lef tonly -reports this is in the same area where her shingles rash was and she wonders if this is related -pain is achy, constant, 6/10 - only in this one spot - no other symptoms or pain elsewhere  -denies: fevers, chills, radiation, weakness  ROS: See pertinent positives and negatives per HPI.  Past Medical History  Diagnosis Date  . IBS (irritable bowel syndrome)     constipation predominant  . Interstitial cystitis     hx of UTI  . Hemorrhoids   . Allergy   . Blood transfusion without reported diagnosis     x3  . Heart murmur   . Osteoporosis     osteopenia  . GERD (gastroesophageal reflux disease)     takes Omeprazole daily, with nausea  . Anxiety and depression     with insomnia  . PONV (postoperative nausea and vomiting)     laryngospasm-1999  . History of bronchitis     winter of 2014  . Pneumonia     hx of;last time 70yrs ago  . History of migraine     last one a yr ago and takes Zomig prn  . Seasonal allergies     Flonase daily   . Numbness     both legs and related to back  . Arthritis     uses Diclofenac gel;Rheumatoid  . Chronic back pain     ? RA, ? Lupus - reports saw rheumatologist in the past but better now  . Eczema   . History of kidney stones     passed on her own  . Anemia     after birth of child 16yrs ago  . Hx of echocardiogram     Echo (8/15):  EF 55-60%, no RWMA  . Thyroid nodule     on [place] 2015, she opted to not re-image  . HTN (hypertension) 03/31/2012  . Raynaud's disease /phenomenon 10/21/2012  . Hot flashes 11/10/2014    Past Surgical History  Procedure Laterality Date  . Abdominal  hysterectomy  2007  . Tonsillectomy    . Excision morton's neuroma Right   . Colonoscopy  02/11/2013 and 12/24/04    internal and external hemorrhoids  . Flexible sigmoidoscopy  03/07/2008    internal and external hemorrhoids  . Upper gastrointestinal endoscopy  10/23/2010    gastritis, irregular Z-line  . Wisdom teeth extracted    . Uterine ablation      Family History  Problem Relation Age of Onset  . Coronary artery disease Father   . Diabetes Paternal Uncle   . Diverticulosis Mother   . Hypertension Mother   . Osteoarthritis Mother   . Colon cancer Neg Hx   . Heart attack Father   . Hypertension Father   . Hypertension Maternal Grandmother   . Hypertension Maternal Grandfather   . Stroke Maternal Grandmother   . Stroke Paternal Grandfather   . Cancer Maternal Grandfather   . Depression Father   . Depression Mother   . Depression Paternal Grandmother   . Depression Sister   . Hyperlipidemia Father   . Hyperlipidemia Paternal Grandfather  History   Social History  . Marital Status: Married    Spouse Name: N/A  . Number of Children: N/A  . Years of Education: N/A   Occupational History  . RN [employer]   Social History Main Topics  . Smoking status: Never Smoker   . Smokeless tobacco: Never Used  . Alcohol Use: 0.5 oz/week    1 drink(s) per week     Comment: socially  . Drug Use: No  . Sexual Activity: Yes   Other Topics Concern  . 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  calcium-vitamin D (OSCAL WITH D) 500-200 MG-UNIT per tablet, Take 2 tablets by mouth every evening., Disp: , Rfl:  .  cholecalciferol (VITAMIN D) 1000 UNITS tablet, Take 1,000 Units by mouth every evening., Disp: , Rfl:  .  diazepam (VALIUM) 5 MG tablet, Take 1-2  tablets (5-10 mg total) by mouth every 6 (six) hours as needed (Spasms)., Disp: 60 tablet, Rfl: 0 .  estradiol (VIVELLE-DOT) 0.05 MG/24HR patch, Place 1 patch onto the skin 2 (two) times a week., Disp: , Rfl:  .  FLUoxetine (PROZAC) 20 MG capsule, Take 20 mg by mouth daily., Disp: , Rfl:  .  fluticasone (FLONASE) 50 MCG/ACT nasal spray, Place 2 sprays into the nose daily., Disp: 16 g, Rfl: 5 .  gabapentin (NEURONTIN) 300 MG capsule, Take 300 mg by mouth 2 (two) times daily., Disp: , Rfl:  .  hydrocortisone (ANUSOL-HC) 25 MG suppository, One at bedtime for 5 nights then as needed, Disp: 24 suppository, Rfl: 0 .  ibuprofen (ADVIL,MOTRIN) 200 MG tablet, Take 400 mg by mouth every 6 (six) hours as needed. For pain, Disp: , Rfl:  .  metoprolol tartrate (LOPRESSOR) 25 MG tablet, Take 1 tablet (25 mg total) by mouth 2 (two) times daily., Disp: 60 tablet, Rfl: 11 .  Multiple Vitamin (MULTIVITAMIN WITH MINERALS) TABS, Take 1 tablet by mouth every evening., Disp: , Rfl:  .  omeprazole (PRILOSEC) 20 MG capsule, Take 1 capsule (20 mg total) by mouth daily., Disp: 90 capsule, Rfl: 3 .  ondansetron (ZOFRAN-ODT) 4 MG disintegrating tablet, Take 4 mg by mouth every 8 (eight) hours as needed for nausea., Disp: , Rfl:  .  sucralfate (CARAFATE) 1 GM/10ML suspension, Take 10 mLs (1 g total) by mouth 3 (three) times daily as needed. For digestion, Disp: 840 mL, Rfl: 5 .  ZOMIG 5 MG tablet, TAKE 1 TABLET BY MOUTH AT THE TIME OF HEADACHE, MAY REPEAT IN ONE HOUR IF HEADACHE PERSISTS, Disp: 9 tablet, Rfl: 0  EXAM:  Filed Vitals:   11/10/14 1552  BP: 100/60  Pulse: 81  Temp: 98.4 F (36.9 C)    Body mass index is 22.32 kg/(m^2).  GENERAL: vitals reviewed and listed above, alert, oriented, appears well hydrated and in no acute distress  HEENT: atraumatic, conjunttiva clear, no obvious abnormalities on inspection of external nose and ears  NECK: no obvious masses on inspection  LUNGS: clear to auscultation  bilaterally, no wheezes, rales or rhonchi, good air movement  CV: HRRR, no peripheral edema  SKIN: examination of skin in area of complaint is normal  MS: moves all extremities without  noticeable abnormality No abnormalities of visual inspection of the region in question, TTP in small area of the lat posterior iliac crest only  PSYCH: pleasant and cooperative, no obvious depression or anxiety  ASSESSMENT AND PLAN:  Discussed the following assessment and plan:  Iliac crest bone pain Hx of shingles, hx DDD, hx of lumbar spinal stenosis, hx arthralgias -sees rheumatologist and [facility] -discussed options - I am not sure what is causing, possibly radicular from her DDD/spinal stenosis, but feel MRI of this area may be the next step  -I don't think this is post herpetic neuralgia as it is lower then the area where her shingles was, but if MRI neg may consider experimenting with tx for this? -she opted to follow up with Dr. Trenton Gammon first to ensure not related to to her back issues and reports she can get MRI there - may be faster then our ability to schedule oupt MRI -advised to call us if we can assist further and as needed   -Patient advised to return or notify a doctor immediately if symptoms worsen or persist or new concerns arise.  There are no Patient Instructions on file for this visit.   Colin Benton R.

## 2014-12-12 ENCOUNTER — Ambulatory Visit (INDEPENDENT_AMBULATORY_CARE_PROVIDER_SITE_OTHER): Payer: 59 | Admitting: Licensed Clinical Social Worker

## 2014-12-12 DIAGNOSIS — F332 Major depressive disorder, recurrent severe without psychotic features: Secondary | ICD-10-CM | POA: Diagnosis not present

## 2014-12-12 DIAGNOSIS — F411 Generalized anxiety disorder: Secondary | ICD-10-CM | POA: Diagnosis not present

## 2014-12-27 ENCOUNTER — Ambulatory Visit: Payer: 59 | Admitting: Licensed Clinical Social Worker

## 2015-02-08 ENCOUNTER — Other Ambulatory Visit: Payer: Self-pay

## 2015-02-08 DIAGNOSIS — Z1231 Encounter for screening mammogram for malignant neoplasm of breast: Secondary | ICD-10-CM

## 2015-02-09 ENCOUNTER — Telehealth: Payer: Self-pay | Admitting: Family Medicine

## 2015-02-09 NOTE — Telephone Encounter (Signed)
Ok, though if pelvic pain she may want to see her gynecologist?

## 2015-02-09 NOTE — Telephone Encounter (Signed)
Pt will follow up with her gyn

## 2015-02-09 NOTE — Telephone Encounter (Signed)
Pt is off on Wednesday and would like to see dr kim on 02-14-15 for follow up on pelvis pain. Can I use sda slot? If 02-14-15 is no possible can I use last sda slot?

## 2015-02-27 ENCOUNTER — Other Ambulatory Visit (HOSPITAL_COMMUNITY): Payer: Self-pay | Admitting: Orthopedic Surgery

## 2015-02-27 DIAGNOSIS — M898X8 Other specified disorders of bone, other site: Secondary | ICD-10-CM

## 2015-02-28 ENCOUNTER — Ambulatory Visit: Payer: Self-pay | Admitting: Family Medicine

## 2015-03-05 ENCOUNTER — Ambulatory Visit (HOSPITAL_COMMUNITY)
Admission: RE | Admit: 2015-03-05 | Discharge: 2015-03-05 | Disposition: A | Discharge status: Home / Self Care | Payer: 59 | Source: Ambulatory Visit | Attending: Orthopedic Surgery | Admitting: Orthopedic Surgery

## 2015-03-05 DIAGNOSIS — M898X8 Other specified disorders of bone, other site: Secondary | ICD-10-CM

## 2015-03-05 DIAGNOSIS — R102 Pelvic and perineal pain: Secondary | ICD-10-CM | POA: Diagnosis not present

## 2015-03-28 ENCOUNTER — Ambulatory Visit
Admission: RE | Admit: 2015-03-28 | Discharge: 2015-03-28 | Disposition: A | Discharge status: Home / Self Care | Payer: 59 | Source: Ambulatory Visit

## 2015-03-28 DIAGNOSIS — Z1231 Encounter for screening mammogram for malignant neoplasm of breast: Secondary | ICD-10-CM

## 2015-04-02 ENCOUNTER — Ambulatory Visit (INDEPENDENT_AMBULATORY_CARE_PROVIDER_SITE_OTHER): Payer: 59 | Admitting: Family Medicine

## 2015-04-02 ENCOUNTER — Encounter: Payer: Self-pay | Admitting: *Deleted

## 2015-04-02 ENCOUNTER — Encounter: Payer: Self-pay | Admitting: Family Medicine

## 2015-04-02 VITALS — BP 92/62 | HR 90 | Temp 98.1°F | Ht 63.0 in | Wt 124.9 lb

## 2015-04-02 DIAGNOSIS — J069 Acute upper respiratory infection, unspecified: Secondary | ICD-10-CM

## 2015-04-02 NOTE — Progress Notes (Signed)
Pre visit review using our clinic review tool, if applicable. No additional management support is needed unless otherwise documented below in the visit note. 

## 2015-04-02 NOTE — Progress Notes (Signed)
HPI:  URI: -started: 4 days ago -symptoms:nasal congestion, sore throat, cough, sneezing, PND, low grade temp -denies:SOB, NVD, tooth pain. Ear pain, sinus pain -has tried: OTC cough and cold options -sick contacts/travel/risks: denies flu exposure, tick exposure or or Ebola risks -Hx of: allergies ROS: See pertinent positives and negatives per HPI.  Past Medical History  Diagnosis Date  . IBS (irritable bowel syndrome)     constipation predominant  . Interstitial cystitis     hx of UTI  . Hemorrhoids   . Allergy   . Blood transfusion without reported diagnosis     x3  . Heart murmur   . Osteoporosis     osteopenia  . GERD (gastroesophageal reflux disease)     takes Omeprazole daily, with nausea  . Anxiety and depression     with insomnia  . PONV (postoperative nausea and vomiting)     laryngospasm-1999  . History of bronchitis     winter of 2014  . Pneumonia     hx of;last time 76yrs ago  . History of migraine     last one a yr ago and takes Zomig prn  . Seasonal allergies     Flonase daily   . Numbness     both legs and related to back  . Arthritis     uses Diclofenac gel;Rheumatoid  . Chronic back pain     ? RA, ? Lupus - reports saw rheumatologist in the past but better now  . Eczema   . History of kidney stones     passed on her own  . Anemia     after birth of child 1yrs ago  . Hx of echocardiogram     Echo (8/15):  EF 55-60%, no RWMA  . Thyroid nodule     on [place] 2015, she opted to not re-image  . HTN (hypertension) 03/31/2012  . Raynaud's disease /phenomenon 10/21/2012  . Hot flashes 11/10/2014    Past Surgical History  Procedure Laterality Date  . Abdominal hysterectomy  2007  . Tonsillectomy    . Excision morton's neuroma Right   . Colonoscopy  02/11/2013 and 12/24/04    internal and external hemorrhoids  . Flexible sigmoidoscopy  03/07/2008    internal and external hemorrhoids  . Upper gastrointestinal endoscopy  10/23/2010    gastritis,  irregular Z-line  . Wisdom teeth extracted    . Uterine ablation      Family History  Problem Relation Age of Onset  . Coronary artery disease Father   . Diabetes Paternal Uncle   . Diverticulosis Mother   . Hypertension Mother   . Osteoarthritis Mother   . Colon cancer Neg Hx   . Heart attack Father   . Hypertension Father   . Hypertension Maternal Grandmother   . Hypertension Maternal Grandfather   . Stroke Maternal Grandmother   . Stroke Paternal Grandfather   . Cancer Maternal Grandfather   . Depression Father   . Depression Mother   . Depression Paternal Grandmother   . Depression Sister   . Hyperlipidemia Father   . Hyperlipidemia Paternal Grandfather     Social History   Social History  . Marital Status: Married    Spouse Name: N/A  . Number of Children: N/A  . Years of Education: N/A   Occupational History  . RN [employer]   Social History Main Topics  . Smoking status: Never Smoker   . Smokeless tobacco: Never Used  . Alcohol Use: 0.5  oz/week    1 drink(s) per week     Comment: socially  . Drug Use: No  . Sexual Activity: Yes   Other Topics Concern  . 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  calcium-vitamin D (OSCAL WITH D) 500-200 MG-UNIT per tablet, Take 2 tablets by mouth every evening., Disp: , Rfl:  .  cholecalciferol (VITAMIN D) 1000 UNITS tablet, Take 1,000 Units by mouth every evening., Disp: , Rfl:  .  estradiol (VIVELLE-DOT) 0.05 MG/24HR patch, Place 1 patch onto the skin 2 (two) times a week., Disp: , Rfl:  .  FLUoxetine (PROZAC) 20 MG capsule, Take 20 mg by mouth daily., Disp: , Rfl:  .  fluticasone (FLONASE) 50 MCG/ACT nasal spray, Place 2 sprays into the nose daily., Disp: 16 g, Rfl: 5 .  hydrocortisone  (ANUSOL-HC) 25 MG suppository, One at bedtime for 5 nights then as needed, Disp: 24 suppository, Rfl: 0 .  ibuprofen (ADVIL,MOTRIN) 200 MG tablet, Take 400 mg by mouth every 6 (six) hours as needed. For pain, Disp: , Rfl:  .  Multiple Vitamin (MULTIVITAMIN WITH MINERALS) TABS, Take 1 tablet by mouth every evening., Disp: , Rfl:  .  omeprazole (PRILOSEC) 20 MG capsule, Take 1 capsule (20 mg total) by mouth daily., Disp: 90 capsule, Rfl: 3 .  ondansetron (ZOFRAN-ODT) 4 MG disintegrating tablet, Take 4 mg by mouth every 8 (eight) hours as needed for nausea., Disp: , Rfl:  .  sucralfate (CARAFATE) 1 GM/10ML suspension, Take 10 mLs (1 g total) by mouth 3 (three) times daily as needed. For digestion, Disp: 840 mL, Rfl: 5 .  ZOMIG 5 MG tablet, TAKE 1 TABLET BY MOUTH AT THE TIME OF HEADACHE, MAY REPEAT IN ONE HOUR IF HEADACHE PERSISTS, Disp: 9 tablet, Rfl: 0  EXAM:  Filed Vitals:   04/02/15 1602  BP: 92/62  Pulse: 90  Temp: 98.1 F (36.7 C)    Body mass index is 22.13 kg/(m^2).  GENERAL: vitals reviewed and listed above, alert, oriented, appears well hydrated and in no acute distress  HEENT: atraumatic, conjunttiva clear, no obvious abnormalities on inspection of external nose and ears, normal appearance of ear canals and TMs, clear nasal congestion, mild post oropharyngeal erythema with PND, no tonsillar edema or exudate, no sinus TTP  NECK: no obvious masses on inspection  LUNGS: clear to auscultation bilaterally, no wheezes, rales or rhonchi, good air movement  CV: HRRR, no peripheral edema  MS: moves all extremities without noticeable abnormality  PSYCH: pleasant and cooperative, no obvious depression or anxiety  ASSESSMENT AND PLAN:  Discussed the following assessment and plan:  Acute upper respiratory infection  -given HPI and exam findings today, a serious infection or illness is unlikely. We discussed potential etiologies, with VURI being most likely, and advised supportive  care and monitoring. We discussed treatment side effects, likely course, antibiotic misuse, transmission, and signs of developing a serious illness. -of course, we advised to return or notify a doctor immediately if symptoms worsen or persist or new concerns arise.    Patient Instructions  INSTRUCTIONS FOR UPPER RESPIRATORY INFECTION:  -plenty of rest and fluids  -nasal saline wash 2-3 times daily (use prepackaged  nasal saline or bottled/distilled water if making your own)   -can use AFRIN nasal spray for drainage and nasal congestion - but do NOT use longer then 3-4 days  -can use tylenol (in no history of liver disease) or ibuprofen (if no history of kidney disease, bowel bleeding or significant heart disease) as directed for aches and sorethroat  -in the winter time, using a humidifier at night is helpful (please follow cleaning instructions)  -if you are taking a cough medication - use only as directed, may also try a teaspoon of honey to coat the throat and throat lozenges.   -for sore throat, salt water gargles can help  -follow up if you have fevers, facial pain, tooth pain, difficulty breathing or are worsening or symptoms persist longer then expected  Upper Respiratory Infection, Adult An upper respiratory infection (URI) is also known as the common cold. It is often caused by a type of germ (virus). Colds are easily spread (contagious). You can pass it to others by kissing, coughing, sneezing, or drinking out of the same glass. Usually, you get better in 1 to 3  weeks.  However, the cough can last for even longer. HOME CARE   Only take medicine as told by your doctor. Follow instructions provided above.  Drink enough water and fluids to keep your pee (urine) clear or pale yellow.  Get plenty of rest.  Return to work when your temperature is < 100 for 24 hours or as told by your doctor. You may use a face mask and wash your hands to stop your cold from spreading. GET HELP  RIGHT AWAY IF:   After the first 7-10 days, you feel you are getting worse.  You have questions about your medicine.  You have chills, shortness of breath, or red spit (mucus).  You have pain in the face for more then 1-2 days, especially when you bend forward.  You have a fever, puffy (swollen) neck, pain when you swallow, or white spots in the back of your throat.  You have a bad headache, ear pain, sinus pain, or chest pain.  You have a high-pitched whistling sound when you breathe in and out (wheezing).  You cough up blood.  You have sore muscles or a stiff neck. MAKE SURE YOU:   Understand these instructions.  Will watch your condition.  Will get help right away if you are not doing well or get worse. Document Released: 12/17/2007 Document Revised: 09/22/2011 Document Reviewed: 10/05/2013 [facility] Patient Information 2015 [place], [place]. This information is not intended to replace advice given to you by your health care provider. Make sure you discuss any questions you have with your health care provider.      Colin Benton R.

## 2015-04-02 NOTE — Patient Instructions (Signed)
INSTRUCTIONS FOR UPPER RESPIRATORY INFECTION:  -plenty of rest and fluids  -nasal saline wash 2-3 times daily (use prepackaged nasal saline or bottled/distilled water if making your own)   -can use AFRIN nasal spray for drainage and nasal congestion - but do NOT use longer then 3-4 days  -can use tylenol (in no history of liver disease) or ibuprofen (if no history of kidney disease, bowel bleeding or significant heart disease) as directed for aches and sorethroat  -in the winter time, using a humidifier at night is helpful (please follow cleaning instructions)  -if you are taking a cough medication - use only as directed, may also try a teaspoon of honey to coat the throat and throat lozenges.   -for sore throat, salt water gargles can help  -follow up if you have fevers, facial pain, tooth pain, difficulty breathing or are worsening or symptoms persist longer then expected  Upper Respiratory Infection, Adult An upper respiratory infection (URI) is also known as the common cold. It is often caused by a type of germ (virus). Colds are easily spread (contagious). You can pass it to others by kissing, coughing, sneezing, or drinking out of the same glass. Usually, you get better in 1 to 3  weeks.  However, the cough can last for even longer. HOME CARE   Only take medicine as told by your doctor. Follow instructions provided above.  Drink enough water and fluids to keep your pee (urine) clear or pale yellow.  Get plenty of rest.  Return to work when your temperature is < 100 for 24 hours or as told by your doctor. You may use a face mask and wash your hands to stop your cold from spreading. GET HELP RIGHT AWAY IF:   After the first 7-10 days, you feel you are getting worse.  You have questions about your medicine.  You have chills, shortness of breath, or red spit (mucus).  You have pain in the face for more then 1-2 days, especially when you bend forward.  You have a fever, puffy  (swollen) neck, pain when you swallow, or white spots in the back of your throat.  You have a bad headache, ear pain, sinus pain, or chest pain.  You have a high-pitched whistling sound when you breathe in and out (wheezing).  You cough up blood.  You have sore muscles or a stiff neck. MAKE SURE YOU:   Understand these instructions.  Will watch your condition.  Will get help right away if you are not doing well or get worse. Document Released: 12/17/2007 Document Revised: 09/22/2011 Document Reviewed: 10/05/2013 [facility] Patient Information 2015 [place], [place]. This information is not intended to replace advice given to you by your health care provider. Make sure you discuss any questions you have with your health care provider.

## 2015-04-09 ENCOUNTER — Telehealth: Payer: Self-pay | Admitting: Family Medicine

## 2015-04-09 MED ORDER — HYDROCODONE-HOMATROPINE 5-1.5 MG/5ML PO SYRP: 5.0000 mL | ORAL_SOLUTION | Freq: Every evening | ORAL | Status: DC | PRN

## 2015-04-09 NOTE — Telephone Encounter (Signed)
I called the pt and left a detailed message at her cell number with the information below and Constance Holster informed her the Rx was left at the front desk to pick up.

## 2015-04-09 NOTE — Telephone Encounter (Signed)
I called the pt and she stated Hycodan would help her.

## 2015-04-09 NOTE — Telephone Encounter (Signed)
Please remind her of potential for allergic reaction as is morphine related medication.

## 2015-04-09 NOTE — Telephone Encounter (Signed)
Pt call to ask if she can have some cough syrup. She said her cough keeps her up at night

## 2015-04-09 NOTE — Telephone Encounter (Signed)
She has an allergy to morphine listed, but it looks like she has used hycodan in the past? Would this work for her?

## 2015-04-24 ENCOUNTER — Encounter: Payer: Self-pay | Admitting: Gastroenterology

## 2015-04-24 ENCOUNTER — Encounter: Payer: Self-pay | Admitting: Internal Medicine

## 2015-05-17 ENCOUNTER — Other Ambulatory Visit: Payer: Self-pay | Admitting: Family Medicine

## 2015-05-17 NOTE — Telephone Encounter (Signed)
Asking for Ambien refill Has some chronic intermittent insomnia and I have rxed this in past Rare use  #30 no RF

## 2015-06-18 ENCOUNTER — Ambulatory Visit (INDEPENDENT_AMBULATORY_CARE_PROVIDER_SITE_OTHER): Payer: 59 | Admitting: Internal Medicine

## 2015-06-18 ENCOUNTER — Encounter: Payer: Self-pay | Admitting: Internal Medicine

## 2015-06-18 VITALS — BP 94/68 | HR 72 | Ht 62.25 in | Wt 125.1 lb

## 2015-06-18 DIAGNOSIS — Z658 Other specified problems related to psychosocial circumstances: Secondary | ICD-10-CM

## 2015-06-18 DIAGNOSIS — F439 Reaction to severe stress, unspecified: Secondary | ICD-10-CM

## 2015-06-18 DIAGNOSIS — K30 Functional dyspepsia: Secondary | ICD-10-CM

## 2015-06-18 MED ORDER — BUSPIRONE HCL 10 MG PO TABS: 10.0000 mg | ORAL_TABLET | Freq: Two times a day (BID) | ORAL | Status: DC

## 2015-06-18 NOTE — Patient Instructions (Addendum)
   Today we are giving you a printed rx for Buspar.  Check with Dr Theadore Nan before filling this rx.    Today we are giving you a coupon and information for FDgard to try.     I appreciate the opportunity to care for you. Silvano Rusk, MD, [facility]

## 2015-06-18 NOTE — Progress Notes (Signed)
   Subjective:    Patient ID: [name], female    DOB: April 13, 1956, 59 y.o.   MRN: [id number] Chief complaint: Epigastric pain HPI [name] is here with several weeks of gnawing epigastric pain that is really bothersome. She has tried PPI and Carafate and has not made a difference. It is in the setting of worsening anxiety associated with the the anniversary of her first husband's suicide. She goes through this for a few months each year with the anxiety increase etc. She is recently started on the Meckel by her psychiatrist Dr. Theadore Nan. Also occasional Xanax though she doesn't usually take that much. Eating doesn't seem to help the pain. There is no clear-cut early satiety.  Wt Readings from Last 3 Encounters:  06/18/15 125 lb 2 oz (56.756 kg)  04/02/15 124 lb 14.4 oz (56.654 kg)  03/05/15 121 lb (54.885 kg)   Medications, allergies, past medical history, past surgical history, family history and social history are reviewed and updated in the EMR.  Review of Systems As above    Objective:   Physical Exam BP 94/68 mmHg  Pulse 72  Ht 5' 2.25" (1.581 m)  Wt 125 lb 2 oz (56.756 kg)  BMI 22.71 kg/m2 Abdomen is soft nontender no organomegaly or mass and bowel sounds present She has an appropriate mood and affect and good insight    Assessment & Plan:   1. Functional dyspepsia   2. Situational stress    [name] has a history of functional GI problems and I think this is another recurrence of those. I think buspirone could make a difference I'll ask her to check with Dr. Theadore Nan to see if he thinks it's reasonable to add and she will. She will also try FD Donald Prose see if that makes a difference on an as-needed basis and she will update me. She was reassured as well but is anything serious going on here but we will follow along and investigated as needed though she's had imaging and endoscopic evaluations in the past that have been unrevealing.

## 2015-07-17 MED FILL — MINIVELLE 0.075 MG PATCH: 0.075 | 28 days supply | Qty: 8 | Fill #0

## 2015-08-01 DIAGNOSIS — F39 Unspecified mood [affective] disorder: Secondary | ICD-10-CM | POA: Diagnosis not present

## 2015-08-01 DIAGNOSIS — F331 Major depressive disorder, recurrent, moderate: Secondary | ICD-10-CM | POA: Diagnosis not present

## 2015-08-02 MED FILL — ALPRAZolam 1 MG TABS: 1 | 30 days supply | Qty: 30 | Fill #0

## 2015-08-09 MED FILL — FLUoxetine HCL 20 MG CAPS: 20 | 30 days supply | Qty: 30 | Fill #4

## 2015-08-22 DIAGNOSIS — M8580 Other specified disorders of bone density and structure, unspecified site: Secondary | ICD-10-CM | POA: Diagnosis not present

## 2015-08-22 DIAGNOSIS — M8588 Other specified disorders of bone density and structure, other site: Secondary | ICD-10-CM | POA: Diagnosis not present

## 2015-08-22 DIAGNOSIS — Z13820 Encounter for screening for osteoporosis: Secondary | ICD-10-CM | POA: Diagnosis not present

## 2015-08-22 DIAGNOSIS — N951 Menopausal and female climacteric states: Secondary | ICD-10-CM | POA: Diagnosis not present

## 2015-08-22 DIAGNOSIS — M818 Other osteoporosis without current pathological fracture: Secondary | ICD-10-CM | POA: Diagnosis not present

## 2015-08-22 DIAGNOSIS — N958 Other specified menopausal and perimenopausal disorders: Secondary | ICD-10-CM | POA: Diagnosis not present

## 2015-08-22 MED FILL — MINIVELLE 0.075 MG PATCH: 0.075 | 28 days supply | Qty: 8 | Fill #1

## 2015-08-29 DIAGNOSIS — F39 Unspecified mood [affective] disorder: Secondary | ICD-10-CM | POA: Diagnosis not present

## 2015-08-29 DIAGNOSIS — F331 Major depressive disorder, recurrent, moderate: Secondary | ICD-10-CM | POA: Diagnosis not present

## 2015-09-18 DIAGNOSIS — H01029 Squamous blepharitis unspecified eye, unspecified eyelid: Secondary | ICD-10-CM | POA: Diagnosis not present

## 2015-09-18 DIAGNOSIS — L719 Rosacea, unspecified: Secondary | ICD-10-CM | POA: Diagnosis not present

## 2015-09-18 DIAGNOSIS — L718 Other rosacea: Secondary | ICD-10-CM | POA: Diagnosis not present

## 2015-09-18 DIAGNOSIS — H11441 Conjunctival cysts, right eye: Secondary | ICD-10-CM | POA: Diagnosis not present

## 2015-09-20 MED FILL — FLUoxetine HCL 20 MG CAPS: 20 | 30 days supply | Qty: 30 | Fill #5

## 2015-09-20 MED FILL — ALPRAZolam 1 MG TABS: 1 | 30 days supply | Qty: 30 | Fill #1

## 2015-09-20 MED FILL — MINIVELLE 0.075 MG PATCH: 0.075 | 28 days supply | Qty: 8 | Fill #2

## 2015-09-25 DIAGNOSIS — H11123 Conjunctival concretions, bilateral: Secondary | ICD-10-CM | POA: Diagnosis not present

## 2015-10-05 MED FILL — lamoTRIgine 25 MG TABS: 25 | 30 days supply | Qty: 60 | Fill #0

## 2015-10-10 DIAGNOSIS — J3089 Other allergic rhinitis: Secondary | ICD-10-CM | POA: Diagnosis not present

## 2015-10-10 DIAGNOSIS — T781XXA Other adverse food reactions, not elsewhere classified, initial encounter: Secondary | ICD-10-CM | POA: Diagnosis not present

## 2015-10-10 DIAGNOSIS — J3081 Allergic rhinitis due to animal (cat) (dog) hair and dander: Secondary | ICD-10-CM | POA: Diagnosis not present

## 2015-10-10 DIAGNOSIS — H11123 Conjunctival concretions, bilateral: Secondary | ICD-10-CM | POA: Diagnosis not present

## 2015-10-15 ENCOUNTER — Ambulatory Visit (INDEPENDENT_AMBULATORY_CARE_PROVIDER_SITE_OTHER): Payer: 59 | Admitting: Family Medicine

## 2015-10-15 ENCOUNTER — Encounter: Payer: Self-pay | Admitting: Family Medicine

## 2015-10-15 VITALS — BP 104/58 | HR 73 | Temp 98.3°F | Ht 62.25 in | Wt 121.4 lb

## 2015-10-15 DIAGNOSIS — I10 Essential (primary) hypertension: Secondary | ICD-10-CM

## 2015-10-15 DIAGNOSIS — Z0000 Encounter for general adult medical examination without abnormal findings: Secondary | ICD-10-CM | POA: Diagnosis not present

## 2015-10-15 DIAGNOSIS — K219 Gastro-esophageal reflux disease without esophagitis: Secondary | ICD-10-CM

## 2015-10-15 DIAGNOSIS — K581 Irritable bowel syndrome with constipation: Secondary | ICD-10-CM | POA: Diagnosis not present

## 2015-10-15 DIAGNOSIS — F39 Unspecified mood [affective] disorder: Secondary | ICD-10-CM

## 2015-10-15 DIAGNOSIS — H547 Unspecified visual loss: Secondary | ICD-10-CM

## 2015-10-15 HISTORY — DX: Unspecified visual loss: H54.7

## 2015-10-15 LAB — LIPID PANEL
Cholesterol: 191 mg/dL (ref 0–200)
HDL: 64.8 mg/dL (ref >39.00)
LDL Cholesterol: 107 mg/dL — ABNORMAL HIGH (ref 0–99)
NonHDL: 125.92
Total CHOL/HDL Ratio: 3
Triglycerides: 93 mg/dL (ref 0.0–149.0)
VLDL: 18.6 mg/dL (ref 0.0–40.0)

## 2015-10-15 LAB — HEMOGLOBIN A1C: Hgb A1c MFr Bld: 6 % (ref 4.6–6.5)

## 2015-10-15 LAB — BASIC METABOLIC PANEL
BUN: 16 mg/dL (ref 6–23)
CO2: 29 mEq/L (ref 19–32)
Calcium: 9.4 mg/dL (ref 8.4–10.5)
Chloride: 104 mEq/L (ref 96–112)
Creatinine, Ser: 0.8 mg/dL (ref 0.40–1.20)
GFR: 77.98 mL/min (ref >60.00)
Glucose, Bld: 82 mg/dL (ref 70–99)
Potassium: 4.7 mEq/L (ref 3.5–5.1)
Sodium: 139 mEq/L (ref 135–145)

## 2015-10-15 LAB — TSH: TSH: 2.25 u[IU]/mL (ref 0.35–4.50)

## 2015-10-15 NOTE — Progress Notes (Signed)
Pre visit review using our clinic review tool, if applicable. No additional management support is needed unless otherwise documented below in the visit note. 

## 2015-10-15 NOTE — Progress Notes (Addendum)
HPI:  Here for CPE:  -Concerns and/or follow up today:   HTN/Palpitations: -has seen cardiologist for evaluation -on metoprolol in the past for palpitations, made BP too low -Denies: CP, sob, doe, swelling  IBS/GERD: -takes PPI or carafate intermittently, Reports Buspar started for bowels  -sees Dr. Arelia Longest for this -denies: blood in stool, weight loss  Depression and Anxiety: -used to see a psychiatrist for this - seeing Dr. Lita Mains -meds include: prozac and neurontin for this, xanax (for sleep a few nights per week), ambien (rarely) -stable  -husband committed suicide -srtuggles with sleep  Hx chronic back pain: -managed by Dr. Trenton Gammon -doing much better since surgery in 2014  -uses valium rarely for spasm  Hx migraines: -very rare now -zomig in the past  Hot Flashes: -on HRT, whenever has tried to stop has bad hot flashes  She sees her gynecologist and dermatologist for women's health and skin checks.  -Diet: variety of foods, balance and well rounded, larger portion sizes  -Exercise: does yoga, walking rarely  -Taking folic acid, vitamin D or calcium: 2000 IU daily  -Diabetes and Dyslipidemia Screening: FASTING today  -Hx of HTN: no  -Vaccines: UTD  -pap history: s/p hysterectomy for fibroids/endometriosis, Sees Dr. Nori Riis  -FDLMP: n/a  -sexual activity: yes, female partner, no new partners  -wants STI testing (Hep C if born [birth year]): no  -FH breast, colon or ovarian ca: see FH Last mammogram: 03/2015 Last colon cancer screening: 02/2013 Sees Dr. Nori Riis for bone, breast, gyn health  -Alcohol, Tobacco, drug use: see social history  Review of Systems - no fevers, unintentional weight loss, vision loss, hearing loss, chest pain, sob, hemoptysis, melena, hematochezia, hematuria, genital discharge, changing or concerning skin lesions, bleeding, bruising, loc, thoughts of self harm or SI  Past Medical History  Diagnosis Date  . IBS (irritable bowel syndrome)      constipation predominant  . Interstitial cystitis     hx of UTI  . Hemorrhoids   . Allergy   . Blood transfusion without reported diagnosis     x3  . Heart murmur   . Osteoporosis     osteopenia  . GERD (gastroesophageal reflux disease)     takes Omeprazole daily, with nausea  . Anxiety and depression     with insomnia  . PONV (postoperative nausea and vomiting)     laryngospasm-1999  . History of bronchitis     winter of 2014  . Pneumonia     hx of;last time 69yrs ago  . History of migraine     last one a yr ago and takes Zomig prn  . Seasonal allergies     Flonase daily   . Numbness     both legs and related to back  . Arthritis     uses Diclofenac gel;Rheumatoid  . Chronic back pain     ? RA, ? Lupus - reports saw rheumatologist in the past but better now, sees Dr. Trenton Gammon  . Eczema   . History of kidney stones     passed on her own  . Anemia     after birth of child 89yrs ago  . Hx of echocardiogram     Echo (8/15):  EF 55-60%, no RWMA  . Thyroid nodule     on [place] 2015, she opted to not re-image  . HTN (hypertension) 03/31/2012  . Raynaud's disease /phenomenon 10/21/2012  . Hot flashes 11/10/2014  . Palpitations     on metoprolol in past but made  BP too low  . IBS (irritable bowel syndrome)     Past Surgical History  Procedure Laterality Date  . Abdominal hysterectomy  2007  . Tonsillectomy    . Excision morton's neuroma Right   . Colonoscopy  02/11/2013 and 12/24/04    internal and external hemorrhoids  . Flexible sigmoidoscopy  03/07/2008    internal and external hemorrhoids  . Upper gastrointestinal endoscopy  10/23/2010    gastritis, irregular Z-line  . Wisdom teeth extracted    . Uterine ablation      Family History  Problem Relation Age of Onset  . Coronary artery disease Father   . Diabetes Paternal Uncle   . Diverticulosis Mother   . Hypertension Mother   . Osteoarthritis Mother   . Colon cancer Neg Hx   . Heart attack Father   .  Hypertension Father   . Hypertension Maternal Grandmother   . Hypertension Maternal Grandfather   . Stroke Maternal Grandmother   . Stroke Paternal Grandfather   . Cancer Maternal Grandfather   . Depression Father   . Depression Mother   . Depression Paternal Grandmother   . Depression Sister   . Hyperlipidemia Father   . Hyperlipidemia Paternal Grandfather     Social History   Social History  . Marital Status: Married    Spouse Name: N/A  . Number of Children: N/A  . Years of Education: N/A   Occupational History  . RN [employer]   Social History Main Topics  . Smoking status: Never Smoker   . Smokeless tobacco: Never Used  . Alcohol Use: 0.5 oz/week    1 drink(s) per week     Comment: socially  . Drug Use: No  . Sexual Activity: Yes   Other Topics Concern  . 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  ALPRAZolam (XANAX XR) 0.5 MG 24 hr tablet, Take 0.25 mg by mouth 2 (two) times daily as needed., Disp: , Rfl: 0 .  busPIRone (BUSPAR) 10 MG tablet, Take 1 tablet (10 mg total) by mouth 2 (two) times daily. (Patient taking differently: Take 10 mg by mouth daily. ), Disp: 60 tablet, Rfl: 5 .  calcium-vitamin D (OSCAL WITH D) 500-200 MG-UNIT per tablet, Take 2 tablets by mouth every evening., Disp: , Rfl:  .  cholecalciferol (VITAMIN D) 1000 UNITS tablet, Take 1,000 Units by mouth every evening., Disp: , Rfl:  .  estradiol (VIVELLE-DOT) 0.075 MG/24HR, Place 1 patch onto the skin 2 (two) times a week., Disp: , Rfl:  .  fluticasone (FLONASE) 50 MCG/ACT nasal spray, Place 2 sprays into the nose daily., Disp: 16 g, Rfl: 5 .  hydrocortisone (ANUSOL-HC) 25 MG suppository, One at bedtime for 5 nights then as needed, Disp: 24 suppository, Rfl: 0 .  Multiple  Vitamin (MULTIVITAMIN WITH MINERALS) TABS, Take 1 tablet by mouth every evening., Disp: , Rfl:  .  omeprazole (PRILOSEC) 20 MG capsule, Take 1 capsule (20 mg total) by mouth daily., Disp: 90 capsule, Rfl: 3 .  ondansetron (ZOFRAN-ODT) 4 MG disintegrating tablet, Take 4 mg by mouth every 8 (eight) hours as needed for nausea., Disp: , Rfl:  .  sucralfate (CARAFATE) 1 GM/10ML  suspension, Take 10 mLs (1 g total) by mouth 3 (three) times daily as needed. For digestion, Disp: 840 mL, Rfl: 5 .  zolpidem (AMBIEN) 5 MG tablet, TAKE 1 TABLET BY MOUTH AT BEDTIME AS NEEDED FOR SLEEP, Disp: 30 tablet, Rfl: 0 .  ZOMIG 5 MG tablet, TAKE 1 TABLET BY MOUTH AT THE TIME OF HEADACHE, MAY REPEAT IN ONE HOUR IF HEADACHE PERSISTS, Disp: 9 tablet, Rfl: 0  EXAM:  Filed Vitals:   10/15/15 0830  BP: 104/58  Pulse: 73  Temp: 98.3 F (36.8 C)    GENERAL: vitals reviewed and listed below, alert, oriented, appears well hydrated and in no acute distress  HEENT: head atraumatic, PERRLA, normal appearance of eyes, ears, nose and mouth. moist mucus membranes.  NECK: supple, no masses or lymphadenopathy  LUNGS: clear to auscultation bilaterally, no rales, rhonchi or wheeze  CV: HRRR, no peripheral edema or cyanosis, normal pedal pulses  BREAST: sees gyn, not done  ABDOMEN: bowel sounds normal, soft, non tender to palpation, no masses, no rebound or guarding  GU: sees gyn, not done  SKIN: no rash or abnormal lesions  MS: normal gait, moves all extremities normally  NEURO: CN II-XII grossly intact, normal muscle strength and sensation to light touch on extremities  PSYCH: normal affect, pleasant and cooperative  ASSESSMENT AND PLAN:  Discussed the following assessment and plan:  Visit for preventive health examination - Plan: TSH, Lipid panel, Hemoglobin [id number], Basic metabolic panel  Poor vision  Essential hypertension  Irritable bowel syndrome with constipation  Gastroesophageal reflux disease,  esophagitis presence not specified  Anxiety and depression   -Discussed and advised all [place] preventive services health task force level A and B recommendations for age, sex and risks.  -Advised at least 150 minutes of exercise per week and a healthy diet low in saturated fats and sweets and consisting of fresh fruits and vegetables, lean meats such as fish and white chicken and whole grains.  -FASTING labs, studies and vaccines per orders this encounter  Orders Placed This Encounter  Procedures  . TSH  . Lipid panel  . Hemoglobin A1c  . Basic metabolic panel    Patient advised to return to clinic immediately if symptoms worsen or persist or new concerns.  Patient Instructions  BEFORE YOU LEAVE: -labs -follow up yearly for physical and as needed  -We have ordered labs or studies at this visit. It can take up to 1-2 weeks for results and processing. We will contact you with instructions IF your results are abnormal. Normal results will be released to your [facility]. If you have not heard from [place] or can not find your results in [facility] in 2 weeks please contact our office.  We recommend the following healthy lifestyle measures: - eat a healthy whole foods diet consisting of regular small meals composed of vegetables, fruits, beans, nuts, seeds, healthy meats such as white chicken and fish and whole grains.  - avoid sweets, white starchy foods, fried foods, fast food, processed foods, sodas, red meet and other fattening foods.  - get a least 150-300 minutes of aerobic exercise per week.   See your dermatologist and gynecologist yearly.         No Follow-up on file.  Colin Benton R.

## 2015-10-15 NOTE — Patient Instructions (Signed)
BEFORE YOU LEAVE: -labs -follow up yearly for physical and as needed  -We have ordered labs or studies at this visit. It can take up to 1-2 weeks for results and processing. We will contact you with instructions IF your results are abnormal. Normal results will be released to your [facility]. If you have not heard from [place] or can not find your results in [facility] in 2 weeks please contact our office.  We recommend the following healthy lifestyle measures: - eat a healthy whole foods diet consisting of regular small meals composed of vegetables, fruits, beans, nuts, seeds, healthy meats such as white chicken and fish and whole grains.  - avoid sweets, white starchy foods, fried foods, fast food, processed foods, sodas, red meet and other fattening foods.  - get a least 150-300 minutes of aerobic exercise per week.   See your dermatologist and gynecologist yearly.

## 2015-10-30 MED FILL — MINIVELLE 0.075 MG PATCH: 0.075 | 28 days supply | Qty: 8 | Fill #3

## 2015-10-30 MED FILL — ALPRAZolam 1 MG TABS: 1 | 30 days supply | Qty: 30 | Fill #2

## 2015-11-01 ENCOUNTER — Emergency Department (HOSPITAL_COMMUNITY)
Admission: EM | Admit: 2015-11-01 | Discharge: 2015-11-01 | Disposition: A | Discharge status: Home / Self Care | Payer: 59 | Attending: Emergency Medicine | Admitting: Emergency Medicine

## 2015-11-01 ENCOUNTER — Emergency Department (HOSPITAL_COMMUNITY): Payer: 59

## 2015-11-01 ENCOUNTER — Encounter (HOSPITAL_COMMUNITY): Payer: Self-pay

## 2015-11-01 DIAGNOSIS — H538 Other visual disturbances: Secondary | ICD-10-CM | POA: Insufficient documentation

## 2015-11-01 DIAGNOSIS — R110 Nausea: Secondary | ICD-10-CM | POA: Insufficient documentation

## 2015-11-01 DIAGNOSIS — Z87442 Personal history of urinary calculi: Secondary | ICD-10-CM | POA: Diagnosis not present

## 2015-11-01 DIAGNOSIS — M1990 Unspecified osteoarthritis, unspecified site: Secondary | ICD-10-CM | POA: Insufficient documentation

## 2015-11-01 DIAGNOSIS — Z8669 Personal history of other diseases of the nervous system and sense organs: Secondary | ICD-10-CM | POA: Insufficient documentation

## 2015-11-01 DIAGNOSIS — Z8709 Personal history of other diseases of the respiratory system: Secondary | ICD-10-CM | POA: Insufficient documentation

## 2015-11-01 DIAGNOSIS — Z79899 Other long term (current) drug therapy: Secondary | ICD-10-CM | POA: Diagnosis not present

## 2015-11-01 DIAGNOSIS — F329 Major depressive disorder, single episode, unspecified: Secondary | ICD-10-CM | POA: Diagnosis not present

## 2015-11-01 DIAGNOSIS — R51 Headache: Secondary | ICD-10-CM | POA: Diagnosis not present

## 2015-11-01 DIAGNOSIS — R200 Anesthesia of skin: Secondary | ICD-10-CM | POA: Diagnosis not present

## 2015-11-01 DIAGNOSIS — M810 Age-related osteoporosis without current pathological fracture: Secondary | ICD-10-CM | POA: Diagnosis not present

## 2015-11-01 DIAGNOSIS — I10 Essential (primary) hypertension: Secondary | ICD-10-CM | POA: Insufficient documentation

## 2015-11-01 HISTORY — DX: Other specified disorders of bone density and structure, unspecified site: M85.80

## 2015-11-01 LAB — BASIC METABOLIC PANEL
Anion gap: 10 (ref 5–15)
BUN: 17 mg/dL (ref 6–20)
CO2: 27 mmol/L (ref 22–32)
Calcium: 9.5 mg/dL (ref 8.9–10.3)
Chloride: 104 mmol/L (ref 101–111)
Creatinine, Ser: 0.94 mg/dL (ref 0.44–1.00)
GFR calc Af Amer: >60 mL/min (ref >60)
GFR calc non Af Amer: >60 mL/min (ref >60)
Glucose, Bld: 90 mg/dL (ref 65–99)
Potassium: 4.6 mmol/L (ref 3.5–5.1)
Sodium: 141 mmol/L (ref 135–145)

## 2015-11-01 LAB — I-STAT TROPONIN, ED: Troponin i, poc: 0 ng/mL (ref 0.00–0.08)

## 2015-11-01 LAB — CBC
HCT: 42.4 % (ref 36.0–46.0)
Hemoglobin: 14.5 g/dL (ref 12.0–15.0)
MCH: 30.7 pg (ref 26.0–34.0)
MCHC: 34.2 g/dL (ref 30.0–36.0)
MCV: 89.8 fL (ref 78.0–100.0)
Platelets: 197 10*3/uL (ref 150–400)
RBC: 4.72 MIL/uL (ref 3.87–5.11)
RDW: 13.3 % (ref 11.5–15.5)
WBC: 5 10*3/uL (ref 4.0–10.5)

## 2015-11-01 MED ORDER — TETRACAINE HCL 0.5 % OP SOLN
1.0000 [drp] | Freq: Once | OPHTHALMIC | Status: AC
  Administered 2015-11-01: 1 [drp] via OPHTHALMIC
  Filled 2015-11-01: qty 4

## 2015-11-01 MED ORDER — FLUORESCEIN SODIUM 1 MG OP STRP
1.0000 | ORAL_STRIP | Freq: Once | OPHTHALMIC | Status: AC
  Administered 2015-11-01: 1 via OPHTHALMIC
  Filled 2015-11-01: qty 1

## 2015-11-01 NOTE — ED Notes (Signed)
Patient states she woke this AM at 0600 with blurred vision and nausea.  Patient denies any numbness or weakness.

## 2015-11-01 NOTE — ED Provider Notes (Signed)
CSN: [id number]     Arrival date & time 11/01/15  1007 History   First MD Initiated Contact with Patient 11/01/15 1051     Chief Complaint  Patient presents with  . Nausea  . Blurred Vision     (Consider location/radiation/quality/duration/timing/severity/associated sxs/prior Treatment) HPI Comments: Patient presents emergency department with chief complaint of blurred vision. She states that when she woke this morning at 6 AM her vision was blurry. She denies any bright flashing lights. Denies any foreign body sensation, pain, discharge, or trauma to her eye. She denies any loss of visual fields. Denies any headache, numbness, weakness, tingling. She reports that she does have some associated nausea, but denies any chest pain, shortness of breath, or abdominal pain. There are no modifying factors. She has not taken anything for her symptoms. She has a history of hypertension, but has been well controlled, and was actually taken off of her blood pressure medicine because her blood pressure was too low.  The history is provided by the patient. No language interpreter was used.    Past Medical History  Diagnosis Date  . IBS (irritable bowel syndrome)     constipation predominant  . Interstitial cystitis     hx of UTI  . Hemorrhoids   . Allergy   . Blood transfusion without reported diagnosis     x3  . Heart murmur   . Osteoporosis     osteopenia  . GERD (gastroesophageal reflux disease)     takes Omeprazole daily, with nausea  . Anxiety and depression     with insomnia  . PONV (postoperative nausea and vomiting)     laryngospasm-1999  . History of bronchitis     winter of 2014  . Pneumonia     hx of;last time 60yrs ago  . History of migraine     last one a yr ago and takes Zomig prn  . Seasonal allergies     Flonase daily   . Numbness     both legs and related to back  . Arthritis     uses Diclofenac gel;Rheumatoid  . Chronic back pain     ? RA, ? Lupus - reports saw  rheumatologist in the past but better now, sees Dr. Trenton Gammon  . Eczema   . History of kidney stones     passed on her own  . Anemia     after birth of child 59yrs ago  . Hx of echocardiogram     Echo (8/15):  EF 55-60%, no RWMA  . HTN (hypertension) 03/31/2012  . Raynaud's disease /phenomenon 10/21/2012  . Hot flashes 11/10/2014  . Palpitations     on metoprolol in past but made BP too low  . IBS (irritable bowel syndrome)   . Osteopenia    Past Surgical History  Procedure Laterality Date  . Abdominal hysterectomy  2007  . Tonsillectomy    . Excision morton's neuroma Right   . Colonoscopy  02/11/2013 and 12/24/04    internal and external hemorrhoids  . Flexible sigmoidoscopy  03/07/2008    internal and external hemorrhoids  . Upper gastrointestinal endoscopy  10/23/2010    gastritis, irregular Z-line  . Wisdom teeth extracted    . Uterine ablation    . Back surgery     Family History  Problem Relation Age of Onset  . Coronary artery disease Father   . Diabetes Paternal Uncle   . Diverticulosis Mother   . Hypertension Mother   . Osteoarthritis Mother   .  Colon cancer Neg Hx   . Heart attack Father   . Hypertension Father   . Hypertension Maternal Grandmother   . Hypertension Maternal Grandfather   . Stroke Maternal Grandmother   . Stroke Paternal Grandfather   . Cancer Maternal Grandfather   . Depression Father   . Depression Mother   . Depression Paternal Grandmother   . Depression Sister   . Hyperlipidemia Father   . Hyperlipidemia Paternal Grandfather    Social History  Substance Use Topics  . Smoking status: Never Smoker   . Smokeless tobacco: Never Used  . Alcohol Use: 0.5 oz/week    1 Standard drinks or equivalent per week     Comment: socially   OB History    No data available     Review of Systems  Eyes: Positive for visual disturbance.  All other systems reviewed and are negative.     Allergies  Aspartame and phenylalanine; Beef-derived products;  Citrus; Penicillins; Promethazine hcl; Adhesive; Doxycycline; and Morphine sulfate  Home Medications   Prior to Admission medications   Medication Sig Start Date End Date Taking? Authorizing Provider  ALPRAZolam (XANAX XR) 0.5 MG 24 hr tablet Take 0.5 mg by mouth at bedtime.  06/13/15  Yes Historical Provider, MD  busPIRone (BUSPAR) 5 MG tablet Take 5 mg by mouth at bedtime.   Yes Historical Provider, MD  calcium-vitamin D (OSCAL WITH D) 500-200 MG-UNIT per tablet Take 2 tablets by mouth every evening.   Yes Historical Provider, MD  cholecalciferol (VITAMIN D) 1000 UNITS tablet Take 1,000 Units by mouth every evening.   Yes Historical Provider, MD  estradiol (VIVELLE-DOT) 0.075 MG/24HR Place 1 patch onto the skin 2 (two) times a week. Wednesday and Sunday   Yes Historical Provider, MD  FLUoxetine (PROZAC) 20 MG capsule Take 20 mg by mouth daily. 09/20/15  Yes Historical Provider, MD  lamoTRIgine (LAMICTAL) 25 MG tablet Take 50 mg by mouth at bedtime. 10/05/15  Yes Historical Provider, MD  Multiple Vitamin (MULTIVITAMIN WITH MINERALS) TABS Take 1 tablet by mouth every evening.   Yes Historical Provider, MD  omeprazole (PRILOSEC) 20 MG capsule Take 1 capsule (20 mg total) by mouth daily. Patient taking differently: Take 20 mg by mouth daily as needed (heart burn.).  05/26/14  Yes [name], MD  ondansetron (ZOFRAN-ODT) 4 MG disintegrating tablet Take 4 mg by mouth every 8 (eight) hours as needed for nausea.   Yes Historical Provider, MD  polyvinyl alcohol (LIQUIFILM TEARS) 1.4 % ophthalmic solution Place 1-2 drops into both eyes 3 (three) times daily.   Yes Historical Provider, MD  sucralfate (CARAFATE) 1 GM/10ML suspension Take 10 mLs (1 g total) by mouth 3 (three) times daily as needed. For digestion Patient taking differently: Take 1 g by mouth 3 (three) times daily as needed (digestion.).  10/12/14  Yes [name], MD  busPIRone (BUSPAR) 10 MG tablet Take 1 tablet (10 mg total) by mouth 2  (two) times daily. Patient not taking: Reported on 11/01/2015 06/18/15   [name], MD  fluticasone [facility]) 50 MCG/ACT nasal spray Place 2 sprays into the nose daily. Patient not taking: Reported on 11/01/2015 04/13/13   Kennyth Arnold, FNP  hydrocortisone (ANUSOL-HC) 25 MG suppository One at bedtime for 5 nights then as needed Patient not taking: Reported on 11/01/2015 09/18/14   [name], MD  zolpidem (AMBIEN) 5 MG tablet TAKE 1 TABLET BY MOUTH AT BEDTIME AS NEEDED FOR SLEEP Patient not taking: Reported  on 11/01/2015 05/17/15   [name], MD  ZOMIG 5 MG tablet TAKE 1 TABLET BY MOUTH AT THE TIME OF HEADACHE, MAY REPEAT IN ONE HOUR IF HEADACHE PERSISTS Patient not taking: Reported on 11/01/2015 12/27/13   Lisabeth Pick, MD   BP 135/93 mmHg  Pulse 75  Temp(Src) 97.4 F (36.3 C) (Oral)  Resp 15  Ht 5\' 3"  (1.6 m)  Wt 55.792 kg  BMI 21.79 kg/m2  SpO2 100% Physical Exam  Constitutional: She is oriented to person, place, and time. She appears well-developed and well-nourished.  HENT:  Head: Normocephalic and atraumatic.  No evidence of preseptal or periorbital cellulitis, no evidence of entrapment  Eyes: Conjunctivae and EOM are normal. Pupils are equal, round, and reactive to light.  No corneal abrasions, no fluorescein uptake, no foreign body, normal funduscopic exam, normal slit-lamp exam    Visual Acuity  Right Eye Distance: 20/40 Left Eye Distance: 20/100 Bilateral Distance: 20/40      Neck: Normal range of motion. Neck supple.  Cardiovascular: Normal rate and regular rhythm.  Exam reveals no gallop and no friction rub.   No murmur heard. Pulmonary/Chest: Effort normal and breath sounds normal. No respiratory distress. She has no wheezes. She has no rales. She exhibits no tenderness.  Abdominal: Soft. Bowel sounds are normal. She exhibits no distension and no mass. There is no tenderness. There is no rebound and no guarding.  Musculoskeletal: Normal range of motion.  She exhibits no edema or tenderness.  Neurological: She is alert and oriented to person, place, and time.  Skin: Skin is warm and dry.  Psychiatric: She has a normal mood and affect. Her behavior is normal. Judgment and thought content normal.  Nursing note and vitals reviewed.   ED Course  Procedures (including critical care time) Results for orders placed or performed during the hospital encounter of 11/01/15  CBC  Result Value Ref Range   WBC 5.0 4.0 - 10.5 K/uL   RBC 4.72 3.87 - 5.11 MIL/uL   Hemoglobin 14.5 12.0 - 15.0 g/dL   HCT 42.4 36.0 - 46.0 %   MCV 89.8 78.0 - 100.0 fL   MCH 30.7 26.0 - 34.0 pg   MCHC 34.2 30.0 - 36.0 g/dL   RDW 13.3 11.5 - 15.5 %   Platelets 197 150 - 400 K/uL  Basic metabolic panel  Result Value Ref Range   Sodium 141 135 - 145 mmol/L   Potassium 4.6 3.5 - 5.1 mmol/L   Chloride 104 101 - 111 mmol/L   CO2 27 22 - 32 mmol/L   Glucose, Bld 90 65 - 99 mg/dL   BUN 17 6 - 20 mg/dL   Creatinine, Ser 0.94 0.44 - 1.00 mg/dL   Calcium 9.5 8.9 - 10.3 mg/dL   GFR calc non Af Amer >60 >60 mL/min   GFR calc Af Amer >60 >60 mL/min   Anion gap 10 5 - 15  I-Stat Troponin, ED (not at [facility])  Result Value Ref Range   Troponin i, poc 0.00 0.00 - 0.08 ng/mL   Comment 3           Mr Brain Wo Contrast  11/01/2015  CLINICAL DATA:  Blurred vision and nausea EXAM: MRI HEAD WITHOUT CONTRAST TECHNIQUE: Multiplanar, multiecho pulse sequences of the brain and surrounding structures were obtained without intravenous contrast. COMPARISON:  CT head 04/28/2013 FINDINGS: Ventricle size normal.  Cerebral volume normal. Negative for acute infarct. Scattered small white matter hyperintensities similar to the MRI  of 07/31/2010. Mild patchy hyperintensity in the pons also unchanged. No cortical infarct. Negative for intracranial hemorrhage.  Negative for mass or edema. Paranasal sinuses clear. Normal orbit. Normal pituitary and skullbase. IMPRESSION: No acute abnormality. Mild chronic  changes in the white matter and pons likely related to chronic microvascular ischemia. No change from 07/31/2010. Electronically Signed   By: Franchot Gallo M.D.   On: 11/01/2015 14:29     Imaging Review Mr Brain Wo Contrast  11/01/2015  CLINICAL DATA:  Blurred vision and nausea EXAM: MRI HEAD WITHOUT CONTRAST TECHNIQUE: Multiplanar, multiecho pulse sequences of the brain and surrounding structures were obtained without intravenous contrast. COMPARISON:  CT head 04/28/2013 FINDINGS: Ventricle size normal.  Cerebral volume normal. Negative for acute infarct. Scattered small white matter hyperintensities similar to the MRI of 07/31/2010. Mild patchy hyperintensity in the pons also unchanged. No cortical infarct. Negative for intracranial hemorrhage.  Negative for mass or edema. Paranasal sinuses clear. Normal orbit. Normal pituitary and skullbase. IMPRESSION: No acute abnormality. Mild chronic changes in the white matter and pons likely related to chronic microvascular ischemia. No change from 07/31/2010. Electronically Signed   By: Franchot Gallo M.D.   On: 11/01/2015 14:29   I have personally reviewed and evaluated these images and lab results as part of my medical decision-making.   MDM   Final diagnoses:  Blurred vision    Patient with blurred vision that started this morning. She has no associated headaches, fevers, numbness, weakness, or tingling. She also has no other associated symptoms. She did have some nausea, but this is resolved. Vital signs are stable. Blood pressure is normal. No floaters, flashing lights, or eye pain. Presentation is unusual, discussed with Dr. Oleta Mouse, who recommends MRI of brain. Discussed the plan with the patient, who understands and agrees with the plan.   MRI is negative. Labs are perfect. Results discussed with Dr. Oleta Mouse, who recommends ophthalmology follow-up.  DC to home.    Montine Circle, PA-C 11/01/15 Cotter Liu, MD 11/02/15 [phone]

## 2015-11-01 NOTE — ED Notes (Addendum)
RN spoke with MRI who hopes to get Pt by 1330.  Pt made aware.

## 2015-11-01 NOTE — ED Notes (Signed)
Patient transported to MRI 

## 2015-11-01 NOTE — Discharge Instructions (Signed)
Blurred Vision  Having blurred vision means that you cannot see things clearly. Your vision may seem fuzzy or out of focus. Blurred vision is a very common symptom of an eye or vision problem. Blurred vision is often a gradual blur that occurs in one eye or both eyes. There are many causes of blurred vision, including cataracts, macular degeneration, and diabetic retinopathy.  Blurred vision can be diagnosed based on your symptoms and a physical exam. Tell your health care provider about any other health problems you have, any recent eye injury, and any prior surgeries. You may need to see a health care provider who specializes in eye problems (ophthalmologist). Your treatment depends on what is causing your blurred vision.   HOME CARE INSTRUCTIONS   Tell your health care provider about any changes in your blurred vision.   Do not drive or operate heavy machinery if your vision is blurry.   Keep all follow-up visits as directed by your health care provider. This is important.  SEEK MEDICAL CARE IF:   Your symptoms get worse.   You have new symptoms.   You have trouble seeing at night.   You have trouble seeing up close or far away.   You have trouble noticing the difference between colors.  SEEK IMMEDIATE MEDICAL CARE IF:   You have severe eye pain.   You have a severe headache.   You have flashing lights in your field of vision.   You have a sudden change in vision.   You have a sudden loss of vision.   You have vision change after an injury.   You notice drainage coming from your eyes.   You notice a rash around your eyes.     This information is not intended to replace advice given to you by your health care provider. Make sure you discuss any questions you have with your health care provider.     Document Released: 07/03/2003 Document Revised: 11/14/2014 Document Reviewed: 05/24/2014  Elsevier Interactive Patient Education 2016 Elsevier Inc.

## 2015-11-14 MED FILL — busPIRone HCL 5 MG TABS: 5 | 30 days supply | Qty: 120 | Fill #1

## 2015-11-14 MED FILL — FLUoxetine HCL 20 MG CAPS: 20 | 30 days supply | Qty: 30 | Fill #0

## 2015-11-14 MED FILL — lamoTRIgine 25 MG TABS: 25 | 30 days supply | Qty: 60 | Fill #1

## 2015-11-20 ENCOUNTER — Encounter: Payer: Self-pay | Admitting: Family Medicine

## 2015-11-20 ENCOUNTER — Ambulatory Visit (INDEPENDENT_AMBULATORY_CARE_PROVIDER_SITE_OTHER): Payer: 59 | Admitting: Family Medicine

## 2015-11-20 ENCOUNTER — Telehealth: Payer: Self-pay | Admitting: Family Medicine

## 2015-11-20 VITALS — BP 104/62 | HR 80 | Temp 98.7°F | Ht 63.0 in | Wt 122.6 lb

## 2015-11-20 DIAGNOSIS — B029 Zoster without complications: Secondary | ICD-10-CM

## 2015-11-20 MED ORDER — VALACYCLOVIR HCL 1 G PO TABS: 1000.0000 mg | ORAL_TABLET | Freq: Three times a day (TID) | ORAL | Status: DC

## 2015-11-20 MED FILL — valACYclovir HCL 1 GM TABS: 1 | 7 days supply | Qty: 21 | Fill #0

## 2015-11-20 NOTE — Progress Notes (Signed)
HPI:  [name] is a pleasant 60 year old here for an acute visit for "shingles". The past as feels the same. Started as a dull achy pain in the right lower flank, then 2 days ago she developed a vesicular rash in this area with 2 small vesicles. She has rubbed these, and they now are starting to scab over. Denies fever, nausea, vomiting, diarrhea, constipation, dysuria, insect bites or tick exposure.  ROS: See pertinent positives and negatives per HPI.  Past Medical History  Diagnosis Date  . IBS (irritable bowel syndrome)     constipation predominant  . Interstitial cystitis     hx of UTI  . Hemorrhoids   . Allergy   . Blood transfusion without reported diagnosis     x3  . Heart murmur   . Osteoporosis     osteopenia  . GERD (gastroesophageal reflux disease)     takes Omeprazole daily, with nausea  . Anxiety and depression     with insomnia  . PONV (postoperative nausea and vomiting)     laryngospasm-1999  . History of bronchitis     winter of 2014  . Pneumonia     hx of;last time 32yrs ago  . History of migraine     last one a yr ago and takes Zomig prn  . Seasonal allergies     Flonase daily   . Numbness     both legs and related to back  . Arthritis     uses Diclofenac gel;Rheumatoid  . Chronic back pain     ? RA, ? Lupus - reports saw rheumatologist in the past but better now, sees Dr. Trenton Gammon  . Eczema   . History of kidney stones     passed on her own  . Anemia     after birth of child 39yrs ago  . Hx of echocardiogram     Echo (8/15):  EF 55-60%, no RWMA  . HTN (hypertension) 03/31/2012  . Raynaud's disease /phenomenon 10/21/2012  . Hot flashes 11/10/2014  . Palpitations     on metoprolol in past but made BP too low  . IBS (irritable bowel syndrome)   . Osteopenia     Past Surgical History  Procedure Laterality Date  . Abdominal hysterectomy  2007  . Tonsillectomy    . Excision morton's neuroma Right   . Colonoscopy  02/11/2013 and 12/24/04   internal and external hemorrhoids  . Flexible sigmoidoscopy  03/07/2008    internal and external hemorrhoids  . Upper gastrointestinal endoscopy  10/23/2010    gastritis, irregular Z-line  . Wisdom teeth extracted    . Uterine ablation    . Back surgery      Family History  Problem Relation Age of Onset  . Coronary artery disease Father   . Diabetes Paternal Uncle   . Diverticulosis Mother   . Hypertension Mother   . Osteoarthritis Mother   . Colon cancer Neg Hx   . Heart attack Father   . Hypertension Father   . Hypertension Maternal Grandmother   . Hypertension Maternal Grandfather   . Stroke Maternal Grandmother   . Stroke Paternal Grandfather   . Cancer Maternal Grandfather   . Depression Father   . Depression Mother   . Depression Paternal Grandmother   . Depression Sister   . Hyperlipidemia Father   . Hyperlipidemia Paternal Grandfather     Social History   Social History  . Marital Status: Married    Spouse Name: N/A  .  Number of Children: N/A  . Years of Education: N/A   Occupational History  . RN [employer]   Social History Main Topics  . Smoking status: Never Smoker   . Smokeless tobacco: Never Used  . Alcohol Use: 0.5 oz/week    1 Standard drinks or equivalent per week     Comment: socially  . Drug Use: No  . Sexual Activity: Yes   Other Topics Concern  . 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  ALPRAZolam (XANAX XR) 0.5 MG 24 hr tablet, Take 0.5 mg by mouth at bedtime. , Disp: , Rfl: 0 .  busPIRone (BUSPAR) 10 MG tablet, Take 1 tablet (10 mg total) by mouth 2 (two) times daily., Disp: 60 tablet, Rfl: 5 .  busPIRone (BUSPAR) 5 MG tablet, Take 5 mg by mouth at bedtime., Disp: , Rfl:  .  calcium-vitamin D (OSCAL WITH D)  500-200 MG-UNIT per tablet, Take 2 tablets by mouth every evening., Disp: , Rfl:  .  cholecalciferol (VITAMIN D) 1000 UNITS tablet, Take 1,000 Units by mouth every evening., Disp: , Rfl:  .  estradiol (VIVELLE-DOT) 0.075 MG/24HR, Place 1 patch onto the skin 2 (two) times a week. Wednesday and Sunday, Disp: , Rfl:  .  FLUoxetine (PROZAC) 20 MG capsule, Take 20 mg by mouth daily., Disp: , Rfl: 5 .  fluticasone (FLONASE) 50 MCG/ACT nasal spray, Place 2 sprays into the nose daily., Disp: 16 g, Rfl: 5 .  hydrocortisone (ANUSOL-HC) 25 MG suppository, One at bedtime for 5 nights then as needed, Disp: 24 suppository, Rfl: 0 .  lamoTRIgine (LAMICTAL) 25 MG tablet, Take 50 mg by mouth at bedtime., Disp: , Rfl: 1 .  Multiple Vitamin (MULTIVITAMIN WITH MINERALS) TABS, Take 1 tablet by mouth every evening., Disp: , Rfl:  .  omeprazole (PRILOSEC) 20 MG capsule, Take 1 capsule (20 mg total) by mouth daily. (Patient taking differently: Take 20 mg by mouth daily as needed (heart burn.). ), Disp: 90 capsule, Rfl: 3 .  ondansetron (ZOFRAN-ODT) 4 MG disintegrating tablet, Take 4 mg by mouth every 8 (eight) hours as needed for nausea., Disp: , Rfl:  .  polyvinyl alcohol (LIQUIFILM TEARS) 1.4 % ophthalmic solution, Place 1-2 drops into both eyes 3 (three) times daily., Disp: , Rfl:  .  sucralfate (CARAFATE) 1 GM/10ML suspension, Take 10 mLs (1 g total) by mouth 3 (three) times daily as needed. For digestion (Patient taking differently: Take 1 g by mouth 3 (three) times daily as needed (digestion.). ), Disp: 840 mL, Rfl: 5 .  zolpidem (AMBIEN) 5 MG tablet, TAKE 1 TABLET BY MOUTH AT BEDTIME AS NEEDED FOR SLEEP, Disp: 30 tablet, Rfl: 0 .  ZOMIG 5 MG tablet, TAKE 1 TABLET BY MOUTH AT THE TIME OF HEADACHE, MAY REPEAT IN ONE HOUR IF HEADACHE PERSISTS, Disp: 9 tablet, Rfl: 0 .  valACYclovir (VALTREX) 1000 MG tablet, Take 1 tablet (1,000 mg total) by mouth 3 (three) times daily., Disp: 21 tablet, Rfl: 0  EXAM:  Filed Vitals:    11/20/15 1626  BP: 104/62  Pulse: 80  Temp: 98.7 F (37.1 C)    Body mass index is 21.72 kg/(m^2).  GENERAL: vitals reviewed  and listed above, alert, oriented, appears well hydrated and in no acute distress  HEENT: atraumatic, conjunttiva clear, no obvious abnormalities on inspection of external nose and ears  NECK: no obvious masses on inspection  LUNGS: clear to auscultation bilaterally, no wheezes, rales or rhonchi, good air movement  CV: HRRR, no peripheral edema  ABD: BS+, soft, NTTP  SKIM: 2 small scabbed erythematous lesions on the R lower flank  MS: moves all extremities without noticeable abnormality  PSYCH: pleasant and cooperative, no obvious depression or anxiety  ASSESSMENT AND PLAN:  Discussed the following assessment and plan:  Shingles  -Shingles likely given the history and exam findings, did discuss potential other etiologies and advised if she is not improving, develops other symptoms or concerns that she follow up promptly. Valtrex sent to pharmacy. Advised start ASAP. -Patient advised to return or notify a doctor immediately if symptoms worsen or persist or new concerns arise.  There are no Patient Instructions on file for this visit.   Colin Benton R.

## 2015-11-20 NOTE — Telephone Encounter (Signed)
Would definitely recommend visit to evaluate, confirm DX and treat. Please help her scheduled appointment today if possible at convenient time for her.

## 2015-11-20 NOTE — Progress Notes (Signed)
Pre visit review using our clinic review tool, if applicable. No additional management support is needed unless otherwise documented below in the visit note. 

## 2015-11-20 NOTE — Telephone Encounter (Signed)
I called the pt and scheduled an appt for today at 4:30pm. 

## 2015-11-20 NOTE — Telephone Encounter (Signed)
[facility]  Patient Name: [name]  DOB: 05/26/1956    Initial Comment caller states she has shingles coming back on her right hip - can she get an rx called in   Nurse Assessment  Nurse: Wynetta Emery, RN, Baker Janus Date/Time [name] Time): 11/20/2015 10:40:55 AM  Confirm and document reason for call. If symptomatic, describe symptoms. You must click the next button to save text entered. ---[name] has a lot of pain in right hip and has rash on hip. onset two days ago  Has the patient traveled out of the country within the last 30 days? ---No  Does the patient have any new or worsening symptoms? ---Yes  Will a triage be completed? ---Yes  Related visit to physician within the last 2 weeks? ---No  Does the PT have any chronic conditions? (i.e. diabetes, asthma, etc.) ---No  Is this a behavioral health or substance abuse call? ---No     Guidelines    Guideline Title Affirmed Question Affirmed Notes  Shingles [1] Shingles rash (matches SYMPTOMS) AND [2] weak immune system (e.g., HIV positive, cancer chemotherapy, chronic steroid treatment, splenectomy) AND [3] NOT taking antiviral medication    Final Disposition User   See Physician within 4 Hours (or PCP triage) Wynetta Emery, RN, Baker Janus    Comments  NOTE; [name] does not want to be seen states she knows this is shingles on right ileac crest started with the pain 10 days prior and now small patch of rash -- is requesting acyclovir called in to [facility] states will come in if it gets worse.   Referrals  GO TO FACILITY REFUSED   Disagree/Comply: Disagree  Disagree/Comply Reason: Disagree with instructions

## 2015-11-21 DIAGNOSIS — N816 Rectocele: Secondary | ICD-10-CM | POA: Diagnosis not present

## 2015-11-21 DIAGNOSIS — N8111 Cystocele, midline: Secondary | ICD-10-CM | POA: Diagnosis not present

## 2015-12-03 MED FILL — MINIVELLE 0.075 MG PATCH: 0.075 | 28 days supply | Qty: 8 | Fill #4

## 2015-12-07 ENCOUNTER — Other Ambulatory Visit: Payer: Self-pay

## 2015-12-07 MED ORDER — OMEPRAZOLE 20 MG PO CPDR: 20.0000 mg | DELAYED_RELEASE_CAPSULE | Freq: Every day | ORAL | Status: DC

## 2015-12-07 MED ORDER — SUCRALFATE 1 GM/10ML PO SUSP: 1.0000 g | Freq: Three times a day (TID) | ORAL | Status: DC | PRN

## 2015-12-07 MED FILL — CARAFATE 1 GM/10 ML SUSP: 1 | 28 days supply | Qty: 840 | Fill #0

## 2015-12-07 MED FILL — OMEPRAZOLE DR 20 MG CAPSULE: 20 | 90 days supply | Qty: 90 | Fill #0

## 2015-12-12 DIAGNOSIS — F331 Major depressive disorder, recurrent, moderate: Secondary | ICD-10-CM | POA: Diagnosis not present

## 2015-12-12 DIAGNOSIS — F39 Unspecified mood [affective] disorder: Secondary | ICD-10-CM | POA: Diagnosis not present

## 2015-12-31 ENCOUNTER — Telehealth: Payer: Self-pay

## 2015-12-31 DIAGNOSIS — K623 Rectal prolapse: Secondary | ICD-10-CM | POA: Diagnosis not present

## 2015-12-31 NOTE — Telephone Encounter (Signed)
-----   Message from Loralie Champagne, PA-C sent at 12/31/2015  8:49 AM EDT ----- PJ,  Dr. Celesta Aver. She called me this morning asking if we get her an appointment with Dr. Hassell Done at [facility] to evaluate and treat her hemorrhoids  She said that she has seen him before for similar issues a few years ago.  Let me know what you can do.  Thanks,  [employer]

## 2015-12-31 NOTE — Telephone Encounter (Signed)
Spoke with the triage nurse at [facility] and  Dr Lucita Ferrara can see her today 12/31/15 at 2:00pm, pt aware. Patient examined them with a mirror and they are thrombosed.

## 2016-01-07 ENCOUNTER — Telehealth: Payer: Self-pay | Admitting: Gastroenterology

## 2016-01-07 DIAGNOSIS — T753XXA Motion sickness, initial encounter: Secondary | ICD-10-CM

## 2016-01-07 MED ORDER — TRANSDERM-SCOP (1.5 MG) 1 MG/3DAYS TD PT72: MEDICATED_PATCH | TRANSDERMAL | Status: DC

## 2016-01-07 MED FILL — ALPRAZolam 1 MG TABS: 1 | 30 days supply | Qty: 30 | Fill #0

## 2016-01-07 MED FILL — MINIVELLE 0.075 MG PATCH: 0.075 | 28 days supply | Qty: 8 | Fill #5

## 2016-01-07 MED FILL — lamoTRIgine 25 MG TABS: 25 | 30 days supply | Qty: 60 | Fill #0

## 2016-01-07 MED FILL — ESTRACE 0.01% CREAM: 0.1 | 90 days supply | Qty: 43 | Fill #0

## 2016-01-07 MED FILL — TRANSDERM-SCOP 1.5 MG/3 DAY: 1 | 3 days supply | Qty: 2 | Fill #0

## 2016-01-07 NOTE — Telephone Encounter (Signed)
Patient asked for a scopolamine patch x 2 to use prior for an upcoming deep sea fishing trip to prevent motion sickness. It is ordered at the pharmacy for her to pick up

## 2016-01-30 DIAGNOSIS — H5213 Myopia, bilateral: Secondary | ICD-10-CM | POA: Diagnosis not present

## 2016-01-30 MED FILL — FLUoxetine HCL 20 MG CAPS: 20 | 30 days supply | Qty: 30 | Fill #1

## 2016-02-06 DIAGNOSIS — F331 Major depressive disorder, recurrent, moderate: Secondary | ICD-10-CM | POA: Diagnosis not present

## 2016-02-06 DIAGNOSIS — F39 Unspecified mood [affective] disorder: Secondary | ICD-10-CM | POA: Diagnosis not present

## 2016-02-11 MED FILL — MINIVELLE 0.075 MG PATCH: 0.075 | 28 days supply | Qty: 8 | Fill #6

## 2016-02-11 MED FILL — lamoTRIgine 25 MG TABS: 25 | 30 days supply | Qty: 60 | Fill #1

## 2016-02-11 MED FILL — XIIDRA 5% EYE DROPS: 5 | 30 days supply | Qty: 60 | Fill #0

## 2016-02-12 DIAGNOSIS — R198 Other specified symptoms and signs involving the digestive system and abdomen: Secondary | ICD-10-CM | POA: Diagnosis not present

## 2016-02-15 ENCOUNTER — Telehealth: Payer: Self-pay

## 2016-02-15 MED ORDER — NYSTATIN 100000 UNIT/ML MT SUSP: 5.0000 mL | Freq: Four times a day (QID) | OROMUCOSAL | 0 refills | Status: DC

## 2016-02-15 MED FILL — NYSTATIN 100,000 UNITS/ML S: 100000 | 20 days supply | Qty: 400 | Fill #0

## 2016-02-15 NOTE — Telephone Encounter (Signed)
-----   Message from Loralie Champagne, PA-C sent at 02/15/2016  1:45 PM EDT ----- Please send some nystatin mouthwash to pharmacy for patient to use four times daily for 10 days for possible thrush.  Thank you,  Jess

## 2016-02-15 NOTE — Telephone Encounter (Signed)
Rx sent to pharmacy for Trush.

## 2016-03-06 ENCOUNTER — Ambulatory Visit: Payer: Self-pay | Admitting: Family Medicine

## 2016-03-07 MED FILL — MINIVELLE 0.075 MG PATCH: 0.075 | 28 days supply | Qty: 8 | Fill #7

## 2016-03-11 MED FILL — ALPRAZolam 0.5 MG TABS: 0.5 | 30 days supply | Qty: 30 | Fill #0

## 2016-03-28 MED FILL — ESTRACE 0.01% CREAM: 0.1 | 90 days supply | Qty: 43 | Fill #0

## 2016-04-08 MED FILL — lamoTRIgine 25 MG TABS: 25 | 30 days supply | Qty: 60 | Fill #0

## 2016-04-08 MED FILL — FLUoxetine HCL 20 MG CAPS: 20 | 30 days supply | Qty: 30 | Fill #0

## 2016-04-09 ENCOUNTER — Other Ambulatory Visit: Payer: Self-pay | Admitting: Obstetrics & Gynecology

## 2016-04-09 DIAGNOSIS — Z1231 Encounter for screening mammogram for malignant neoplasm of breast: Secondary | ICD-10-CM

## 2016-04-16 MED FILL — PAZEO 0.7% EYE DROPS: 0.7 | 25 days supply | Qty: 3 | Fill #0

## 2016-04-17 ENCOUNTER — Ambulatory Visit
Admission: RE | Admit: 2016-04-17 | Discharge: 2016-04-17 | Disposition: A | Discharge status: Home / Self Care | Payer: 59 | Source: Ambulatory Visit | Attending: Obstetrics & Gynecology | Admitting: Obstetrics & Gynecology

## 2016-04-17 DIAGNOSIS — Z1231 Encounter for screening mammogram for malignant neoplasm of breast: Secondary | ICD-10-CM | POA: Diagnosis not present

## 2016-04-28 MED FILL — MINIVELLE 0.075 MG PATCH: 0.075 | 28 days supply | Qty: 8 | Fill #8

## 2016-05-12 MED FILL — ALPRAZolam 0.5 MG TABS: 0.5 | 30 days supply | Qty: 30 | Fill #1

## 2016-05-12 MED FILL — busPIRone HCL 5 MG TABS: 5 | 30 days supply | Qty: 120 | Fill #2

## 2016-05-13 ENCOUNTER — Other Ambulatory Visit: Payer: Self-pay | Admitting: Internal Medicine

## 2016-05-15 ENCOUNTER — Other Ambulatory Visit: Payer: Self-pay | Admitting: *Deleted

## 2016-05-15 MED ORDER — ZOMIG 5 MG PO TABS: ORAL_TABLET | ORAL | 0 refills | Status: DC

## 2016-05-15 MED FILL — ZOLMitriptan 5 MG TABS: 5 | 30 days supply | Qty: 9 | Fill #0

## 2016-05-16 MED FILL — lamoTRIgine 25 MG TABS: 25 | 30 days supply | Qty: 60 | Fill #0

## 2016-05-21 DIAGNOSIS — Z6821 Body mass index (BMI) 21.0-21.9, adult: Secondary | ICD-10-CM | POA: Diagnosis not present

## 2016-05-21 DIAGNOSIS — Z01419 Encounter for gynecological examination (general) (routine) without abnormal findings: Secondary | ICD-10-CM | POA: Diagnosis not present

## 2016-05-22 MED FILL — MINIVELLE 0.075 MG PATCH: 0.075 | 28 days supply | Qty: 8 | Fill #9

## 2016-06-02 ENCOUNTER — Encounter: Payer: Self-pay | Admitting: Family Medicine

## 2016-06-02 DIAGNOSIS — I7300 Raynaud's syndrome without gangrene: Secondary | ICD-10-CM

## 2016-06-02 NOTE — Telephone Encounter (Signed)
Pt requesting thyroid ultrasound okay to order?

## 2016-06-03 ENCOUNTER — Encounter: Payer: Self-pay | Admitting: Family Medicine

## 2016-06-03 ENCOUNTER — Ambulatory Visit (INDEPENDENT_AMBULATORY_CARE_PROVIDER_SITE_OTHER): Payer: 59 | Admitting: Family Medicine

## 2016-06-03 VITALS — BP 100/68 | HR 72 | Temp 98.6°F | Ht 63.0 in | Wt 123.3 lb

## 2016-06-03 DIAGNOSIS — R300 Dysuria: Secondary | ICD-10-CM

## 2016-06-03 LAB — POCT URINALYSIS DIPSTICK
Bilirubin, UA: NEGATIVE
Blood, UA: NEGATIVE
Glucose, UA: NEGATIVE
Ketones, UA: NEGATIVE
Leukocytes, UA: NEGATIVE
Nitrite, UA: NEGATIVE
Protein, UA: NEGATIVE
Spec Grav, UA: 1.01
Urobilinogen, UA: 0.2
pH, UA: 5

## 2016-06-03 MED ORDER — NITROFURANTOIN MONOHYD MACRO 100 MG PO CAPS: 100.0000 mg | ORAL_CAPSULE | Freq: Two times a day (BID) | ORAL | 0 refills | Status: DC

## 2016-06-03 MED FILL — NITROFURANTOIN MONO-MCR 100: 100 | 7 days supply | Qty: 14 | Fill #0

## 2016-06-03 NOTE — Patient Instructions (Signed)
BEFORE YOU LEAVE: -follow up: as needed for this issue -udip and culture  Plenty of water, can use Azo for symptoms per instructions.

## 2016-06-03 NOTE — Progress Notes (Signed)
HPI:   Acute visit for Dysuria: -started last night -reports remote hx of several UTIs and reports this feels exactly the same -symptoms include dysuria, urgency, frequency, a little low back pain -no abd pain, flank pain, vomiting, vaginal symptoms, hematuria -she wants abx even if dip neg as report has had dep neg, cult confirmed UTI in the past and is leaving town for several days  ROS: See pertinent positives and negatives per HPI.  Past Medical History:  Diagnosis Date  . Allergy   . Anemia    after birth of child 47yrs ago  . Anxiety and depression    with insomnia  . Arthritis    uses Diclofenac gel;Rheumatoid  . Blood transfusion without reported diagnosis    x3  . Chronic back pain    ? RA, ? Lupus - reports saw rheumatologist in the past but better now, sees Dr. Trenton Gammon  . Eczema   . GERD (gastroesophageal reflux disease)    takes Omeprazole daily, with nausea  . Heart murmur   . Hemorrhoids   . History of bronchitis    winter of 2014  . History of kidney stones    passed on her own  . History of migraine    last one a yr ago and takes Zomig prn  . Hot flashes 11/10/2014  . HTN (hypertension) 03/31/2012  . Hx of echocardiogram    Echo (8/15):  EF 55-60%, no RWMA  . IBS (irritable bowel syndrome)    constipation predominant  . IBS (irritable bowel syndrome)   . Interstitial cystitis    hx of UTI  . Numbness    both legs and related to back  . Osteopenia   . Osteoporosis    osteopenia  . Palpitations    on metoprolol in past but made BP too low  . Pneumonia    hx of;last time 60yrs ago  . PONV (postoperative nausea and vomiting)    laryngospasm-1999  . Raynaud's disease /phenomenon 10/21/2012  . Seasonal allergies    Flonase daily     Past Surgical History:  Procedure Laterality Date  . ABDOMINAL HYSTERECTOMY  2007  . BACK SURGERY    . COLONOSCOPY  02/11/2013 and 12/24/04   internal and external hemorrhoids  . EXCISION MORTON'S NEUROMA Right   .  FLEXIBLE SIGMOIDOSCOPY  03/07/2008   internal and external hemorrhoids  . TONSILLECTOMY    . UPPER GASTROINTESTINAL ENDOSCOPY  10/23/2010   gastritis, irregular Z-line  . uterine ablation    . wisdom teeth extracted      Family History  Problem Relation Age of Onset  . Coronary artery disease Father   . Diabetes Paternal Uncle   . Diverticulosis Mother   . Hypertension Mother   . Osteoarthritis Mother   . Colon cancer Neg Hx   . Heart attack Father   . Hypertension Father   . Hypertension Maternal Grandmother   . Hypertension Maternal Grandfather   . Stroke Maternal Grandmother   . Stroke Paternal Grandfather   . Cancer Maternal Grandfather   . Depression Father   . Depression Mother   . Depression Paternal Grandmother   . Depression Sister   . Hyperlipidemia Father   . Hyperlipidemia Paternal Grandfather     Social History   Social History  . Marital status: Married    Spouse name: N/A  . Number of children: N/A  . Years of education: N/A   Occupational History  . RN [employer]  History Main Topics  . Smoking status: Never Smoker  . Smokeless tobacco: Never Used  . Alcohol use 0.5 oz/week    1 Standard drinks or equivalent per week     Comment: socially  . Drug use: No  . Sexual activity: Yes   Other Topics Concern  . 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  ALPRAZolam (XANAX XR) 0.5 MG 24 hr tablet, Take 1 mg by mouth at bedtime. , Disp: , Rfl: 0 .  busPIRone (BUSPAR) 10 MG tablet, Take 1 tablet (10 mg total) by mouth 2 (two) times daily. (Patient taking differently: Take 10 mg by mouth daily. ), Disp: 60 tablet, Rfl: 5 .  calcium-vitamin D (OSCAL WITH D) 500-200 MG-UNIT per tablet, Take 2 tablets by mouth every evening., Disp:  , Rfl:  .  cholecalciferol (VITAMIN D) 1000 UNITS tablet, Take 1,000 Units by mouth every evening., Disp: , Rfl:  .  estradiol (VIVELLE-DOT) 0.075 MG/24HR, Place 1 patch onto the skin 2 (two) times a week. Wednesday and Sunday, Disp: , Rfl:  .  FLUoxetine (PROZAC) 20 MG capsule, Take 20 mg by mouth daily., Disp: , Rfl: 5 .  fluticasone (FLONASE) 50 MCG/ACT nasal spray, Place 2 sprays into the nose daily., Disp: 16 g, Rfl: 5 .  hydrocortisone (ANUSOL-HC) 25 MG suppository, One at bedtime for 5 nights then as needed, Disp: 24 suppository, Rfl: 0 .  lamoTRIgine (LAMICTAL) 25 MG tablet, Take 50 mg by mouth at bedtime., Disp: , Rfl: 1 .  Multiple Vitamin (MULTIVITAMIN WITH MINERALS) TABS, Take 1 tablet by mouth every evening., Disp: , Rfl:  .  nystatin (MYCOSTATIN) 100000 UNIT/ML suspension, Take 5 mLs (500,000 Units total) by mouth 4 (four) times daily., Disp: 400 mL, Rfl: 0 .  omeprazole (PRILOSEC) 20 MG capsule, Take 1 capsule (20 mg total) by mouth daily., Disp: 90 capsule, Rfl: 3 .  ondansetron (ZOFRAN-ODT) 4 MG disintegrating tablet, Take 4 mg by mouth every 8 (eight) hours as needed for nausea., Disp: , Rfl:  .  polyvinyl alcohol (LIQUIFILM TEARS) 1.4 % ophthalmic solution, Place 1-2 drops into both eyes 3 (three) times daily., Disp: , Rfl:  .  sucralfate (CARAFATE) 1 GM/10ML suspension, Take 10 mLs (1 g total) by mouth 3 (three) times daily as needed. For digestion, Disp: 840 mL, Rfl: 5 .  TRANSDERM-SCOP, 1.5 MG, 1 MG/3DAYS, Apply one patch prior to boat trip, every 72 hours, as needed for motion sickness, Disp: 2 patch, Rfl: 0 .  valACYclovir (VALTREX) 1000 MG tablet, Take 1 tablet (1,000 mg total) by mouth 3 (three) times daily., Disp: 21 tablet, Rfl: 0 .  ZOMIG 5 MG tablet, TAKE 1 TABLET BY MOUTH AT THE TIME OF HEADACHE, MAY REPEAT IN ONE HOUR IF HEADACHE PERSISTS, Disp: 9 tablet, Rfl: 0 .  nitrofurantoin, macrocrystal-monohydrate, (MACROBID) 100 MG capsule, Take 1 capsule (100 mg total) by  mouth 2 (two) times daily., Disp: 14 capsule, Rfl: 0  EXAM:  Vitals:   06/03/16 1632  BP: 100/68  Pulse: 72  Temp: 98.6 F (37 C)    Body mass index is 21.84 kg/m.  GENERAL: vitals reviewed and listed above, alert, oriented, appears well hydrated and  in no acute distress  HEENT: atraumatic, conjunttiva clear, no obvious abnormalities on inspection of external nose and ears  NECK: no obvious masses on inspection  LUNGS: clear to auscultation bilaterally, no wheezes, rales or rhonchi, good air movement  CV: HRRR, no peripheral edema  ABD: BS+, soft, NTTP, no CVA TTP  MS: moves all extremities without noticeable abnormality  PSYCH: pleasant and cooperative, no obvious depression or anxiety  ASSESSMENT AND PLAN:  Discussed the following assessment and plan:  Dysuria - Plan: POC Urinalysis Dipstick, Culture, Urine -dip and culture -macrobid rx after discussion risks as she is adamant that she has had cases of urine dip negative, culture positive UTI in the past with the same symptoms -Advised trial of fluids and cranberry prior to using the antibiotic and to only use if worsening or not improving over the next few days and has not heard back about culture - going into long holiday weekend, so she likely would not be contacted about culture until next week -Patient advised to return or notify a doctor immediately if symptoms worsen or persist or new concerns arise.  Patient Instructions  BEFORE YOU LEAVE: -follow up: as needed for this issue -udip and culture  Plenty of water, can use Azo for symptoms per instructions.      Colin Benton R., DO

## 2016-06-03 NOTE — Progress Notes (Signed)
Pre visit review using our clinic review tool, if applicable. No additional management support is needed unless otherwise documented below in the visit note. 

## 2016-06-05 LAB — URINE CULTURE: Organism ID, Bacteria: NO GROWTH

## 2016-06-11 DIAGNOSIS — F39 Unspecified mood [affective] disorder: Secondary | ICD-10-CM | POA: Diagnosis not present

## 2016-06-11 DIAGNOSIS — F331 Major depressive disorder, recurrent, moderate: Secondary | ICD-10-CM | POA: Diagnosis not present

## 2016-06-18 MED FILL — busPIRone HCL 15 MG TABS: 15 | 30 days supply | Qty: 60 | Fill #0

## 2016-06-18 MED FILL — XIIDRA 5% EYE DROPS: 5 | 30 days supply | Qty: 60 | Fill #1

## 2016-06-18 MED FILL — lamoTRIgine 200 MG TABS: 200 | 30 days supply | Qty: 30 | Fill #0

## 2016-06-18 MED FILL — FLUoxetine HCL 20 MG CAPS: 20 | 30 days supply | Qty: 30 | Fill #0

## 2016-06-25 DIAGNOSIS — L719 Rosacea, unspecified: Secondary | ICD-10-CM | POA: Diagnosis not present

## 2016-06-25 DIAGNOSIS — L219 Seborrheic dermatitis, unspecified: Secondary | ICD-10-CM | POA: Diagnosis not present

## 2016-06-25 DIAGNOSIS — D239 Other benign neoplasm of skin, unspecified: Secondary | ICD-10-CM | POA: Diagnosis not present

## 2016-06-25 DIAGNOSIS — L814 Other melanin hyperpigmentation: Secondary | ICD-10-CM | POA: Diagnosis not present

## 2016-06-25 DIAGNOSIS — Z23 Encounter for immunization: Secondary | ICD-10-CM | POA: Diagnosis not present

## 2016-06-25 DIAGNOSIS — D225 Melanocytic nevi of trunk: Secondary | ICD-10-CM | POA: Diagnosis not present

## 2016-06-25 DIAGNOSIS — L821 Other seborrheic keratosis: Secondary | ICD-10-CM | POA: Diagnosis not present

## 2016-06-25 DIAGNOSIS — D1801 Hemangioma of skin and subcutaneous tissue: Secondary | ICD-10-CM | POA: Diagnosis not present

## 2016-06-25 DIAGNOSIS — L309 Dermatitis, unspecified: Secondary | ICD-10-CM | POA: Diagnosis not present

## 2016-06-26 MED FILL — MINIVELLE 0.075 MG PATCH: 0.075 | 28 days supply | Qty: 8 | Fill #10

## 2016-07-23 DIAGNOSIS — M2550 Pain in unspecified joint: Secondary | ICD-10-CM | POA: Diagnosis not present

## 2016-07-23 DIAGNOSIS — I7300 Raynaud's syndrome without gangrene: Secondary | ICD-10-CM | POA: Diagnosis not present

## 2016-07-23 DIAGNOSIS — M0600 Rheumatoid arthritis without rheumatoid factor, unspecified site: Secondary | ICD-10-CM | POA: Diagnosis not present

## 2016-07-23 DIAGNOSIS — R768 Other specified abnormal immunological findings in serum: Secondary | ICD-10-CM | POA: Diagnosis not present

## 2016-07-23 DIAGNOSIS — R5383 Other fatigue: Secondary | ICD-10-CM | POA: Diagnosis not present

## 2016-07-23 DIAGNOSIS — M79641 Pain in right hand: Secondary | ICD-10-CM | POA: Diagnosis not present

## 2016-07-23 DIAGNOSIS — Z6821 Body mass index (BMI) 21.0-21.9, adult: Secondary | ICD-10-CM | POA: Diagnosis not present

## 2016-07-23 DIAGNOSIS — M79642 Pain in left hand: Secondary | ICD-10-CM | POA: Diagnosis not present

## 2016-07-23 DIAGNOSIS — M150 Primary generalized (osteo)arthritis: Secondary | ICD-10-CM | POA: Diagnosis not present

## 2016-07-23 MED FILL — lamoTRIgine 200 MG TABS: 200 | 30 days supply | Qty: 30 | Fill #1

## 2016-07-23 MED FILL — AMLODIPINE BESYLATE 5 MG TA: 5 | 30 days supply | Qty: 30 | Fill #0

## 2016-07-23 MED FILL — MELOXICAM 15 MG TABLET: 15 | 30 days supply | Qty: 30 | Fill #0

## 2016-08-08 MED FILL — MINIVELLE 0.075 MG PATCH: 0.075 | 84 days supply | Qty: 24 | Fill #0

## 2016-08-13 DIAGNOSIS — I7300 Raynaud's syndrome without gangrene: Secondary | ICD-10-CM | POA: Diagnosis not present

## 2016-08-13 DIAGNOSIS — R768 Other specified abnormal immunological findings in serum: Secondary | ICD-10-CM | POA: Diagnosis not present

## 2016-08-13 DIAGNOSIS — R5383 Other fatigue: Secondary | ICD-10-CM | POA: Diagnosis not present

## 2016-08-13 DIAGNOSIS — Z1589 Genetic susceptibility to other disease: Secondary | ICD-10-CM | POA: Diagnosis not present

## 2016-08-13 DIAGNOSIS — M150 Primary generalized (osteo)arthritis: Secondary | ICD-10-CM | POA: Diagnosis not present

## 2016-08-13 DIAGNOSIS — M0600 Rheumatoid arthritis without rheumatoid factor, unspecified site: Secondary | ICD-10-CM | POA: Diagnosis not present

## 2016-08-13 DIAGNOSIS — Z6821 Body mass index (BMI) 21.0-21.9, adult: Secondary | ICD-10-CM | POA: Diagnosis not present

## 2016-08-13 DIAGNOSIS — M2550 Pain in unspecified joint: Secondary | ICD-10-CM | POA: Diagnosis not present

## 2016-08-18 ENCOUNTER — Telehealth: Payer: Self-pay

## 2016-08-18 DIAGNOSIS — H01022 Squamous blepharitis right lower eyelid: Secondary | ICD-10-CM | POA: Diagnosis not present

## 2016-08-18 DIAGNOSIS — H10503 Unspecified blepharoconjunctivitis, bilateral: Secondary | ICD-10-CM | POA: Diagnosis not present

## 2016-08-18 DIAGNOSIS — H01024 Squamous blepharitis left upper eyelid: Secondary | ICD-10-CM | POA: Diagnosis not present

## 2016-08-18 DIAGNOSIS — H01025 Squamous blepharitis left lower eyelid: Secondary | ICD-10-CM | POA: Diagnosis not present

## 2016-08-18 DIAGNOSIS — H01021 Squamous blepharitis right upper eyelid: Secondary | ICD-10-CM | POA: Diagnosis not present

## 2016-08-18 DIAGNOSIS — H04123 Dry eye syndrome of bilateral lacrimal glands: Secondary | ICD-10-CM | POA: Diagnosis not present

## 2016-08-18 MED ORDER — HYDROCORTISONE ACETATE 25 MG RE SUPP: RECTAL | 1 refills | Status: DC

## 2016-08-18 MED FILL — ANUSOL-HC 25 MG SUPPOSITORY: 25 | 24 days supply | Qty: 24 | Fill #0

## 2016-08-18 MED FILL — FLUOROMETHOLONE 0.1% DROPS: 0.1 | 25 days supply | Qty: 10 | Fill #0

## 2016-08-18 NOTE — Telephone Encounter (Signed)
Ok x 2  

## 2016-08-18 NOTE — Telephone Encounter (Signed)
Patient informed that rx sent in to [facility].

## 2016-08-18 NOTE — Telephone Encounter (Signed)
Patient's hemorrhoids have flared up and she request refill on her medicine for this.   Thank you Sir.

## 2016-08-27 DIAGNOSIS — F4323 Adjustment disorder with mixed anxiety and depressed mood: Secondary | ICD-10-CM | POA: Diagnosis not present

## 2016-08-27 MED FILL — XIIDRA 5% EYE DROPS: 5 | 30 days supply | Qty: 60 | Fill #2

## 2016-08-28 ENCOUNTER — Encounter: Payer: Self-pay | Admitting: Family Medicine

## 2016-08-29 NOTE — Telephone Encounter (Signed)
I spoke with the pt prior to lunch time, offered an appt for this afternoon and she stated she was working and cannot come in today.  I placed the pt on hold and offered to have the scheduler add her to the Saturday clinic at [facility] and we were disconnected.  I left a detailed message for the pt to call the office today after 1pm to talk to a scheduler in order to be placed on the Saturday clinic.

## 2016-09-03 DIAGNOSIS — F4323 Adjustment disorder with mixed anxiety and depressed mood: Secondary | ICD-10-CM | POA: Diagnosis not present

## 2016-09-09 MED FILL — lamoTRIgine 200 MG TABS: 200 | 7 days supply | Qty: 7 | Fill #0

## 2016-09-17 DIAGNOSIS — F4323 Adjustment disorder with mixed anxiety and depressed mood: Secondary | ICD-10-CM | POA: Diagnosis not present

## 2016-09-17 DIAGNOSIS — F411 Generalized anxiety disorder: Secondary | ICD-10-CM | POA: Diagnosis not present

## 2016-09-17 MED FILL — lamoTRIgine 200 MG TABS: 200 | 30 days supply | Qty: 30 | Fill #0

## 2016-09-17 MED FILL — ALPRAZolam 0.5 MG TABS: 0.5 | 22 days supply | Qty: 45 | Fill #0

## 2016-09-17 MED FILL — busPIRone HCL 15 MG TABS: 15 | 30 days supply | Qty: 120 | Fill #0

## 2016-09-17 MED FILL — FLUoxetine HCL 20 MG CAPS: 20 | 30 days supply | Qty: 30 | Fill #0

## 2016-09-23 DIAGNOSIS — H01021 Squamous blepharitis right upper eyelid: Secondary | ICD-10-CM | POA: Diagnosis not present

## 2016-09-23 DIAGNOSIS — H01024 Squamous blepharitis left upper eyelid: Secondary | ICD-10-CM | POA: Diagnosis not present

## 2016-09-23 DIAGNOSIS — H01022 Squamous blepharitis right lower eyelid: Secondary | ICD-10-CM | POA: Diagnosis not present

## 2016-09-23 DIAGNOSIS — H04123 Dry eye syndrome of bilateral lacrimal glands: Secondary | ICD-10-CM | POA: Diagnosis not present

## 2016-09-23 DIAGNOSIS — H10503 Unspecified blepharoconjunctivitis, bilateral: Secondary | ICD-10-CM | POA: Diagnosis not present

## 2016-09-23 DIAGNOSIS — H01025 Squamous blepharitis left lower eyelid: Secondary | ICD-10-CM | POA: Diagnosis not present

## 2016-10-15 DIAGNOSIS — F411 Generalized anxiety disorder: Secondary | ICD-10-CM | POA: Diagnosis not present

## 2016-10-20 MED FILL — FLUoxetine HCL 20 MG CAPS: 20 | 30 days supply | Qty: 30 | Fill #1

## 2016-10-21 MED FILL — XIIDRA 5% EYE DROPS: 5 | 30 days supply | Qty: 60 | Fill #3

## 2016-10-22 DIAGNOSIS — F4323 Adjustment disorder with mixed anxiety and depressed mood: Secondary | ICD-10-CM | POA: Diagnosis not present

## 2016-10-23 MED FILL — PAZEO 0.7% EYE DROPS: 0.7 | 30 days supply | Qty: 3 | Fill #0

## 2016-10-23 MED FILL — busPIRone HCL 15 MG TABS: 15 | 30 days supply | Qty: 120 | Fill #0

## 2016-10-23 MED FILL — ALPRAZolam 0.5 MG TABS: 0.5 | 22 days supply | Qty: 45 | Fill #0

## 2016-10-28 MED FILL — lamoTRIgine 200 MG TABS: 200 | 30 days supply | Qty: 30 | Fill #1

## 2016-11-05 DIAGNOSIS — F411 Generalized anxiety disorder: Secondary | ICD-10-CM | POA: Diagnosis not present

## 2016-11-07 ENCOUNTER — Encounter: Payer: Self-pay | Admitting: Family Medicine

## 2016-11-07 ENCOUNTER — Ambulatory Visit (INDEPENDENT_AMBULATORY_CARE_PROVIDER_SITE_OTHER): Payer: 59 | Admitting: Family Medicine

## 2016-11-07 VITALS — BP 118/80 | HR 74 | Temp 98.6°F | Ht 63.0 in | Wt 120.0 lb

## 2016-11-07 DIAGNOSIS — R4189 Other symptoms and signs involving cognitive functions and awareness: Secondary | ICD-10-CM | POA: Diagnosis not present

## 2016-11-07 NOTE — Patient Instructions (Addendum)
BEFORE YOU LEAVE: -follow up: CPE in 3 months -lab  -We placed a referral for you as discussed to the neurologist. It usually takes about 1-2 weeks to process and schedule this referral. If you have not heard from [place] regarding this appointment in 2 weeks please contact our office.   We recommend the following healthy lifestyle for LIFE: Eat a healthy clean diet.  * Tip: Avoid (less then 1 serving per week): processed foods, sweets, sweetened drinks, white starches (rice, flour, bread, potatoes, pasta, etc), red meat, fast foods, butter  *Tip: CHOOSE instead   * 5-9 servings per day of fresh or frozen fruits and vegetables (but not corn, potatoes, bananas, canned or dried fruit)   *nuts and seeds, beans   *olives and olive oil   *small portions of lean meats such as fish and white chicken    *small portions of whole grains  Get at least 150 minutes of sweaty aerobic exercise per week.  Reduce stress - consider counseling, meditation and relaxation to balance other aspects of your life.

## 2016-11-07 NOTE — Progress Notes (Signed)
Pre visit review using our clinic review tool, if applicable. No additional management support is needed unless otherwise documented below in the visit note. 

## 2016-11-07 NOTE — Progress Notes (Signed)
HPI:  Acute visit for Memory Concerns: -reports started 2 years ago -symptoms: worsening memory, misplaces things, left iron on once, forgets what she is doing, forgets her own phone number at times -good with people, faces and directions -reports husband, coworker and her therapist have made remarks about her memory -she is very worried about this -has longstanding anxiety and depression which she feels is stable -reports hx of remote extensive eval at [facility] for tingling sensations and was told she had MS and then was told she did not -she forgot she had MRI in 2017 - "No acute abnormality. Mild chronic changes in the white matter and pons likely related to chronic microvascular ischemia. No change from 07/31/2010." per radiology report -she wants to see a neurologist and is here for a referral Denies worsening depression, weakness, fevers, malaise, speech changes, hearin gor vision changes. Reports leg feel tingly whenever she stands up - not new.  ROS: See pertinent positives and negatives per HPI.  Past Medical History:  Diagnosis Date  . Allergy   . Anemia    after birth of child 46yrs ago  . Anxiety and depression    with insomnia  . Arthritis    uses Diclofenac gel;Rheumatoid  . Blood transfusion without reported diagnosis    x3  . Chronic back pain    ? RA, ? Lupus - reports saw rheumatologist in the past but better now, sees Dr. Trenton Gammon  . Eczema   . GERD (gastroesophageal reflux disease)    takes Omeprazole daily, with nausea  . Heart murmur   . Hemorrhoids   . History of bronchitis    winter of 2014  . History of kidney stones    passed on her own  . History of migraine    last one a yr ago and takes Zomig prn  . Hot flashes 11/10/2014  . HTN (hypertension) 03/31/2012  . Hx of echocardiogram    Echo (8/15):  EF 55-60%, no RWMA  . IBS (irritable bowel syndrome)    constipation predominant  . IBS (irritable bowel syndrome)   . Interstitial cystitis    hx of  UTI  . Numbness    both legs and related to back  . Osteopenia   . Osteoporosis    osteopenia  . Palpitations    on metoprolol in past but made BP too low  . Pneumonia    hx of;last time 21yrs ago  . PONV (postoperative nausea and vomiting)    laryngospasm-1999  . Raynaud's disease /phenomenon 10/21/2012  . Seasonal allergies    Flonase daily     Past Surgical History:  Procedure Laterality Date  . ABDOMINAL HYSTERECTOMY  2007  . BACK SURGERY    . COLONOSCOPY  02/11/2013 and 12/24/04   internal and external hemorrhoids  . EXCISION MORTON'S NEUROMA Right   . FLEXIBLE SIGMOIDOSCOPY  03/07/2008   internal and external hemorrhoids  . TONSILLECTOMY    . UPPER GASTROINTESTINAL ENDOSCOPY  10/23/2010   gastritis, irregular Z-line  . uterine ablation    . wisdom teeth extracted      Family History  Problem Relation Age of Onset  . Coronary artery disease Father   . Diabetes Paternal Uncle   . Diverticulosis Mother   . Hypertension Mother   . Osteoarthritis Mother   . Colon cancer Neg Hx   . Heart attack Father   . Hypertension Father   . Hypertension Maternal Grandmother   . Hypertension Maternal Grandfather   . Stroke  Maternal Grandmother   . Stroke Paternal Grandfather   . Cancer Maternal Grandfather   . Depression Father   . Depression Mother   . Depression Paternal Grandmother   . Depression Sister   . Hyperlipidemia Father   . Hyperlipidemia Paternal Grandfather     Social History   Social History  . Marital status: Married    Spouse name: N/A  . Number of children: N/A  . Years of education: N/A   Occupational History  . RN [employer]   Social History Main Topics  . Smoking status: Never Smoker  . Smokeless tobacco: Never Used  . Alcohol use 0.5 oz/week    1 Standard drinks or equivalent per week     Comment: socially  . Drug use: No  . Sexual activity: Yes   Other Topics Concern  . 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  ALPRAZolam (XANAX XR) 0.5 MG 24 hr tablet, Take 1 mg by mouth at bedtime. , Disp: , Rfl: 0 .  busPIRone (BUSPAR) 10 MG tablet, Take 1 tablet (10 mg total) by mouth 2 (two) times daily. (Patient taking differently: Take 10 mg by mouth daily. ), Disp: 60 tablet, Rfl: 5 .  calcium-vitamin D (OSCAL WITH D) 500-200 MG-UNIT per tablet, Take 2 tablets by mouth every evening., Disp: , Rfl:  .  cholecalciferol (VITAMIN D) 1000 UNITS tablet, Take 1,000 Units by mouth every evening., Disp: , Rfl:  .  estradiol (VIVELLE-DOT) 0.075 MG/24HR, Place 1 patch onto the skin 2 (two) times a week. Wednesday and Sunday, Disp: , Rfl:  .  FLUoxetine (PROZAC) 20 MG capsule, Take 20 mg by mouth daily., Disp: , Rfl: 5 .  fluticasone (FLONASE) 50 MCG/ACT nasal spray, Place 2 sprays into the nose daily., Disp: 16 g, Rfl: 5 .  hydrocortisone (ANUSOL-HC) 25 MG suppository, One at bedtime for 5 nights then as needed, Disp: 24 suppository, Rfl: 1 .  lamoTRIgine (LAMICTAL) 25 MG tablet, Take 50 mg by mouth at bedtime., Disp: , Rfl: 1 .  Multiple Vitamin (MULTIVITAMIN WITH MINERALS) TABS, Take 1 tablet by mouth every evening., Disp: , Rfl:  .  omeprazole (PRILOSEC) 20 MG capsule, Take 1 capsule (20 mg total) by mouth daily., Disp: 90 capsule, Rfl: 3 .  polyvinyl alcohol (LIQUIFILM TEARS) 1.4 % ophthalmic solution, Place 1-2 drops into both eyes 3 (three) times daily., Disp: , Rfl:  .  sucralfate (CARAFATE) 1 GM/10ML suspension, Take 10 mLs (1 g total) by mouth 3 (three) times daily as needed. For digestion, Disp: 840 mL, Rfl: 5 .  ZOMIG 5 MG tablet, TAKE 1 TABLET BY MOUTH AT THE TIME OF HEADACHE, MAY REPEAT IN ONE HOUR IF HEADACHE PERSISTS, Disp: 9 tablet, Rfl: 0  EXAM:  Vitals:   11/07/16 1616  BP: 118/80  Pulse: 74  Temp: 98.6  F (37 C)    Body mass index is 21.26 kg/m.  GENERAL: vitals reviewed and listed above, alert, oriented, appears well hydrated and in no acute distress, Ox3  HEENT: atraumatic, conjunttiva clear, no obvious abnormalities on inspection of external nose and ears  NECK: no obvious masses on inspection  LUNGS: clear to auscultation bilaterally, no  wheezes, rales or rhonchi, good air movement  CV: HRRR, no peripheral edema  MS: moves all extremities without noticeable abnormality  PSYCH: pleasant and cooperative, no obvious depression or anxiety, CN II-XII Grossly intact, finger to nose normal, Mini-cog neg, Ox3, recall of current events seems good, speech and thought processing grossly intact  ASSESSMENT AND PLAN:  Discussed the following assessment and plan:  Cognitive changes - Plan: Ambulatory referral to Neurology, Vitamin B12, CANCELED: Vitamin B12  -referral per her request to neurology - brief exam here today ok, had MRI last year; she wil perhaps need neuropsych testing; she seems very worried about undiagnosed MS -she has mildly elevated diastolic BP - but is pursuing healthy lifestyle for this -will check B12, she has hx lower B12 and admits is not good about taking b12 -Patient advised to return or notify a doctor immediately if symptoms worsen or persist or new concerns arise.  Patient Instructions  BEFORE YOU LEAVE: -follow up: CPE in 3 months -lab  -We placed a referral for you as discussed to the neurologist. It usually takes about 1-2 weeks to process and schedule this referral. If you have not heard from [place] regarding this appointment in 2 weeks please contact our office.   We recommend the following healthy lifestyle for LIFE: Eat a healthy clean diet.  * Tip: Avoid (less then 1 serving per week): processed foods, sweets, sweetened drinks, white starches (rice, flour, bread, potatoes, pasta, etc), red meat, fast foods, butter  *Tip: CHOOSE instead   * 5-9  servings per day of fresh or frozen fruits and vegetables (but not corn, potatoes, bananas, canned or dried fruit)   *nuts and seeds, beans   *olives and olive oil   *small portions of lean meats such as fish and white chicken    *small portions of whole grains  Get at least 150 minutes of sweaty aerobic exercise per week.  Reduce stress - consider counseling, meditation and relaxation to balance other aspects of your life.      Colin Benton R., DO

## 2016-11-08 LAB — VITAMIN B12: Vitamin B-12: 515 pg/mL (ref 200–1100)

## 2016-11-11 ENCOUNTER — Encounter: Payer: Self-pay | Admitting: Neurology

## 2016-11-14 ENCOUNTER — Encounter: Payer: Self-pay | Admitting: Family Medicine

## 2016-11-17 ENCOUNTER — Ambulatory Visit: Payer: 59 | Admitting: Family Medicine

## 2016-11-24 MED FILL — metroNIDAZOLE 0.75 % LOTN: 0.75 | 20 days supply | Qty: 59 | Fill #0

## 2016-11-24 MED FILL — CLOBETASOL 0.05% SOLUTION: 0.05 | 10 days supply | Qty: 50 | Fill #0

## 2016-11-25 MED FILL — MINIVELLE 0.075 MG PATCH: 0.075 | 84 days supply | Qty: 24 | Fill #1

## 2016-11-26 DIAGNOSIS — R768 Other specified abnormal immunological findings in serum: Secondary | ICD-10-CM | POA: Diagnosis not present

## 2016-11-26 DIAGNOSIS — Z1589 Genetic susceptibility to other disease: Secondary | ICD-10-CM | POA: Diagnosis not present

## 2016-11-26 DIAGNOSIS — M0600 Rheumatoid arthritis without rheumatoid factor, unspecified site: Secondary | ICD-10-CM | POA: Diagnosis not present

## 2016-11-26 DIAGNOSIS — R5383 Other fatigue: Secondary | ICD-10-CM | POA: Diagnosis not present

## 2016-11-26 DIAGNOSIS — M150 Primary generalized (osteo)arthritis: Secondary | ICD-10-CM | POA: Diagnosis not present

## 2016-11-26 DIAGNOSIS — Z6821 Body mass index (BMI) 21.0-21.9, adult: Secondary | ICD-10-CM | POA: Diagnosis not present

## 2016-11-26 DIAGNOSIS — M2550 Pain in unspecified joint: Secondary | ICD-10-CM | POA: Diagnosis not present

## 2016-11-26 DIAGNOSIS — I7300 Raynaud's syndrome without gangrene: Secondary | ICD-10-CM | POA: Diagnosis not present

## 2016-11-26 MED FILL — predniSONE 5 MG TABS: 5 | 12 days supply | Qty: 48 | Fill #0

## 2016-11-28 MED FILL — lamoTRIgine 200 MG TABS: 200 | 30 days supply | Qty: 30 | Fill #0

## 2016-11-28 MED FILL — FLUoxetine HCL 10 MG CAPS: 10 | 30 days supply | Qty: 90 | Fill #0

## 2016-11-28 MED FILL — ALPRAZolam 0.5 MG TABS: 0.5 | 22 days supply | Qty: 45 | Fill #0

## 2016-12-05 ENCOUNTER — Encounter: Payer: 59 | Admitting: Family Medicine

## 2016-12-18 ENCOUNTER — Telehealth: Payer: Self-pay

## 2016-12-18 NOTE — Telephone Encounter (Signed)
  Sheena, Please check with neurology office - let them know symptoms/concerns and see if they can provide recs or if she can bee seen sooner with someone else? Thanks.

## 2016-12-18 NOTE — Telephone Encounter (Signed)
Patient called to report that her memory issues seem to be increasing. She is now forgetting steps to processes that she has done for years. She continues to have blurry vision. She denies focal headache, numbness/tingling to face or extremities, slurred speech or any loss of strength/motor function. She states that her co-workers are now asking questions about her memory issues as they have begun to notice her forgetfulness. She has called Dr. Amparo Bristol office and was told there are no sooner appts available. She would like to know what you recommend or if there is any testing you can do for her in the immediate future.   Dr. Maudie Mercury - Please advise. Thanks!

## 2016-12-19 MED FILL — MELOXICAM 15 MG TABLET: 15 | 30 days supply | Qty: 30 | Fill #1

## 2016-12-19 NOTE — Telephone Encounter (Signed)
[facility] could not see pt any sooner.  Per Diane @ [facility] they can see pt Monday morning with Dr. Krista Blue. Spoke with pt and she agreed. Appt scheduled, records sent. Nothing further needed at this time.   Dr. Maudie Mercury - FYI. Thanks!

## 2016-12-22 ENCOUNTER — Encounter: Payer: Self-pay | Admitting: Neurology

## 2016-12-22 ENCOUNTER — Ambulatory Visit (INDEPENDENT_AMBULATORY_CARE_PROVIDER_SITE_OTHER): Payer: 59 | Admitting: Neurology

## 2016-12-22 ENCOUNTER — Encounter: Payer: Self-pay | Admitting: Psychology

## 2016-12-22 VITALS — BP 131/82 | HR 69 | Ht 63.0 in | Wt 122.5 lb

## 2016-12-22 DIAGNOSIS — F329 Major depressive disorder, single episode, unspecified: Secondary | ICD-10-CM | POA: Diagnosis not present

## 2016-12-22 DIAGNOSIS — R413 Other amnesia: Secondary | ICD-10-CM

## 2016-12-22 DIAGNOSIS — F32A Depression, unspecified: Secondary | ICD-10-CM

## 2016-12-22 HISTORY — DX: Other amnesia: R41.3

## 2016-12-22 NOTE — Progress Notes (Addendum)
PATIENT: [name] DOB: Dec 06, 1955  Chief Complaint  Patient presents with  . New Patient (Initial Visit)    Referral from Dr. Colin Benton MD, Her employer/husband/family/ notice changes in her memory changes, short term memory, work suffering,ability to find words. This has been happening over a year     HISTORICAL  [name] is a 61 years old right-handed female, seen in refer by  her memory care physician Dr. Colin Benton R, for evaluation of short-term memory loss, initial evaluation was on December 22 2016.   I reviewed and summarized her referring note she currently works as a Equities trader for [employer],  She had past medical history of hypertension, Raynaud's disease, irritable bowel syndrome, palpitation, lumbar stenosis with neurogenic claudication, had a history of lumbar L4-5 decompression and fusion surgery in 2014, which has helped her lumbar radicular symptoms, she also suffered long depression anxiety, is currently seeing psychotherapy, had recent medication adjustment, currently taking lamotrigine 200 mg daily, Prozac 400 mg daily since April 2018, she stated that her mood is under better control with current new regime.  She had 16 years of education, her paternal grandmother suffered dementia at her later 67s, her father died from CAD, depression, committed suicide, mother is at age 78, still lives independently.  She has been working at her current job for 30 years, recently she made to take mistaking one day, she supposed to do previsit before endoscopy, and get medical record for the patient, in one day, she misplaced two documentations.   In April 2018, after dog had the surgery, she forgot to take the stitches out, she has left her iron running for 1 day,    I reviewed the laboratory evaluations since  2017, B12 was normal 5 1 5, normal BMP, CBC, A1c was 6.0, normal TSH, LDL 107, cholesterol 191.  I reviewed evaluation from [facility] in 2010, she presented with numbness tingling all 4 extremity, word finding difficulties.  MRI of cervical spine showed mild degenerative disc disease, there was a linear focus of signal abnormality in the dorsal cord was a faint area of enhancement at C5 level, which has been stable compared to previous scan, interpretation was sequela of inflammatory/demyelinating process, MRI of thoracic spine was unremarkable  Did have a spinal fluid testing, total protein was 56, WBC was 2, CSF herpes PCR was negative, Varicella as PCR was negative, VDRL was negative. MS panel was sent, per patient was negative  We have personally reviewed MRI of the brain April 2017, mild supratentorium small vessel disease, no significant abnormality  REVIEW OF SYSTEMS: Full 14 system review of systems performed and notable only for chronic insomnia, numbness, memory loss, depression anxiety, joint pain, swelling, aching muscles, allergy, blurred vision, constipation   ALLERGIES: Allergies  Allergen Reactions  . Aspartame And Phenylalanine Other (See Comments)    Migraines  . Beef-Derived Products Other (See Comments)    severe cramping  . Citrus Other (See Comments)    Causes Interstitial cystitis flares    Muscle twitching and contracture   . Adhesive [Tape]     Red splotches  . Doxycycline Nausea And Vomiting  . Morphine Sulfate Nausea And Vomiting    HOME MEDICATIONS: Current Outpatient Prescriptions  Medication Sig Dispense Refill  . ALPRAZolam (XANAX XR) 0.5 MG 24 hr tablet Take 1 mg by mouth at bedtime.   0  . calcium-vitamin D (OSCAL WITH D) 500-200 MG-UNIT per tablet Take 2 tablets  by mouth every evening.    . cholecalciferol (VITAMIN D) 1000 UNITS tablet Take 1,000 Units by mouth every evening.    Marland Kitchen estradiol (VIVELLE-DOT) 0.075 MG/24HR Place 1 patch onto the skin 2 (two) times a week. Wednesday and Sunday    . FLUoxetine (PROZAC) 20 MG capsule Take 40 mg by mouth daily.   5  . fluticasone  (FLONASE) 50 MCG/ACT nasal spray Place 2 sprays into the nose daily. 16 g 5  . hydrocortisone (ANUSOL-HC) 25 MG suppository One at bedtime for 5 nights then as needed 24 suppository 1  . hydrOXYzine (ATARAX/VISTARIL) 25 MG tablet Take 25 mg by mouth.    . lamoTRIgine (LAMICTAL) 200 MG tablet   2  . Multiple Vitamin (MULTIVITAMIN WITH MINERALS) TABS Take 1 tablet by mouth every evening.    Marland Kitchen omeprazole (PRILOSEC) 20 MG capsule Take 1 capsule (20 mg total) by mouth daily. 90 capsule 3  . PAZEO 0.7 % SOLN   0  . polyvinyl alcohol (LIQUIFILM TEARS) 1.4 % ophthalmic solution Place 1-2 drops into both eyes 3 (three) times daily.    . sucralfate (CARAFATE) 1 GM/10ML suspension Take 10 mLs (1 g total) by mouth 3 (three) times daily as needed. For digestion 840 mL 5  . XIIDRA 5 % SOLN   9  . ZOMIG 5 MG tablet TAKE 1 TABLET BY MOUTH AT THE TIME OF HEADACHE, MAY REPEAT IN ONE HOUR IF HEADACHE PERSISTS 9 tablet 0  . meloxicam (MOBIC) 15 MG tablet   3   No current facility-administered medications for this visit.     PAST MEDICAL HISTORY: Past Medical History:  Diagnosis Date  . Allergy   . Anemia    after birth of child 79yrs ago  . Anxiety and depression    with insomnia  . Arthritis    uses Diclofenac gel;Rheumatoid  . Blood transfusion without reported diagnosis    x3  . Chronic back pain    ? RA, ? Lupus - reports saw rheumatologist in the past but better now, sees Dr. Trenton Gammon  . Eczema   . GERD (gastroesophageal reflux disease)    takes Omeprazole daily, with nausea  . Heart murmur   . Hemorrhoids   . History of bronchitis    winter of 2014  . History of kidney stones    passed on her own  . History of migraine    last one a yr ago and takes Zomig prn  . Hot flashes 11/10/2014  . HTN (hypertension) 03/31/2012  . Hx of echocardiogram    Echo (8/15):  EF 55-60%, no RWMA  . IBS (irritable bowel syndrome)    constipation predominant  . IBS (irritable bowel syndrome)   . Interstitial  cystitis    hx of UTI  . Numbness    both legs and related to back  . Osteopenia   . Osteoporosis    osteopenia  . Palpitations    on metoprolol in past but made BP too low  . Pneumonia    hx of;last time 54yrs ago  . PONV (postoperative nausea and vomiting)    laryngospasm-1999  . Raynaud's disease /phenomenon 10/21/2012  . Seasonal allergies    Flonase daily     PAST SURGICAL HISTORY: Past Surgical History:  Procedure Laterality Date  . ABDOMINAL HYSTERECTOMY  2007  . BACK SURGERY    . COLONOSCOPY  02/11/2013 and 12/24/04   internal and external hemorrhoids  . EXCISION MORTON'S NEUROMA Right   .  FLEXIBLE SIGMOIDOSCOPY  03/07/2008   internal and external hemorrhoids  . TONSILLECTOMY    . UPPER GASTROINTESTINAL ENDOSCOPY  10/23/2010   gastritis, irregular Z-line  . uterine ablation    . wisdom teeth extracted      FAMILY HISTORY: Family History  Problem Relation Age of Onset  . Coronary artery disease Father   . Heart attack Father   . Hypertension Father   . Depression Father   . Hyperlipidemia Father   . Diverticulosis Mother   . Hypertension Mother   . Osteoarthritis Mother   . Depression Mother   . Diabetes Paternal Uncle   . Hypertension Maternal Grandmother   . Stroke Maternal Grandmother   . Hypertension Maternal Grandfather   . Cancer Maternal Grandfather   . Stroke Paternal Grandfather   . Hyperlipidemia Paternal Grandfather   . Depression Paternal Grandmother   . Dementia Paternal Grandmother   . Depression Sister   . Colon cancer Neg Hx     SOCIAL HISTORY:  Social History   Social History  . Marital status: Married    Spouse name: N/A  . Number of children: N/A  . Years of education: N/A   Occupational History  . RN [employer]   Social History Main Topics  . Smoking status: Never Smoker  . Smokeless tobacco: Never Used  . Alcohol use 0.5 oz/week    1 Standard drinks or equivalent per week     Comment: socially  . Drug use: No  .  Sexual activity: Yes   Other Topics Concern  . Not on file   Social History Narrative   Work or School: Therapist, sports at [employer] Situation: husband committed suicide      Spiritual Beliefs: methodist      Lifestyle: no regular exercise; diet is healthy           PHYSICAL EXAM   Vitals:   12/22/16 0852  BP: 131/82  Pulse: 69  Weight: 122 lb 8 oz (55.6 kg)  Height: 5\' 3"  (1.6 m)    Not recorded      Body mass index is 21.7 kg/m.  PHYSICAL EXAMNIATION:  Gen: NAD, conversant, well nourised, obese, well groomed                     Cardiovascular: Regular rate rhythm, no peripheral edema, warm, nontender. Eyes: Conjunctivae clear without exudates or hemorrhage Neck: Supple, no carotid bruits. Pulmonary: Clear to auscultation bilaterally   NEUROLOGICAL EXAM:  MENTAL STATUS: Speech:  MMSE 30/30, animal naming 8    Speech is normal; fluent and spontaneous with normal comprehension.  Cognition:     Orientation to time, place and person     Normal recent and remote memory     Normal Attention span and concentration     Normal Language, naming, repeating,spontaneous speech     Fund of knowledge   CRANIAL NERVES: CN II: Visual fields are full to confrontation. Fundoscopic exam is normal with sharp discs and no vascular changes. Pupils are round equal and briskly reactive to light. CN III, IV, VI: extraocular movement are normal. No ptosis. CN V: Facial sensation is intact to pinprick in all 3 divisions bilaterally. Corneal responses are intact.  CN VII: Face is symmetric with normal eye closure and smile. CN VIII: Hearing is normal to rubbing fingers CN IX, X: Palate elevates symmetrically. Phonation is normal. CN XI: Head turning and shoulder shrug are intact CN XII:  Tongue is midline with normal movements and no atrophy.  MOTOR: There is no pronator drift of out-stretched arms. Muscle bulk and tone are normal. Muscle strength is  normal.  REFLEXES: Reflexes are 2+ and symmetric at the biceps, triceps, knees, and ankles. Plantar responses are flexor.  SENSORY: Intact to light touch, pinprick, positional sensation and vibratory sensation are intact in fingers and toes.  COORDINATION: Rapid alternating movements and fine finger movements are intact. There is no dysmetria on finger-to-nose and heel-knee-shin.    GAIT/STANCE: Posture is normal. Gait is steady with normal steps, base, arm swing, and turning. Heel and toe walking are normal. Tandem gait is normal.  Romberg is absent.   DIAGNOSTIC DATA (LABS, IMAGING, TESTING) - I reviewed patient records, labs, notes, testing and imaging myself where available.   ASSESSMENT AND PLAN  SHEVAUN LOVAN is a 61 y.o. female   Mild cognitive impairment Depression anxiety, strong family history of depression   MRI of the brain showed no significant abnormality in 2017  Laboratory evaluation treatable etiology  Refer her to neuropsychological evaluation  Her complaints of memory loss could due to her mood disorder, need to rule out early stage of central nervous system degenerative disorder, her parternal grandmother suffered dementia   Marcial Pacas, M.D. Ph.D.  [facility] [address], [place], [place] [address] Ph: [phone] Fax: [phone]  CC: Lucretia Kern, DO

## 2016-12-23 ENCOUNTER — Other Ambulatory Visit: Payer: Self-pay | Admitting: Internal Medicine

## 2016-12-23 LAB — CBC WITH DIFFERENTIAL/PLATELET
Basophils Absolute: 0 10*3/uL (ref 0.0–0.2)
Basos: 0 %
EOS (ABSOLUTE): 0.1 10*3/uL (ref 0.0–0.4)
Eos: 2 %
Hematocrit: 44 % (ref 34.0–46.6)
Hemoglobin: 14.1 g/dL (ref 11.1–15.9)
Immature Grans (Abs): 0 10*3/uL (ref 0.0–0.1)
Immature Granulocytes: 0 %
Lymphocytes Absolute: 0.9 10*3/uL (ref 0.7–3.1)
Lymphs: 18 %
MCH: 30.3 pg (ref 26.6–33.0)
MCHC: 32 g/dL (ref 31.5–35.7)
MCV: 94 fL (ref 79–97)
Monocytes Absolute: 0.4 10*3/uL (ref 0.1–0.9)
Monocytes: 8 %
Neutrophils Absolute: 3.7 10*3/uL (ref 1.4–7.0)
Neutrophils: 72 %
Platelets: 206 10*3/uL (ref 150–379)
RBC: 4.66 x10E6/uL (ref 3.77–5.28)
RDW: 14.3 % (ref 12.3–15.4)
WBC: 5.2 10*3/uL (ref 3.4–10.8)

## 2016-12-23 LAB — COMPREHENSIVE METABOLIC PANEL
ALT: 14 IU/L (ref 0–32)
AST: 23 IU/L (ref 0–40)
Albumin/Globulin Ratio: 2 (ref 1.2–2.2)
Albumin: 4.7 g/dL (ref 3.6–4.8)
Alkaline Phosphatase: 77 IU/L (ref 39–117)
BUN/Creatinine Ratio: 18 (ref 12–28)
BUN: 17 mg/dL (ref 8–27)
Bilirubin Total: 0.3 mg/dL (ref 0.0–1.2)
CO2: 25 mmol/L (ref 20–29)
Calcium: 9.8 mg/dL (ref 8.7–10.3)
Chloride: 103 mmol/L (ref 96–106)
Creatinine, Ser: 0.95 mg/dL (ref 0.57–1.00)
GFR calc Af Amer: 75 mL/min/{1.73_m2} (ref >59)
GFR calc non Af Amer: 65 mL/min/{1.73_m2} (ref >59)
Globulin, Total: 2.3 g/dL (ref 1.5–4.5)
Glucose: 93 mg/dL (ref 65–99)
Potassium: 5.8 mmol/L — ABNORMAL HIGH (ref 3.5–5.2)
Sodium: 143 mmol/L (ref 134–144)
Total Protein: 7 g/dL (ref 6.0–8.5)

## 2016-12-23 LAB — HGB A1C W/O EAG: Hgb A1c MFr Bld: 5.5 % (ref 4.8–5.6)

## 2016-12-23 LAB — TSH: TSH: 3.28 u[IU]/mL (ref 0.450–4.500)

## 2016-12-23 LAB — VITAMIN B12: Vitamin B-12: 452 pg/mL (ref 232–1245)

## 2016-12-23 LAB — SEDIMENTATION RATE: Sed Rate: 2 mm/h (ref 0–40)

## 2016-12-23 LAB — RPR: RPR Ser Ql: NONREACTIVE

## 2016-12-23 LAB — CK: Total CK: 86 U/L (ref 24–173)

## 2016-12-23 LAB — C-REACTIVE PROTEIN: CRP: 1.4 mg/L (ref 0.0–4.9)

## 2016-12-23 LAB — FOLATE: Folate: 8.7 ng/mL (ref >3.0)

## 2016-12-23 MED FILL — CARAFATE 1 GM/10 ML SUSP: 1 | 28 days supply | Qty: 840 | Fill #0

## 2016-12-24 DIAGNOSIS — F411 Generalized anxiety disorder: Secondary | ICD-10-CM | POA: Diagnosis not present

## 2016-12-24 MED FILL — lamoTRIgine 200 MG TABS: 200 | 30 days supply | Qty: 30 | Fill #0

## 2016-12-30 DIAGNOSIS — H01022 Squamous blepharitis right lower eyelid: Secondary | ICD-10-CM | POA: Diagnosis not present

## 2016-12-30 DIAGNOSIS — H01024 Squamous blepharitis left upper eyelid: Secondary | ICD-10-CM | POA: Diagnosis not present

## 2016-12-30 DIAGNOSIS — H10503 Unspecified blepharoconjunctivitis, bilateral: Secondary | ICD-10-CM | POA: Diagnosis not present

## 2016-12-30 DIAGNOSIS — H01025 Squamous blepharitis left lower eyelid: Secondary | ICD-10-CM | POA: Diagnosis not present

## 2016-12-30 DIAGNOSIS — H04123 Dry eye syndrome of bilateral lacrimal glands: Secondary | ICD-10-CM | POA: Diagnosis not present

## 2016-12-30 DIAGNOSIS — H01021 Squamous blepharitis right upper eyelid: Secondary | ICD-10-CM | POA: Diagnosis not present

## 2017-01-09 ENCOUNTER — Ambulatory Visit: Payer: Self-pay | Admitting: Neurology

## 2017-01-22 DIAGNOSIS — F411 Generalized anxiety disorder: Secondary | ICD-10-CM | POA: Diagnosis not present

## 2017-01-23 MED FILL — FLUoxetine HCL 20 MG CAPS: 20 | 30 days supply | Qty: 90 | Fill #0

## 2017-01-23 MED FILL — ALPRAZolam 0.5 MG TABS: 0.5 | 20 days supply | Qty: 20 | Fill #0

## 2017-01-23 MED FILL — lamoTRIgine 200 MG TABS: 200 | 30 days supply | Qty: 30 | Fill #0

## 2017-01-26 MED FILL — XIIDRA 5% EYE DROPS: 5 | 30 days supply | Qty: 60 | Fill #0

## 2017-02-04 DIAGNOSIS — R768 Other specified abnormal immunological findings in serum: Secondary | ICD-10-CM | POA: Diagnosis not present

## 2017-02-04 DIAGNOSIS — M0600 Rheumatoid arthritis without rheumatoid factor, unspecified site: Secondary | ICD-10-CM | POA: Diagnosis not present

## 2017-02-04 DIAGNOSIS — Z1589 Genetic susceptibility to other disease: Secondary | ICD-10-CM | POA: Diagnosis not present

## 2017-02-04 DIAGNOSIS — M2550 Pain in unspecified joint: Secondary | ICD-10-CM | POA: Diagnosis not present

## 2017-02-04 DIAGNOSIS — M542 Cervicalgia: Secondary | ICD-10-CM | POA: Diagnosis not present

## 2017-02-04 DIAGNOSIS — R5383 Other fatigue: Secondary | ICD-10-CM | POA: Diagnosis not present

## 2017-02-04 DIAGNOSIS — Z6821 Body mass index (BMI) 21.0-21.9, adult: Secondary | ICD-10-CM | POA: Diagnosis not present

## 2017-02-04 DIAGNOSIS — I7300 Raynaud's syndrome without gangrene: Secondary | ICD-10-CM | POA: Diagnosis not present

## 2017-02-04 DIAGNOSIS — M150 Primary generalized (osteo)arthritis: Secondary | ICD-10-CM | POA: Diagnosis not present

## 2017-02-17 ENCOUNTER — Ambulatory Visit (INDEPENDENT_AMBULATORY_CARE_PROVIDER_SITE_OTHER): Payer: 59 | Admitting: Psychology

## 2017-02-17 ENCOUNTER — Encounter: Payer: Self-pay | Admitting: Psychology

## 2017-02-17 DIAGNOSIS — F329 Major depressive disorder, single episode, unspecified: Secondary | ICD-10-CM

## 2017-02-17 DIAGNOSIS — F32A Depression, unspecified: Secondary | ICD-10-CM

## 2017-02-17 DIAGNOSIS — R413 Other amnesia: Secondary | ICD-10-CM | POA: Diagnosis not present

## 2017-02-17 DIAGNOSIS — F419 Anxiety disorder, unspecified: Secondary | ICD-10-CM

## 2017-02-17 NOTE — Progress Notes (Signed)
NEUROPSYCHOLOGICAL INTERVIEW (CPT: [id number])  Name: [name] Date of Birth: 09-14-1955 Date of Interview: 02/17/2017  Reason for Referral:  [name] is a 61 y.o. right handed female who is referred for neuropsychological evaluation by Dr. Marcial Pacas of [facility] due to concerns about memory loss. This patient is unaccompanied in the office for today's appointment.   History of Presenting Problem:  [name] reported gradual onset of subtle memory difficulties at least four years ago which have become more frequent over time and tend to occur more when she is overwhelmed, anxious or tired. I see in her records that she was evaluated for memory loss "worsening over the past two years" in 2012; MRI of the brain was essentially normal. I also see she was evaluated for expressive aphasia and fatigue in 2014; Head CT on 04/28/2013 was normal. She had a brain MRI on 11/01/2015 which was reported as the following: "No acute abnormality. Mild chronic changes in the white matter and pons likely related to chronic microvascular ischemia. No change from 07/31/2010."  She notes that her memory issues have been particularly concerning at work. She is a Marine scientist at an endoscopy suite. She recently was in pre-visit and thought she had sent a doctor a message and received a response to him when in fact she had not. She went to her supervisor and relayed what had happened. Her supervisor apparently told her that she had noticed the patient was missing things that she doesn't usually miss. [name] stated she has made more errors with charting as well. Additionally, she complained of tremulousness in her hands (bilateral) which has been more recent. She notices it when she goes to turn off her alarm in the morning and some days she can hardly get IV's in due to her tremor. This is not every day. She does not feel it is due to anxiety. She reports that she has shared her cognitive concerns  with her husband but he tends to blow things off.   Upon direct questioning, the patient reported the following with regard to current cognitive functioning:   Forgetting recent conversations/events: Yes Repeating statements/questions: Yes Misplacing/losing items: Always has done this but increased recently  Forgetting appointments or other obligations: No "because I write it down" Forgetting to take medications: No Forgetting to pay bills: No  Difficulty concentrating: Yes Starting but not finishing tasks: No Distracted easily: There are days when this is an issue Processing information more slowly: Occasionally  Word-finding difficulty: Yes, very frustrating Word substitutions: Yes, catches herself using the wrong word Writing difficulty: "Never been my forte" Spelling difficulty: Yes, she finds herself avoiding writing certain words because she is afraid she will misspell it, so she uses a different word Comprehension difficulty: No problems with reading comprehension  Getting lost when driving: No Making wrong turns when driving: No Uncertain about directions when driving or passenger: No   Mrs. [name] saw Dr. Krista Blue for neurologic consultation on 12/22/2016. MMSE was 30/30. Family history is positive for dementia in one relative, her paternal grandmother who showed signs in her late 51s.  Other relevant history: The patient denied history of head injury or concussion.   Psychiatric history is significant for depression and anxiety. She sees a Management consultant. She also sees a psychiatry provider and has been put on new medication regime as of April 2018 which she feels has improved her depression. She continues to have anxiety, primarily generalized anxiety with rare panic attacks. She also  has a history of chronic insomnia. She has interrupted sleep and sometimes can't go back to sleep early in the morning when her husband leaves for work at 3:30 am. She sometimes takes Benadryl to  help her sleep and occasionally she will take a 0.25 mg Xanax to help her sleep. The patient denied suicidal ideation or intention but admitted to a history of ideation after her father committed suicide (she was in her late 73s at the time). She has no history of attempts. Of note, her ex-husband (from her second marriage) also committed suicide. She has a strong family history of depression.  Physically, the patient reported intermittent pain due to arthritis. She has had back surgery. She feels better if she keeps moving and rides her bike. She does notice she gets tired more easily but feels this might just be due to age. She reports occasional balance difficulty, no falls. Additionally, she reports that she has more recent onset of urinary frequency and urgency, no incontinence.   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  Marital history: Marriedx3. Divorced from 1st husband. 2nd husband committed suicide. Currently married, reports positive relationship. Children: 1 son and 1 daughter (ages 59, 60 respectively). 1 grandchild.  Alcohol: Socially- "every Friday night have a margarita" Tobacco: Never SA: Never   Medical History: Past Medical History:  Diagnosis Date  . Allergy   . Anemia    after birth of child 59yrs ago  . Anxiety and depression    with insomnia  . Arthritis    uses Diclofenac gel;Rheumatoid  . Blood transfusion without reported diagnosis    x3  . Chronic back pain    ? RA, ? Lupus - reports saw rheumatologist in the past but better now, sees Dr. Trenton Gammon  . Eczema   . GERD (gastroesophageal reflux disease)    takes Omeprazole daily, with nausea  . Heart murmur   . Hemorrhoids   . History of bronchitis    winter of 2014  . History of kidney stones    passed on her own  . History of migraine    last one a yr ago and takes Zomig prn  . Hot flashes 11/10/2014  . HTN (hypertension)  03/31/2012  . Hx of echocardiogram    Echo (8/15):  EF 55-60%, no RWMA  . IBS (irritable bowel syndrome)    constipation predominant  . IBS (irritable bowel syndrome)   . Interstitial cystitis    hx of UTI  . Numbness    both legs and related to back  . Osteopenia   . Osteoporosis    osteopenia  . Palpitations    on metoprolol in past but made BP too low  . Pneumonia    hx of;last time 30yrs ago  . PONV (postoperative nausea and vomiting)    laryngospasm-1999  . Raynaud's disease /phenomenon 10/21/2012  . Seasonal allergies    Flonase daily      Current Medications:  Outpatient Encounter Prescriptions as of 02/17/2017  Medication Sig  . ALPRAZolam (XANAX XR) 0.5 MG 24 hr tablet Take 1 mg by mouth at bedtime.   . calcium-vitamin D (OSCAL WITH D) 500-200 MG-UNIT per tablet Take 2 tablets by mouth every evening.  Marland Kitchen CARAFATE 1 GM/10ML suspension TAKE 10 MLS BY MOUTH 3 TIMES DAILY AS NEEDED FOR DIGESTION  . cholecalciferol (VITAMIN D) 1000 UNITS tablet Take 1,000 Units by mouth every evening.  Marland Kitchen estradiol (VIVELLE-DOT) 0.075 MG/24HR Place  1 patch onto the skin 2 (two) times a week. Wednesday and Sunday  . FLUoxetine (PROZAC) 20 MG capsule Take 40 mg by mouth daily.   . fluticasone (FLONASE) 50 MCG/ACT nasal spray Place 2 sprays into the nose daily.  . hydrocortisone (ANUSOL-HC) 25 MG suppository One at bedtime for 5 nights then as needed  . hydrOXYzine (ATARAX/VISTARIL) 25 MG tablet Take 25 mg by mouth.  . lamoTRIgine (LAMICTAL) 200 MG tablet   . meloxicam (MOBIC) 15 MG tablet   . Multiple Vitamin (MULTIVITAMIN WITH MINERALS) TABS Take 1 tablet by mouth every evening.  Marland Kitchen omeprazole (PRILOSEC) 20 MG capsule Take 1 capsule (20 mg total) by mouth daily.  Marland Kitchen PAZEO 0.7 % SOLN   . polyvinyl alcohol (LIQUIFILM TEARS) 1.4 % ophthalmic solution Place 1-2 drops into both eyes 3 (three) times daily.  Marland Kitchen XIIDRA 5 % SOLN   . ZOMIG 5 MG tablet TAKE 1 TABLET BY MOUTH AT THE TIME OF HEADACHE, MAY  REPEAT IN ONE HOUR IF HEADACHE PERSISTS   No facility-administered encounter medications on file as of 02/17/2017.      Behavioral Observations:   Appearance: Neatly, casually and appropriately dressed and groomed Gait: Ambulated independently, no gross abnormalities observed Speech: Fluent; normal rate, rhythm and volume. No significant word finding difficulty observed during conversational speech. Thought process: Linear, goal directed Affect: Full, mildly anxious Interpersonal: Pleasant, appropriate   TESTING: There is medical necessity to proceed with neuropsychological assessment as the results will be used to aid in differential diagnosis and clinical decision-making and to inform specific treatment recommendations. Per the patient and medical records reviewed, there has been a change in cognitive functioning and a need for objective testing of subjective complaints in order to determine neurologic (eg neurodegenerative dementia) versus psychiatric (eg depression/anxiety) etiology of symptoms.   PLAN: The patient will return for a full battery of neuropsychological testing with a psychometrician under my supervision. Education regarding testing procedures was provided. Subsequently, the patient will see this provider for a follow-up session at which time her test performances and my impressions and treatment recommendations will be reviewed in detail.  Full neuropsychological evaluation report to follow.

## 2017-02-25 ENCOUNTER — Ambulatory Visit (HOSPITAL_BASED_OUTPATIENT_CLINIC_OR_DEPARTMENT_OTHER): Payer: Self-pay | Admitting: Pharmacist

## 2017-02-25 DIAGNOSIS — Z79899 Other long term (current) drug therapy: Secondary | ICD-10-CM

## 2017-02-25 MED ORDER — ADALIMUMAB 40 MG/0.8ML ~~LOC~~ AJKT: 40.0000 mg | AUTO-INJECTOR | SUBCUTANEOUS | 2 refills | Status: DC

## 2017-02-25 MED ORDER — ADALIMUMAB 40 MG/0.8ML ~~LOC~~ PSKT: 0.8000 mL | PREFILLED_SYRINGE | SUBCUTANEOUS | 2 refills | Status: DC

## 2017-02-25 MED FILL — HUMIRA PEN 40 MG/0.8ML PNKT: 40 | 28 days supply | Qty: 2 | Fill #0

## 2017-02-25 NOTE — Progress Notes (Signed)
   S: Patient presents to [facility] for review of their specialty medication therapy.  Patient is currently prescribed Humira for rheumatoid arthritis. Patient is managed by Dr. Trudie Reed for this.   She reports that she was on Enbrel in the past but stopped it because she didn't feel like she needed it anymore. She was on methotrexate as well. She reports join pain and fatigue and is worried RA will impact her ability to function at work so she understands that Humira may help with that.   Adherence: she has not started it yet  Efficacy: she has not started it yet  Dosing:  Rheumatoid arthritis: SubQ: 40 mg every other week (may continue methotrexate, other nonbiologic DMARDS, corticosteroids, NSAIDs, and/or analgesics); patients not taking concomitant methotrexate may increase dose to 40 mg every week  Dose adjustments: Renal: no dose adjustments (has not been studied) Hepatic: no dose adjustments (has not been studied)  Drug-drug interactions: none  Screening: TB test: completed per patient Hepatitis: completed per patient  Monitoring: S/sx of infection: denies CBC: done June 2018 S/sx of hypersensitivity: n/a S/sx of malignancy:denies S/sx of heart failure: denies  O:     Lab Results  Component Value Date   WBC 5.2 12/22/2016   HGB 14.1 12/22/2016   HCT 44.0 12/22/2016   MCV 94 12/22/2016   PLT 206 12/22/2016      Chemistry      Component Value Date/Time   NA 143 12/22/2016 0948   K 5.8 (H) 12/22/2016 0948   CL 103 12/22/2016 0948   CO2 25 12/22/2016 0948   BUN 17 12/22/2016 0948   CREATININE 0.95 12/22/2016 0948      Component Value Date/Time   CALCIUM 9.8 12/22/2016 0948   ALKPHOS 77 12/22/2016 0948   AST 23 12/22/2016 0948   ALT 14 12/22/2016 0948   BILITOT 0.3 12/22/2016 0948       A/P: 1. Medication review: Patient currently prescribed Humira for rheumatoid arthritis but she has not started it yet. Reviewed the  medication with the patient, including the following: Humira is a TNF blocking agent indicated for ankylosing spondylitis, Crohn's disease, Hidradenitis suppurativa, psoriatic arthritis, plaque psoriasis, ulcerative colitis, and uveitis. The most common adverse effects are infections, headache, and injection site reactions. There is the possibility of an increased risk of malignancy but it is not well understood if this increased risk is due to there medication or the disease state. There are rare cases of pancytopenia and aplastic anemia. No recommendations for any changes at this time.  Christella Hartigan, PharmD, BCPS, BCACP, [facility] [phone]

## 2017-03-04 MED FILL — ALPRAZolam 0.5 MG TABS: 0.5 | 22 days supply | Qty: 45 | Fill #1

## 2017-03-04 MED FILL — lamoTRIgine 200 MG TABS: 200 | 30 days supply | Qty: 30 | Fill #1

## 2017-03-04 MED FILL — MINIVELLE 0.075 MG PATCH: 0.075 | 84 days supply | Qty: 24 | Fill #2

## 2017-03-05 ENCOUNTER — Encounter: Payer: 59 | Admitting: Family Medicine

## 2017-03-10 MED FILL — FLUoxetine HCL 40 MG CAPS: 40 | 30 days supply | Qty: 30 | Fill #0

## 2017-03-11 DIAGNOSIS — F411 Generalized anxiety disorder: Secondary | ICD-10-CM | POA: Diagnosis not present

## 2017-03-18 ENCOUNTER — Ambulatory Visit (INDEPENDENT_AMBULATORY_CARE_PROVIDER_SITE_OTHER): Payer: 59 | Admitting: Psychology

## 2017-03-18 DIAGNOSIS — R413 Other amnesia: Secondary | ICD-10-CM | POA: Diagnosis not present

## 2017-03-18 NOTE — Progress Notes (Signed)
   Neuropsychology Note  [name] returned today for 3 hours of neuropsychological testing with technician, Milana Kidney, BS, under the supervision of Dr. Macarthur Critchley. The patient did not appear overtly distressed by the testing session, per behavioral observation or via self-report to the technician. Rest breaks were offered. [name] will return within 2 weeks for a feedback session with Dr. Si Raider at which time her test performances, clinical impressions and treatment recommendations will be reviewed in detail. The patient understands she can contact our office should she require our assistance before this time.  Full report to follow.

## 2017-03-20 ENCOUNTER — Other Ambulatory Visit: Payer: Self-pay | Admitting: Obstetrics & Gynecology

## 2017-03-20 DIAGNOSIS — Z1231 Encounter for screening mammogram for malignant neoplasm of breast: Secondary | ICD-10-CM

## 2017-03-25 ENCOUNTER — Ambulatory Visit: Payer: 59 | Admitting: Neurology

## 2017-03-25 NOTE — Progress Notes (Signed)
NEUROPSYCHOLOGICAL EVALUATION   Name:    [name]  Date of Birth:   09-16-1955 Date of Interview:  02/17/2017 Date of Testing:  03/18/2017   Date of Feedback:  03/26/2017       Background Information:  Reason for Referral:  [name] is a 61 y.o. right handed female referred by Dr. Marcial Pacas of [facility] to assess her current level of cognitive functioning and assist in differential diagnosis. The current evaluation consisted of a review of available medical records, an interview with the patient, and the completion of a neuropsychological testing battery. Informed consent was obtained.  History of Presenting Problem:  [name] reported gradual onset of subtle memory difficulties at least four years ago which have become more frequent over time and tend to occur more when she is overwhelmed, anxious or tired. I see in her records that she was evaluated for memory loss "worsening over the past two years" in 2012; MRI of the brain was essentially normal. I also see she was evaluated for expressive aphasia and fatigue in 2014; Head CT on 04/28/2013 was normal. She had a brain MRI on 11/01/2015 which was reported as the following: "No acute abnormality. Mild chronic changes in the white matter and pons likely related to chronic microvascular ischemia. No change from 07/31/2010."  She notes that her memory issues have been particularly concerning at work. She is a Marine scientist at an endoscopy suite. She recently was in pre-visit and thought she had sent a doctor a message and received a response to him when in fact she had not. She went to her supervisor and relayed what had happened. Her supervisor apparently told her that she had noticed the patient was missing things that she doesn't usually miss. [name] stated she has made more errors with charting as well. Additionally, she complained of tremulousness in her hands (bilateral) which has been more recent. She notices it when she goes to turn  off her alarm in the morning and some days she can hardly get IV's in due to her tremor. This is not every day. She does not feel it is due to anxiety. She reports that she has shared her cognitive concerns with her husband but he tends to blow things off.   Upon direct questioning, the patient reported the following with regard to current cognitive functioning:   Forgetting recent conversations/events: Yes Repeating statements/questions: Yes Misplacing/losing items: Always has done this but increased recently  Forgetting appointments or other obligations: No "because I write it down" Forgetting to take medications: No Forgetting to pay bills: No  Difficulty concentrating: Yes Starting but not finishing tasks: No Distracted easily: There are days when this is an issue Processing information more slowly: Occasionally  Word-finding difficulty: Yes, very frustrating Word substitutions: Yes, catches herself using the wrong word Writing difficulty: "Never been my forte" Spelling difficulty: Yes, she finds herself avoiding writing certain words because she is afraid she will misspell it, so she uses a different word Comprehension difficulty: No problems with reading comprehension  Getting lost when driving: No Making wrong turns when driving: No Uncertain about directions when driving or passenger: No   [name] saw Dr. Krista Blue for neurologic consultation on 12/22/2016. MMSE was 30/30. Family history is positive for dementia in one relative, her paternal grandmother who showed signs in her late 77s.  Other relevant history: The patient denied history of head injury or concussion.   Psychiatric history is significant for depression and  anxiety. She sees a Management consultant. She also sees a psychiatry provider and has been put on new medication regime as of April 2018 which she feels has improved her depression. She continues to have anxiety, primarily generalized anxiety with rare panic  attacks. She also has a history of chronic insomnia. She has interrupted sleep and sometimes can't go back to sleep early in the morning when her husband leaves for work at 3:30 am. She sometimes takes Benadryl to help her sleep and occasionally she will take a 0.25 mg Xanax to help her sleep. The patient denied suicidal ideation or intention but admitted to a history of ideation after her father committed suicide (she was in her late 24s at the time). She has no history of attempts. Of note, her ex-husband (from her second marriage) also committed suicide. She has a strong family history of depression.  Physically, the patient reported intermittent pain due to arthritis. She has had back surgery. She feels better if she keeps moving and rides her bike. She does notice she gets tired more easily but feels this might just be due to age. She reports occasional balance difficulty, no falls. Additionally, she reports that she has more recent onset of urinary frequency and urgency, no incontinence.   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  Marital history: Marriedx3. Divorced from 1st husband. 2nd husband committed suicide. Currently married, reports positive relationship. Children: 1 son and 1 daughter (ages 82, 4 respectively). 1 grandchild.  Alcohol: Socially- "every Friday night have a margarita" Tobacco: Never SA: Never   Medical History:  Past Medical History:  Diagnosis Date  . Allergy   . Anemia    after birth of child 64yrs ago  . Anxiety and depression    with insomnia  . Arthritis    uses Diclofenac gel;Rheumatoid  . Blood transfusion without reported diagnosis    x3  . Chronic back pain    ? RA, ? Lupus - reports saw rheumatologist in the past but better now, sees Dr. Trenton Gammon  . Eczema   . GERD (gastroesophageal reflux disease)    takes Omeprazole daily, with nausea  . Heart murmur   . Hemorrhoids     . History of bronchitis    winter of 2014  . History of kidney stones    passed on her own  . History of migraine    last one a yr ago and takes Zomig prn  . Hot flashes 11/10/2014  . HTN (hypertension) 03/31/2012  . Hx of echocardiogram    Echo (8/15):  EF 55-60%, no RWMA  . IBS (irritable bowel syndrome)    constipation predominant  . IBS (irritable bowel syndrome)   . Interstitial cystitis    hx of UTI  . Numbness    both legs and related to back  . Osteopenia   . Osteoporosis    osteopenia  . Palpitations    on metoprolol in past but made BP too low  . Pneumonia    hx of;last time 19yrs ago  . PONV (postoperative nausea and vomiting)    laryngospasm-1999  . Raynaud's disease /phenomenon 10/21/2012  . Seasonal allergies    Flonase daily     Current medications:  Outpatient Encounter Prescriptions as of 03/26/2017  Medication Sig  . Adalimumab (HUMIRA PEN) 40 MG/0.8ML PNKT Inject 40 mg into the skin every 14 (fourteen) days.  . ALPRAZolam (XANAX XR) 0.5 MG 24 hr tablet Take 1 mg  by mouth at bedtime.   . calcium-vitamin D (OSCAL WITH D) 500-200 MG-UNIT per tablet Take 2 tablets by mouth every evening.  Marland Kitchen CARAFATE 1 GM/10ML suspension TAKE 10 MLS BY MOUTH 3 TIMES DAILY AS NEEDED FOR DIGESTION  . cholecalciferol (VITAMIN D) 1000 UNITS tablet Take 1,000 Units by mouth every evening.  Marland Kitchen estradiol (VIVELLE-DOT) 0.075 MG/24HR Place 1 patch onto the skin 2 (two) times a week. Wednesday and Sunday  . FLUoxetine (PROZAC) 20 MG capsule Take 40 mg by mouth daily.   . fluticasone (FLONASE) 50 MCG/ACT nasal spray Place 2 sprays into the nose daily.  . hydrocortisone (ANUSOL-HC) 25 MG suppository One at bedtime for 5 nights then as needed  . hydrOXYzine (ATARAX/VISTARIL) 25 MG tablet Take 25 mg by mouth.  . lamoTRIgine (LAMICTAL) 200 MG tablet   . meloxicam (MOBIC) 15 MG tablet   . Multiple Vitamin (MULTIVITAMIN WITH MINERALS) TABS Take 1 tablet by mouth every evening.  Marland Kitchen omeprazole  (PRILOSEC) 20 MG capsule Take 1 capsule (20 mg total) by mouth daily.  Marland Kitchen PAZEO 0.7 % SOLN   . polyvinyl alcohol (LIQUIFILM TEARS) 1.4 % ophthalmic solution Place 1-2 drops into both eyes 3 (three) times daily.  Marland Kitchen XIIDRA 5 % SOLN   . ZOMIG 5 MG tablet TAKE 1 TABLET BY MOUTH AT THE TIME OF HEADACHE, MAY REPEAT IN ONE HOUR IF HEADACHE PERSISTS   No facility-administered encounter medications on file as of 03/26/2017.      Current Examination:  Behavioral Observations:  Appearance: Neatly, casually and appropriately dressed and groomed Gait: Ambulated independently, no gross abnormalities observed Speech: Fluent; normal rate, rhythm and volume. No significant word finding difficulty observed during conversational speech. Thought process: Linear, goal directed Affect: Full, mildly anxious Interpersonal: Pleasant, appropriate Orientation: Oriented to all spheres. Accurately named the current President and his predecessor.   Tests Administered: . Test of Premorbid Functioning (TOPF) . Wechsler Adult Intelligence Scale-Fourth Edition (WAIS-IV): Similarities, Block Design, Matrix Reasoning, Arithmetic, Symbol Search, Coding and Digit Span subtests . Wechsler Memory Scale-Fourth Edition (WMS-IV) Adult Version (ages 53-69): Logical Memory I, II and Recognition subtests  . [place] Verbal Learning Test - 2nd Edition (CVLT-2) Standard Form . [employer] (WCST) . Repeatable Battery for the Assessment of Neuropsychological Status (RBANS) Form A:  Figure Copy and Figure Recall Subtests and Semantic Fluency subtest . Neuropsychological Assessment Battery (NAB) Language Module, Form 1: Naming subtest . Controlled Oral Word Association Test (COWAT) . Trail Making Test A and B . Boston Diagnostic Aphasia Examination (BDAE): Complex Ideational Material Subtest . Clock Drawing Test . Beck Depression Inventory - Second edition (BDI-II) . Generalized Anxiety Disorder -7 item screener  (GAD-7) . Personality Assessment Inventory (PAI)  Test Results: Note: Standardized scores are presented only for use by appropriately trained professionals and to allow for any future test-retest comparison. These scores should not be interpreted without consideration of all the information that is contained in the rest of the report. The most recent standardization samples from the test publisher or other sources were used whenever possible to derive standard scores; scores were corrected for age, gender, ethnicity and education when available.   Test Scores:  Test Name Raw Score Standardized Score Descriptor  TOPF 57/70 SS= 114 High average  WAIS-IV Subtests     Similarities 25/36 ss= 10 Average  Block Design 24/66 ss= 7 Low average  Matrix Reasoning 10/26 ss= 7 Low average  Arithmetic 10/22 ss= 7 Low average  Symbol Search 24/60 ss=  8 Low end of average  Coding 55/135 ss= 9 Average  Digit Span 24/48 ss= 9 Average  WAIS-IV Index Scores     Working Memory  SS= 89 Low average  Processing Speed  SS= 92 Average  WMS-IV Subtests     LM I 23/50 ss= 9 Average  LM II 14/50 ss= 7 Low average  LM II Recognition 28/30 Cum %: >75 Above average  CVLT-II Scores     Trial 1 3/16 Z= -2 Impaired  Trial 5 12/16 Z= 0 Average  Trials 1-5 total 40/80 T= 44 Average  SD Free Recall 8/16 Z= -0.5 Average  SD Cued Recall 12/16 Z= 0 Average  LD Free Recall 12/16 Z= 0.5 Average  LD Cued Recall 12/16 Z= 0 Average  Recognition Discriminability 15/16 hits; 2 false positives Z= 0 Average  Forced Choice Recognition 16/16  WNL  WCST     Total Errors 14 T= 52 Average  Perseverative Responses 4 T= 59 High average  Perseverative Errors 4 T= 61 High average  Conceptual Level Responses 46 T= 48 Average  Categories Completed 4 >16% WNL  Trials to Complete 1st Category 12 >16% WNL  Failure to Maintain Set 0  WNL  RBANS Subtests     Figure Copy 18/20 Z= -0.1 Average  Figure Recall 7/20 Z= -1.7 Borderline    Semantic Fluency 19/40 Z= -0.4 Average  NAB Language Naming 31/31 T= 55 Average  COWAT-FAS 46 T= 53 Average  COWAT-Animals 16 T= 45 Average  Trail Making Test A  21" 0 errors T= 62 High average  Trail Making Test B  141" 3 errors T= 34 Borderline  BDAE Subtest     Complex Ideational Material 11/12    Clock Drawing Test   WNL  BDI-II 10/63  WNL  GAD-7 7/21  Mild  PAI  No elevated clinical scales to report      Description of Test Results:  Premorbid verbal intellectual abilities were estimated to have been within the high average range based on a test of word reading. Psychomotor processing speed was average. Auditory attention and working memory were low average. Visual-spatial construction was low average to average. Language abilities were intact. Specifically, confrontation naming was average, and semantic verbal fluency was average. Auditory comprehension of complex ideational material was within normal limits. With regard to verbal memory, encoding and acquisition of non-contextual information (i.e., word list) was average across five learning trials. She demonstrated impaired immediate recall on the first learning trial but improved quickly to the average range on the second trial. After a brief interference task, free recall was average (8/16 items recalled). She recalled 4 additional items with semantic cueing (average). After a delay, free recall was average (12/16 items recalled). She did not recall any additional items with semantic cueing (average). Performance on a yes/no recognition task was average. On another verbal memory test, encoding and acquisition of contextual auditory information (i.e., short stories) was average. After a delay, free recall was low average. Performance on a yes/no recognition task was above average. With regard to non-verbal memory, delayed free recall of visual information was borderline. Executive functioning was intact overall. Mental flexibility and  set-shifting were borderline impaired on Trails B; she committed three set loss errors on this task. Verbal fluency with phonemic search restrictions was average. Verbal abstract reasoning was average. Non-verbal abstract reasoning was low average. Deductive reasoning was normal. Performance on a clock drawing task was normal.   On a self-report measure of mood, the  patient's responses were not indicative of clinically significant depression at the present time. On a self-report measure of anxiety, the patient endorsed mild generalized anxiety at the present time, characterized by excessive worry, inability to control worrying, nervousness, difficulty relaxing and irritability.  On a more extensive measure of psychopathology and personality functioning (PAI), her clinical profile reveals no marked elevations that should be considered to indicate the presence of clinical psychopathology.  Scores on one or more scales do, however, show moderate elevations that may reflect sources of difficulty for the person.  These potential problem areas are described below. The patient appears to manifest overt physical signs of tension and stress, such as sweaty palms, trembling hands, complaints of irregular heartbeats, and shortness of breath, although she might not recognize these symptoms as signs of anxiety and stress or may be repressing the experience of anxiety to some extent. According to the patient's self-report, she describes NO significant problems in the following areas: unusual thoughts or peculiar experiences; antisocial behavior; problems with empathy; undue suspiciousness or hostility; extreme moodiness and impulsivity; unhappiness and depression; unusually elevated mood or heightened activity; problematic behaviors used to manage anxiety; difficulties with health or physical functioning.  With respect to suicidal ideation, the patient is NOT reporting distress from thoughts of self-harm.   Clinical  Impressions: Generalized anxiety disorder. Results of cognitive testing were largely within normal limits. I do not see evidence of a neurocognitive disorder or underlying dementia.  The patient has a history of depression, psychological trauma (suicide of father and of previous husband), and anxiety. She reports that her current psychopharmacological treatment and psychotherapy have improved depression markedly. Currently, her clinical presentation and responses on self-report measures indicate significant ongoing anxiety. It is most likely that the patient's subjective cognitive complaints are due to anxiety.     Recommendations/Plan: Based on the findings of the present evaluation, the following recommendations are offered:  1. Continue mental health treatment with psychiatry and psychology providers. She may also benefit from stress reduction techniques such as meditation, yoga, deep breathing, etc.  2. The patient was reassured that there is no sign of underlying dementia, and no basis for diagnosis of a neurocognitive disorder. She was educated on the impact of anxiety on daily cognitive functioning and perception of daily cognitive functioning.  3. These results will provide a nice baseline for future comparison should a change in cognitive functioning be reported or observed at any point.   Feedback to Patient: JANUARY BERGTHOLD returned for a feedback appointment on 03/26/2017 to review the results of her neuropsychological evaluation with this provider. 20 minutes face-to-face time was spent reviewing her test results, my impressions and my recommendations as detailed above.    Total time spent on this patient's case: [id number] unit for interview with psychologist; [phone] units of testing by psychometrician under psychologist's supervision; [phone] units for medical record review, scoring of neuropsychological tests, interpretation of test results, preparation of this report, and review of  results to the patient by psychologist.      Thank you for your referral of [facility]. Please feel free to contact me if you have any questions or concerns regarding this report.

## 2017-03-26 ENCOUNTER — Encounter: Payer: Self-pay | Admitting: Psychology

## 2017-03-26 ENCOUNTER — Ambulatory Visit (INDEPENDENT_AMBULATORY_CARE_PROVIDER_SITE_OTHER): Payer: 59 | Admitting: Psychology

## 2017-03-26 DIAGNOSIS — R413 Other amnesia: Secondary | ICD-10-CM | POA: Diagnosis not present

## 2017-03-26 NOTE — Patient Instructions (Signed)
How STRESS and ANXIETY affect your ability to pay attention  . In times of stress, it is common to have difficulty concentrating, paying attention and making decisions . Various anxiety disorders also interfere with attention and concentration . Problems with attention and concentration can disrupt the process of learning and making new memories, which can make it seem like there is a problem with your memory. In your daily life, you may experience this disruption as forgetting names and appointments, misplacing items, and needing to make lists for shopping and errands  . Regardless of the test scores on neuropsychological evaluation, psychosocial stress can interfere with the ability to make use of your cognitive resources and function optimally across settings such as work or school, maintaining the home and responsibilities, and personal relationships  . Assessment and monitoring of symptoms o A thorough psychological evaluation is important in order to assess current symptoms and determine the best treatment o The assessment provides a starting point to monitor symptom improvement  . Treatment o Psychotherapy: Individual and group psychotherapy have been shown to be effective in improving the symptoms of depression and anxiety disorders, and typically cognitive difficulties improve with symptom reduction o Mindfulness based interventions have been shown to be effective in improving stress management o Behavioral strategies for improving attention and concentration in your daily life may be helpful. (See information below.)  . Pleasurable activities, exercise, daily structure o People who have high stress levels often stop engaging in activities that they enjoy, which can have an additional negative effect on their mood. It is recommended that all patients set aside time in their schedule to engage in pleasurable activities because this has been shown to help alleviate many types of symptoms.  Examples of pleasurable activities include taking a walk/spending time outdoors, reading or seeing a movie, spending time with friends and/or family, listening to music, and volunteering o In addition to the numerous physical health benefits of exercise, there are many psychological and cognitive benefits as well.  o Maintaining a regular schedule is also recommended, such as setting regular sleep/wake times and scheduling activities such as social outings      Tension reduction techniques   Tension is related to anxiety, which can lead to cognitive difficulties. Use of tension reduction techniques can break this cycle by decreasing tension and anxiety  Abdominal Breathing = breathing from the depths of your abdomen  . Inhale through your nose, slowly, until the breath reaches your abdomen, to a count of 5 . Release, exhaling from your nose or mouth to a count of 10 . You can place your hand on your stomach, and if you're doing this correctly, your hand will actually rise and fall with each breath . Try to concentrate on your breathing, and clear your mind of anything else if you can . Keep this up for 3-5 minutes, and do it as often as you can throughout the day, and in bed, before you go to sleep  Grounding Techniques = directing attention to something in your environment  . Find a quiet room where you can be alone . Observe and describe objects in the room, by their physical properties  . Do not attach your opinions to the objects . You can also incorporate abdominal breathing  Visualization = mental relaxation that generates a peaceful state of being  . Imagine that you are in a different, peaceful place . Enrich the visualization by imagining accompanying sensations (sights, smells, sounds, textures, temperatures)  . The visualization  process consists of 3 steps:  o Preliminary relaxation (abdominal breathing for about 5 minutes) o The visualization exercise (two examples of  scripts are provided below) o Returning to an alert state of mind  . Can be done along with Progressive Muscle Relaxation or alone

## 2017-04-02 ENCOUNTER — Encounter: Payer: Self-pay | Admitting: Family Medicine

## 2017-04-08 MED FILL — lamoTRIgine 200 MG TABS: 200 | 30 days supply | Qty: 30 | Fill #1

## 2017-04-08 MED FILL — FLUoxetine HCL 40 MG CAPS: 40 | 30 days supply | Qty: 30 | Fill #0

## 2017-04-08 NOTE — Progress Notes (Signed)
HPI:  Here for CPE:  -Concerns and/or follow up today: Sees Dr. Nori Riis for gyn, breast and bone health. Sees Dermatologist for skin checks  HTN/Palpitations: -has seen cardiologist for evaluation -on metoprolol in the past for palpitations, made BP too low -Denies: CP, sob, doe, swelling  IBS/GERD: -sees Dr. Arelia Longest for this -denies: blood in stool, weight loss  Depression and Anxiety: -sees psychiatry for management -extensive evaluation for memory concerns in 2018, slew of labs, neuropsych testing and neurology evaluation pointed to anxiety as cause -meds include: xanax, prozac, vistaril, lamictal - prior husband committed suicide -struggles with sleep -reports doing much better  Hx chronic back pain: -managed by Dr. Trenton Gammon -doing much better since surgery in 2014  -uses valium rarely for spasm  Hx migraines: -very rare now -zomig in the past  Hot Flashes: -on HRT, whenever has tried to stop has bad hot flashes  Raynauds/Polyarthralgia: -sees Dr. Trudie Reed at [facility] rheum -started humira in 2018  -Diet: variety of foods, balance and well rounded  -Exercise: cycling 15 miles 3 days per week - reports feels so much better when she exercises  -Taking folic acid, vitamin D or calcium: taking vit D3  -Diabetes and Dyslipidemia Screening:fasting for labs  -Vaccines: UTD, except flu - plans to do at work  -pap history: sees Dr. Nori Riis, s/p hysterectomy for fibroids/endometriosis  -FDLMP: n/a  -sexual activity: yes, female partner, no new partners  -wants STI testing (Hep C if born [birth year]-65): no  -FH breast, colon or ovarian ca: see FH Last mammogram: 04/2016 birads 1 Last colon cancer screening: 02/2013 Sees Dr. Nori Riis for bone, breast and women's healthy   -Alcohol, Tobacco, drug use: see social history  Review of Systems - no fevers, unintentional weight loss, vision loss, hearing loss, chest pain, sob, hemoptysis, melena, hematochezia, hematuria, genital  discharge, changing or concerning skin lesions, bleeding, bruising, loc, thoughts of self harm or SI  Past Medical History:  Diagnosis Date  . Allergy   . Anemia    after birth of child 72yr ago  . Anxiety and depression    with insomnia  . Arthritis    uses Diclofenac gel;Rheumatoid  . Blood transfusion without reported diagnosis    x3  . Chronic back pain    ? RA, ? Lupus - reports saw rheumatologist in the past but better now, sees Dr. PTrenton Gammon . Eczema   . GERD (gastroesophageal reflux disease)    takes Omeprazole daily, with nausea  . Heart murmur   . Hemorrhoids   . History of bronchitis    winter of 2014  . History of kidney stones    passed on her own  . History of migraine    last one a yr ago and takes Zomig prn  . Hot flashes 11/10/2014  . HTN (hypertension) 03/31/2012  . Hx of echocardiogram    Echo (8/15):  EF 55-60%, no RWMA  . IBS (irritable bowel syndrome)    constipation predominant  . IBS (irritable bowel syndrome)   . Interstitial cystitis    hx of UTI  . Numbness    both legs and related to back  . Osteopenia   . Osteoporosis    osteopenia  . Palpitations    on metoprolol in past but made BP too low  . Pneumonia    hx of;last time 89yrago  . PONV (postoperative nausea and vomiting)    laryngospasm-1999  . Raynaud's disease /phenomenon 10/21/2012  . Seasonal allergies  Flonase daily     Past Surgical History:  Procedure Laterality Date  . ABDOMINAL HYSTERECTOMY  2007  . BACK SURGERY    . COLONOSCOPY  02/11/2013 and 12/24/04   internal and external hemorrhoids  . EXCISION MORTON'S NEUROMA Right   . FLEXIBLE SIGMOIDOSCOPY  03/07/2008   internal and external hemorrhoids  . TONSILLECTOMY    . UPPER GASTROINTESTINAL ENDOSCOPY  10/23/2010   gastritis, irregular Z-line  . uterine ablation    . wisdom teeth extracted      Family History  Problem Relation Age of Onset  . Coronary artery disease Father   . Heart attack Father   . Hypertension  Father   . Depression Father   . Hyperlipidemia Father   . Diverticulosis Mother   . Hypertension Mother   . Osteoarthritis Mother   . Depression Mother   . Diabetes Paternal Uncle   . Hypertension Maternal Grandmother   . Stroke Maternal Grandmother   . Hypertension Maternal Grandfather   . Cancer Maternal Grandfather   . Stroke Paternal Grandfather   . Hyperlipidemia Paternal Grandfather   . Depression Paternal Grandmother   . Dementia Paternal Grandmother   . Depression Sister   . Colon cancer Neg Hx     Social History   Social History  . Marital status: Married    Spouse name: N/A  . Number of children: N/A  . Years of education: N/A   Occupational History  . RN [employer]   Social History Main Topics  . Smoking status: Never Smoker  . Smokeless tobacco: Never Used  . Alcohol use 1.2 oz/week    1 Standard drinks or equivalent, 1 Shots of liquor per week     Comment: one margarita a week  . Drug use: No  . Sexual activity: Yes   Other Topics Concern  . 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  Adalimumab (HUMIRA PEN) 40 MG/0.8ML PNKT, Inject 40 mg into the skin every 14 (fourteen) days., Disp: 2 each, Rfl: 2 .  ALPRAZolam (XANAX XR) 0.5 MG 24 hr tablet, Take 1 mg by mouth at bedtime. , Disp: , Rfl: 0 .  calcium-vitamin D (OSCAL WITH D) 500-200 MG-UNIT per tablet, Take 2 tablets by mouth every evening., Disp: , Rfl:  .  CARAFATE 1 GM/10ML suspension, TAKE 10 MLS BY MOUTH 3 TIMES DAILY AS NEEDED FOR DIGESTION, Disp: 840 mL, Rfl: 5 .  cholecalciferol (VITAMIN D) 1000 UNITS tablet, Take 1,000 Units by mouth every evening., Disp: , Rfl:  .  estradiol (VIVELLE-DOT) 0.075 MG/24HR, Place 1 patch onto the skin 2 (two) times a week. Wednesday and  Sunday, Disp: , Rfl:  .  FLUoxetine (PROZAC) 20 MG capsule, Take 40 mg by mouth daily. , Disp: , Rfl: 5 .  fluticasone (FLONASE) 50 MCG/ACT nasal spray, Place 2 sprays into the nose daily., Disp: 16 g, Rfl: 5 .  hydrocortisone (ANUSOL-HC) 25 MG suppository, One at bedtime for 5 nights then as needed, Disp: 24 suppository, Rfl: 1 .  lamoTRIgine (LAMICTAL) 200 MG tablet, , Disp: , Rfl: 2 .  meloxicam (MOBIC) 15 MG tablet, , Disp: , Rfl: 3 .  Multiple Vitamin (MULTIVITAMIN WITH  MINERALS) TABS, Take 1 tablet by mouth every evening., Disp: , Rfl:  .  omeprazole (PRILOSEC) 20 MG capsule, Take 1 capsule (20 mg total) by mouth daily., Disp: 90 capsule, Rfl: 3 .  XIIDRA 5 % SOLN, , Disp: , Rfl: 9 .  ZOMIG 5 MG tablet, TAKE 1 TABLET BY MOUTH AT THE TIME OF HEADACHE, MAY REPEAT IN ONE HOUR IF HEADACHE PERSISTS, Disp: 9 tablet, Rfl: 0  EXAM:  Vitals:   04/09/17 0731  BP: 110/80  Pulse: 65  Temp: 98.4 F (36.9 C)   Body mass index is 21.04 kg/m.  GENERAL: vitals reviewed and listed below, alert, oriented, appears well hydrated and in no acute distress  HEENT: head atraumatic, PERRLA, normal appearance of eyes, ears, nose and mouth. moist mucus membranes.  NECK: supple, no masses or lymphadenopathy  LUNGS: clear to auscultation bilaterally, no rales, rhonchi or wheeze  CV: HRRR, no peripheral edema or cyanosis, normal pedal pulses  ABDOMEN: bowel sounds normal, soft, non tender to palpation, no masses, no rebound or guarding  BREAST/GU: declined, does with gyn  SKIN: no rash or abnormal lesions - declined full skin exam, does with dermatologist  MS: normal gait, moves all extremities normally  NEURO: normal gait, speech and thought processing grossly intact, muscle tone grossly intact throughout  PSYCH: normal affect, pleasant and cooperative  ASSESSMENT AND PLAN:  Discussed the following assessment and plan:  Encounter for preventative adult health care examination - Plan: Lipid  panel  Essential hypertension - Plan: Basic metabolic panel, CBC  Heart palpitations  Irritable bowel syndrome with constipation  Gastroesophageal reflux disease, esophagitis presence not specified  Raynaud's disease without gangrene  Polyarthralgia - -sees Dr. Trudie Reed - [place] [facility]  Recurrent major depressive disorder, in full remission ([place])  Hyperglycemia - Plan: Hemoglobin A1c  -Discussed and advised all [place] preventive services health task force level A and B recommendations for age, sex and risks.  -Advised at least 150 minutes of exercise per week and a healthy diet.  -FASTING labs, studies and vaccines per orders this encounter  Orders Placed This Encounter  Procedures  . Basic metabolic panel  . CBC  . Lipid panel  . Hemoglobin A1c    Patient advised to return to clinic immediately if symptoms worsen or persist or new concerns.  Patient Instructions  BEFORE YOU LEAVE: -labs -follow up: 4-5 months  We have ordered labs or studies at this visit. It can take up to 1-2 weeks for results and processing. IF results require follow up or explanation, we will call you with instructions. Clinically stable results will be released to your [facility]. If you have not heard from [place] or cannot find your results in [facility] in 2 weeks please contact our office at [phone].  If you are not yet signed up for [facility], please consider signing up.   Health Maintenance for Postmenopausal Women Menopause is a normal process in which your reproductive ability comes to an end. This process happens gradually over a span of months to years, usually between the ages of 62 and 2. Menopause is complete when you have missed 12 consecutive menstrual periods. It is important to talk with your health care provider about some of the most common conditions that affect postmenopausal women, such as heart disease, cancer, and bone loss (osteoporosis). Adopting a healthy lifestyle and getting preventive  care can help to promote your health and wellness. Those actions can also lower your chances of developing some of these common conditions.  What should I know about menopause? During menopause, you may experience a number of symptoms, such as:  Moderate-to-severe hot flashes.  Night sweats.  Decrease in sex drive.  Mood swings.  Headaches.  Tiredness.  Irritability.  Memory problems.  Insomnia.  Choosing to treat or not to treat menopausal changes is an individual decision that you make with your health care provider. What should I know about hormone replacement therapy and supplements? Hormone therapy products are effective for treating symptoms that are associated with menopause, such as hot flashes and night sweats. Hormone replacement carries certain risks, especially as you become older. If you are thinking about using estrogen or estrogen with progestin treatments, discuss the benefits and risks with your health care provider. What should I know about heart disease and stroke? Heart disease, heart attack, and stroke become more likely as you age. This may be due, in part, to the hormonal changes that your body experiences during menopause. These can affect how your body processes dietary fats, triglycerides, and cholesterol. Heart attack and stroke are both medical emergencies. There are many things that you can do to help prevent heart disease and stroke:  Have your blood pressure checked at least every 1-2 years. High blood pressure causes heart disease and increases the risk of stroke.  If you are 22-28 years old, ask your health care provider if you should take aspirin to prevent a heart attack or a stroke.  Do not use any tobacco products, including cigarettes, chewing tobacco, or electronic cigarettes. If you need help quitting, ask your health care provider.  It is important to eat a healthy diet and maintain a healthy weight. ? Be sure to include plenty of  vegetables, fruits, low-fat dairy products, and lean protein. ? Avoid eating foods that are high in solid fats, added sugars, or salt (sodium).  Get regular exercise. This is one of the most important things that you can do for your health. ? Try to exercise for at least 150 minutes each week. The type of exercise that you do should increase your heart rate and make you sweat. This is known as moderate-intensity exercise. ? Try to do strengthening exercises at least twice each week. Do these in addition to the moderate-intensity exercise.  Know your numbers.Ask your health care provider to check your cholesterol and your blood glucose. Continue to have your blood tested as directed by your health care provider.  What should I know about cancer screening? There are several types of cancer. Take the following steps to reduce your risk and to catch any cancer development as early as possible. Breast Cancer  Practice breast self-awareness. ? This means understanding how your breasts normally appear and feel. ? It also means doing regular breast self-exams. Let your health care provider know about any changes, no matter how small.  If you are 2 or older, have a clinician do a breast exam (clinical breast exam or CBE) every year. Depending on your age, family history, and medical history, it may be recommended that you also have a yearly breast X-ray (mammogram).  If you have a family history of breast cancer, talk with your health care provider about genetic screening.  If you are at high risk for breast cancer, talk with your health care provider about having an MRI and a mammogram every year.  Breast cancer (BRCA) gene test is recommended for women who have family members with BRCA-related cancers. Results of the assessment will determine the need for genetic  counseling and BRCA1 and for BRCA2 testing. BRCA-related cancers include these types: ? Breast. This occurs in males or  females. ? Ovarian. ? Tubal. This may also be called fallopian tube cancer. ? Cancer of the abdominal or pelvic lining (peritoneal cancer). ? Prostate. ? Pancreatic.  Cervical, Uterine, and Ovarian Cancer Your health care provider may recommend that you be screened regularly for cancer of the pelvic organs. These include your ovaries, uterus, and vagina. This screening involves a pelvic exam, which includes checking for microscopic changes to the surface of your cervix (Pap test).  For women ages 21-65, health care providers may recommend a pelvic exam and a Pap test every three years. For women ages 76-65, they may recommend the Pap test and pelvic exam, combined with testing for human papilloma virus (HPV), every five years. Some types of HPV increase your risk of cervical cancer. Testing for HPV may also be done on women of any age who have unclear Pap test results.  Other health care providers may not recommend any screening for nonpregnant women who are considered low risk for pelvic cancer and have no symptoms. Ask your health care provider if a screening pelvic exam is right for you.  If you have had past treatment for cervical cancer or a condition that could lead to cancer, you need Pap tests and screening for cancer for at least 20 years after your treatment. If Pap tests have been discontinued for you, your risk factors (such as having a new sexual partner) need to be reassessed to determine if you should start having screenings again. Some women have medical problems that increase the chance of getting cervical cancer. In these cases, your health care provider may recommend that you have screening and Pap tests more often.  If you have a family history of uterine cancer or ovarian cancer, talk with your health care provider about genetic screening.  If you have vaginal bleeding after reaching menopause, tell your health care provider.  There are currently no reliable tests available  to screen for ovarian cancer.  Lung Cancer Lung cancer screening is recommended for adults 67-78 years old who are at high risk for lung cancer because of a history of smoking. A yearly low-dose CT scan of the lungs is recommended if you:  Currently smoke.  Have a history of at least 30 pack-years of smoking and you currently smoke or have quit within the past 15 years. A pack-year is smoking an average of one pack of cigarettes per day for one year.  Yearly screening should:  Continue until it has been 15 years since you quit.  Stop if you develop a health problem that would prevent you from having lung cancer treatment.  Colorectal Cancer  This type of cancer can be detected and can often be prevented.  Routine colorectal cancer screening usually begins at age 33 and continues through age 23.  If you have risk factors for colon cancer, your health care provider may recommend that you be screened at an earlier age.  If you have a family history of colorectal cancer, talk with your health care provider about genetic screening.  Your health care provider may also recommend using home test kits to check for hidden blood in your stool.  A small camera at the end of a tube can be used to examine your colon directly (sigmoidoscopy or colonoscopy). This is done to check for the earliest forms of colorectal cancer.  Direct examination of the colon should  be repeated every 5-10 years until age 51. However, if early forms of precancerous polyps or small growths are found or if you have a family history or genetic risk for colorectal cancer, you may need to be screened more often.  Skin Cancer  Check your skin from head to toe regularly.  Monitor any moles. Be sure to tell your health care provider: ? About any new moles or changes in moles, especially if there is a change in a mole's shape or color. ? If you have a mole that is larger than the size of a pencil eraser.  If any of your  family members has a history of skin cancer, especially at a young age, talk with your health care provider about genetic screening.  Always use sunscreen. Apply sunscreen liberally and repeatedly throughout the day.  Whenever you are outside, protect yourself by wearing long sleeves, pants, a wide-brimmed hat, and sunglasses.  What should I know about osteoporosis? Osteoporosis is a condition in which bone destruction happens more quickly than new bone creation. After menopause, you may be at an increased risk for osteoporosis. To help prevent osteoporosis or the bone fractures that can happen because of osteoporosis, the following is recommended:  If you are 56-90 years old, get at least 1,000 mg of calcium and at least 600 mg of vitamin D per day.  If you are older than age 13 but younger than age 40, get at least 1,200 mg of calcium and at least 600 mg of vitamin D per day.  If you are older than age 16, get at least 1,200 mg of calcium and at least 800 mg of vitamin D per day.  Smoking and excessive alcohol intake increase the risk of osteoporosis. Eat foods that are rich in calcium and vitamin D, and do weight-bearing exercises several times each week as directed by your health care provider. What should I know about how menopause affects my mental health? Depression may occur at any age, but it is more common as you become older. Common symptoms of depression include:  Low or sad mood.  Changes in sleep patterns.  Changes in appetite or eating patterns.  Feeling an overall lack of motivation or enjoyment of activities that you previously enjoyed.  Frequent crying spells.  Talk with your health care provider if you think that you are experiencing depression. What should I know about immunizations? It is important that you get and maintain your immunizations. These include:  Tetanus, diphtheria, and pertussis (Tdap) booster vaccine.  Influenza every year before the flu season  begins.  Pneumonia vaccine.  Shingles vaccine.  Your health care provider may also recommend other immunizations. This information is not intended to replace advice given to you by your health care provider. Make sure you discuss any questions you have with your health care provider. Document Released: 08/22/2005 Document Revised: 01/18/2016 Document Reviewed: 04/03/2015 Elsevier Interactive Patient Education  2018 [employer] NOW OFFER   [facility] FAST TRACK!!!  SAME DAY Appointments for ACUTE CARE  Such as: Sprains, Injuries, cuts, abrasions, rashes, muscle pain, joint pain, back pain Colds, flu, sore throats, headache, allergies, cough, fever  Ear pain, sinus and eye infections Abdominal pain, nausea, vomiting, diarrhea, upset stomach Animal/insect bites  3 Easy Ways to Schedule: Walk-In Scheduling Call in scheduling Mychart Sign-up: [email or web address]                No Follow-up on file.  Colin Benton R., DO

## 2017-04-09 ENCOUNTER — Encounter: Payer: Self-pay | Admitting: Family Medicine

## 2017-04-09 ENCOUNTER — Ambulatory Visit (INDEPENDENT_AMBULATORY_CARE_PROVIDER_SITE_OTHER): Payer: 59 | Admitting: Family Medicine

## 2017-04-09 VITALS — BP 110/80 | HR 65 | Temp 98.4°F | Ht 63.25 in | Wt 119.7 lb

## 2017-04-09 DIAGNOSIS — R739 Hyperglycemia, unspecified: Secondary | ICD-10-CM

## 2017-04-09 DIAGNOSIS — K581 Irritable bowel syndrome with constipation: Secondary | ICD-10-CM

## 2017-04-09 DIAGNOSIS — K219 Gastro-esophageal reflux disease without esophagitis: Secondary | ICD-10-CM

## 2017-04-09 DIAGNOSIS — Z0000 Encounter for general adult medical examination without abnormal findings: Secondary | ICD-10-CM

## 2017-04-09 DIAGNOSIS — M2550 Pain in unspecified joint: Secondary | ICD-10-CM

## 2017-04-09 DIAGNOSIS — I10 Essential (primary) hypertension: Secondary | ICD-10-CM

## 2017-04-09 DIAGNOSIS — I7300 Raynaud's syndrome without gangrene: Secondary | ICD-10-CM | POA: Diagnosis not present

## 2017-04-09 DIAGNOSIS — R002 Palpitations: Secondary | ICD-10-CM | POA: Diagnosis not present

## 2017-04-09 DIAGNOSIS — F3342 Major depressive disorder, recurrent, in full remission: Secondary | ICD-10-CM | POA: Diagnosis not present

## 2017-04-09 LAB — LIPID PANEL
Cholesterol: 175 mg/dL (ref 0–200)
HDL: 72.6 mg/dL (ref >39.00)
LDL Cholesterol: 92 mg/dL (ref 0–99)
NonHDL: 102.62
Total CHOL/HDL Ratio: 2
Triglycerides: 54 mg/dL (ref 0.0–149.0)
VLDL: 10.8 mg/dL (ref 0.0–40.0)

## 2017-04-09 LAB — CBC
HCT: 43.1 % (ref 36.0–46.0)
Hemoglobin: 13.7 g/dL (ref 12.0–15.0)
MCHC: 31.7 g/dL (ref 30.0–36.0)
MCV: 96.2 fl (ref 78.0–100.0)
Platelets: 171 10*3/uL (ref 150.0–400.0)
RBC: 4.48 Mil/uL (ref 3.87–5.11)
RDW: 14.2 % (ref 11.5–15.5)
WBC: 3.5 10*3/uL — ABNORMAL LOW (ref 4.0–10.5)

## 2017-04-09 LAB — HEMOGLOBIN A1C: Hgb A1c MFr Bld: 5.7 % (ref 4.6–6.5)

## 2017-04-09 LAB — BASIC METABOLIC PANEL
BUN: 14 mg/dL (ref 6–23)
CO2: 31 mEq/L (ref 19–32)
Calcium: 9 mg/dL (ref 8.4–10.5)
Chloride: 104 mEq/L (ref 96–112)
Creatinine, Ser: 0.91 mg/dL (ref 0.40–1.20)
GFR: 66.87 mL/min (ref >60.00)
Glucose, Bld: 71 mg/dL (ref 70–99)
Potassium: 5.3 mEq/L — ABNORMAL HIGH (ref 3.5–5.1)
Sodium: 138 mEq/L (ref 135–145)

## 2017-04-09 NOTE — Patient Instructions (Addendum)
BEFORE YOU LEAVE: -labs -follow up: 4-5 months  We have ordered labs or studies at this visit. It can take up to 1-2 weeks for results and processing. IF results require follow up or explanation, we will call you with instructions. Clinically stable results will be released to your MYCHART. If you have not heard from us or cannot find your results in MYCHART in 2 weeks please contact our office at [phone].  If you are not yet signed up for MYCHART, please consider signing up.   Health Maintenance for Postmenopausal Women Menopause is a normal process in which your reproductive ability comes to an end. This process happens gradually over a span of months to years, usually between the ages of 48 and 55. Menopause is complete when you have missed 12 consecutive menstrual periods. It is important to talk with your health care provider about some of the most common conditions that affect postmenopausal women, such as heart disease, cancer, and bone loss (osteoporosis). Adopting a healthy lifestyle and getting preventive care can help to promote your health and wellness. Those actions can also lower your chances of developing some of these common conditions. What should I know about menopause? During menopause, you may experience a number of symptoms, such as:  Moderate-to-severe hot flashes.  Night sweats.  Decrease in sex drive.  Mood swings.  Headaches.  Tiredness.  Irritability.  Memory problems.  Insomnia.  Choosing to treat or not to treat menopausal changes is an individual decision that you make with your health care provider. What should I know about hormone replacement therapy and supplements? Hormone therapy products are effective for treating symptoms that are associated with menopause, such as hot flashes and night sweats. Hormone replacement carries certain risks, especially as you become older. If you are thinking about using estrogen or estrogen with progestin  treatments, discuss the benefits and risks with your health care provider. What should I know about heart disease and stroke? Heart disease, heart attack, and stroke become more likely as you age. This may be due, in part, to the hormonal changes that your body experiences during menopause. These can affect how your body processes dietary fats, triglycerides, and cholesterol. Heart attack and stroke are both medical emergencies. There are many things that you can do to help prevent heart disease and stroke:  Have your blood pressure checked at least every 1-2 years. High blood pressure causes heart disease and increases the risk of stroke.  If you are 55-79 years old, ask your health care provider if you should take aspirin to prevent a heart attack or a stroke.  Do not use any tobacco products, including cigarettes, chewing tobacco, or electronic cigarettes. If you need help quitting, ask your health care provider.  It is important to eat a healthy diet and maintain a healthy weight. ? Be sure to include plenty of vegetables, fruits, low-fat dairy products, and lean protein. ? Avoid eating foods that are high in solid fats, added sugars, or salt (sodium).  Get regular exercise. This is one of the most important things that you can do for your health. ? Try to exercise for at least 150 minutes each week. The type of exercise that you do should increase your heart rate and make you sweat. This is known as moderate-intensity exercise. ? Try to do strengthening exercises at least twice each week. Do these in addition to the moderate-intensity exercise.  Know your numbers.Ask your health care provider to check your cholesterol and your   blood glucose. Continue to have your blood tested as directed by your health care provider.  What should I know about cancer screening? There are several types of cancer. Take the following steps to reduce your risk and to catch any cancer development as early as  possible. Breast Cancer  Practice breast self-awareness. ? This means understanding how your breasts normally appear and feel. ? It also means doing regular breast self-exams. Let your health care provider know about any changes, no matter how small.  If you are 40 or older, have a clinician do a breast exam (clinical breast exam or CBE) every year. Depending on your age, family history, and medical history, it may be recommended that you also have a yearly breast X-ray (mammogram).  If you have a family history of breast cancer, talk with your health care provider about genetic screening.  If you are at high risk for breast cancer, talk with your health care provider about having an MRI and a mammogram every year.  Breast cancer (BRCA) gene test is recommended for women who have family members with BRCA-related cancers. Results of the assessment will determine the need for genetic counseling and BRCA1 and for BRCA2 testing. BRCA-related cancers include these types: ? Breast. This occurs in males or females. ? Ovarian. ? Tubal. This may also be called fallopian tube cancer. ? Cancer of the abdominal or pelvic lining (peritoneal cancer). ? Prostate. ? Pancreatic.  Cervical, Uterine, and Ovarian Cancer Your health care provider may recommend that you be screened regularly for cancer of the pelvic organs. These include your ovaries, uterus, and vagina. This screening involves a pelvic exam, which includes checking for microscopic changes to the surface of your cervix (Pap test).  For women ages 21-65, health care providers may recommend a pelvic exam and a Pap test every three years. For women ages 30-65, they may recommend the Pap test and pelvic exam, combined with testing for human papilloma virus (HPV), every five years. Some types of HPV increase your risk of cervical cancer. Testing for HPV may also be done on women of any age who have unclear Pap test results.  Other health care  providers may not recommend any screening for nonpregnant women who are considered low risk for pelvic cancer and have no symptoms. Ask your health care provider if a screening pelvic exam is right for you.  If you have had past treatment for cervical cancer or a condition that could lead to cancer, you need Pap tests and screening for cancer for at least 20 years after your treatment. If Pap tests have been discontinued for you, your risk factors (such as having a new sexual partner) need to be reassessed to determine if you should start having screenings again. Some women have medical problems that increase the chance of getting cervical cancer. In these cases, your health care provider may recommend that you have screening and Pap tests more often.  If you have a family history of uterine cancer or ovarian cancer, talk with your health care provider about genetic screening.  If you have vaginal bleeding after reaching menopause, tell your health care provider.  There are currently no reliable tests available to screen for ovarian cancer.  Lung Cancer Lung cancer screening is recommended for adults 55-80 years old who are at high risk for lung cancer because of a history of smoking. A yearly low-dose CT scan of the lungs is recommended if you:  Currently smoke.  Have a history of   at least 30 pack-years of smoking and you currently smoke or have quit within the past 15 years. A pack-year is smoking an average of one pack of cigarettes per day for one year.  Yearly screening should:  Continue until it has been 15 years since you quit.  Stop if you develop a health problem that would prevent you from having lung cancer treatment.  Colorectal Cancer  This type of cancer can be detected and can often be prevented.  Routine colorectal cancer screening usually begins at age 51 and continues through age 3.  If you have risk factors for colon cancer, your health care provider may recommend  that you be screened at an earlier age.  If you have a family history of colorectal cancer, talk with your health care provider about genetic screening.  Your health care provider may also recommend using home test kits to check for hidden blood in your stool.  A small camera at the end of a tube can be used to examine your colon directly (sigmoidoscopy or colonoscopy). This is done to check for the earliest forms of colorectal cancer.  Direct examination of the colon should be repeated every 5-10 years until age 22. However, if early forms of precancerous polyps or small growths are found or if you have a family history or genetic risk for colorectal cancer, you may need to be screened more often.  Skin Cancer  Check your skin from head to toe regularly.  Monitor any moles. Be sure to tell your health care provider: ? About any new moles or changes in moles, especially if there is a change in a mole's shape or color. ? If you have a mole that is larger than the size of a pencil eraser.  If any of your family members has a history of skin cancer, especially at a young age, talk with your health care provider about genetic screening.  Always use sunscreen. Apply sunscreen liberally and repeatedly throughout the day.  Whenever you are outside, protect yourself by wearing long sleeves, pants, a wide-brimmed hat, and sunglasses.  What should I know about osteoporosis? Osteoporosis is a condition in which bone destruction happens more quickly than new bone creation. After menopause, you may be at an increased risk for osteoporosis. To help prevent osteoporosis or the bone fractures that can happen because of osteoporosis, the following is recommended:  If you are 64-85 years old, get at least 1,000 mg of calcium and at least 600 mg of vitamin D per day.  If you are older than age 82 but younger than age 14, get at least 1,200 mg of calcium and at least 600 mg of vitamin D per day.  If you  are older than age 78, get at least 1,200 mg of calcium and at least 800 mg of vitamin D per day.  Smoking and excessive alcohol intake increase the risk of osteoporosis. Eat foods that are rich in calcium and vitamin D, and do weight-bearing exercises several times each week as directed by your health care provider. What should I know about how menopause affects my mental health? Depression may occur at any age, but it is more common as you become older. Common symptoms of depression include:  Low or sad mood.  Changes in sleep patterns.  Changes in appetite or eating patterns.  Feeling an overall lack of motivation or enjoyment of activities that you previously enjoyed.  Frequent crying spells.  Talk with your health care provider if  you think that you are experiencing depression. What should I know about immunizations? It is important that you get and maintain your immunizations. These include:  Tetanus, diphtheria, and pertussis (Tdap) booster vaccine.  Influenza every year before the flu season begins.  Pneumonia vaccine.  Shingles vaccine.  Your health care provider may also recommend other immunizations. This information is not intended to replace advice given to you by your health care provider. Make sure you discuss any questions you have with your health care provider. Document Released: 08/22/2005 Document Revised: 01/18/2016 Document Reviewed: 04/03/2015 Elsevier Interactive Patient Education  2018 [employer]   [facility] FAST TRACK!!!  SAME DAY Appointments for ACUTE CARE  Such as: Sprains, Injuries, cuts, abrasions, rashes, muscle pain, joint pain, back pain Colds, flu, sore throats, headache, allergies, cough, fever  Ear pain, sinus and eye infections Abdominal pain, nausea, vomiting, diarrhea, upset stomach Animal/insect bites  3 Easy Ways to Schedule: Walk-In Scheduling Call in scheduling Mychart Sign-up:  [email or web address]

## 2017-04-14 MED FILL — PAZEO 0.7% EYE DROPS: 0.7 | 30 days supply | Qty: 3 | Fill #0

## 2017-04-22 ENCOUNTER — Ambulatory Visit
Admission: RE | Admit: 2017-04-22 | Discharge: 2017-04-22 | Disposition: A | Discharge status: Home / Self Care | Payer: 59 | Source: Ambulatory Visit | Attending: Obstetrics & Gynecology | Admitting: Obstetrics & Gynecology

## 2017-04-22 DIAGNOSIS — Z1231 Encounter for screening mammogram for malignant neoplasm of breast: Secondary | ICD-10-CM

## 2017-04-22 DIAGNOSIS — F411 Generalized anxiety disorder: Secondary | ICD-10-CM | POA: Diagnosis not present

## 2017-04-28 ENCOUNTER — Ambulatory Visit: Payer: 59 | Admitting: Neurology

## 2017-05-01 MED FILL — HYDROCORTISONE AC 25 MG SUP: 25 | 24 days supply | Qty: 24 | Fill #1

## 2017-05-06 DIAGNOSIS — M150 Primary generalized (osteo)arthritis: Secondary | ICD-10-CM | POA: Diagnosis not present

## 2017-05-06 DIAGNOSIS — M0600 Rheumatoid arthritis without rheumatoid factor, unspecified site: Secondary | ICD-10-CM | POA: Diagnosis not present

## 2017-05-06 DIAGNOSIS — Z1589 Genetic susceptibility to other disease: Secondary | ICD-10-CM | POA: Diagnosis not present

## 2017-05-06 DIAGNOSIS — M2550 Pain in unspecified joint: Secondary | ICD-10-CM | POA: Diagnosis not present

## 2017-05-06 DIAGNOSIS — Z79899 Other long term (current) drug therapy: Secondary | ICD-10-CM | POA: Diagnosis not present

## 2017-05-06 DIAGNOSIS — Z6821 Body mass index (BMI) 21.0-21.9, adult: Secondary | ICD-10-CM | POA: Diagnosis not present

## 2017-05-19 MED FILL — lamoTRIgine 200 MG TABS: 200 | 30 days supply | Qty: 30 | Fill #2

## 2017-05-19 MED FILL — HUMIRA PEN 40 MG/0.8ML PNKT: 40 | 28 days supply | Qty: 2 | Fill #1

## 2017-06-01 MED FILL — ALPRAZolam 0.5 MG TABS: 0.5 | 30 days supply | Qty: 20 | Fill #0

## 2017-06-01 MED FILL — XIIDRA 5% EYE DROPS: 5 | 30 days supply | Qty: 60 | Fill #1

## 2017-06-09 DIAGNOSIS — Z6821 Body mass index (BMI) 21.0-21.9, adult: Secondary | ICD-10-CM | POA: Diagnosis not present

## 2017-06-09 DIAGNOSIS — Z01419 Encounter for gynecological examination (general) (routine) without abnormal findings: Secondary | ICD-10-CM | POA: Diagnosis not present

## 2017-06-24 MED FILL — lamoTRIgine 200 MG TABS: 200 | 30 days supply | Qty: 30 | Fill #0

## 2017-06-24 MED FILL — FLUoxetine HCL 40 MG CAPS: 40 | 30 days supply | Qty: 30 | Fill #1

## 2017-06-25 MED FILL — HUMIRA PEN 40 MG/0.8ML PNKT: 40 | 28 days supply | Qty: 2 | Fill #2

## 2017-06-25 MED FILL — ESTRADIOL 0.1 MG/GM CREA: 0.1 | 90 days supply | Qty: 43 | Fill #0

## 2017-07-09 DIAGNOSIS — H01025 Squamous blepharitis left lower eyelid: Secondary | ICD-10-CM | POA: Diagnosis not present

## 2017-07-09 DIAGNOSIS — H04123 Dry eye syndrome of bilateral lacrimal glands: Secondary | ICD-10-CM | POA: Diagnosis not present

## 2017-07-09 DIAGNOSIS — H10503 Unspecified blepharoconjunctivitis, bilateral: Secondary | ICD-10-CM | POA: Diagnosis not present

## 2017-07-09 DIAGNOSIS — H01022 Squamous blepharitis right lower eyelid: Secondary | ICD-10-CM | POA: Diagnosis not present

## 2017-07-09 DIAGNOSIS — H01021 Squamous blepharitis right upper eyelid: Secondary | ICD-10-CM | POA: Diagnosis not present

## 2017-07-09 DIAGNOSIS — H01024 Squamous blepharitis left upper eyelid: Secondary | ICD-10-CM | POA: Diagnosis not present

## 2017-07-09 MED FILL — XIIDRA 5% EYE DROPS: 5 | 30 days supply | Qty: 60 | Fill #2

## 2017-07-09 MED FILL — ALPRAZolam 0.5 MG TABS: 0.5 | 30 days supply | Qty: 20 | Fill #1

## 2017-07-22 DIAGNOSIS — F411 Generalized anxiety disorder: Secondary | ICD-10-CM | POA: Diagnosis not present

## 2017-07-23 ENCOUNTER — Encounter: Payer: Self-pay | Admitting: Family Medicine

## 2017-07-23 DIAGNOSIS — M0600 Rheumatoid arthritis without rheumatoid factor, unspecified site: Secondary | ICD-10-CM | POA: Diagnosis not present

## 2017-07-23 DIAGNOSIS — M2550 Pain in unspecified joint: Secondary | ICD-10-CM | POA: Diagnosis not present

## 2017-07-23 DIAGNOSIS — M25552 Pain in left hip: Secondary | ICD-10-CM | POA: Diagnosis not present

## 2017-07-23 DIAGNOSIS — Z6821 Body mass index (BMI) 21.0-21.9, adult: Secondary | ICD-10-CM | POA: Diagnosis not present

## 2017-07-23 DIAGNOSIS — M7062 Trochanteric bursitis, left hip: Secondary | ICD-10-CM | POA: Diagnosis not present

## 2017-07-23 DIAGNOSIS — R102 Pelvic and perineal pain: Secondary | ICD-10-CM | POA: Diagnosis not present

## 2017-07-23 DIAGNOSIS — M150 Primary generalized (osteo)arthritis: Secondary | ICD-10-CM | POA: Diagnosis not present

## 2017-07-23 DIAGNOSIS — Z79899 Other long term (current) drug therapy: Secondary | ICD-10-CM | POA: Diagnosis not present

## 2017-07-23 DIAGNOSIS — Z1589 Genetic susceptibility to other disease: Secondary | ICD-10-CM | POA: Diagnosis not present

## 2017-08-03 MED FILL — ESTRADIOL 0.1 MG/24HR PTTW: 0.1 | 84 days supply | Qty: 24 | Fill #0

## 2017-08-12 DIAGNOSIS — M4310 Spondylolisthesis, site unspecified: Secondary | ICD-10-CM | POA: Diagnosis not present

## 2017-08-13 ENCOUNTER — Other Ambulatory Visit (HOSPITAL_COMMUNITY): Payer: Self-pay | Admitting: Neurosurgery

## 2017-08-13 DIAGNOSIS — M4310 Spondylolisthesis, site unspecified: Secondary | ICD-10-CM

## 2017-08-13 MED FILL — lamoTRIgine 200 MG TABS: 200 | 30 days supply | Qty: 30 | Fill #1

## 2017-09-02 ENCOUNTER — Ambulatory Visit (HOSPITAL_COMMUNITY)
Admission: RE | Admit: 2017-09-02 | Discharge: 2017-09-02 | Disposition: A | Discharge status: Home / Self Care | Payer: 59 | Source: Ambulatory Visit | Attending: Neurosurgery | Admitting: Neurosurgery

## 2017-09-02 DIAGNOSIS — M47816 Spondylosis without myelopathy or radiculopathy, lumbar region: Secondary | ICD-10-CM | POA: Diagnosis not present

## 2017-09-02 DIAGNOSIS — Z981 Arthrodesis status: Secondary | ICD-10-CM | POA: Diagnosis not present

## 2017-09-02 DIAGNOSIS — M4310 Spondylolisthesis, site unspecified: Secondary | ICD-10-CM | POA: Diagnosis present

## 2017-09-02 DIAGNOSIS — M48061 Spinal stenosis, lumbar region without neurogenic claudication: Secondary | ICD-10-CM | POA: Diagnosis not present

## 2017-09-02 DIAGNOSIS — M4326 Fusion of spine, lumbar region: Secondary | ICD-10-CM | POA: Diagnosis not present

## 2017-09-02 DIAGNOSIS — M1288 Other specific arthropathies, not elsewhere classified, other specified site: Secondary | ICD-10-CM | POA: Diagnosis not present

## 2017-09-02 DIAGNOSIS — M4316 Spondylolisthesis, lumbar region: Secondary | ICD-10-CM | POA: Diagnosis not present

## 2017-09-02 DIAGNOSIS — M5136 Other intervertebral disc degeneration, lumbar region: Secondary | ICD-10-CM | POA: Insufficient documentation

## 2017-09-02 LAB — POCT I-STAT CREATININE: Creatinine, Ser: 0.9 mg/dL (ref 0.44–1.00)

## 2017-09-02 MED ORDER — GADOBENATE DIMEGLUMINE 529 MG/ML IV SOLN
10.0000 mL | Freq: Once | INTRAVENOUS | Status: AC | PRN
  Administered 2017-09-02: 10 mL via INTRAVENOUS

## 2017-09-14 MED FILL — FLUoxetine HCL 40 MG CAPS: 40 | 30 days supply | Qty: 30 | Fill #0

## 2017-09-14 MED FILL — lamoTRIgine 200 MG TABS: 200 | 30 days supply | Qty: 30 | Fill #0

## 2017-09-16 DIAGNOSIS — M4310 Spondylolisthesis, site unspecified: Secondary | ICD-10-CM | POA: Diagnosis not present

## 2017-09-18 ENCOUNTER — Other Ambulatory Visit: Payer: Self-pay | Admitting: Neurosurgery

## 2017-09-18 DIAGNOSIS — M4310 Spondylolisthesis, site unspecified: Secondary | ICD-10-CM

## 2017-09-23 DIAGNOSIS — F411 Generalized anxiety disorder: Secondary | ICD-10-CM | POA: Diagnosis not present

## 2017-09-25 MED FILL — ALPRAZolam 0.5 MG TABS: 0.5 | 30 days supply | Qty: 30 | Fill #0

## 2017-10-15 MED FILL — PAZEO 0.7% EYE DROPS: 0.7 | 30 days supply | Qty: 3 | Fill #1

## 2017-10-15 MED FILL — lamoTRIgine 200 MG TABS: 200 | 30 days supply | Qty: 30 | Fill #1

## 2017-11-26 MED FILL — ALPRAZolam 0.5 MG TABS: 0.5 | 30 days supply | Qty: 30 | Fill #1

## 2017-11-26 MED FILL — lamoTRIgine 200 MG TABS: 200 | 30 days supply | Qty: 30 | Fill #2

## 2017-12-08 ENCOUNTER — Other Ambulatory Visit: Payer: Self-pay | Admitting: Internal Medicine

## 2017-12-08 MED FILL — OMEPRAZOLE 20 MG CAP: 20 | 90 days supply | Qty: 90 | Fill #0

## 2017-12-16 DIAGNOSIS — F411 Generalized anxiety disorder: Secondary | ICD-10-CM | POA: Diagnosis not present

## 2017-12-29 MED FILL — lamoTRIgine 200 MG TABS: 200 | 30 days supply | Qty: 30 | Fill #0

## 2017-12-29 MED FILL — ALPRAZolam 0.5 MG TABS: 0.5 | 30 days supply | Qty: 30 | Fill #0

## 2018-01-06 MED FILL — PAZEO 0.7% EYE DROPS: 0.7 | 30 days supply | Qty: 3 | Fill #2

## 2018-01-06 MED FILL — FLUoxetine HCL 40 MG CAPS: 40 | 30 days supply | Qty: 30 | Fill #0

## 2018-02-03 MED FILL — lamoTRIgine 200 MG TABS: 200 | 30 days supply | Qty: 30 | Fill #1

## 2018-02-08 MED FILL — XIIDRA 5% EYE DROPS: 5 | 30 days supply | Qty: 60 | Fill #0

## 2018-02-24 DIAGNOSIS — R82998 Other abnormal findings in urine: Secondary | ICD-10-CM | POA: Diagnosis not present

## 2018-02-24 DIAGNOSIS — R300 Dysuria: Secondary | ICD-10-CM | POA: Diagnosis not present

## 2018-02-24 DIAGNOSIS — R3982 Chronic bladder pain: Secondary | ICD-10-CM | POA: Diagnosis not present

## 2018-02-24 DIAGNOSIS — N760 Acute vaginitis: Secondary | ICD-10-CM | POA: Diagnosis not present

## 2018-03-01 MED FILL — ALPRAZolam 0.5 MG TABS: 0.5 | 30 days supply | Qty: 30 | Fill #1

## 2018-03-01 MED FILL — lamoTRIgine 200 MG TABS: 200 | 30 days supply | Qty: 30 | Fill #2

## 2018-03-01 MED FILL — FLUoxetine HCL 40 MG CAPS: 40 | 30 days supply | Qty: 30 | Fill #1

## 2018-03-08 MED FILL — ESTRADIOL 0.1 MG/24HR PTTW: 0.1 | 84 days supply | Qty: 24 | Fill #1

## 2018-03-10 DIAGNOSIS — F411 Generalized anxiety disorder: Secondary | ICD-10-CM | POA: Diagnosis not present

## 2018-03-22 MED FILL — predniSONE 5 MG TABS: 5 | 12 days supply | Qty: 48 | Fill #0

## 2018-03-23 DIAGNOSIS — Z6821 Body mass index (BMI) 21.0-21.9, adult: Secondary | ICD-10-CM | POA: Diagnosis not present

## 2018-03-23 DIAGNOSIS — Z79899 Other long term (current) drug therapy: Secondary | ICD-10-CM | POA: Diagnosis not present

## 2018-03-23 DIAGNOSIS — M7062 Trochanteric bursitis, left hip: Secondary | ICD-10-CM | POA: Diagnosis not present

## 2018-03-23 DIAGNOSIS — Z1589 Genetic susceptibility to other disease: Secondary | ICD-10-CM | POA: Diagnosis not present

## 2018-03-23 DIAGNOSIS — R102 Pelvic and perineal pain: Secondary | ICD-10-CM | POA: Diagnosis not present

## 2018-03-23 DIAGNOSIS — M25552 Pain in left hip: Secondary | ICD-10-CM | POA: Diagnosis not present

## 2018-03-23 DIAGNOSIS — M2550 Pain in unspecified joint: Secondary | ICD-10-CM | POA: Diagnosis not present

## 2018-03-23 DIAGNOSIS — M150 Primary generalized (osteo)arthritis: Secondary | ICD-10-CM | POA: Diagnosis not present

## 2018-03-23 DIAGNOSIS — M0600 Rheumatoid arthritis without rheumatoid factor, unspecified site: Secondary | ICD-10-CM | POA: Diagnosis not present

## 2018-03-24 ENCOUNTER — Telehealth: Payer: Self-pay | Admitting: Pharmacist

## 2018-03-24 ENCOUNTER — Ambulatory Visit (INDEPENDENT_AMBULATORY_CARE_PROVIDER_SITE_OTHER): Payer: 59 | Admitting: Pharmacist

## 2018-03-24 DIAGNOSIS — Z79899 Other long term (current) drug therapy: Secondary | ICD-10-CM

## 2018-03-24 MED ORDER — ETANERCEPT 50 MG/ML ~~LOC~~ SOSY: 50.0000 mg | PREFILLED_SYRINGE | SUBCUTANEOUS | 3 refills | Status: DC

## 2018-03-24 NOTE — Progress Notes (Signed)
   S: Patient presents to [facility] for review of their specialty medication therapy.  Patient is currently prescribed Humira for rheumatoid arthritis. Patient is managed by Dr. Trudie Reed for this.   She reports that she was on Enbrel in the past but stopped it because she didn't feel like she needed it anymore. She was on methotrexate as well. She reports join pain and fatigue and is worried RA will impact her ability to function at work so she understands that Humira may help with that.   Adherence: she has not started it yet  Efficacy: she has not started it yet  Dosing:  Rheumatoid arthritis: SubQ: 40 mg every other week (may continue methotrexate, other nonbiologic DMARDS, corticosteroids, NSAIDs, and/or analgesics); patients not taking concomitant methotrexate may increase dose to 40 mg every week  Dose adjustments: Renal: no dose adjustments (has not been studied) Hepatic: no dose adjustments (has not been studied)  Drug-drug interactions: none  Screening: TB test: completed per patient Hepatitis: completed per patient  Monitoring: S/sx of infection: denies CBC: completed S/sx of hypersensitivity: n/a S/sx of malignancy:denies S/sx of heart failure: denies  O:     Lab Results  Component Value Date   WBC 3.5 (L) 04/09/2017   HGB 13.7 04/09/2017   HCT 43.1 04/09/2017   MCV 96.2 04/09/2017   PLT 171.0 04/09/2017      Chemistry      Component Value Date/Time   NA 138 04/09/2017 0802   NA 143 12/22/2016 0948   K 5.3 (H) 04/09/2017 0802   CL 104 04/09/2017 0802   CO2 31 04/09/2017 0802   BUN 14 04/09/2017 0802   BUN 17 12/22/2016 0948   CREATININE 0.90 09/02/2017 1535      Component Value Date/Time   CALCIUM 9.0 04/09/2017 0802   ALKPHOS 77 12/22/2016 0948   AST 23 12/22/2016 0948   ALT 14 12/22/2016 0948   BILITOT 0.3 12/22/2016 0948       A/P: 1. Medication review: Patient currently prescribed Enbrel for rheumatoid  arthritis but she has not started it yet. She is switching from Humira. Reviewed the medication with the patient, including the following: Enbrel (etanercept) binds tumor necrosis factor (TNF) and blocks its interaction with cell surface receptors. TNF plays an important role in the inflammatory processes of many diseases. Patient educated on purpose, proper use and potential adverse effects of Enbrel. Adverse effects include rash, GI upset, increased risk of infection, and injection site reactions. Patients should stop Enbrel if they develop a serious infection. There is a possible increased risk in lymphoma and other malignancies. No recommendations for any changes.    Christella Hartigan, PharmD, BCPS, BCACP, CPP Clinical Pharmacist Practitioner  [phone]

## 2018-03-24 NOTE — Telephone Encounter (Signed)
Called patient to schedule an appointment for the [facility]. I was unable to reach the patient so I left a HIPAA-compliant message requesting that the patient return my call.   

## 2018-03-29 MED FILL — PAZEO 0.7% EYE DROPS: 0.7 | 30 days supply | Qty: 3 | Fill #3

## 2018-03-29 MED FILL — FLUoxetine HCL 40 MG CAPS: 40 | 30 days supply | Qty: 30 | Fill #2

## 2018-03-30 MED FILL — MELOXICAM 15 MG TABLET: 15 | 90 days supply | Qty: 90 | Fill #0

## 2018-04-08 MED FILL — SUBVENITE 200 MG TABS: 200 | 30 days supply | Qty: 30 | Fill #0

## 2018-04-09 ENCOUNTER — Encounter: Payer: Self-pay | Admitting: *Deleted

## 2018-04-19 NOTE — Progress Notes (Signed)
HPI:  Using dictation device. Unfortunately this device frequently misinterprets words/phrases.  Here for CPE:  -Concerns and/or follow up today:  Due for flu vaccine, ? Hep c screen, ? Pap, labs  Chronic medical problems summarized below were reviewed for changes.  Reports is doing well for the most part.  Reports she had labs recently with her rheumatologist, blood counts and CMP.  She wants to check her cholesterol and diabetes screening labs with her physical today.  Also would like to do a hepatitis C screening.  She had her flu shot elsewhere.  She has her Pap scheduled in November with her gynecologist.  She sees a dermatologist for her skin checks.  She requests a refill on clobetasol solution for her scalp.  Her dermatologist usually refills this, but they would not refill it until her appointment and they did not have openings until next year.  She uses as needed rarely for seborrheic dermatitis.   Sees Dr. Nori Riis for gyn, breast and bone health. Sees Dermatologist for skin checks  HTN/Palpitations: -has seen cardiologist for evaluation -on metoprolol in the past for palpitations, made BP too low -Denies: CP, sob, doe, swelling  IBS/GERD: -sees Dr. Arelia Longest for this -denies: blood in stool, weight loss  Depression and Anxiety: -sees psychiatry for management, recurrent - PHQ9 4 today, but she feels is stable and fairly well controlled currently -extensive evaluation for memory concerns in 2018, slew of labs, neuropsych testing and neurology evaluation pointed to anxiety as cause -meds include: xanax, prozac, vistaril, lamictal - prior husband committed suicide -struggles with sleep -reports doing much better  Hx chronic back pain: -managed by Dr. Trenton Gammon -doing much better since surgery in 2014  -uses valium rarely for spasm  Hx migraines: -very rare now -zomig in the past  Hot Flashes: -on HRT, whenever has tried to stop has bad hot  flashes  Raynauds/Polyarthralgia: -sees [facility]. Trudie Reed at [facility] rhe[facility] -started humira in 2018   -Diet: variety of foods, balance and well rounded, larger portion sizes -Exercise: no regular exercise -Taking folic acid, vitamin D or calcium: no -Diabetes and Dyslipidemia Screening: Fasting for labs -Vaccines: see vaccine section EPIC -pap history: sees Dr. Nori Riis, gyn -FDLMP: see nursing notes -sexual activity: yes, female partner, no new partners -wants STI testing (Hep C if born 70-65): no -FH breast, colon or ovarian ca: see FH Last mammogram: sees Dr. Nori Riis, gyn Last colon cancer screening: Up-to-date, sees gastroenterology Breast Ca Risk Assessment: see family history and pt history DEXA (>/= 57): Sees Dr. Nori Riis and gynecology, gets regular bone density tests per her report  -Alcohol, Tobacco, drug use: see social history  Review of Systems - no fevers, unintentional weight loss, vision loss, hearing loss, chest pain, sob, hemoptysis, melena, hematochezia, hematuria, genital discharge, changing or concerning skin lesions, bleeding, bruising, loc, thoughts of self harm or SI  Past Medical History:  Diagnosis Date  . Allergy   . Anemia    after birth of child 3yr ago  . Anxiety and depression    with insomnia  . Arthritis    uses Diclofenac gel;Rheumatoid  . Blood transfusion without reported diagnosis    x3  . Chronic back pain    ? RA, ? Lupus - reports saw rheumatologist in the past but better now, sees Dr. PTrenton Gammon . Eczema   . GERD (gastroesophageal reflux disease)    takes Omeprazole daily, with nausea  . Heart murmur   . Hemorrhoids   . History of bronchitis  winter of 2014  . History of kidney stones    passed on her own  . History of migraine    last one a yr ago and takes Zomig prn  . Hot flashes 11/10/2014  . HTN (hypertension) 03/31/2012  . Hx of echocardiogram    Echo (8/15):  EF 55-60%, no RWMA  . IBS (irritable bowel syndrome)    constipation  predominant  . IBS (irritable bowel syndrome)   . Interstitial cystitis    hx of UTI  . Numbness    both legs and related to back  . Osteopenia   . Osteoporosis    osteopenia  . Palpitations    on metoprolol in past but made BP too low  . Pneumonia    hx of;last time 68yr ago  . PONV (postoperative nausea and vomiting)    laryngospasm-1999  . Raynaud's disease /phenomenon 10/21/2012  . Seasonal allergies    Flonase daily     Past Surgical History:  Procedure Laterality Date  . ABDOMINAL HYSTERECTOMY  2007  . BACK SURGERY    . COLONOSCOPY  02/11/2013 and 12/24/04   internal and external hemorrhoids  . EXCISION MORTON'S NEUROMA Right   . FLEXIBLE SIGMOIDOSCOPY  03/07/2008   internal and external hemorrhoids  . TONSILLECTOMY    . UPPER GASTROINTESTINAL ENDOSCOPY  10/23/2010   gastritis, irregular Z-line  . uterine ablation    . wisdom teeth extracted      Family History  Problem Relation Age of Onset  . Coronary artery disease Father   . Heart attack Father   . Hypertension Father   . Depression Father   . Hyperlipidemia Father   . Diverticulosis Mother   . Hypertension Mother   . Osteoarthritis Mother   . Depression Mother   . Diabetes Paternal Uncle   . Hypertension Maternal Grandmother   . Stroke Maternal Grandmother   . Hypertension Maternal Grandfather   . Cancer Maternal Grandfather   . Stroke Paternal Grandfather   . Hyperlipidemia Paternal Grandfather   . Depression Paternal Grandmother   . Dementia Paternal Grandmother   . Depression Sister   . Colon cancer Neg Hx     Social History   Socioeconomic History  . Marital status: Married    Spouse name: Not on file  . Number of children: Not on file  . Years of education: Not on file  . Highest education level: Not on file  Occupational History  . 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 Financial resource strain: Not on file  . Food insecurity:    Worry: Not on file    Inability:  Not on file  . Transportation needs:    Medical: Not on file    Non-medical: Not on file  Tobacco Use  . Smoking status: Never Smoker  . Smokeless tobacco: Never Used  Substance and Sexual Activity  . Alcohol use: Yes    Alcohol/week: 2.0 standard drinks    Types: 1 Standard drinks or equivalent, 1 Shots of liquor per week    Comment: one margarita a week  . Drug use: No  . Sexual activity: Yes  Lifestyle  . Physical activity:    Days per week: Not on file    Minutes per session: Not on file  . Stress: Not on file  Relationships  . Social connections:    Talks on phone: Not on file    Gets together: Not on file  Attends religious service: Not on file    Active member of club or organization: Not on file    Attends meetings of clubs or organizations: Not on file    Relationship status: Not on file  Other Topics Concern  . Not on file  Social History Narrative   Work or School: Therapist, sports at [employer] Situation: husband committed suicide      Spiritual Beliefs: methodist      Lifestyle: no regular exercise; diet is healthy        Current Outpatient Medications:  .  ALPRAZolam (XANAX XR) 0.5 MG 24 hr tablet, Take 1 mg by mouth at bedtime. , Disp: , Rfl: 0 .  CARAFATE 1 GM/10ML suspension, TAKE 10 MLS BY MOUTH 3 TIMES DAILY AS NEEDED FOR DIGESTION, Disp: 840 mL, Rfl: 5 .  cholecalciferol (VITAMIN D) 1000 UNITS tablet, Take 1,000 Units by mouth every evening., Disp: , Rfl:  .  estradiol (VIVELLE-DOT) 0.075 MG/24HR, Place 1 patch onto the skin 2 (two) times a week. Wednesday and Sunday, Disp: , Rfl:  .  etanercept (ENBREL) 50 MG/ML injection, Inject 0.98 mLs (50 mg total) into the skin once a week. 1 ml weekly subcutaneous 30 days, Disp: 4 Syringe, Rfl: 3 .  FLUoxetine (PROZAC) 20 MG capsule, Take 40 mg by mouth daily. , Disp: , Rfl: 5 .  fluticasone (FLONASE) 50 MCG/ACT nasal spray, Place 2 sprays into the nose daily., Disp: 16 g, Rfl: 5 .  hydrocortisone  (ANUSOL-HC) 25 MG suppository, One at bedtime for 5 nights then as needed, Disp: 24 suppository, Rfl: 1 .  lamoTRIgine (LAMICTAL) 200 MG tablet, , Disp: , Rfl: 2 .  meloxicam (MOBIC) 15 MG tablet, , Disp: , Rfl: 3 .  Multiple Vitamin (MULTIVITAMIN WITH MINERALS) TABS, Take 1 tablet by mouth every evening., Disp: , Rfl:  .  omeprazole (PRILOSEC) 20 MG capsule, TAKE 1 CAPSULE BY MOUTH ONCE DAILY, Disp: 90 capsule, Rfl: 3 .  XIIDRA 5 % SOLN, , Disp: , Rfl: 9 .  ZOMIG 5 MG tablet, TAKE 1 TABLET BY MOUTH AT THE TIME OF HEADACHE, MAY REPEAT IN ONE HOUR IF HEADACHE PERSISTS, Disp: 9 tablet, Rfl: 0 .  clobetasol (TEMOVATE) 0.05 % external solution, Apply 1 application topically daily. As needed for seb derm., Disp: 50 mL, Rfl: 0  EXAM:  Vitals:   04/20/18 0902  BP: 108/72  Pulse: 73  Temp: 98.3 F (36.8 C)    GENERAL: vitals reviewed and listed below, alert, oriented, appears well hydrated and in no acute distress  HEENT: head atraumatic, PERRLA, normal appearance of eyes, ears, nose and mouth. moist mucus membranes.  NECK: supple, no masses or lymphadenopathy  LUNGS: clear to auscultation bilaterally, no rales, rhonchi or wheeze  CV: HRRR, no peripheral edema or cyanosis, normal pedal pulses  ABDOMEN: bowel sounds normal, soft, non tender to palpation, no masses, no rebound or guarding  GU/BREAST: sees gyn, declined  SKIN: no rash or abnormal lesions, sees dermatology for full skin exams  MS: normal gait, moves all extremities normally  NEURO: normal gait, speech and thought processing grossly intact, muscle tone grossly intact throughout  PSYCH: normal affect, pleasant and cooperative  ASSESSMENT AND PLAN:  Discussed the following assessment and plan:  PREVENTIVE EXAM: -Discussed and advised all [place] preventive services health task force level A and B recommendations for age, sex and risks. -Advised at least 150 minutes of exercise per week and a healthy diet with  avoidance of  (less then 1 serving per week) processed foods, white starches, red meat, fast foods and sweets and consisting of: * 5-9 servings of fresh fruits and vegetables (not corn or potatoes) *nuts and seeds, beans *olives and olive oil *lean meats such as fish and white chicken  *whole grains -labs, studies and vaccines per orders this encounter - Lipid panel - Hemoglobin A1c  2. Seborrheic dermatitis of scalp -refilled top clob per her request to get to her derm appt  3. Essential hypertension -stable, cont current tx  4. Depression, recurrent ([place]) -stable, sees psychiatry for management, see phq9   5. Anxiety and depression -sees psychiatry  6. Raynaud's disease without gangrene 7. Polyarthralgia -sees rheumatology for managment  8. Need for hepatitis C screening test - Hepatitis C antibody  Follow up 4 months, sooner as needed  Patient Instructions  BEFORE YOU LEAVE: -enter PHq9 -labs -follow up: 4-6 months  We have ordered labs or studies at this visit. It can take up to 1-2 weeks for results and processing. IF results require follow up or explanation, we will call you with instructions. Clinically stable results will be released to your [facility]. If you have not heard from [place] or cannot find your results in [facility] in 2 weeks please contact our office at [phone].  If you are not yet signed up for [facility], please consider signing up.   Preventive Care 40-64 Years, Female Preventive care refers to lifestyle choices and visits with your health care provider that can promote health and wellness. What does preventive care include?  A yearly physical exam. This is also called an annual well check.  Dental exams once or twice a year.  Routine eye exams. Ask your health care provider how often you should have your eyes checked.  Personal lifestyle choices, including: ? Daily care of your teeth and gums. ? Regular physical activity. ? Eating a healthy diet. ? Avoiding  tobacco and drug use. ? Limiting alcohol use. ? Practicing safe sex. ? Taking low-dose aspirin daily starting at age 58. ? Taking vitamin and mineral supplements as recommended by your health care provider. What happens during an annual well check? The services and screenings done by your health care provider during your annual well check will depend on your age, overall health, lifestyle risk factors, and family history of disease. Counseling Your health care provider may ask you questions about your:  Alcohol use.  Tobacco use.  Drug use.  Emotional well-being.  Home and relationship well-being.  Sexual activity.  Eating habits.  Work and work Statistician.  Method of birth control.  Menstrual cycle.  Pregnancy history.  Screening You may have the following tests or measurements:  Height, weight, and BMI.  Blood pressure.  Lipid and cholesterol levels. These may be checked every 5 years, or more frequently if you are over 20 years old.  Skin check.  Lung cancer screening. You may have this screening every year starting at age 59 if you have a 30-pack-year history of smoking and currently smoke or have quit within the past 15 years.  Fecal occult blood test (FOBT) of the stool. You may have this test every year starting at age 58.  Flexible sigmoidoscopy or colonoscopy. You may have a sigmoidoscopy every 5 years or a colonoscopy every 10 years starting at age 59.  Hepatitis C blood test.  Hepatitis B blood test.  Sexually transmitted disease (STD) testing.  Diabetes screening. This is done by checking your blood  sugar (glucose) after you have not eaten for a while (fasting). You may have this done every 1-3 years.  Mammogram. This may be done every 1-2 years. Talk to your health care provider about when you should start having regular mammograms. This may depend on whether you have a family history of breast cancer.  BRCA-related cancer screening. This may  be done if you have a family history of breast, ovarian, tubal, or peritoneal cancers.  Pelvic exam and Pap test. This may be done every 3 years starting at age 2. Starting at age 29, this may be done every 5 years if you have a Pap test in combination with an HPV test.  Bone density scan. This is done to screen for osteoporosis. You may have this scan if you are at high risk for osteoporosis.  Discuss your test results, treatment options, and if necessary, the need for more tests with your health care provider. Vaccines Your health care provider may recommend certain vaccines, such as:  Influenza vaccine. This is recommended every year.  Tetanus, diphtheria, and acellular pertussis (Tdap, Td) vaccine. You may need a Td booster every 10 years.  Varicella vaccine. You may need this if you have not been vaccinated.  Zoster vaccine. You may need this after age 17.  Measles, mumps, and rubella (MMR) vaccine. You may need at least one dose of MMR if you were born in 1957 or later. You may also need a second dose.  Pneumococcal 13-valent conjugate (PCV13) vaccine. You may need this if you have certain conditions and were not previously vaccinated.  Pneumococcal polysaccharide (PPSV23) vaccine. You may need one or two doses if you smoke cigarettes or if you have certain conditions.  Meningococcal vaccine. You may need this if you have certain conditions.  Hepatitis A vaccine. You may need this if you have certain conditions or if you travel or work in places where you may be exposed to hepatitis A.  Hepatitis B vaccine. You may need this if you have certain conditions or if you travel or work in places where you may be exposed to hepatitis B.  Haemophilus influenzae type b (Hib) vaccine. You may need this if you have certain conditions.  Talk to your health care provider about which screenings and vaccines you need and how often you need them. This information is not intended to replace  advice given to you by your health care provider. Make sure you discuss any questions you have with your health care provider. Document Released: 07/27/2015 Document Revised: 03/19/2016 Document Reviewed: 05/01/2015 Elsevier Interactive Patient Education  2018 [employer].          No follow-ups on file.  Carol Kern, DO

## 2018-04-20 ENCOUNTER — Ambulatory Visit (INDEPENDENT_AMBULATORY_CARE_PROVIDER_SITE_OTHER): Payer: 59 | Admitting: Family Medicine

## 2018-04-20 ENCOUNTER — Ambulatory Visit (INDEPENDENT_AMBULATORY_CARE_PROVIDER_SITE_OTHER): Payer: 59 | Admitting: Psychiatry

## 2018-04-20 ENCOUNTER — Encounter: Payer: Self-pay | Admitting: Family Medicine

## 2018-04-20 VITALS — BP 108/72 | HR 73 | Temp 98.3°F | Ht 62.25 in | Wt 120.9 lb

## 2018-04-20 DIAGNOSIS — F339 Major depressive disorder, recurrent, unspecified: Secondary | ICD-10-CM | POA: Diagnosis not present

## 2018-04-20 DIAGNOSIS — I10 Essential (primary) hypertension: Secondary | ICD-10-CM

## 2018-04-20 DIAGNOSIS — F4310 Post-traumatic stress disorder, unspecified: Secondary | ICD-10-CM | POA: Diagnosis not present

## 2018-04-20 DIAGNOSIS — H01024 Squamous blepharitis left upper eyelid: Secondary | ICD-10-CM | POA: Diagnosis not present

## 2018-04-20 DIAGNOSIS — H01025 Squamous blepharitis left lower eyelid: Secondary | ICD-10-CM | POA: Diagnosis not present

## 2018-04-20 DIAGNOSIS — F331 Major depressive disorder, recurrent, moderate: Secondary | ICD-10-CM | POA: Diagnosis not present

## 2018-04-20 DIAGNOSIS — H01021 Squamous blepharitis right upper eyelid: Secondary | ICD-10-CM | POA: Diagnosis not present

## 2018-04-20 DIAGNOSIS — F411 Generalized anxiety disorder: Secondary | ICD-10-CM

## 2018-04-20 DIAGNOSIS — I7300 Raynaud's syndrome without gangrene: Secondary | ICD-10-CM

## 2018-04-20 DIAGNOSIS — Z0000 Encounter for general adult medical examination without abnormal findings: Secondary | ICD-10-CM

## 2018-04-20 DIAGNOSIS — L219 Seborrheic dermatitis, unspecified: Secondary | ICD-10-CM

## 2018-04-20 DIAGNOSIS — F39 Unspecified mood [affective] disorder: Secondary | ICD-10-CM | POA: Diagnosis not present

## 2018-04-20 DIAGNOSIS — Z1159 Encounter for screening for other viral diseases: Secondary | ICD-10-CM | POA: Diagnosis not present

## 2018-04-20 DIAGNOSIS — M2550 Pain in unspecified joint: Secondary | ICD-10-CM | POA: Diagnosis not present

## 2018-04-20 DIAGNOSIS — H01022 Squamous blepharitis right lower eyelid: Secondary | ICD-10-CM | POA: Diagnosis not present

## 2018-04-20 DIAGNOSIS — H10503 Unspecified blepharoconjunctivitis, bilateral: Secondary | ICD-10-CM | POA: Diagnosis not present

## 2018-04-20 DIAGNOSIS — H04123 Dry eye syndrome of bilateral lacrimal glands: Secondary | ICD-10-CM | POA: Diagnosis not present

## 2018-04-20 LAB — LIPID PANEL
Cholesterol: 202 mg/dL — ABNORMAL HIGH (ref 0–200)
HDL: 84.7 mg/dL (ref >39.00)
LDL Cholesterol: 105 mg/dL — ABNORMAL HIGH (ref 0–99)
NonHDL: 117.14
Total CHOL/HDL Ratio: 2
Triglycerides: 59 mg/dL (ref 0.0–149.0)
VLDL: 11.8 mg/dL (ref 0.0–40.0)

## 2018-04-20 LAB — HEMOGLOBIN A1C: Hgb A1c MFr Bld: 5.6 % (ref 4.6–6.5)

## 2018-04-20 MED ORDER — CLOBETASOL PROPIONATE 0.05 % EX SOLN: 1.0000 "application " | Freq: Every day | CUTANEOUS | 0 refills | Status: DC

## 2018-04-20 MED ORDER — FLUOXETINE HCL 20 MG PO CAPS: 40.0000 mg | ORAL_CAPSULE | Freq: Every day | ORAL | 2 refills | Status: DC

## 2018-04-20 MED ORDER — ALPRAZOLAM 0.5 MG PO TABS: 0.5000 mg | ORAL_TABLET | Freq: Every evening | ORAL | 2 refills | Status: DC | PRN

## 2018-04-20 MED ORDER — LAMOTRIGINE 200 MG PO TABS: 200.0000 mg | ORAL_TABLET | Freq: Every day | ORAL | 2 refills | Status: DC

## 2018-04-20 NOTE — Patient Instructions (Signed)
BEFORE YOU LEAVE: -enter PHq9 -labs -follow up: 4-6 months  We have ordered labs or studies at this visit. It can take up to 1-2 weeks for results and processing. IF results require follow up or explanation, we will call you with instructions. Clinically stable results will be released to your [facility]. If you have not heard from [place] or cannot find your results in [facility] in 2 weeks please contact our office at [phone].  If you are not yet signed up for [facility], please consider signing up.   Preventive Care 40-64 Years, Female Preventive care refers to lifestyle choices and visits with your health care provider that can promote health and wellness. What does preventive care include?  A yearly physical exam. This is also called an annual well check.  Dental exams once or twice a year.  Routine eye exams. Ask your health care provider how often you should have your eyes checked.  Personal lifestyle choices, including: ? Daily care of your teeth and gums. ? Regular physical activity. ? Eating a healthy diet. ? Avoiding tobacco and drug use. ? Limiting alcohol use. ? Practicing safe sex. ? Taking low-dose aspirin daily starting at age 3. ? Taking vitamin and mineral supplements as recommended by your health care provider. What happens during an annual well check? The services and screenings done by your health care provider during your annual well check will depend on your age, overall health, lifestyle risk factors, and family history of disease. Counseling Your health care provider may ask you questions about your:  Alcohol use.  Tobacco use.  Drug use.  Emotional well-being.  Home and relationship well-being.  Sexual activity.  Eating habits.  Work and work Statistician.  Method of birth control.  Menstrual cycle.  Pregnancy history.  Screening You may have the following tests or measurements:  Height, weight, and BMI.  Blood pressure.  Lipid and  cholesterol levels. These may be checked every 5 years, or more frequently if you are over 2 years old.  Skin check.  Lung cancer screening. You may have this screening every year starting at age 21 if you have a 30-pack-year history of smoking and currently smoke or have quit within the past 15 years.  Fecal occult blood test (FOBT) of the stool. You may have this test every year starting at age 6.  Flexible sigmoidoscopy or colonoscopy. You may have a sigmoidoscopy every 5 years or a colonoscopy every 10 years starting at age 27.  Hepatitis C blood test.  Hepatitis B blood test.  Sexually transmitted disease (STD) testing.  Diabetes screening. This is done by checking your blood sugar (glucose) after you have not eaten for a while (fasting). You may have this done every 1-3 years.  Mammogram. This may be done every 1-2 years. Talk to your health care provider about when you should start having regular mammograms. This may depend on whether you have a family history of breast cancer.  BRCA-related cancer screening. This may be done if you have a family history of breast, ovarian, tubal, or peritoneal cancers.  Pelvic exam and Pap test. This may be done every 3 years starting at age 59. Starting at age 79, this may be done every 5 years if you have a Pap test in combination with an HPV test.  Bone density scan. This is done to screen for osteoporosis. You may have this scan if you are at high risk for osteoporosis.  Discuss your test results, treatment options, and if  necessary, the need for more tests with your health care provider. Vaccines Your health care provider may recommend certain vaccines, such as:  Influenza vaccine. This is recommended every year.  Tetanus, diphtheria, and acellular pertussis (Tdap, Td) vaccine. You may need a Td booster every 10 years.  Varicella vaccine. You may need this if you have not been vaccinated.  Zoster vaccine. You may need this after age  41.  Measles, mumps, and rubella (MMR) vaccine. You may need at least one dose of MMR if you were born in 1957 or later. You may also need a second dose.  Pneumococcal 13-valent conjugate (PCV13) vaccine. You may need this if you have certain conditions and were not previously vaccinated.  Pneumococcal polysaccharide (PPSV23) vaccine. You may need one or two doses if you smoke cigarettes or if you have certain conditions.  Meningococcal vaccine. You may need this if you have certain conditions.  Hepatitis A vaccine. You may need this if you have certain conditions or if you travel or work in places where you may be exposed to hepatitis A.  Hepatitis B vaccine. You may need this if you have certain conditions or if you travel or work in places where you may be exposed to hepatitis B.  Haemophilus influenzae type b (Hib) vaccine. You may need this if you have certain conditions.  Talk to your health care provider about which screenings and vaccines you need and how often you need them. This information is not intended to replace advice given to you by your health care provider. Make sure you discuss any questions you have with your health care provider. Document Released: 07/27/2015 Document Revised: 03/19/2016 Document Reviewed: 05/01/2015 Elsevier Interactive Patient Education  [employer].

## 2018-04-20 NOTE — Patient Instructions (Signed)
Patient desires to keep medications the same.  She will let me know if she is feels worse.

## 2018-04-20 NOTE — Progress Notes (Signed)
[facility]  Patient ID: [name],  MRN: [id number]  PCP: Lucretia Kern, DO  Date of Evaluation: 04/20/2018 Time spent:20 minutes   HISTORY/CURRENT STATUS: HPI patient is a 62 year old white female.  Last seen 03/10/2018.  At that time she has some personal stressors which was causing her anxiety.  Her PTSD had been worse the last several weeks.  Patient desired the same meds to be continued as it is.  Today patient states she feels slightly better.  She still has increased stressors.  1 of these is her husband recently  tested with increased PSA.  Patient denies depression.  Patient has not had any PTSD symptoms since her last visit.  Individual Medical History/ Review of Systems: Changes? :No  Allergies: Aspartame and phenylalanine; Beef-derived products; Citrus; Penicillins; Promethazine hcl; Adhesive [tape]; Doxycycline; and Morphine sulfate  Current Medications:  Current Outpatient Medications:  .  CARAFATE 1 GM/10ML suspension, TAKE 10 MLS BY MOUTH 3 TIMES DAILY AS NEEDED FOR DIGESTION, Disp: 840 mL, Rfl: 5 .  cholecalciferol (VITAMIN D) 1000 UNITS tablet, Take 1,000 Units by mouth every evening., Disp: , Rfl:  .  clobetasol (TEMOVATE) 0.05 % external solution, Apply 1 application topically daily. As needed for seb derm., Disp: 50 mL, Rfl: 0 .  estradiol (VIVELLE-DOT) 0.075 MG/24HR, Place 1 patch onto the skin 2 (two) times a week. Wednesday and Sunday, Disp: , Rfl:  .  etanercept (ENBREL) 50 MG/ML injection, Inject 0.98 mLs (50 mg total) into the skin once a week. 1 ml weekly subcutaneous 30 days, Disp: 4 Syringe, Rfl: 3 .  fluticasone (FLONASE) 50 MCG/ACT nasal spray, Place 2 sprays into the nose daily., Disp: 16 g, Rfl: 5 .  hydrocortisone (ANUSOL-HC) 25 MG suppository, One at bedtime for 5 nights then as needed, Disp: 24 suppository, Rfl: 1 .  meloxicam (MOBIC) 15 MG tablet, , Disp: , Rfl: 3 .  Multiple Vitamin (MULTIVITAMIN WITH MINERALS) TABS, Take 1 tablet  by mouth every evening., Disp: , Rfl:  .  omeprazole (PRILOSEC) 20 MG capsule, TAKE 1 CAPSULE BY MOUTH ONCE DAILY, Disp: 90 capsule, Rfl: 3 .  XIIDRA 5 % SOLN, , Disp: , Rfl: 9 .  ALPRAZolam (XANAX) 0.5 MG tablet, Take 1 tablet (0.5 mg total) by mouth at bedtime as needed for anxiety., Disp: 30 tablet, Rfl: 2 .  FLUoxetine (PROZAC) 20 MG capsule, Take 2 capsules (40 mg total) by mouth daily., Disp: 60 capsule, Rfl: 2 .  lamoTRIgine (LAMICTAL) 200 MG tablet, Take 1 tablet (200 mg total) by mouth daily., Disp: 30 tablet, Rfl: 2 Medication Side Effects: None  Family Medical/ Social History: Changes? No  MENTAL HEALTH EXAM:  There were no vitals taken for this visit.There is no height or weight on file to calculate BMI.  General Appearance: Casual  Eye Contact:  Good  Speech:  Normal Rate  Volume:  Normal  Mood:  Euthymic  Affect:  Appropriate  Thought Process:  Linear  Orientation:  Full (Time, Place, and Person)  Thought Content: Logical   Suicidal Thoughts:  No  Homicidal Thoughts:  No  Memory:  Immediate  Judgement:  Good  Insight:  normal  Psychomotor Activity:  Normal  Concentration:  Concentration: Good  Recall:  Good  Fund of Knowledge: Good  Language: Good  Akathisia:  NA  AIMS (if indicated): na  Assets:  Desire for Improvement  ADL's:  Intact  Cognition: WNL  Prognosis:  Good    DIAGNOSES:    ICD-10-CM  1. Generalized anxiety disorder F41.1   2. Major depressive disorder, recurrent episode, moderate (HCC) F33.1   3. PTSD (post-traumatic stress disorder) F43.10     RECOMMENDATIONS:  Continue same meds per pt. Request.   Comer Locket, PA-C

## 2018-04-21 LAB — HEPATITIS C ANTIBODY
Hepatitis C Ab: NONREACTIVE
SIGNAL TO CUT-OFF: 0.05 (ref <1.00)

## 2018-04-26 IMAGING — MR MR LUMBAR SPINE WO/W CM
4 of 7 series · 19 of 48 positions shown · IV contrast (10    MULTIHANCE)
Comparison: 07/13/2014

CLINICAL DATA: Previous lumbar fusion.  Acquired spondylolisthesis.

EXAM:
MRI LUMBAR SPINE WITHOUT AND WITH CONTRAST
TECHNIQUE: Multiplanar and multiecho pulse sequences of the lumbar spine were
obtained without and with intravenous contrast.
CONTRAST:  10mL MULTIHANCE GADOBENATE DIMEGLUMINE 529 MG/ML IV SOLN

[Series 3: T1 · sagittal · 4.0mm · 0.51mm/px · 4 of 15 slices shown (1 of 2)]
[im 1/15]
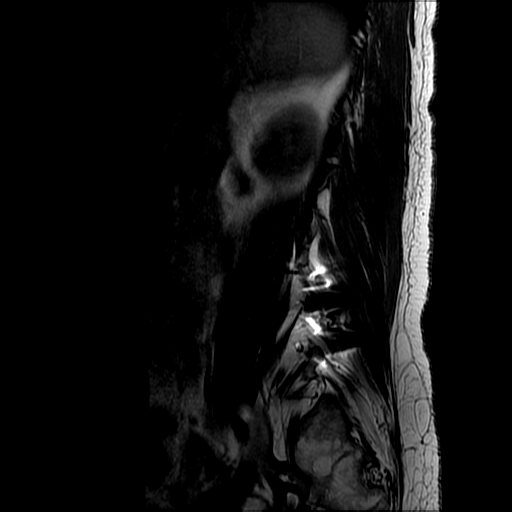
[im 4/15]
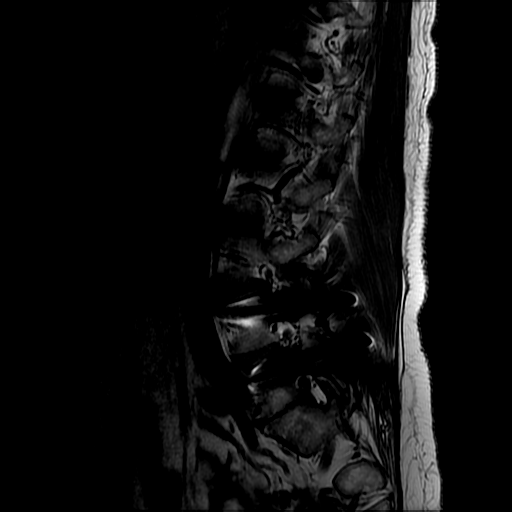
[im 8/15]
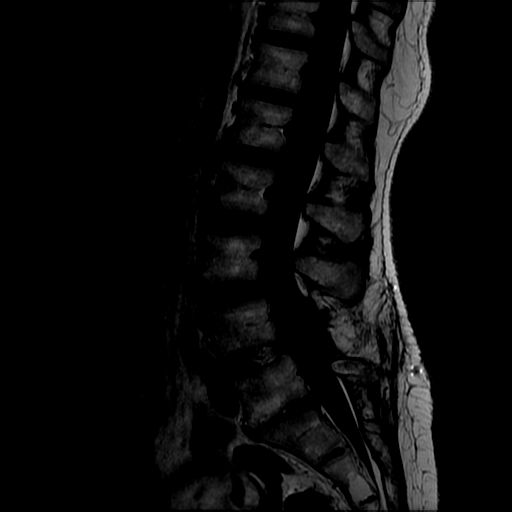
[im 15/15]
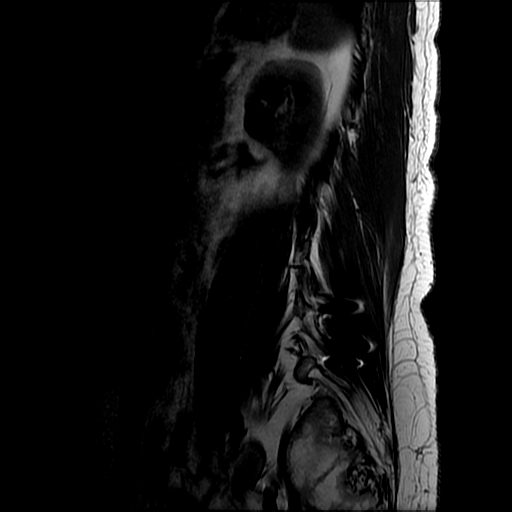

[Series 5: T2 · axial · 4.0mm · 0.39mm/px · z∈[-57,+154]mm · 8 of 37 slices shown]
[im 1/37]
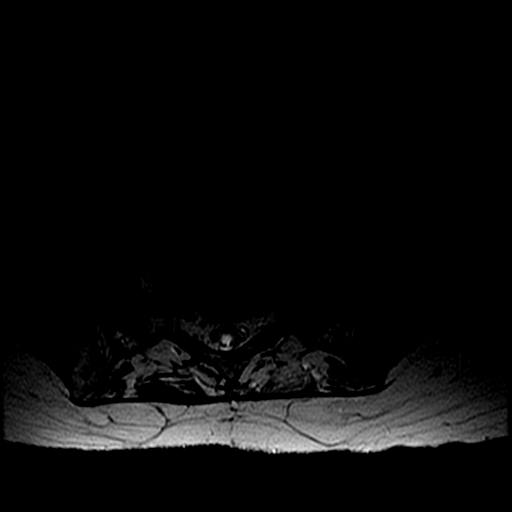
[im 5/37]
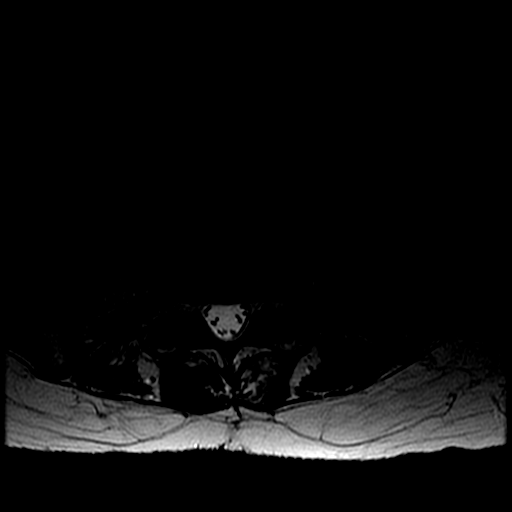
[im 13/37]
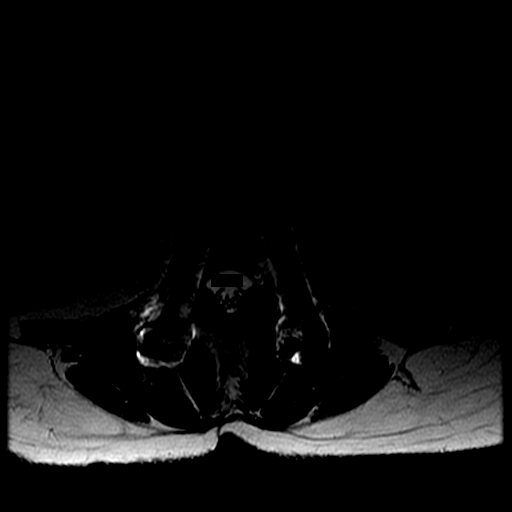
[im 17/37]
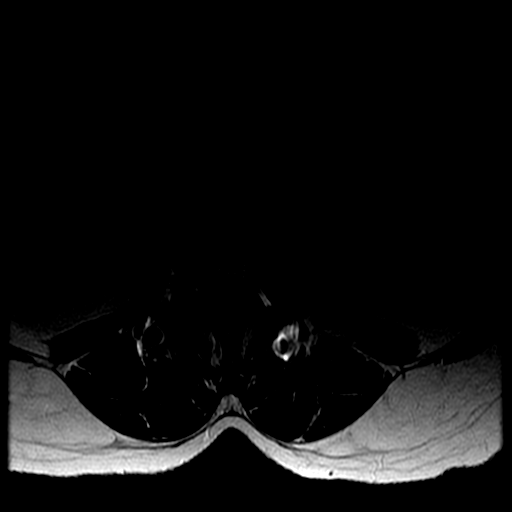
[im 21/37]
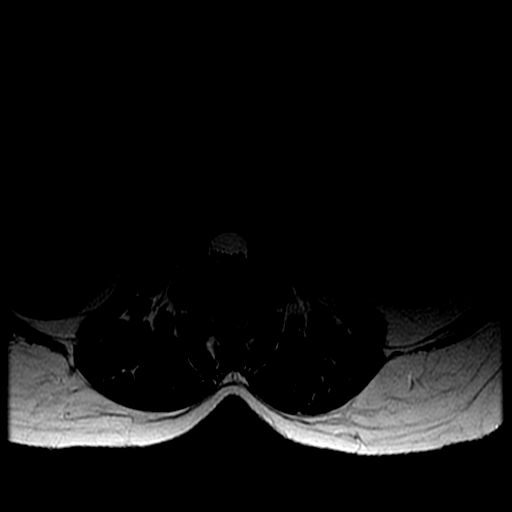
[im 25/37]
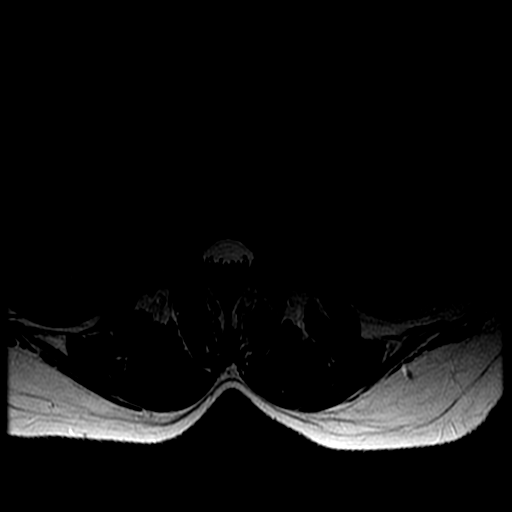
[im 33/37]
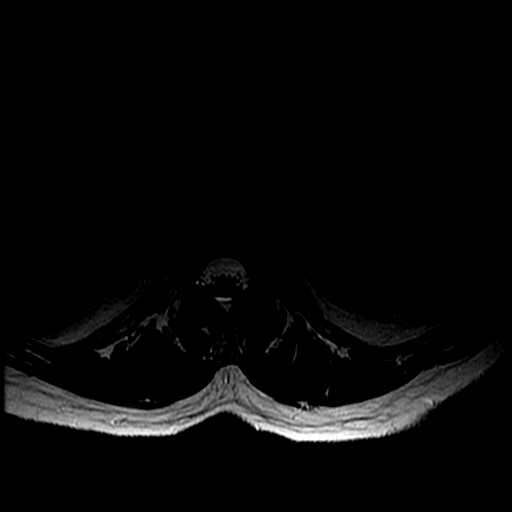
[im 37/37]
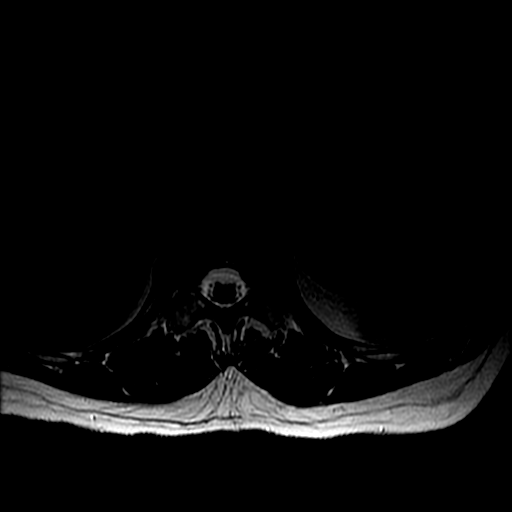

[Series 6: T1 · axial · 4.0mm · 0.39mm/px · z∈[-37,+134]mm · 3 of 37 slices shown (2 of 2)]
[im 5/37]
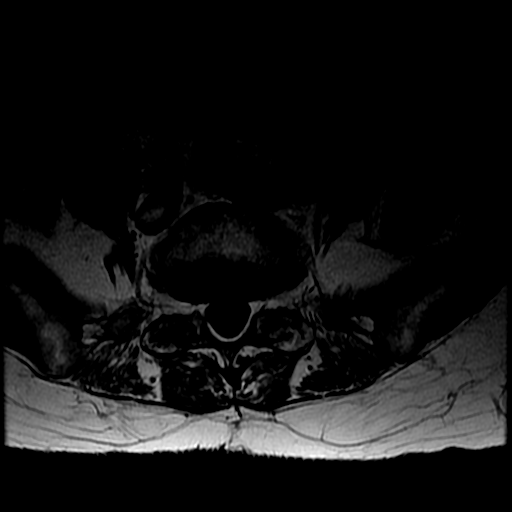
[im 21/37]
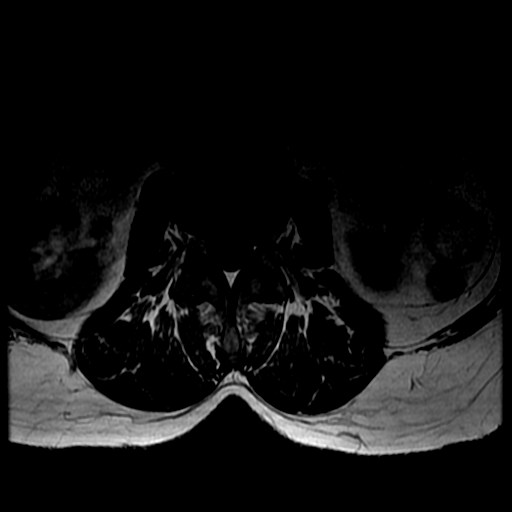
[im 33/37]
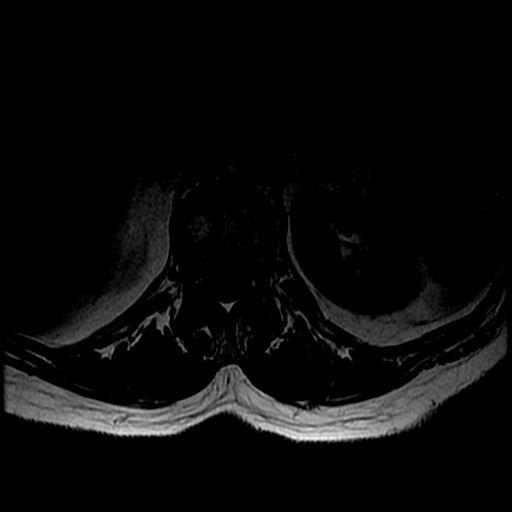

[Series 7: T2 post-contrast · sagittal · 4.0mm · 0.51mm/px · 4 of 15 slices shown]
[im 1/15]
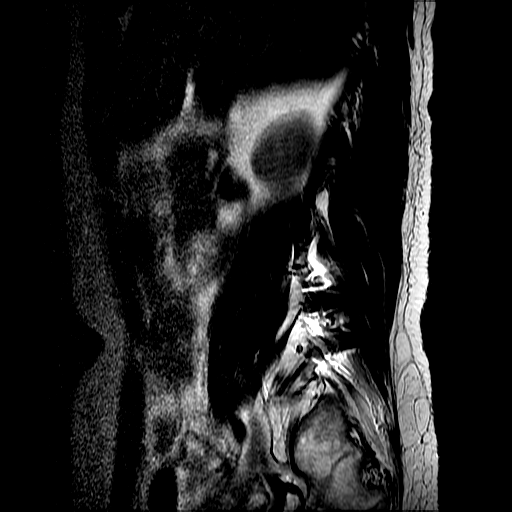
[im 5/15]
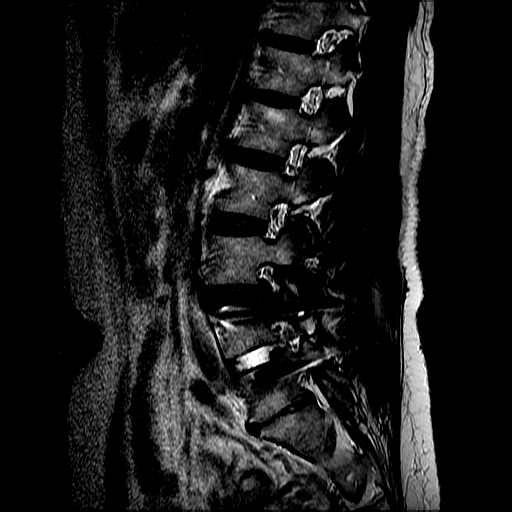
[im 10/15]
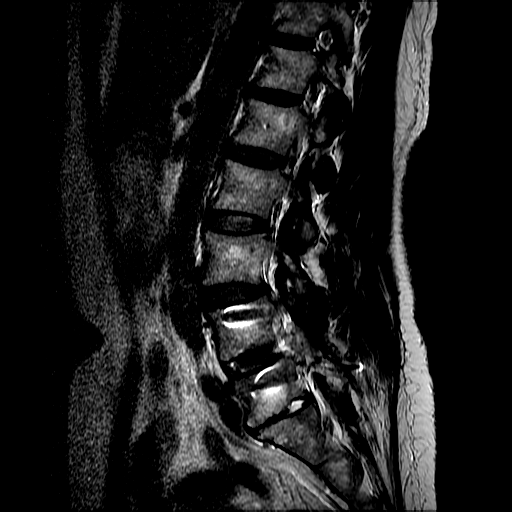
[im 15/15]
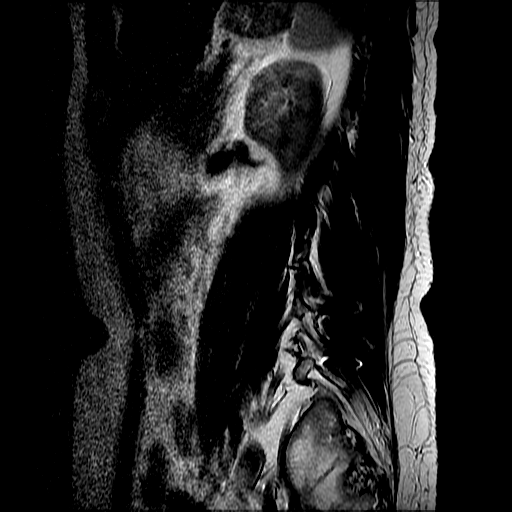

[19 of 48 positions shown; findings below may reference images not displayed]

FINDINGS: Segmentation:  5 lumbar type vertebral bodies.

Alignment:  2 mm anterolisthesis L3-4 in this position.

Vertebrae: Scattered benign appearing hemangiomas. No significant
primary bone finding.

Conus medullaris and cauda equina: Conus extends to the L1-2 level.
Conus and cauda equina appear normal.

Paraspinal and other soft tissues: Negative

Disc levels:

No abnormality at T11-12 or T12-L1.

L1-2: Chronic disc bulge.  No stenosis or neural compression.

L2-3: Normal interspace.

L3-4: Adjacent segment degenerative changes with bilateral facet
arthropathy with gaping, fluid-filled facet joints. 2 mm of
anterolisthesis which could worsen with standing or flexion. Bulging
of the disc. There is mild stenosis of the lateral recesses and
proximal foramina, which could also worsened.

L4-5: Previous PLIF has a good appearance. Wide patency of the canal
and foramina.

L5-S1: Chronic disc degeneration with loss of disc height. Bulging
of the disc but no compressive stenosis of the canal or foramina.
Possible fusion of the facet joints.
IMPRESSION: Good appearance at the PLIF level of L4-5. Probable fusion of the
facets at L5-S1. No stenosis at those levels.

Adjacent segment degenerative disease at L3-4. Facet arthropathy
with gaping, fluid-filled joints. 2 mm anterolisthesis. Bulging of
the disc. The anterolisthesis would likely worsen with standing or
flexion. Mild stenosis of the lateral recesses and proximal foramina
which would also likely worsen.

## 2018-05-03 ENCOUNTER — Other Ambulatory Visit: Payer: Self-pay | Admitting: Obstetrics & Gynecology

## 2018-05-03 DIAGNOSIS — Z1231 Encounter for screening mammogram for malignant neoplasm of breast: Secondary | ICD-10-CM

## 2018-05-07 MED FILL — CLOBETASOL PROPIONATE 0.05: 0.05 | 50 days supply | Qty: 50 | Fill #0

## 2018-05-12 MED FILL — SUBVENITE 200 MG TABS: 200 | 30 days supply | Qty: 30 | Fill #1

## 2018-05-12 MED FILL — ALPRAZolam 0.5 MG TABS: 0.5 | 30 days supply | Qty: 30 | Fill #0

## 2018-05-17 DIAGNOSIS — Z1589 Genetic susceptibility to other disease: Secondary | ICD-10-CM | POA: Diagnosis not present

## 2018-05-17 DIAGNOSIS — Z6821 Body mass index (BMI) 21.0-21.9, adult: Secondary | ICD-10-CM | POA: Diagnosis not present

## 2018-05-17 DIAGNOSIS — M7062 Trochanteric bursitis, left hip: Secondary | ICD-10-CM | POA: Diagnosis not present

## 2018-05-17 DIAGNOSIS — R102 Pelvic and perineal pain: Secondary | ICD-10-CM | POA: Diagnosis not present

## 2018-05-17 DIAGNOSIS — M150 Primary generalized (osteo)arthritis: Secondary | ICD-10-CM | POA: Diagnosis not present

## 2018-05-17 DIAGNOSIS — Z79899 Other long term (current) drug therapy: Secondary | ICD-10-CM | POA: Diagnosis not present

## 2018-05-17 DIAGNOSIS — M2550 Pain in unspecified joint: Secondary | ICD-10-CM | POA: Diagnosis not present

## 2018-05-17 DIAGNOSIS — M25552 Pain in left hip: Secondary | ICD-10-CM | POA: Diagnosis not present

## 2018-05-17 DIAGNOSIS — M0600 Rheumatoid arthritis without rheumatoid factor, unspecified site: Secondary | ICD-10-CM | POA: Diagnosis not present

## 2018-05-17 MED FILL — ENBREL 50 MG/ML SOSY: 50 | 28 days supply | Qty: 4 | Fill #1

## 2018-05-17 MED FILL — predniSONE 5 MG TABS: 5 | 12 days supply | Qty: 48 | Fill #0

## 2018-05-30 ENCOUNTER — Encounter: Payer: Self-pay | Admitting: Emergency Medicine

## 2018-05-30 DIAGNOSIS — F4310 Post-traumatic stress disorder, unspecified: Secondary | ICD-10-CM | POA: Insufficient documentation

## 2018-05-30 DIAGNOSIS — F411 Generalized anxiety disorder: Secondary | ICD-10-CM

## 2018-06-08 MED FILL — PAZEO 0.7% EYE DROPS: 0.7 | 30 days supply | Qty: 3 | Fill #0

## 2018-06-15 ENCOUNTER — Ambulatory Visit
Admission: RE | Admit: 2018-06-15 | Discharge: 2018-06-15 | Disposition: A | Discharge status: Home / Self Care | Payer: 59 | Source: Ambulatory Visit | Attending: Obstetrics & Gynecology | Admitting: Obstetrics & Gynecology

## 2018-06-15 ENCOUNTER — Ambulatory Visit: Payer: 59 | Admitting: Psychiatry

## 2018-06-15 DIAGNOSIS — Z1231 Encounter for screening mammogram for malignant neoplasm of breast: Secondary | ICD-10-CM

## 2018-06-15 DIAGNOSIS — F329 Major depressive disorder, single episode, unspecified: Secondary | ICD-10-CM

## 2018-06-15 DIAGNOSIS — F4310 Post-traumatic stress disorder, unspecified: Secondary | ICD-10-CM | POA: Diagnosis not present

## 2018-06-15 DIAGNOSIS — F411 Generalized anxiety disorder: Secondary | ICD-10-CM | POA: Diagnosis not present

## 2018-06-15 DIAGNOSIS — F32A Depression, unspecified: Secondary | ICD-10-CM

## 2018-06-15 MED ORDER — ALPRAZOLAM 0.5 MG PO TABS: 0.5000 mg | ORAL_TABLET | Freq: Every evening | ORAL | 2 refills | Status: DC | PRN

## 2018-06-15 MED ORDER — LAMOTRIGINE 200 MG PO TABS: 200.0000 mg | ORAL_TABLET | Freq: Every day | ORAL | 2 refills | Status: DC

## 2018-06-15 MED ORDER — FLUOXETINE HCL 40 MG PO CAPS: 40.0000 mg | ORAL_CAPSULE | Freq: Every day | ORAL | 2 refills | Status: DC

## 2018-06-15 MED FILL — FLUoxetine HCL 40 MG CAPS: 40 | 30 days supply | Qty: 30 | Fill #0

## 2018-06-15 MED FILL — ALPRAZolam 0.5 MG TABS: 0.5 | 30 days supply | Qty: 30 | Fill #0

## 2018-06-15 MED FILL — SUBVENITE 200 MG TABS: 200 | 30 days supply | Qty: 30 | Fill #0

## 2018-06-15 NOTE — Progress Notes (Signed)
[facility]  Patient ID: [name],  MRN: [id number]  PCP: Lucretia Kern, DO  Date of Evaluation: 06/15/2018 Time spent:20 minutes  Chief Complaint:   HISTORY/CURRENT STATUS: HPI patient seen 04/20/2018.  Diagnoses include anxiety with panic attacks, depression, PTSD.  Patient had a lot of stress at that time.  Her husband was found to have prostate cancer. Stress is worsening currently.  As mentioned her husband has prostate cancer.  Her daughter-in-law has rectal cancer.  In addition to increased stress she notices increased anxiety.  Some depression where she is irritable and angry, isolates, poor sleep.  No suicidal thoughts.   Individual Medical History/ Review of Systems: Changes? :No   Allergies: Aspartame and phenylalanine; Beef-derived products; Citrus; Penicillins; Promethazine hcl; Adhesive [tape]; Doxycycline; and Morphine sulfate  Current Medications:  Current Outpatient Medications:  .  ALPRAZolam (XANAX) 0.5 MG tablet, Take 1 tablet (0.5 mg total) by mouth at bedtime as needed for anxiety., Disp: 30 tablet, Rfl: 2 .  FLUoxetine (PROZAC) 40 MG capsule, Take 1 capsule (40 mg total) by mouth daily., Disp: 30 capsule, Rfl: 2 .  lamoTRIgine (LAMICTAL) 200 MG tablet, Take 1 tablet (200 mg total) by mouth daily., Disp: 30 tablet, Rfl: 2 .  CARAFATE 1 GM/10ML suspension, TAKE 10 MLS BY MOUTH 3 TIMES DAILY AS NEEDED FOR DIGESTION, Disp: 840 mL, Rfl: 5 .  cholecalciferol (VITAMIN D) 1000 UNITS tablet, Take 1,000 Units by mouth every evening., Disp: , Rfl:  .  clobetasol (TEMOVATE) 0.05 % external solution, Apply 1 application topically daily. As needed for seb derm., Disp: 50 mL, Rfl: 0 .  estradiol (VIVELLE-DOT) 0.075 MG/24HR, Place 1 patch onto the skin 2 (two) times a week. Wednesday and Sunday, Disp: , Rfl:  .  etanercept (ENBREL) 50 MG/ML injection, Inject 0.98 mLs (50 mg total) into the skin once a week. 1 ml weekly subcutaneous 30 days, Disp: 4 Syringe,  Rfl: 3 .  fluticasone (FLONASE) 50 MCG/ACT nasal spray, Place 2 sprays into the nose daily., Disp: 16 g, Rfl: 5 .  hydrocortisone (ANUSOL-HC) 25 MG suppository, One at bedtime for 5 nights then as needed, Disp: 24 suppository, Rfl: 1 .  meloxicam (MOBIC) 15 MG tablet, , Disp: , Rfl: 3 .  Multiple Vitamin (MULTIVITAMIN WITH MINERALS) TABS, Take 1 tablet by mouth every evening., Disp: , Rfl:  .  omeprazole (PRILOSEC) 20 MG capsule, TAKE 1 CAPSULE BY MOUTH ONCE DAILY, Disp: 90 capsule, Rfl: 3 .  XIIDRA 5 % SOLN, , Disp: , Rfl: 9 Medication Side Effects: none  Family Medical/ Social History: Changes?yes cancer husband and daughter in law  [place]:  There were no vitals taken for this visit.There is no height or weight on file to calculate BMI.  General Appearance: Casual  Eye Contact:  Good  Speech:  Normal Rate  Volume:  Normal  Mood:  Depressed  Affect:  Appropriate  Thought Process:  Goal Directed  Orientation:  Full (Time, Place, and Person)  Thought Content: Logical   Suicidal Thoughts:  No  Homicidal Thoughts:  No  Memory:  WNL  Judgement:  Good  Insight:  Good  Psychomotor Activity:  Normal  Concentration:  Concentration: Good  Recall:  Good  Fund of Knowledge: Good  Language: Good  Assets:  Desire for Improvement  ADL's:  Intact  Cognition: WNL  Prognosis:  Good    DIAGNOSES:    ICD-10-CM   1. Anxiety state F41.1   2. Depression, unspecified depression  type F32.9   3. PTSD (post-traumatic stress disorder) F43.10     Receiving Psychotherapy: No    RECOMMENDATIONS: Patient wishes to keep her medications the same.  Prozac 40 mg once a day, Lamictal 200 mg once a day, Xanax 0.5 mg 1 at bedtime and as needed.  I will see her again in 2 months she is to call if she has problems.   Comer Locket, PA-C

## 2018-06-23 DIAGNOSIS — Z6821 Body mass index (BMI) 21.0-21.9, adult: Secondary | ICD-10-CM | POA: Diagnosis not present

## 2018-06-23 DIAGNOSIS — Z01419 Encounter for gynecological examination (general) (routine) without abnormal findings: Secondary | ICD-10-CM | POA: Diagnosis not present

## 2018-06-23 MED FILL — ESTRADIOL 0.1 MG/24HR PTTW: 0.1 | 84 days supply | Qty: 24 | Fill #0

## 2018-07-01 DIAGNOSIS — F4323 Adjustment disorder with mixed anxiety and depressed mood: Secondary | ICD-10-CM | POA: Diagnosis not present

## 2018-07-06 ENCOUNTER — Encounter: Payer: Self-pay | Admitting: Internal Medicine

## 2018-07-06 ENCOUNTER — Ambulatory Visit: Payer: Self-pay

## 2018-07-06 ENCOUNTER — Ambulatory Visit: Payer: 59 | Admitting: Internal Medicine

## 2018-07-06 VITALS — BP 130/84 | HR 96 | Temp 98.3°F | Wt 119.6 lb

## 2018-07-06 DIAGNOSIS — J0140 Acute pansinusitis, unspecified: Secondary | ICD-10-CM

## 2018-07-06 MED ORDER — CEFUROXIME AXETIL 500 MG PO TABS: 500.0000 mg | ORAL_TABLET | Freq: Two times a day (BID) | ORAL | 0 refills | Status: DC

## 2018-07-06 MED FILL — CEFUROXIME AXETIL 500 MG TA: 500 | 7 days supply | Qty: 14 | Fill #0

## 2018-07-06 NOTE — Progress Notes (Signed)
Chief Complaint  Patient presents with  . Cough    10 days productive green mucus. no sore throat sinus pressure and headache    HPI: [name] 62 y.o. come in for SDA   PCP  appt NA sent in [facility] nurese triage for  Sinus pain and congestion for  10 days  With no relief from  otcs  Onset  Like  A  st and head   Congestion and now continuing without improvement      Thick green dc .  And  cheecks and forehead tender    Cough is still there  And  Hoarse.   tried  otc no help.   flonase saline etc ROS: See pertinent positives and negatives per HPI. No cp sob syncope  Vision changes   Past Medical History:  Diagnosis Date  . Allergy   . Anemia    after birth of child 72yrs ago  . Anxiety and depression    with insomnia  . Arthritis    uses Diclofenac gel;Rheumatoid  . Blood transfusion without reported diagnosis    x3  . Chronic back pain    ? RA, ? Lupus - reports saw rheumatologist in the past but better now, sees Dr. Trenton Gammon  . Eczema   . GERD (gastroesophageal reflux disease)    takes Omeprazole daily, with nausea  . Heart murmur   . Hemorrhoids   . History of bronchitis    winter of 2014  . History of kidney stones    passed on her own  . History of migraine    last one a yr ago and takes Zomig prn  . Hot flashes 11/10/2014  . HTN (hypertension) 03/31/2012  . Hx of echocardiogram    Echo (8/15):  EF 55-60%, no RWMA  . IBS (irritable bowel syndrome)    constipation predominant  . IBS (irritable bowel syndrome)   . Interstitial cystitis    hx of UTI  . Numbness    both legs and related to back  . Osteopenia   . Osteoporosis    osteopenia  . Palpitations    on metoprolol in past but made BP too low  . Pneumonia    hx of;last time 19yrs ago  . PONV (postoperative nausea and vomiting)    laryngospasm-1999  . Raynaud's disease /phenomenon 10/21/2012  . Seasonal allergies    Flonase daily     Family History  Problem Relation Age of Onset  . Coronary  artery disease Father   . Heart attack Father   . Hypertension Father   . Depression Father   . Hyperlipidemia Father   . Diverticulosis Mother   . Hypertension Mother   . Osteoarthritis Mother   . Depression Mother   . Diabetes Paternal Uncle   . Hypertension Maternal Grandmother   . Stroke Maternal Grandmother   . Hypertension Maternal Grandfather   . Cancer Maternal Grandfather   . Stroke Paternal Grandfather   . Hyperlipidemia Paternal Grandfather   . Depression Paternal Grandmother   . Dementia Paternal Grandmother   . Depression Sister   . Colon cancer Neg Hx     Social History   Socioeconomic History  . Marital status: Married    Spouse name: Not on file  . Number of children: Not on file  . Years of education: Not on file  . Highest education level: Not on file  Occupational History  . Occupation: Programmer, multimedia: [employer]  Social Needs  . Financial resource strain: Not on file  . Food insecurity:    Worry: Not on file    Inability: Not on file  . Transportation needs:    Medical: Not on file    Non-medical: Not on file  Tobacco Use  . Smoking status: Never Smoker  . Smokeless tobacco: Never Used  Substance and Sexual Activity  . Alcohol use: Yes    Alcohol/week: 2.0 standard drinks    Types: 1 Standard drinks or equivalent, 1 Shots of liquor per week    Comment: one margarita a week  . Drug use: No  . Sexual activity: Yes  Lifestyle  . Physical activity:    Days per week: Not on file    Minutes per session: Not on file  . Stress: Not on file  Relationships  . Social connections:    Talks on phone: Not on file    Gets together: Not on file    Attends religious service: Not on file    Active member of club or organization: Not on file    Attends meetings of clubs or organizations: Not on file    Relationship status: Not on file  Other Topics Concern  . Not on file  Social History Narrative   Work or School: Therapist, sports at [employer]  Situation: husband committed suicide      Spiritual Beliefs: methodist      Lifestyle: no regular exercise; diet is healthy       Outpatient Medications Prior to Visit  Medication Sig Dispense Refill  . ALPRAZolam (XANAX) 0.5 MG tablet Take 1 tablet (0.5 mg total) by mouth at bedtime as needed for anxiety. 30 tablet 2  . CARAFATE 1 GM/10ML suspension TAKE 10 MLS BY MOUTH 3 TIMES DAILY AS NEEDED FOR DIGESTION 840 mL 5  . cholecalciferol (VITAMIN D) 1000 UNITS tablet Take 1,000 Units by mouth every evening.    . clobetasol (TEMOVATE) 0.05 % external solution Apply 1 application topically daily. As needed for seb derm. 50 mL 0  . estradiol (VIVELLE-DOT) 0.075 MG/24HR Place 1 patch onto the skin 2 (two) times a week. Wednesday and Sunday    . etanercept (ENBREL) 50 MG/ML injection Inject 0.98 mLs (50 mg total) into the skin once a week. 1 ml weekly subcutaneous 30 days 4 Syringe 3  . FLUoxetine (PROZAC) 40 MG capsule Take 1 capsule (40 mg total) by mouth daily. 30 capsule 2  . fluticasone (FLONASE) 50 MCG/ACT nasal spray Place 2 sprays into the nose daily. 16 g 5  . lamoTRIgine (LAMICTAL) 200 MG tablet Take 1 tablet (200 mg total) by mouth daily. 30 tablet 2  . Multiple Vitamin (MULTIVITAMIN WITH MINERALS) TABS Take 1 tablet by mouth every evening.    Marland Kitchen PAZEO 0.7 % SOLN   98  . hydrocortisone (ANUSOL-HC) 25 MG suppository One at bedtime for 5 nights then as needed 24 suppository 1  . meloxicam (MOBIC) 15 MG tablet   3  . omeprazole (PRILOSEC) 20 MG capsule TAKE 1 CAPSULE BY MOUTH ONCE DAILY 90 capsule 3  . XIIDRA 5 % SOLN   9   No facility-administered medications prior to visit.      EXAM:  BP 130/84 (BP Location: Right Arm, Patient Position: Sitting, Cuff Size: Normal)   Pulse 96   Temp 98.3 F (36.8 C) (Oral)   Wt 119 lb 9.6 oz (54.3 kg)   SpO2 96%   BMI 21.70  kg/m   Body mass index is 21.7 kg/m. WDWN in NAD  quiet respirations; very  congested  somewhat hoarse. Non toxic .  ocass cough  HEENT: Normocephalic ;atraumatic , Eyes;  PERRL, EOMs  Full, lids and conjunctiva clear,,Ears: no deformities, canals nl, TM landmarks normal, Nose: no deformity mucoid anvnd 3+ congested tedner  Right more than left maxillary and frontal  tender Mouth : OP clear without lesion or edema .cobblestoning  Neck: Supple without adenopathy or masses or bruits Chest:  Clear to A&P without wheezes rales or rhonchi CV:  S1-S2 no gallops or murmurs peripheral perfusion is normal Skin :nl perfusion and no acute rashes     BP Readings from Last 3 Encounters:  07/06/18 130/84  04/20/18 108/72  04/09/17 110/80    ASSESSMENT AND PLAN:  Discussed the following assessment and plan:  Acute pansinusitis, recurrence not specified Protracted uri and  Unresponsive sx to  conservative measures    Hx hives with pcn but record review shows  omnicef rx in past    And Benefit more than risk of medication advised to patient    Vs azithro and quinolone   Expectant management. And self care and fu  Prn. -Patient advised to return or notify health care team  if  new concerns arise.  Patient Instructions  Treating for bacterial sinusitis.   Continue saline nose spray warm compresses.  Try mucinex dm for cough.  You have had  Given omnicef  in past and   No reaction  noted there is a  Very small  Risk of cross reaction  To allergy to pcn. .    Sinusitis, Adult Sinusitis is inflammation of your sinuses. Sinuses are hollow spaces in the bones around your face. Your sinuses are located:  Around your eyes.  In the middle of your forehead.  Behind your nose.  In your cheekbones. Mucus normally drains out of your sinuses. When your nasal tissues become inflamed or swollen, mucus can become trapped or blocked. This allows bacteria, viruses, and fungi to grow, which leads to infection. Most infections of the sinuses are caused by a virus. Sinusitis can develop quickly. It can last for up to 4 weeks  (acute) or for more than 12 weeks (chronic). Sinusitis often develops after a cold. What are the causes? This condition is caused by anything that creates swelling in the sinuses or stops mucus from draining. This includes:  Allergies.  Asthma.  Infection from bacteria or viruses.  Deformities or blockages in your nose or sinuses.  Abnormal growths in the nose (nasal polyps).  Pollutants, such as chemicals or irritants in the air.  Infection from fungi (rare). What increases the risk? You are more likely to develop this condition if you:  Have a weak body defense system (immune system).  Do a lot of swimming or diving.  Overuse nasal sprays.  Smoke. What are the signs or symptoms? The main symptoms of this condition are pain and a feeling of pressure around the affected sinuses. Other symptoms include:  Stuffy nose or congestion.  Thick drainage from your nose.  Swelling and warmth over the affected sinuses.  Headache.  Upper toothache.  A cough that may get worse at night.  Extra mucus that collects in the throat or the back of the nose (postnasal drip).  Decreased sense of smell and taste.  Fatigue.  A fever.  Sore throat.  Bad breath. How is this diagnosed? This condition is diagnosed based on:  Your  symptoms.  Your medical history.  A physical exam.  Tests to find out if your condition is acute or chronic. This may include: ? Checking your nose for nasal polyps. ? Viewing your sinuses using a device that has a light (endoscope). ? Testing for allergies or bacteria. ? Imaging tests, such as an MRI or CT scan. In rare cases, a bone biopsy may be done to rule out more serious types of fungal sinus disease. How is this treated? Treatment for sinusitis depends on the cause and whether your condition is chronic or acute.  If caused by a virus, your symptoms should go away on their own within 10 days. You may be given medicines to relieve symptoms.  They include: ? Medicines that shrink swollen nasal passages (topical intranasal decongestants). ? Medicines that treat allergies (antihistamines). ? A spray that eases inflammation of the nostrils (topical intranasal corticosteroids). ? Rinses that help get rid of thick mucus in your nose (nasal saline washes).  If caused by bacteria, your health care provider may recommend waiting to see if your symptoms improve. Most bacterial infections will get better without antibiotic medicine. You may be given antibiotics if you have: ? A severe infection. ? A weak immune system.  If caused by narrow nasal passages or nasal polyps, you may need to have surgery. Follow these instructions at home: Medicines  Take, use, or apply over-the-counter and prescription medicines only as told by your health care provider. These may include nasal sprays.  If you were prescribed an antibiotic medicine, take it as told by your health care provider. Do not stop taking the antibiotic even if you start to feel better. Hydrate and humidify   Drink enough fluid to keep your urine pale yellow. Staying hydrated will help to thin your mucus.  Use a cool mist humidifier to keep the humidity level in your home above 50%.  Inhale steam for 10-15 minutes, 3-4 times a day, or as told by your health care provider. You can do this in the bathroom while a hot shower is running.  Limit your exposure to cool or dry air. Rest  Rest as much as possible.  Sleep with your head raised (elevated).  Make sure you get enough sleep each night. General instructions   Apply a warm, moist washcloth to your face 3-4 times a day or as told by your health care provider. This will help with discomfort.  Wash your hands often with soap and water to reduce your exposure to germs. If soap and water are not available, use hand sanitizer.  Do not smoke. Avoid being around people who are smoking (secondhand smoke).  Keep all follow-up  visits as told by your health care provider. This is important. Contact a health care provider if:  You have a fever.  Your symptoms get worse.  Your symptoms do not improve within 10 days. Get help right away if:  You have a severe headache.  You have persistent vomiting.  You have severe pain or swelling around your face or eyes.  You have vision problems.  You develop confusion.  Your neck is stiff.  You have trouble breathing. Summary  Sinusitis is soreness and inflammation of your sinuses. Sinuses are hollow spaces in the bones around your face.  This condition is caused by nasal tissues that become inflamed or swollen. The swelling traps or blocks the flow of mucus. This allows bacteria, viruses, and fungi to grow, which leads to infection.  If you were  prescribed an antibiotic medicine, take it as told by your health care provider. Do not stop taking the antibiotic even if you start to feel better.  Keep all follow-up visits as told by your health care provider. This is important. This information is not intended to replace advice given to you by your health care provider. Make sure you discuss any questions you have with your health care provider. Document Released: 06/30/2005 Document Revised: 11/30/2017 Document Reviewed: 11/30/2017 Elsevier Interactive Patient Education  2019 [place]. Rondrick Barreira M.D.

## 2018-07-06 NOTE — Patient Instructions (Addendum)
Treating for bacterial sinusitis.   Continue saline nose spray warm compresses.  Try mucinex dm for cough.  You have had  Given omnicef  in past and   No reaction  noted there is a  Very small  Risk of cross reaction  To allergy to pcn. .    Sinusitis, Adult Sinusitis is inflammation of your sinuses. Sinuses are hollow spaces in the bones around your face. Your sinuses are located:  Around your eyes.  In the middle of your forehead.  Behind your nose.  In your cheekbones. Mucus normally drains out of your sinuses. When your nasal tissues become inflamed or swollen, mucus can become trapped or blocked. This allows bacteria, viruses, and fungi to grow, which leads to infection. Most infections of the sinuses are caused by a virus. Sinusitis can develop quickly. It can last for up to 4 weeks (acute) or for more than 12 weeks (chronic). Sinusitis often develops after a cold. What are the causes? This condition is caused by anything that creates swelling in the sinuses or stops mucus from draining. This includes:  Allergies.  Asthma.  Infection from bacteria or viruses.  Deformities or blockages in your nose or sinuses.  Abnormal growths in the nose (nasal polyps).  Pollutants, such as chemicals or irritants in the air.  Infection from fungi (rare). What increases the risk? You are more likely to develop this condition if you:  Have a weak body defense system (immune system).  Do a lot of swimming or diving.  Overuse nasal sprays.  Smoke. What are the signs or symptoms? The main symptoms of this condition are pain and a feeling of pressure around the affected sinuses. Other symptoms include:  Stuffy nose or congestion.  Thick drainage from your nose.  Swelling and warmth over the affected sinuses.  Headache.  Upper toothache.  A cough that may get worse at night.  Extra mucus that collects in the throat or the back of the nose (postnasal drip).  Decreased sense  of smell and taste.  Fatigue.  A fever.  Sore throat.  Bad breath. How is this diagnosed? This condition is diagnosed based on:  Your symptoms.  Your medical history.  A physical exam.  Tests to find out if your condition is acute or chronic. This may include: ? Checking your nose for nasal polyps. ? Viewing your sinuses using a device that has a light (endoscope). ? Testing for allergies or bacteria. ? Imaging tests, such as an MRI or CT scan. In rare cases, a bone biopsy may be done to rule out more serious types of fungal sinus disease. How is this treated? Treatment for sinusitis depends on the cause and whether your condition is chronic or acute.  If caused by a virus, your symptoms should go away on their own within 10 days. You may be given medicines to relieve symptoms. They include: ? Medicines that shrink swollen nasal passages (topical intranasal decongestants). ? Medicines that treat allergies (antihistamines). ? A spray that eases inflammation of the nostrils (topical intranasal corticosteroids). ? Rinses that help get rid of thick mucus in your nose (nasal saline washes).  If caused by bacteria, your health care provider may recommend waiting to see if your symptoms improve. Most bacterial infections will get better without antibiotic medicine. You may be given antibiotics if you have: ? A severe infection. ? A weak immune system.  If caused by narrow nasal passages or nasal polyps, you may need to have surgery.  Follow these instructions at home: Medicines  Take, use, or apply over-the-counter and prescription medicines only as told by your health care provider. These may include nasal sprays.  If you were prescribed an antibiotic medicine, take it as told by your health care provider. Do not stop taking the antibiotic even if you start to feel better. Hydrate and humidify   Drink enough fluid to keep your urine pale yellow. Staying hydrated will help to  thin your mucus.  Use a cool mist humidifier to keep the humidity level in your home above 50%.  Inhale steam for 10-15 minutes, 3-4 times a day, or as told by your health care provider. You can do this in the bathroom while a hot shower is running.  Limit your exposure to cool or dry air. Rest  Rest as much as possible.  Sleep with your head raised (elevated).  Make sure you get enough sleep each night. General instructions   Apply a warm, moist washcloth to your face 3-4 times a day or as told by your health care provider. This will help with discomfort.  Wash your hands often with soap and water to reduce your exposure to germs. If soap and water are not available, use hand sanitizer.  Do not smoke. Avoid being around people who are smoking (secondhand smoke).  Keep all follow-up visits as told by your health care provider. This is important. Contact a health care provider if:  You have a fever.  Your symptoms get worse.  Your symptoms do not improve within 10 days. Get help right away if:  You have a severe headache.  You have persistent vomiting.  You have severe pain or swelling around your face or eyes.  You have vision problems.  You develop confusion.  Your neck is stiff.  You have trouble breathing. Summary  Sinusitis is soreness and inflammation of your sinuses. Sinuses are hollow spaces in the bones around your face.  This condition is caused by nasal tissues that become inflamed or swollen. The swelling traps or blocks the flow of mucus. This allows bacteria, viruses, and fungi to grow, which leads to infection.  If you were prescribed an antibiotic medicine, take it as told by your health care provider. Do not stop taking the antibiotic even if you start to feel better.  Keep all follow-up visits as told by your health care provider. This is important. This information is not intended to replace advice given to you by your health care provider.  Make sure you discuss any questions you have with your health care provider. Document Released: 06/30/2005 Document Revised: 11/30/2017 Document Reviewed: 11/30/2017 Elsevier Interactive Patient Education  2019 [employer].

## 2018-07-06 NOTE — Telephone Encounter (Signed)
FYI

## 2018-07-06 NOTE — Telephone Encounter (Signed)
Pt called to say she has had sinus pressure and pain for 10 days.  She has treated her symptoms with OTC decongestants but they have not helped.  She has pain behind her eyes and under her eyes.She is unable to breath out of her nose because of the congestion She denies fever.  She has a cough and green discharge from her nose. Appointment scheduled per protocol.  Care advice read to patient.  Patient verbalized understanding of all instructions.  Reason for Disposition . [1] Sinus congestion (pressure, fullness) AND [2] present > 10 days  Answer Assessment - Initial Assessment Questions 1. LOCATION: "Where does it hurt?"      Eyes and below 2. ONSET: "When did the sinus pain start?"  (e.g., hours, days)      10 days ago 3. SEVERITY: "How bad is the pain?"   (Scale 1-10; mild, moderate or severe)   - MILD (1-3): doesn't interfere with normal activities    - MODERATE (4-7): interferes with normal activities (e.g., work or school) or awakens from sleep   - SEVERE (8-10): excruciating pain and patient unable to do any normal activities        5 4. RECURRENT SYMPTOM: "Have you ever had sinus problems before?" If so, ask: "When was the last time?" and "What happened that time?"      no 5. NASAL CONGESTION: "Is the nose blocked?" If so, ask, "Can you open it or must you breathe through the mouth?"     Must breath through mouth 6. NASAL DISCHARGE: "Do you have discharge from your nose?" If so ask, "What color?"     green 7. FEVER: "Do you have a fever?" If so, ask: "What is it, how was it measured, and when did it start?"      no 8. OTHER SYMPTOMS: "Do you have any other symptoms?" (e.g., sore throat, cough, earache, difficulty breathing)     Cough 9. PREGNANCY: "Is there any chance you are pregnant?" "When was your last menstrual period?"     No  Protocols used: SINUS PAIN OR CONGESTION-A-AH

## 2018-07-21 DIAGNOSIS — F4323 Adjustment disorder with mixed anxiety and depressed mood: Secondary | ICD-10-CM | POA: Diagnosis not present

## 2018-07-22 MED FILL — ENBREL 50 MG/ML SOSY: 50 | 28 days supply | Qty: 4 | Fill #2

## 2018-07-26 ENCOUNTER — Other Ambulatory Visit: Payer: Self-pay | Admitting: Family Medicine

## 2018-07-26 MED FILL — lamoTRIgine 200 MG TABS: 200 | 30 days supply | Qty: 30 | Fill #1

## 2018-08-11 ENCOUNTER — Ambulatory Visit: Payer: 59 | Admitting: Psychiatry

## 2018-08-11 DIAGNOSIS — F4310 Post-traumatic stress disorder, unspecified: Secondary | ICD-10-CM

## 2018-08-11 DIAGNOSIS — F39 Unspecified mood [affective] disorder: Secondary | ICD-10-CM

## 2018-08-11 MED ORDER — FLUOXETINE HCL 40 MG PO CAPS: 40.0000 mg | ORAL_CAPSULE | Freq: Every day | ORAL | 2 refills | Status: DC

## 2018-08-11 MED ORDER — LAMOTRIGINE 200 MG PO TABS: 200.0000 mg | ORAL_TABLET | Freq: Every day | ORAL | 2 refills | Status: DC

## 2018-08-11 MED ORDER — ALPRAZOLAM 0.5 MG PO TABS: 0.5000 mg | ORAL_TABLET | Freq: Every evening | ORAL | 2 refills | Status: DC | PRN

## 2018-08-11 MED FILL — FLUoxetine HCL 40 MG CAPS: 40 | 30 days supply | Qty: 30 | Fill #0

## 2018-08-11 MED FILL — ALPRAZolam 0.5 MG TABS: 0.5 | 30 days supply | Qty: 30 | Fill #0

## 2018-08-11 NOTE — Progress Notes (Signed)
[facility]  Patient ID: [name],  MRN: [id number]  PCP: Lucretia Kern, DO  Date of Evaluation: 08/11/2018 w Chief Complaint:   HISTORY/CURRENT STATUS: HPI patient seen 06/15/2018.  Diagnoses include anxiety with panic attacks, depression, PTSD. She has had increase in stress at both her husband and daughter-in-law have cancer.  Patient wanted to  remain on the same medications.  These are Prozac 40 mg a day Lamictal 200 mg a day and Xanax 0.5 mg 1 at bedtime and PRN.  Individual Medical History/ Review of Systems: Changes? :No   Allergies: Aspartame and phenylalanine; Beef-derived products; Citrus; Penicillins; Promethazine hcl; Adhesive [tape]; Doxycycline; and Morphine sulfate  Current Medications:  Current Outpatient Medications:  .  ALPRAZolam (XANAX) 0.5 MG tablet, Take 1 tablet (0.5 mg total) by mouth at bedtime as needed for anxiety., Disp: 30 tablet, Rfl: 2 .  CARAFATE 1 GM/10ML suspension, TAKE 10 MLS BY MOUTH 3 TIMES DAILY AS NEEDED FOR DIGESTION, Disp: 840 mL, Rfl: 5 .  cefUROXime (CEFTIN) 500 MG tablet, Take 1 tablet (500 mg total) by mouth 2 (two) times daily with a meal. For sinusitis, Disp: 14 tablet, Rfl: 0 .  cholecalciferol (VITAMIN D) 1000 UNITS tablet, Take 1,000 Units by mouth every evening., Disp: , Rfl:  .  clobetasol (TEMOVATE) 0.05 % external solution, APPLY 1 APPLICATION TOPICALLY DAILY AS NEEDED FOR DERMATITIS, Disp: 50 mL, Rfl: 1 .  estradiol (VIVELLE-DOT) 0.075 MG/24HR, Place 1 patch onto the skin 2 (two) times a week. Wednesday and Sunday, Disp: , Rfl:  .  etanercept (ENBREL) 50 MG/ML injection, Inject 0.98 mLs (50 mg total) into the skin once a week. 1 ml weekly subcutaneous 30 days, Disp: 4 Syringe, Rfl: 3 .  FLUoxetine (PROZAC) 40 MG capsule, Take 1 capsule (40 mg total) by mouth daily., Disp: 30 capsule, Rfl: 2 .  fluticasone (FLONASE) 50 MCG/ACT nasal spray, Place 2 sprays into the nose daily., Disp: 16 g, Rfl: 5 .  lamoTRIgine  (LAMICTAL) 200 MG tablet, Take 1 tablet (200 mg total) by mouth daily., Disp: 30 tablet, Rfl: 2 .  Multiple Vitamin (MULTIVITAMIN WITH MINERALS) TABS, Take 1 tablet by mouth every evening., Disp: , Rfl:  .  PAZEO 0.7 % SOLN, , Disp: , Rfl: 98 Medication Side Effects: none  Family Medical/ Social History: Changes? No  MENTAL HEALTH EXAM:  There were no vitals taken for this visit.There is no height or weight on file to calculate BMI.  General Appearance: Casual  Eye Contact:  Good  Speech:  Normal Rate  Volume:  Decreased  Mood:  Depressed  Affect:  Appropriate  Thought Process:  Linear  Orientation:  Full (Time, Place, and Person)  Thought Content: Logical   Suicidal Thoughts:  No  Homicidal Thoughts:  No  Memory:  normal  Judgement:  Good  Insight:  Good  Psychomotor Activity:  Normal  Concentration:  Concentration: Good  Recall:  Good  Fund of Knowledge: Good  Language: Good  Assets:  Desire for Improvement  ADL's:  Intact  Cognition: WNL  Prognosis:  Good    DIAGNOSES: No diagnosis found.  Receiving Psychotherapy: No    RECOMMENDATIONS: Patient wants to remain on the same medications which are Prozac 40 mg a day, Lamictal 200 mg a day, Xanax 2.5 mg 1 a day and 1 as needed.  She also has tried melatonin for sleep.  She is having problems with sleep maintenance. Her husband has  prostate surgery in a week which  she is very stressed about this.  Patient is to return in 1 month.  If she has problems she can call  then and we can  talk over the phone.   Comer Locket, PA-C

## 2018-08-13 DIAGNOSIS — F4323 Adjustment disorder with mixed anxiety and depressed mood: Secondary | ICD-10-CM | POA: Diagnosis not present

## 2018-08-18 ENCOUNTER — Ambulatory Visit: Payer: 59 | Admitting: Psychiatry

## 2018-09-07 ENCOUNTER — Ambulatory Visit: Payer: 59 | Admitting: Psychiatry

## 2018-09-08 MED FILL — lamoTRIgine 200 MG TABS: 200 | 30 days supply | Qty: 30 | Fill #2

## 2018-09-22 ENCOUNTER — Ambulatory Visit: Payer: 59 | Admitting: Psychiatry

## 2018-09-27 ENCOUNTER — Other Ambulatory Visit: Payer: Self-pay | Admitting: Psychiatry

## 2018-09-27 MED FILL — SUBVENITE 200 MG TABS: 200 | 90 days supply | Qty: 90 | Fill #0

## 2018-10-04 ENCOUNTER — Ambulatory Visit: Payer: 59 | Admitting: Psychiatry

## 2018-10-06 DIAGNOSIS — L814 Other melanin hyperpigmentation: Secondary | ICD-10-CM | POA: Diagnosis not present

## 2018-10-06 DIAGNOSIS — L718 Other rosacea: Secondary | ICD-10-CM | POA: Diagnosis not present

## 2018-10-06 DIAGNOSIS — D1801 Hemangioma of skin and subcutaneous tissue: Secondary | ICD-10-CM | POA: Diagnosis not present

## 2018-10-06 DIAGNOSIS — L821 Other seborrheic keratosis: Secondary | ICD-10-CM | POA: Diagnosis not present

## 2018-10-06 DIAGNOSIS — L218 Other seborrheic dermatitis: Secondary | ICD-10-CM | POA: Diagnosis not present

## 2018-10-06 MED FILL — metroNIDAZOLE 0.75 % LOTN: 0.75 | 30 days supply | Qty: 59 | Fill #0

## 2018-10-06 MED FILL — CLOBETASOL PROPIONATE 0.05: 0.05 | 30 days supply | Qty: 50 | Fill #0

## 2018-10-16 MED FILL — ALPRAZolam 0.5 MG TABS: 0.5 | 30 days supply | Qty: 30 | Fill #1

## 2018-11-02 ENCOUNTER — Telehealth: Payer: Self-pay | Admitting: Psychiatry

## 2018-11-02 NOTE — Telephone Encounter (Signed)
Pt left v-mail. Can't sleep very well. A lot of stress. Laid off. Ask Lissa Hoard to return her call. [phone]

## 2018-11-03 NOTE — Telephone Encounter (Signed)
Pt. Stated that falling asleep is hard and staying asleep. Lamotrigine 200 Mg HS and Alprazolam  0.5 Mg HS are her current medications.

## 2018-11-03 NOTE — Telephone Encounter (Signed)
PDMP is clear.  Okay to increase alprazolam to 0.5 mg tablets 1-1/2-2 nightly.  Do not exceed 2 nightly.  If that does not work call us back.

## 2018-11-04 NOTE — Telephone Encounter (Signed)
She's aware and verbalized understanding. Has plenty of medication, instructed to call back when she needs a refill and let us know how it's working.

## 2018-11-15 ENCOUNTER — Telehealth: Payer: Self-pay | Admitting: Psychiatry

## 2018-11-15 NOTE — Telephone Encounter (Signed)
We will get paper chart to review her med history.  Have sent a note to admin staff to schedule her with Dr. Creig Hines for follow-up as soon as possible.  If she is not been on trazodone before that would be the obvious next step.

## 2018-11-15 NOTE — Telephone Encounter (Signed)
From chart review past meds potential use for sleep include amitriptyline, Xanax, Ambien, Lunesta, mirtazapine, trazodone, melatonin, Seroquel.  Other past psych meds include fluoxetine, lamotrigine, buspirone, venlafaxine, gabapentin.  She has a history of PTSD, depression, anxiety, and insomnia.  She might be a candidate for doxazosin or prazosin  Please asked the patient if #1 she is having nightmares?  #2 did any of the above meds help her sleep?  #3 how much alprazolam is she taking at night no?  She needs to be scheduled with someone for follow-up as soon as possible.  Schedule her with Dr. Creig Hines.

## 2018-11-15 NOTE — Telephone Encounter (Signed)
[name] called stating she is still having anxiety and unable to sleep. She has tweaked medication as ordered without relief. Please call to discuss other options.

## 2018-11-17 ENCOUNTER — Other Ambulatory Visit: Payer: Self-pay | Admitting: Psychiatry

## 2018-11-17 MED ORDER — MIRTAZAPINE 15 MG PO TABS: 15.0000 mg | ORAL_TABLET | Freq: Every day | ORAL | 0 refills | Status: DC

## 2018-11-17 MED FILL — MIRTAZAPINE 15 MG TABLET: 15 | 30 days supply | Qty: 30 | Fill #0

## 2018-11-17 NOTE — Telephone Encounter (Signed)
Okay I sent the prescription in to the [facility] long pharmacy.  For mirtazapine 15 mg tablets.  Tell her sometimes a half a tablet works better for better for sleep than a whole tablet but she can try both and see which what works better.

## 2018-11-17 NOTE — Telephone Encounter (Signed)
Spoke with pt. And she states that she is not having any nightmares. She just has trouble falling asleep. She said her mind just keeps going. Once she is asleep she stays asleep. She states that the Remeron has helped in the past and is willing to try that one again until she can get an appt.

## 2018-11-18 NOTE — Telephone Encounter (Signed)
Left a VM. Will try calling again.

## 2018-11-19 NOTE — Telephone Encounter (Signed)
Spoke with pt. And she understands. She is going to schedule an appt. As well.

## 2018-12-01 DIAGNOSIS — Z13820 Encounter for screening for osteoporosis: Secondary | ICD-10-CM | POA: Diagnosis not present

## 2018-12-10 ENCOUNTER — Telehealth: Payer: Self-pay

## 2018-12-10 MED ORDER — SUCRALFATE 1 GM/10ML PO SUSP: ORAL | 2 refills | Status: DC

## 2018-12-10 MED FILL — SUCRALFATE 1 GM/10ML SUSP: 1 | 13 days supply | Qty: 420 | Fill #0

## 2018-12-10 NOTE — Telephone Encounter (Signed)
Yes with 2 refills

## 2018-12-10 NOTE — Telephone Encounter (Signed)
carafate sent in as requested.

## 2018-12-10 NOTE — Telephone Encounter (Signed)
  [name] is requesting a refill on her carafate because she is having stomach pain. May I refill Sir?

## 2018-12-13 ENCOUNTER — Telehealth: Payer: Self-pay | Admitting: Psychiatry

## 2018-12-13 NOTE — Telephone Encounter (Signed)
Pt left voicemail stating she is having side effects from the Remron. Sleepiness, body aches and pain is what she described. Please advise.

## 2018-12-15 NOTE — Telephone Encounter (Signed)
Left pt. A VM to return my call.

## 2018-12-15 NOTE — Telephone Encounter (Signed)
First try reducing it to 1/4 to 1/2 tablet of mirtazapine and if that doesn't work.  Let us know.

## 2018-12-15 NOTE — Telephone Encounter (Signed)
Pt. Returned call. She verbalized understanding and will call back if this does not help.

## 2018-12-15 NOTE — Telephone Encounter (Signed)
Having muscle aches, feeling run down and body aches. She says it works but they next day it makes her feel like she has the flu. She can no longer take it. Please advise.

## 2018-12-22 MED FILL — ALPRAZolam 0.5 MG TABS: 0.5 | 30 days supply | Qty: 30 | Fill #2

## 2018-12-23 DIAGNOSIS — F4323 Adjustment disorder with mixed anxiety and depressed mood: Secondary | ICD-10-CM | POA: Diagnosis not present

## 2018-12-29 ENCOUNTER — Ambulatory Visit: Payer: 59 | Admitting: Family Medicine

## 2018-12-29 ENCOUNTER — Encounter: Payer: Self-pay | Admitting: Family Medicine

## 2018-12-29 ENCOUNTER — Other Ambulatory Visit: Payer: Self-pay

## 2018-12-29 VITALS — BP 120/70 | HR 83 | Temp 98.0°F | Resp 16 | Ht 62.0 in | Wt 125.4 lb

## 2018-12-29 DIAGNOSIS — F4310 Post-traumatic stress disorder, unspecified: Secondary | ICD-10-CM | POA: Diagnosis not present

## 2018-12-29 DIAGNOSIS — G479 Sleep disorder, unspecified: Secondary | ICD-10-CM | POA: Diagnosis not present

## 2018-12-29 DIAGNOSIS — F39 Unspecified mood [affective] disorder: Secondary | ICD-10-CM

## 2018-12-29 DIAGNOSIS — L309 Dermatitis, unspecified: Secondary | ICD-10-CM | POA: Diagnosis not present

## 2018-12-29 DIAGNOSIS — Z79890 Hormone replacement therapy (postmenopausal): Secondary | ICD-10-CM | POA: Diagnosis not present

## 2018-12-29 NOTE — Progress Notes (Signed)
Patient ID: [name], female  DOB: [birth year], 63 y.o.   MRN: [id number] Patient Care Team    Relationship Specialty Notifications Start End  Ma Hillock, DO PCP - General Family Medicine  12/29/18   Gavin Pound, MD Consulting Physician Rheumatology  04/09/17   Maisie Fus, MD Consulting Physician Obstetrics and Gynecology  04/09/17   Devra Dopp, MD Referring Physician Dermatology  04/09/17   Group, [place]  12/29/18    Comment: [facility]    12/29/18   [name], MD Consulting Physician Gastroenterology  12/29/18   Earnie Larsson, MD Consulting Physician Neurosurgery  12/29/18     Chief Complaint  Patient presents with  . Transitions Of Care    Pt was seeing Dr Maudie Mercury and she left the office. Pt has depression and would like to discuss this. She had psychologist but they passed away. She does have appt next week with a new one but states "she needs to someone".     Subjective:  [name] is a 63 y.o.  female present for [facility]- her provider is no longer practicing. All past medical history, surgical history, allergies, family history, immunizations, medications and social history were updated in the electronic medical record today. All recent labs, ED visits and hospitalizations within the last year were reviewed.  Depression, unspecified depression type/Anxiety and depression/ PTSD (post-traumatic stress disorder)/sleep disturbance Patient reports she has been treated for her depression, anxiety, PTSD and panic attacks for at least 4 years.  She currently is taking Lamictal 200 mg, Xanax 0.5 mg nightly and Remeron 7.5 mg nightly.  She reports she had been on Prozac 40 mg but she has not taken that in about a year.  She reports the Prozac to stopped working for her so she stopped taking it.  According to patient she has been tried on Zoloft, Wellbutrin, amitriptyline and Ambien in the past.   All with side effects.  She feels like she is starting to not want to be around people any longer.  Feeling overwhelmed.  She states she is getting panicked and then that makes her angry that she is not able to control the panic.  When she has her panic she feels like her heart rate races she feels a sense of doom and out of control.  She also feels like something bad is going to happen.  She had been established at behavioral health with Earley Abide, PA-C.  That provider has passed away and she needed to establish with a new provider.  She has an appointment with a new psychiatrist Dr. Arnoldo Morale next week.  She also would like to be referred to a psychologist or therapist.  She has a therapist but has not been able to go because the co-pay is $70 because that particular person is out of her network.  Hormone replacement therapy Patient is on hormone replacement therapy through her gynecologist.  Eczema:  She is prescribed steroid cream for her eczema through her dermatologist.  She has not been able to see them in quite some time but does not need refills at this time.   Depression screen [facility] 2/9 12/29/2018 04/20/2018 04/09/2017  Decreased Interest 2 0 0  Down, Depressed, Hopeless 3 0 0  PHQ - 2 Score 5 0 0  Altered sleeping 2 2 2   Tired, decreased energy 1 2 0  Change in appetite 3 0 0  Feeling  bad or failure about yourself  1 0 0  Trouble concentrating 2 0 0  Moving slowly or fidgety/restless 2 0 0  Suicidal thoughts 1 0 0  PHQ-9 Score 17 4 2   Difficult doing work/chores Extremely dIfficult - -  Some recent data might be hidden   GAD 7 : Generalized Anxiety Score 12/29/2018  Nervous, Anxious, on Edge 3  Control/stop worrying 3  Worry too much - different things 3  Trouble relaxing 2  Restless 2  Easily annoyed or irritable 3  Afraid - awful might happen 2  Total GAD 7 Score 18  Anxiety Difficulty Extremely difficult     Fall Risk  12/22/2016  Falls in the past year? No    Immunization History  Administered Date(s) Administered  . Influenza,inj,Quad PF,6+ Mos 04/12/2014  . Influenza-Unspecified 05/15/2015, 03/28/2018  . Td 07/15/2003    No exam data present  Past Medical History:  Diagnosis Date  . Allergy   . Anemia    after birth of child 20yrs ago  . Anxiety and depression    with insomnia  . Blood transfusion without reported diagnosis    x3  . Chronic back pain    ? RA, ? Lupus - reports saw rheumatologist in the past but better now, sees Dr. Trenton Gammon  . Eczema   . GERD (gastroesophageal reflux disease)    takes Omeprazole daily, with nausea  . Heart murmur   . Hemorrhoids   . History of kidney stones    passed on her own  . History of migraine    last one a yr ago and takes Zomig prn  . Hot flashes 11/10/2014  . HTN (hypertension) 03/31/2012  . Hx of echocardiogram    Echo (8/15):  EF 55-60%, no RWMA  . IBS (irritable bowel syndrome)    constipation predominant  . Interstitial cystitis    hx of UTI  . Numbness    both legs and related to back  . Osteoporosis    osteopenia  . Palpitations    on metoprolol in past but made BP too low  . Pneumonia    hx of;last time 54yrs ago  . PONV (postoperative nausea and vomiting)    laryngospasm-1999  . Poor vision 10/15/2015   -? Ocular rosacea -sees opthomology   . Raynaud's disease /phenomenon 10/21/2012   no meds, worse in the winter or cold environment.   . Rheumatoid aortitis    uses Diclofenac gel;Rheumatoid  . Seasonal allergies    Flonase daily   . Urinary incontinence    Allergies  Allergen Reactions  . Aspartame And Phenylalanine Other (See Comments)    Migraines  . Beef-Derived Products Other (See Comments)    severe cramping  . Citrus Other (See Comments)    Causes Interstitial cystitis flares  . Penicillins Hives    Denies airway involvement Has patient had a PCN reaction causing immediate rash, facial/tongue/throat swelling, SOB or lightheadedness with hypotension:  yes hives.  Has patient had a PCN reaction causing severe rash involving mucus membranes or skin necrosis:no Has patient had a PCN reaction that required hospitalization No Has patient had a PCN reaction occurring within the last 10 years: No If all of the above answers are "NO", then may proceed with Cephalosporin use.   . Promethazine Hcl Other (See Comments)    Muscle twitching and contracture   . Adhesive [Tape]     Red splotches  . Doxycycline Nausea And Vomiting  . Morphine Sulfate Nausea  And Vomiting   Past Surgical History:  Procedure Laterality Date  . ABDOMINAL HYSTERECTOMY  1997  . BREAST BIOPSY  1999   benign  . COLONOSCOPY  02/11/2013 and 12/24/04   internal and external hemorrhoids  . ENDOMETRIAL ABLATION    . EXCISION MORTON'S NEUROMA Right   . FLEXIBLE SIGMOIDOSCOPY  03/07/2008   internal and external hemorrhoids  . POSTERIOR LUMBAR FUSION  05/02/2013   L4-L5 fusion (cage/screws/bone graft) Dr. Annette Stable  . TONSILLECTOMY    . UPPER GASTROINTESTINAL ENDOSCOPY  10/23/2010   gastritis, irregular Z-line  . wisdom teeth extracted     Family History  Problem Relation Age of Onset  . Coronary artery disease Father   . Heart attack Father   . Hypertension Father   . Depression Father   . Hyperlipidemia Father   . Alcohol abuse Father   . Early death Father   . Kidney disease Father   . Mental illness Father   . Diverticulosis Mother   . Hypertension Mother   . Osteoarthritis Mother   . Depression Mother   . Hyperlipidemia Mother   . Kidney disease Mother   . Alcohol abuse Sister   . Depression Sister   . Diabetes Paternal Uncle   . Hypertension Maternal Grandmother   . Stroke Maternal Grandmother   . Hypertension Maternal Grandfather   . Pancreatic cancer Maternal Grandfather   . Early death Maternal Grandfather   . Stroke Paternal Grandfather   . Hyperlipidemia Paternal Grandfather   . Alcohol abuse Paternal Grandfather   . Early death Paternal Grandfather    . Hypertension Paternal Grandfather   . Depression Paternal Grandmother   . Dementia Paternal Grandmother   . Arthritis Paternal Grandmother   . Mental illness Paternal Grandmother   . Alcohol abuse Daughter   . Depression Daughter   . Drug abuse Daughter   . Alcohol abuse Son   . Depression Son   . Colon cancer Neg Hx    Social History   Social History Narrative   Spiritual Beliefs: methodist   Marital status/children/pets: In second marriage.  First husband committed suicide.  2 children.   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  Has patient had a PCN reaction causing severe rash involving mucus membranes or skin necrosis:no Has patient had a PCN reaction that required hospitalization No Has patient had a PCN reaction occurring within the last 10 years: No If all of the above answers are "NO", then may proceed with Cephalosporin use.   Promethazine Hcl Other (See Comments)   Muscle twitching and contracture    Adhesive [tape]    Red splotches   Doxycycline Nausea And Vomiting   Morphine Sulfate Nausea And Vomiting      Medication List  Accurate as of December 29, 2018  4:47 PM. If you have any questions, ask your nurse or doctor.        STOP taking these medications   cefUROXime 500 MG tablet Commonly known as: Ceftin Stopped by: Howard Pouch, DO    etanercept 50 MG/ML injection Commonly known as: Enbrel Stopped by: Howard Pouch, DO   FLUoxetine 40 MG capsule Commonly known as: PROZAC Stopped by: Howard Pouch, DO     TAKE these medications   ALPRAZolam 0.5 MG tablet Commonly known as: Xanax Take 1 tablet (0.5 mg total) by mouth at bedtime as needed for anxiety.   cholecalciferol 1000 units tablet Commonly known as: VITAMIN D Take 1,000 Units by mouth every evening.   clobetasol 0.05 % external solution Commonly known as: TEMOVATE APPLY 1 APPLICATION TOPICALLY DAILY AS NEEDED FOR DERMATITIS   estradiol 0.075 MG/24HR Commonly known as: VIVELLE-DOT Place 1 patch onto the skin 2 (two) times a week. Wednesday and Sunday   fluticasone 50 MCG/ACT nasal spray Commonly known as: FLONASE Place 2 sprays into the nose daily.   lamoTRIgine 200 MG tablet Commonly known as: LAMICTAL TAKE 1 TABLET BY MOUTH ONCE DAILY   mirtazapine 15 MG tablet Commonly known as: REMERON Take 1 tablet (15 mg total) by mouth at bedtime.   multivitamin with minerals Tabs tablet Take 1 tablet by mouth every evening.   Pazeo 0.7 % Soln Generic drug: Olopatadine HCl   sucralfate 1 GM/10ML suspension Commonly known as: Carafate TAKE 10 MLS BY MOUTH 3 TIMES DAILY AS NEEDED FOR DIGESTION       All past medical history, surgical history, allergies, family history, immunizations andmedications were updated in the EMR today and reviewed under the history and medication portions of their EMR.    No results found for this or any previous visit (from the past 2160 hour(s)).  Mm 3d Screen Breast Bilateral  Result Date: 06/15/2018 CLINICAL DATA:  Screening. EXAM: DIGITAL SCREENING BILATERAL MAMMOGRAM WITH TOMO AND CAD COMPARISON:  Previous exam(s). ACR Breast Density Category c: The breast tissue is heterogeneously dense, which may obscure small masses. FINDINGS: There are no findings suspicious for malignancy. Images were processed with CAD. IMPRESSION:  No mammographic evidence of malignancy. A result letter of this screening mammogram will be mailed directly to the patient. RECOMMENDATION: Screening mammogram in one year. (Code:SM-B-01Y) BI-RADS CATEGORY  1: Negative. Electronically Signed   By: Lajean Manes M.D.   On: 06/15/2018 15:17     ROS: 14 pt review of systems performed and negative (unless mentioned in an HPI)  Objective: BP 120/70 (BP Location: Left Arm, Patient Position: Sitting, Cuff Size: Normal)   Pulse 83   Temp 98 F (36.7 C) (Temporal)   Resp 16   Ht 5\' 2"  (1.575 m)   Wt 125 lb 6 oz (56.9 kg)   LMP  (Exact Date)   SpO2 99%   BMI 22.93 kg/m  Gen: Afebrile. No acute distress. Nontoxic in appearance, well-developed, well-nourished,  Pleasant caucasian female HENT: AT. [place]. MMM Eyes:Pupils Equal Round Reactive to light, Extraocular movements intact,  Conjunctiva without redness, discharge or icterus. Neck/lymp/endocrine: Supple,no lymphadenopathy, no thyromegaly CV: RRR no murmur, no edema, +2/4 P posterior tibialis pulses.  Chest: CTAB, no wheeze, rhonchi or crackles.  Skin: Warm and well-perfused. Skin intact. Neuro/Msk: Normal gait. PERLA. EOMi. Alert. Oriented x3.   Psych: Normal affect, dress and demeanor. Normal speech. Normal thought content and judgment.   Assessment/plan: [name] is a 64  y.o. female present for  Anxiety and depression/PTSD (post-traumatic stress disorder)/Sleep disturbance -Continue Lamictal 200 and Remeron 7.5 mg nightly.  She has follow-up scheduled with new psychiatrist next week she will be managing her medications.  She does not need refills today. -Referral to psychology placed, Dr. Gaynell Face if able.  If not able to schedule with Dr. Gaynell Face try to keep within the [facility] system.  However patient does not want to be referred to a therapist within the [place] building secondary to that is her place of employment. - Ambulatory referral to Psychology  Hormone replacement therapy  Patient is on hormone replacement therapy through her gynecologist.  Eczema, unspecified type Uses clobetasol cream as needed.  Is established with dermatology but has not been able to go.  Does not need refills today.    Return in about 4 months (around 04/30/2019) for CPE (30 min).   Note is dictated utilizing voice recognition software. Although note has been proof read prior to signing, occasional typographical errors still can be missed. If any questions arise, please do not hesitate to call for verification.  Electronically signed by: Howard Pouch, DO [place]

## 2018-12-29 NOTE — Patient Instructions (Signed)
It was a pleasure meeting you today.   I will place the referral for you and they will give you a call to schedule.   We will call you to get your physical schedule.    Please help [place] help you:  We are honored you have chosen [facility] for your Primary Care home. Below you will find basic instructions that you may need to access in the future. Please help [place] help you by reading the instructions, which cover many of the frequent questions we experience.   Prescription refills and request:  -In order to allow more efficient response time, please call your pharmacy for all refills. They will forward the request electronically to [place]. This allows for the quickest possible response. Request left on a nurse line can take longer to refill, since these are checked as time allows between office patients and other phone calls.  - refill request can take up to 3-5 working days to complete.  - If request is sent electronically and request is appropiate, it is usually completed in 1-2 business days.  - all patients will need to be seen routinely for all chronic medical conditions requiring prescription medications (see follow-up below). If you are overdue for follow up on your condition, you will be asked to make an appointment and we will call in enough medication to cover you until your appointment (up to 30 days).  - all controlled substances will require a face to face visit to request/refill.  - if you desire your prescriptions to go through a new pharmacy, and have an active script at original pharmacy, you will need to call your pharmacy and have scripts transferred to new pharmacy. This is completed between the pharmacy locations and not by your provider.    Results: If any images or labs were ordered, it can take up to 1 week to get results depending on the test ordered and the lab/facility running and resulting the test. - Normal or stable results, which do not need further discussion, may be  released to your mychart immediately with attached note to you. A call may not be generated for normal results. Please make certain to sign up for mychart. If you have questions on how to activate your mychart you can call the front office.  - If your results need further discussion, our office will attempt to contact you via phone, and if unable to reach you after 2 attempts, we will release your abnormal result to your mychart with instructions.  - All results will be automatically released in mychart after 1 week.  - Your provider will provide you with explanation and instruction on all relevant material in your results. Please keep in mind, results and labs may appear confusing or abnormal to the untrained eye, but it does not mean they are actually abnormal for you personally. If you have any questions about your results that are not covered, or you desire more detailed explanation than what was provided, you should make an appointment with your provider to do so.   Our office handles many outgoing and incoming calls daily. If we have not contacted you within 1 week about your results, please check your mychart to see if there is a message first and if not, then contact our office.  In helping with this matter, you help decrease call volume, and therefore allow [place] to be able to respond to patients needs more efficiently.   Acute office visits (sick visit):  An acute  visit is intended for a new problem and are scheduled in shorter time slots to allow schedule openings for patients with new problems. This is the appropriate visit to discuss a new problem. Problems will not be addressed by phone call or Echart message. Appointment is needed if requesting treatment. In order to provide you with excellent quality medical care with proper time for you to explain your problem, have an exam and receive treatment with instructions, these appointments should be limited to one new problem per visit. If you  experience a new problem, in which you desire to be addressed, please make an acute office visit, we save openings on the schedule to accommodate you. Please do not save your new problem for any other type of visit, let us take care of it properly and quickly for you.   Follow up visits:  Depending on your condition(s) your provider will need to see you routinely in order to provide you with quality care and prescribe medication(s). Most chronic conditions (Example: hypertension, Diabetes, depression/anxiety... etc), require visits a couple times a year. Your provider will instruct you on proper follow up for your personal medical conditions and history. Please make certain to make follow up appointments for your condition as instructed. Failing to do so could result in lapse in your medication treatment/refills. If you request a refill, and are overdue to be seen on a condition, we will always provide you with a 30 day script (once) to allow you time to schedule.    Medicare wellness (well visit): - we have a wonderful Nurse Maudie Mercury), that will meet with you and provide you will yearly medicare wellness visits. These visits should occur yearly (can not be scheduled less than 1 calendar year apart) and cover preventive health, immunizations, advance directives and screenings you are entitled to yearly through your medicare benefits. Do not miss out on your entitled benefits, this is when medicare will pay for these benefits to be ordered for you.  These are strongly encouraged by your provider and is the appropriate type of visit to make certain you are up to date with all preventive health benefits. If you have not had your medicare wellness exam in the last 12 months, please make certain to schedule one by calling the office and schedule your medicare wellness with Maudie Mercury as soon as possible.   Yearly physical (well visit):  - Adults are recommended to be seen yearly for physicals. Check with your insurance  and date of your last physical, most insurances require one calendar year between physicals. Physicals include all preventive health topics, screenings, medical exam and labs that are appropriate for gender/age and history. You may have fasting labs needed at this visit. This is a well visit (not a sick visit), new problems should not be covered during this visit (see acute visit).  - Pediatric patients are seen more frequently when they are younger. Your provider will advise you on well child visit timing that is appropriate for your their age. - This is not a medicare wellness visit. Medicare wellness exams do not have an exam portion to the visit. Some medicare companies allow for a physical, some do not allow a yearly physical. If your medicare allows a yearly physical you can schedule the medicare wellness with our nurse Maudie Mercury and have your physical with your provider after, on the same day. Please check with insurance for your full benefits.   Late Policy/No Shows:  - all new patients should arrive 15-30 minutes  earlier than appointment to allow [place] time  to  obtain all personal demographics,  insurance information and for you to complete office paperwork. - All established patients should arrive 10-15 minutes earlier than appointment time to update all information and be checked in .  - In our best efforts to run on time, if you are late for your appointment you will be asked to either reschedule or if able, we will work you back into the schedule. There will be a wait time to work you back in the schedule,  depending on availability.  - If you are unable to make it to your appointment as scheduled, please call 24 hours ahead of time to allow [place] to fill the time slot with someone else who needs to be seen. If you do not cancel your appointment ahead of time, you may be charged a no show fee.

## 2019-01-05 ENCOUNTER — Ambulatory Visit (INDEPENDENT_AMBULATORY_CARE_PROVIDER_SITE_OTHER): Payer: 59 | Admitting: Psychiatry

## 2019-01-05 ENCOUNTER — Other Ambulatory Visit: Payer: Self-pay

## 2019-01-05 ENCOUNTER — Encounter: Payer: Self-pay | Admitting: Psychiatry

## 2019-01-05 VITALS — Ht 62.0 in | Wt 127.0 lb

## 2019-01-05 DIAGNOSIS — F4310 Post-traumatic stress disorder, unspecified: Secondary | ICD-10-CM | POA: Diagnosis not present

## 2019-01-05 DIAGNOSIS — F331 Major depressive disorder, recurrent, moderate: Secondary | ICD-10-CM | POA: Diagnosis not present

## 2019-01-05 DIAGNOSIS — F410 Panic disorder [episodic paroxysmal anxiety] without agoraphobia: Secondary | ICD-10-CM | POA: Diagnosis not present

## 2019-01-05 MED ORDER — ALPRAZOLAM 0.5 MG PO TABS: 0.5000 mg | ORAL_TABLET | Freq: Three times a day (TID) | ORAL | 1 refills | Status: DC | PRN

## 2019-01-05 MED ORDER — CLOMIPRAMINE HCL 25 MG PO CAPS: 25.0000 mg | ORAL_CAPSULE | Freq: Every day | ORAL | 1 refills | Status: DC

## 2019-01-05 MED ORDER — FLUOXETINE HCL 40 MG PO CAPS: 40.0000 mg | ORAL_CAPSULE | Freq: Every day | ORAL | 0 refills | Status: DC

## 2019-01-05 MED ORDER — LAMOTRIGINE 200 MG PO TABS: 200.0000 mg | ORAL_TABLET | Freq: Every day | ORAL | 0 refills | Status: DC

## 2019-01-05 MED FILL — ALPRAZolam 0.5 MG TABS: 0.5 | 30 days supply | Qty: 90 | Fill #0

## 2019-01-05 MED FILL — FLUoxetine HCL 40 MG CAPS: 40 | 90 days supply | Qty: 90 | Fill #0

## 2019-01-05 MED FILL — clomiPRAMINE HCL 25 MG CAPS: 25 | 30 days supply | Qty: 30 | Fill #0

## 2019-01-05 MED FILL — SUBVENITE 200 MG TABS: 200 | 90 days supply | Qty: 90 | Fill #0
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
## 2019-01-17 DIAGNOSIS — Z79899 Other long term (current) drug therapy: Secondary | ICD-10-CM | POA: Diagnosis not present

## 2019-01-17 DIAGNOSIS — M7062 Trochanteric bursitis, left hip: Secondary | ICD-10-CM | POA: Diagnosis not present

## 2019-01-17 DIAGNOSIS — M2550 Pain in unspecified joint: Secondary | ICD-10-CM | POA: Diagnosis not present

## 2019-01-17 DIAGNOSIS — Z1589 Genetic susceptibility to other disease: Secondary | ICD-10-CM | POA: Diagnosis not present

## 2019-01-17 DIAGNOSIS — M150 Primary generalized (osteo)arthritis: Secondary | ICD-10-CM | POA: Diagnosis not present

## 2019-01-17 DIAGNOSIS — M25552 Pain in left hip: Secondary | ICD-10-CM | POA: Diagnosis not present

## 2019-01-17 DIAGNOSIS — R102 Pelvic and perineal pain: Secondary | ICD-10-CM | POA: Diagnosis not present

## 2019-01-17 DIAGNOSIS — M5416 Radiculopathy, lumbar region: Secondary | ICD-10-CM | POA: Diagnosis not present

## 2019-01-17 DIAGNOSIS — M0600 Rheumatoid arthritis without rheumatoid factor, unspecified site: Secondary | ICD-10-CM | POA: Diagnosis not present

## 2019-01-26 ENCOUNTER — Encounter: Payer: Self-pay | Admitting: Family Medicine

## 2019-02-01 ENCOUNTER — Ambulatory Visit (INDEPENDENT_AMBULATORY_CARE_PROVIDER_SITE_OTHER): Payer: 59 | Admitting: Psychiatry

## 2019-02-01 ENCOUNTER — Other Ambulatory Visit: Payer: Self-pay

## 2019-02-01 ENCOUNTER — Encounter: Payer: Self-pay | Admitting: Psychiatry

## 2019-02-01 VITALS — Ht 62.0 in | Wt 122.0 lb

## 2019-02-01 DIAGNOSIS — F330 Major depressive disorder, recurrent, mild: Secondary | ICD-10-CM | POA: Diagnosis not present

## 2019-02-01 DIAGNOSIS — F410 Panic disorder [episodic paroxysmal anxiety] without agoraphobia: Secondary | ICD-10-CM | POA: Diagnosis not present

## 2019-02-01 DIAGNOSIS — F4310 Post-traumatic stress disorder, unspecified: Secondary | ICD-10-CM | POA: Diagnosis not present

## 2019-02-01 MED ORDER — DESVENLAFAXINE SUCCINATE ER 25 MG PO TB24: 25.0000 mg | ORAL_TABLET | Freq: Every day | ORAL | 1 refills | Status: DC

## 2019-02-01 MED FILL — DESVENLAFAXINE SUCCINATE ER: 25 | 30 days supply | Qty: 30 | Fill #0
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
## 2019-02-02 ENCOUNTER — Ambulatory Visit: Payer: 59 | Admitting: Psychiatry

## 2019-02-09 ENCOUNTER — Other Ambulatory Visit: Payer: Self-pay

## 2019-02-09 ENCOUNTER — Ambulatory Visit (INDEPENDENT_AMBULATORY_CARE_PROVIDER_SITE_OTHER): Payer: 59 | Admitting: Psychiatry

## 2019-02-09 DIAGNOSIS — F330 Major depressive disorder, recurrent, mild: Secondary | ICD-10-CM | POA: Diagnosis not present
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
## 2019-02-14 MED FILL — ESTRADIOL 0.1 MG/24HR PTTW: 0.1 | 84 days supply | Qty: 24 | Fill #1

## 2019-02-22 ENCOUNTER — Ambulatory Visit (INDEPENDENT_AMBULATORY_CARE_PROVIDER_SITE_OTHER): Payer: 59 | Admitting: Psychiatry

## 2019-02-22 ENCOUNTER — Other Ambulatory Visit: Payer: Self-pay

## 2019-02-22 DIAGNOSIS — F330 Major depressive disorder, recurrent, mild: Secondary | ICD-10-CM | POA: Diagnosis not present

## 2019-02-22 NOTE — Progress Notes (Signed)
[facility] Counsel[facility] Initial Adult Exam  Name: [name] Date: 02/22/2019 MRN: [id number] DOB: [birth year] PCP: Carol Pouch A, DO  Time spent: 60 minutes    12:00noon to 1:00pm   Reason for Visit /Presenting Problem:   Anxiety, depression, "mind keeps running and hard to sleep sometimes; occasional panic feelings, some sleep issues (better)    Mental Status Exam:   Appearance:   Casual     Behavior:  Appropriate and Sharing  Motor:  Normal  Speech/Language:   Clear and Coherent  Affect:  Depressed and anxious  Mood:  anxious and depressed  Thought process:  normal  Thought content:    WNL  Sensory/Perceptual disturbances:    WNL  Orientation:  oriented to time/date, situation, day of week, month of year and year  Attention:  Good  Concentration:  Good  Memory:  WNL  Fund of knowledge:   Good  Insight:    Good  Judgment:   Good  Impulse Control:  Good    Risk Assessment: Danger to Self:  No Self-injurious Behavior: No Danger to Others: No Duty to Warn:no Physical Aggression / Violence:No  Access to Firearms [name] concern: No  Gang Involvement:No  Patient / guardian was educated about steps to take if suicide or homicide risk level increases between visits: Denies any SI.   Subject: Patient in today reporting symptoms noted above.  Reports that "she needs and wants to work on [employer], and is flooded with thoughts and feelings about what will be helpful."  Feels she didn't learn to trust but rather learned to not trust and how to use defense mechanisms. Dad was mean alcoholic and very unpredictable. Other behaviors within her home as she grew up led to lots of distrust. Over the years and 3 marriages, I've learned [name] lot and have become more self-reliant in some ways but still struggling with trust, fear of failure, and fear of being judged. Mom--always about her and looks, very judgemental. Most fears rooted in childhood and in unhealthy marriages. Very sensitive to any  criticism.  Minimal contact with her sister and then it is via texting.  Wants to connect with sister in person but sister not ready yet. Realizing the origins of some of her issues and we looked more closely at trust issues today.  Introduced some strategies that patient can try that can help her develop more trust and also can help her fear of failure and being judged.  Patient seemed trusting in session as she shared lots of information regarding more history and current dynamics playing out in family.  Homework given re: trust .  Will return within 2 wks.   *(info from initial visit - to save with note for now)* Relationship Status: married for 14 yrs and this is 3rd marriage.  1st husband (married 13 yrs and then divorced.  2nd husband committed suicide after being married 6 yrs.  Is [name] parent with 2 adult children: 62 yr old female (lives in [place]  and 62 yr old female lives in [place].) Strengths:  Supportive Relationships, Conservator, museum/gallery, Able to Communicate Effectively and perseverance, resilient Barriers:  "my history and all I've been through", sometimes I stand back and just want to not work with anyone."  Added Info:   Patient is 63 yr old caucasian female currently in 3rd marriage.  Dad was alcoholic, "mean alcoholic", committed suicide after having [name] heart attack.  Mom, 51, is still living and resides in [place]. First marriage ended in divorce,  2nd marriage (husband committed suicide due to "mental health problems", and is currently in her 45th year of marriage to her 39rd husband.  Patient reports current marriage is [name] happy and healthy one.  Has 2 adult kids (ages 42-son in [place]. And 37-daughter in [place]).  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   Diagnoses:  Mild Recurrent Major Depression   HCL  F33.0   Plan of Care:  Wants to get better and be less depressed and less anxious, and sleep better.  Depression does seem to be more  prominent than anxiety at this time.  Concerned about trust issues, fear of being judged, and "getting her anxiety and depression under control." Denies any SI or HI.  Return within 1-2 weeks for next session.   Carol Ace, LCSW

## 2019-03-02 ENCOUNTER — Other Ambulatory Visit: Payer: Self-pay

## 2019-03-02 ENCOUNTER — Ambulatory Visit (INDEPENDENT_AMBULATORY_CARE_PROVIDER_SITE_OTHER): Payer: 59 | Admitting: Psychiatry

## 2019-03-02 ENCOUNTER — Encounter: Payer: Self-pay | Admitting: Psychiatry

## 2019-03-02 VITALS — Ht 62.0 in | Wt 120.0 lb

## 2019-03-02 DIAGNOSIS — F4310 Post-traumatic stress disorder, unspecified: Secondary | ICD-10-CM | POA: Diagnosis not present

## 2019-03-02 DIAGNOSIS — F410 Panic disorder [episodic paroxysmal anxiety] without agoraphobia: Secondary | ICD-10-CM

## 2019-03-02 DIAGNOSIS — F330 Major depressive disorder, recurrent, mild: Secondary | ICD-10-CM | POA: Diagnosis not present

## 2019-03-02 MED ORDER — DESVENLAFAXINE SUCCINATE ER 25 MG PO TB24: 25.0000 mg | ORAL_TABLET | Freq: Every day | ORAL | 1 refills | Status: DC

## 2019-03-02 MED ORDER — LAMOTRIGINE 200 MG PO TABS: 200.0000 mg | ORAL_TABLET | Freq: Every day | ORAL | 0 refills | Status: DC

## 2019-03-02 MED ORDER — ALPRAZOLAM 0.5 MG PO TABS: 0.5000 mg | ORAL_TABLET | Freq: Three times a day (TID) | ORAL | 1 refills | Status: DC | PRN

## 2019-03-02 MED ORDER — FLUOXETINE HCL 40 MG PO CAPS: 40.0000 mg | ORAL_CAPSULE | Freq: Every day | ORAL | 0 refills | Status: DC

## 2019-03-02 MED FILL — ALPRAZolam 0.5 MG TABS: 0.5 | 30 days supply | Qty: 90 | Fill #0

## 2019-03-02 MED FILL — DESVENLAFAXINE SUCCINATE ER: 25 | 90 days supply | Qty: 90 | Fill #0

## 2019-03-02 NOTE — Progress Notes (Signed)
[facility] Med Check  Patient ID: MAPLE ODANIEL,  MRN: [id number]  PCP: Ma Hillock, DO  Date of Evaluation: 03/02/2019 Time spent:20 minutes from 1425 to [place]  Chief Complaint:  Chief Complaint    Anxiety; Panic Attack; Depression      HISTORY/CURRENT STATUS: [name] is seen onsite in office face-to-face individually with consent with epic collateral for psychiatric interview and exam in 4-week evaluation and management of panic and posttraumatic stress anxiety, major depression, and major psychosocial stressors learning yesterday that husband's prostate cancer has a recurrence that is microscopic likely in the abdomen needing radiation according also to his rising PSA.  She states at the same time that she must help her husband, but she must also ask for help for her self, though concluding by the end of the session that she is actually doing reasonably well herself and can help husband who is angry about the recurrence and not willing to ride bikes with her.  She rode 25 miles today on country roads carefully.  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  She has lost 2 pounds but is otherwise holding up reasonably well.  She has changed therapists now seeing Carol Cloud, LCSW and is very pleased with her current therapy here.  She tolerates full symptom review concluding the Pristiq helps when Anafranil did not for obsessional fixations in her anxiety and depression, also taken at night.  She needs Pristiq and Xanax refills as she takes Xanax at bedtime to help her sleep and relieve anxiety but has it available in the day if needed, thought to have 1 refill from 01/05/2019 but [name] recorded 0 refills in [facility] registry.  Otherwise her Lamictal and Prozac supplies are secure but going to run out end of September as patient attempts to remain prepared in caring for her husband.   She has no mania, suicidality, psychosis, or delirium.  Depression        The patient presents withdepression recurrence starting more than 1 month ago. The onset quality is sudden. The problem occurs intermittently.The problem has been rapidly improvingsince onset.Associated symptoms include guilty doubt for self and others, decreased concentration,fatigue,helplessness,insomnia,and sad. Associated symptoms include not irritable, no suicidality, no hopelessness, no restlessness,no decreased interestand no headaches.The symptoms are aggravated by work stress, social issues, family issues and medication.Past treatments include SSRIs - Selective serotonin reuptake inhibitors, TCAs - Tricyclic antidepressants, other medications and psychotherapy.Compliance with treatment is variable.Past compliance problems include medical issues, difficulty with treatment plan and medication issues.Previous treatment provided moderaterelief.Risk factors include a change in medication usage/dosage, emotional abuse, family history of mental illness, family history, family violence, history of mental illness, major life event, prior traumatic experience and stress. Past medical history includes chronic illness,physical disability,anxiety,depressionand mental health disorder. Pertinent negatives include no life-threatening condition,no recent psychiatric admission,no bipolar disorder,no eating disorder,no obsessive-compulsive disorder,no post-traumatic stress disorder,no schizophrenia,no suicide attemptsand no head trauma.  Individual Medical History/ Review of Systems: Changes? :Yes Annual general medical exam is scheduled for April 27, 2019.  Allergies: Aspartame and phenylalanine, Beef-derived products, Citrus, Penicillins, Promethazine hcl, Adhesive [tape], Doxycycline, and Morphine sulfate  Current Medications:  Current Outpatient Medications:  .  ALPRAZolam (XANAX) 0.5  MG tablet, Take 1 tablet (0.5 mg total) by mouth 3 (three) times daily as needed for anxiety or sleep (panic)., Disp: 90 tablet, Rfl: 1 .  cholecalciferol (VITAMIN D) 1000  UNITS tablet, Take 1,000 Units by mouth every evening., Disp: , Rfl:  .  clobetasol (TEMOVATE) 0.05 % external solution, APPLY 1 APPLICATION TOPICALLY DAILY AS NEEDED FOR DERMATITIS, Disp: 50 mL, Rfl: 1 .  Desvenlafaxine Succinate ER (PRISTIQ) 25 MG TB24, Take 25 mg by mouth at bedtime., Disp: 90 tablet, Rfl: 1 .  estradiol (VIVELLE-DOT) 0.075 MG/24HR, Place 1 patch onto the skin 2 (two) times a week. Wednesday and Sunday, Disp: , Rfl:  .  FLUoxetine (PROZAC) 40 MG capsule, Take 1 capsule (40 mg total) by mouth daily after breakfast., Disp: 90 capsule, Rfl: 0 .  fluticasone (FLONASE) 50 MCG/ACT nasal spray, Place 2 sprays into the nose daily., Disp: 16 g, Rfl: 5 .  lamoTRIgine (LAMICTAL) 200 MG tablet, Take 1 tablet (200 mg total) by mouth at bedtime., Disp: 90 tablet, Rfl: 0 .  Multiple Vitamin (MULTIVITAMIN WITH MINERALS) TABS, Take 1 tablet by mouth every evening., Disp: , Rfl:  .  PAZEO 0.7 % SOLN, , Disp: , Rfl: 98 .  sucralfate (CARAFATE) 1 GM/10ML suspension, TAKE 10 MLS BY MOUTH 3 TIMES DAILY AS NEEDED FOR DIGESTION, Disp: 840 mL, Rfl: 2   Medication Side Effects: none  Family Medical/ Social History: Changes? No, [name] previously noting depression in father, mother, and sister while father had substance use and cardiac disease, reporting everyone in family had anxiety.  MENTAL HEALTH EXAM:  Height 5\' 2"  (1.575 m), weight 120 lb (54.4 kg).Body mass index is 21.95 kg/m.  Others deferred for coronavirus pandemic  General Appearance: Casual, Fairly Groomed, Guarded and Meticulous  Eye Contact:  Fair  Speech:  Clear and Coherent, Normal Rate and Talkative  Volume:  Normal  Mood:  Anxious, Depressed, Dysphoric, Euthymic and Worthless  Affect:  Congruent, Depressed, Inappropriate, Full Range and Anxious  Thought  Process:  Coherent, Goal Directed and Irrelevant  Orientation:  Full (Time, Place, and Person)  Thought Content: Obsessions, Rumination and Cluster C avoidance more than dependence   Suicidal Thoughts:  No  Homicidal Thoughts:  No  Memory:  Immediate;   Good Remote;   Good  Judgement:  Fair  Insight:  Fair  Psychomotor Activity:  Increased, Decreased, Mannerisms and Restlessness  Concentration:  Concentration: Fair and Attention Span: Good  Recall:  Good  Fund of Knowledge: Good  Language: Good  Assets:  Desire for Improvement Leisure Time Resilience Talents/Skills Vocational/Educational  ADL's:  Intact  Cognition: WNL  Prognosis:  Good    DIAGNOSES:    ICD-10-CM   1. Mild recurrent major depression (HCC)  F33.0 Desvenlafaxine Succinate ER (PRISTIQ) 25 MG TB24  2. PTSD (post-traumatic stress disorder)  F43.10 Desvenlafaxine Succinate ER (PRISTIQ) 25 MG TB24    ALPRAZolam (XANAX) 0.5 MG tablet    FLUoxetine (PROZAC) 40 MG capsule    lamoTRIgine (LAMICTAL) 200 MG tablet  3. Panic disorder  F41.0 Desvenlafaxine Succinate ER (PRISTIQ) 25 MG TB24    ALPRAZolam (XANAX) 0.5 MG tablet    FLUoxetine (PROZAC) 40 MG capsule  4. Moderate recurrent major depression (HCC)  F33.1 FLUoxetine (PROZAC) 40 MG capsule    lamoTRIgine (LAMICTAL) 200 MG tablet    Receiving Psychotherapy: Yes With Shanon Ace, LCSW   RECOMMENDATIONS: Whereas patient enters the session doubtfully avoiding any treatment resolution, she completes the session concluding that husband needs her availability now more than she needs any change in her treatment, including asserting to extend the interval until return.  Psychosupportive psychoeducation clarifies symptoms and targets treatment to integrate with now underway  cognitive behavioral sleep hygiene, activity and exercise, and social relational skill interventions.  Pristiq is E scribed 25 mg every bedtime changed to a 90-day supply and 1 refill sent to [facility]  for anxiety and depression.  Xanax is E scribed 0.5 mg 3 times daily as needed for anxiety or insomnia sent as #90 with 1 refill to [facility] for PTSD and panic.  Updated supply of Prozac 40 mg every morning #90 and Lamictal 200 mg every bedtime #90 with no refill for either are E scribed to [facility] for end of September next fill needed in interim for anxiety and depression.  She returns for follow-up in 3 months.   Delight Hoh, MD

## 2019-03-10 ENCOUNTER — Ambulatory Visit (INDEPENDENT_AMBULATORY_CARE_PROVIDER_SITE_OTHER): Payer: 59 | Admitting: Psychiatry

## 2019-03-10 ENCOUNTER — Other Ambulatory Visit: Payer: Self-pay

## 2019-03-10 DIAGNOSIS — F330 Major depressive disorder, recurrent, mild: Secondary | ICD-10-CM

## 2019-03-10 NOTE — Progress Notes (Signed)
[facility] Counselor / Therapist Progress Note  Name: [name] Date: 03/10/2019 MRN: [id number] DOB: Nov 25, 1955 PCP: Howard Pouch A, DO  Time spent: 60 minutes    12:00noon to 1:00pm   Reason for Visit /Presenting Problem:   Anxiety, depression, "mind keeps running and hard to sleep sometimes; occasional panic feelings, some sleep issues (better)    Mental Status Exam:   Appearance:   Casual     Behavior:  Appropriate and Sharing  Motor:  Normal  Speech/Language:   Clear and Coherent  Affect:  Depressed and anxious  Mood:  anxious and depressed  Thought process:  normal  Thought content:    WNL  Sensory/Perceptual disturbances:    WNL  Orientation:  oriented to time/date, situation, day of week, month of year and year  Attention:  Good  Concentration:  Good  Memory:  WNL  Fund of knowledge:   Good  Insight:    Good  Judgment:   Good  Impulse Control:  Good    Risk Assessment: Danger to Self:  No Self-injurious Behavior: No Danger to Others: No Duty to Warn:no Physical Aggression / Violence:No  Access to Firearms a concern: No  Gang Involvement:No  Patient / guardian was educated about steps to take if suicide or homicide risk level increases between visits: Denies any SI.   Subject: Patient in today reporting symptoms noted above, and adds that her anxiety is stronger than previously. Found out her husband's cancer has returned even after removing his prostate in February 2020.  Also quite upset after recently learning some industrial development has begun near her home and it will affect her property.  Patient very anxious with both of these issues going on. Anxious, scared about the unknowns and uncertainties.  She does have close friends with whom she talks Talked through her feelings and anxieties about her husband's situation.  Working to accept the situation and stay in the present and not jump ahead.  On a fear scale of 1-10 with 10 being the highest, she  rates herself as an "8".  Talked about strategies to help her stay in the present and manage her emotions re: husband's health concern, especially until his Dr appt Sept 1st.       (Review: Follow up on this next session)  Reports  that "she needs and wants to work on [employer], and is flooded with thoughts and feelings about what will be helpful. and not helpful."  Feels she didn't learn to trust but rather learned to not trust and how to use defense mechanisms. Dad was mean alcoholic and very unpredictable. Other behaviors within her home as she grew up led to lots of distrust. Over the years and 3 marriages, I've learned a lot and have become more self-reliant in some ways but still struggling with trust, fear of failure, and fear of being judged. Mom--always about her and looks, very judgemental. Most fears rooted in childhood and in unhealthy marriages. Very sensitive to any criticism.  Minimal contact with her sister and then it is via texting.  Wants to connect with sister in person but sister not ready yet. Realizing the origins of some of her issues and we looked more closely at trust issues today.  Introduced some strategies that patient can try that can help her develop more trust and also can help her fear of failure and being judged.  Patient seemed trusting in session as she shared lots of information regarding more history and current dynamics playing  out in family.  Will return within 2 wks.   *(info from initial visit - to save with note for now)* Relationship Status: married for 14 yrs and this is 3rd marriage.  1st husband (married 13 yrs and then divorced.  2nd husband committed suicide after being married 6 yrs.  Is a parent with 2 adult children: 62 yr old female (lives in [place]  and 62 yr old female lives in [place].) Strengths:  Supportive Relationships, Conservator, museum/gallery, Able to Communicate Effectively and perseverance, resilient Barriers:  "my history and all I've been through",  sometimes I stand back and just want to not work with anyone."  Added Info:   Patient is 63 yr old caucasian female currently in 24rd marriage.  Dad was alcoholic, "mean alcoholic", committed suicide after having a heart attack.  Mom, 58, is still living and resides in [place]. First marriage ended in divorce, 2nd marriage (husband committed suicide due to "mental health problems", and is currently in her 75th year of marriage to her 49rd husband.  Patient reports current marriage is a happy and healthy one.  Has 2 adult kids (ages 34-son in [place]. And 37-daughter in [place]).  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   Diagnoses:  Mild Recurrent Major Depression   HCL  F33.0   Plan of Care:  Goal review per Flowsheets, and shared progress with patient.Wants to get better and be less depressed and less anxious, and sleep better.  Concerned about trust issues, fear of being judged, and "getting her anxiety and depression under control." Denies any SI or HI.  Return within 1-2 weeks for next session.   Shanon Ace, LCSW

## 2019-03-23 ENCOUNTER — Other Ambulatory Visit: Payer: Self-pay

## 2019-03-23 ENCOUNTER — Ambulatory Visit (INDEPENDENT_AMBULATORY_CARE_PROVIDER_SITE_OTHER): Payer: 59 | Admitting: Psychiatry

## 2019-03-23 DIAGNOSIS — F330 Major depressive disorder, recurrent, mild: Secondary | ICD-10-CM | POA: Diagnosis not present

## 2019-03-23 NOTE — Progress Notes (Signed)
      [facility] Counselor/Therapist Progress Note  Patient ID: [name], MRN: [id number],    Date: 03/23/2019  Time Spent:  60 minutes    1:00pm to 2:00pm  Treatment Type: Individual Therapy  Reported Symptoms:  Fear and anxieties, "worried about husband's recent re-occurrence of cancer. Anxious about finances. Some obsessiveness about "what can I control?"   Mental Status Exam:  Appearance:   Casual     Behavior:  Appropriate and Sharing  Motor:  Normal  Speech/Language:   Clear and Coherent  Affect:  anxious  Mood:  anxious, sad and fear  Thought process:  normal  Thought content:    WNL  Sensory/Perceptual disturbances:    WNL  Orientation:  oriented to person, place, time/date, situation, day of week, month of year and year  Attention:  Good  Concentration:  Good  Memory:  WNL  Fund of knowledge:   Good  Insight:    Good  Judgment:   Good  Impulse Control:  Good   Risk Assessment: Danger to Self:  No Self-injurious Behavior: No Danger to Others: No Duty to Warn:no Physical Aggression / Violence:No  Access to Firearms a concern: No  Gang Involvement:No   Subjective:   Patient in office today reporting the symptoms noted above.  Especially fearful and anxious about the return of her husband's prostate cancer. Trying to focus on being concerned versus "worrying", "but I am very worried".  Angry that husband's cancer has returned, angry about my mom and how unhelpful she can be, and anger at "my insecurities of possibly losing another husband".  Restless, tentative, tired, sleep is ok most nights but some occasional disrupted sleep. What helps her when she is struggling--riding my bike, talking about the situation, praying.  Is already doing some good self-care and to  upgrade to include setting limits on people who are not helpful, eating healthy, and continue her exercise in riding bike. Difficulty dealing with her mother, who is insensitive with patient.  Most of  her support comes from her friends that she has made at her job.  Interventions: Cognitive Behavioral Therapy and Solution-Oriented/Positive Psychology  Diagnosis:   ICD-10-CM   1. Mild recurrent major depression ([place])  F33.0     Plan:    Treatment Goals: Patient not signing the tx plan on computer screen due to [place].  Long term goal: Develop healthy cognitive patterns and beliefs about self and the world that lead to alleviation of depression and anxiety and help prevent relapse.  Progressing:  Patient is motivated. Struggling to focus some due to immediate, unexpected health concern with husband.  Short term goal: Learn and implement personal skills for managing stress, solving daily problems, and resolving conflicts effectively.  Strategy: Teach patient calming skills, problem solving skills, and conflict resolution skills, to better manage daily stressors.  Progressing: Patient in midst of unexpected critical health situation with her husband so calming skills has become the priority and she is motivated.   Next appt within 2 weeks.   Shanon Ace, LCSW

## 2019-03-24 NOTE — Progress Notes (Signed)
[name] [address] [place], [place] [address] Phone: [phone] Subjective:   I [name] am serving as a Education administrator for Dr. Hulan Saas.  I'm seeing this patient by the request  of:    CC: Left leg pain follow-up  [id number]  [name] is a 63 y.o. female coming in with complaint of left hip pain. 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 Pain starts at the hip and radiates down the lateral leg. History of back surgery in 2014. Numbness and tingling with prolonged sitting in the left foot.   Onset- 2 weeks  Location - lateral Duration-  Character- achy  Aggravating factors- turning over in bed Reliving factors-  Therapies tried-  Severity-5 out of 10     Past Medical History:  Diagnosis Date  . Allergy   . Anemia    after birth of child 6yrs ago  . Anxiety and depression    with insomnia  . Blood transfusion without reported diagnosis    x3  . Chronic back pain    ? RA, ? Lupus - reports saw rheumatologist in the past but better now, sees Dr. Trenton Gammon  . Eczema   . GERD (gastroesophageal reflux disease)    takes Omeprazole daily, with nausea  . Heart murmur   . Hemorrhoids   . History of kidney stones    passed on her own  . History of migraine    last one a yr ago and takes Zomig prn  . Hot flashes 11/10/2014  . HTN (hypertension) 03/31/2012  . Hx of echocardiogram    Echo (8/15):  EF 55-60%, no RWMA  . IBS (irritable bowel syndrome)    constipation predominant  . Interstitial cystitis    hx of UTI  . Numbness    both legs and related to back  . Osteoporosis    osteopenia  . Palpitations    on metoprolol in past but made BP too low  . Pneumonia    hx of;last time 88yrs ago  . PONV (postoperative nausea and vomiting)    laryngospasm-1999  . Poor vision 10/15/2015   -? Ocular rosacea -sees opthomology   . Raynaud's disease /phenomenon 10/21/2012   no meds, worse in the winter or cold  environment.   . Rheumatoid aortitis    uses Diclofenac gel;Rheumatoid  . Seasonal allergies    Flonase daily   . Urinary incontinence    Past Surgical History:  Procedure Laterality Date  . ABDOMINAL HYSTERECTOMY  1997  . BREAST BIOPSY  1999   benign  . COLONOSCOPY  02/11/2013 and 12/24/04   internal and external hemorrhoids  . ENDOMETRIAL ABLATION    . EXCISION MORTON'S NEUROMA Right   . FLEXIBLE SIGMOIDOSCOPY  03/07/2008   internal and external hemorrhoids  . POSTERIOR LUMBAR FUSION  05/02/2013   L4-L5 fusion (cage/screws/bone graft) Dr. Annette Stable  . TONSILLECTOMY    . UPPER GASTROINTESTINAL ENDOSCOPY  10/23/2010   gastritis, irregular Z-line  . wisdom teeth extracted     Social History   Socioeconomic History  . Marital status: Married    Spouse name: Not on file  . Number of children: Not on file  . Years of education: Not on file  . Highest education level: Not on file  Occupational History  . Occupation: Programmer, multimedia: [employer]  . Financial resource strain: Not on file  . Food insecurity  Worry: Not on file    Inability: Not on file  . Transportation needs    Medical: Not on file    Non-medical: Not on file  Tobacco Use  . Smoking status: Never Smoker  . Smokeless tobacco: Never Used  Substance and Sexual Activity  . Alcohol use: Yes    Alcohol/week: 2.0 standard drinks    Types: 1 Shots of liquor, 1 Standard drinks or equivalent per week    Comment: one margarita a week  . Drug use: No  . Sexual activity: Not Currently    Partners: Male  Lifestyle  . Physical activity    Days per week: Not on file    Minutes per session: Not on file  . Stress: Not on file  Relationships  . Social Herbalist on phone: Not on file    Gets together: Not on file    Attends religious service: Not on file    Active member of club or organization: Not on file    Attends meetings of clubs or organizations: Not on file    Relationship status: Not  on file  Other Topics Concern  . Not on file  Social History Narrative   Spiritual Beliefs: methodist   Marital status/children/pets: In second marriage.  First husband committed suicide.  2 children.   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 Aspartame And Phenylalanine Other (See Comments)    Migraines  . Beef-Derived Products Other (See Comments)    severe cramping  . Citrus Other (See Comments)    Causes Interstitial cystitis flares  . Penicillins Hives    Denies airway involvement Has patient had a PCN reaction causing immediate rash, facial/tongue/throat swelling, SOB or lightheadedness with hypotension: yes hives.  Has patient had a PCN reaction causing severe rash involving mucus membranes or skin necrosis:no Has patient had a PCN reaction that required hospitalization No Has patient had a PCN reaction occurring within the last 10 years: No If all of the above answers are "NO", then may proceed with Cephalosporin use.   . Promethazine Hcl Other (See Comments)    Muscle twitching and contracture   . Adhesive [Tape]     Red splotches  . Doxycycline Nausea And Vomiting  . Morphine Sulfate Nausea And Vomiting   Family History  Problem Relation Age of Onset  . Coronary artery disease Father   . Heart attack Father   . Hypertension Father   . Depression Father   . Hyperlipidemia Father   . Alcohol abuse Father   . Early death Father   . Kidney disease Father   . Mental illness Father   . Diverticulosis Mother   . Hypertension Mother   . Osteoarthritis Mother   . Depression Mother   . Hyperlipidemia Mother   . Kidney disease Mother   . Alcohol abuse Sister   . Depression Sister   . Diabetes Paternal Uncle   . Hypertension Maternal Grandmother    . Stroke Maternal Grandmother   . Hypertension Maternal Grandfather   . Pancreatic cancer Maternal Grandfather   . Early death Maternal Grandfather   . Stroke Paternal Grandfather   .  Hyperlipidemia Paternal Grandfather   . Alcohol abuse Paternal Grandfather   . Early death Paternal Grandfather   . Hypertension Paternal Grandfather   . Depression Paternal Grandmother   . Dementia Paternal Grandmother   . Arthritis Paternal Grandmother   . Mental illness Paternal Grandmother   . Alcohol abuse Daughter   . Depression Daughter   . Drug abuse Daughter   . Alcohol abuse Son   . Depression Son   . Colon cancer Neg Hx     Current Outpatient Medications (Endocrine & Metabolic):  .  estradiol (VIVELLE-DOT) 0.075 MG/24HR, Place 1 patch onto the skin 2 (two) times a week. Wednesday and Sunday   Current Outpatient Medications (Respiratory):  .  fluticasone (FLONASE) 50 MCG/ACT nasal spray, Place 2 sprays into the nose daily.    Current Outpatient Medications (Other):  Marland Kitchen  ALPRAZolam (XANAX) 0.5 MG tablet, Take 1 tablet (0.5 mg total) by mouth 3 (three) times daily as needed for anxiety or sleep (panic). .  cholecalciferol (VITAMIN D) 1000 UNITS tablet, Take 1,000 Units by mouth every evening. .  clobetasol (TEMOVATE) 0.05 % external solution, APPLY 1 APPLICATION TOPICALLY DAILY AS NEEDED FOR DERMATITIS .  Desvenlafaxine Succinate ER (PRISTIQ) 25 MG TB24, Take 25 mg by mouth at bedtime. Marland Kitchen  FLUoxetine (PROZAC) 40 MG capsule, Take 1 capsule (40 mg total) by mouth daily after breakfast. .  lamoTRIgine (LAMICTAL) 200 MG tablet, Take 1 tablet (200 mg total) by mouth at bedtime. .  Multiple Vitamin (MULTIVITAMIN WITH MINERALS) TABS, Take 1 tablet by mouth every evening. Marland Kitchen  PAZEO 0.7 % SOLN,  .  sucralfate (CARAFATE) 1 GM/10ML suspension, TAKE 10 MLS BY MOUTH 3 TIMES DAILY AS NEEDED FOR DIGESTION    Past medical history, social, surgical and family history all reviewed in electronic medical  record.  No pertanent information unless stated regarding to the chief complaint.   Review of Systems:  No headache, visual changes, nausea, vomiting, diarrhea, constipation, dizziness, abdominal pain, skin rash, fevers, chills, night sweats, weight loss, swollen lymph nodes, body aches, joint swelling,  chest pain, shortness of breath, mood changes.  Positive muscle aches  Objective  Blood pressure 130/78, pulse 75, height 5\' 2"  (1.575 m), weight 122 lb (55.3 kg), SpO2 98 %.    General: No apparent distress alert and oriented x3 mood and affect normal, dressed appropriately.  HEENT: Pupils equal, extraocular movements intact  Respiratory: Patient's speak in full sentences and does not appear short of breath  Cardiovascular: No lower extremity edema, non tender, no erythema  Skin: Warm dry intact with no signs of infection or rash on extremities or on axial skeleton.  Abdomen: Soft nontender  Neuro: Cranial nerves II through XII are intact, neurovascularly intact in all extremities with 2+ DTRs and 2+ pulses.  Lymph: No lymphadenopathy of posterior or anterior cervical chain or axillae bilaterally.  Gait normal with good balance and coordination.  MSK:  Non tender with full range of motion and good stability and symmetric strength and tone of shoulders, elbows, wrist, hip, knee and ankles bilaterally.  Patient back exam has some very mild tightness of the lumbar spine.  Patient does have a positive Corky Sox on the left side.  Severely tender to palpation over the greater trochanteric area in the piriformis on the left.  Neurovascular intact distally with a negative straight leg test.  Deep tendon reflexes intact 2+ bilaterally.   Procedure: Real-time Ultrasound Guided Injection of left  greater trochanteric bursitis secondary to patient's body  habitus Device: GE Logiq Q7  Ultrasound guided injection is preferred based studies that show increased duration, increased effect, greater accuracy,  decreased procedural pain, increased response rate, and decreased cost with ultrasound guided versus blind injection.  Verbal informed consent obtained.  Time-out conducted.  Noted no overlying erythema, induration, or other signs of local infection.  Skin prepped in a sterile fashion.  Local anesthesia: Topical Ethyl chloride.  With sterile technique and under real time ultrasound guidance:  Greater trochanteric area was visualized and patient's bursa was noted. A 22-gauge 3 inch needle was inserted and 4 cc of 0.5% Marcaine and 1 cc of Kenalog 40 mg/dL was injected. Pictures taken Completed without difficulty  Pain immediately resolved suggesting accurate placement of the medication.  Advised to call if fevers/chills, erythema, induration, drainage, or persistent bleeding.  Images permanently stored and available for review in the ultrasound unit.  Impression: Technically successful ultrasound guided injection.  [id number]; 15 additional minutes spent for Therapeutic exercises as stated in above notes.  This included exercises focusing on stretching, strengthening, with significant focus on eccentric aspects.   Long term goals include an improvement in range of motion, strength, endurance as well as avoiding reinjury. Patient's frequency would include in 1-2 times a day, 3-5 times a week for a duration of 6-12 weeks. Low back exercises that included:  Pelvic tilt/bracing instruction to focus on control of the pelvic girdle and lower abdominal muscles  Glute strengthening exercises, focusing on proper firing of the glutes without engaging the low back muscles Proper stretching techniques for maximum relief for the hamstrings, hip flexors, low back and some rotation where tolerated   Proper technique shown and discussed handout in great detail with ATC.  All questions were discussed and answered.     Impression and Recommendations:     This case required medical decision making of moderate  complexity. The above documentation has been reviewed and is accurate and complete Lyndal Pulley, DO       Note: This dictation was prepared with Dragon dictation along with smaller phrase technology. Any transcriptional errors that result from this process are unintentional.

## 2019-03-25 ENCOUNTER — Ambulatory Visit: Payer: Self-pay

## 2019-03-25 ENCOUNTER — Ambulatory Visit: Payer: 59 | Admitting: Family Medicine

## 2019-03-25 ENCOUNTER — Encounter: Payer: Self-pay | Admitting: Family Medicine

## 2019-03-25 ENCOUNTER — Other Ambulatory Visit: Payer: Self-pay

## 2019-03-25 VITALS — BP 130/78 | HR 75 | Ht 62.0 in | Wt 122.0 lb

## 2019-03-25 DIAGNOSIS — M25552 Pain in left hip: Secondary | ICD-10-CM | POA: Diagnosis not present

## 2019-03-25 DIAGNOSIS — M7062 Trochanteric bursitis, left hip: Secondary | ICD-10-CM | POA: Diagnosis not present

## 2019-03-25 DIAGNOSIS — M48062 Spinal stenosis, lumbar region with neurogenic claudication: Secondary | ICD-10-CM | POA: Diagnosis not present

## 2019-03-25 HISTORY — DX: Trochanteric bursitis, left hip: M70.62

## 2019-03-25 NOTE — Patient Instructions (Signed)
Good to see you Ice 20 mins twice a day Exercise 3 times a week Ok to start biking after weekend See me again in 4-6 weeks if not resolved

## 2019-03-25 NOTE — Assessment & Plan Note (Signed)
Patient given injection today, tolerated the procedure well, discussed which activities of doing which was to avoid.  Patient differential includes a lumbar radiculopathy with history of spinal stenosis but leg negative straight leg test.  No change in medications, discussed topical anti-inflammatories, given home exercises.  Piriformis syndrome is also in the differential.  Follow-up again in 4 to 8 weeks

## 2019-03-30 ENCOUNTER — Other Ambulatory Visit: Payer: Self-pay | Admitting: Obstetrics & Gynecology

## 2019-03-30 DIAGNOSIS — Z1231 Encounter for screening mammogram for malignant neoplasm of breast: Secondary | ICD-10-CM

## 2019-04-04 ENCOUNTER — Ambulatory Visit: Payer: 59 | Admitting: Psychiatry

## 2019-04-06 ENCOUNTER — Ambulatory Visit: Payer: 59 | Admitting: Psychiatry

## 2019-04-20 ENCOUNTER — Ambulatory Visit: Payer: 59 | Admitting: Psychiatry

## 2019-04-21 NOTE — Progress Notes (Signed)
[name] [place],  [address] Phone: [phone] Subjective:   [name], am serving as a scribe for Dr. Hulan Saas.  I'm seeing this patient by the request  of:    CC: Back pain, hip pain follow-up  [id number]   03/25/2019 Patient given injection today, tolerated the procedure well, discussed which activities of doing which was to avoid.  Patient differential includes a lumbar radiculopathy with history of spinal stenosis but leg negative straight leg test.  No change in medications, discussed topical anti-inflammatories, given home exercises.  Piriformis syndrome is also in the differential.  Follow-up again in 4 to 8 weeks  Update 04/22/2019 ERSEL TAHIR is a 63 y.o. female coming in with complaint of left hip pain. Patient states that she is still having left sided glute. Has been riding her bike 3 days a week 30 miles each day. Does not have pain during her rides. Being on her feet during a busy day at work her pain increases. Also notes sitting for prolonged periods gives her dull, achy pain. Has been using Ephraim Hamburger for pain relief.  Patient describes the pain as a dull, throbbing aching pain.  Patient states that it is affecting daily activities.  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  History of the fusion of the back in 2014.       Past Medical History:  Diagnosis Date  . Allergy   . Anemia    after birth of child 20yrs ago  . Anxiety and depression    with insomnia  . Blood transfusion without reported diagnosis    x3  . Chronic back pain    ? RA, ? Lupus - reports saw rheumatologist in the past but better now, sees Dr. Trenton Gammon  . Eczema   . GERD (gastroesophageal reflux disease)    takes Omeprazole daily, with nausea  . Heart murmur   . Hemorrhoids   . History of kidney stones    passed on her own  . History of migraine    last one a yr ago and  takes Zomig prn  . Hot flashes 11/10/2014  . HTN (hypertension) 03/31/2012  . Hx of echocardiogram    Echo (8/15):  EF 55-60%, no RWMA  . IBS (irritable bowel syndrome)    constipation predominant  . Interstitial cystitis    hx of UTI  . Numbness    both legs and related to back  . Osteoporosis    osteopenia  . Palpitations    on metoprolol in past but made BP too low  . Pneumonia    hx of;last time 63yrs ago  . PONV (postoperative nausea and vomiting)    laryngospasm-1999  . Poor vision 10/15/2015   -? Ocular rosacea -sees opthomology   . Raynaud's disease /phenomenon 10/21/2012   no meds, worse in the winter or cold environment.   . Rheumatoid aortitis    uses Diclofenac gel;Rheumatoid  . Seasonal allergies    Flonase daily   . Urinary incontinence    Past Surgical History:  Procedure Laterality Date  . ABDOMINAL HYSTERECTOMY  1997  . BREAST BIOPSY  1999   benign  . COLONOSCOPY  02/11/2013 and 12/24/04   internal and external hemorrhoids  . ENDOMETRIAL ABLATION    . EXCISION MORTON'S NEUROMA Right   . FLEXIBLE SIGMOIDOSCOPY  03/07/2008   internal and external hemorrhoids  . POSTERIOR  LUMBAR FUSION  05/02/2013   L4-L5 fusion (cage/screws/bone graft) Dr. Annette Stable  . TONSILLECTOMY    . UPPER GASTROINTESTINAL ENDOSCOPY  10/23/2010   gastritis, irregular Z-line  . wisdom teeth extracted     Social History   Socioeconomic History  . Marital status: Married    Spouse name: Not on file  . Number of children: Not on file  . Years of education: Not on file  . Highest education level: Not on file  Occupational History  . Occupation: Programmer, multimedia: [employer]  . Financial resource strain: Not on file  . Food insecurity    Worry: Not on file    Inability: Not on file  . Transportation needs    Medical: Not on file    Non-medical: Not on file  Tobacco Use  . Smoking status: Never Smoker  . Smokeless tobacco: Never Used  Substance and Sexual Activity  .  Alcohol use: Yes    Alcohol/week: 2.0 standard drinks    Types: 1 Shots of liquor, 1 Standard drinks or equivalent per week    Comment: one margarita a week  . Drug use: No  . Sexual activity: Not Currently    Partners: Male  Lifestyle  . Physical activity    Days per week: Not on file    Minutes per session: Not on file  . Stress: Not on file  Relationships  . Social Herbalist on phone: Not on file    Gets together: Not on file    Attends religious service: Not on file    Active member of club or organization: Not on file    Attends meetings of clubs or organizations: Not on file    Relationship status: Not on file  Other Topics Concern  . Not on file  Social History Narrative   Spiritual Beliefs: methodist   Marital status/children/pets: In second marriage.  First husband committed suicide.  2 children.   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 Aspartame And Phenylalanine Other (See Comments)    Migraines  . Beef-Derived Products Other (See Comments)    severe cramping  . Citrus Other (See Comments)    Causes Interstitial cystitis flares  . Penicillins Hives    Denies airway involvement Has patient had a PCN reaction causing immediate rash, facial/tongue/throat swelling, SOB or lightheadedness with hypotension: yes hives.  Has patient had a PCN reaction causing severe rash involving mucus membranes or skin necrosis:no Has patient had a PCN reaction that required hospitalization No Has patient had a PCN reaction occurring within the last 10 years: No If all of the above answers are "NO", then may proceed with Cephalosporin use.   . Promethazine Hcl Other (See Comments)    Muscle twitching and contracture   . Adhesive [Tape]     Red  splotches  . Doxycycline Nausea And Vomiting  . Morphine Sulfate Nausea And Vomiting   Family History  Problem Relation Age of Onset  . Coronary artery disease Father   . Heart attack Father   . Hypertension Father   . Depression Father   .  Hyperlipidemia Father   . Alcohol abuse Father   . Early death Father   . Kidney disease Father   . Mental illness Father   . Diverticulosis Mother   . Hypertension Mother   . Osteoarthritis Mother   . Depression Mother   . Hyperlipidemia Mother   . Kidney disease Mother   . Alcohol abuse Sister   . Depression Sister   . Diabetes Paternal Uncle   . Hypertension Maternal Grandmother   . Stroke Maternal Grandmother   . Hypertension Maternal Grandfather   . Pancreatic cancer Maternal Grandfather   . Early death Maternal Grandfather   . Stroke Paternal Grandfather   . Hyperlipidemia Paternal Grandfather   . Alcohol abuse Paternal Grandfather   . Early death Paternal Grandfather   . Hypertension Paternal Grandfather   . Depression Paternal Grandmother   . Dementia Paternal Grandmother   . Arthritis Paternal Grandmother   . Mental illness Paternal Grandmother   . Alcohol abuse Daughter   . Depression Daughter   . Drug abuse Daughter   . Alcohol abuse Son   . Depression Son   . Colon cancer Neg Hx     Current Outpatient Medications (Endocrine & Metabolic):  .  estradiol (VIVELLE-DOT) 0.075 MG/24HR, Place 1 patch onto the skin 2 (two) times a week. Wednesday and Sunday   Current Outpatient Medications (Respiratory):  .  fluticasone (FLONASE) 50 MCG/ACT nasal spray, Place 2 sprays into the nose daily.    Current Outpatient Medications (Other):  Marland Kitchen  ALPRAZolam (XANAX) 0.5 MG tablet, Take 1 tablet (0.5 mg total) by mouth 3 (three) times daily as needed for anxiety or sleep (panic). .  cholecalciferol (VITAMIN D) 1000 UNITS tablet, Take 1,000 Units by mouth every evening. .  clobetasol (TEMOVATE) 0.05 % external solution, APPLY 1  APPLICATION TOPICALLY DAILY AS NEEDED FOR DERMATITIS .  Desvenlafaxine Succinate ER (PRISTIQ) 25 MG TB24, Take 25 mg by mouth at bedtime. Marland Kitchen  FLUoxetine (PROZAC) 40 MG capsule, Take 1 capsule (40 mg total) by mouth daily after breakfast. .  lamoTRIgine (LAMICTAL) 200 MG tablet, Take 1 tablet (200 mg total) by mouth at bedtime. .  Multiple Vitamin (MULTIVITAMIN WITH MINERALS) TABS, Take 1 tablet by mouth every evening. Marland Kitchen  PAZEO 0.7 % SOLN,  .  sucralfate (CARAFATE) 1 GM/10ML suspension, TAKE 10 MLS BY MOUTH 3 TIMES DAILY AS NEEDED FOR DIGESTION    Past medical history, social, surgical and family history all reviewed in electronic medical record.  No pertanent information unless stated regarding to the chief complaint.   Review of Systems:  No headache, visual changes, nausea, vomiting, diarrhea, constipation, dizziness, abdominal pain, skin rash, fevers, chills, night sweats, weight loss, swollen lymph nodes, body aches, joint swelling,  chest pain, shortness of breath, mood changes.  Positive muscle aches  Objective  Blood pressure 106/72, pulse (!) 47, height 5\' 2"  (1.575 m), weight 120 lb (54.4 kg), SpO2 98 %.    General: No apparent distress alert and oriented x3 mood and affect normal, dressed appropriately.  HEENT: Pupils equal, extraocular movements intact  Respiratory: Patient's speak in full sentences and does not appear short of breath  Cardiovascular: No lower extremity edema, non tender, no erythema  Skin: Warm dry intact with no signs of infection or rash on extremities or on axial skeleton.  Abdomen: Soft nontender  Neuro: Cranial nerves II through XII are intact, neurovascularly intact in all extremities with 2+ DTRs and 2+ pulses.  Lymph: No lymphadenopathy of posterior  or anterior cervical chain or axillae bilaterally.  Gait mild antalgic MSK:  Non tender with full range of motion and good stability and symmetric strength and tone of shoulders, elbows, wrist,  knee and  ankles bilaterally.  Back exam does have loss of loss of lordosis, mild positive straight leg test on the left side.  Mild weakness of 4 out of 5 strength in the hip abductors and hip flexion. Impression and Recommendations:     This case required medical decision making of moderate complexity. The above documentation has been reviewed and is accurate and complete Lyndal Pulley, DO       Note: This dictation was prepared with Dragon dictation along with smaller phrase technology. Any transcriptional errors that result from this process are unintentional.

## 2019-04-22 ENCOUNTER — Other Ambulatory Visit: Payer: Self-pay

## 2019-04-22 ENCOUNTER — Ambulatory Visit: Payer: 59 | Admitting: Family Medicine

## 2019-04-22 ENCOUNTER — Encounter: Payer: Self-pay | Admitting: Family Medicine

## 2019-04-22 VITALS — BP 106/72 | HR 47 | Ht 62.0 in | Wt 120.0 lb

## 2019-04-22 DIAGNOSIS — M48062 Spinal stenosis, lumbar region with neurogenic claudication: Secondary | ICD-10-CM | POA: Diagnosis not present

## 2019-04-22 DIAGNOSIS — M549 Dorsalgia, unspecified: Secondary | ICD-10-CM

## 2019-04-22 NOTE — Patient Instructions (Signed)
Good to see you Referral sent in. Write me after you discuss with Dr. Maryjean Ka

## 2019-04-22 NOTE — Assessment & Plan Note (Addendum)
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  We discussed the possibility of epidurals versus nerve root injections.  Possible candidate for radiofrequency ablation.  MRI did show adjacent segment disease.  Patient has elected to be referred to Dr. Maryjean Ka for further evaluation and treatment.  No changes in medications.  Follow-up with me again as needed.

## 2019-04-25 MED FILL — OLOPATADINE HCL 0.2 % SOLN: 0.2 | 25 days supply | Qty: 3 | Fill #0

## 2019-04-27 ENCOUNTER — Encounter: Payer: Self-pay | Admitting: Family Medicine

## 2019-04-27 ENCOUNTER — Other Ambulatory Visit: Payer: Self-pay

## 2019-04-27 ENCOUNTER — Ambulatory Visit (INDEPENDENT_AMBULATORY_CARE_PROVIDER_SITE_OTHER): Payer: 59 | Admitting: Family Medicine

## 2019-04-27 VITALS — BP 133/84 | HR 74 | Temp 98.0°F | Resp 18 | Ht 63.75 in | Wt 122.2 lb

## 2019-04-27 DIAGNOSIS — Z79899 Other long term (current) drug therapy: Secondary | ICD-10-CM

## 2019-04-27 DIAGNOSIS — G479 Sleep disorder, unspecified: Secondary | ICD-10-CM

## 2019-04-27 DIAGNOSIS — F4310 Post-traumatic stress disorder, unspecified: Secondary | ICD-10-CM

## 2019-04-27 DIAGNOSIS — R350 Frequency of micturition: Secondary | ICD-10-CM

## 2019-04-27 DIAGNOSIS — Z131 Encounter for screening for diabetes mellitus: Secondary | ICD-10-CM

## 2019-04-27 DIAGNOSIS — Z0000 Encounter for general adult medical examination without abnormal findings: Secondary | ICD-10-CM | POA: Diagnosis not present

## 2019-04-27 DIAGNOSIS — Z23 Encounter for immunization: Secondary | ICD-10-CM

## 2019-04-27 DIAGNOSIS — F330 Major depressive disorder, recurrent, mild: Secondary | ICD-10-CM | POA: Diagnosis not present

## 2019-04-27 DIAGNOSIS — R002 Palpitations: Secondary | ICD-10-CM

## 2019-04-27 DIAGNOSIS — L309 Dermatitis, unspecified: Secondary | ICD-10-CM

## 2019-04-27 DIAGNOSIS — E041 Nontoxic single thyroid nodule: Secondary | ICD-10-CM

## 2019-04-27 DIAGNOSIS — F410 Panic disorder [episodic paroxysmal anxiety] without agoraphobia: Secondary | ICD-10-CM | POA: Diagnosis not present

## 2019-04-27 DIAGNOSIS — Z79890 Hormone replacement therapy (postmenopausal): Secondary | ICD-10-CM | POA: Diagnosis not present

## 2019-04-27 DIAGNOSIS — R32 Unspecified urinary incontinence: Secondary | ICD-10-CM | POA: Diagnosis not present

## 2019-04-27 DIAGNOSIS — Z13220 Encounter for screening for lipoid disorders: Secondary | ICD-10-CM | POA: Diagnosis not present

## 2019-04-27 LAB — CBC WITH DIFFERENTIAL/PLATELET
Basophils Absolute: 0 10*3/uL (ref 0.0–0.1)
Basophils Relative: 0.8 % (ref 0.0–3.0)
Eosinophils Absolute: 0.2 10*3/uL (ref 0.0–0.7)
Eosinophils Relative: 4.1 % (ref 0.0–5.0)
HCT: 43.8 % (ref 36.0–46.0)
Hemoglobin: 14.3 g/dL (ref 12.0–15.0)
Lymphocytes Relative: 25.3 % (ref 12.0–46.0)
Lymphs Abs: 1 10*3/uL (ref 0.7–4.0)
MCHC: 32.6 g/dL (ref 30.0–36.0)
MCV: 94.8 fl (ref 78.0–100.0)
Monocytes Absolute: 0.4 10*3/uL (ref 0.1–1.0)
Monocytes Relative: 8.7 % (ref 3.0–12.0)
Neutro Abs: 2.5 10*3/uL (ref 1.4–7.7)
Neutrophils Relative %: 61.1 % (ref 43.0–77.0)
Platelets: 204 10*3/uL (ref 150.0–400.0)
RBC: 4.63 Mil/uL (ref 3.87–5.11)
RDW: 14.1 % (ref 11.5–15.5)
WBC: 4.1 10*3/uL (ref 4.0–10.5)

## 2019-04-27 LAB — LIPID PANEL
Cholesterol: 212 mg/dL — ABNORMAL HIGH (ref 0–200)
HDL: 78.4 mg/dL (ref >39.00)
LDL Cholesterol: 122 mg/dL — ABNORMAL HIGH (ref 0–99)
NonHDL: 133.99
Total CHOL/HDL Ratio: 3
Triglycerides: 61 mg/dL (ref 0.0–149.0)
VLDL: 12.2 mg/dL (ref 0.0–40.0)

## 2019-04-27 LAB — COMPREHENSIVE METABOLIC PANEL
ALT: 14 U/L (ref 0–35)
AST: 21 U/L (ref 0–37)
Albumin: 4.4 g/dL (ref 3.5–5.2)
Alkaline Phosphatase: 72 U/L (ref 39–117)
BUN: 12 mg/dL (ref 6–23)
CO2: 30 mEq/L (ref 19–32)
Calcium: 9.4 mg/dL (ref 8.4–10.5)
Chloride: 103 mEq/L (ref 96–112)
Creatinine, Ser: 0.89 mg/dL (ref 0.40–1.20)
GFR: 64.12 mL/min (ref >60.00)
Glucose, Bld: 86 mg/dL (ref 70–99)
Potassium: 5 mEq/L (ref 3.5–5.1)
Sodium: 139 mEq/L (ref 135–145)
Total Bilirubin: 0.5 mg/dL (ref 0.2–1.2)
Total Protein: 6.4 g/dL (ref 6.0–8.3)

## 2019-04-27 LAB — HEMOGLOBIN A1C: Hgb A1c MFr Bld: 5.9 % (ref 4.6–6.5)

## 2019-04-27 LAB — TSH: TSH: 1.52 u[IU]/mL (ref 0.35–4.50)

## 2019-04-27 MED ORDER — CLOBETASOL PROPIONATE 0.05 % EX SOLN: CUTANEOUS | 2 refills | Status: DC

## 2019-04-27 MED FILL — CLOBETASOL 0.05% SOLUTION: 0.05 | 30 days supply | Qty: 50 | Fill #0

## 2019-04-27 NOTE — Patient Instructions (Signed)
Health Maintenance, Female Adopting a healthy lifestyle and getting preventive care are important in promoting health and wellness. Ask your health care provider about:  The right schedule for you to have regular tests and exams.  Things you can do on your own to prevent diseases and keep yourself healthy. What should I know about diet, weight, and exercise? Eat a healthy diet   Eat a diet that includes plenty of vegetables, fruits, low-fat dairy products, and lean protein.  Do not eat a lot of foods that are high in solid fats, added sugars, or sodium. Maintain a healthy weight Body mass index (BMI) is used to identify weight problems. It estimates body fat based on height and weight. Your health care provider can help determine your BMI and help you achieve or maintain a healthy weight. Get regular exercise Get regular exercise. This is one of the most important things you can do for your health. Most adults should:  Exercise for at least 150 minutes each week. The exercise should increase your heart rate and make you sweat (moderate-intensity exercise).  Do strengthening exercises at least twice a week. This is in addition to the moderate-intensity exercise.  Spend less time sitting. Even light physical activity can be beneficial. Watch cholesterol and blood lipids Have your blood tested for lipids and cholesterol at 62 years of age, then have this test every 5 years. Have your cholesterol levels checked more often if:  Your lipid or cholesterol levels are high.  You are older than 62 years of age.  You are at high risk for heart disease. What should I know about cancer screening? Depending on your health history and family history, you may need to have cancer screening at various ages. This may include screening for:  Breast cancer.  Cervical cancer.  Colorectal cancer.  Skin cancer.  Lung cancer. What should I know about heart disease, diabetes, and high blood  pressure? Blood pressure and heart disease  High blood pressure causes heart disease and increases the risk of stroke. This is more likely to develop in people who have high blood pressure readings, are of African descent, or are overweight.  Have your blood pressure checked: ? Every 3-5 years if you are 18-39 years of age. ? Every year if you are 40 years old or older. Diabetes Have regular diabetes screenings. This checks your fasting blood sugar level. Have the screening done:  Once every three years after age 40 if you are at a normal weight and have a low risk for diabetes.  More often and at a younger age if you are overweight or have a high risk for diabetes. What should I know about preventing infection? Hepatitis B If you have a higher risk for hepatitis B, you should be screened for this virus. Talk with your health care provider to find out if you are at risk for hepatitis B infection. Hepatitis C Testing is recommended for:  Everyone born from 1945 through 1965.  Anyone with known risk factors for hepatitis C. Sexually transmitted infections (STIs)  Get screened for STIs, including gonorrhea and chlamydia, if: ? You are sexually active and are younger than 62 years of age. ? You are older than 62 years of age and your health care provider tells you that you are at risk for this type of infection. ? Your sexual activity has changed since you were last screened, and you are at increased risk for chlamydia or gonorrhea. Ask your health care provider if   you are at risk.  Ask your health care provider about whether you are at high risk for HIV. Your health care provider may recommend a prescription medicine to help prevent HIV infection. If you choose to take medicine to prevent HIV, you should first get tested for HIV. You should then be tested every 3 months for as long as you are taking the medicine. Pregnancy  If you are about to stop having your period (premenopausal) and  you may become pregnant, seek counseling before you get pregnant.  Take 400 to 800 micrograms (mcg) of folic acid every day if you become pregnant.  Ask for birth control (contraception) if you want to prevent pregnancy. Osteoporosis and menopause Osteoporosis is a disease in which the bones lose minerals and strength with aging. This can result in bone fractures. If you are 65 years old or older, or if you are at risk for osteoporosis and fractures, ask your health care provider if you should:  Be screened for bone loss.  Take a calcium or vitamin D supplement to lower your risk of fractures.  Be given hormone replacement therapy (HRT) to treat symptoms of menopause. Follow these instructions at home: Lifestyle  Do not use any products that contain nicotine or tobacco, such as cigarettes, e-cigarettes, and chewing tobacco. If you need help quitting, ask your health care provider.  Do not use street drugs.  Do not share needles.  Ask your health care provider for help if you need support or information about quitting drugs. Alcohol use  Do not drink alcohol if: ? Your health care provider tells you not to drink. ? You are pregnant, may be pregnant, or are planning to become pregnant.  If you drink alcohol: ? Limit how much you use to 0-1 drink a day. ? Limit intake if you are breastfeeding.  Be aware of how much alcohol is in your drink. In the [place] one drink equals one 12 oz bottle of beer (355 mL), one 5 oz glass of wine (148 mL), or one 1 oz glass of hard liquor (44 mL). General instructions  Schedule regular health, dental, and eye exams.  Stay current with your vaccines.  Tell your health care provider if: ? You often feel depressed. ? You have ever been abused or do not feel safe at home. Summary  Adopting a healthy lifestyle and getting preventive care are important in promoting health and wellness.  Follow your health care provider's instructions about healthy  diet, exercising, and getting tested or screened for diseases.  Follow your health care provider's instructions on monitoring your cholesterol and blood pressure. This information is not intended to replace advice given to you by your health care provider. Make sure you discuss any questions you have with your health care provider. Document Released: 01/13/2011 Document Revised: 06/23/2018 Document Reviewed: 06/23/2018 Elsevier Patient Education  2020 [employer]  

## 2019-04-27 NOTE — Progress Notes (Signed)
Patient ID: [name], female  DOB: 11-11-1955, 63 y.o.   MRN: [id number] Patient Care Team    Relationship Specialty Notifications Start End  Ma Hillock, DO PCP - General Family Medicine  12/29/18   Gavin Pound, MD Consulting Physician Rheumatology  04/09/17   Maisie Fus, MD Consulting Physician Obstetrics and Gynecology  04/09/17   Devra Dopp, MD Referring Physician Dermatology  04/09/17   [facility], [facility]  12/29/18    Comment: [facility]    12/29/18   [name], MD Consulting Physician Gastroenterology  12/29/18   Earnie Larsson, MD Consulting Physician Neurosurgery  12/29/18     Chief Complaint  Patient presents with   Annual Exam    Fasting. Last pap 2018, Last Mammogram 06/2018. Pt continues to have more urinary leaking. Pt would like another thyroid ultrasound done to check nodules if due.     Subjective:  [name] is a 63 y.o.  Female  present for CPE. All past medical history, surgical history, allergies, family history, immunizations, medications and social history were updated in the electronic medical record today. All recent labs, ED visits and hospitalizations within the last year were reviewed.  Hormone replacement therapy Patient is on hormone replacement therapy through her gynecologist.  Eczema:  She is prescribed steroid cream for her eczema through her dermatologist.  She has not been able to see them in quite some and would like refills on temovate solution.    Urinary frequency: Patient reports the past 5 days she has had increased urinary frequency.  She is concerned she is getting a urinary tract infection.  She denies nausea, vomiting, fevers or chills.  She denies burning with urination.  Thyroid nodule: Patient reports thyroid nodule present since 2015.  Reviewed thyroid ultrasound in EMR today.  Results-recommendations suggest repeat ultrasound in  12 months.  Patient reports she has had no repeat ultrasound completed.  She denies neck discomfort, dysphagia or notable mass.  She would like to have study completed at [facility] imaging more prior imaging was completed.  Health maintenance:  Colonoscopy: completed 2014, by [name]. follow up 10 years. Mammogram: completed: Patient has scheduled for 06/20/2019.  Cervical cancer screening: last pap: Patient reports her last Pap was in 2018, requesting records. Immunizations: tdap 2015, Influenza 2020 UTD, shingrix series started today.  Infectious disease screening: HIV completed  DEXA: GYN completed. 2020, per patient. Assistive device: None Oxygen use: None Patient has a Dental home. Hospitalizations/ED visits: Reviewed  Depression screen [facility] 2/9 04/27/2019 12/29/2018 04/20/2018 04/09/2017  Decreased Interest 0 2 0 0  Down, Depressed, Hopeless 0 3 0 0  PHQ - 2 Score 0 5 0 0  Altered sleeping - _0 Tired, decreased energy - 1 2 0  Change in appetite - 3 0 0  Feeling bad or failure about yourself  - 1 0 0  Trouble concentrating - 2 0 0  Moving slowly or fidgety/restless - 2 0 0  Suicidal thoughts - 1 0 0  PHQ-9 Score - _1 Difficult doing work/chores - Extremely dIfficult - -  Some recent data might be hidden   GAD 7 : Generalized Anxiety Score 04/27/2019 12/29/2018  Nervous, Anxious, on Edge 1 3  Control/stop worrying 2 3  Worry too much - different things 2 3  Trouble relaxing 1 2  Restless 1 2  Easily annoyed or  irritable 2 3  Afraid - awful might happen 2 2  Total GAD 7 Score 11 18  Anxiety Difficulty Somewhat difficult Extremely difficult   Immunization History  Administered Date(s) Administered   Influenza,inj,Quad PF,6+ Mos 04/12/2014   Influenza-Unspecified 05/15/2015, 03/28/2018   Td 07/15/2003   Zoster Recombinat (Shingrix) 04/27/2019   Past Medical History:  Diagnosis Date   Allergy    Anemia    after birth of child 33yr ago   Anxiety and  depression    with insomnia   Blood transfusion without reported diagnosis    x3   Chronic back pain    ? RA, ? Lupus - reports saw rheumatologist in the past but better now, sees Dr. PTrenton Gammon  Eczema    GERD (gastroesophageal reflux disease)    takes Omeprazole daily, with nausea   Heart murmur    Hemorrhoids    History of kidney stones    passed on her own   History of migraine    last one a yr ago and takes Zomig prn   Hot flashes 11/10/2014   HTN (hypertension) 03/31/2012   Hx of echocardiogram    Echo (8/15):  EF 55-60%, no RWMA   IBS (irritable bowel syndrome)    constipation predominant   Interstitial cystitis    hx of UTI   Numbness    both legs and related to back   Osteoporosis    osteopenia   Palpitations    on metoprolol in past but made BP too low   Pneumonia    hx of;last time 160yrago   PONV (postoperative nausea and vomiting)    laryngospasm-1999   Poor vision 10/15/2015   -? Ocular rosacea -sees opthomology    Raynaud's disease /phenomenon 10/21/2012   no meds, worse in the winter or cold environment.    Rheumatoid aortitis    uses Diclofenac gel;Rheumatoid   Seasonal allergies    Flonase daily    Urinary incontinence    Allergies  Allergen Reactions   Aspartame And Phenylalanine Other (See Comments)    Migraines   Beef-Derived Products Other (See Comments)    severe cramping   Citrus Other (See Comments)    Causes Interstitial cystitis flares   Penicillins Hives    Denies airway involvement Has patient had a PCN reaction causing immediate rash, facial/tongue/throat swelling, SOB or lightheadedness with hypotension: yes hives.  Has patient had a PCN reaction causing severe rash involving mucus membranes or skin necrosis:no Has patient had a PCN reaction that required hospitalization No Has patient had a PCN reaction occurring within the last 10 years: No If all of the above answers are "NO", then may proceed with  Cephalosporin use.    Promethazine Hcl Other (See Comments)    Muscle twitching and contracture    Adhesive [Tape]     Red splotches   Doxycycline Nausea And Vomiting   Morphine Sulfate Nausea And Vomiting   Past Surgical History:  Procedure Laterality Date   ABDOMINAL HYSTERECTOMY  1997   BREAST BIOPSY  1999   benign   COLONOSCOPY  02/11/2013 and 12/24/04   internal and external hemorrhoids   ENDOMETRIAL ABLATION     EXCISION MORTON'S NEUROMA Right    FLEXIBLE SIGMOIDOSCOPY  03/07/2008   internal and external hemorrhoids   POSTERIOR LUMBAR FUSION  05/02/2013   L4-L5 fusion (cage/screws/bone graft) Dr. PoAnnette Stable TONSILLECTOMY     UPPER GASTROINTESTINAL ENDOSCOPY  10/23/2010   gastritis, irregular Z-line  wisdom teeth extracted     Family History  Problem Relation Age of Onset   Coronary artery disease Father    Heart attack Father    Hypertension Father    Depression Father    Hyperlipidemia Father    Alcohol abuse Father    Early death Father    Kidney disease Father    Mental illness Father    Diverticulosis Mother    Hypertension Mother    Osteoarthritis Mother    Depression Mother    Hyperlipidemia Mother    Kidney disease Mother    Alcohol abuse Sister    Depression Sister    Diabetes Paternal Uncle    Hypertension Maternal Grandmother    Stroke Maternal Grandmother    Hypertension Maternal Grandfather    Pancreatic cancer Maternal Grandfather    Early death Maternal Grandfather    Stroke Paternal Grandfather    Hyperlipidemia Paternal Grandfather    Alcohol abuse Paternal Grandfather    Early death Paternal Grandfather    Hypertension Paternal Grandfather    Depression Paternal Grandmother    Dementia Paternal Grandmother    Arthritis Paternal Grandmother    Mental illness Paternal Grandmother    Alcohol abuse Daughter    Depression Daughter    Drug abuse Daughter    Alcohol abuse Son    Depression Son     Colon cancer Neg Hx    Social History   Social History Narrative   Spiritual Beliefs: methodist   Marital status/children/pets: In second marriage.  First husband committed suicide.  2 children.   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  Has patient had a PCN reaction causing severe rash involving mucus membranes or skin necrosis:no Has patient had a PCN reaction that required hospitalization No Has patient had a PCN reaction occurring within the last 10 years: No If all of the above answers are "NO", then may proceed with Cephalosporin use.   Promethazine Hcl Other (See Comments)   Muscle twitching and contracture    Adhesive [tape]    Red splotches   Doxycycline Nausea And Vomiting   Morphine Sulfate Nausea And Vomiting      Medication List       Accurate as of April 27, 2019 11:59 PM. If you have any questions, ask your nurse or doctor.        STOP taking these medications   Desvenlafaxine Succinate ER 25 MG Tb24 Commonly known as: Pristiq Stopped by: Howard Pouch, DO     TAKE these medications   ALPRAZolam 0.5 MG tablet Commonly known as: Xanax Take 1 tablet (0.5 mg total) by mouth 3 (three) times daily as needed for anxiety or sleep (panic).   cholecalciferol 1000 units tablet Commonly known as: VITAMIN D Take  1,000 Units by mouth every evening.     clobetasol 0.05 % external solution Commonly known as: TEMOVATE APPLY 1 APPLICATION TOPICALLY DAILY AS NEEDED FOR DERMATITIS   estradiol 0.075 MG/24HR Commonly known as: VIVELLE-DOT Place 1 patch onto the skin 2 (two) times a week. Wednesday and Sunday   FLUoxetine 40 MG capsule Commonly known as: PROZAC Take 1 capsule (40 mg total) by mouth daily after breakfast.   fluticasone 50 MCG/ACT nasal spray Commonly known as: FLONASE Place 2 sprays into the nose daily.   lamoTRIgine 200 MG tablet Commonly known as: LAMICTAL Take 1 tablet (200 mg total) by mouth at bedtime.   multivitamin with minerals Tabs tablet Take 1 tablet by mouth every evening.   Pazeo 0.7 % Soln Generic drug: Olopatadine HCl   sucralfate 1 GM/10ML suspension Commonly known as: Carafate TAKE 10 MLS BY MOUTH 3 TIMES DAILY AS NEEDED FOR DIGESTION       All past medical history, surgical history, allergies, family history, immunizations andmedications were updated in the EMR today and reviewed under the history and medication portions of their EMR.     Recent Results (from the past 2160 hour(s))  CBC w/Diff     Status: None   Collection Time: 04/27/19 10:10 AM  Result Value Ref Range   WBC 4.1 4.0 - 10.5 K/uL   RBC 4.63 3.87 - 5.11 Mil/uL   Hemoglobin 14.3 12.0 - 15.0 g/dL   HCT 43.8 36.0 - 46.0 %   MCV 94.8 78.0 - 100.0 fl   MCHC 32.6 30.0 - 36.0 g/dL   RDW 14.1 11.5 - 15.5 %   Platelets 204.0 150.0 - 400.0 K/uL   Neutrophils Relative % 61.1 43.0 - 77.0 %   Lymphocytes Relative 25.3 12.0 - 46.0 %   Monocytes Relative 8.7 3.0 - 12.0 %   Eosinophils Relative 4.1 0.0 - 5.0 %   Basophils Relative 0.8 0.0 - 3.0 %   Neutro Abs 2.5 1.4 - 7.7 K/uL   Lymphs Abs 1.0 0.7 - 4.0 K/uL   Monocytes Absolute 0.4 0.1 - 1.0 K/uL   Eosinophils Absolute 0.2 0.0 - 0.7 K/uL   Basophils Absolute 0.0 0.0 - 0.1 K/uL  TSH     Status: None   Collection Time: 04/27/19 10:10 AM  Result Value Ref Range   TSH 1.52 0.35 -  4.50 uIU/mL  Comp Met (CMET)     Status: None   Collection Time: 04/27/19 10:10 AM  Result Value Ref Range   Sodium 139 135 - 145 mEq/L   Potassium 5.0 3.5 - 5.1 mEq/L   Chloride 103 96 - 112 mEq/L   CO2 30 19 - 32 mEq/L   Glucose, Bld 86 70 - 99 mg/dL   BUN 12 6 - 23 mg/dL   Creatinine, Ser 0.89 0.40 - 1.20 mg/dL   Total Bilirubin 0.5 0.2 - 1.2 mg/dL   Alkaline Phosphatase 72 39 - 117 U/L   AST 21 0 - 37 U/L   ALT 14 0 - 35 U/L   Total Protein 6.4 6.0 - 8.3 g/dL   Albumin 4.4 3.5 - 5.2 g/dL   Calcium 9.4 8.4 - 10.5 mg/dL   GFR 64.12 >60.00 mL/min  Lipid panel     Status: Abnormal   Collection Time: 04/27/19 10:10 AM  Result Value Ref Range   Cholesterol 212 (H) 0 - 200 mg/dL    Comment: ATP III Classification       Desirable:  < 200 mg/dL  Borderline High:  200 - 239 mg/dL          High:  > = 240 mg/dL   Triglycerides 61.0 0.0 - 149.0 mg/dL    Comment: Normal:  <150 mg/dLBorderline High:  150 - 199 mg/dL   HDL 78.40 >39.00 mg/dL   VLDL 12.2 0.0 - 40.0 mg/dL   LDL Cholesterol 122 (H) 0 - 99 mg/dL   Total CHOL/HDL Ratio 3     Comment:                Men          Women1/2 Average Risk     3.4          3.3Average Risk          5.0          4.42X Average Risk          9.6          7.13X Average Risk          15.0          11.0                       NonHDL 133.99     Comment: NOTE:  Non-HDL goal should be 30 mg/dL higher than patient's LDL goal (i.e. LDL goal of < 70 mg/dL, would have non-HDL goal of < 100 mg/dL)  Hemoglobin A1c     Status: None   Collection Time: 04/27/19 10:10 AM  Result Value Ref Range   Hgb A1c MFr Bld 5.9 4.6 - 6.5 %    Comment: Glycemic Control Guidelines for People with Diabetes:Non Diabetic:  <6%Goal of Therapy: <7%Additional Action Suggested:  >8%   Urinalysis with Culture Reflex     Status: None   Collection Time: 04/27/19 10:24 AM   Specimen: Urine  Result Value Ref Range   Color, Urine YELLOW YELLOW   APPearance CLEAR CLEAR   Specific  Gravity, Urine 1.017 1.001 - 1.03   pH 8.0 5.0 - 8.0   Glucose, UA NEGATIVE NEGATIVE   Bilirubin Urine NEGATIVE NEGATIVE   Ketones, ur NEGATIVE NEGATIVE   Hgb urine dipstick NEGATIVE NEGATIVE   Protein, ur NEGATIVE NEGATIVE   Nitrites, Initial NEGATIVE NEGATIVE   Leukocyte Esterase NEGATIVE NEGATIVE   WBC, UA NONE SEEN 0 - 5 /HPF   RBC / HPF 0-2 0 - 2 /HPF   Squamous Epithelial / LPF NONE SEEN < OR = 5 /HPF   Bacteria, UA NONE SEEN NONE SEEN /HPF   Hyaline Cast NONE SEEN NONE SEEN /LPF  REFLEXIVE URINE CULTURE     Status: None   Collection Time: 04/27/19 10:24 AM  Result Value Ref Range   Reflexve Urine Culture      Comment: NO CULTURE INDICATED    Mm 3d Screen Breast Bilateral Result Date: 06/15/2018 CLINICAL DATA:  Screening. EXAM: DIGITAL SCREENING BILATERAL MAMMOGRAM WITH TOMO AND CAD COMPARISON:  Previous exam(s). ACR Breast Density Category c: The breast tissue is heterogeneously dense, which may obscure small masses. FINDINGS: There are no findings suspicious for malignancy. Images were processed with CAD. IMPRESSION: No mammographic evidence of malignancy. A result letter of this screening mammogram will be mailed directly to the patient. RECOMMENDATION: Screening mammogram in one year. (Code:SM-B-01Y) BI-RADS CATEGORY  1: Negative. Electronically Signed   By: Lajean Manes M.D.   On: 06/15/2018 15:17    ROS: 14 pt review of systems performed and negative (unless mentioned  in an HPI)  Objective: BP 133/84 (BP Location: Right Arm, Patient Position: Sitting, Cuff Size: Normal)    Pulse 74    Temp 98 F (36.7 C) (Temporal)    Resp 18    Ht 5' 3.75" (1.619 m)    Wt 122 lb 4 oz (55.5 kg)    SpO2 99%    BMI 21.15 kg/m  Gen: Afebrile. No acute distress. Nontoxic in appearance, well-developed, well-nourished, pleasant Caucasian female. HENT: AT. [place]. Bilateral TM visualized and normal in appearance, normal external auditory canal. MMM, no oral lesions, adequate dentition. Bilateral  nares within normal limits. Throat without erythema, ulcerations or exudates.  No cough on exam, no hoarseness on exam. Eyes:Pupils Equal Round Reactive to light, Extraocular movements intact,  Conjunctiva without redness, discharge or icterus. Neck/lymp/endocrine: Supple, no lymphadenopathy, no thyromegaly CV: RRR no murmur, no edema, +2/4 P posterior tibialis pulses.  No carotid bruits. No JVD. Chest: CTAB, no wheeze, rhonchi or crackles.  Normal respiratory effort.  Good air movement. Abd: Soft.  Flat. NTND. BS present.  No masses palpated. No hepatosplenomegaly. No rebound tenderness or guarding. Skin: No rashes, purpura or petechiae. Warm and well-perfused. Skin intact. Neuro/Msk:  Normal gait. PERLA. EOMi. Alert. Oriented x3.  Cranial nerves II through XII intact. Muscle strength 5/5 upper/lower extremity. DTRs equal bilaterally. Psych: Normal affect, dress and demeanor. Normal speech. Normal thought content and judgment.   No exam data present  Assessment/plan: [name] is a 63 y.o. female present for CPE  Anxiety and depression/PTSD (post-traumatic stress disorder)/Sleep disturbance -Managed by psychiatry/Dr. Arnoldo Morale -Referred to Dr. Mendelson/psychology for therapy per patient request prior appointment.    Hormone replacement therapy Patient is on hormone replacement therapy through her gynecologist.  Eczema, unspecified type Uses clobetasol solution as needed.  Refills provided today.  Urinary frequency/Urinary incontinence, unspecified type New problem.  Patient will be called with urine results and if infectious will treat appropriately by culture.  If noninfectious,  would encourage her to follow-up with her gynecologist for her incontinence. - Urinalysis with Culture Reflex  Encounter for long-term current use of medication - CBC w/Diff - Comp Met (CMET)  Screening cholesterol level - Lipid panel  Diabetes mellitus screening - Hemoglobin A1c  Need for  shingles vaccine Shingrix No. 1 provided today.  Nurse visit in 2-6 months for Shingrix No. 2 to complete series. - Varicella-zoster vaccine IM  Thyroid nodule -New problem to this provider.  Patient reports thyroid nodule present since 2015. - TSH collected today. -Last thyroid ultrasound 12/02/2013 with repeat ultrasound recommended.  No repeat was completed.  Will ensure stability with repeat ultrasound. - US THYROID; Future  Encounter for preventive health examination Patient was encouraged to exercise greater than 150 minutes a week. Patient was encouraged to choose a diet filled with fresh fruits and vegetables, and lean meats. AVS provided to patient today for education/recommendation on gender specific health and safety maintenance. Colonoscopy: completed 2014, by [name]. follow up 10 years. Mammogram: completed: Patient has scheduled for 06/20/2019.  Cervical cancer screening: last pap: Patient reports her last Pap was in 2018, requesting records. Immunizations: tdap 2015, Influenza 2020 UTD, shingrix series started today.  Infectious disease screening: HIV completed  DEXA: GYN completed. 2020, per patient.   Return in about 1 year (around 04/26/2020) for CPE (30 min).  Follow-up in 2-6 months for Shingrix No. 2  Orders Placed This Encounter  Procedures   US THYROID   Varicella-zoster vaccine IM   CBC w/Diff  TSH   Comp Met (CMET)   Lipid panel   Hemoglobin A1c   Urinalysis with Culture Reflex   REFLEXIVE URINE CULTURE   Annual physical completed today, in addition to > 15 minutes spent with patient, > 50% of that time face to face concerning new acute problem and chronic condition (new to this provider).   Electronically signed by: Howard Pouch, DO [place]

## 2019-04-28 ENCOUNTER — Telehealth: Payer: Self-pay | Admitting: Family Medicine

## 2019-04-28 LAB — URINALYSIS W MICROSCOPIC + REFLEX CULTURE
Bacteria, UA: NONE SEEN /HPF
Bilirubin Urine: NEGATIVE
Glucose, UA: NEGATIVE
Hgb urine dipstick: NEGATIVE
Hyaline Cast: NONE SEEN /LPF
Ketones, ur: NEGATIVE
Leukocyte Esterase: NEGATIVE
Nitrites, Initial: NEGATIVE
Protein, ur: NEGATIVE
Specific Gravity, Urine: 1.017 (ref 1.001–1.03)
Squamous Epithelial / HPF: NONE SEEN /HPF (ref <=5)
WBC, UA: NONE SEEN /HPF (ref 0–5)
pH: 8 (ref 5.0–8.0)

## 2019-04-28 LAB — NO CULTURE INDICATED

## 2019-04-28 NOTE — Telephone Encounter (Signed)
Please inform patient the following information: Her labs are normal with the following exceptions: -Her LDL/bad cholesterol is little higher than it was last year from 105 up to 122 this year.  Still very well controlled.  Her total cholesterol also appears higher than normal, however this is secondary to her HDL/good cholesterol being so high which means it is above normal for good reasons and this is not a concern. -Her blood counts are normal.  Her thyroid is functioning normal. -Her A1c/diabetes screen is a little higher from last year at 5.6 up to 5.9 this year.  Her fasting glucose is normal, so this still puts her in the normal range. -Her urine studies were all completely normal.  No signs of infection. - I did place the order for thyroid US>> they will call to schedule her.

## 2019-04-28 NOTE — Telephone Encounter (Signed)
Pt was called and given information, she verbalized understanding  

## 2019-04-29 MED FILL — SUBVENITE 200 MG TABS: 200 | 90 days supply | Qty: 90 | Fill #0

## 2019-05-06 ENCOUNTER — Other Ambulatory Visit: Payer: 59

## 2019-05-13 ENCOUNTER — Ambulatory Visit
Admission: RE | Admit: 2019-05-13 | Discharge: 2019-05-13 | Disposition: A | Discharge status: Home / Self Care | Payer: 59 | Source: Ambulatory Visit | Attending: Family Medicine | Admitting: Family Medicine

## 2019-05-13 DIAGNOSIS — E041 Nontoxic single thyroid nodule: Secondary | ICD-10-CM

## 2019-05-18 ENCOUNTER — Ambulatory Visit: Payer: 59 | Admitting: Psychiatry

## 2019-05-18 DIAGNOSIS — M5136 Other intervertebral disc degeneration, lumbar region: Secondary | ICD-10-CM | POA: Diagnosis not present

## 2019-05-18 DIAGNOSIS — S76312A Strain of muscle, fascia and tendon of the posterior muscle group at thigh level, left thigh, initial encounter: Secondary | ICD-10-CM | POA: Insufficient documentation

## 2019-05-18 HISTORY — DX: Strain of muscle, fascia and tendon of the posterior muscle group at thigh level, left thigh, initial encounter: S76.312A

## 2019-06-01 DIAGNOSIS — H10503 Unspecified blepharoconjunctivitis, bilateral: Secondary | ICD-10-CM | POA: Diagnosis not present

## 2019-06-01 DIAGNOSIS — H0102A Squamous blepharitis right eye, upper and lower eyelids: Secondary | ICD-10-CM | POA: Diagnosis not present

## 2019-06-01 DIAGNOSIS — H0102B Squamous blepharitis left eye, upper and lower eyelids: Secondary | ICD-10-CM | POA: Diagnosis not present

## 2019-06-01 DIAGNOSIS — H2513 Age-related nuclear cataract, bilateral: Secondary | ICD-10-CM | POA: Diagnosis not present

## 2019-06-01 DIAGNOSIS — H16223 Keratoconjunctivitis sicca, not specified as Sjogren's, bilateral: Secondary | ICD-10-CM | POA: Diagnosis not present

## 2019-06-01 MED FILL — PAZEO 0.7% EYE DROPS: 0.7 | 30 days supply | Qty: 3 | Fill #0

## 2019-06-02 ENCOUNTER — Ambulatory Visit: Payer: 59 | Admitting: Psychiatry

## 2019-06-15 ENCOUNTER — Other Ambulatory Visit: Payer: Self-pay

## 2019-06-15 ENCOUNTER — Ambulatory Visit: Payer: 59 | Admitting: Podiatry

## 2019-06-15 ENCOUNTER — Encounter: Payer: Self-pay | Admitting: Psychiatry

## 2019-06-15 ENCOUNTER — Ambulatory Visit (INDEPENDENT_AMBULATORY_CARE_PROVIDER_SITE_OTHER): Payer: 59 | Admitting: Psychiatry

## 2019-06-15 VITALS — Ht 62.0 in | Wt 122.0 lb

## 2019-06-15 DIAGNOSIS — F330 Major depressive disorder, recurrent, mild: Secondary | ICD-10-CM

## 2019-06-15 DIAGNOSIS — F410 Panic disorder [episodic paroxysmal anxiety] without agoraphobia: Secondary | ICD-10-CM

## 2019-06-15 DIAGNOSIS — F4310 Post-traumatic stress disorder, unspecified: Secondary | ICD-10-CM

## 2019-06-15 MED ORDER — LAMOTRIGINE 200 MG PO TABS: 200.0000 mg | ORAL_TABLET | Freq: Every day | ORAL | 1 refills | Status: DC

## 2019-06-15 MED ORDER — ALPRAZOLAM 0.5 MG PO TABS: 0.5000 mg | ORAL_TABLET | Freq: Three times a day (TID) | ORAL | 2 refills | Status: DC | PRN

## 2019-06-15 MED ORDER — FLUOXETINE HCL 40 MG PO CAPS: 40.0000 mg | ORAL_CAPSULE | Freq: Every day | ORAL | 1 refills | Status: DC

## 2019-06-15 MED FILL — FLUoxetine HCL 40 MG CAPS: 40 | 90 days supply | Qty: 90 | Fill #0

## 2019-06-15 MED FILL — ALPRAZolam 0.5 MG TABS: 0.5 | 30 days supply | Qty: 90 | Fill #0

## 2019-06-15 NOTE — Progress Notes (Signed)
[facility]  Patient ID: [name],  MRN: [id number]  PCP: Ma Hillock, DO  Date of Evaluation: 06/15/2019 Time spent:20 minutes from 1100 to 1120  Chief Complaint:  Chief Complaint    Depression; Anxiety; Panic Attack      HISTORY/CURRENT STATUS: [name] is seen onsite in office 20 minutes face-to-face individually with consent with epic collateral for psychiatric interview and exam in 3.62-month evaluation and management of combined anxiety and depression.  At appointment before last 5 months ago, Anafranil was changed to Pristiq for low dose augmentation of Prozac with improved efficacy for anxiety symptoms, however she has stopped Pristiq in the interim for side effect of headache.  She is now content and capable on Lamictal, Prozac, and as needed Xanax.  She has regained the 2 pounds she lost therefore very stable in weight.  She continues her biking which significantly helps her mood and energy.  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  She currently has stability with medications and therapy but requests to continue 53-month appointments with her stressors and history of exacerbations and mood and anxiety.  She has no mania, suicidality, psychosis, intoxication, or delirium.   Depression The patient presents withrecurrent depression with most recent episode starting more than 5 months ago. The onset quality is sudden. The problem occurs intermittently.The problem has beenrapidly improvingsince recurrence treated now back to maintenance medication.Associated symptoms include guilt, doubt, decreased concentration,andsad. Associated symptoms include not irritable,no fatigue,no helplessness,no insomnia,no suicidality, no hopelessness,no restlessness,no decreased interestand no headaches.The symptoms are aggravated by work stress, social issues, family issues and medication.Past  treatments include SSRIs - Selective serotonin reuptake inhibitors, TCAs - Tricyclic antidepressants, other medications and psychotherapy.Compliance with treatment is variable.Past compliance problems include medical issues, difficulty with treatment plan and medication issues.Previous treatment provided moderaterelief.Risk factors include a change in medication usage/dosage, emotional abuse, family history of mental illness, family history, family violence, history of mental illness, major life event, prior traumatic experience and stress. Past medical history includes chronic illness,physical disability,anxiety,depressionand mental health disorder. Pertinent negatives include no life-threatening condition,no recent psychiatric admission,no bipolar disorder,no eating disorder,no obsessive-compulsive disorder,no post-traumatic stress disorder,no schizophrenia,no suicide attemptsand no head trauma.  Individual Medical History/ Review of Systems: Changes? :Yes Urinary frequency and back pain have resolved with primary care  Allergies: Aspartame and phenylalanine, Beef-derived products, Citrus, Penicillins, Promethazine hcl, Adhesive [tape], Doxycycline, and Morphine sulfate  Current Medications:  Current Outpatient Medications:  .  ALPRAZolam (XANAX) 0.5 MG tablet, Take 1 tablet (0.5 mg total) by mouth 3 (three) times daily as needed for anxiety or sleep (panic)., Disp: 90 tablet, Rfl: 2 .  cholecalciferol (VITAMIN D) 1000 UNITS tablet, Take 1,000 Units by mouth every evening., Disp: , Rfl:  .  clobetasol (TEMOVATE) 0.05 % external solution, APPLY 1 APPLICATION TOPICALLY DAILY AS NEEDED FOR DERMATITIS, Disp: 50 mL, Rfl: 2 .  estradiol (VIVELLE-DOT) 0.075 MG/24HR, Place 1 patch onto the skin 2 (two) times a week. Wednesday and Sunday, Disp: , Rfl:  .  FLUoxetine (PROZAC) 40 MG capsule, Take 1 capsule (40 mg total) by mouth daily after breakfast., Disp: 90 capsule, Rfl: 1 .   fluticasone (FLONASE) 50 MCG/ACT nasal spray, Place 2 sprays into the nose daily., Disp: 16 g, Rfl: 5 .  lamoTRIgine (LAMICTAL) 200 MG tablet, Take 1 tablet (200 mg total) by mouth at bedtime., Disp: 90 tablet, Rfl: 1 .  Multiple Vitamin (MULTIVITAMIN WITH MINERALS) TABS, Take 1  tablet by mouth every evening., Disp: , Rfl:  .  PAZEO 0.7 % SOLN, , Disp: , Rfl: 98 .  sucralfate (CARAFATE) 1 GM/10ML suspension, TAKE 10 MLS BY MOUTH 3 TIMES DAILY AS NEEDED FOR DIGESTION, Disp: 840 mL, Rfl: 2   Medication Side Effects: headache resolving off Pristiq  Family Medical/ Social History: Changes? Yes husband's radiation therapy for prostate cancer been exhausting and he is irritable and angry with his moody fatigue.  MENTAL HEALTH EXAM:  Height 5\' 2"  (1.575 m), weight 122 lb (55.3 kg).Body mass index is 22.31 kg/m. Muscle strengths and tone 5/5, postural reflexes and gait 0/0, and AIMS = 0 otherwise deferred for coronavirus shutdown  General Appearance: Casual, Fairly Groomed, Guarded and Meticulous  Eye Contact:  Good  Speech:  Clear and Coherent, Normal Rate and Talkative  Volume:  Normal  Mood:  Anxious, Depressed, Dysphoric, Euthymic and Worthless  Affect:  Congruent, Depressed, Inappropriate, Full Range and Anxious  Thought Process:  Coherent, Goal Directed, Irrelevant and Descriptions of Associations: Tangential  Orientation:  Full (Time, Place, and Person)  Thought Content: Tangential and rumination  Suicidal Thoughts:  No  Homicidal Thoughts:  No  Memory:  Immediate;   Good Remote;   Good  Judgement:  Good  Insight:  Fair  Psychomotor Activity:  Normal, Decreased and Mannerisms  Concentration:  Concentration: Fair and Attention Span: Good  Recall:  Good  Fund of Knowledge: Good  Language: Good  Assets:  Desire for Improvement Leisure Time Resilience Vocational/Educational  ADL's:  Intact  Cognition: WNL  Prognosis:  Good    DIAGNOSES:    ICD-10-CM   1. Mild recurrent major  depression (HCC)  F33.0 FLUoxetine (PROZAC) 40 MG capsule    lamoTRIgine (LAMICTAL) 200 MG tablet  2. PTSD (post-traumatic stress disorder)  F43.10 FLUoxetine (PROZAC) 40 MG capsule    lamoTRIgine (LAMICTAL) 200 MG tablet    ALPRAZolam (XANAX) 0.5 MG tablet  3. Panic disorder  F41.0 FLUoxetine (PROZAC) 40 MG capsule    ALPRAZolam (XANAX) 0.5 MG tablet    Receiving Psychotherapy: Yes  With Shanon Ace, LCSW appointments in August and September in the interim   RECOMMENDATIONS: Psychoeducation focuses on facilitating husband's coping with illness when patient herself has significant illness anxiety for exposure, desensitization, habit reversal, thought stopping, response prevention.  He continues Prozac 40 mg daily after breakfast #90 with 1 refill sent to [facility] for panic and posttraumatic anxiety and depression.  He is E scribed Lamictal 200 mg every bedtime as a 90-day supply and 1 refill to [facility] for depression and PTSD.  She is E scribed Xanax 0.5 mg 3 times daily as needed for panic anxiety sent as #90 with 2 refills to [facility] for PTSD and panic disorder.  She returns in 3 months for follow-up.   Delight Hoh, MD

## 2019-06-16 MED FILL — FLUOROMETHOLONE 0.1% DROPS: 0.1 | 16 days supply | Qty: 5 | Fill #0

## 2019-06-20 ENCOUNTER — Other Ambulatory Visit: Payer: Self-pay

## 2019-06-20 ENCOUNTER — Ambulatory Visit
Admission: RE | Admit: 2019-06-20 | Discharge: 2019-06-20 | Disposition: A | Discharge status: Home / Self Care | Payer: 59 | Source: Ambulatory Visit | Attending: Obstetrics & Gynecology | Admitting: Obstetrics & Gynecology

## 2019-06-20 DIAGNOSIS — Z1231 Encounter for screening mammogram for malignant neoplasm of breast: Secondary | ICD-10-CM | POA: Diagnosis not present

## 2019-06-20 DIAGNOSIS — M5136 Other intervertebral disc degeneration, lumbar region: Secondary | ICD-10-CM | POA: Diagnosis not present

## 2019-06-21 ENCOUNTER — Other Ambulatory Visit: Payer: Self-pay | Admitting: Obstetrics & Gynecology

## 2019-06-21 DIAGNOSIS — R928 Other abnormal and inconclusive findings on diagnostic imaging of breast: Secondary | ICD-10-CM

## 2019-06-24 ENCOUNTER — Ambulatory Visit
Admission: RE | Admit: 2019-06-24 | Discharge: 2019-06-24 | Disposition: A | Discharge status: Home / Self Care | Payer: 59 | Source: Ambulatory Visit | Attending: Obstetrics & Gynecology | Admitting: Obstetrics & Gynecology

## 2019-06-24 ENCOUNTER — Other Ambulatory Visit: Payer: Self-pay

## 2019-06-24 ENCOUNTER — Ambulatory Visit: Payer: 59

## 2019-06-24 DIAGNOSIS — R928 Other abnormal and inconclusive findings on diagnostic imaging of breast: Secondary | ICD-10-CM

## 2019-06-24 DIAGNOSIS — R922 Inconclusive mammogram: Secondary | ICD-10-CM | POA: Diagnosis not present

## 2019-06-27 MED FILL — PAZEO 0.7% EYE DROPS: 0.7 | 30 days supply | Qty: 3 | Fill #0

## 2019-06-27 MED FILL — FLUOROMETHOLONE 0.1% DROPS: 0.1 | 16 days supply | Qty: 5 | Fill #0

## 2019-06-29 ENCOUNTER — Other Ambulatory Visit: Payer: Self-pay | Admitting: Podiatry

## 2019-06-29 ENCOUNTER — Other Ambulatory Visit: Payer: Self-pay

## 2019-06-29 ENCOUNTER — Ambulatory Visit: Payer: 59 | Admitting: Podiatry

## 2019-06-29 ENCOUNTER — Ambulatory Visit (INDEPENDENT_AMBULATORY_CARE_PROVIDER_SITE_OTHER): Payer: 59

## 2019-06-29 DIAGNOSIS — M205X1 Other deformities of toe(s) (acquired), right foot: Secondary | ICD-10-CM

## 2019-06-29 DIAGNOSIS — M79671 Pain in right foot: Secondary | ICD-10-CM

## 2019-06-29 MED ORDER — MELOXICAM 15 MG PO TABS: 15.0000 mg | ORAL_TABLET | Freq: Every day | ORAL | 1 refills | Status: DC

## 2019-06-29 MED FILL — MELOXICAM 15 MG TABLET: 15 | 30 days supply | Qty: 30 | Fill #0

## 2019-07-04 NOTE — Progress Notes (Signed)
   HPI: 63 y.o. female presenting today with a chief complaint of pain to the right great toe and dorsal right foot that began about 6 months ago. Wearing shoes increases the pain. She has been using orthotics with no significant relief. She denies any known trauma or injury. Patient is here for further evaluation and treatment.   Past Medical History:  Diagnosis Date  . Allergy   . Anemia    after birth of child 19yrs ago  . Anxiety and depression    with insomnia  . Blood transfusion without reported diagnosis    x3  . Chronic back pain    ? RA, ? Lupus - reports saw rheumatologist in the past but better now, sees Dr. Trenton Gammon  . Eczema   . GERD (gastroesophageal reflux disease)    takes Omeprazole daily, with nausea  . Heart murmur   . Hemorrhoids   . History of kidney stones    passed on her own  . History of migraine    last one a yr ago and takes Zomig prn  . Hot flashes 11/10/2014  . HTN (hypertension) 03/31/2012  . Hx of echocardiogram    Echo (8/15):  EF 55-60%, no RWMA  . IBS (irritable bowel syndrome)    constipation predominant  . Interstitial cystitis    hx of UTI  . Numbness    both legs and related to back  . Osteoporosis    osteopenia  . Palpitations    on metoprolol in past but made BP too low  . Pneumonia    hx of;last time 107yrs ago  . PONV (postoperative nausea and vomiting)    laryngospasm-1999  . Poor vision 10/15/2015   -? Ocular rosacea -sees opthomology   . Raynaud's disease /phenomenon 10/21/2012   no meds, worse in the winter or cold environment.   . Rheumatoid aortitis    uses Diclofenac gel;Rheumatoid  . Seasonal allergies    Flonase daily   . Urinary incontinence      Physical Exam: General: The patient is alert and oriented x3 in no acute distress.  Dermatology: Skin is warm, dry and supple bilateral lower extremities. Negative for open lesions or macerations.  Vascular: Palpable pedal pulses bilaterally. No edema or erythema noted.  Capillary refill within normal limits.  Neurological: Epicritic and protective threshold grossly intact bilaterally.   Musculoskeletal Exam: Pain on palpation with limited range of motion noted to the first MPJ right foot.  Radiographic Exam: Degenerative changes noted with joint space narrowing first MPJ. There also appears to be extra-articular spurring noted about the joint.     Assessment: 1. Hallux limitus right    Plan of Care:  1. Patient evaluated. X-Rays reviewed.  2. Declined injection today.  3. Prescription for Meloxicam 15 mg provided to patient. 4. Post op shoe dispensed.  5. Return to clinic as needed.   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       Edrick Kins, DPM [facility]  Dr. Edrick Kins, DPM    [address], [place] [address]                Office [phone]  Fax [phone]

## 2019-07-12 MED FILL — FLUOROMETHOLONE 0.1% DROPS: 0.1 | 30 days supply | Qty: 5 | Fill #0

## 2019-07-26 MED FILL — SUBVENITE 200 MG TABS: 200 | 90 days supply | Qty: 90 | Fill #0

## 2019-08-03 DIAGNOSIS — Z981 Arthrodesis status: Secondary | ICD-10-CM | POA: Insufficient documentation

## 2019-08-03 DIAGNOSIS — R030 Elevated blood-pressure reading, without diagnosis of hypertension: Secondary | ICD-10-CM | POA: Diagnosis not present

## 2019-08-03 DIAGNOSIS — M47816 Spondylosis without myelopathy or radiculopathy, lumbar region: Secondary | ICD-10-CM | POA: Insufficient documentation

## 2019-08-03 MED FILL — CYCLOBENZAPRINE HCL 10 MG T: 10 | 10 days supply | Qty: 30 | Fill #0

## 2019-08-10 DIAGNOSIS — M545 Low back pain: Secondary | ICD-10-CM | POA: Diagnosis not present

## 2019-08-10 DIAGNOSIS — M47816 Spondylosis without myelopathy or radiculopathy, lumbar region: Secondary | ICD-10-CM | POA: Diagnosis not present

## 2019-08-16 DIAGNOSIS — Z981 Arthrodesis status: Secondary | ICD-10-CM | POA: Diagnosis not present

## 2019-08-16 DIAGNOSIS — M4310 Spondylolisthesis, site unspecified: Secondary | ICD-10-CM | POA: Diagnosis not present

## 2019-08-16 DIAGNOSIS — M47816 Spondylosis without myelopathy or radiculopathy, lumbar region: Secondary | ICD-10-CM | POA: Diagnosis not present

## 2019-08-16 DIAGNOSIS — R030 Elevated blood-pressure reading, without diagnosis of hypertension: Secondary | ICD-10-CM | POA: Diagnosis not present

## 2019-08-31 DIAGNOSIS — Z01419 Encounter for gynecological examination (general) (routine) without abnormal findings: Secondary | ICD-10-CM | POA: Diagnosis not present

## 2019-08-31 DIAGNOSIS — Z6822 Body mass index (BMI) 22.0-22.9, adult: Secondary | ICD-10-CM | POA: Diagnosis not present

## 2019-08-31 DIAGNOSIS — Z79890 Hormone replacement therapy (postmenopausal): Secondary | ICD-10-CM | POA: Diagnosis not present

## 2019-08-31 MED FILL — ESTRADIOL 0.1 MG/24HR PTTW: 0.1 | 84 days supply | Qty: 24 | Fill #0

## 2019-09-08 DIAGNOSIS — M47816 Spondylosis without myelopathy or radiculopathy, lumbar region: Secondary | ICD-10-CM | POA: Diagnosis not present

## 2019-09-13 ENCOUNTER — Encounter: Payer: Self-pay | Admitting: Psychiatry

## 2019-09-13 ENCOUNTER — Ambulatory Visit (INDEPENDENT_AMBULATORY_CARE_PROVIDER_SITE_OTHER): Payer: 59 | Admitting: Psychiatry

## 2019-09-13 ENCOUNTER — Other Ambulatory Visit: Payer: Self-pay

## 2019-09-13 VITALS — Ht 62.0 in | Wt 119.0 lb

## 2019-09-13 DIAGNOSIS — F4310 Post-traumatic stress disorder, unspecified: Secondary | ICD-10-CM | POA: Diagnosis not present

## 2019-09-13 DIAGNOSIS — F330 Major depressive disorder, recurrent, mild: Secondary | ICD-10-CM

## 2019-09-13 DIAGNOSIS — F410 Panic disorder [episodic paroxysmal anxiety] without agoraphobia: Secondary | ICD-10-CM

## 2019-09-13 NOTE — Progress Notes (Signed)
[facility]  Patient ID: [name],  MRN: [id number]  PCP: Ma Hillock, DO  Date of Evaluation: 09/13/2019 Time spent:25 minutes from 1055 to 1120  Chief Complaint:  Chief Complaint    Depression; Anxiety; Trauma      HISTORY/CURRENT STATUS: [name] is seen onsite in office 25 minutes face-to-face individually with consent with epic collateral for psychiatric interview and exam in 22-month evaluation and management of progressive acute exacerbation of anxiety for realistic stressors having few options for resolution seeking more therapy again.  She is dissatisfied with the thought of more medication taking Xanax mostly at night and her Prozac and Lamictal otherwise, having confidence the Lamictal helps but not that the Prozac or similar medication helps that much.  However, she had stopped augmentation with Anafranil and with Pristiq as being unhelpful last year so that she is content with the current medications emphasizing that the medicine, the medical model, and brief visits are not what she needs but rather she is needing regular therapy again.  She states her mom does not get it being in her early 35s always pampered in life.  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  She also worries about her job as they have new management without addressing existing problems so that consequences continue to mount both for work being done and lack of seniority for problem solving on the site.  She has less time for biking, though she and husband did have a maiden voyage in their camper to [place] for 1 camping trip.  There is an industrial park being built next to her neighborhood so that she feels the need to move but the lack of certainty  of income and the expectation that property will be significantly devalued as the work has already begun.  She states they have tried in the community to reconcile solutions without success.  She last saw therapist Shanon Ace 03/23/2019 due to return in 2 weeks but has not been back since though she states now she is asking for an appointment and there are none to be available with Neoma Laming or any replacement and she doubts the [facility] will be possible for her to talk about the realities of the workplace such as with Lucianne Lei, LCSW there.  She has no mania, psychosis, suicidality or delirium.  Depression The patient hasrecurrent depression with most recent episodestartingmore than 8 months ago. The onset quality is sudden. The problem occurs intermittently.The problem has  improvedsince recurrence now back to maintenance except there is despair associated with her increased anxiety over realistic problems.Associated symptoms includehelplessness, doubt,inhibition, somatoform tension, restlessness, decreased concentration,andnervous anxious behavior. Associated symptoms include no  irritability,no fatigue,no insomnia,no suicidality, no hopelessness,no decreased interestand no headaches.The symptoms are aggravated by work stress, social issues, family issues and medication.Past treatments include SSRIs - Selective serotonin reuptake inhibitors, TCAs - Tricyclic antidepressants, other medications and psychotherapy.Compliance with treatment is variable.Past compliance problems include medical issues, difficulty with treatment plan and medication issues.Previous treatment provided moderaterelief.Risk factors include a change in medication usage/dosage, emotional abuse, family history of mental illness, family history, family violence, history of mental illness, major life event, prior traumatic experience and stress. Past medical history includes chronic illness,physical  disability,anxiety,depressionand mental health disorder. Pertinent negatives include no life-threatening condition,no recent psychiatric admission,no bipolar disorder,no eating disorder,no obsessive-compulsive disorder,no post-traumatic stress disorder,no schizophrenia,no suicide attemptsand no head trauma.  Individual Medical History/ Review of Systems: Changes? :Yes Weight is down 3 pounds in 3 months.  Allergies: Aspartame and phenylalanine, Beef-derived products, Citrus, Penicillins, Promethazine hcl, Adhesive [tape], Doxycycline, and Morphine sulfate  Current Medications:  Current Outpatient Medications:  .  ALPRAZolam (XANAX) 0.5 MG tablet, Take 1 tablet (0.5 mg total) by mouth 3 (three) times daily as needed for anxiety or sleep (panic)., Disp: 90 tablet, Rfl: 2 .  cholecalciferol (VITAMIN D) 1000 UNITS tablet, Take 1,000 Units by mouth every evening., Disp: , Rfl:  .  clobetasol (TEMOVATE) 0.05 % external solution, APPLY 1 APPLICATION TOPICALLY DAILY AS NEEDED FOR DERMATITIS (Patient not taking: Reported on 06/29/2019), Disp: 50 mL, Rfl: 2 .  estradiol (VIVELLE-DOT) 0.075 MG/24HR, Place 1 patch onto the skin 2 (two) times a week. Wednesday and Sunday, Disp: , Rfl:  .  estradiol (VIVELLE-DOT) 0.1 MG/24HR patch, , Disp: , Rfl:  .  fluorometholone (FML) 0.1 % ophthalmic suspension, 1 drop 3 (three) times daily., Disp: , Rfl:  .  FLUoxetine (PROZAC) 40 MG capsule, Take 1 capsule (40 mg total) by mouth daily after breakfast., Disp: 90 capsule, Rfl: 1 .  fluticasone (FLONASE) 50 MCG/ACT nasal spray, Place 2 sprays into the nose daily. (Patient not taking: Reported on 06/29/2019), Disp: 16 g, Rfl: 5 .  lamoTRIgine (LAMICTAL) 200 MG tablet, Take 1 tablet (200 mg total) by mouth at bedtime., Disp: 90 tablet, Rfl: 1 .  meloxicam (MOBIC) 15 MG tablet, Take 1 tablet (15 mg total) by mouth daily., Disp: 30 tablet, Rfl: 1 .  Multiple Vitamin (MULTIVITAMIN WITH MINERALS) TABS, Take 1 tablet  by mouth every evening., Disp: , Rfl:  .  Olopatadine HCl 0.2 % SOLN, , Disp: , Rfl:  .  PAZEO 0.7 % SOLN, , Disp: , Rfl: 98 .  sucralfate (CARAFATE) 1 GM/10ML suspension, TAKE 10 MLS BY MOUTH 3 TIMES DAILY AS NEEDED FOR DIGESTION (Patient not taking: Reported on 06/29/2019), Disp: 840 mL, Rfl: 2 .  XANAX XR 0.5 MG 24 hr tablet, , Disp: , Rfl:   Medication Side Effects: none  Family Medical/ Social History: Changes?  Husband's prostate cancer, management change on the job, and industrial park being built next to her neighborhood are all major stressors.  MENTAL HEALTH EXAM:  Height 5\' 2"  (1.575 m), weight 119 lb (54 kg).Body mass index is 21.77 kg/m. Muscle strengths and tone 5/5, postural reflexes and gait 0/0, and AIMS = 0 otherwise deferred for coronavirus shutdown  General Appearance: Casual, Fairly Groomed, Guarded and Meticulous  Eye Contact:  Fair  Speech:  Clear and Coherent, Normal Rate and Talkative  Volume:  Normal  Mood:  Angry, Anxious, Dysphoric and Euthymic  Affect:  Congruent, Inappropriate, Full Range and Anxious  Thought Process:  Coherent, Goal Directed, Irrelevant and Descriptions of Associations: Circumstantial  Orientation:  Full (Time, Place, and Person)  Thought Content: Obsessions and Rumination   Suicidal Thoughts:  No  Homicidal Thoughts:  No  Memory:  Immediate;   Good Remote;   Good  Judgement:  Fair  Insight:  Fair  Psychomotor Activity:  Normal, Increased, Mannerisms and Restlessness  Concentration:  Concentration: Fair and Attention Span: Good  Recall:  Good  Fund of Knowledge: Good  Language: Good  Assets:  Resilience Talents/Skills Vocational/Educational  ADL's:  Intact  Cognition: WNL  Eulas Post  Sonia Baller, MD   Prognosis:  Good    DIAGNOSES:    ICD-10-CM   1. Mild recurrent major depression ([place])  F33.0   2. PTSD (post-traumatic stress disorder)  F43.10   3. Panic disorder  F41.0     Receiving Psychotherapy: Yes    RECOMMENDATIONS:  Psychosupportive psychoeducation revworks all of her acute stressors with legacy of past coping and current obstacles toward finding relief.  She inquires about help for restarting therapy.  Another appointment with Shanon Ace, LCSW is her preference if possible though there is a new therapist in the office and there is the [facility] possibly best to see Lucianne Lei, LCSW if necessary.  She declines any change in medication currently.  He has 90-day refills on Prozac 40 mg every morning after breakfast for anxiety and depression and Lamictal 200 mg every bedtime for depression.  She has Xanax 0.5 mg up to 3 times daily having only filled 1 of 3 dispensings possible from 06/15/2019 according to [facility] registry for panic or posttraumatic anxiety.  She would benefit biking more often and hopefully camping, though there are few opportunities with all of the current problems though she is willing to leave work for a therapy appointment if necessary for restarting therapy.  She declines to consider other medication interventions today but does agree to return in 2 months for follow-up or sooner if needed.

## 2019-09-15 MED FILL — ESTRADIOL 0.1 MG/24HR PTTW: 0.1 | 84 days supply | Qty: 24 | Fill #0

## 2019-09-15 MED FILL — ALPRAZolam 0.5 MG TABS: 0.5 | 30 days supply | Qty: 90 | Fill #1

## 2019-09-22 ENCOUNTER — Other Ambulatory Visit: Payer: Self-pay

## 2019-09-22 ENCOUNTER — Ambulatory Visit (INDEPENDENT_AMBULATORY_CARE_PROVIDER_SITE_OTHER): Payer: 59 | Admitting: Psychiatry

## 2019-09-22 DIAGNOSIS — F330 Major depressive disorder, recurrent, mild: Secondary | ICD-10-CM | POA: Diagnosis not present

## 2019-09-22 NOTE — Progress Notes (Signed)
[facility] Counselor/Therapist Progress Note  Patient ID: [name], MRN: [id number],    Date: 09/22/2019  Time Spent: 60 minutes   11:00am to 12:00noon  Treatment Type: Individual Therapy  Reported Symptoms:  Lots of anxiety, depression, "I'm falling apart at the seams", "I have nobody to talk to, my husband has cancer and he wont' talk to me either, emotionally isolated s  Mental Status Exam:  Appearance:   Casual     Behavior:  Appropriate and Sharing  Motor:  Normal  Speech/Language:   Normal Rate  Affect:  anxious, depressed  Mood:  anxious, depressed and sad  Thought process:  goal directed  Thought content:    WNL  Sensory/Perceptual disturbances:    WNL  Orientation:  oriented to person, place, time/date, situation, day of week, month of year and year  Attention:  Good  Concentration:  Good  Memory:  WNL  Fund of knowledge:   Good  Insight:    Good  Judgment:   Good  Impulse Control:  Good   Risk Assessment: Danger to Self:  No Self-injurious Behavior: No Danger to Others: No Duty to Warn:no Physical Aggression / Violence:No  Access to Firearms a concern: No  Gang Involvement:No   Subjective: Patient reports things have worsened over time and are very difficult right now and "I'd like to crawl under a rock."  Difficulty working but that's also where she has "more social contact." Anxious, depressed, few friends. 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  Interventions: Cognitive Behavioral Therapy and Solution-Oriented/Positive Psychology  Diagnosis:   ICD-10-CM   1. Mild recurrent major depression ([place])  F33.0      Plan:  Patient not signing the tx plan on computer screen due to COVID   Treatment Goals: Goals remain on treatment plan as patient works on strategies to meet her goals.  Progress will be noted each session and documented in "Progress" section of treatment plan.  Long term  goal: Develop healthy cognitive patterns and beliefs about self and the world that lead to alleviation of depression and anxiety and help prevent relapse.  Progressing:  Patient is motivated. Struggling to focus some due to immediate, unexpected health concern with husband.  Short term goal: Learn and implement personal skills for managing stress, solving daily problems, and resolving conflicts effectively.  Strategy: Teach patient calming skills, problem solving skills, and conflict resolution skills, to better manage daily stressors.  Progressing: Patient returns to therapy today after not being seen for several months. Husband has cancer and not really speaking much to wiife. She stated earlier that she has nobody to talk with but she later explains that she talks to a few people and sometimes pushed people away. I have more coping trouble when I overwhelmed and I am overwhelmed now with work, husband's illness, my mother' unrealistic expectations, family situations. States that while she speaks with other people, it's not a real close relationship.   Priorizing her concerns today:  Husband [name], work, mother Very unhappy, stressed and I need to do things that are of interest to me and help me.  She discussed some of those things as being riding her bike, talking with a few other people, and being outside.  In addition to that, she is encouraged to offer conversation with husband without "pushing" it, and to offer choices on things and if he says it doesn't matter, then try respecting that  without getting upset over it.  To work on focusing on what's in her control rather than what isn't, and also staying in the present versus in the past or jumping ahead into the future. Denies any SI.  Looked at her anxious thoughts and how they are getting in her way.  She is to self-montor thoughts between appts and we'll pick up with this next session.  She had other things that were more of a priority for  her today which was understandable. Did review calming skills that we had talked about in prior treatment and she found helpful.  Goal review and progress/challenges noted with patient.  Next appt within 2 weeks.   Shanon Ace, LCSW

## 2019-09-26 ENCOUNTER — Other Ambulatory Visit: Payer: Self-pay

## 2019-09-26 ENCOUNTER — Ambulatory Visit: Payer: 59 | Admitting: Family Medicine

## 2019-09-26 ENCOUNTER — Encounter: Payer: Self-pay | Admitting: Family Medicine

## 2019-09-26 VITALS — BP 128/80 | HR 81 | Temp 97.6°F | Resp 17 | Ht 62.0 in | Wt 119.1 lb

## 2019-09-26 DIAGNOSIS — R519 Headache, unspecified: Secondary | ICD-10-CM | POA: Diagnosis not present

## 2019-09-26 DIAGNOSIS — Z23 Encounter for immunization: Secondary | ICD-10-CM

## 2019-09-26 DIAGNOSIS — R030 Elevated blood-pressure reading, without diagnosis of hypertension: Secondary | ICD-10-CM | POA: Diagnosis not present

## 2019-09-26 DIAGNOSIS — H5319 Other subjective visual disturbances: Secondary | ICD-10-CM | POA: Diagnosis not present

## 2019-09-26 DIAGNOSIS — H539 Unspecified visual disturbance: Secondary | ICD-10-CM

## 2019-09-26 LAB — VITAMIN D 25 HYDROXY (VIT D DEFICIENCY, FRACTURES): Vit D, 25-Hydroxy: 67 ng/mL (ref 30–100)

## 2019-09-26 LAB — VITAMIN B12: Vitamin B-12: 281 pg/mL (ref 200–1100)

## 2019-09-26 NOTE — Progress Notes (Signed)
This visit occurred during the SARS-CoV-2 public health emergency.  Safety protocols were in place, including screening questions prior to the visit, additional usage of staff PPE, and extensive cleaning of exam room while observing appropriate contact time as indicated for disinfecting solutions.    [name] , 12-09-1955, 64 y.o., female MRN: [id number] Patient Care Team    Relationship Specialty Notifications Start End  Ma Hillock, DO PCP - General Family Medicine  12/29/18   Gavin Pound, MD Consulting Physician Rheumatology  04/09/17   Maisie Fus, MD Consulting Physician Obstetrics and Gynecology  04/09/17   Devra Dopp, MD Referring Physician Dermatology  04/09/17   Group, [facility]  12/29/18    Comment: [facility]    12/29/18   [name], MD Consulting Physician Gastroenterology  12/29/18   Earnie Larsson, MD Consulting Physician Neurosurgery  12/29/18     Chief Complaint  Patient presents with   Hypertension    Pt states her BP is up and down. Having headaches and seeing flashes of light      Subjective: Pt presents for an OV with complaints of intermittent elevated blood pressures and more frequent headaches.  Patient states she has noticed mildly elevated blood pressures mostly when she checks it at work but occasionally she will see higher than normal blood pressures at home as well.  The highest she has seen is 150/86 and the lowest 110/68.  She reports for the last 2-3 weeks she has had increased headaches located along the side of her temples.  She also endorses seeing a flashing light in bilateral field of vision.  She reports the flashing lights appear directly in front of her vision.  She feels the headaches seem to only occur when the blood pressure readings are elevated.  Although she is not certain if they are elevated because she has a headache.  She has noticed her blood  pressures have been high a few times a week over the last 2-3 weeks.  All symptoms seem to have occurred around the same time.  She denies any fever, chills, nausea, vomit, photophobia, dizziness or worst headache of her life.  She is established with Dr. Velna Hatchet eye appointment 6 months ago and was normal.  Depression screen [facility] 2/9 04/27/2019 12/29/2018 04/20/2018 04/09/2017  Decreased Interest 0 2 0 0  Down, Depressed, Hopeless 0 3 0 0  PHQ - 2 Score 0 5 0 0  Altered sleeping - 2 2 2   Tired, decreased energy - 1 2 0  Change in appetite - 3 0 0  Feeling bad or failure about yourself  - 1 0 0  Trouble concentrating - 2 0 0  Moving slowly or fidgety/restless - 2 0 0  Suicidal thoughts - 1 0 0  PHQ-9 Score - 17 4 2   Difficult doing work/chores - Extremely dIfficult - -  Some recent data might be hidden    Allergies  Allergen Reactions   Aspartame And Phenylalanine Other (See Comments)    Migraines   Beef-Derived Products Other (See Comments)    severe cramping   Citrus Other (See Comments)    Causes Interstitial cystitis flares   Penicillins Hives    Denies airway involvement Has patient had a PCN reaction causing immediate rash, facial/tongue/throat swelling, SOB or lightheadedness with hypotension: yes hives.  Has patient had a PCN reaction causing severe rash involving mucus membranes or skin necrosis:no  Has patient had a PCN reaction that required hospitalization No Has patient had a PCN reaction occurring within the last 10 years: No If all of the above answers are "NO", then may proceed with Cephalosporin use.    Promethazine Hcl Other (See Comments)    Muscle twitching and contracture    Adhesive [Tape]     Red splotches   Doxycycline Nausea And Vomiting   Morphine Sulfate Nausea And Vomiting   Social History   Social History Narrative   Spiritual Beliefs: methodist   Marital status/children/pets: In second marriage.  First husband committed suicide.  2  children.   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 RA, ? Lupus - reports saw rheumatologist in the past but better now, sees Dr. Trenton Gammon   Eczema    GERD (gastroesophageal reflux disease)    takes Omeprazole daily, with nausea   Heart murmur    Hemorrhoids    History of kidney stones    passed on her own   History of migraine    last one a yr ago and takes Zomig prn   Hot flashes 11/10/2014   HTN (hypertension) 03/31/2012   Hx of echocardiogram    Echo (8/15):  EF 55-60%, no RWMA   IBS (irritable bowel syndrome)    constipation predominant   Interstitial cystitis    hx of UTI   Numbness    both legs and related to back   Osteoporosis    osteopenia   Palpitations    on metoprolol in past but made BP too low   Pneumonia    hx of;last time 60yrs ago   PONV (postoperative nausea and vomiting)    laryngospasm-1999   Poor vision 10/15/2015   -? Ocular rosacea -sees opthomology    Raynaud's disease /phenomenon 10/21/2012   no meds, worse in the winter or cold environment.    Rheumatoid aortitis    uses Diclofenac gel;Rheumatoid   Seasonal allergies    Flonase daily    Urinary incontinence    Past Surgical History:  Procedure Laterality Date   ABDOMINAL HYSTERECTOMY  1997   BREAST BIOPSY  1999   benign   COLONOSCOPY  02/11/2013 and 12/24/04   internal and external hemorrhoids   ENDOMETRIAL ABLATION     EXCISION MORTON'S NEUROMA Right    FLEXIBLE SIGMOIDOSCOPY  03/07/2008   internal and external hemorrhoids   POSTERIOR LUMBAR FUSION  05/02/2013    L4-L5 fusion (cage/screws/bone graft) Dr. Annette Stable   TONSILLECTOMY     UPPER GASTROINTESTINAL ENDOSCOPY  10/23/2010   gastritis, irregular Z-line   wisdom teeth extracted     Family History  Problem Relation Age of Onset   Coronary artery disease Father    Heart attack Father    Hypertension Father    Depression Father    Hyperlipidemia Father    Alcohol abuse Father  Early death Father    Kidney disease Father    Mental illness Father    Diverticulosis Mother    Hypertension Mother    Osteoarthritis Mother    Depression Mother    Hyperlipidemia Mother    Kidney disease Mother    Alcohol abuse Sister    Depression Sister    Diabetes Paternal Uncle    Hypertension Maternal Grandmother    Stroke Maternal Grandmother    Hypertension Maternal Grandfather    Pancreatic cancer Maternal Grandfather    Early death Maternal Grandfather    Stroke Paternal Grandfather    Hyperlipidemia Paternal Grandfather    Alcohol abuse Paternal Grandfather    Early death Paternal Grandfather    Hypertension Paternal Grandfather    Depression Paternal Grandmother    Dementia Paternal Grandmother    Arthritis Paternal Grandmother    Mental illness Paternal Grandmother    Alcohol abuse Daughter    Depression Daughter    Drug abuse Daughter    Alcohol abuse Son    Depression Son    Colon cancer Neg Hx    Allergies as of 09/26/2019      Reactions   Aspartame And Phenylalanine Other (See Comments)   Migraines   Beef-derived Products Other (See Comments)   severe cramping   Citrus Other (See Comments)   Causes Interstitial cystitis flares   Penicillins Hives   Denies airway involvement Has patient had a PCN reaction causing immediate rash, facial/tongue/throat swelling, SOB or lightheadedness with hypotension: yes hives.  Has patient had a PCN reaction causing severe rash involving mucus membranes or skin necrosis:no Has patient had a PCN reaction  that required hospitalization No Has patient had a PCN reaction occurring within the last 10 years: No If all of the above answers are "NO", then may proceed with Cephalosporin use.   Promethazine Hcl Other (See Comments)   Muscle twitching and contracture    Adhesive [tape]    Red splotches   Doxycycline Nausea And Vomiting   Morphine Sulfate Nausea And Vomiting      Medication List       Accurate as of September 26, 2019 11:59 PM. If you have any questions, ask your nurse or doctor.        Xanax XR 0.5 MG 24 hr tablet Generic drug: ALPRAZolam   ALPRAZolam 0.5 MG tablet Commonly known as: Xanax Take 1 tablet (0.5 mg total) by mouth 3 (three) times daily as needed for anxiety or sleep (panic).   cholecalciferol 1000 units tablet Commonly known as: VITAMIN D Take 1,000 Units by mouth every evening.   clobetasol 0.05 % external solution Commonly known as: TEMOVATE APPLY 1 APPLICATION TOPICALLY DAILY AS NEEDED FOR DERMATITIS   estradiol 0.075 MG/24HR Commonly known as: VIVELLE-DOT Place 1 patch onto the skin 2 (two) times a week. Wednesday and Sunday   estradiol 0.1 MG/24HR patch Commonly known as: VIVELLE-DOT   fluorometholone 0.1 % ophthalmic suspension Commonly known as: FML 1 drop 3 (three) times daily.   FLUoxetine 40 MG capsule Commonly known as: PROZAC Take 1 capsule (40 mg total) by mouth daily after breakfast.   fluticasone 50 MCG/ACT nasal spray Commonly known as: FLONASE Place 2 sprays into the nose daily.   lamoTRIgine 200 MG tablet Commonly known as: LAMICTAL Take 1 tablet (200 mg total) by mouth at bedtime.   meloxicam 15 MG tablet Commonly known as: MOBIC Take 1 tablet (15 mg total) by mouth daily.   multivitamin with minerals Tabs  tablet Take 1 tablet by mouth every evening.   Pazeo 0.7 % Soln Generic drug: Olopatadine HCl   Olopatadine HCl 0.2 % Soln   sucralfate 1 GM/10ML suspension Commonly known as: Carafate TAKE 10 MLS BY MOUTH 3  TIMES DAILY AS NEEDED FOR DIGESTION       All past medical history, surgical history, allergies, family history, immunizations andmedications were updated in the EMR today and reviewed under the history and medication portions of their EMR.     ROS: Negative, with the exception of above mentioned in HPI   Objective:  BP 128/80 (BP Location: Right Arm, Patient Position: Sitting, Cuff Size: Normal)    Pulse 81    Temp 97.6 F (36.4 C) (Temporal)    Resp 17    Ht 5\' 2"  (1.575 m)    Wt 119 lb 2 oz (54 kg)    SpO2 99%    BMI 21.79 kg/m  Body mass index is 21.79 kg/m. Gen: Afebrile. No acute distress. Nontoxic in appearance, well developed, well nourished.  Very pleasant Caucasian female. HENT: AT. [place]. Bilateral TM visualized within normal limits. MMM.  No cough or hoarseness present. Eyes:Pupils Equal Round Reactive to light, Extraocular movements intact,  Conjunctiva without redness, discharge or icterus. Neck/lymp/endocrine: Supple, no lymphadenopathy, no thyromegaly CV: RRR, no edema Chest: CTAB, no wheeze or crackles. Good air movement, normal resp effort.  Skin: No rashes, purpura or petechiae.  Neuro:  Normal gait. PERLA. EOMi. Alert. Oriented x3  Psych: Normal affect, dress and demeanor. Normal speech. Normal thought content and judgment.  No exam data present No results found. No results found for this or any previous visit (from the past 24 hour(s)).  Assessment/Plan: ATHEL CHIERA is a 64 y.o. female present for OV for  Elevated BP without diagnosis of hypertension/new onset headaches Blood pressure is normal and looks really good here today.  Discussed with her blood pressures naturally rise and fall in times of pain, stress or activity.  It becomes a concern when it is associated with concerning symptoms.  Currently uncertain if her headaches are secondary to her elevated blood sugars, or her elevated blood pressure secondary to her headaches.  They are not severely  elevated even at its highest of 150/86. We will collect labs today to rule out thyroid, electrolyte/vitamin deficiencies. Low-sodium diet encouraged.  Routine exercise. - Basic Metabolic Panel (BMET) - Magnesium - TSH - B12 - Vitamin D (25 hydroxy) She is to continue to monitor her blood pressure a few times a week when resting for at least 10 minutes and if she has a headache.  Is important to rule out headache as the cause of her blood pressure elevation.  Visual changes Her visual changes/flashes of light are concerning.  I strongly encouraged her to make an appointment to see her ophthalmologist immediately.  This could be possible causes of her increase in headaches and mildly elevated blood pressure. - B12 - Vitamin D (25 hydroxy) - Basic Metabolic Panel (BMET) - Magnesium - TSH  Need for shingles vaccine - Varicella-zoster vaccine IM.  Patient's last Covid shot was greater than 4 weeks.   Reviewed expectations re: course of current medical issues.  Discussed self-management of symptoms.  Outlined signs and symptoms indicating need for more acute intervention.  Patient verbalized understanding and all questions were answered.  Patient received an After-Visit Summary.    Orders Placed This Encounter  Procedures   Varicella-zoster vaccine IM   B12   Vitamin D (  25 hydroxy)   Basic Metabolic Panel (BMET)   Magnesium   TSH   No orders of the defined types were placed in this encounter.  Referral Orders  No referral(s) requested today     Note is dictated utilizing voice recognition software. Although note has been proof read prior to signing, occasional typographical errors still can be missed. If any questions arise, please do not hesitate to call for verification.   electronically signed by:  Howard Pouch, DO  [place]

## 2019-09-26 NOTE — Patient Instructions (Signed)
Check your blood pressure a few times a week- goal < 135/85 routinely (ideally < 130/80). Low sodium diet.  Make an appt ASAP w/ Dr. Katy Fitch for the flashes of light you are seeing.  Once we get lab results and if eye exam is without cause-- will then need to see you back for further evaluation.   Low-Sodium Eating Plan Sodium, which is an element that makes up salt, helps you maintain a healthy balance of fluids in your body. Too much sodium can increase your blood pressure and cause fluid and waste to be held in your body. Your health care provider or dietitian may recommend following this plan if you have high blood pressure (hypertension), kidney disease, liver disease, or heart failure. Eating less sodium can help lower your blood pressure, reduce swelling, and protect your heart, liver, and kidneys. What are tips for following this plan? General guidelines  Most people on this plan should limit their sodium intake to 1,500-2,000 mg (milligrams) of sodium each day. Reading food labels   The Nutrition Facts label lists the amount of sodium in one serving of the food. If you eat more than one serving, you must multiply the listed amount of sodium by the number of servings.  Choose foods with less than 140 mg of sodium per serving.  Avoid foods with 300 mg of sodium or more per serving. Shopping  Look for lower-sodium products, often labeled as "low-sodium" or "no salt added."  Always check the sodium content even if foods are labeled as "unsalted" or "no salt added".  Buy fresh foods. ? Avoid canned foods and premade or frozen meals. ? Avoid canned, cured, or processed meats  Buy breads that have less than 80 mg of sodium per slice. Cooking  Eat more home-cooked food and less restaurant, buffet, and fast food.  Avoid adding salt when cooking. Use salt-free seasonings or herbs instead of table salt or sea salt. Check with your health care provider or pharmacist before using salt  substitutes.  Cook with plant-based oils, such as canola, sunflower, or olive oil. Meal planning  When eating at a restaurant, ask that your food be prepared with less salt or no salt, if possible.  Avoid foods that contain MSG (monosodium glutamate). MSG is sometimes added to [place] food, bouillon, and some canned foods. What foods are recommended? The items listed may not be a complete list. Talk with your dietitian about what dietary choices are best for you. Grains Low-sodium cereals, including oats, puffed wheat and rice, and shredded wheat. Low-sodium crackers. Unsalted rice. Unsalted pasta. Low-sodium bread. Whole-grain breads and whole-grain pasta. Vegetables Fresh or frozen vegetables. "No salt added" canned vegetables. "No salt added" tomato sauce and paste. Low-sodium or reduced-sodium tomato and vegetable juice. Fruits Fresh, frozen, or canned fruit. Fruit juice. Meats and other protein foods Fresh or frozen (no salt added) meat, poultry, seafood, and fish. Low-sodium canned tuna and salmon. Unsalted nuts. Dried peas, beans, and lentils without added salt. Unsalted canned beans. Eggs. Unsalted nut butters. Dairy Milk. Soy milk. Cheese that is naturally low in sodium, such as ricotta cheese, fresh mozzarella, or Swiss cheese Low-sodium or reduced-sodium cheese. Cream cheese. Yogurt. Fats and oils Unsalted butter. Unsalted margarine with no trans fat. Vegetable oils such as canola or olive oils. Seasonings and other foods Fresh and dried herbs and spices. Salt-free seasonings. Low-sodium mustard and ketchup. Sodium-free salad dressing. Sodium-free light mayonnaise. Fresh or refrigerated horseradish. Lemon juice. Vinegar. Homemade, reduced-sodium, or low-sodium soups. Unsalted  popcorn and pretzels. Low-salt or salt-free chips. What foods are not recommended? The items listed may not be a complete list. Talk with your dietitian about what dietary choices are best for  you. Grains Instant hot cereals. Bread stuffing, pancake, and biscuit mixes. Croutons. Seasoned rice or pasta mixes. Noodle soup cups. Boxed or frozen macaroni and cheese. Regular salted crackers. Self-rising flour. Vegetables Sauerkraut, pickled vegetables, and relishes. Olives. [place] fries. Onion rings. Regular canned vegetables (not low-sodium or reduced-sodium). Regular canned tomato sauce and paste (not low-sodium or reduced-sodium). Regular tomato and vegetable juice (not low-sodium or reduced-sodium). Frozen vegetables in sauces. Meats and other protein foods Meat or fish that is salted, canned, smoked, spiced, or pickled. Bacon, ham, sausage, hotdogs, corned beef, chipped beef, packaged lunch meats, salt pork, jerky, pickled herring, anchovies, regular canned tuna, sardines, salted nuts. Dairy Processed cheese and cheese spreads. Cheese curds. Blue cheese. Feta cheese. String cheese. Regular cottage cheese. Buttermilk. Canned milk. Fats and oils Salted butter. Regular margarine. Ghee. Bacon fat. Seasonings and other foods Onion salt, garlic salt, seasoned salt, table salt, and sea salt. Canned and packaged gravies. Worcestershire sauce. Tartar sauce. Barbecue sauce. Teriyaki sauce. Soy sauce, including reduced-sodium. Steak sauce. Fish sauce. Oyster sauce. Cocktail sauce. Horseradish that you find on the shelf. Regular ketchup and mustard. Meat flavorings and tenderizers. Bouillon cubes. Hot sauce and Tabasco sauce. Premade or packaged marinades. Premade or packaged taco seasonings. Relishes. Regular salad dressings. Salsa. Potato and tortilla chips. Corn chips and puffs. Salted popcorn and pretzels. Canned or dried soups. Pizza. Frozen entrees and pot pies. Summary  Eating less sodium can help lower your blood pressure, reduce swelling, and protect your heart, liver, and kidneys.  Most people on this plan should limit their sodium intake to 1,500-2,000 mg (milligrams) of sodium each  day.  Canned, boxed, and frozen foods are high in sodium. Restaurant foods, fast foods, and pizza are also very high in sodium. You also get sodium by adding salt to food.  Try to cook at home, eat more fresh fruits and vegetables, and eat less fast food, canned, processed, or prepared foods. This information is not intended to replace advice given to you by your health care provider. Make sure you discuss any questions you have with your health care provider. Document Revised: 06/12/2017 Document Reviewed: 06/23/2016 Elsevier Patient Education  2020 [employer].

## 2019-09-27 ENCOUNTER — Telehealth: Payer: Self-pay | Admitting: Family Medicine

## 2019-09-27 LAB — BASIC METABOLIC PANEL
BUN: 15 mg/dL (ref 6–23)
CO2: 29 mEq/L (ref 19–32)
Calcium: 9.9 mg/dL (ref 8.4–10.5)
Chloride: 102 mEq/L (ref 96–112)
Creatinine, Ser: 1.01 mg/dL (ref 0.40–1.20)
GFR: 55.34 mL/min — ABNORMAL LOW (ref >60.00)
Glucose, Bld: 91 mg/dL (ref 70–99)
Potassium: 4.2 mEq/L (ref 3.5–5.1)
Sodium: 138 mEq/L (ref 135–145)

## 2019-09-27 LAB — MAGNESIUM: Magnesium: 2.3 mg/dL (ref 1.5–2.5)

## 2019-09-27 LAB — TSH: TSH: 1.88 u[IU]/mL (ref 0.35–4.50)

## 2019-09-27 NOTE — Telephone Encounter (Signed)
Please inform patient the following information:  -Electrolytes are all in balance. -Creatinine is in normal range-but a little higher than her prior creatinine levels.  This is decreasing her GFR to just below what is considered normal at 55.  Normal is 60. >  This can be caused from mild dehydration.  Encourage her to hydrate with water. -Her vitamin D levels are excellent at 67 -Her B12 levels are significantly low at 281.  Low B12 can cause headaches and fatigue.  I would encourage her to start daily OTC B12 supplementation [phone] mcg daily. -Follow-up with Dr. Katy Fitch is soon as possible for eye exam.  Follow-up here 6- 8 weeks for recheck on her symptoms and B12, sooner if symptoms are not showing improvement and her eye exam is normal

## 2019-09-28 ENCOUNTER — Telehealth: Payer: Self-pay

## 2019-09-28 NOTE — Telephone Encounter (Signed)
Patient's arm is still red, swollen, hard, and painful since receiving shringrix vaccine. Patient wants to know if it is normal. Please call

## 2019-09-28 NOTE — Telephone Encounter (Signed)
Pt was called and stated her arm is much better than it was. She was advised to use warm/cool compresses and OTC pain medications. She was offered a virtual visit and refused but will call back and schedule if needed. Pt instructed on S/S of infection and when to call for appt, she verbalized understanding

## 2019-09-28 NOTE — Telephone Encounter (Signed)
Pt was called and given all lab results and recommendations. She verbalized understanding. She does not know her work schedule so will call back to schedule.

## 2019-10-11 MED FILL — MELOXICAM 15 MG TABLET: 15 | 30 days supply | Qty: 30 | Fill #0

## 2019-10-13 ENCOUNTER — Telehealth: Payer: Self-pay

## 2019-10-13 MED ORDER — OMEPRAZOLE 40 MG PO CPDR: 40.0000 mg | DELAYED_RELEASE_CAPSULE | Freq: Every day | ORAL | 0 refills | Status: DC

## 2019-10-13 MED ORDER — SUCRALFATE 1 GM/10ML PO SUSP: ORAL | 2 refills | Status: DC

## 2019-10-13 MED FILL — OMEPRAZOLE 40 MG CPDR: 40 | 90 days supply | Qty: 90 | Fill #0

## 2019-10-13 MED FILL — SUCRALFATE 1 GM/10ML SUSP: 1 | 28 days supply | Qty: 840 | Fill #0

## 2019-10-13 NOTE — Telephone Encounter (Signed)
Patient called in requesting refill on her carafate and omeprazole as she is having GERD issues. She is aware that if she continues to have problems to make an appointment.

## 2019-10-27 ENCOUNTER — Ambulatory Visit: Payer: 59 | Admitting: Psychiatry

## 2019-11-02 ENCOUNTER — Other Ambulatory Visit: Payer: Self-pay

## 2019-11-02 ENCOUNTER — Ambulatory Visit: Payer: 59 | Admitting: Family Medicine

## 2019-11-02 VITALS — BP 110/70 | HR 92 | Temp 97.0°F | Resp 18 | Ht 62.0 in | Wt 117.6 lb

## 2019-11-02 DIAGNOSIS — N289 Disorder of kidney and ureter, unspecified: Secondary | ICD-10-CM | POA: Diagnosis not present

## 2019-11-02 DIAGNOSIS — E538 Deficiency of other specified B group vitamins: Secondary | ICD-10-CM

## 2019-11-02 DIAGNOSIS — R519 Headache, unspecified: Secondary | ICD-10-CM

## 2019-11-02 DIAGNOSIS — R030 Elevated blood-pressure reading, without diagnosis of hypertension: Secondary | ICD-10-CM | POA: Diagnosis not present

## 2019-11-02 LAB — BASIC METABOLIC PANEL
BUN: 11 mg/dL (ref 6–23)
CO2: 31 mEq/L (ref 19–32)
Calcium: 9.6 mg/dL (ref 8.4–10.5)
Chloride: 103 mEq/L (ref 96–112)
Creatinine, Ser: 0.93 mg/dL (ref 0.40–1.20)
GFR: 60.85 mL/min (ref >60.00)
Glucose, Bld: 80 mg/dL (ref 70–99)
Potassium: 4.8 mEq/L (ref 3.5–5.1)
Sodium: 139 mEq/L (ref 135–145)

## 2019-11-02 LAB — VITAMIN B12: Vitamin B-12: 330 pg/mL (ref 211–911)

## 2019-11-02 MED ORDER — ATENOLOL 25 MG PO TABS: 12.5000 mg | ORAL_TABLET | Freq: Every day | ORAL | 1 refills | Status: DC

## 2019-11-02 MED FILL — ATENOLOL 25 MG TABLET: 25 | 90 days supply | Qty: 45 | Fill #0

## 2019-11-02 NOTE — Patient Instructions (Signed)
Atenolol 1/2 tab before work.   I will call you with labs once available and guide you further on plan.

## 2019-11-02 NOTE — Progress Notes (Signed)
This visit occurred during the SARS-CoV-2 public health emergency.  Safety protocols were in place, including screening questions prior to the visit, additional usage of staff PPE, and extensive cleaning of exam room while observing appropriate contact time as indicated for disinfecting solutions.    [name] , 02/17/1956, 64 y.o., female MRN: [id number] Patient Care Team    Relationship Specialty Notifications Start End  Ma Hillock, DO PCP - General Family Medicine  12/29/18   Gavin Pound, MD Consulting Physician Rheumatology  04/09/17   Maisie Fus, MD Consulting Physician Obstetrics and Gynecology  04/09/17   Devra Dopp, MD Referring Physician Dermatology  04/09/17   Group, [facility]  12/29/18    Comment: [facility]    12/29/18   [name], MD Consulting Physician Gastroenterology  12/29/18   Earnie Larsson, MD Consulting Physician Neurosurgery  12/29/18     Chief Complaint  Patient presents with  . Hypertension    Pt states checking BPs at home. Pt states BPs are higher at work and fine at home.   . Follow-up     Subjective: Pt presents for an OV for follow-up on headache, low B12 and elevated blood pressures.  Patient reports she has started taking high-dose B12 she states is sublingual and around 5000 mcg per dose.  She is taking this every other day.  She has noticed her headaches have improved.  She reports her blood pressures are mildly elevated but seems to be at work only.  She reports there is a lot of stress involved with her job now.  There is changes in management and is creating more job-related stress.  She is prescribed Xanax and Lamictal by her psychiatry team. Prior note: intermittent elevated blood pressures and more frequent headaches.  Patient states she has noticed mildly elevated blood pressures mostly when she checks it at work but occasionally she will see higher  than normal blood pressures at home as well.  The highest she has seen is 150/86 and the lowest 110/68.  She reports for the last 2-3 weeks she has had increased headaches located along the side of her temples.  She also endorses seeing a flashing light in bilateral field of vision.  She reports the flashing lights appear directly in front of her vision.  She feels the headaches seem to only occur when the blood pressure readings are elevated.  Although she is not certain if they are elevated because she has a headache.  She has noticed her blood pressures have been high a few times a week over the last 2-3 weeks.  All symptoms seem to have occurred around the same time.  She denies any fever, chills, nausea, vomit, photophobia, dizziness or worst headache of her life.  She is established with Dr. Velna Hatchet eye appointment 6 months ago and was normal.  Depression screen [facility] 2/9 04/27/2019 12/29/2018 04/20/2018 04/09/2017  Decreased Interest 0 2 0 0  Down, Depressed, Hopeless 0 3 0 0  PHQ - 2 Score 0 5 0 0  Altered sleeping - 2 2 2   Tired, decreased energy - 1 2 0  Change in appetite - 3 0 0  Feeling bad or failure about yourself  - 1 0 0  Trouble concentrating - 2 0 0  Moving slowly or fidgety/restless - 2 0 0  Suicidal thoughts - 1 0 0  PHQ-9 Score - 17 4 2   Difficult  doing work/chores - Extremely dIfficult - -  Some recent data might be hidden    Allergies  Allergen Reactions  . Aspartame And Phenylalanine Other (See Comments)    Migraines  . Beef-Derived Products Other (See Comments)    severe cramping  . Citrus Other (See Comments)    Causes Interstitial cystitis flares  . Penicillins Hives    Denies airway involvement Has patient had a PCN reaction causing immediate rash, facial/tongue/throat swelling, SOB or lightheadedness with hypotension: yes hives.  Has patient had a PCN reaction causing severe rash involving mucus membranes or skin necrosis:no Has patient had a PCN reaction  that required hospitalization No Has patient had a PCN reaction occurring within the last 10 years: No If all of the above answers are "NO", then may proceed with Cephalosporin use.   . Promethazine Hcl Other (See Comments)    Muscle twitching and contracture   . Adhesive [Tape]     Red splotches  . Doxycycline Nausea And Vomiting  . Morphine Sulfate Nausea And Vomiting   Social History   Social History Narrative   Spiritual Beliefs: methodist   Marital status/children/pets: In second marriage.  First husband committed suicide.  2 children.   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 Allergy   . Anemia    after birth of child 50yrs ago  . Anxiety and depression    with insomnia  . Blood transfusion without reported diagnosis    x3  . Chronic back pain    ? RA, ? Lupus - reports saw rheumatologist in the past but better now, sees Dr. Trenton Gammon  . Eczema   . GERD (gastroesophageal reflux disease)    takes Omeprazole daily, with nausea  . Heart murmur   . Hemorrhoids   . History of kidney stones    passed on her own  . History of migraine    last one a yr ago and takes Zomig prn  . Hot flashes 11/10/2014  . HTN (hypertension) 03/31/2012  . Hx of echocardiogram    Echo (8/15):  EF 55-60%, no RWMA  . IBS (irritable bowel syndrome)    constipation predominant  . Interstitial cystitis    hx of UTI  . Numbness    both legs and related to back  . Osteoporosis    osteopenia  . Palpitations    on metoprolol in past but made BP too low  . Pneumonia    hx of;last time 85yrs ago  . PONV (postoperative nausea and vomiting)    laryngospasm-1999  . Poor vision 10/15/2015   -? Ocular rosacea -sees opthomology   . Raynaud's disease /phenomenon 10/21/2012    no meds, worse in the winter or cold environment.   . Rheumatoid aortitis    uses Diclofenac gel;Rheumatoid  . Seasonal allergies    Flonase daily   . Urinary incontinence    Past Surgical History:  Procedure Laterality Date  . ABDOMINAL HYSTERECTOMY  1997  . BREAST BIOPSY  1999   benign  . COLONOSCOPY  02/11/2013 and 12/24/04   internal and external hemorrhoids  . ENDOMETRIAL ABLATION    .  EXCISION MORTON'S NEUROMA Right   . FLEXIBLE SIGMOIDOSCOPY  03/07/2008   internal and external hemorrhoids  . POSTERIOR LUMBAR FUSION  05/02/2013   L4-L5 fusion (cage/screws/bone graft) Dr. Annette Stable  . TONSILLECTOMY    . UPPER GASTROINTESTINAL ENDOSCOPY  10/23/2010   gastritis, irregular Z-line  . wisdom teeth extracted     Family History  Problem Relation Age of Onset  . Coronary artery disease Father   . Heart attack Father   . Hypertension Father   . Depression Father   . Hyperlipidemia Father   . Alcohol abuse Father   . Early death Father   . Kidney disease Father   . Mental illness Father   . Diverticulosis Mother   . Hypertension Mother   . Osteoarthritis Mother   . Depression Mother   . Hyperlipidemia Mother   . Kidney disease Mother   . Alcohol abuse Sister   . Depression Sister   . Diabetes Paternal Uncle   . Hypertension Maternal Grandmother   . Stroke Maternal Grandmother   . Hypertension Maternal Grandfather   . Pancreatic cancer Maternal Grandfather   . Early death Maternal Grandfather   . Stroke Paternal Grandfather   . Hyperlipidemia Paternal Grandfather   . Alcohol abuse Paternal Grandfather   . Early death Paternal Grandfather   . Hypertension Paternal Grandfather   . Depression Paternal Grandmother   . Dementia Paternal Grandmother   . Arthritis Paternal Grandmother   . Mental illness Paternal Grandmother   . Alcohol abuse Daughter   . Depression Daughter   . Drug abuse Daughter   . Alcohol abuse Son   . Depression Son   . Colon cancer Neg Hx    Allergies  as of 11/02/2019      Reactions   Aspartame And Phenylalanine Other (See Comments)   Migraines   Beef-derived Products Other (See Comments)   severe cramping   Citrus Other (See Comments)   Causes Interstitial cystitis flares   Penicillins Hives   Denies airway involvement Has patient had a PCN reaction causing immediate rash, facial/tongue/throat swelling, SOB or lightheadedness with hypotension: yes hives.  Has patient had a PCN reaction causing severe rash involving mucus membranes or skin necrosis:no Has patient had a PCN reaction that required hospitalization No Has patient had a PCN reaction occurring within the last 10 years: No If all of the above answers are "NO", then may proceed with Cephalosporin use.   Promethazine Hcl Other (See Comments)   Muscle twitching and contracture    Adhesive [tape]    Red splotches   Doxycycline Nausea And Vomiting   Morphine Sulfate Nausea And Vomiting      Medication List       Accurate as of November 02, 2019 10:50 AM. If you have any questions, ask your nurse or doctor.        Xanax XR 0.5 MG 24 hr tablet Generic drug: ALPRAZolam   ALPRAZolam 0.5 MG tablet Commonly known as: Xanax Take 1 tablet (0.5 mg total) by mouth 3 (three) times daily as needed for anxiety or sleep (panic).   cholecalciferol 1000 units tablet Commonly known as: VITAMIN D Take 1,000 Units by mouth every evening.   clobetasol 0.05 % external solution Commonly known as: TEMOVATE APPLY 1 APPLICATION TOPICALLY DAILY AS NEEDED FOR DERMATITIS   estradiol 0.075 MG/24HR Commonly known as: VIVELLE-DOT Place 1 patch onto the skin 2 (two) times a week. Wednesday and Sunday   estradiol 0.1 MG/24HR patch Commonly known as:  VIVELLE-DOT   fluorometholone 0.1 % ophthalmic suspension Commonly known as: FML 1 drop 3 (three) times daily.   FLUoxetine 40 MG capsule Commonly known as: PROZAC Take 1 capsule (40 mg total) by mouth daily after breakfast.   fluticasone  50 MCG/ACT nasal spray Commonly known as: FLONASE Place 2 sprays into the nose daily.   lamoTRIgine 200 MG tablet Commonly known as: LAMICTAL Take 1 tablet (200 mg total) by mouth at bedtime.   meloxicam 15 MG tablet Commonly known as: MOBIC Take 1 tablet (15 mg total) by mouth daily.   multivitamin with minerals Tabs tablet Take 1 tablet by mouth every evening.   omeprazole 40 MG capsule Commonly known as: PRILOSEC Take 1 capsule (40 mg total) by mouth daily before breakfast.   Pazeo 0.7 % Soln Generic drug: Olopatadine HCl   Olopatadine HCl 0.2 % Soln   sucralfate 1 GM/10ML suspension Commonly known as: Carafate TAKE 10 MLS BY MOUTH 3 TIMES DAILY AS NEEDED FOR DIGESTION       All past medical history, surgical history, allergies, family history, immunizations andmedications were updated in the EMR today and reviewed under the history and medication portions of their EMR.     ROS: Negative, with the exception of above mentioned in HPI   Objective:  BP 110/70 (BP Location: Left Arm, Patient Position: Sitting, Cuff Size: Normal)   Pulse 92   Temp (!) 97 F (36.1 C) (Temporal)   Resp 18   Ht 5\' 2"  (1.575 m)   Wt 117 lb 9.6 oz (53.3 kg)   SpO2 96%   BMI 21.51 kg/m  Body mass index is 21.51 kg/m. Gen: Afebrile. No acute distress.  HENT: AT. [place].  Eyes:Pupils Equal Round Reactive to light, Extraocular movements intact,  Conjunctiva without redness, discharge or icterus. CV: RRR no murmur, no edema Chest: CTAB, no wheeze or crackles Neuro:  Normal gait. PERLA. EOMi. Alert. Oriented x3  Psych: Normal affect, dress and demeanor. Normal speech. Normal thought content and judgment. . No exam data present No results found. No results found for this or any previous visit (from the past 24 hour(s)).  Assessment/Plan: [name] is a 64 y.o. female present for OV for  Elevated BP without diagnosis of hypertension/new onset headaches/B12 deficiency -Headaches  have improved. - Blood pressure is normal here and reported normal at home.  Work seems to be a stressful environment that is creating elevated blood pressures secondary to anxiety when at work only.  Discussed options with her today and elected to start very low-dose atenolol 12.5 mg before work.  Patient will monitor for any signs of dizziness or low blood pressures. Will recheck BMP and B12 today.  Reports taking high-dose B12.  If B12 is not being absorbed will offer B12 injections.  Visual changes Visual changes resolved.  Patient reports she has been evaluated by her ophthalmologist and they report no concerns. Likely feature of her headaches.   Decreased kidney function: Repeat BMP today Continue hydration   Reviewed expectations re: course of current medical issues.  Discussed self-management of symptoms.  Outlined signs and symptoms indicating need for more acute intervention.  Patient verbalized understanding and all questions were answered.  Patient received an After-Visit Summary.    No orders of the defined types were placed in this encounter.  No orders of the defined types were placed in this encounter.  Referral Orders  No referral(s) requested today     Note is dictated utilizing voice recognition  software. Although note has been proof read prior to signing, occasional typographical errors still can be missed. If any questions arise, please do not hesitate to call for verification.   electronically signed by:  Howard Pouch, DO  [place]

## 2019-11-03 ENCOUNTER — Telehealth: Payer: Self-pay | Admitting: Family Medicine

## 2019-11-03 DIAGNOSIS — R519 Headache, unspecified: Secondary | ICD-10-CM | POA: Insufficient documentation

## 2019-11-03 DIAGNOSIS — N289 Disorder of kidney and ureter, unspecified: Secondary | ICD-10-CM | POA: Insufficient documentation

## 2019-11-03 DIAGNOSIS — E538 Deficiency of other specified B group vitamins: Secondary | ICD-10-CM | POA: Insufficient documentation

## 2019-11-03 DIAGNOSIS — R030 Elevated blood-pressure reading, without diagnosis of hypertension: Secondary | ICD-10-CM | POA: Insufficient documentation

## 2019-11-03 NOTE — Telephone Encounter (Signed)
Pt was called and given lab results and she would like to come for B12 injections. She was scheduling appt for first B12 inj when she got a phone call at work and had to D/C. She will return call to schedule nurse appt.

## 2019-11-03 NOTE — Telephone Encounter (Signed)
Please inform patient the following information: Good news, her kidney function returned to normal. Her B12 is higher, but not by much considering the high-dose B12 she said she is taking.  B12 281 > 330.  Please have her double check the label on her B12 she stated it said 5000 mcg. See if it is possibly 500 mcg instead.  If she truly is taking 5000 mcg and it only raised her B12 a minimal amount, she will likely need B12 injections in order to bring her B12 level up. If she confirms she is taking 5000 mcg daily and would like to start B12 injections, please set her up for: B12 injections every 2 weeks for 4 doses, then B12 injection once monthly x1.  Follow-up with provider 1 week after last injection.

## 2019-11-07 ENCOUNTER — Ambulatory Visit (INDEPENDENT_AMBULATORY_CARE_PROVIDER_SITE_OTHER): Payer: 59 | Admitting: Psychiatry

## 2019-11-07 ENCOUNTER — Ambulatory Visit (INDEPENDENT_AMBULATORY_CARE_PROVIDER_SITE_OTHER): Payer: 59

## 2019-11-07 ENCOUNTER — Other Ambulatory Visit: Payer: Self-pay

## 2019-11-07 DIAGNOSIS — F330 Major depressive disorder, recurrent, mild: Secondary | ICD-10-CM

## 2019-11-07 DIAGNOSIS — E538 Deficiency of other specified B group vitamins: Secondary | ICD-10-CM | POA: Diagnosis not present

## 2019-11-07 MED ORDER — CYANOCOBALAMIN 1000 MCG/ML IJ SOLN
1000.0000 ug | Freq: Once | INTRAMUSCULAR | Status: AC
  Administered 2019-11-07: 1000 ug via INTRAMUSCULAR

## 2019-11-07 NOTE — Progress Notes (Addendum)
[name] is a 64 y.o. female presents to the office today for B12 injection, #1 out of q2 week doses  Original order: start B12 injections, please set her up for: B12 injections every 2 weeks for 4 doses, then B12 injection once monthly x1.  Follow-up with provider 1 week after last injection.  34mL B12 R ventrolateral,  Patient tolerated injection. Patient due for follow up labs/provider appt: No. Patient next injection due: 11/21/2019 , appt made No  Caroll Rancher LPN   Medical screening examination/treatment/procedure(s) were performed by non-physician practitioner and as supervising physician I was immediately available for consultation/collaboration.  I agree with above assessment and plan.  Electronically Signed by: Howard Pouch, DO [place] [facility] [place]

## 2019-11-07 NOTE — Progress Notes (Signed)
      [facility] Counselor/Therapist Progress Note  Patient ID: [name], MRN: [id number],    Date: 11/07/2019  Time Spent: 60 minutes  11:00am to 12:00noon  Treatment Type: Individual Therapy  Reported Symptoms: depression, anxiety, stressed especially at work, "anxiety is the worst"  Mental Status Exam:  Appearance:   Casual     Behavior:  Appropriate, Sharing and Motivated  Motor:  Normal  Speech/Language:   Normal Rate  Affect:  anxious, depressed  Mood:  anxious, depressed and sad  Thought process:  goal directed  Thought content:    WNL  Sensory/Perceptual disturbances:    WNL  Orientation:  oriented to person, place, time/date, situation, day of week, month of year and year  Attention:  Good  Concentration:  Good  Memory:  WNL  Fund of knowledge:   Good  Insight:    Good  Judgment:   Good  Impulse Control:  Good   Risk Assessment: Danger to Self:  No Self-injurious Behavior: No Danger to Others: No Duty to Warn:no Physical Aggression / Violence:No  Access to Firearms a concern: No  Gang Involvement:No   Subjective: Patient in today with anxiety and increased depression.  No SI.  Significant increase in stressors at work ad in family.   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  Progress will be noted each session and documented in "Progress" section of treatment plan.  Long term goal: Develop healthy cognitive patterns and beliefs about self and the world that lead to alleviation of depression and anxiety and help prevent relapse.  Progressing:  Patient is motivated. Struggling to focus some due to immediate, unexpected health concern with husband.  Short term  goal: Learn and implement personal skills for managing stress, solving daily problems, and resolving conflicts effectively.  Strategy: Teach patient calming skills, problem solving skills, and conflict resolution skills, to better manage daily stressors.  Progressing: Patient reports anxiety, depression and feeling more stressed at work.  Anxiety has heightened with changes at work and with pending medical testing for husband at appt today.  Is very anxious and upset due to recent work situation that she shares today.  (Not all info included here due to patient privacy needs.). Needed most of session to process happening within work environment, depressive/anxious/fearful thoughts,  and her family stressors. Family situations still stressful including husband's illness, difficult relationship with mother, and patient not having many close relationships.  Denies any SI. Did feel more calm and grounded by session end.  Encouraged to use strategies that promote self-care including talking with a few lose friends and some with husband, going for rides on hr bike, meditation, and positive self-talk.   Goal review and progress/challenges noted with patient.  Next appt within 2 weeks.   Shanon Ace, LCSW

## 2019-11-09 MED FILL — SUBVENITE 200 MG TABS: 200 | 90 days supply | Qty: 90 | Fill #1

## 2019-11-16 ENCOUNTER — Ambulatory Visit: Payer: 59 | Admitting: Psychiatry

## 2019-11-21 ENCOUNTER — Other Ambulatory Visit: Payer: Self-pay

## 2019-11-21 ENCOUNTER — Ambulatory Visit (INDEPENDENT_AMBULATORY_CARE_PROVIDER_SITE_OTHER): Payer: 59 | Admitting: Psychiatry

## 2019-11-21 ENCOUNTER — Ambulatory Visit (INDEPENDENT_AMBULATORY_CARE_PROVIDER_SITE_OTHER): Payer: 59 | Admitting: Family Medicine

## 2019-11-21 DIAGNOSIS — F330 Major depressive disorder, recurrent, mild: Secondary | ICD-10-CM | POA: Diagnosis not present

## 2019-11-21 DIAGNOSIS — E538 Deficiency of other specified B group vitamins: Secondary | ICD-10-CM

## 2019-11-21 MED ORDER — CYANOCOBALAMIN 1000 MCG/ML IJ SOLN
1000.0000 ug | Freq: Once | INTRAMUSCULAR | Status: AC
  Administered 2019-11-21: 1000 ug via INTRAMUSCULAR

## 2019-11-21 NOTE — Progress Notes (Signed)
      [facility] Counselor/Therapist Progress Note  Patient ID: [name], MRN: [id number],    Date: 11/21/2019  Time Spent: 60 minutes   11:00am to 12:00noon  Treatment Type: Individual Therapy  Reported Symptoms: depression, anxiety, and "depression has eased some"; work stressors increased recently  Mental Status Exam:  Appearance:   Casual     Behavior:  Appropriate, Sharing and Motivated  Motor:  Normal  Speech/Language:   Normal Rate  Affect:  anxious  Mood:  anxious  Thought process:  normal  Thought content:    WNL  Sensory/Perceptual disturbances:    WNL  Orientation:  oriented to person, place, time/date, situation, day of week, month of year and year  Attention:  Good  Concentration:  Good  Memory:  WNL  Fund of knowledge:   Good  Insight:    Good  Judgment:   Good  Impulse Control:  Good   Risk Assessment: Danger to Self:  No Self-injurious Behavior: No Danger to Others: No Duty to Warn:no Physical Aggression / Violence:No  Access to Firearms a concern: No  Gang Involvement:No   Subjective: Patient today in with anxiety and depression , with the depression being less today. Issues with 11 yr old mom, and work stressors, in addition to husband's health concerns and her own health concerns.   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 Progress will be noted each session and documented in "Progress" section of treatment plan.  Long term goal: Develop healthy cognitive patterns and beliefs about self and the world that lead to alleviation of depression and anxiety and help prevent relapse.  Progressing:  Patient is motivated. Struggling to focus  some due to immediate, unexpected health concern with husband.  Short term goal: Learn and implement personal skills for managing stress, solving daily problems, and resolving conflicts effectively.  Strategy: Teach patient calming skills, problem solving skills, and conflict resolution skills, to better manage daily stressors.  Progressing: Patient today reporting anxiety and depression, with some decrease in her depression.  Work has become very stressful and she is going to be cutting her hours to 30 per week instead of 40.  Husband's recent testing (psa level) came back good so she is less worried about that.  Mom having health issues and that has increased currently. Still struggling some with the very difficult situations that occurred at her job just prior to last session and before that time. (Not all info included here due to patient privacy needs.) Per goals/stragies above, worked on [employer] and better ways to manage daily stressors. She is an active bike rider and gets a lot of good exercise. Encouraged good self-care including good stress management skills, contact with family/friends, deep breathing exercises, and positive self talk.  Goal review and progress/challenges noted with patient.  Next appt within 2 weeks.   Shanon Ace, LCSW

## 2019-11-21 NOTE — Progress Notes (Addendum)
[name] is a 64 y.o. female presents to the office today for B12 injection.  #2 out of 4 q2 week doses  Original order: start B12 injections,please set her up for:B12 injections every 2 weeks for 4 doses, then B12 injection once monthly x1. Follow-up with provider 1 week after last injection.  26mL B12 left upper outter quad,  Patient tolerated injection. Patient due for follow up labs/provider appt: Yes 1 week after 1 month B12 Injection.  Patient next injection due: 12/05/2019 , appt made No  Lattie Haw, [place] screening examination/treatment/procedure(s) were performed by non-physician practitioner and as supervising physician I was immediately available for consultation/collaboration.  I agree with above assessment and plan.  Electronically Signed by: Howard Pouch, DO [place] [facility] [place]

## 2019-11-23 ENCOUNTER — Encounter: Payer: Self-pay | Admitting: Psychiatry

## 2019-11-23 ENCOUNTER — Telehealth: Payer: Self-pay | Admitting: Psychiatry

## 2019-11-23 ENCOUNTER — Ambulatory Visit (INDEPENDENT_AMBULATORY_CARE_PROVIDER_SITE_OTHER): Payer: 59 | Admitting: Psychiatry

## 2019-11-23 ENCOUNTER — Other Ambulatory Visit: Payer: Self-pay

## 2019-11-23 VITALS — Ht 62.0 in | Wt 115.0 lb

## 2019-11-23 DIAGNOSIS — F330 Major depressive disorder, recurrent, mild: Secondary | ICD-10-CM

## 2019-11-23 DIAGNOSIS — F4310 Post-traumatic stress disorder, unspecified: Secondary | ICD-10-CM | POA: Diagnosis not present

## 2019-11-23 DIAGNOSIS — F3341 Major depressive disorder, recurrent, in partial remission: Secondary | ICD-10-CM | POA: Diagnosis not present

## 2019-11-23 DIAGNOSIS — F410 Panic disorder [episodic paroxysmal anxiety] without agoraphobia: Secondary | ICD-10-CM

## 2019-11-23 MED ORDER — ALPRAZOLAM 0.5 MG PO TABS: 0.5000 mg | ORAL_TABLET | Freq: Three times a day (TID) | ORAL | 2 refills | Status: DC | PRN

## 2019-11-23 MED ORDER — LAMOTRIGINE 200 MG PO TABS: 200.0000 mg | ORAL_TABLET | Freq: Every day | ORAL | 1 refills | Status: DC

## 2019-11-23 MED ORDER — XANAX XR 0.5 MG PO TB24: 0.5000 mg | ORAL_TABLET | Freq: Three times a day (TID) | ORAL | 2 refills | Status: DC | PRN

## 2019-11-23 NOTE — Telephone Encounter (Signed)
[name] outpatient pharmacy staff Amber and Aaron Edelman phone that Xanax eScription from this morning was extended release brand name 3 times a day reviewed relative to renewing of previous eScription for the generic short acting tablet.  The rescription is resent for entry error as alprazolam 0.5 mg 3 times daily as needed for anxiety or panic #90 with 2 refills [name].

## 2019-11-23 NOTE — Telephone Encounter (Signed)
Aaron Edelman called from the pharmacy and they need clarification on the xanax. Said that she has never been on the extended release and not 3 times a day

## 2019-11-23 NOTE — Progress Notes (Signed)
[facility]  Patient ID: [name],  MRN: [id number]  PCP: Ma Hillock, DO  Date of Evaluation: 11/23/2019 Time spent:20 minutes from 1120 to 1140  Chief Complaint:  Chief Complaint    Depression; Anxiety; Panic Attack; Trauma      HISTORY/CURRENT STATUS: [name] is seen onsite in office 20 minutes face-to-face individually with consent with epic collateral for psychiatric interview and exam and 60-month evaluation and management of major depression, PTSD, and panic disorder.  She hopes to resume therapy with Shanon Ace.  Husband's hormonal treatment for prostate cancer is  establishing partial remission or stabilization.  Her palpitations and hypertension are improved.  The Endo lab of GI remains busy but she but she has with the support of all been able to cut back and have a reduced schedule.  Palpitations and hypertension are better, and she is receiving B12 injections from PCP keeping up with all medical needs.  She is biking again and meeting other responsibilities.  She takes Xanax infrequently but has Lamictal and Prozac remaining her maintenance medications.  Prozac had seemed doubtful for benefit but augmentation with Pristiq and then Anafranil were unsuccessful now satisfied with the Prozac as the greatest stressor was work.  She has no mania, suicidality, psychosis or delirium.  Depression The patient hasrecurrent depression with most recent episodestartingmore than 33monthsago. The onset quality is sudden. The problem occurs intermittently.The problem has  improved now back to maintenance the last 2 months reduced work schedule. Despair and increased anxiety over realistic problems are stabilized.Associated symptoms includesocialinhibition, somatoform tension, decreased concentration,andnervous anxious behavior. Associated symptoms include no helplessness,doubt, restlessness,  irritability,nofatigue,noinsomnia,no suicidality, no  hopelessness,no decreased interestand no headaches.The symptoms are aggravated by work stress, social issues, family issues and medication.Past treatments include SSRIs - Selective serotonin reuptake inhibitors, TCAs - Tricyclic antidepressants, other medications and psychotherapy.Compliance with treatment is variable.Past compliance problems include medical issues, difficulty with treatment plan and medication issues.Previous treatment provided moderaterelief.Risk factors include a change in medication usage/dosage, emotional abuse, family history of mental illness, family history, family violence, history of mental illness, major life event, prior traumatic experience and stress. Past medical history includes chronic illness,physical disability,anxiety,depressionand mental health disorder. Pertinent negatives include no life-threatening condition,no recent psychiatric admission,no bipolar disorder,no eating disorder,no obsessive-compulsive disorder,no post-traumatic stress disorder,no schizophrenia,no suicide attemptsand no head trauma.  Individual Medical History/ Review of Systems: Changes? :Yes  weight is down 4 pounds by dietary adjusttment  Allergies: Aspartame and phenylalanine, Beef-derived products, Citrus, Penicillins, Promethazine hcl, Adhesive [tape], Beta vulgaris, Promethazine, Doxycycline, and Morphine sulfate  Current Medications:  Current Outpatient Medications:  .  atenolol (TENORMIN) 25 MG tablet, Take 0.5 tablets (12.5 mg total) by mouth daily., Disp: 45 tablet, Rfl: 1 .  cholecalciferol (VITAMIN D) 1000 UNITS tablet, Take 1,000 Units by mouth every evening., Disp: , Rfl:  .  clobetasol (TEMOVATE) 0.05 % external solution, APPLY 1 APPLICATION TOPICALLY DAILY AS NEEDED FOR DERMATITIS, Disp: 50 mL, Rfl: 2 .  estradiol (VIVELLE-DOT) 0.075 MG/24HR, Place 1 patch onto the skin 2 (two) times a week. Wednesday and Sunday, Disp: , Rfl:  .  estradiol  (VIVELLE-DOT) 0.1 MG/24HR patch, , Disp: , Rfl:  .  fluorometholone (FML) 0.1 % ophthalmic suspension, 1 drop 3 (three) times daily., Disp: , Rfl:  .  FLUoxetine (PROZAC) 40 MG capsule, Take 1 capsule (40 mg total) by mouth daily after breakfast., Disp: 90 capsule, Rfl: 1 .  fluticasone (FLONASE) 50 MCG/ACT nasal spray, Place 2 sprays into the nose  daily., Disp: 16 g, Rfl: 5 .  lamoTRIgine (LAMICTAL) 200 MG tablet, Take 1 tablet (200 mg total) by mouth at bedtime., Disp: 90 tablet, Rfl: 1 .  meloxicam (MOBIC) 15 MG tablet, Take 1 tablet (15 mg total) by mouth daily., Disp: 30 tablet, Rfl: 1 .  Multiple Vitamin (MULTIVITAMIN WITH MINERALS) TABS, Take 1 tablet by mouth every evening., Disp: , Rfl:  .  Olopatadine HCl 0.2 % SOLN, , Disp: , Rfl:  .  omeprazole (PRILOSEC) 40 MG capsule, Take 1 capsule (40 mg total) by mouth daily before breakfast., Disp: 90 capsule, Rfl: 0 .  PAZEO 0.7 % SOLN, , Disp: , Rfl: 98 .  sucralfate (CARAFATE) 1 GM/10ML suspension, TAKE 10 MLS BY MOUTH 3 TIMES DAILY AS NEEDED FOR DIGESTION, Disp: 840 mL, Rfl: 2 .  XANAX XR 0.5 MG 24 hr tablet, Take 1 tablet (0.5 mg total) by mouth 3 (three) times daily as needed for anxiety (Panic)., Disp: 90 tablet, Rfl: 2  Medication Side Effects: none  Family Medical/ Social History: Changes? Yes with reduced schedule and hours at work being a major relief  MENTAL HEALTH EXAM:  Height 5\' 2"  (1.575 m), weight 115 lb (52.2 kg).Body mass index is 21.03 kg/m. Muscle strengths and tone 5/5, postural reflexes and gait 0/0, and AIMS = 0.  General Appearance: Casual, Meticulous and Well Groomed  Eye Contact:  Good  Speech:  Clear and Coherent, Normal Rate and Talkative  Volume:  Normal  Mood:  Anxious, Dysphoric and Euthymic  Affect:  Congruent, Inappropriate, Full Range and Anxious  Thought Process:  Coherent, Goal Directed, Irrelevant and Descriptions of Associations: Circumstantial  Orientation:  Full (Time, Place, and Person)  Thought  Content: Obsessions   Suicidal Thoughts:  No  Homicidal Thoughts:  No  Memory:  Immediate;   Good Remote;   Good  Judgement:  Good  Insight:  Fair  Psychomotor Activity:  Normal and Mannerisms  Concentration:  Concentration: Good and Attention Span: Good  Recall:  Good  Fund of Knowledge: Good  Language: Good  Assets:  Desire for Improvement Resilience Talents/Skills Vocational/Educational  ADL's:  Intact  Cognition: WNL  Prognosis:  Good    DIAGNOSES:    ICD-10-CM   1. Recurrent major depression in partial remission (HCC)  F33.41   2. PTSD (post-traumatic stress disorder)  F43.10 XANAX XR 0.5 MG 24 hr tablet    lamoTRIgine (LAMICTAL) 200 MG tablet  3. Panic disorder  F41.0 XANAX XR 0.5 MG 24 hr tablet  4. Mild recurrent major depression (HCC)  F33.0 lamoTRIgine (LAMICTAL) 200 MG tablet    Receiving Psychotherapy: Yes with Shanon Ace, LCSW    RECOMMENDATIONS: Psychosupportive psychoeducation integrated cognitive behavioral therapeutics: Symptom treatment matching to continue Lamictal 200 mg every bedtime #90 with 1 refill sent to [facility] outpatient for major depression and PTSD.  She has adequate supply of Prozac 40 mg every morning for anxiety and depression.  She is E scribed Xanax 0.5 mg 3 times daily as needed for anxiety initially transmitted by epic as the ODT resent as regular tablet #90 with 2 refills to [facility] outpatient for panic and PTSD.  She returns in 6 months or sooner if needed.   Delight Hoh, MD

## 2019-11-23 NOTE — Telephone Encounter (Signed)
Pharmacy called to verify if Xanax can be generic or brand. Call back at [phone] [facility]

## 2019-12-06 ENCOUNTER — Ambulatory Visit (INDEPENDENT_AMBULATORY_CARE_PROVIDER_SITE_OTHER): Payer: 59

## 2019-12-06 ENCOUNTER — Ambulatory Visit: Payer: 59

## 2019-12-06 ENCOUNTER — Telehealth: Payer: Self-pay

## 2019-12-06 ENCOUNTER — Other Ambulatory Visit: Payer: Self-pay

## 2019-12-06 DIAGNOSIS — E538 Deficiency of other specified B group vitamins: Secondary | ICD-10-CM | POA: Diagnosis not present

## 2019-12-06 MED ORDER — CYANOCOBALAMIN 1000 MCG/ML IJ SOLN
1000.0000 ug | Freq: Once | INTRAMUSCULAR | Status: AC
  Administered 2019-12-06: 1000 ug via INTRAMUSCULAR

## 2019-12-06 NOTE — Progress Notes (Addendum)
[name] Hodgesis a 64 y.o.femalepresents to the office today for B12 injection.  #3 out of 4 q2 week doses  Original order:start B12 injections,please set her up for:B12 injections every 2 weeks for 4 doses, then B12 injection once monthly x1. Follow-up with provider 1 week after last injection.  29mL B12 left upper outter quad,Patient tolerated injection. Patient due for follow up labs/provider appt:Yes 1 week after 1 month B12 Injection.  Patient next injection due:12/20/2019, appt made No  Caroll Rancher LPN   Medical screening examination/treatment/procedure(s) were performed by non-physician practitioner and as supervising physician I was immediately available for consultation/collaboration.  I agree with above assessment and plan.  Electronically Signed by: Howard Pouch, DO [facility] [place]

## 2019-12-06 NOTE — Telephone Encounter (Signed)
Tried calling patient this morning regarding appt time for b12 injection. The latest time we could do is 4:15. Left message for patient to [facility]

## 2019-12-15 ENCOUNTER — Ambulatory Visit (INDEPENDENT_AMBULATORY_CARE_PROVIDER_SITE_OTHER): Payer: 59 | Admitting: Psychiatry

## 2019-12-15 ENCOUNTER — Other Ambulatory Visit: Payer: Self-pay

## 2019-12-15 DIAGNOSIS — F411 Generalized anxiety disorder: Secondary | ICD-10-CM

## 2019-12-15 NOTE — Progress Notes (Signed)
      [facility] Counselor/Therapist Progress Note  Patient ID: [name], MRN: [id number],    Date: 12/15/2019  Time Spent: 60 minutes  11:00am to 12:00noon  Treatment Type: Individual Therapy  Reported Symptoms: lots of anxiety, depression much better, still concerned with mother's health issues  Mental Status Exam:  Appearance:   Casual     Behavior:  Appropriate, Sharing and Motivated  Motor:  Normal  Speech/Language:   Normal Rate  Affect:  anxious  Mood:  anxious  Thought process:  normal  Thought content:    WNL  Sensory/Perceptual disturbances:    WNL  Orientation:  oriented to person, place, time/date, situation, day of week, month of year and year  Attention:  Good  Concentration:  Good  Memory:  WNL  Fund of knowledge:   Good  Insight:    Good  Judgment:   Good  Impulse Control:  Good   Risk Assessment: Danger to Self:  No Self-injurious Behavior: No Danger to Others: No Duty to Warn:no Physical Aggression / Violence:No  Access to Firearms a concern: No  Gang Involvement:No   Subjective: Patient today reports increased anxiety especially work. Exercise helps me cope.    Interventions: Cognitive Behavioral Therapy and Solution-Oriented/Positive Psychology  Diagnosis:   ICD-10-CM   1. Generalized anxiety disorder  F41.1      Plan: Patient not signing the tx plan on computer screen due to COVID  Treatment Goals: Goals remain on treatment plan as patient works on strategies to meet her goals. Progress will be noted each session and documented in "Progress" section of treatment plan.  Long term goal: Develop healthy cognitive patterns and beliefs about self and the world that lead to alleviation of depression and anxiety and help prevent relapse.  Progressing:  Patient is motivated. Struggling to focus some due to immediate, unexpected health concern with husband.  Short term goal: Learn and implement personal skills for managing  stress, solving daily problems, and resolving conflicts effectively.  Strategy: Teach patient calming skills, problem solving skills, and conflict resolution skills, to better manage daily stressors.  Progressing: Patient in today with increased anxiety, but depression has lessened.  Work has been very stressful and impacted her anxiety level. Some issues with adult daughter and she is trying to address them in helpful ways but also have good boundaries. Also concerns with her elderly  mother in [place] and her health issues. Vented a lot of her work concerns and looked at "what she can control and what she can't control" in addition to better self-care in her work environment, and beyond. Further discussed communication skills in working through concerns with her mother and her daughter, being sensitive to them but also maintaining healthier boundaries with both of them.  Emphasized the listening part of communication as well as the verbal/non-verbal parts, specifically "how we say, what we say" and  "listening to really hear someone versus listening just to respond."   Goal review and progress noted with patient.  Next appt within  2-3 weeks.   Shanon Ace, LCSW

## 2019-12-19 DIAGNOSIS — Z981 Arthrodesis status: Secondary | ICD-10-CM | POA: Diagnosis not present

## 2019-12-19 DIAGNOSIS — M47816 Spondylosis without myelopathy or radiculopathy, lumbar region: Secondary | ICD-10-CM | POA: Diagnosis not present

## 2019-12-19 DIAGNOSIS — M47812 Spondylosis without myelopathy or radiculopathy, cervical region: Secondary | ICD-10-CM | POA: Insufficient documentation

## 2019-12-26 ENCOUNTER — Other Ambulatory Visit: Payer: Self-pay

## 2019-12-26 ENCOUNTER — Ambulatory Visit (INDEPENDENT_AMBULATORY_CARE_PROVIDER_SITE_OTHER): Payer: 59 | Admitting: Family Medicine

## 2019-12-26 ENCOUNTER — Ambulatory Visit (INDEPENDENT_AMBULATORY_CARE_PROVIDER_SITE_OTHER): Payer: 59 | Admitting: Psychiatry

## 2019-12-26 DIAGNOSIS — F411 Generalized anxiety disorder: Secondary | ICD-10-CM | POA: Diagnosis not present

## 2019-12-26 DIAGNOSIS — E538 Deficiency of other specified B group vitamins: Secondary | ICD-10-CM | POA: Diagnosis not present

## 2019-12-26 MED ORDER — CYANOCOBALAMIN 1000 MCG/ML IJ SOLN
1000.0000 ug | Freq: Once | INTRAMUSCULAR | Status: AC
  Administered 2019-12-26: 1000 ug via INTRAMUSCULAR

## 2019-12-26 NOTE — Progress Notes (Addendum)
Carol Brier Hodgesis a 64 y.o.femalepresents to the office today for B12 injection.  #4out of 4q2 week doses  Original order:start B12 injections,please set her up for:B12 injections every 2 weeks for 4 doses, then B12 injection once monthly x1. Follow-up with provider 1 week after last injection.  4mL B12left upper outter quad,Patient tolerated injection. Patient due for follow up labs/provider appt:Yes 1 week after 1 month B12 Injection.  Patient next injection due:01/25/2020, appt made yes  Lattie Haw, [facility]  Medical screening examination/treatment/procedure(s) were performed by non-physician practitioner and as supervising physician I was immediately available for consultation/collaboration.  I agree with above assessment and plan.  Electronically Signed by: Howard Pouch, DO [facility] [place]

## 2019-12-26 NOTE — Progress Notes (Signed)
      [facility] Counselor/Therapist Progress Note  Patient ID: [name], MRN: [id number],    Date: 12/26/2019  Time Spent: 60 minutes   10:00am to 11:00am  Treatment Type: Individual Therapy  Reported Symptoms: anxiety much less and only episodic  Mental Status Exam:  Appearance:   Casual     Behavior:  Appropriate, Sharing and Motivated  Motor:  Normal  Speech/Language:   Clear and Coherent  Affect:  anxious decreased significantly  Mood:  anxious decreased significantly  Thought process:  normal  Thought content:    WNL  Sensory/Perceptual disturbances:    WNL  Orientation:  oriented to person, place, time/date, situation, day of week, month of year and year  Attention:  Good  Concentration:  Good  Memory:  WNL  Fund of knowledge:   Good  Insight:    Good  Judgment:   Good  Impulse Control:  Good   Risk Assessment: Danger to Self:  No Self-injurious Behavior: No Danger to Others: No Duty to Warn:no Physical Aggression / Violence:No  Access to Firearms a concern: No  Gang Involvement:No   Subjective: Patient today reporting considerable improvement and "feeling more normal."  Interventions: Cognitive Behavioral Therapy, Solution-Oriented/Positive Psychology and Ego-Supportive  Diagnosis:   ICD-10-CM   1. Generalized anxiety disorder  F41.1     Plan: Patient not signing the tx plan on computer screen due to COVID  Treatment Goals: Goals remain on treatment plan as patient works on strategies to meet her goals. Progress will be noted each session and documented in "Progress" section of treatment plan.  Long term goal: Develop healthy cognitive patterns and beliefs about self and the world that lead to alleviation of depression and anxiety and help prevent relapse.  Progressing:  Patient is motivated. Struggling to focus some due to immediate, unexpected health concern with husband.  Short term goal: Learn and implement personal skills for  managing stress, solving daily problems, and resolving conflicts effectively.  Strategy: Teach patient calming skills, problem solving skills, and conflict resolution skills, to better manage daily stressors.  Progressing: Patient in today reporting good progress and feeling much better. Has changed her work schedule from full-time to 3 days per week.  Her affect is more full and bright. Thorough goal review with patient and she is doing well. Has worked hard to progress on her goals and has also increased her overall well-being mentally and emotionally, as well as physically.  She is feeling good about her progress and plans to continue her behavioral and attitudinal changes especially as they relate to better care of herself.  Significant efforts and progress made by patient.  Does not need to reschedule at this time.  Will call for return appt if needed.   Shanon Ace, LCSW

## 2020-01-05 ENCOUNTER — Other Ambulatory Visit (HOSPITAL_COMMUNITY): Payer: Self-pay | Admitting: Obstetrics & Gynecology

## 2020-01-05 MED FILL — ESTRADIOL 0.1 MG/GM CREA: 0.1 | 90 days supply | Qty: 43 | Fill #0

## 2020-01-10 ENCOUNTER — Encounter: Payer: Self-pay | Admitting: Family Medicine

## 2020-01-10 ENCOUNTER — Ambulatory Visit: Payer: 59 | Admitting: Family Medicine

## 2020-01-10 ENCOUNTER — Other Ambulatory Visit: Payer: Self-pay

## 2020-01-10 VITALS — BP 122/76 | HR 67 | Temp 98.1°F | Resp 18 | Ht 62.0 in | Wt 117.0 lb

## 2020-01-10 DIAGNOSIS — T169XXA Foreign body in ear, unspecified ear, initial encounter: Secondary | ICD-10-CM

## 2020-01-10 DIAGNOSIS — H9311 Tinnitus, right ear: Secondary | ICD-10-CM | POA: Diagnosis not present

## 2020-01-10 DIAGNOSIS — H6981 Other specified disorders of Eustachian tube, right ear: Secondary | ICD-10-CM

## 2020-01-10 NOTE — Progress Notes (Signed)
This visit occurred during the SARS-CoV-2 public health emergency.  Safety protocols were in place, including screening questions prior to the visit, additional usage of staff PPE, and extensive cleaning of exam room while observing appropriate contact time as indicated for disinfecting solutions.    [name] , September 06, 1955, 64 y.o., female MRN: [id number] Patient Care Team    Relationship Specialty Notifications Start End  Ma Hillock, DO PCP - General Family Medicine  12/29/18   Gavin Pound, MD Consulting Physician Rheumatology  04/09/17   Maisie Fus, MD Consulting Physician Obstetrics and Gynecology  04/09/17   Devra Dopp, MD Referring Physician Dermatology  04/09/17   Group, [place]  12/29/18    Comment: [facility]    12/29/18   [name], MD Consulting Physician Gastroenterology  12/29/18   Earnie Larsson, MD Consulting Physician Neurosurgery  12/29/18     Chief Complaint  Patient presents with  . Hearing Loss    Pt states she started feeling ear fullness in her R ear. Pt has tried cleaning her ear out many times.      Subjective: Pt presents for an OV with complaints of right ear fullness of 4 months duration.  Associated symptoms include tinnitus, ear fullness and decreased hearing.  She denies any pain, fever or recent acute illness. She is not had problems with either ear since she was a child.  As a child she had frequent ear infections.  Pt has tried over-the-counter wax removal kits, candling, hydrogen peroxide to ease symptoms.  She takes Flonase daily.  She is not on an allergy pill.  Depression screen [facility] 2/9 04/27/2019 12/29/2018 04/20/2018 04/09/2017  Decreased Interest 0 2 0 0  Down, Depressed, Hopeless 0 3 0 0  PHQ - 2 Score 0 5 0 0  Altered sleeping - 2 2 2   Tired, decreased energy - 1 2 0  Change in appetite - 3 0 0  Feeling bad or failure about yourself  - 1 0 0    Trouble concentrating - 2 0 0  Moving slowly or fidgety/restless - 2 0 0  Suicidal thoughts - 1 0 0  PHQ-9 Score - 17 4 2   Difficult doing work/chores - Extremely dIfficult - -  Some recent data might be hidden    Allergies  Allergen Reactions  . Aspartame And Phenylalanine Other (See Comments)    Migraines  . Beef-Derived Products Other (See Comments)    severe cramping  . Citrus Other (See Comments)    Causes Interstitial cystitis flares  . Penicillins Hives    Denies airway involvement Has patient had a PCN reaction causing immediate rash, facial/tongue/throat swelling, SOB or lightheadedness with hypotension: yes hives.  Has patient had a PCN reaction causing severe rash involving mucus membranes or skin necrosis:no Has patient had a PCN reaction that required hospitalization No Has patient had a PCN reaction occurring within the last 10 years: No If all of the above answers are "NO", then may proceed with Cephalosporin use.   . Promethazine Hcl Other (See Comments)    Muscle twitching and contracture   . Adhesive [Tape]     Red splotches  . Beta Vulgaris   . Promethazine   . Doxycycline Nausea And Vomiting  . Morphine Sulfate Nausea And Vomiting   Social History   Social History Narrative   Spiritual Beliefs: methodist   Marital status/children/pets: In second marriage.  First husband committed suicide.  2 children.   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 Allergy   . Anemia    after birth of child 75yrs ago  . Anxiety and depression    with insomnia  . Blood transfusion without reported diagnosis    x3  . Chronic back pain    ? RA, ? Lupus - reports saw rheumatologist in the past but better now, sees Dr. Trenton Gammon  . Eczema   .  GERD (gastroesophageal reflux disease)    takes Omeprazole daily, with nausea  . Heart murmur   . Hemorrhoids   . History of kidney stones    passed on her own  . History of migraine    last one a yr ago and takes Zomig prn  . Hot flashes 11/10/2014  . HTN (hypertension) 03/31/2012  . Hx of echocardiogram    Echo (8/15):  EF 55-60%, no RWMA  . IBS (irritable bowel syndrome)    constipation predominant  . Interstitial cystitis    hx of UTI  . Numbness    both legs and related to back  . Osteoporosis    osteopenia  . Palpitations    on metoprolol in past but made BP too low  . Pneumonia    hx of;last time 19yrs ago  . PONV (postoperative nausea and vomiting)    laryngospasm-1999  . Poor vision 10/15/2015   -? Ocular rosacea -sees opthomology   . Raynaud's disease /phenomenon 10/21/2012   no meds, worse in the winter or cold environment.   . Rheumatoid aortitis    uses Diclofenac gel;Rheumatoid  . Seasonal allergies    Flonase daily   . Urinary incontinence    Past Surgical History:  Procedure Laterality Date  . ABDOMINAL HYSTERECTOMY  1997  . BREAST BIOPSY  1999   benign  . COLONOSCOPY  02/11/2013 and 12/24/04   internal and external hemorrhoids  . ENDOMETRIAL ABLATION    . EXCISION MORTON'S NEUROMA Right   . FLEXIBLE SIGMOIDOSCOPY  03/07/2008   internal and external hemorrhoids  . POSTERIOR LUMBAR FUSION  05/02/2013   L4-L5 fusion (cage/screws/bone graft) Dr. Annette Stable  . TONSILLECTOMY    . UPPER GASTROINTESTINAL ENDOSCOPY  10/23/2010   gastritis, irregular Z-line  . wisdom teeth extracted     Family History  Problem Relation Age of Onset  . Coronary artery disease Father   . Heart attack Father   . Hypertension Father   . Depression Father   . Hyperlipidemia Father   . Alcohol abuse Father   . Early death Father   . Kidney disease Father   . Mental illness Father   . Diverticulosis Mother   . Hypertension Mother   . Osteoarthritis Mother   . Depression Mother    . Hyperlipidemia Mother   . Kidney disease Mother   . Alcohol abuse Sister   . Depression Sister   . Diabetes Paternal Uncle   . Hypertension Maternal Grandmother   . Stroke Maternal Grandmother   . Hypertension Maternal Grandfather   . Pancreatic cancer Maternal Grandfather   . Early death Maternal Grandfather   .  Stroke Paternal Grandfather   . Hyperlipidemia Paternal Grandfather   . Alcohol abuse Paternal Grandfather   . Early death Paternal Grandfather   . Hypertension Paternal Grandfather   . Depression Paternal Grandmother   . Dementia Paternal Grandmother   . Arthritis Paternal Grandmother   . Mental illness Paternal Grandmother   . Alcohol abuse Daughter   . Depression Daughter   . Drug abuse Daughter   . Alcohol abuse Son   . Depression Son   . Colon cancer Neg Hx    Allergies as of 01/10/2020      Reactions   Aspartame And Phenylalanine Other (See Comments)   Migraines   Beef-derived Products Other (See Comments)   severe cramping   Citrus Other (See Comments)   Causes Interstitial cystitis flares   Penicillins Hives   Denies airway involvement Has patient had a PCN reaction causing immediate rash, facial/tongue/throat swelling, SOB or lightheadedness with hypotension: yes hives.  Has patient had a PCN reaction causing severe rash involving mucus membranes or skin necrosis:no Has patient had a PCN reaction that required hospitalization No Has patient had a PCN reaction occurring within the last 10 years: No If all of the above answers are "NO", then may proceed with Cephalosporin use.   Promethazine Hcl Other (See Comments)   Muscle twitching and contracture    Adhesive [tape]    Red splotches   Beta Vulgaris    Promethazine    Doxycycline Nausea And Vomiting   Morphine Sulfate Nausea And Vomiting      Medication List       Accurate as of January 10, 2020  9:30 AM. If you have any questions, ask your nurse or doctor.        ALPRAZolam 0.5 MG  tablet Commonly known as: XANAX Take 1 tablet (0.5 mg total) by mouth 3 (three) times daily as needed for anxiety.   atenolol 25 MG tablet Commonly known as: TENORMIN Take 0.5 tablets (12.5 mg total) by mouth daily.   cholecalciferol 1000 units tablet Commonly known as: VITAMIN D Take 1,000 Units by mouth every evening.   clobetasol 0.05 % external solution Commonly known as: TEMOVATE APPLY 1 APPLICATION TOPICALLY DAILY AS NEEDED FOR DERMATITIS   estradiol 0.075 MG/24HR Commonly known as: VIVELLE-DOT Place 1 patch onto the skin 2 (two) times a week. Wednesday and Sunday   estradiol 0.1 MG/24HR patch Commonly known as: VIVELLE-DOT   fluorometholone 0.1 % ophthalmic suspension Commonly known as: FML 1 drop 3 (three) times daily.   FLUoxetine 40 MG capsule Commonly known as: PROZAC Take 1 capsule (40 mg total) by mouth daily after breakfast.   fluticasone 50 MCG/ACT nasal spray Commonly known as: FLONASE Place 2 sprays into the nose daily.   lamoTRIgine 200 MG tablet Commonly known as: LAMICTAL Take 1 tablet (200 mg total) by mouth at bedtime.   meloxicam 15 MG tablet Commonly known as: MOBIC Take 1 tablet (15 mg total) by mouth daily.   multivitamin with minerals Tabs tablet Take 1 tablet by mouth every evening.   omeprazole 40 MG capsule Commonly known as: PRILOSEC Take 1 capsule (40 mg total) by mouth daily before breakfast.   Pazeo 0.7 % Soln Generic drug: Olopatadine HCl   Olopatadine HCl 0.2 % Soln   sucralfate 1 GM/10ML suspension Commonly known as: Carafate TAKE 10 MLS BY MOUTH 3 TIMES DAILY AS NEEDED FOR DIGESTION       All past medical history, surgical history, allergies, family history, immunizations  andmedications were updated in the EMR today and reviewed under the history and medication portions of their EMR.     ROS: Negative, with the exception of above mentioned in HPI   Objective:  BP 122/76 (BP Location: Right Arm, Patient Position:  Sitting, Cuff Size: Normal)   Pulse 67   Temp 98.1 F (36.7 C) (Temporal)   Resp 18   Ht 5\' 2"  (1.575 m)   Wt 117 lb (53.1 kg)   SpO2 98%   BMI 21.40 kg/m  Body mass index is 21.4 kg/m. Gen: Afebrile. No acute distress. Nontoxic in appearance, well developed, well nourished.  HENT: AT. [place]. Bilateral TM visualized left TM cloudy in appearance, no erythema or bulging present.  Right TM with clear effusion present.  Minimal cerumen in bilateral external auditory canals.  A hair is present in the external auditory canal ear right TM. MMM, no oral lesions. Bilateral nares without erythema, swelling or drainage. Throat without erythema or exudates.  No cough.  No hoarseness. Eyes:Pupils Equal Round Reactive to light, Extraocular movements intact,  Conjunctiva without redness, discharge or icterus. Neck/lymp/endocrine: Supple, no lymphadenopathy  Neuro:  Normal gait. PERLA. EOMi. Alert. Oriented x3 Psych: Normal affect, dress and demeanor. Normal speech. Normal thought content and judgment.  No exam data present No results found. No results found for this or any previous visit (from the past 24 hour(s)).  Assessment/Plan: MEILING HENDRIKS is a 64 y.o. 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  Did perform a light ear lavage just to remove hair from external auditory canal-and away from tympanic membrane. Patient was encouraged to try Nasacort in place of Flonase Start Zyrtec nightly Galbreath maneuver demonstrated today. - Ambulatory referral to ENT Procedure: Foreign body/hair external auditory canal Water-peroxide solution was applied and gentle ear lavage performed on right ear(s).  There were no complications.  Tympanic membrane was visualized after lavage.  Tympanic membrane(s) intact.  Auditory canal(s) normal.  Patient tolerated procedure well.      Reviewed  expectations re: course of current medical issues.  Discussed self-management of symptoms.  Outlined signs and symptoms indicating need for more acute intervention.  Patient verbalized understanding and all questions were answered.  Patient received an After-Visit Summary.    Orders Placed This Encounter  Procedures  . Ambulatory referral to ENT   No orders of the defined types were placed in this encounter.   Referral Orders     Ambulatory referral to ENT   Note is dictated utilizing voice recognition software. Although note has been proof read prior to signing, occasional typographical errors still can be missed. If any questions arise, please do not hesitate to call for verification.   electronically signed by:  Howard Pouch, DO  [place]

## 2020-01-10 NOTE — Patient Instructions (Signed)
Start nasacort in place of flonase.  Start zyrtec before bed.  Perform the Galbreath maneuver a few times a day- shown to you today   I have referred you to ENT to further evaluate your ear fullness.    Eustachian Tube Dysfunction  Eustachian tube dysfunction refers to a condition in which a blockage develops in the narrow passage that connects the middle ear to the back of the nose (eustachian tube). The eustachian tube regulates air pressure in the middle ear by letting air move between the ear and nose. It also helps to drain fluid from the middle ear space. Eustachian tube dysfunction can affect one or both ears. When the eustachian tube does not function properly, air pressure, fluid, or both can build up in the middle ear. What are the causes? This condition occurs when the eustachian tube becomes blocked or cannot open normally. Common causes of this condition include:  Ear infections.  Colds and other infections that affect the nose, mouth, and throat (upper respiratory tract).  Allergies.  Irritation from cigarette smoke.  Irritation from stomach acid coming up into the esophagus (gastroesophageal reflux). The esophagus is the tube that carries food from the mouth to the stomach.  Sudden changes in air pressure, such as from descending in an airplane or scuba diving.  Abnormal growths in the nose or throat, such as: ? Growths that line the nose (nasal polyps). ? Abnormal growth of cells (tumors). ? Enlarged tissue at the back of the throat (adenoids). What increases the risk? You are more likely to develop this condition if:  You smoke.  You are overweight.  You are a child who has: ? Certain birth defects of the mouth, such as cleft palate. ? Large tonsils or adenoids. What are the signs or symptoms? Common symptoms of this condition include:  A feeling of fullness in the ear.  Ear pain.  Clicking or popping noises in the ear.  Ringing in the ear.  Hearing  loss.  Loss of balance.  Dizziness. Symptoms may get worse when the air pressure around you changes, such as when you travel to an area of high elevation, fly on an airplane, or go scuba diving. How is this diagnosed? This condition may be diagnosed based on:  Your symptoms.  A physical exam of your ears, nose, and throat.  Tests, such as those that measure: ? The movement of your eardrum (tympanogram). ? Your hearing (audiometry). How is this treated? Treatment depends on the cause and severity of your condition.  In mild cases, you may relieve your symptoms by moving air into your ears. This is called "popping the ears."  In more severe cases, or if you have symptoms of fluid in your ears, treatment may include: ? Medicines to relieve congestion (decongestants). ? Medicines that treat allergies (antihistamines). ? Nasal sprays or ear drops that contain medicines that reduce swelling (steroids). ? A procedure to drain the fluid in your eardrum (myringotomy). In this procedure, a small tube is placed in the eardrum to:  Drain the fluid.  Restore the air in the middle ear space. ? A procedure to insert a balloon device through the nose to inflate the opening of the eustachian tube (balloon dilation). Follow these instructions at home: Lifestyle  Do not do any of the following until your health care provider approves: ? Travel to high altitudes. ? Fly in airplanes. ? Work in a Pension scheme manager or room. ? Scuba dive.  Do not use any products  that contain nicotine or tobacco, such as cigarettes and e-cigarettes. If you need help quitting, ask your health care provider.  Keep your ears dry. Wear fitted earplugs during showering and bathing. Dry your ears completely after. General instructions  Take over-the-counter and prescription medicines only as told by your health care provider.  Use techniques to help pop your ears as recommended by your health care provider. These  may include: ? Chewing gum. ? Yawning. ? Frequent, forceful swallowing. ? Closing your mouth, holding your nose closed, and gently blowing as if you are trying to blow air out of your nose.  Keep all follow-up visits as told by your health care provider. This is important. Contact a health care provider if:  Your symptoms do not go away after treatment.  Your symptoms come back after treatment.  You are unable to pop your ears.  You have: ? A fever. ? Pain in your ear. ? Pain in your head or neck. ? Fluid draining from your ear.  Your hearing suddenly changes.  You become very dizzy.  You lose your balance. Summary  Eustachian tube dysfunction refers to a condition in which a blockage develops in the eustachian tube.  It can be caused by ear infections, allergies, inhaled irritants, or abnormal growths in the nose or throat.  Symptoms include ear pain, hearing loss, or ringing in the ears.  Mild cases are treated with maneuvers to unblock the ears, such as yawning or ear popping.  Severe cases are treated with medicines. Surgery may also be done (rare). This information is not intended to replace advice given to you by your health care provider. Make sure you discuss any questions you have with your health care provider. Document Revised: 10/20/2017 Document Reviewed: 10/20/2017 Elsevier Patient Education  [place].

## 2020-01-17 ENCOUNTER — Ambulatory Visit: Payer: 59 | Admitting: Psychiatry

## 2020-01-24 ENCOUNTER — Ambulatory Visit (INDEPENDENT_AMBULATORY_CARE_PROVIDER_SITE_OTHER): Payer: 59 | Admitting: Family Medicine

## 2020-01-24 ENCOUNTER — Ambulatory Visit: Payer: 59 | Admitting: Psychiatry

## 2020-01-24 ENCOUNTER — Other Ambulatory Visit: Payer: Self-pay

## 2020-01-24 DIAGNOSIS — E538 Deficiency of other specified B group vitamins: Secondary | ICD-10-CM | POA: Diagnosis not present

## 2020-01-24 MED ORDER — CYANOCOBALAMIN 1000 MCG/ML IJ SOLN
1000.0000 ug | Freq: Once | INTRAMUSCULAR | Status: AC
  Administered 2020-01-24: 1000 ug via INTRAMUSCULAR

## 2020-01-24 NOTE — Progress Notes (Signed)
Carol Orlowski Hodgesis a 64 y.o.femalepresents to the office today for B12 injection.  Patient given her 1 month b12.  Patient to schedule in 1 week after.  Didn't see this until after patient had left the appt.  Left message for patient to CB and schedule 1 week f/u w/ provider.   Original order:start B12 injections,please set her up for:B12 injections every 2 weeks for 4 doses, then B12 injection once monthly x1. Follow-up with provider 1 week after last injection.  52mL B12left upper outter quad,Patient tolerated injection. Patient due for follow up labs/provider appt:Yes 1 week after 1 month B12 Injection.  Patient next injection due:to be determined by provider.   [name], CMA  Dr Anitra Lauth, can you please sign off in Dr Lucita Lora absence.  Thanks!

## 2020-01-30 ENCOUNTER — Ambulatory Visit (INDEPENDENT_AMBULATORY_CARE_PROVIDER_SITE_OTHER): Payer: 59 | Admitting: Otolaryngology

## 2020-01-30 ENCOUNTER — Encounter (INDEPENDENT_AMBULATORY_CARE_PROVIDER_SITE_OTHER): Payer: Self-pay | Admitting: Otolaryngology

## 2020-01-30 ENCOUNTER — Other Ambulatory Visit: Payer: Self-pay

## 2020-01-30 VITALS — Temp 97.7°F

## 2020-01-30 DIAGNOSIS — H938X1 Other specified disorders of right ear: Secondary | ICD-10-CM | POA: Diagnosis not present

## 2020-01-30 DIAGNOSIS — H9311 Tinnitus, right ear: Secondary | ICD-10-CM | POA: Diagnosis not present

## 2020-01-30 NOTE — Progress Notes (Signed)
HPI: [name] is a 64 y.o. female who presents is referred by her PCP for evaluation of right ear complaints.  She states that the ear feels full since February of this year around the time she got the vaccine for Covid.  She states the ear occasionally rings and feels "full".  She has been treated for eustachian tube dysfunction..  Past Medical History:  Diagnosis Date  . Allergy   . Anemia    after birth of child 58yrs ago  . Anxiety and depression    with insomnia  . Blood transfusion without reported diagnosis    x3  . Chronic back pain    ? RA, ? Lupus - reports saw rheumatologist in the past but better now, sees Dr. Trenton Gammon  . Eczema   . GERD (gastroesophageal reflux disease)    takes Omeprazole daily, with nausea  . Heart murmur   . Hemorrhoids   . History of kidney stones    passed on her own  . History of migraine    last one a yr ago and takes Zomig prn  . Hot flashes 11/10/2014  . HTN (hypertension) 03/31/2012  . Hx of echocardiogram    Echo (8/15):  EF 55-60%, no RWMA  . IBS (irritable bowel syndrome)    constipation predominant  . Interstitial cystitis    hx of UTI  . Numbness    both legs and related to back  . Osteoporosis    osteopenia  . Palpitations    on metoprolol in past but made BP too low  . Pneumonia    hx of;last time 51yrs ago  . PONV (postoperative nausea and vomiting)    laryngospasm-1999  . Poor vision 10/15/2015   -? Ocular rosacea -sees opthomology   . Raynaud's disease /phenomenon 10/21/2012   no meds, worse in the winter or cold environment.   . Rheumatoid aortitis    uses Diclofenac gel;Rheumatoid  . Seasonal allergies    Flonase daily   . Urinary incontinence    Past Surgical History:  Procedure Laterality Date  . ABDOMINAL HYSTERECTOMY  1997  . BREAST BIOPSY  1999   benign  . COLONOSCOPY  02/11/2013 and 12/24/04   internal and external hemorrhoids  . ENDOMETRIAL ABLATION    . EXCISION MORTON'S NEUROMA Right   . FLEXIBLE  SIGMOIDOSCOPY  03/07/2008   internal and external hemorrhoids  . POSTERIOR LUMBAR FUSION  05/02/2013   L4-L5 fusion (cage/screws/bone graft) Dr. Annette Stable  . TONSILLECTOMY    . UPPER GASTROINTESTINAL ENDOSCOPY  10/23/2010   gastritis, irregular Z-line  . wisdom teeth extracted     Social History   Socioeconomic History  . Marital status: Married    Spouse name: Not on file  . Number of children: Not on file  . Years of education: Not on file  . Highest education level: Not on file  Occupational History  . Occupation: Programmer, multimedia: [employer]  Tobacco Use  . Smoking status: Never Smoker  . Smokeless tobacco: Never Used  Vaping Use  . Vaping Use: Never used  Substance and Sexual Activity  . Alcohol use: Yes    Alcohol/week: 2.0 standard drinks    Types: 1 Shots of liquor, 1 Standard drinks or equivalent per week    Comment: one margarita a week  . Drug use: No  . Sexual activity: Not Currently    Partners: Male  Other Topics Concern  . Not on file  Social History Narrative  Spiritual Beliefs: methodist   Marital status/children/pets: In second marriage.  First husband committed suicide.  2 children.   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 Difficulty of Paying Living Expenses:   Food Insecurity:   . Worried About Charity fundraiser in the Last Year:   . Arboriculturist in the Last Year:   Transportation Needs:   . Film/video editor (Medical):   Marland Kitchen Lack of Transportation (Non-Medical):   Physical Activity:   . Days of Exercise per Week:   . Minutes of Exercise per Session:   Stress:   . Feeling of Stress :   Social Connections:   . Frequency of Communication with Friends and Family:   . Frequency of Social  Gatherings with Friends and Family:   . Attends Religious Services:   . Active Member of Clubs or Organizations:   . Attends Archivist Meetings:   Marland Kitchen Marital Status:    Family History  Problem Relation Age of Onset  . Coronary artery disease Father   . Heart attack Father   . Hypertension Father   . Depression Father   . Hyperlipidemia Father   . Alcohol abuse Father   . Early death Father   . Kidney disease Father   . Mental illness Father   . Diverticulosis Mother   . Hypertension Mother   . Osteoarthritis Mother   . Depression Mother   . Hyperlipidemia Mother   . Kidney disease Mother   . Alcohol abuse Sister   . Depression Sister   . Diabetes Paternal Uncle   . Hypertension Maternal Grandmother   . Stroke Maternal Grandmother   . Hypertension Maternal Grandfather   . Pancreatic cancer Maternal Grandfather   . Early death Maternal Grandfather   . Stroke Paternal Grandfather   . Hyperlipidemia Paternal Grandfather   . Alcohol abuse Paternal Grandfather   . Early death Paternal Grandfather   . Hypertension Paternal Grandfather   . Depression Paternal Grandmother   . Dementia Paternal Grandmother   . Arthritis Paternal Grandmother   . Mental illness Paternal Grandmother   . Alcohol abuse Daughter   . Depression Daughter   . Drug abuse Daughter   . Alcohol abuse Son   . Depression Son   . Colon cancer Neg Hx    Allergies  Allergen Reactions  . Aspartame And Phenylalanine Other (See Comments)    Migraines  . Beef-Derived Products Other (See Comments)    severe cramping  . Citrus Other (See Comments)    Causes Interstitial cystitis flares  . Penicillins Hives    Denies airway involvement Has patient had a PCN reaction causing immediate rash, facial/tongue/throat swelling, SOB or lightheadedness with hypotension: yes hives.  Has patient had a PCN reaction causing severe rash involving mucus membranes or skin necrosis:no Has patient had a PCN reaction  that required hospitalization No Has patient had a PCN reaction occurring within the last 10 years: No If all of the above answers are "NO", then may proceed with Cephalosporin use.   . Promethazine Hcl Other (See Comments)    Muscle  twitching and contracture   . Adhesive [Tape]     Red splotches  . Beta Vulgaris   . Promethazine   . Doxycycline Nausea And Vomiting  . Morphine Sulfate Nausea And Vomiting   Prior to Admission medications   Medication Sig Start Date End Date Taking? Authorizing Provider  ALPRAZolam Duanne Moron) 0.5 MG tablet Take 1 tablet (0.5 mg total) by mouth 3 (three) times daily as needed for anxiety. 11/23/19  Yes Delight Hoh, MD  atenolol (TENORMIN) 25 MG tablet Take 0.5 tablets (12.5 mg total) by mouth daily. 11/02/19  Yes Kuneff, Renee A, DO  cholecalciferol (VITAMIN D) 1000 UNITS tablet Take 1,000 Units by mouth every evening.   Yes [provider]  clobetasol (TEMOVATE) 0.05 % external solution APPLY 1 APPLICATION TOPICALLY DAILY AS NEEDED FOR DERMATITIS 04/27/19  Yes Kuneff, Renee A, DO  estradiol (VIVELLE-DOT) 0.075 MG/24HR Place 1 patch onto the skin 2 (two) times a week. Wednesday and Sunday   Yes [provider]  estradiol (VIVELLE-DOT) 0.1 MG/24HR patch  02/14/19  Yes [provider]  fluorometholone (FML) 0.1 % ophthalmic suspension 1 drop 3 (three) times daily. 06/16/19  Yes [provider]  FLUoxetine (PROZAC) 40 MG capsule Take 1 capsule (40 mg total) by mouth daily after breakfast. 06/15/19  Yes Delight Hoh, MD  fluticasone [facility]) 50 MCG/ACT nasal spray Place 2 sprays into the nose daily. 04/13/13  Yes Dutch Quint B, FNP  lamoTRIgine (LAMICTAL) 200 MG tablet Take 1 tablet (200 mg total) by mouth at bedtime. 11/23/19  Yes Delight Hoh, MD  meloxicam (MOBIC) 15 MG tablet Take 1 tablet (15 mg total) by mouth daily. 06/29/19  Yes Edrick Kins, DPM  Multiple Vitamin (MULTIVITAMIN WITH MINERALS) TABS Take 1 tablet  by mouth every evening.   Yes [provider]  Olopatadine HCl 0.2 % SOLN  04/25/19  Yes [provider]  omeprazole (PRILOSEC) 40 MG capsule Take 1 capsule (40 mg total) by mouth daily before breakfast. 10/13/19  Yes [name], MD  PAZEO 0.7 % SOLN  06/08/18  Yes [provider]  sucralfate (CARAFATE) 1 GM/10ML suspension TAKE 10 MLS BY MOUTH 3 TIMES DAILY AS NEEDED FOR DIGESTION 10/13/19  Yes [name], MD     Positive ROS: Otherwise negative  All other systems have been reviewed and were otherwise negative with the exception of those mentioned in the HPI and as above.  Physical Exam: Constitutional: Alert, well-appearing, no acute distress Ears: External ears without lesions or tenderness. Ear canals are clear bilaterally.  TMs are clear bilaterally with good mobility on pneumatic otoscopy.  No middle ear fluid noted.  On tuning fork testing AC > BC bilaterally. Nasal: External nose without lesions. Clear nasal passages bilaterally. Oral: Lips and gums without lesions. Tongue and palate mucosa without lesions. Posterior oropharynx clear. Neck: No palpable adenopathy or masses.  No TMJ abnormality noted. Respiratory: Breathing comfortably  Skin: No facial/neck lesions or rash noted.  Audiogram in the office today demonstrated a very minimal right ear SNHL in the lower frequencies compared to the left left ear hearing was at 10 dB and right ear hearing was at 20 dB at the 501,000 frequency.  She had type A tympanograms bilaterally.  Remaining upper mid and upper frequencies was symmetric hearing in both ears.  Procedures  Assessment: I suspect the fullness in the right ear is related to a very minimal hearing deficit in the right ear compared to the  left in the lower frequencies. No middle ear abnormality noted on microscopic exam and patient had type A tympanograms bilaterally.  Plan: Reviewed the hearing test with the patient in the office today.  I  am not sure there is a specific treatment for this at this point.  She can use the Nasacort which may help with eustachian tube dysfunction and she has used in the past if this is beneficial. She will notify us if symptoms worsen.   Radene Journey, MD   CC:

## 2020-02-01 NOTE — Progress Notes (Signed)
Pt with vit B12 deficiency.  Agree with vit B12 1000 mcg IM in office today. Signed:  Crissie Sickles, MD           02/01/2020

## 2020-02-03 ENCOUNTER — Encounter (INDEPENDENT_AMBULATORY_CARE_PROVIDER_SITE_OTHER): Payer: Self-pay

## 2020-02-06 MED FILL — SUBVENITE 200 MG TABS: 200 | 90 days supply | Qty: 90 | Fill #0

## 2020-02-06 MED FILL — ALPRAZolam 0.5 MG TABS: 0.5 | 30 days supply | Qty: 90 | Fill #1

## 2020-02-07 ENCOUNTER — Encounter: Payer: Self-pay | Admitting: Family Medicine

## 2020-02-07 ENCOUNTER — Other Ambulatory Visit: Payer: Self-pay

## 2020-02-07 ENCOUNTER — Ambulatory Visit: Payer: 59 | Admitting: Family Medicine

## 2020-02-07 VITALS — BP 110/68 | HR 74 | Temp 97.4°F | Resp 17 | Ht 62.0 in | Wt 117.4 lb

## 2020-02-07 DIAGNOSIS — E538 Deficiency of other specified B group vitamins: Secondary | ICD-10-CM | POA: Diagnosis not present

## 2020-02-07 MED ORDER — MELOXICAM 15 MG PO TABS: 15.0000 mg | ORAL_TABLET | Freq: Every day | ORAL | 5 refills | Status: DC

## 2020-02-07 MED FILL — MELOXICAM 15 MG TABLET: 15 | 30 days supply | Qty: 30 | Fill #0

## 2020-02-07 NOTE — Progress Notes (Signed)
This visit occurred during the SARS-CoV-2 public health emergency.  Safety protocols were in place, including screening questions prior to the visit, additional usage of staff PPE, and extensive cleaning of exam room while observing appropriate contact time as indicated for disinfecting solutions.    [name] , 05-Sep-1955, 64 y.o., female MRN: [id number] Patient Care Team    Relationship Specialty Notifications Start End  Ma Hillock, DO PCP - General Family Medicine  12/29/18   Gavin Pound, MD Consulting Physician Rheumatology  04/09/17   Maisie Fus, MD Consulting Physician Obstetrics and Gynecology  04/09/17   Devra Dopp, MD Referring Physician Dermatology  04/09/17   Group, [place]  12/29/18    Comment: [facility]    12/29/18   [name], MD Consulting Physician Gastroenterology  12/29/18   Earnie Larsson, MD Consulting Physician Neurosurgery  12/29/18     Chief Complaint  Patient presents with  . B12 deficiency     Pt is to recheck B12. Pt is asking refill on Meloxicam today.      Subjective: [name] is 64 y.o. female for follow up on B12. She has seen a positive outcome with injections. Headaches are better, energy is much improved. She is biking- biked 20 miles today.   Prior note:  Pt presents for an OV for follow-up on headache, low B12 and elevated blood pressures.  Patient reports she has started taking high-dose B12 she states is sublingual and around 5000 mcg per dose.  She is taking this every other day.  She has noticed her headaches have improved.  She reports her blood pressures are mildly elevated but seems to be at work only.  She reports there is a lot of stress involved with her job now.  There is changes in management and is creating more job-related stress.  She is prescribed Xanax and Lamictal by her psychiatry team. Prior note: intermittent elevated blood  pressures and more frequent headaches.  Patient states she has noticed mildly elevated blood pressures mostly when she checks it at work but occasionally she will see higher than normal blood pressures at home as well.  The highest she has seen is 150/86 and the lowest 110/68.  She reports for the last 2-3 weeks she has had increased headaches located along the side of her temples.  She also endorses seeing a flashing light in bilateral field of vision.  She reports the flashing lights appear directly in front of her vision.  She feels the headaches seem to only occur when the blood pressure readings are elevated.  Although she is not certain if they are elevated because she has a headache.  She has noticed her blood pressures have been high a few times a week over the last 2-3 weeks.  All symptoms seem to have occurred around the same time.  She denies any fever, chills, nausea, vomit, photophobia, dizziness or worst headache of her life.  She is established with Dr. Velna Hatchet eye appointment 6 months ago and was normal.  Depression screen [facility] 2/9 04/27/2019 12/29/2018 04/20/2018 04/09/2017  Decreased Interest 0 2 0 0  Down, Depressed, Hopeless 0 3 0 0  PHQ - 2 Score 0 5 0 0  Altered sleeping - 2 2 2   Tired, decreased energy - 1 2 0  Change in appetite - 3 0 0  Feeling bad or failure about yourself  - 1 0  0  Trouble concentrating - 2 0 0  Moving slowly or fidgety/restless - 2 0 0  Suicidal thoughts - 1 0 0  PHQ-9 Score - 17 4 2   Difficult doing work/chores - Extremely dIfficult - -  Some recent data might be hidden    Allergies  Allergen Reactions  . Aspartame And Phenylalanine Other (See Comments)    Migraines  . Beef-Derived Products Other (See Comments)    severe cramping  . Citrus Other (See Comments)    Causes Interstitial cystitis flares  . Penicillins Hives    Denies airway involvement Has patient had a PCN reaction causing immediate rash, facial/tongue/throat swelling, SOB or  lightheadedness with hypotension: yes hives.  Has patient had a PCN reaction causing severe rash involving mucus membranes or skin necrosis:no Has patient had a PCN reaction that required hospitalization No Has patient had a PCN reaction occurring within the last 10 years: No If all of the above answers are "NO", then may proceed with Cephalosporin use.   . Promethazine Hcl Other (See Comments)    Muscle twitching and contracture   . Adhesive [Tape]     Red splotches  . Beta Vulgaris   . Promethazine   . Doxycycline Nausea And Vomiting  . Morphine Sulfate Nausea And Vomiting   Social History   Social History Narrative   Spiritual Beliefs: methodist   Marital status/children/pets: In second marriage.  First husband committed suicide.  2 children.   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 Allergy   . Anemia    after birth of child 53yrs ago  . Anxiety and depression    with insomnia  . Blood transfusion without reported diagnosis    x3  . Chronic back pain    ? RA, ? Lupus - reports saw rheumatologist in the past but better now, sees Dr. Trenton Gammon  . Eczema   . GERD (gastroesophageal reflux disease)    takes Omeprazole daily, with nausea  . Heart murmur   . Hemorrhoids   . History of kidney stones    passed on her own  . History of migraine    last one a yr ago and takes Zomig prn  . Hot flashes 11/10/2014  . HTN (hypertension) 03/31/2012  . Hx of echocardiogram    Echo (8/15):  EF 55-60%, no RWMA  . IBS (irritable bowel syndrome)    constipation predominant  . Interstitial cystitis    hx of UTI  . Numbness    both legs and related to back  . Osteoporosis    osteopenia  . Palpitations    on metoprolol in past but made BP too low  .  Pneumonia    hx of;last time 66yrs ago  . PONV (postoperative nausea and vomiting)    laryngospasm-1999  . Poor vision 10/15/2015   -? Ocular rosacea -sees opthomology   . Raynaud's disease /phenomenon 10/21/2012   no meds, worse in the winter or cold environment.   . Rheumatoid aortitis    uses Diclofenac gel;Rheumatoid  . Seasonal allergies    Flonase daily   . Urinary incontinence    Past  Surgical History:  Procedure Laterality Date  . ABDOMINAL HYSTERECTOMY  1997  . BREAST BIOPSY  1999   benign  . COLONOSCOPY  02/11/2013 and 12/24/04   internal and external hemorrhoids  . ENDOMETRIAL ABLATION    . EXCISION MORTON'S NEUROMA Right   . FLEXIBLE SIGMOIDOSCOPY  03/07/2008   internal and external hemorrhoids  . POSTERIOR LUMBAR FUSION  05/02/2013   L4-L5 fusion (cage/screws/bone graft) Dr. Annette Stable  . TONSILLECTOMY    . UPPER GASTROINTESTINAL ENDOSCOPY  10/23/2010   gastritis, irregular Z-line  . wisdom teeth extracted     Family History  Problem Relation Age of Onset  . Coronary artery disease Father   . Heart attack Father   . Hypertension Father   . Depression Father   . Hyperlipidemia Father   . Alcohol abuse Father   . Early death Father   . Kidney disease Father   . Mental illness Father   . Diverticulosis Mother   . Hypertension Mother   . Osteoarthritis Mother   . Depression Mother   . Hyperlipidemia Mother   . Kidney disease Mother   . Alcohol abuse Sister   . Depression Sister   . Diabetes Paternal Uncle   . Hypertension Maternal Grandmother   . Stroke Maternal Grandmother   . Hypertension Maternal Grandfather   . Pancreatic cancer Maternal Grandfather   . Early death Maternal Grandfather   . Stroke Paternal Grandfather   . Hyperlipidemia Paternal Grandfather   . Alcohol abuse Paternal Grandfather   . Early death Paternal Grandfather   . Hypertension Paternal Grandfather   . Depression Paternal Grandmother   . Dementia Paternal Grandmother   . Arthritis  Paternal Grandmother   . Mental illness Paternal Grandmother   . Alcohol abuse Daughter   . Depression Daughter   . Drug abuse Daughter   . Alcohol abuse Son   . Depression Son   . Colon cancer Neg Hx    Allergies as of 02/07/2020      Reactions   Aspartame And Phenylalanine Other (See Comments)   Migraines   Beef-derived Products Other (See Comments)   severe cramping   Citrus Other (See Comments)   Causes Interstitial cystitis flares   Penicillins Hives   Denies airway involvement Has patient had a PCN reaction causing immediate rash, facial/tongue/throat swelling, SOB or lightheadedness with hypotension: yes hives.  Has patient had a PCN reaction causing severe rash involving mucus membranes or skin necrosis:no Has patient had a PCN reaction that required hospitalization No Has patient had a PCN reaction occurring within the last 10 years: No If all of the above answers are "NO", then may proceed with Cephalosporin use.   Promethazine Hcl Other (See Comments)   Muscle twitching and contracture    Adhesive [tape]    Red splotches   Beta Vulgaris    Promethazine    Doxycycline Nausea And Vomiting   Morphine Sulfate Nausea And Vomiting      Medication List       Accurate as of February 07, 2020  2:19 PM. If you have any questions, ask your nurse or doctor.        ALPRAZolam 0.5 MG tablet Commonly known as: XANAX Take 1 tablet (0.5 mg total) by mouth 3 (three) times daily as needed for anxiety.   atenolol 25 MG tablet Commonly known as: TENORMIN Take 0.5 tablets (12.5 mg total) by mouth daily.   cholecalciferol 1000 units tablet Commonly known as: VITAMIN D Take 1,000 Units  by mouth every evening.   clobetasol 0.05 % external solution Commonly known as: TEMOVATE APPLY 1 APPLICATION TOPICALLY DAILY AS NEEDED FOR DERMATITIS   estradiol 0.075 MG/24HR Commonly known as: VIVELLE-DOT Place 1 patch onto the skin 2 (two) times a week. Wednesday and Sunday   estradiol  0.1 MG/24HR patch Commonly known as: VIVELLE-DOT   fluorometholone 0.1 % ophthalmic suspension Commonly known as: FML 1 drop 3 (three) times daily.   FLUoxetine 40 MG capsule Commonly known as: PROZAC Take 1 capsule (40 mg total) by mouth daily after breakfast.   fluticasone 50 MCG/ACT nasal spray Commonly known as: FLONASE Place 2 sprays into the nose daily.   lamoTRIgine 200 MG tablet Commonly known as: LAMICTAL Take 1 tablet (200 mg total) by mouth at bedtime.   meloxicam 15 MG tablet Commonly known as: MOBIC Take 1 tablet (15 mg total) by mouth daily.   multivitamin with minerals Tabs tablet Take 1 tablet by mouth every evening.   omeprazole 40 MG capsule Commonly known as: PRILOSEC Take 1 capsule (40 mg total) by mouth daily before breakfast.   Pazeo 0.7 % Soln Generic drug: Olopatadine HCl   Olopatadine HCl 0.2 % Soln   sucralfate 1 GM/10ML suspension Commonly known as: Carafate TAKE 10 MLS BY MOUTH 3 TIMES DAILY AS NEEDED FOR DIGESTION       All past medical history, surgical history, allergies, family history, immunizations andmedications were updated in the EMR today and reviewed under the history and medication portions of their EMR.     ROS: Negative, with the exception of above mentioned in HPI   Objective:  BP 110/68 (BP Location: Right Arm, Patient Position: Sitting, Cuff Size: Normal)   Pulse 74   Temp (!) 97.4 F (36.3 C) (Temporal)   Resp 17   Ht 5\' 2"  (1.575 m)   Wt 117 lb 6 oz (53.2 kg)   SpO2 98%   BMI 21.47 kg/m  Body mass index is 21.47 kg/m. Gen: Afebrile. No acute distress.  HENT: AT. [place].  Eyes:Pupils Equal Round Reactive to light, Extraocular movements intact,  Conjunctiva without redness, discharge or icterus. CV: RRR  Chest: CTAB, no wheeze or crackles Neuro:  Alert. Oriented x3 Psych: Normal affect, dress and demeanor. Normal speech. Normal thought content and judgment.  No exam data present No results found. No results  found for this or any previous visit (from the past 24 hour(s)).  Assessment/Plan: [name] is a 64 y.o. female present for OV for  b12 deficiency:  Pt reports she is feeling much improved since starting B12.  B12 level collected today.  If levels are improved, will prescribe B12 inj to complete at home q14 vs 30 d intervals.   Refilled her mobic for arthritic pain.    Reviewed expectations re: course of current medical issues.  Discussed self-management of symptoms.  Outlined signs and symptoms indicating need for more acute intervention.  Patient verbalized understanding and all questions were answered.  Patient received an After-Visit Summary.    Orders Placed This Encounter  Procedures  . B12   Meds ordered this encounter  Medications  . meloxicam (MOBIC) 15 MG tablet    Sig: Take 1 tablet (15 mg total) by mouth daily.    Dispense:  30 tablet    Refill:  5   Referral Orders  No referral(s) requested today     Note is dictated utilizing voice recognition software. Although note has been proof read prior to signing, occasional  typographical errors still can be missed. If any questions arise, please do not hesitate to call for verification.   electronically signed by:  Howard Pouch, DO  [place]

## 2020-02-07 NOTE — Patient Instructions (Signed)
Vitamin B12 Deficiency Vitamin B12 deficiency means that your body does not have enough vitamin B12. The body needs this vitamin:  To make red blood cells.  To make genes (DNA).  To help the nerves work. If you do not have enough vitamin B12 in your body, you can have health problems. What are the causes?  Not eating enough foods that contain vitamin B12.  Not being able to absorb vitamin B12 from the food that you eat.  Certain digestive system diseases.  A condition in which the body does not make enough of a certain protein, which results in too few red blood cells (pernicious anemia).  Having a surgery in which part of the stomach or small intestine is removed.  Taking medicines that make it hard for the body to absorb vitamin B12. These medicines include: ? Heartburn medicines. ? Some antibiotic medicines. ? Other medicines that are used to treat certain conditions. What increases the risk?  Being older than age 50.  Eating a vegetarian or vegan diet, especially while you are pregnant.  Eating a poor diet while you are pregnant.  Taking certain medicines.  Having alcoholism. What are the signs or symptoms? In some cases, there are no symptoms. If the condition leads to too few blood cells or nerve damage, symptoms can occur, such as:  Feeling weak.  Feeling tired (fatigued).  Not being hungry.  Weight loss.  A loss of feeling (numbness) or tingling in your hands and feet.  Redness and burning of the tongue.  Being mixed up (confused) or having memory problems.  Sadness (depression).  Problems with your senses. This can include color blindness, ringing in the ears, or loss of taste.  Watery poop (diarrhea) or trouble pooping (constipation).  Trouble walking. If anemia is very bad, symptoms can include:  Being short of breath.  Being dizzy.  Having a very fast heartbeat. How is this treated?  Changing the way you eat and drink, such  as: ? Eating more foods that contain vitamin B12. ? Drinking little or no alcohol.  Getting vitamin B12 shots.  Taking vitamin B12 supplements. Your doctor will tell you the dose that is best for you. Follow these instructions at home: Eating and drinking   Eat lots of healthy foods that contain vitamin B12. These include: ? Meats and poultry, such as beef, pork, chicken, turkey, and organ meats, such as liver. ? Seafood, such as clams, rainbow trout, salmon, tuna, and haddock. ? Eggs. ? Cereal and dairy products that have vitamin B12 added to them. Check the label. The items listed above may not be a complete list of what you can eat and drink. Contact a dietitian for more options. General instructions  Get any shots as told by your doctor.  Take supplements only as told by your doctor.  Do not drink alcohol if your doctor tells you not to. In some cases, you may only be asked to limit alcohol use.  Keep all follow-up visits as told by your doctor. This is important. Contact a doctor if:  Your symptoms come back. Get help right away if:  You have trouble breathing.  You have a very fast heartbeat.  You have chest pain.  You get dizzy.  You pass out. Summary  Vitamin B12 deficiency means that your body is not getting enough vitamin B12.  In some cases, there are no symptoms of this condition.  Treatment may include making a change in the way you eat and drink,   getting vitamin B12 shots, or taking supplements.  Eat lots of healthy foods that contain vitamin B12. This information is not intended to replace advice given to you by your health care provider. Make sure you discuss any questions you have with your health care provider. Document Revised: 03/09/2018 Document Reviewed: 03/09/2018 Elsevier Patient Education  2020 Elsevier Inc.  

## 2020-02-08 ENCOUNTER — Telehealth: Payer: Self-pay | Admitting: Family Medicine

## 2020-02-08 ENCOUNTER — Other Ambulatory Visit (HOSPITAL_COMMUNITY): Payer: Self-pay | Admitting: Family Medicine

## 2020-02-08 LAB — VITAMIN B12: Vitamin B-12: 422 pg/mL (ref 211–911)

## 2020-02-08 MED ORDER — CYANOCOBALAMIN 1000 MCG/ML IJ SOLN: 1000.0000 ug | INTRAMUSCULAR | 11 refills | Status: DC

## 2020-02-08 MED FILL — CYANOCOBALAMIN 1,000 MCG/ML: 1000 | 28 days supply | Qty: 2 | Fill #0

## 2020-02-08 MED FILL — BD 3 ML SYRINGE 25GX1: 25G X 1" | 14 days supply | Qty: 2 | Fill #0

## 2020-02-08 NOTE — Telephone Encounter (Signed)
Please inform patient her B12 did increase to at least 422.  I would encourage her to continue B12 injections every 2 weeks and interval. I have called in the prescription so she can self administer B12 injections.  Please educate patient on proper location and technique for injections.   I did put a pharmacy request to dispense needles.  Please make sure they do not need anything additionally to ensure patient gets proper supplies.  Thanks

## 2020-02-09 NOTE — Telephone Encounter (Signed)
Pt returned call and was given all lab results/information. She verbalized understanding and will call back with any questions or concerns.

## 2020-02-09 NOTE — Telephone Encounter (Signed)
Pt was called and VM was left to return call  °

## 2020-02-10 MED FILL — FLUoxetine HCL 40 MG CAPS: 40 | 90 days supply | Qty: 90 | Fill #1

## 2020-02-20 ENCOUNTER — Telehealth: Payer: Self-pay | Admitting: Internal Medicine

## 2020-02-20 MED ORDER — HYDROCORTISONE (PERIANAL) 2.5 % EX CREA
1.0000 | TOPICAL_CREAM | Freq: Two times a day (BID) | CUTANEOUS | 1 refills | Status: DC
Start: 2020-02-20 — End: 2021-12-05

## 2020-02-20 MED FILL — PROCTOZONE-HC 2.5 % CREA: 2.5 | 15 days supply | Qty: 30 | Fill #0

## 2020-02-20 NOTE — Telephone Encounter (Signed)
40 mile bike ride has resulted in sig ano-rectal pressure c/w hemorrhoids being swollen Asking for Tx  Warm soaks  HC cream Rx

## 2020-03-05 ENCOUNTER — Ambulatory Visit: Payer: 59 | Admitting: Family Medicine

## 2020-03-05 MED FILL — CYANOCOBALAMIN 1,000 MCG/ML: 1000 | 28 days supply | Qty: 2 | Fill #1

## 2020-03-05 MED FILL — ALPRAZolam 0.5 MG TABS: 0.5 | 30 days supply | Qty: 90 | Fill #2

## 2020-03-05 MED FILL — BD 3 ML SYRINGE 25GX1: 25G X 1" | 14 days supply | Qty: 2 | Fill #1

## 2020-03-22 DIAGNOSIS — L570 Actinic keratosis: Secondary | ICD-10-CM | POA: Diagnosis not present

## 2020-03-22 DIAGNOSIS — L219 Seborrheic dermatitis, unspecified: Secondary | ICD-10-CM | POA: Diagnosis not present

## 2020-03-27 MED FILL — CYANOCOBALAMIN 1,000 MCG/ML: 1000 | 28 days supply | Qty: 2 | Fill #2

## 2020-04-03 MED FILL — MELOXICAM 15 MG TABLET: 15 | 30 days supply | Qty: 30 | Fill #1

## 2020-04-03 MED FILL — ATENOLOL 25 MG TABLET: 25 | 90 days supply | Qty: 45 | Fill #1

## 2020-04-18 MED FILL — FLUOROMETHOLONE 0.1% DROPS: 0.1 | 16 days supply | Qty: 5 | Fill #0

## 2020-04-30 ENCOUNTER — Other Ambulatory Visit: Payer: Self-pay | Admitting: Obstetrics & Gynecology

## 2020-04-30 DIAGNOSIS — Z1231 Encounter for screening mammogram for malignant neoplasm of breast: Secondary | ICD-10-CM

## 2020-04-30 MED FILL — CYANOCOBALAMIN 1,000 MCG/ML: 1000 | 28 days supply | Qty: 2 | Fill #3

## 2020-05-01 MED FILL — BD 3 ML SYRINGE 25GX1: 25G X 1" | 14 days supply | Qty: 2 | Fill #2

## 2020-05-03 ENCOUNTER — Encounter: Payer: Self-pay | Admitting: Psychiatry

## 2020-05-07 NOTE — Progress Notes (Signed)
[name] Phone: [phone] Subjective:   [name], am serving as a scribe for Dr. Hulan Saas. This visit occurred during the SARS-CoV-2 public health emergency.  Safety protocols were in place, including screening questions prior to the visit, additional usage of staff PPE, and extensive cleaning of exam room while observing appropriate contact time as indicated for disinfecting solutions.   I'm seeing this patient by the request  of:  Kuneff, Renee A, DO  CC: Right hand and wrist pain  PPI:[id number]  [name] is a 64 y.o. female coming in with complaint of right hand and wrist pain. Patient last seen in 2020 for back pain. Patient states that she has had pain for 2 months. Has tried bracing, NSAIDs and topical analgesics. Pain with keying, cycling and sleeping. Denies any numbness. Pain over thenar eminence.       Past Medical History:  Diagnosis Date  . Allergy   . Anemia    after birth of child 50yrs ago  . Anxiety and depression    with insomnia  . Blood transfusion without reported diagnosis    x3  . Chronic back pain    ? RA, ? Lupus - reports saw rheumatologist in the past but better now, sees Dr. Trenton Gammon  . Eczema   . GERD (gastroesophageal reflux disease)    takes Omeprazole daily, with nausea  . Heart murmur   . Hemorrhoids   . History of kidney stones    passed on her own  . History of migraine    last one a yr ago and takes Zomig prn  . Hot flashes 11/10/2014  . HTN (hypertension) 03/31/2012  . Hx of echocardiogram    Echo (8/15):  EF 55-60%, no RWMA  . IBS (irritable bowel syndrome)    constipation predominant  . Interstitial cystitis    hx of UTI  . Numbness    both legs and related to back  . Osteoporosis    osteopenia  . Palpitations    on metoprolol in past but made BP too low  . Pneumonia    hx of;last time 7yrs ago  . PONV (postoperative nausea and  vomiting)    laryngospasm-1999  . Poor vision 10/15/2015   -? Ocular rosacea -sees opthomology   . Raynaud's disease /phenomenon 10/21/2012   no meds, worse in the winter or cold environment.   . Rheumatoid aortitis    uses Diclofenac gel;Rheumatoid  . Seasonal allergies    Flonase daily   . Urinary incontinence    Past Surgical History:  Procedure Laterality Date  . ABDOMINAL HYSTERECTOMY  1997  . BREAST BIOPSY  1999   benign  . COLONOSCOPY  02/11/2013 and 12/24/04   internal and external hemorrhoids  . ENDOMETRIAL ABLATION    . EXCISION MORTON'S NEUROMA Right   . FLEXIBLE SIGMOIDOSCOPY  03/07/2008   internal and external hemorrhoids  . POSTERIOR LUMBAR FUSION  05/02/2013   L4-L5 fusion (cage/screws/bone graft) Dr. Annette Stable  . TONSILLECTOMY    . UPPER GASTROINTESTINAL ENDOSCOPY  10/23/2010   gastritis, irregular Z-line  . wisdom teeth extracted     Social History   Socioeconomic History  . Marital status: Married    Spouse name: Not on file  . Number of children: Not on file  . Years of education: Not on file  . Highest education level: Not on file  Occupational History  . Occupation: Therapist, sports  Employer: [employer]  Tobacco Use  . Smoking status: Never Smoker  . Smokeless tobacco: Never Used  Vaping Use  . Vaping Use: Never used  Substance and Sexual Activity  . Alcohol use: Yes    Alcohol/week: 2.0 standard drinks    Types: 1 Shots of liquor, 1 Standard drinks or equivalent per week    Comment: one margarita a week  . Drug use: No  . Sexual activity: Not Currently    Partners: Male  Other Topics Concern  . Not on file  Social History Narrative   Spiritual Beliefs: methodist   Marital status/children/pets: In second marriage.  First husband committed suicide.  2 children.   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 Difficulty of Paying Living Expenses: Not on file  Food Insecurity:   . Worried About Charity fundraiser in the Last Year: Not on file  . Ran Out of Food in the Last Year: Not on file  Transportation Needs:   . Lack of Transportation (Medical): Not on file  . Lack of Transportation (Non-Medical): Not on file  Physical Activity:   . Days of Exercise per Week: Not on file  . Minutes of Exercise per Session: Not on file  Stress:   . Feeling of Stress : Not on file  Social Connections:   . Frequency of Communication with Friends and Family: Not on file  . Frequency of Social Gatherings with Friends and Family: Not on file  . Attends Religious Services: Not on file  . Active Member of Clubs or Organizations: Not on file  . Attends Archivist Meetings: Not on file  . Marital Status: Not on file   Allergies  Allergen Reactions  . Aspartame And Phenylalanine Other (See Comments)    Migraines  . Beef-Derived Products Other (See Comments)    severe cramping  . Citrus Other (See Comments)    Causes Interstitial cystitis flares  . Penicillins Hives    Denies airway involvement Has patient had a PCN reaction causing immediate rash, facial/tongue/throat swelling, SOB or lightheadedness with hypotension: yes hives.  Has patient had a PCN reaction causing severe rash involving mucus membranes or skin necrosis:no Has patient had a PCN reaction that required hospitalization No Has patient had a PCN reaction occurring within the last 10 years: No If all of the above answers are "NO", then may proceed with Cephalosporin use.   . Promethazine Hcl Other (See Comments)    Muscle twitching and contracture   . Adhesive [Tape]     Red splotches  . Beta Vulgaris   . Promethazine   . Doxycycline Nausea And Vomiting  . Morphine Sulfate Nausea And Vomiting   Family History  Problem Relation  Age of Onset  . Coronary artery disease Father   . Heart attack Father   . Hypertension Father   . Depression Father   . Hyperlipidemia Father   . Alcohol abuse Father   . Early death Father   . Kidney disease Father   . Mental illness Father   . Diverticulosis Mother   . Hypertension Mother   .  Osteoarthritis Mother   . Depression Mother   . Hyperlipidemia Mother   . Kidney disease Mother   . Alcohol abuse Sister   . Depression Sister   . Diabetes Paternal Uncle   . Hypertension Maternal Grandmother   . Stroke Maternal Grandmother   . Hypertension Maternal Grandfather   . Pancreatic cancer Maternal Grandfather   . Early death Maternal Grandfather   . Stroke Paternal Grandfather   . Hyperlipidemia Paternal Grandfather   . Alcohol abuse Paternal Grandfather   . Early death Paternal Grandfather   . Hypertension Paternal Grandfather   . Depression Paternal Grandmother   . Dementia Paternal Grandmother   . Arthritis Paternal Grandmother   . Mental illness Paternal Grandmother   . Alcohol abuse Daughter   . Depression Daughter   . Drug abuse Daughter   . Alcohol abuse Son   . Depression Son   . Colon cancer Neg Hx     Current Outpatient Medications (Endocrine & Metabolic):  .  estradiol (VIVELLE-DOT) 0.075 MG/24HR, Place 1 patch onto the skin 2 (two) times a week. Wednesday and Sunday .  estradiol (VIVELLE-DOT) 0.1 MG/24HR patch,   Current Outpatient Medications (Cardiovascular):  .  atenolol (TENORMIN) 25 MG tablet, Take 0.5 tablets (12.5 mg total) by mouth daily.  Current Outpatient Medications (Respiratory):  .  fluticasone (FLONASE) 50 MCG/ACT nasal spray, Place 2 sprays into the nose daily.  Current Outpatient Medications (Analgesics):  .  meloxicam (MOBIC) 15 MG tablet, Take 1 tablet (15 mg total) by mouth daily.  Current Outpatient Medications (Hematological):  .  cyanocobalamin (,VITAMIN B-12,) 1000 MCG/ML injection, Inject 1 mL (1,000 mcg total) into the  muscle every 14 (fourteen) days.  Current Outpatient Medications (Other):  Marland Kitchen  ALPRAZolam (XANAX) 0.5 MG tablet, Take 1 tablet (0.5 mg total) by mouth 3 (three) times daily as needed for anxiety. .  cholecalciferol (VITAMIN D) 1000 UNITS tablet, Take 1,000 Units by mouth every evening. .  clobetasol (TEMOVATE) 0.05 % external solution, APPLY 1 APPLICATION TOPICALLY DAILY AS NEEDED FOR DERMATITIS .  fluorometholone (FML) 0.1 % ophthalmic suspension, 1 drop 3 (three) times daily. Marland Kitchen  FLUoxetine (PROZAC) 40 MG capsule, Take 1 capsule (40 mg total) by mouth daily after breakfast. .  hydrocortisone (ANUSOL-HC) 2.5 % rectal cream, Place 1 application rectally 2 (two) times daily. Marland Kitchen  lamoTRIgine (LAMICTAL) 200 MG tablet, Take 1 tablet (200 mg total) by mouth at bedtime. .  Multiple Vitamin (MULTIVITAMIN WITH MINERALS) TABS, Take 1 tablet by mouth every evening. .  Olopatadine HCl 0.2 % SOLN,  .  omeprazole (PRILOSEC) 40 MG capsule, Take 1 capsule (40 mg total) by mouth daily before breakfast. .  PAZEO 0.7 % SOLN,  .  sucralfate (CARAFATE) 1 GM/10ML suspension, TAKE 10 MLS BY MOUTH 3 TIMES DAILY AS NEEDED FOR DIGESTION   Reviewed prior external information including notes and imaging from  primary care provider As well as notes that were available from care everywhere and other healthcare systems.  Past medical history, social, surgical and family history all reviewed in electronic medical record.  No pertanent information unless stated regarding to the chief complaint.   Review of Systems:  No headache, visual changes, nausea, vomiting, diarrhea, constipation, dizziness, abdominal pain, skin rash, fevers, chills, night sweats, weight loss, swollen lymph nodes, body aches, joint swelling, chest pain, shortness of breath, mood changes. POSITIVE muscle aches  Objective  Blood pressure 110/78, pulse 70, height 5\' 2"  (1.575 m), weight 117 lb (53.1 kg), SpO2  99 %.   General: No apparent distress alert  and oriented x3 mood and affect normal, dressed appropriately.  HEENT: Pupils equal, extraocular movements intact  Respiratory: Patient's speak in full sentences and does not appear short of breath  Gait normal with good balance and coordination.  MSK: Right hand exam shows the patient does have some very mild atrophy of the thenar eminence but does have an effusion of the [facility] joint.  Positive grind test.  Negative Finkelstein's.  Negative Tinel's.  Limited musculoskeletal ultrasound was performed and interpreted by Lyndal Pulley  Limited ultrasound of patient's right [facility] joint shows moderate to severe arthritic changes with a large effusion noted.  Increasing in Doppler flow surrounding the joint space.  Impression: CMC arthritis with effusion   Impression and Recommendations:     The above documentation has been reviewed and is accurate and complete Lyndal Pulley, DO

## 2020-05-08 ENCOUNTER — Other Ambulatory Visit: Payer: Self-pay

## 2020-05-08 ENCOUNTER — Ambulatory Visit: Payer: 59 | Admitting: Family Medicine

## 2020-05-08 ENCOUNTER — Ambulatory Visit: Payer: Self-pay

## 2020-05-08 ENCOUNTER — Encounter: Payer: Self-pay | Admitting: Family Medicine

## 2020-05-08 ENCOUNTER — Ambulatory Visit (INDEPENDENT_AMBULATORY_CARE_PROVIDER_SITE_OTHER): Payer: 59

## 2020-05-08 VITALS — BP 110/78 | HR 70 | Ht 62.0 in | Wt 117.0 lb

## 2020-05-08 DIAGNOSIS — M79644 Pain in right finger(s): Secondary | ICD-10-CM

## 2020-05-08 DIAGNOSIS — M1811 Unilateral primary osteoarthritis of first carpometacarpal joint, right hand: Secondary | ICD-10-CM

## 2020-05-08 NOTE — Patient Instructions (Signed)
CMC arthritis EXOS brace [place] Ice See me in 4-6 weeks and can consider injection

## 2020-05-08 NOTE — Assessment & Plan Note (Addendum)
Moderate to severe noted on ultrasound and x-ray pending., brace given today for some relief.  Discussed icing regimen and home exercises.  Discussed topical anti-inflammatories.  Worsening pain will consider injection, formal physical therapy, PRP or potential surgical intervention.  Follow-up with me again 4 to 6 weeks

## 2020-05-16 ENCOUNTER — Telehealth (INDEPENDENT_AMBULATORY_CARE_PROVIDER_SITE_OTHER): Payer: 59 | Admitting: Family Medicine

## 2020-05-16 ENCOUNTER — Encounter: Payer: Self-pay | Admitting: Family Medicine

## 2020-05-16 ENCOUNTER — Other Ambulatory Visit: Payer: Self-pay | Admitting: Family Medicine

## 2020-05-16 ENCOUNTER — Other Ambulatory Visit: Payer: Self-pay

## 2020-05-16 VITALS — Temp 97.5°F

## 2020-05-16 DIAGNOSIS — M1811 Unilateral primary osteoarthritis of first carpometacarpal joint, right hand: Secondary | ICD-10-CM | POA: Diagnosis not present

## 2020-05-16 DIAGNOSIS — H1032 Unspecified acute conjunctivitis, left eye: Secondary | ICD-10-CM

## 2020-05-16 DIAGNOSIS — M79644 Pain in right finger(s): Secondary | ICD-10-CM | POA: Diagnosis not present

## 2020-05-16 MED ORDER — POLYMYXIN B-TRIMETHOPRIM 10000-0.1 UNIT/ML-% OP SOLN: 1.0000 [drp] | Freq: Four times a day (QID) | OPHTHALMIC | 0 refills | Status: DC

## 2020-05-16 MED FILL — POLYMYXIN B/TMP EYE DROPS: 10000-0.1 | 10 days supply | Qty: 10 | Fill #0

## 2020-05-16 NOTE — Progress Notes (Signed)
Chief Complaint  Patient presents with  . Eye Drainage    left eye    [name] is here for left eye irritation. Due to COVID-19 pandemic, we are interacting via web portal for an electronic face-to-face visit. I verified patient's ID using 2 identifiers. Patient agreed to proceed with visit via this method. Patient is at work, I am at office. Patient and I are present for visit.   Duration: 1 d  No itching, some irritation near inner eye, +crusting and clear drainage Chemical exposure? No  Recent URI? No  Contact lenses? No  History of allergies? No  Treatment to date: artificial tears, steroid drops  Past Medical History:  Diagnosis Date  . Allergy   . Anemia    after birth of child 98yrs ago  . Anxiety and depression    with insomnia  . Blood transfusion without reported diagnosis    x3  . Chronic back pain    ? RA, ? Lupus - reports saw rheumatologist in the past but better now, sees Dr. Trenton Gammon  . Eczema   . GERD (gastroesophageal reflux disease)    takes Omeprazole daily, with nausea  . Heart murmur   . Hemorrhoids   . History of kidney stones    passed on her own  . History of migraine    last one a yr ago and takes Zomig prn  . Hot flashes 11/10/2014  . HTN (hypertension) 03/31/2012  . Hx of echocardiogram    Echo (8/15):  EF 55-60%, no RWMA  . IBS (irritable bowel syndrome)    constipation predominant  . Interstitial cystitis    hx of UTI  . Numbness    both legs and related to back  . Osteoporosis    osteopenia  . Palpitations    on metoprolol in past but made BP too low  . Pneumonia    hx of;last time 67yrs ago  . PONV (postoperative nausea and vomiting)    laryngospasm-1999  . Poor vision 10/15/2015   -? Ocular rosacea -sees opthomology   . Raynaud's disease /phenomenon 10/21/2012   no meds, worse in the winter or cold environment.   . Rheumatoid aortitis    uses Diclofenac gel;Rheumatoid  . Seasonal allergies    Flonase daily   . Urinary  incontinence    Family History  Problem Relation Age of Onset  . Coronary artery disease Father   . Heart attack Father   . Hypertension Father   . Depression Father   . Hyperlipidemia Father   . Alcohol abuse Father   . Early death Father   . Kidney disease Father   . Mental illness Father   . Diverticulosis Mother   . Hypertension Mother   . Osteoarthritis Mother   . Depression Mother   . Hyperlipidemia Mother   . Kidney disease Mother   . Alcohol abuse Sister   . Depression Sister   . Diabetes Paternal Uncle   . Hypertension Maternal Grandmother   . Stroke Maternal Grandmother   . Hypertension Maternal Grandfather   . Pancreatic cancer Maternal Grandfather   . Early death Maternal Grandfather   . Stroke Paternal Grandfather   . Hyperlipidemia Paternal Grandfather   . Alcohol abuse Paternal Grandfather   . Early death Paternal Grandfather   . Hypertension Paternal Grandfather   . Depression Paternal Grandmother   . Dementia Paternal Grandmother   . Arthritis Paternal Grandmother   . Mental illness Paternal Grandmother   .  Alcohol abuse Daughter   . Depression Daughter   . Drug abuse Daughter   . Alcohol abuse Son   . Depression Son   . Colon cancer Neg Hx     Temp (!) 97.5 F (36.4 C) (Oral)  No conversational dyspnea Age appropriate judgment and insight Nml affect and mood  Acute conjunctivitis of left eye, unspecified acute conjunctivitis type - Plan: trimethoprim-polymyxin b (POLYTRIM) ophthalmic solution  q 6 hrs for 7-10 d. I had trouble seeing her eye unfortunately.  Instructed to practice good hand hygiene and try not to touch face. Warm compresses and artificial tears also recommended. F/u if no improvement in 7-10 days. Pt voiced understanding and agreement to the plan.  [place], DO 05/16/20 3:50 PM

## 2020-05-17 ENCOUNTER — Telehealth: Payer: Self-pay

## 2020-05-17 NOTE — Telephone Encounter (Signed)
Patietn called stating R thumb splint she was given yesterday is too small for her thumb knuckle and it hurts worse to wear.

## 2020-05-17 NOTE — Telephone Encounter (Signed)
Patient called and would like to try small EXOS as the XS is too tight on her thumb. Patient scheduled with [facility], 05/18/2020, at 11:00am.

## 2020-05-18 NOTE — Telephone Encounter (Signed)
Fitted Vinnie Level w/ the small Exos short thumb spica.  It was better in terms of comfort but she might still have an issue especially in her webspace area.  Gave her some options of things she can try throughout the weekend if she con't to c/o tightness, especially through the webspace.  Also advised pt that she can return to clinic again if needed to refit.

## 2020-05-21 ENCOUNTER — Other Ambulatory Visit: Payer: Self-pay | Admitting: Psychiatry

## 2020-05-21 ENCOUNTER — Other Ambulatory Visit: Payer: Self-pay

## 2020-05-21 ENCOUNTER — Ambulatory Visit (INDEPENDENT_AMBULATORY_CARE_PROVIDER_SITE_OTHER): Payer: 59 | Admitting: Psychiatry

## 2020-05-21 ENCOUNTER — Encounter: Payer: Self-pay | Admitting: Psychiatry

## 2020-05-21 VITALS — Ht 62.0 in | Wt 119.0 lb

## 2020-05-21 DIAGNOSIS — F4310 Post-traumatic stress disorder, unspecified: Secondary | ICD-10-CM

## 2020-05-21 DIAGNOSIS — F410 Panic disorder [episodic paroxysmal anxiety] without agoraphobia: Secondary | ICD-10-CM | POA: Diagnosis not present

## 2020-05-21 DIAGNOSIS — F3341 Major depressive disorder, recurrent, in partial remission: Secondary | ICD-10-CM

## 2020-05-21 MED ORDER — ALPRAZOLAM 0.5 MG PO TABS: 0.5000 mg | ORAL_TABLET | Freq: Three times a day (TID) | ORAL | 1 refills | Status: DC | PRN

## 2020-05-21 MED ORDER — LAMOTRIGINE 200 MG PO TABS: 200.0000 mg | ORAL_TABLET | Freq: Every day | ORAL | 1 refills | Status: DC

## 2020-05-21 MED ORDER — FLUOXETINE HCL 40 MG PO CAPS: 40.0000 mg | ORAL_CAPSULE | Freq: Every day | ORAL | 1 refills | Status: DC

## 2020-05-21 MED FILL — SUBVENITE 200 MG TABS: 200 | 90 days supply | Qty: 90 | Fill #0

## 2020-05-21 MED FILL — FLUoxetine HCL 40 MG CAPS: 40 | 90 days supply | Qty: 90 | Fill #0

## 2020-05-21 MED FILL — ALPRAZolam 0.5 MG TABS: 0.5 | 90 days supply | Qty: 270 | Fill #0
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
## 2020-05-22 ENCOUNTER — Other Ambulatory Visit: Payer: Self-pay

## 2020-05-23 ENCOUNTER — Ambulatory Visit: Payer: 59 | Admitting: Family Medicine

## 2020-05-23 ENCOUNTER — Encounter: Payer: Self-pay | Admitting: Family Medicine

## 2020-05-23 ENCOUNTER — Ambulatory Visit: Payer: 59 | Admitting: Psychiatry

## 2020-05-23 ENCOUNTER — Other Ambulatory Visit: Payer: Self-pay | Admitting: Family Medicine

## 2020-05-23 VITALS — BP 114/70 | HR 63 | Temp 98.6°F | Ht 62.0 in | Wt 119.0 lb

## 2020-05-23 DIAGNOSIS — L309 Dermatitis, unspecified: Secondary | ICD-10-CM

## 2020-05-23 DIAGNOSIS — R002 Palpitations: Secondary | ICD-10-CM | POA: Diagnosis not present

## 2020-05-23 DIAGNOSIS — Z130 Encounter for screening for diseases of the blood and blood-forming organs and certain disorders involving the immune mechanism: Secondary | ICD-10-CM

## 2020-05-23 DIAGNOSIS — Z0000 Encounter for general adult medical examination without abnormal findings: Secondary | ICD-10-CM

## 2020-05-23 DIAGNOSIS — Z13220 Encounter for screening for lipoid disorders: Secondary | ICD-10-CM

## 2020-05-23 DIAGNOSIS — E538 Deficiency of other specified B group vitamins: Secondary | ICD-10-CM | POA: Diagnosis not present

## 2020-05-23 DIAGNOSIS — Z131 Encounter for screening for diabetes mellitus: Secondary | ICD-10-CM

## 2020-05-23 DIAGNOSIS — Z23 Encounter for immunization: Secondary | ICD-10-CM

## 2020-05-23 LAB — LIPID PANEL
Cholesterol: 202 mg/dL — ABNORMAL HIGH (ref 0–200)
HDL: 78.4 mg/dL (ref >39.00)
LDL Cholesterol: 108 mg/dL — ABNORMAL HIGH (ref 0–99)
NonHDL: 123.46
Total CHOL/HDL Ratio: 3
Triglycerides: 78 mg/dL (ref 0.0–149.0)
VLDL: 15.6 mg/dL (ref 0.0–40.0)

## 2020-05-23 LAB — COMPREHENSIVE METABOLIC PANEL
ALT: 14 U/L (ref 0–35)
AST: 21 U/L (ref 0–37)
Albumin: 4.4 g/dL (ref 3.5–5.2)
Alkaline Phosphatase: 70 U/L (ref 39–117)
BUN: 14 mg/dL (ref 6–23)
CO2: 33 mEq/L — ABNORMAL HIGH (ref 19–32)
Calcium: 9.4 mg/dL (ref 8.4–10.5)
Chloride: 102 mEq/L (ref 96–112)
Creatinine, Ser: 1.04 mg/dL (ref 0.40–1.20)
GFR: 57.09 mL/min — ABNORMAL LOW (ref >60.00)
Glucose, Bld: 81 mg/dL (ref 70–99)
Potassium: 5.4 mEq/L — ABNORMAL HIGH (ref 3.5–5.1)
Sodium: 139 mEq/L (ref 135–145)
Total Bilirubin: 0.5 mg/dL (ref 0.2–1.2)
Total Protein: 6.4 g/dL (ref 6.0–8.3)

## 2020-05-23 LAB — CBC
HCT: 42.8 % (ref 36.0–46.0)
Hemoglobin: 14 g/dL (ref 12.0–15.0)
MCHC: 32.6 g/dL (ref 30.0–36.0)
MCV: 93.1 fl (ref 78.0–100.0)
Platelets: 181 10*3/uL (ref 150.0–400.0)
RBC: 4.6 Mil/uL (ref 3.87–5.11)
RDW: 13.7 % (ref 11.5–15.5)
WBC: 4 10*3/uL (ref 4.0–10.5)

## 2020-05-23 LAB — HEMOGLOBIN A1C: Hgb A1c MFr Bld: 6.1 % (ref 4.6–6.5)

## 2020-05-23 LAB — TSH: TSH: 1.94 u[IU]/mL (ref 0.35–4.50)

## 2020-05-23 LAB — VITAMIN B12: Vitamin B-12: 558 pg/mL (ref 211–911)

## 2020-05-23 MED ORDER — MELOXICAM 15 MG PO TABS
15.0000 mg | ORAL_TABLET | Freq: Every day | ORAL | 5 refills | Status: DC
Start: 2020-05-23 — End: 2020-05-23

## 2020-05-23 MED ORDER — CLOBETASOL PROPIONATE 0.05 % EX SOLN
CUTANEOUS | 2 refills | Status: DC
Start: 2020-05-23 — End: 2020-05-23

## 2020-05-23 MED ORDER — ATENOLOL 25 MG PO TABS
12.5000 mg | ORAL_TABLET | Freq: Every day | ORAL | 1 refills | Status: DC
Start: 2020-05-23 — End: 2020-05-23

## 2020-05-23 MED FILL — CLOBETASOL 0.05% SOLUTION: 0.05 | 30 days supply | Qty: 50 | Fill #0

## 2020-05-23 MED FILL — MELOXICAM 15 MG TABLET: 15 | 30 days supply | Qty: 30 | Fill #0

## 2020-05-23 MED FILL — ESTRADIOL 0.1 MG/GM CREA: 0.1 | 90 days supply | Qty: 43 | Fill #1

## 2020-05-23 NOTE — Patient Instructions (Signed)
Health Maintenance, Female Adopting a healthy lifestyle and getting preventive care are important in promoting health and wellness. Ask your health care provider about:  The right schedule for you to have regular tests and exams.  Things you can do on your own to prevent diseases and keep yourself healthy. What should I know about diet, weight, and exercise? Eat a healthy diet   Eat a diet that includes plenty of vegetables, fruits, low-fat dairy products, and lean protein.  Do not eat a lot of foods that are high in solid fats, added sugars, or sodium. Maintain a healthy weight Body mass index (BMI) is used to identify weight problems. It estimates body fat based on height and weight. Your health care provider can help determine your BMI and help you achieve or maintain a healthy weight. Get regular exercise Get regular exercise. This is one of the most important things you can do for your health. Most adults should:  Exercise for at least 150 minutes each week. The exercise should increase your heart rate and make you sweat (moderate-intensity exercise).  Do strengthening exercises at least twice a week. This is in addition to the moderate-intensity exercise.  Spend less time sitting. Even light physical activity can be beneficial. Watch cholesterol and blood lipids Have your blood tested for lipids and cholesterol at 63 years of age, then have this test every 5 years. Have your cholesterol levels checked more often if:  Your lipid or cholesterol levels are high.  You are older than 63 years of age.  You are at high risk for heart disease. What should I know about cancer screening? Depending on your health history and family history, you may need to have cancer screening at various ages. This may include screening for:  Breast cancer.  Cervical cancer.  Colorectal cancer.  Skin cancer.  Lung cancer. What should I know about heart disease, diabetes, and high blood  pressure? Blood pressure and heart disease  High blood pressure causes heart disease and increases the risk of stroke. This is more likely to develop in people who have high blood pressure readings, are of African descent, or are overweight.  Have your blood pressure checked: ? Every 3-5 years if you are 18-39 years of age. ? Every year if you are 40 years old or older. Diabetes Have regular diabetes screenings. This checks your fasting blood sugar level. Have the screening done:  Once every three years after age 40 if you are at a normal weight and have a low risk for diabetes.  More often and at a younger age if you are overweight or have a high risk for diabetes. What should I know about preventing infection? Hepatitis B If you have a higher risk for hepatitis B, you should be screened for this virus. Talk with your health care provider to find out if you are at risk for hepatitis B infection. Hepatitis C Testing is recommended for:  Everyone born from 1945 through 1965.  Anyone with known risk factors for hepatitis C. Sexually transmitted infections (STIs)  Get screened for STIs, including gonorrhea and chlamydia, if: ? You are sexually active and are younger than 63 years of age. ? You are older than 63 years of age and your health care provider tells you that you are at risk for this type of infection. ? Your sexual activity has changed since you were last screened, and you are at increased risk for chlamydia or gonorrhea. Ask your health care provider if   you are at risk.  Ask your health care provider about whether you are at high risk for HIV. Your health care provider may recommend a prescription medicine to help prevent HIV infection. If you choose to take medicine to prevent HIV, you should first get tested for HIV. You should then be tested every 3 months for as long as you are taking the medicine. Pregnancy  If you are about to stop having your period (premenopausal) and  you may become pregnant, seek counseling before you get pregnant.  Take 400 to 800 micrograms (mcg) of folic acid every day if you become pregnant.  Ask for birth control (contraception) if you want to prevent pregnancy. Osteoporosis and menopause Osteoporosis is a disease in which the bones lose minerals and strength with aging. This can result in bone fractures. If you are 65 years old or older, or if you are at risk for osteoporosis and fractures, ask your health care provider if you should:  Be screened for bone loss.  Take a calcium or vitamin D supplement to lower your risk of fractures.  Be given hormone replacement therapy (HRT) to treat symptoms of menopause. Follow these instructions at home: Lifestyle  Do not use any products that contain nicotine or tobacco, such as cigarettes, e-cigarettes, and chewing tobacco. If you need help quitting, ask your health care provider.  Do not use street drugs.  Do not share needles.  Ask your health care provider for help if you need support or information about quitting drugs. Alcohol use  Do not drink alcohol if: ? Your health care provider tells you not to drink. ? You are pregnant, may be pregnant, or are planning to become pregnant.  If you drink alcohol: ? Limit how much you use to 0-1 drink a day. ? Limit intake if you are breastfeeding.  Be aware of how much alcohol is in your drink. In the [place] one drink equals one 12 oz bottle of beer (355 mL), one 5 oz glass of wine (148 mL), or one 1 oz glass of hard liquor (44 mL). General instructions  Schedule regular health, dental, and eye exams.  Stay current with your vaccines.  Tell your health care provider if: ? You often feel depressed. ? You have ever been abused or do not feel safe at home. Summary  Adopting a healthy lifestyle and getting preventive care are important in promoting health and wellness.  Follow your health care provider's instructions about healthy  diet, exercising, and getting tested or screened for diseases.  Follow your health care provider's instructions on monitoring your cholesterol and blood pressure. This information is not intended to replace advice given to you by your health care provider. Make sure you discuss any questions you have with your health care provider. Document Revised: 06/23/2018 Document Reviewed: 06/23/2018 [employer] Patient Education  2020 [employer]  

## 2020-05-23 NOTE — Progress Notes (Signed)
This visit occurred during the SARS-CoV-2 public health emergency.  Safety protocols were in place, including screening questions prior to the visit, additional usage of staff PPE, and extensive cleaning of exam room while observing appropriate contact time as indicated for disinfecting solutions.    Patient ID: [name], female  DOB: 10/16/1955, 64 y.o.   MRN: [id number] Patient Care Team    Relationship Specialty Notifications Start End  Ma Hillock, DO PCP - General Family Medicine  12/29/18   Gavin Pound, MD Consulting Physician Rheumatology  04/09/17   Maisie Fus, MD Consulting Physician Obstetrics and Gynecology  04/09/17   Devra Dopp, MD Referring Physician Dermatology  04/09/17   Group, [place]  12/29/18    Comment: [facility]    12/29/18   [name], MD Consulting Physician Gastroenterology  12/29/18   Earnie Larsson, MD Consulting Physician Neurosurgery  12/29/18     Chief Complaint  Patient presents with  . Annual Exam    pt is fasting    Subjective:  [name] is a 64 y.o.  Female  present for CPE. All past medical history, surgical history, allergies, family history, immunizations, medications and social history were updated in the electronic medical record today. All recent labs, ED visits and hospitalizations within the last year were reviewed.  Health maintenance:  Colonoscopy: completed 2014, 10 yr follow up [name]. Mammogram: scheduled 06/20/2020 Cervical cancer screening: last pap: 2017- Dr. Nori Riis -gyn Immunizations: tdap UTD 2015, Influenza UTD 10/21 (encouraged yearly),shingrix completed, covid series completed Infectious disease screening: Hep C completed DEXA: last completed 2020 at gyn Assistive device: none Oxygen ZHY:QMVH Patient has a Dental home. Hospitalizations/ED visits: reviewed  Depression screen [facility] 2/9 05/23/2020 04/27/2019 12/29/2018  04/20/2018 04/09/2017  Decreased Interest 0 0 2 0 0  Down, Depressed, Hopeless 0 0 3 0 0  PHQ - 2 Score 0 0 5 0 0  Altered sleeping - - 2 2 2   Tired, decreased energy - - 1 2 0  Change in appetite - - 3 0 0  Feeling bad or failure about yourself  - - 1 0 0  Trouble concentrating - - 2 0 0  Moving slowly or fidgety/restless - - 2 0 0  Suicidal thoughts - - 1 0 0  PHQ-9 Score - - 17 4 2   Difficult doing work/chores - - Extremely dIfficult - -  Some recent data might be hidden   GAD 7 : Generalized Anxiety Score 04/27/2019 12/29/2018  Nervous, Anxious, on Edge 1 3  Control/stop worrying 2 3  Worry too much - different things 2 3  Trouble relaxing 1 2  Restless 1 2  Easily annoyed or irritable 2 3  Afraid - awful might happen 2 2  Total GAD 7 Score 11 18  Anxiety Difficulty Somewhat difficult Extremely difficult    Immunization History  Administered Date(s) Administered  . Influenza,inj,Quad PF,6+ Mos 04/12/2014  . Influenza-Unspecified 05/15/2015, 03/28/2018, 04/20/2020  . PFIZER SARS-COV-2 Vaccination 08/04/2019, 08/23/2019  . Td 07/15/2003  . Zoster Recombinat (Shingrix) 04/27/2019, 09/26/2019     Past Medical History:  Diagnosis Date  . Allergy   . Anemia    after birth of child 60yrs ago  . Anxiety and depression    with insomnia  . Blood transfusion without reported diagnosis    x3  . Chronic back pain    ? RA, ? Lupus - reports saw rheumatologist  in the past but better now, sees Dr. Trenton Gammon  . Eczema   . GERD (gastroesophageal reflux disease)    takes Omeprazole daily, with nausea  . Heart murmur   . Hemorrhoids   . History of kidney stones    passed on her own  . History of migraine    last one a yr ago and takes Zomig prn  . Hot flashes 11/10/2014  . HTN (hypertension) 03/31/2012  . Hx of echocardiogram    Echo (8/15):  EF 55-60%, no RWMA  . IBS (irritable bowel syndrome)    constipation predominant  . Interstitial cystitis    hx of UTI  . Numbness     both legs and related to back  . Osteoporosis    osteopenia  . Palpitations    on metoprolol in past but made BP too low  . Pneumonia    hx of;last time 38yrs ago  . PONV (postoperative nausea and vomiting)    laryngospasm-1999  . Poor vision 10/15/2015   -? Ocular rosacea -sees opthomology   . Raynaud's disease /phenomenon 10/21/2012   no meds, worse in the winter or cold environment.   . Rheumatoid aortitis    uses Diclofenac gel;Rheumatoid  . Seasonal allergies    Flonase daily   . Urinary incontinence    Allergies  Allergen Reactions  . Aspartame And Phenylalanine Other (See Comments)    Migraines  . Beef-Derived Products Other (See Comments)    severe cramping  . Citrus Other (See Comments)    Causes Interstitial cystitis flares  . Penicillins Hives    Denies airway involvement Has patient had a PCN reaction causing immediate rash, facial/tongue/throat swelling, SOB or lightheadedness with hypotension: yes hives.  Has patient had a PCN reaction causing severe rash involving mucus membranes or skin necrosis:no Has patient had a PCN reaction that required hospitalization No Has patient had a PCN reaction occurring within the last 10 years: No If all of the above answers are "NO", then may proceed with Cephalosporin use.   . Promethazine Hcl Other (See Comments)    Muscle twitching and contracture   . Adhesive [Tape]     Red splotches  . Beta Vulgaris   . Promethazine   . Doxycycline Nausea And Vomiting  . Morphine Sulfate Nausea And Vomiting   Past Surgical History:  Procedure Laterality Date  . ABDOMINAL HYSTERECTOMY  1997  . BREAST BIOPSY  1999   benign  . COLONOSCOPY  02/11/2013 and 12/24/04   internal and external hemorrhoids  . ENDOMETRIAL ABLATION    . EXCISION MORTON'S NEUROMA Right   . FLEXIBLE SIGMOIDOSCOPY  03/07/2008   internal and external hemorrhoids  . POSTERIOR LUMBAR FUSION  05/02/2013   L4-L5 fusion (cage/screws/bone graft) Dr. Annette Stable  .  TONSILLECTOMY    . UPPER GASTROINTESTINAL ENDOSCOPY  10/23/2010   gastritis, irregular Z-line  . wisdom teeth extracted     Family History  Problem Relation Age of Onset  . Coronary artery disease Father   . Heart attack Father   . Hypertension Father   . Depression Father   . Hyperlipidemia Father   . Alcohol abuse Father   . Early death Father   . Kidney disease Father   . Mental illness Father   . Diverticulosis Mother   . Hypertension Mother   . Osteoarthritis Mother   . Depression Mother   . Hyperlipidemia Mother   . Kidney disease Mother   . Alcohol abuse Sister   .  Depression Sister   . Diabetes Paternal Uncle   . Hypertension Maternal Grandmother   . Stroke Maternal Grandmother   . Hypertension Maternal Grandfather   . Pancreatic cancer Maternal Grandfather   . Early death Maternal Grandfather   . Stroke Paternal Grandfather   . Hyperlipidemia Paternal Grandfather   . Alcohol abuse Paternal Grandfather   . Early death Paternal Grandfather   . Hypertension Paternal Grandfather   . Depression Paternal Grandmother   . Dementia Paternal Grandmother   . Arthritis Paternal Grandmother   . Mental illness Paternal Grandmother   . Alcohol abuse Daughter   . Depression Daughter   . Drug abuse Daughter   . Alcohol abuse Son   . Depression Son   . Colon cancer Neg Hx    Social History   Social History Narrative   Spiritual Beliefs: methodist   Marital status/children/pets: In second marriage.  First husband committed suicide.  2 children.   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  Has patient had a PCN reaction causing severe rash involving mucus membranes or skin necrosis:no Has patient had a PCN reaction that required hospitalization No Has patient had a PCN reaction occurring within the last 10 years: No If all of the above answers are "NO", then may proceed with Cephalosporin use.   Promethazine Hcl Other (See Comments)   Muscle twitching and contracture    Adhesive [tape]    Red splotches   Beta Vulgaris    Promethazine    Doxycycline Nausea And Vomiting   Morphine Sulfate Nausea And Vomiting      Medication List       Accurate as of May 23, 2020 10:40 AM. If you have any questions, ask your nurse or doctor.        STOP taking these medications   estradiol 0.1 MG/GM vaginal cream Commonly known as: ESTRACE Stopped by: Howard Pouch, DO   multivitamin with minerals Tabs tablet Stopped by: Howard Pouch, DO     TAKE these medications   ALPRAZolam 0.5 MG tablet Commonly known as: XANAX Take 1 tablet (0.5 mg total) by mouth 3 (three) times daily as needed for anxiety.   atenolol 25 MG tablet Commonly known as: TENORMIN Take 0.5 tablets (12.5 mg total) by mouth daily.   B-D 3CC LUER-LOK SYR 25GX1" 25G X 1" 3 ML Misc Generic drug: SYRINGE-NEEDLE (DISP) 3 ML USE WITH CYANOCOBALAMIN EVERY 14 DAYS   cholecalciferol 1000 units tablet Commonly known as: VITAMIN D Take 1,000 Units by mouth every evening.   clobetasol 0.05 % external solution Commonly known as: TEMOVATE APPLY 1 APPLICATION TOPICALLY DAILY AS NEEDED FOR DERMATITIS   cyanocobalamin 1000 MCG/ML injection Commonly known as: (VITAMIN B-12) Inject 1 mL (1,000 mcg total) into the  muscle every 14 (fourteen) days.   estradiol 0.075 MG/24HR Commonly  known as: VIVELLE-DOT Place 1 patch onto the skin 2 (two) times a week. Wednesday and Sunday What changed: Another medication with the same name was removed. Continue taking this medication, and follow the directions you see here. Changed by: Howard Pouch, DO   fluorometholone 0.1 % ophthalmic suspension Commonly known as: FML 1 drop 3 (three) times daily.   FLUoxetine 40 MG capsule Commonly known as: PROZAC Take 1 capsule (40 mg total) by mouth daily after breakfast.   fluticasone 50 MCG/ACT nasal spray Commonly known as: FLONASE Place 2 sprays into the nose daily.   hydrocortisone 2.5 % rectal cream Commonly known as: ANUSOL-HC Place 1 application rectally 2 (two) times daily.   lamoTRIgine 200 MG tablet Commonly known as: LAMICTAL Take 1 tablet (200 mg total) by mouth at bedtime.   meloxicam 15 MG tablet Commonly known as: MOBIC Take 1 tablet (15 mg total) by mouth daily.   omeprazole 40 MG capsule Commonly known as: PRILOSEC Take 1 capsule (40 mg total) by mouth daily before breakfast.   Pazeo 0.7 % Soln Generic drug: Olopatadine HCl   Olopatadine HCl 0.2 % Soln   sucralfate 1 GM/10ML suspension Commonly known as: Carafate TAKE 10 MLS BY MOUTH 3 TIMES DAILY AS NEEDED FOR DIGESTION   trimethoprim-polymyxin b ophthalmic solution Commonly known as: Polytrim Place 1 drop into the left eye every 6 (six) hours.       ROS: 14 pt review of systems performed and negative (unless mentioned in an HPI)  Objective: BP 114/70   Pulse 63   Temp 98.6 F (37 C) (Oral)   Ht 5\' 2"  (1.575 m)   Wt 119 lb (54 kg)   SpO2 96%   BMI 21.77 kg/m  Gen: Afebrile. No acute distress. Nontoxic in appearance, well-developed, well-nourished,  Pleasant female.  HENT: AT. [place]. Bilateral TM visualized and normal in appearance, normal external auditory canal. MMM, no oral lesions, adequate dentition. Bilateral nares within normal limits. Throat without erythema, ulcerations or exudates. no  Cough on exam, no hoarseness on exam. Eyes:Pupils Equal Round Reactive to light, Extraocular movements intact,  Conjunctiva without redness, discharge or icterus. Neck/lymp/endocrine: Supple,no lymphadenopathy, no thyromegaly CV: RRR no murmur, no edema, +2/4 P posterior tibialis pulses.  Chest: CTAB, no wheeze, rhonchi or crackles. normal Respiratory effort. good Air movement. Abd: Soft. flat. NTND. BS present. no Masses palpated. No hepatosplenomegaly. No rebound tenderness or guarding. Skin: no rashes, purpura or petechiae. Warm and well-perfused. Skin intact. Neuro/Msk:  Normal gait. PERLA. EOMi. Alert. Oriented x3.  Cranial nerves II through XII intact. Muscle strength 5/5 upper/lower extremity. DTRs equal bilaterally. Psych: Normal affect, dress and demeanor. Normal speech. Normal thought content and judgment.   No exam data present  Assessment/plan: [name] is a 64 y.o. female present for  CPE  Heart palpitations Stable.  Continue atenolol 12.5 mg Qd - Comprehensive metabolic panel - TSH F/u 5.5 mo  B12 deficiency - Vitamin B12 Eczema, unspecified type Stable continue clobetasol cream PRN Lipid screening - Lipid panel Screening for deficiency anemia - CBC Diabetes mellitus screening - Hemoglobin A1c  Encounter for preventive health examination Patient was encouraged to exercise greater than 150 minutes a week. Patient was encouraged to choose a diet filled with fresh fruits and vegetables, and lean meats. AVS provided to patient today for education/recommendation on gender specific health and safety maintenance. Colonoscopy: completed 2014, 10 yr follow up [name].  Mammogram: scheduled 06/20/2020 Cervical cancer screening: last pap: 2017- Dr. Nori Riis -gyn Immunizations: tdap UTD 2015, Influenza UTD 10/21 (encouraged yearly),shingrix completed, covid series completed Infectious disease screening: Hep C completed DEXA: last completed 2020 at gyn  Return in  about 1 year (around 05/23/2021) for CPE (30 min).  Orders Placed This Encounter  Procedures  . Comprehensive metabolic panel  . Hemoglobin A1c  . Lipid panel  . TSH  . Vitamin B12  . CBC   Meds ordered this encounter  Medications  . clobetasol (TEMOVATE) 0.05 % external solution    Sig: APPLY 1 APPLICATION TOPICALLY DAILY AS NEEDED FOR DERMATITIS    Dispense:  50 mL    Refill:  2  . atenolol (TENORMIN) 25 MG tablet    Sig: Take 0.5 tablets (12.5 mg total) by mouth daily.    Dispense:  45 tablet    Refill:  1  . meloxicam (MOBIC) 15 MG tablet    Sig: Take 1 tablet (15 mg total) by mouth daily.    Dispense:  30 tablet    Refill:  5   Referral Orders  No referral(s) requested today     Electronically signed by: Howard Pouch, [place]

## 2020-06-01 MED FILL — BD 3 ML SYRINGE 25GX1: 25G X 1" | 14 days supply | Qty: 2 | Fill #3

## 2020-06-01 MED FILL — CYANOCOBALAMIN 1,000 MCG/ML: 1000 | 28 days supply | Qty: 2 | Fill #4

## 2020-06-11 ENCOUNTER — Encounter: Payer: Self-pay | Admitting: Family Medicine

## 2020-06-11 ENCOUNTER — Ambulatory Visit: Payer: 59 | Admitting: Family Medicine

## 2020-06-11 ENCOUNTER — Other Ambulatory Visit: Payer: Self-pay

## 2020-06-11 ENCOUNTER — Ambulatory Visit: Payer: Self-pay

## 2020-06-11 VITALS — BP 116/74 | HR 52 | Ht 62.0 in | Wt 119.0 lb

## 2020-06-11 DIAGNOSIS — M1811 Unilateral primary osteoarthritis of first carpometacarpal joint, right hand: Secondary | ICD-10-CM

## 2020-06-11 NOTE — Assessment & Plan Note (Addendum)
Chronic problem with no significant improvement with the conservative therapy.  We discussed the potential for steroid injection but patient did not respond well to them in her back.  We discussed with her about the possibility of PRP which patient is going to consider.  Continue bracing or possible.  We discussed different medications but patient has had many different allergies.  Patient will likely set up with therapy and follow-up with me again to have this done in the near future.  Total time discussed with patient and reviewing the x-rays and discussing different management 33 minutes

## 2020-06-11 NOTE — Patient Instructions (Signed)
Read about PRP We will set up when you have multiple days off

## 2020-06-11 NOTE — Progress Notes (Signed)
[place] Phone: [phone] Subjective:   [name], am serving as a scribe for Dr. Hulan Saas. This visit occurred during the SARS-CoV-2 public health emergency.  Safety protocols were in place, including screening questions prior to the visit, additional usage of staff PPE, and extensive cleaning of exam room while observing appropriate contact time as indicated for disinfecting solutions.   I'm seeing this patient by the request  of:  Kuneff, Renee A, DO  CC: Hand pain follow-up  [id number]   05/08/2020 Moderate to severe noted on ultrasound and x-ray pending., brace given today for some relief.  Discussed icing regimen and home exercises.  Discussed topical anti-inflammatories.  Worsening pain will consider injection, formal physical therapy, PRP or potential surgical intervention.  Follow-up with me again 4 to 6 weeks   Update 06/11/2020 [name] is a 64 y.o. female coming in with complaint of right wrist and thumb pain. Patient states that she feels good as this is her 6 day off for the holiday. Patient is compensating at work so her wrist is now bothering her.   Xray right wrist 05/08/2020 IMPRESSION: Mild degenerative changes most pronounced in the 1st carpometacarpal joint. No acute bony abnormality.  Past Medical History:  Diagnosis Date  . Allergy   . Anemia    after birth of child 75yrs ago  . Anxiety and depression    with insomnia  . Blood transfusion without reported diagnosis    x3  . Chronic back pain    ? RA, ? Lupus - reports saw rheumatologist in the past but better now, sees Dr. Trenton Gammon  . Eczema   . GERD (gastroesophageal reflux disease)    takes Omeprazole daily, with nausea  . Heart murmur   . Hemorrhoids   . History of kidney stones    passed on her own  . History of migraine    last one a yr ago and takes Zomig prn  . Hot flashes 11/10/2014  . HTN  (hypertension) 03/31/2012  . Hx of echocardiogram    Echo (8/15):  EF 55-60%, no RWMA  . IBS (irritable bowel syndrome)    constipation predominant  . Interstitial cystitis    hx of UTI  . Numbness    both legs and related to back  . Osteoporosis    osteopenia  . Palpitations    on metoprolol in past but made BP too low  . Pneumonia    hx of;last time 36yrs ago  . PONV (postoperative nausea and vomiting)    laryngospasm-1999  . Poor vision 10/15/2015   -? Ocular rosacea -sees opthomology   . Raynaud's disease /phenomenon 10/21/2012   no meds, worse in the winter or cold environment.   . Rheumatoid aortitis    uses Diclofenac gel;Rheumatoid  . Seasonal allergies    Flonase daily   . Urinary incontinence    Past Surgical History:  Procedure Laterality Date  . ABDOMINAL HYSTERECTOMY  1997  . BREAST BIOPSY  1999   benign  . COLONOSCOPY  02/11/2013 and 12/24/04   internal and external hemorrhoids  . ENDOMETRIAL ABLATION    . EXCISION MORTON'S NEUROMA Right   . FLEXIBLE SIGMOIDOSCOPY  03/07/2008   internal and external hemorrhoids  . POSTERIOR LUMBAR FUSION  05/02/2013   L4-L5 fusion (cage/screws/bone graft) Dr. Annette Stable  . TONSILLECTOMY    . UPPER GASTROINTESTINAL ENDOSCOPY  10/23/2010   gastritis, irregular Z-line  .  wisdom teeth extracted     Social History   Socioeconomic History  . Marital status: Married    Spouse name: Not on file  . Number of children: Not on file  . Years of education: Not on file  . Highest education level: Not on file  Occupational History  . Occupation: Programmer, multimedia: [employer]  Tobacco Use  . Smoking status: Never Smoker  . Smokeless tobacco: Never Used  Vaping Use  . Vaping Use: Never used  Substance and Sexual Activity  . Alcohol use: Yes    Alcohol/week: 2.0 standard drinks    Types: 1 Shots of liquor, 1 Standard drinks or equivalent per week    Comment: one margarita a week  . Drug use: No  . Sexual activity: Not Currently     Partners: Male  Other Topics Concern  . Not on file  Social History Narrative   Spiritual Beliefs: methodist   Marital status/children/pets: In second marriage.  First husband committed suicide.  2 children.   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 Difficulty of Paying Living Expenses: Not on file  Food Insecurity:   . Worried About Charity fundraiser in the Last Year: Not on file  . Ran Out of Food in the Last Year: Not on file  Transportation Needs:   . Lack of Transportation (Medical): Not on file  . Lack of Transportation (Non-Medical): Not on file  Physical Activity:   . Days of Exercise per Week: Not on file  . Minutes of Exercise per Session: Not on file  Stress:   . Feeling of Stress : Not on file  Social Connections:   . Frequency of Communication with Friends and Family: Not on file  . Frequency of Social Gatherings with Friends and Family: Not on file  . Attends Religious Services: Not on file  . Active Member of Clubs or Organizations: Not on file  . Attends Archivist Meetings: Not on file  . Marital Status: Not on file   Allergies  Allergen Reactions  . Aspartame And Phenylalanine Other (See Comments)    Migraines  . Beef-Derived Products Other (See Comments)    severe cramping  . Citrus Other (See Comments)    Causes Interstitial cystitis flares  . Penicillins Hives    Denies airway involvement Has patient had a PCN reaction causing immediate rash, facial/tongue/throat swelling, SOB or lightheadedness with hypotension: yes hives.  Has patient had a PCN reaction causing severe rash involving mucus membranes or skin necrosis:no Has patient had a PCN reaction that required hospitalization No Has  patient had a PCN reaction occurring within the last 10 years: No If all of the above answers are "NO", then may proceed with Cephalosporin use.   . Promethazine Hcl Other (See Comments)    Muscle twitching and contracture   . Adhesive [Tape]     Red splotches  . Beta Vulgaris   . Promethazine   . Doxycycline Nausea And Vomiting  . Morphine Sulfate Nausea And Vomiting   Family History  Problem Relation Age of Onset  .  Coronary artery disease Father   . Heart attack Father   . Hypertension Father   . Depression Father   . Hyperlipidemia Father   . Alcohol abuse Father   . Early death Father   . Kidney disease Father   . Mental illness Father   . Diverticulosis Mother   . Hypertension Mother   . Osteoarthritis Mother   . Depression Mother   . Hyperlipidemia Mother   . Kidney disease Mother   . Alcohol abuse Sister   . Depression Sister   . Diabetes Paternal Uncle   . Hypertension Maternal Grandmother   . Stroke Maternal Grandmother   . Hypertension Maternal Grandfather   . Pancreatic cancer Maternal Grandfather   . Early death Maternal Grandfather   . Stroke Paternal Grandfather   . Hyperlipidemia Paternal Grandfather   . Alcohol abuse Paternal Grandfather   . Early death Paternal Grandfather   . Hypertension Paternal Grandfather   . Depression Paternal Grandmother   . Dementia Paternal Grandmother   . Arthritis Paternal Grandmother   . Mental illness Paternal Grandmother   . Alcohol abuse Daughter   . Depression Daughter   . Drug abuse Daughter   . Alcohol abuse Son   . Depression Son   . Colon cancer Neg Hx     Current Outpatient Medications (Endocrine & Metabolic):  .  estradiol (VIVELLE-DOT) 0.075 MG/24HR, Place 1 patch onto the skin 2 (two) times a week. Wednesday and Sunday  Current Outpatient Medications (Cardiovascular):  .  atenolol (TENORMIN) 25 MG tablet, Take 0.5 tablets (12.5 mg total) by mouth daily.  Current Outpatient Medications  (Respiratory):  .  fluticasone (FLONASE) 50 MCG/ACT nasal spray, Place 2 sprays into the nose daily.  Current Outpatient Medications (Analgesics):  .  meloxicam (MOBIC) 15 MG tablet, Take 1 tablet (15 mg total) by mouth daily.  Current Outpatient Medications (Hematological):  .  cyanocobalamin (,VITAMIN B-12,) 1000 MCG/ML injection, Inject 1 mL (1,000 mcg total) into the muscle every 14 (fourteen) days.  Current Outpatient Medications (Other):  Marland Kitchen  ALPRAZolam (XANAX) 0.5 MG tablet, Take 1 tablet (0.5 mg total) by mouth 3 (three) times daily as needed for anxiety. .  B-D 3CC LUER-LOK SYR 25GX1" 25G X 1" 3 ML MISC, USE WITH CYANOCOBALAMIN EVERY 14 DAYS .  cholecalciferol (VITAMIN D) 1000 UNITS tablet, Take 1,000 Units by mouth every evening. .  clobetasol (TEMOVATE) 0.05 % external solution, APPLY 1 APPLICATION TOPICALLY DAILY AS NEEDED FOR DERMATITIS .  fluorometholone (FML) 0.1 % ophthalmic suspension, 1 drop 3 (three) times daily. Marland Kitchen  FLUoxetine (PROZAC) 40 MG capsule, Take 1 capsule (40 mg total) by mouth daily after breakfast. .  hydrocortisone (ANUSOL-HC) 2.5 % rectal cream, Place 1 application rectally 2 (two) times daily. Marland Kitchen  lamoTRIgine (LAMICTAL) 200 MG tablet, Take 1 tablet (200 mg total) by mouth at bedtime. .  Olopatadine HCl 0.2 % SOLN,  .  omeprazole (PRILOSEC) 40 MG capsule, Take 1 capsule (40 mg total) by mouth daily before breakfast. .  PAZEO 0.7 % SOLN,  .  sucralfate (CARAFATE) 1 GM/10ML suspension, TAKE 10 MLS BY MOUTH 3 TIMES DAILY AS NEEDED FOR DIGESTION .  trimethoprim-polymyxin b (POLYTRIM) ophthalmic solution, Place 1 drop into the left eye every 6 (six) hours.   Reviewed prior external information including notes and imaging from  primary care provider As well as notes that were available from care everywhere and other healthcare systems.  Past medical history, social, surgical and family history all reviewed  in electronic medical record.  No pertanent information  unless stated regarding to the chief complaint.   Review of Systems:  No headache, visual changes, nausea, vomiting, diarrhea, constipation, dizziness, abdominal pain, skin rash, fevers, chills, night sweats, weight loss, swollen lymph nodes, body aches, joint swelling, chest pain, shortness of breath, mood changes. POSITIVE muscle aches  Objective  Blood pressure 116/74, pulse (!) 52, height 5\' 2"  (1.575 m), weight 119 lb (54 kg), SpO2 98 %.   General: No apparent distress alert and oriented x3 mood and affect normal, dressed appropriately.  HEENT: Pupils equal, extraocular movements intact  Respiratory: Patient's speak in full sentences and does not appear short of breath  Cardiovascular: No lower extremity edema, non tender, no erythema  Hand exam shows the patient does have a positive grind test of the first [facility] joint.  Patient has mild swelling compared to the contralateral side.  Good grip strength though still noted.  Neurovascular intact distally.  Wrist no significant pain at this moment.  Negative Tinel's over the carpal tunnel.  Limited musculoskeletal ultrasound was performed and interpreted by Lyndal Pulley  Limited ultrasound of patient's right [facility] joint shows the patient does have an effusion noted with a fairly large bone spur.  Moderate degenerative changes noted of the joint also noted. 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

## 2020-06-12 MED FILL — ESTRADIOL 0.1 MG/24HR PTTW: 0.1 | 84 days supply | Qty: 24 | Fill #1

## 2020-06-13 ENCOUNTER — Ambulatory Visit: Payer: 59 | Admitting: Family Medicine

## 2020-06-20 ENCOUNTER — Ambulatory Visit: Payer: 59

## 2020-06-28 ENCOUNTER — Telehealth: Payer: Self-pay | Admitting: Family Medicine

## 2020-06-28 NOTE — Telephone Encounter (Signed)
Left message for patient to call back  

## 2020-06-28 NOTE — Telephone Encounter (Signed)
Patient called stating that she is still having thumb pain and swelling. She said that it seems to be getting worse. She asked if it was okay for her to keep taking asprin?  She asked if someone could call her back at work [phone]).

## 2020-06-28 NOTE — Telephone Encounter (Signed)
We discussed PRP last time or we could do steroid.

## 2020-06-29 NOTE — Telephone Encounter (Signed)
Spoke to pt, she is scheduled 12/29 for PRP. She would like to know if she should stop taking Aspirin before her PRP inj. If so, how soon should she stop taking it?

## 2020-06-29 NOTE — Telephone Encounter (Signed)
She can stop it 5 days before, not a huge deal though either

## 2020-07-02 NOTE — Telephone Encounter (Signed)
Sent patient MyChart message.

## 2020-07-09 ENCOUNTER — Other Ambulatory Visit: Payer: Self-pay

## 2020-07-09 ENCOUNTER — Other Ambulatory Visit: Payer: Self-pay | Admitting: Obstetrics & Gynecology

## 2020-07-09 ENCOUNTER — Ambulatory Visit
Admission: RE | Admit: 2020-07-09 | Discharge: 2020-07-09 | Disposition: A | Discharge status: Home / Self Care | Payer: 59 | Source: Ambulatory Visit | Attending: Obstetrics & Gynecology | Admitting: Obstetrics & Gynecology

## 2020-07-09 DIAGNOSIS — N63 Unspecified lump in unspecified breast: Secondary | ICD-10-CM

## 2020-07-09 DIAGNOSIS — Z1231 Encounter for screening mammogram for malignant neoplasm of breast: Secondary | ICD-10-CM

## 2020-07-11 ENCOUNTER — Other Ambulatory Visit: Payer: Self-pay

## 2020-07-11 ENCOUNTER — Ambulatory Visit: Payer: Self-pay | Admitting: Family Medicine

## 2020-07-11 ENCOUNTER — Ambulatory Visit: Payer: Self-pay

## 2020-07-11 ENCOUNTER — Encounter: Payer: Self-pay | Admitting: Family Medicine

## 2020-07-11 VITALS — BP 110/70 | HR 56 | Ht 62.0 in | Wt 120.0 lb

## 2020-07-11 DIAGNOSIS — M1811 Unilateral primary osteoarthritis of first carpometacarpal joint, right hand: Secondary | ICD-10-CM

## 2020-07-11 DIAGNOSIS — M79642 Pain in left hand: Secondary | ICD-10-CM

## 2020-07-11 NOTE — Patient Instructions (Addendum)
Good to see you See me again in 4-6 weeks Send me a message in a week

## 2020-07-11 NOTE — Progress Notes (Signed)
[place] Phone: [phone] Subjective:   [name], am serving as a scribe for Dr. Hulan Saas. This visit occurred during the SARS-CoV-2 public health emergency.  Safety protocols were in place, including screening questions prior to the visit, additional usage of staff PPE, and extensive cleaning of exam room while observing appropriate contact time as indicated for disinfecting solutions.   I'm seeing this patient by the request  of:  Kuneff, Renee A, DO  CC: Right hand pain follow-up  [id number]   06/11/2020 Chronic problem with no significant improvement with the conservative therapy.  We discussed the potential for steroid injection but patient did not respond well to them in her back.  We discussed with her about the possibility of PRP which patient is going to consider.  Continue bracing or possible.  We discussed different medications but patient has had many different allergies.  Patient will likely set up with therapy and follow-up with me again to have this done in the near future.  Total time discussed with patient and reviewing the x-rays and discussing different management 33 minutes  Update 07/11/2020 [name] is a 64 y.o. female coming in with complaint of left CMC joint pain. Patient here for PRP today. States she is not too bad today. Has been off so she hasn't been doing much typing.  Patient though is concerned when she starts doing more and if the pain starts to increase again.       Past Medical History:  Diagnosis Date  . Allergy   . Anemia    after birth of child 82yrs ago  . Anxiety and depression    with insomnia  . Blood transfusion without reported diagnosis    x3  . Chronic back pain    ? RA, ? Lupus - reports saw rheumatologist in the past but better now, sees Dr. Trenton Gammon  . Eczema   . GERD (gastroesophageal reflux disease)    takes Omeprazole daily, with  nausea  . Heart murmur   . Hemorrhoids   . History of kidney stones    passed on her own  . History of migraine    last one a yr ago and takes Zomig prn  . Hot flashes 11/10/2014  . HTN (hypertension) 03/31/2012  . Hx of echocardiogram    Echo (8/15):  EF 55-60%, no RWMA  . IBS (irritable bowel syndrome)    constipation predominant  . Interstitial cystitis    hx of UTI  . Numbness    both legs and related to back  . Osteoporosis    osteopenia  . Palpitations    on metoprolol in past but made BP too low  . Pneumonia    hx of;last time 12yrs ago  . PONV (postoperative nausea and vomiting)    laryngospasm-1999  . Poor vision 10/15/2015   -? Ocular rosacea -sees opthomology   . Raynaud's disease /phenomenon 10/21/2012   no meds, worse in the winter or cold environment.   . Rheumatoid aortitis    uses Diclofenac gel;Rheumatoid  . Seasonal allergies    Flonase daily   . Urinary incontinence    Past Surgical History:  Procedure Laterality Date  . ABDOMINAL HYSTERECTOMY  1997  . BREAST BIOPSY  1999   benign  . COLONOSCOPY  02/11/2013 and 12/24/04   internal and external hemorrhoids  . ENDOMETRIAL ABLATION    . EXCISION MORTON'S NEUROMA Right   .  FLEXIBLE SIGMOIDOSCOPY  03/07/2008   internal and external hemorrhoids  . POSTERIOR LUMBAR FUSION  05/02/2013   L4-L5 fusion (cage/screws/bone graft) Dr. Jordan Likes  . TONSILLECTOMY    . UPPER GASTROINTESTINAL ENDOSCOPY  10/23/2010   gastritis, irregular Z-line  . wisdom teeth extracted     Social History   Socioeconomic History  . Marital status: Married    Spouse name: Not on file  . Number of children: Not on file  . Years of education: Not on file  . Highest education level: Not on file  Occupational History  . Occupation: Teacher, adult education: [employer]  Tobacco Use  . Smoking status: Never Smoker  . Smokeless tobacco: Never Used  Vaping Use  . Vaping Use: Never used  Substance and Sexual Activity  . Alcohol use: Yes     Alcohol/week: 2.0 standard drinks    Types: 1 Shots of liquor, 1 Standard drinks or equivalent per week    Comment: one margarita a week  . Drug use: No  . Sexual activity: Not Currently    Partners: Male  Other Topics Concern  . Not on file  Social History Narrative   Spiritual Beliefs: methodist   Marital status/children/pets: In second marriage.  First husband committed suicide.  2 children.   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 Aspartame And Phenylalanine Other (See Comments)    Migraines  . Beef-Derived Products Other (See Comments)    severe cramping  . Citrus Other (See Comments)    Causes Interstitial cystitis flares  . Penicillins Hives    Denies airway involvement Has patient had a PCN reaction causing immediate rash, facial/tongue/throat swelling, SOB or lightheadedness with hypotension: yes hives.  Has patient had a PCN reaction causing severe rash involving mucus membranes or skin necrosis:no Has patient had a PCN reaction that required hospitalization No Has patient had a PCN reaction occurring within the last 10 years: No If all of the above answers are "NO", then may proceed with Cephalosporin use.   . Promethazine Hcl Other (See Comments)    Muscle twitching and contracture   . Adhesive [Tape]     Red splotches  . Beta Vulgaris   . Promethazine   . Doxycycline Nausea And Vomiting  . Morphine Sulfate Nausea And Vomiting   Family History  Problem Relation Age of Onset  . Coronary artery disease Father   .  Heart attack Father   . Hypertension Father   . Depression Father   . Hyperlipidemia Father   . Alcohol abuse Father   . Early death Father   . Kidney disease Father   . Mental illness Father   . Diverticulosis Mother   . Hypertension Mother   . Osteoarthritis Mother   . Depression Mother   . Hyperlipidemia Mother   . Kidney disease Mother   . Alcohol abuse Sister   . Depression  Sister   . Diabetes Paternal Uncle   . Hypertension Maternal Grandmother   . Stroke Maternal Grandmother   . Hypertension Maternal Grandfather   . Pancreatic cancer Maternal Grandfather   . Early death Maternal Grandfather   . Stroke Paternal Grandfather   . Hyperlipidemia Paternal Grandfather   . Alcohol abuse Paternal Grandfather   . Early death Paternal Grandfather   . Hypertension Paternal Grandfather   . Depression Paternal Grandmother   . Dementia Paternal Grandmother   . Arthritis Paternal Grandmother   . Mental illness Paternal Grandmother   . Alcohol abuse Daughter   . Depression Daughter   . Drug abuse Daughter   . Alcohol abuse Son   . Depression Son   . Colon cancer Neg Hx     Current Outpatient Medications (Endocrine & Metabolic):  .  estradiol (VIVELLE-DOT) 0.075 MG/24HR, Place 1 patch onto the skin 2 (two) times a week. Wednesday and Sunday  Current Outpatient Medications (Cardiovascular):  .  atenolol (TENORMIN) 25 MG tablet, Take 0.5 tablets (12.5 mg total) by mouth daily.  Current Outpatient Medications (Respiratory):  .  fluticasone (FLONASE) 50 MCG/ACT nasal spray, Place 2 sprays into the nose daily.  Current Outpatient Medications (Analgesics):  .  meloxicam (MOBIC) 15 MG tablet, Take 1 tablet (15 mg total) by mouth daily.  Current Outpatient Medications (Hematological):  .  cyanocobalamin (,VITAMIN B-12,) 1000 MCG/ML injection, Inject 1 mL (1,000 mcg total) into the muscle every 14 (fourteen) days.  Current Outpatient Medications (Other):  Marland Kitchen  ALPRAZolam (XANAX) 0.5  MG tablet, Take 1 tablet (0.5 mg total) by mouth 3 (three) times daily as needed for anxiety. .  B-D 3CC LUER-LOK SYR 25GX1" 25G X 1" 3 ML MISC, USE WITH CYANOCOBALAMIN EVERY 14 DAYS .  cholecalciferol (VITAMIN D) 1000 UNITS tablet, Take 1,000 Units by mouth every evening. .  clobetasol (TEMOVATE) 0.05 % external solution, APPLY 1 APPLICATION TOPICALLY DAILY AS NEEDED FOR DERMATITIS .  fluorometholone (FML) 0.1 % ophthalmic suspension, 1 drop 3 (three) times daily. Marland Kitchen  FLUoxetine (PROZAC) 40 MG capsule, Take 1 capsule (40 mg total) by mouth daily after breakfast. .  hydrocortisone (ANUSOL-HC) 2.5 % rectal cream, Place 1 application rectally 2 (two) times daily. Marland Kitchen  lamoTRIgine (LAMICTAL) 200 MG tablet, Take 1 tablet (200 mg total) by mouth at bedtime. .  Olopatadine HCl 0.2 % SOLN,  .  omeprazole (PRILOSEC) 40 MG capsule, Take 1 capsule (40 mg total) by mouth daily before breakfast. .  PAZEO 0.7 % SOLN,  .  sucralfate (CARAFATE) 1 GM/10ML suspension, TAKE 10 MLS BY MOUTH 3 TIMES DAILY AS NEEDED FOR DIGESTION .  trimethoprim-polymyxin b (POLYTRIM) ophthalmic solution, Place 1 drop into the left eye every 6 (six) hours.   Reviewed prior external information including notes and imaging from  primary care provider As well as notes that were available from care everywhere and other healthcare systems.  Past medical history, social, surgical and family history all reviewed in electronic medical record.  No pertanent information unless stated regarding to the chief complaint.   Review of Systems:  No headache, visual changes, nausea, vomiting, diarrhea, constipation, dizziness, abdominal pain, skin rash, fevers, chills, night sweats, weight loss, swollen lymph nodes, body aches, joint swelling, chest pain, shortness of breath, mood changes. POSITIVE muscle aches  Objective  Blood pressure 110/70, pulse (!) 56, height 5\' 2"  (1.575 m), weight 120 lb (54.4 kg), SpO2 98 %.   General: No apparent  distress alert and oriented x3  mood and affect normal, dressed appropriately.  HEENT: Pupils equal, extraocular movements intact  Respiratory: Patient's speak in full sentences and does not appear short of breath  Hand exam shows the patient does have a positive CMC grind test.  Tender to palpation over the [facility] joint.  Negative Tinel's.  Good grip strength.  Does have some mild effusion noted over the [facility] joint.  Procedure: Real-time Ultrasound Guided Injection of right CMC joint Device: GE Logiq Q7 Ultrasound guided injection is preferred based studies that show increased duration, increased effect, greater accuracy, decreased procedural pain, increased response rate, and decreased cost with ultrasound guided versus blind injection.  Verbal informed consent obtained.  Time-out conducted.  Noted no overlying erythema, induration, or other signs of local infection.  Skin prepped in a sterile fashion.  Local anesthesia: Topical Ethyl chloride.  With sterile technique and under real time ultrasound guidance: With a 25-gauge half inch needle injected with 0.5 cc of 0.5% Marcaine and then injected with 2 cc of precentrifuge PRP. Completed without difficulty  Pain immediately improved suggesting accurate placement of the medication.  Advised to call if fevers/chills, erythema, induration, drainage, or persistent bleeding.  Impression: Technically successful ultrasound guided injection.    Impression and Recommendations:     The above documentation has been reviewed and is accurate and complete Lyndal Pulley, DO

## 2020-07-11 NOTE — Assessment & Plan Note (Signed)
Due to patient's young age we attempted PRP today.  Patient tolerated relatively well with a mild vasovagal.  Patient did not completely pass out but did have a little bit lightheadedness.  Patient did lay down for approximately 10 minutes and then seemed to be fine.  Post PRP protocol given.  Patient will follow up with me again in 3 to 4 weeks for further evaluation continue bracing icing and topical anti-inflammatories.

## 2020-07-12 ENCOUNTER — Ambulatory Visit
Admission: RE | Admit: 2020-07-12 | Discharge: 2020-07-12 | Disposition: A | Discharge status: Home / Self Care | Payer: 59 | Source: Ambulatory Visit | Attending: Obstetrics & Gynecology | Admitting: Obstetrics & Gynecology

## 2020-07-12 ENCOUNTER — Other Ambulatory Visit: Payer: Self-pay

## 2020-07-12 DIAGNOSIS — N6320 Unspecified lump in the left breast, unspecified quadrant: Secondary | ICD-10-CM | POA: Diagnosis not present

## 2020-07-12 DIAGNOSIS — N63 Unspecified lump in unspecified breast: Secondary | ICD-10-CM

## 2020-07-18 MED FILL — CYANOCOBALAMIN 1,000 MCG/ML: 1000 | 28 days supply | Qty: 2 | Fill #5

## 2020-07-31 ENCOUNTER — Other Ambulatory Visit (HOSPITAL_COMMUNITY): Payer: Self-pay | Admitting: Dermatology

## 2020-07-31 DIAGNOSIS — L821 Other seborrheic keratosis: Secondary | ICD-10-CM | POA: Diagnosis not present

## 2020-07-31 DIAGNOSIS — D225 Melanocytic nevi of trunk: Secondary | ICD-10-CM | POA: Diagnosis not present

## 2020-07-31 DIAGNOSIS — D239 Other benign neoplasm of skin, unspecified: Secondary | ICD-10-CM | POA: Diagnosis not present

## 2020-07-31 DIAGNOSIS — L719 Rosacea, unspecified: Secondary | ICD-10-CM | POA: Diagnosis not present

## 2020-07-31 DIAGNOSIS — L814 Other melanin hyperpigmentation: Secondary | ICD-10-CM | POA: Diagnosis not present

## 2020-07-31 MED FILL — metroNIDAZOLE 0.75 % LOTN: 0.75 | 30 days supply | Qty: 59 | Fill #0

## 2020-08-06 ENCOUNTER — Other Ambulatory Visit: Payer: Self-pay | Admitting: Family Medicine

## 2020-08-06 ENCOUNTER — Encounter: Payer: Self-pay | Admitting: Family Medicine

## 2020-08-06 ENCOUNTER — Ambulatory Visit: Payer: 59 | Admitting: Family Medicine

## 2020-08-06 ENCOUNTER — Other Ambulatory Visit: Payer: Self-pay

## 2020-08-06 ENCOUNTER — Ambulatory Visit: Payer: Self-pay

## 2020-08-06 VITALS — BP 104/68 | HR 71 | Ht 62.0 in | Wt 122.0 lb

## 2020-08-06 DIAGNOSIS — M79642 Pain in left hand: Secondary | ICD-10-CM | POA: Diagnosis not present

## 2020-08-06 DIAGNOSIS — M359 Systemic involvement of connective tissue, unspecified: Secondary | ICD-10-CM | POA: Insufficient documentation

## 2020-08-06 DIAGNOSIS — M1811 Unilateral primary osteoarthritis of first carpometacarpal joint, right hand: Secondary | ICD-10-CM

## 2020-08-06 HISTORY — DX: Systemic involvement of connective tissue, unspecified: M35.9

## 2020-08-06 MED ORDER — GABAPENTIN 100 MG PO CAPS: 100.0000 mg | ORAL_CAPSULE | Freq: Every day | ORAL | 0 refills | Status: DC

## 2020-08-06 MED FILL — GABAPENTIN 100 MG CAPSULE: 100 | 90 days supply | Qty: 90 | Fill #0

## 2020-08-06 NOTE — Progress Notes (Signed)
Carol [name] Phone: [phone] Subjective:   Carol [name], am serving as [name] scribe for Dr. Hulan [name]. This visit occurred during the SARS-CoV-2 public health emergency.  Safety protocols were in place, including screening questions prior to the visit, additional usage of staff PPE, and extensive cleaning of exam room while observing appropriate contact time as indicated for disinfecting solutions.   I'm seeing this patient by the request  of:  Kuneff, Renee A, DO  CC: Right hand pain follow-up  [id number]   07/11/2020 Due to patient's young age we attempted PRP today.  Patient tolerated relatively well with [name] mild vasovagal.  Patient did not completely pass out but did have [name] little bit lightheadedness.  Patient did lay down for approximately 10 minutes and then seemed to be fine.  Post PRP protocol given.  Patient will follow up with me again in 3 to 4 weeks for further evaluation continue bracing icing and topical anti-inflammatories.  Update 08/06/2020 Carol [name] is [name] 65 y.o. female coming in with complaint of left hand pain. States that her pain is achy with weather changes but has improved slightly since her last appointment. Also having achy pain in the other joints in her hands. Wonder if [name] lab that was positive through Rheumatology is the reason for her symptoms. Was put in Humira for [name] short time which did not help her pain.    Patient does have degenerative changes of the first metatarsal.  Attempted PRP July 11, 2020  Past Medical History:  Diagnosis Date  . Allergy   . Anemia    after birth of child 13yrs ago  . Anxiety and depression    with insomnia  . Blood transfusion without reported diagnosis    x3  . Chronic back pain    ? RA, ? Lupus - reports saw rheumatologist in the past but better now, sees Carol [name]  . Eczema   . GERD (gastroesophageal reflux disease)    takes  Omeprazole daily, with nausea  . Heart murmur   . Hemorrhoids   . History of kidney stones    passed on her own  . History of migraine    last one [name] yr ago and takes Zomig prn  . Hot flashes 11/10/2014  . HTN (hypertension) 03/31/2012  . Hx of echocardiogram    Echo (8/15):  EF 55-60%, Carol [name]  . IBS (irritable bowel syndrome)    constipation predominant  . Interstitial cystitis    hx of UTI  . Numbness    both legs and related to back  . Osteoporosis    osteopenia  . Palpitations    on metoprolol in past but made BP too low  . Pneumonia    hx of;last time 75yrs ago  . PONV (postoperative nausea and vomiting)    laryngospasm-1999  . Poor vision 10/15/2015   -? Ocular rosacea -sees opthomology   . Raynaud's disease /phenomenon 10/21/2012   [name] meds, worse in the winter or cold environment.   . Rheumatoid aortitis    uses Diclofenac gel;Rheumatoid  . Seasonal allergies    Flonase daily   . Urinary incontinence    Past Surgical History:  Procedure Laterality Date  . ABDOMINAL HYSTERECTOMY  1997  . BREAST BIOPSY  1999   benign  . COLONOSCOPY  02/11/2013 and 12/24/04   internal and external hemorrhoids  . ENDOMETRIAL ABLATION    . EXCISION MORTON'S  NEUROMA Right   . FLEXIBLE SIGMOIDOSCOPY  03/07/2008   internal and external hemorrhoids  . POSTERIOR LUMBAR FUSION  05/02/2013   L4-L5 fusion (cage/screws/bone graft) Dr. Annette Stable  . TONSILLECTOMY    . UPPER GASTROINTESTINAL ENDOSCOPY  10/23/2010   gastritis, irregular Z-line  . wisdom teeth extracted     Social History   Socioeconomic History  . Marital status: Married    Spouse name: Not on file  . Number of children: Not on file  . Years of education: Not on file  . Highest education level: Not on file  Occupational History  . Occupation: Programmer, multimedia: [employer]  Tobacco Use  . Smoking status: Never Smoker  . Smokeless tobacco: Never Used  Vaping Use  . Vaping Use: Never used  Substance and Sexual Activity  .  Alcohol use: Yes    Alcohol/week: 2.0 standard drinks    Types: 1 Shots of liquor, 1 Standard drinks or equivalent per week    Comment: one margarita [name] week  . Drug use: [name]  . Sexual activity: Not Currently    Partners: Male  Other Topics Concern  . Not on file  Social History Narrative   Spiritual Beliefs: methodist   Marital status/children/pets: In second marriage.  First husband committed suicide.  2 children.   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 Aspartame And Phenylalanine Other (See Comments)    Migraines  . Beef-Derived Products Other (See Comments)    severe cramping  . Citrus Other (See Comments)    Causes Interstitial cystitis flares  . Penicillins Hives    Denies airway involvement Has patient had [name] PCN reaction causing immediate rash, facial/tongue/throat swelling, SOB or lightheadedness with hypotension: yes hives.  Has patient had [name] PCN reaction causing severe rash involving mucus membranes or skin necrosis:[name] Has patient had [name] PCN reaction that required hospitalization [name] Has patient had [name] PCN reaction occurring within the last 10 years: [name] If all of the above answers are "[name]", then may proceed with Cephalosporin use.   . Promethazine Hcl Other (See Comments)    Muscle twitching and contracture   . Adhesive [Tape]     Red splotches  . Beta Vulgaris   . Promethazine   . Doxycycline Nausea And Vomiting  . Morphine Sulfate Nausea And Vomiting   Family History  Problem Relation Age of Onset  . Coronary artery  disease Father   . Heart attack Father   . Hypertension Father   . Depression Father   . Hyperlipidemia Father   . Alcohol abuse Father   . Early death Father   . Kidney disease Father   . Mental illness Father   . Diverticulosis Mother   . Hypertension Mother   . Osteoarthritis Mother   . Depression Mother   . Hyperlipidemia Mother   . Kidney disease Mother   . Alcohol abuse  Sister   . Depression Sister   . Diabetes Paternal Uncle   . Hypertension Maternal Grandmother   . Stroke Maternal Grandmother   . Hypertension Maternal Grandfather   . Pancreatic cancer Maternal Grandfather   . Early death Maternal Grandfather   . Stroke Paternal Grandfather   . Hyperlipidemia Paternal Grandfather   . Alcohol abuse Paternal Grandfather   . Early death Paternal Grandfather   . Hypertension Paternal Grandfather   . Depression Paternal Grandmother   . Dementia Paternal Grandmother   . Arthritis Paternal Grandmother   . Mental illness Paternal Grandmother   . Alcohol abuse Daughter   . Depression Daughter   . Drug abuse Daughter   . Alcohol abuse Son   . Depression Son   . Colon cancer Neg Hx     Current Outpatient Medications (Endocrine & Metabolic):  .  estradiol (VIVELLE-DOT) 0.075 MG/24HR, Place 1 patch onto the skin 2 (two) times [name] week. Wednesday and Sunday  Current Outpatient Medications (Cardiovascular):  .  atenolol (TENORMIN) 25 MG tablet, Take 0.5 tablets (12.5 mg total) by mouth daily.  Current Outpatient Medications (Respiratory):  .  fluticasone (FLONASE) 50 MCG/ACT nasal spray, Place 2 sprays into the nose daily.  Current Outpatient Medications (Analgesics):  .  meloxicam (MOBIC) 15 MG tablet, Take 1 tablet (15 mg total) by mouth daily.  Current Outpatient Medications (Hematological):  .  cyanocobalamin (,VITAMIN B-12,) 1000 MCG/ML injection, Inject 1 mL (1,000 mcg total) into the muscle every 14 (fourteen) days.  Current Outpatient Medications (Other):  Marland Kitchen   ALPRAZolam (XANAX) 0.5 MG tablet, Take 1 tablet (0.5 mg total) by mouth 3 (three) times daily as needed for anxiety. .  B-D 3CC LUER-LOK SYR 25GX1" 25G X 1" 3 ML MISC, USE WITH CYANOCOBALAMIN EVERY 14 DAYS .  cholecalciferol (VITAMIN D) 1000 UNITS tablet, Take 1,000 Units by mouth every evening. .  clobetasol (TEMOVATE) 0.05 % external solution, APPLY 1 APPLICATION TOPICALLY DAILY AS NEEDED FOR DERMATITIS .  fluorometholone (FML) 0.1 % ophthalmic suspension, 1 drop 3 (three) times daily. Marland Kitchen  FLUoxetine (PROZAC) 40 MG capsule, Take 1 capsule (40 mg total) by mouth daily after breakfast. .  gabapentin (NEURONTIN) 100 MG capsule, Take 1 capsule (100 mg total) by mouth at bedtime. .  hydrocortisone (ANUSOL-HC) 2.5 % rectal cream, Place 1 application rectally 2 (two) times daily. Marland Kitchen  lamoTRIgine (LAMICTAL) 200 MG tablet, Take 1 tablet (200 mg total) by mouth at bedtime. .  Olopatadine HCl 0.2 % SOLN,  .  omeprazole (PRILOSEC) 40 MG capsule, Take 1 capsule (40 mg total) by mouth daily before breakfast. .  PAZEO 0.7 % SOLN,  .  sucralfate (CARAFATE) 1 GM/10ML suspension, TAKE 10 MLS BY MOUTH 3 TIMES DAILY AS NEEDED FOR DIGESTION .  trimethoprim-polymyxin b (POLYTRIM) ophthalmic solution, Place 1 drop into the left eye every 6 (six) hours.   Reviewed prior external information including notes and imaging from  primary care provider As well as notes that were available from care everywhere and other healthcare systems.  Past medical history, social, surgical and family history all reviewed in electronic medical record.  [name] pertanent information unless stated regarding to the chief complaint.   Review of Systems:  [name] headache, visual changes, nausea, vomiting, diarrhea, constipation, dizziness, abdominal pain, skin rash, fevers, chills, night sweats, weight loss, swollen lymph nodes, chest pain, shortness of breath, mood changes. POSITIVE muscle aches, body aches, joint swelling  Objective  Blood  pressure 104/68, pulse 71, height 5'  2" (1.575 m), weight 122 lb (55.3 kg), SpO2 98 %.   General: [name] apparent distress alert and oriented x3 mood and affect normal, dressed appropriately.  HEENT: Pupils equal, extraocular movements intact  Respiratory: Patient's speak in full sentences and does not appear short of breath  Cardiovascular: [name] lower extremity edema, non tender, [name] erythema  Gait normal with good balance and coordination.  MSK:   Right hand exam shows the patient does have less effusion noted of the [facility] joint.  Still tender to palpation.  Good range of motion though.  Good grip strength.  Neurovascularly intact distally.  Left hand exam shows the patient does have [name] mild positive Tinel's.  Mild arthritic changes of the CMC joint.  Good grip strength as well  Limited musculoskeletal ultrasound was performed and interpreted by Lyndal Pulley  Limited ultrasound of patient's right [facility] joint shows that patient does have mild effusion still noted but improved from previous exam.  Underlying arthritic changes still noted. 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

## 2020-08-06 NOTE — Assessment & Plan Note (Signed)
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  Is supposed to be seeing her rheumatologist.  Encouraged her to follow-up with them and discussed the potential for other Biologics if patient did not respond to the Humira.  I do believe that some of patient's arthritis is a little advanced for her age.

## 2020-08-06 NOTE — Assessment & Plan Note (Signed)
Patient does have decreased swelling noted on the side.  Patient does have the arthritic changes.  We discussed advanced imaging which patient declined patient have to want to wait on any type of advanced imaging at this point.  We will get another 6 weeks and follow-up at that time

## 2020-08-06 NOTE — Patient Instructions (Signed)
Carpal tunnel exercises Gabapentin 100mg  at night See me in 5-6 weeks, if not better will do EMG

## 2020-08-06 NOTE — Assessment & Plan Note (Signed)
Patient is having more left hand pain.  Seems to be more associated with potential carpal tunnel.  Patient will do bracing, home exercises, icing regimen.  Patient will be started on a low-dose of gabapentin.  Depending on how patient responds we can increase that.  If patient continues to have trouble we will consider the possibility of nerve conduction test.  Patient does give history of a potential autoimmune disease and does see rheumatology.  Encouraged her to follow-up with them as well

## 2020-08-22 MED FILL — SUBVENITE 200 MG TABS: 200 | 90 days supply | Qty: 90 | Fill #1

## 2020-08-22 MED FILL — ATENOLOL 25 MG TABLET: 25 | 90 days supply | Qty: 45 | Fill #0

## 2020-08-30 MED FILL — BD 3 ML SYRINGE 25GX1: 25G X 1" | 14 days supply | Qty: 2 | Fill #4

## 2020-08-30 MED FILL — CYANOCOBALAMIN 1,000 MCG/ML: 1000 | 28 days supply | Qty: 2 | Fill #6

## 2020-09-17 ENCOUNTER — Ambulatory Visit: Payer: 59 | Admitting: Adult Health

## 2020-09-18 ENCOUNTER — Telehealth: Payer: Self-pay | Admitting: Adult Health

## 2020-09-18 ENCOUNTER — Other Ambulatory Visit: Payer: Self-pay | Admitting: Adult Health

## 2020-09-18 ENCOUNTER — Ambulatory Visit: Payer: 59 | Admitting: Psychiatry

## 2020-09-18 ENCOUNTER — Other Ambulatory Visit: Payer: Self-pay

## 2020-09-18 ENCOUNTER — Ambulatory Visit: Payer: 59 | Admitting: Family Medicine

## 2020-09-18 MED ORDER — MIRTAZAPINE 15 MG PO TABS: ORAL_TABLET | ORAL | 0 refills | Status: DC

## 2020-09-18 MED FILL — MIRTAZAPINE 15 MG TABS: 15 | 30 days supply | Qty: 30 | Fill #0

## 2020-09-18 NOTE — Telephone Encounter (Signed)
Pt missed apt 3/7. Called to [facility]. Transfer from [place]. Extended apt needed. First available 4/6. Pt having hard time sleeping. Currently taking Xanax , Prozax and Lamictal. Don't want to increase Xanax. Recalls taking Remeron in the past for sleep. Asking for Rx of Remeron to [place] [facility] if possible. Paper chart in box outside your door.

## 2020-09-18 NOTE — Telephone Encounter (Signed)
Rtc to patient but had to leave voicemail. Informed her we would send Rx of Remeron to [facility] and to call me back with questions.   I will send in Rx.

## 2020-09-18 NOTE — Telephone Encounter (Signed)
Noted  

## 2020-09-18 NOTE — Telephone Encounter (Signed)
Can we follow up with her. Ok to send Remeron if previously effective.

## 2020-09-24 ENCOUNTER — Other Ambulatory Visit: Payer: Self-pay

## 2020-09-24 ENCOUNTER — Ambulatory Visit (INDEPENDENT_AMBULATORY_CARE_PROVIDER_SITE_OTHER): Payer: 59 | Admitting: Psychiatry

## 2020-09-24 DIAGNOSIS — F331 Major depressive disorder, recurrent, moderate: Secondary | ICD-10-CM

## 2020-09-24 NOTE — Progress Notes (Signed)
      [facility] Counselor/Therapist Progress Note  Patient ID: [name], MRN: [id number],    Date: 09/24/2020  Time Spent: 60 minutes   10:00am to 11:00am  Treatment Type: Individual Therapy  Reported Symptoms: anxiety, depression, "husband in remission and is very difficult to communicate with", frustrated  Mental Status Exam:  Appearance:   Neat     Behavior:  Appropriate, Sharing and Motivated  Motor:  Normal  Speech/Language:   Clear and Coherent  Affect:  anxious, frustrated  Mood:  anxious and depressed  Thought process:  goal directed  Thought content:    WNL  Sensory/Perceptual disturbances:    WNL  Orientation:  oriented to person, place, time/date, situation, day of week, month of year and year  Attention:  Good  Concentration:  Good  Memory:  WNL  Fund of knowledge:   Good  Insight:    Good  Judgment:   Good  Impulse Control:  Good   Risk Assessment: Danger to Self:  No Self-injurious Behavior: No Danger to Others: No Duty to Warn:no Physical Aggression / Violence:No  Access to Firearms a concern: No  Gang Involvement:No   Subjective: Patient today reports anxiety, depression, and frustration, a lot of it related to husband and their relationship, and work stress.  Interventions: Cognitive Behavioral Therapy and Solution-Oriented/Positive Psychology  Diagnosis:   ICD-10-CM   1. 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 Progress will be noted each session and documented in "Progress" section of treatment plan.  Long term goal: Develop healthy cognitive patterns and beliefs about self and the world that lead to alleviation of depression and anxiety and help prevent relapse.  Progressing:  Patient is motivated. Struggling to focus some due to immediate,  unexpected health concern with husband.  Short term goal: Learn and implement personal skills for managing stress, solving daily problems, and resolving conflicts effectively.  Strategy: Teach patient calming skills, problem solving skills, and conflict resolution skills, to better manage daily stressors.  Progressing: Patient in today reporting anxiety, frustration, and depression mostly regarding her relationship with husband who is in remission from cancer.  Has not been seen in several months as she had improved regarding some other issues at that time.  Mor concerns recently in her relationships with husband. "Really wearing me down as his negativity, no affection at all, and feeling like she is trapped again like she was with first husband." Husband very negative and difficult to deal with, and she feels some of it is definitely related to his cancer. Patient able to share and process and lot of her thoughts/feelings/anxieties, fears, uncertainties, as well as some hope. Feels overwhelmed.  Shared how communication with husband is very difficult and usually ends up in conflict. Discussed her feelings and thoughts at length and especially some new ways to change her communication with husband that might work better for their relationship.  Practice these before session end and is to work on this more between sessions. It has helped her to go to 3-day work week.   Goal review and progress/challenges noted with patient.  Next appointment within 2 to 3 weeks.  Shanon Ace, LCSW

## 2020-10-01 MED FILL — BD 3 ML SYRINGE 25GX1: 25G X 1" | 14 days supply | Qty: 2 | Fill #5

## 2020-10-01 MED FILL — CYANOCOBALAMIN 1,000 MCG/ML: 1000 | 28 days supply | Qty: 2 | Fill #7

## 2020-10-05 ENCOUNTER — Telehealth: Payer: Self-pay | Admitting: Adult Health

## 2020-10-05 ENCOUNTER — Other Ambulatory Visit (HOSPITAL_BASED_OUTPATIENT_CLINIC_OR_DEPARTMENT_OTHER): Payer: Self-pay

## 2020-10-05 ENCOUNTER — Other Ambulatory Visit: Payer: Self-pay

## 2020-10-05 DIAGNOSIS — F4310 Post-traumatic stress disorder, unspecified: Secondary | ICD-10-CM

## 2020-10-05 MED ORDER — LAMOTRIGINE 200 MG PO TABS: 200.0000 mg | ORAL_TABLET | Freq: Every day | ORAL | 0 refills | Status: DC

## 2020-10-05 NOTE — Telephone Encounter (Signed)
Do you want me to submit with Dr. Casimiro Needle name instead?

## 2020-10-05 NOTE — Telephone Encounter (Signed)
Yes, please.

## 2020-10-05 NOTE — Telephone Encounter (Signed)
thanks

## 2020-10-05 NOTE — Telephone Encounter (Signed)
Rx sent 

## 2020-10-05 NOTE — Telephone Encounter (Signed)
Next visit is 10/17/20. Transfer from Dr. Creig Hines. [name] is currently in [facility] visiting her mom. She left her Lamotrigine at home and just needs about 2 weeks worth. Pharmacy is [facility], [address] [place] [address]. Phone number is [phone]. They accept electronic refills at this location.

## 2020-10-17 ENCOUNTER — Ambulatory Visit (INDEPENDENT_AMBULATORY_CARE_PROVIDER_SITE_OTHER): Payer: 59 | Admitting: Psychiatry

## 2020-10-17 ENCOUNTER — Other Ambulatory Visit: Payer: Self-pay

## 2020-10-17 ENCOUNTER — Other Ambulatory Visit (HOSPITAL_COMMUNITY): Payer: Self-pay

## 2020-10-17 ENCOUNTER — Encounter: Payer: Self-pay | Admitting: Adult Health

## 2020-10-17 ENCOUNTER — Ambulatory Visit (INDEPENDENT_AMBULATORY_CARE_PROVIDER_SITE_OTHER): Payer: 59 | Admitting: Adult Health

## 2020-10-17 DIAGNOSIS — F411 Generalized anxiety disorder: Secondary | ICD-10-CM

## 2020-10-17 DIAGNOSIS — F4310 Post-traumatic stress disorder, unspecified: Secondary | ICD-10-CM

## 2020-10-17 DIAGNOSIS — F410 Panic disorder [episodic paroxysmal anxiety] without agoraphobia: Secondary | ICD-10-CM | POA: Diagnosis not present

## 2020-10-17 DIAGNOSIS — F331 Major depressive disorder, recurrent, moderate: Secondary | ICD-10-CM

## 2020-10-17 DIAGNOSIS — G4700 Insomnia, unspecified: Secondary | ICD-10-CM

## 2020-10-17 MED ORDER — FLUOXETINE HCL 40 MG PO CAPS
ORAL_CAPSULE | ORAL | 1 refills | Status: DC
  Filled 2020-10-17: qty 90, 90d supply, fill #0
  Filled 2021-02-06: qty 90, 90d supply, fill #1

## 2020-10-17 MED ORDER — TRAZODONE HCL 50 MG PO TABS
50.0000 mg | ORAL_TABLET | Freq: Every day | ORAL | 1 refills | Status: DC
  Filled 2020-10-17: qty 90, 90d supply, fill #0

## 2020-10-17 MED ORDER — ALPRAZOLAM 0.5 MG PO TABS
ORAL_TABLET | Freq: Three times a day (TID) | ORAL | 1 refills | Status: DC | PRN
  Filled 2020-10-17: qty 270, 90d supply, fill #0
  Filled 2021-02-06: qty 270, 90d supply, fill #1

## 2020-10-17 MED ORDER — LAMOTRIGINE 200 MG PO TABS
200.0000 mg | ORAL_TABLET | Freq: Every day | ORAL | 3 refills | Status: DC
  Filled 2020-10-17: qty 90, 90d supply, fill #0
  Filled 2021-02-06: qty 90, 90d supply, fill #1
  Filled 2021-06-10: qty 90, 90d supply, fill #2

## 2020-10-17 NOTE — Progress Notes (Signed)
[name] [id number] 1956-05-06 66 y.o.  Subjective:   Patient ID:  [name] is a 65 y.o. (DOB 1955/09/28) female.  Chief Complaint: No chief complaint on file.   HPI [name] presents to the office today for follow-up of MDD, GAD, panic disorder, and PTSD.  Describes mood today as "not the best". Pleasant. Mood symptoms - reports depression - "getting worse", anxiety - "always", and irritability "3/4 of the time". Struggling in marriage - "no intimacy". Husband - prostate cancer - "refusing to have anything to do with me". Married for 16 years. Decreased interest and motivation. Seeing therapist - Rockne Menghini. Taking medications as prescribed.  Energy levels lower overall. Active, does not have a regular exercise routine. Riding bike 90 miles a week - makes me feel better" Enjoys some usual interests and activities. Married. Lives with husband of 16 years. Mother lives in [place]. Spending time with family. Appetite adequate. Weight gain - 5 pounds. Sleeping difficulties - mind going - "I have issues". Averages 5 to 6 hours. Focus and concentration stable. Completing tasks. Managing aspects of household. Works full time. Denies SI or HI.  Denies AH or VH.  Previous medication trials: Remeron   GAD-7   Flowsheet Row Office Visit from 04/27/2019 in [facility] Visit from 12/29/2018 in [facility]  Total GAD-7 Score 11 18    Mini-Mental   Flowsheet Row Office Visit from 12/22/2016 in [facility]  Total Score (max 30 points ) 30    PHQ2-9   Flowsheet Row Office Visit from 05/23/2020 in [facility] Visit from 04[facility]27/2019 in [facility] Visit from 12/29/2018 in [facility] Visit from 04/20/2018 in [facility] Visit from 04/09/2017 in [facility] Total Score 0 0  5 0 0  PHQ-9 Total Score -- -- 17 4 2        Review of Systems:  Review of Systems  Musculoskeletal: Negative for gait problem.  Neurological: Negative for tremors.  Psychiatric/Behavioral:       Please refer to HPI    Medications: I have reviewed the patient's current medications.  Current Outpatient Medications  Medication Sig Dispense Refill  . traZODone (DESYREL) 50 MG tablet Take 1 tablet (50 mg total) by mouth at bedtime. 90 tablet 1  . ALPRAZolam (XANAX) 0.5 MG tablet TAKE 1 TABLET BY MOUTH 3 TIMES DAILY AS NEEDED FOR ANXIETY 270 tablet 1  . atenolol (TENORMIN) 25 MG tablet TAKE 1/2 TABLET (12.5 MG TOTAL) BY MOUTH DAILY. 45 tablet 1  . B-D 3CC LUER-LOK SYR 25GX1" 25G X 1" 3 ML MISC USE WITH CYANOCOBALAMIN EVERY 14 DAYS    . cholecalciferol (VITAMIN D) 1000 UNITS tablet Take 1,000 Units by mouth every evening.    . clobetasol (TEMOVATE) 0.05 % external solution APPLY TOPICALLY ONCE DAILY AS NEEDED FOR DERMATITIS 50 mL 2  . cyanocobalamin (,VITAMIN B-12,) 1000 MCG/ML injection INJECT 1 ML INTO THE MUSCLE EVERY 14 DAYS 2 mL 11  . estradiol (ESTRACE) 0.1 MG/GM vaginal cream INSERT 1 GRAM VAGINALLY 2-3 TIMES PER WEEK 42.5 g 8  . estradiol (VIVELLE-DOT) 0.075 MG/24HR Place 1 patch onto the skin 2 (two) times a week. Wednesday and Sunday    . fluorometholone (FML) 0.1 % ophthalmic suspension 1 drop 3 (three) times daily.    Marland Kitchen FLUoxetine (PROZAC) 40  MG capsule TAKE 1 CAPSULE BY MOUTH ONCE DAILY AFTER BREAKFAST 90 capsule 1  . fluticasone (FLONASE) 50 MCG/ACT nasal spray Place 2 sprays into the nose daily. 16 g 5  . gabapentin (NEURONTIN) 100 MG capsule TAKE 1 CAPSULE BY MOUTH AT BEDTIME 90 capsule 0  . hydrocortisone (ANUSOL-HC) 2.5 % rectal cream Place 1 application rectally 2 (two) times daily. 30 g 1  . lamoTRIgine (LAMICTAL) 200 MG tablet Take 1 tablet (200 mg total) by mouth at bedtime. 90 tablet 3  . meloxicam (MOBIC) 15 MG tablet TAKE 1 TABLET (15 MG TOTAL) BY MOUTH DAILY. 30  tablet 5  . METRONIDAZOLE, TOPICAL, 0.75 % LOTN USE EXTERNALLY ONCE DAILY AS DIRECTED 59 mL 3  . Olopatadine HCl 0.2 % SOLN     . omeprazole (PRILOSEC) 40 MG capsule Take 1 capsule (40 mg total) by mouth daily before breakfast. 90 capsule 0  . PAZEO 0.7 % SOLN   98  . sucralfate (CARAFATE) 1 GM/10ML suspension TAKE 10 MLS BY MOUTH 3 TIMES DAILY AS NEEDED FOR DIGESTION 840 mL 2  . SYRINGE-NEEDLE, DISP, 3 ML 25G X 1" 3 ML MISC USE WITH CYANOCOBALAMIN EVERY 14 DAYS 2 each 11  . trimethoprim-polymyxin b (POLYTRIM) ophthalmic solution PLACE 1 DROP INTO THE LEFT EYE EVERY 6 HOURS. 10 mL 0   No current facility-administered medications for this visit.    Medication Side Effects: None  Allergies:  Allergies  Allergen Reactions  . Aspartame And Phenylalanine Other (See Comments)    Migraines  . Beef-Derived Products Other (See Comments)    severe cramping  . Citrus Other (See Comments)    Causes Interstitial cystitis flares  . Penicillins Hives    Denies airway involvement Has patient had a PCN reaction causing immediate rash, facial/tongue/throat swelling, SOB or lightheadedness with hypotension: yes hives.  Has patient had a PCN reaction causing severe rash involving mucus membranes or skin necrosis:no Has patient had a PCN reaction that required hospitalization No Has patient had a PCN reaction occurring within the last 10 years: No If all of the above answers are "NO", then may proceed with Cephalosporin use.   . Promethazine Hcl Other (See Comments)    Muscle twitching and contracture   . Adhesive [Tape]     Red splotches  . Beta Vulgaris   . Promethazine   . Doxycycline Nausea And Vomiting  . Morphine Sulfate Nausea And Vomiting    Past Medical History:  Diagnosis Date  . Allergy   . Anemia    after birth of child 56yrs ago  . Anxiety and depression    with insomnia  . Blood transfusion without reported diagnosis    x3  . Chronic back pain    ? RA, ? Lupus - reports  saw rheumatologist in the past but better now, sees Dr. Dutch Quint  . Eczema   . GERD (gastroesophageal reflux disease)    takes Omeprazole daily, with nausea  . Heart murmur   . Hemorrhoids   . History of kidney stones    passed on her own  . History of migraine    last one a yr ago and takes Zomig prn  . Hot flashes 11/10/2014  . HTN (hypertension) 03/31/2012  . Hx of echocardiogram    Echo (8/15):  EF 55-60%, no RWMA  . IBS (irritable bowel syndrome)    constipation predominant  . Interstitial cystitis    hx of UTI  . Numbness    both legs and  related to back  . Osteoporosis    osteopenia  . Palpitations    on metoprolol in past but made BP too low  . Pneumonia    hx of;last time 2yrs ago  . PONV (postoperative nausea and vomiting)    laryngospasm-1999  . Poor vision 10/15/2015   -? Ocular rosacea -sees opthomology   . Raynaud's disease /phenomenon 10/21/2012   no meds, worse in the winter or cold environment.   . Rheumatoid aortitis    uses Diclofenac gel;Rheumatoid  . Seasonal allergies    Flonase daily   . Urinary incontinence     Family History  Problem Relation Age of Onset  . Coronary artery disease Father   . Heart attack Father   . Hypertension Father   . Depression Father   . Hyperlipidemia Father   . Alcohol abuse Father   . Early death Father   . Kidney disease Father   . Mental illness Father   . Diverticulosis Mother   . Hypertension Mother   . Osteoarthritis Mother   . Depression Mother   . Hyperlipidemia Mother   . Kidney disease Mother   . Alcohol abuse Sister   . Depression Sister   . Diabetes Paternal Uncle   . Hypertension Maternal Grandmother   . Stroke Maternal Grandmother   . Hypertension Maternal Grandfather   . Pancreatic cancer Maternal Grandfather   . Early death Maternal Grandfather   . Stroke Paternal Grandfather   . Hyperlipidemia Paternal Grandfather   . Alcohol abuse Paternal Grandfather   . Early death Paternal Grandfather    . Hypertension Paternal Grandfather   . Depression Paternal Grandmother   . Dementia Paternal Grandmother   . Arthritis Paternal Grandmother   . Mental illness Paternal Grandmother   . Alcohol abuse Daughter   . Depression Daughter   . Drug abuse Daughter   . Alcohol abuse Son   . Depression Son   . Colon cancer Neg Hx     Social History   Socioeconomic History  . Marital status: Married    Spouse name: Not on file  . Number of children: Not on file  . Years of education: Not on file  . Highest education level: Not on file  Occupational History  . Occupation: Teacher, adult education: [employer]  Tobacco Use  . Smoking status: Never Smoker  . Smokeless tobacco: Never Used  Vaping Use  . Vaping Use: Never used  Substance and Sexual Activity  . Alcohol use: Yes    Alcohol/week: 2.0 standard drinks    Types: 1 Shots of liquor, 1 Standard drinks or equivalent per week    Comment: one margarita a week  . Drug use: No  . Sexual activity: Not Currently    Partners: Male  Other Topics Concern  . Not on file  Social History Narrative   Spiritual Beliefs: methodist   Marital status/children/pets: In second marriage.  First husband committed suicide.  2 children.   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   Please see review of systems for further details on the patient's review from today.   Objective:    Physical Exam:  There were no vitals taken for this visit.  Physical Exam Constitutional:      General: She is not in acute distress. Musculoskeletal:        General: No deformity.  Neurological:     Mental Status: She is alert and oriented to person, place, and time.     Coordination: Coordination normal.  Psychiatric:        Attention and Perception: Attention and perception normal. She does not perceive auditory or visual hallucinations.        Mood and Affect: Mood normal. Mood is not anxious or depressed. Affect is not labile, blunt, angry or inappropriate.        Speech: Speech normal.        Behavior: Behavior normal.        Thought Content: Thought content normal. Thought content is not paranoid or delusional. Thought content does not include homicidal or suicidal ideation. Thought content does not include homicidal or suicidal plan.        Cognition and Memory: Cognition and memory normal.        Judgment: Judgment normal.     Comments: Insight intact     Lab Review:     Component Value Date/Time   NA 139 05/23/2020 1011   NA 143 12/22/2016 0948   K 5.4 No hemolysis seen (H) 05/23/2020 1011   CL 102 05/23/2020 1011   CO2 33 (H) 05/23/2020 1011   GLUCOSE 81 05/23/2020 1011   GLUCOSE 95 04/21/2006 1635   BUN 14 05/23/2020 1011   BUN 17 12/22/2016 0948   CREATININE 1.04 05/23/2020 1011   CALCIUM 9.4 05/23/2020 1011   PROT 6.4 05/23/2020 1011   PROT 7.0 12/22/2016 0948   ALBUMIN 4.4 05/23/2020 1011   ALBUMIN 4.7 12/22/2016 0948   AST 21 05/23/2020 1011   ALT 14 05/23/2020 1011   ALKPHOS 70 05/23/2020 1011   BILITOT 0.5 05/23/2020 1011   BILITOT 0.3 12/22/2016 0948   GFRNONAA 65 12/22/2016 0948   GFRAA 75 12/22/2016 0948       Component Value Date/Time   WBC 4.0 05/23/2020 1011   RBC 4.60 05/23/2020 1011   HGB 14.0 05/23/2020 1011   HGB 14.1 12/22/2016 0948   HCT 42.8 05/23/2020 1011   HCT 44.0 12/22/2016 0948   PLT 181.0 05/23/2020 1011   PLT 206  12/22/2016 0948   MCV 93.1 05/23/2020 1011   MCV 94 12/22/2016 0948   MCH 30.3 12/22/2016 0948   MCH 30.7 11/01/2015 1154   MCHC 32.6 05/23/2020 1011   RDW 13.7 05/23/2020 1011   RDW 14.3 12/22/2016 0948   LYMPHSABS 1.0 04/27/2019 1010   LYMPHSABS 0.9 12/22/2016 0948   MONOABS 0.4 04/27/2019 1010   EOSABS 0.2 04/27/2019 1010   EOSABS 0.1 12/22/2016 0948   BASOSABS 0.0 04/27/2019 1010   BASOSABS 0.0 12/22/2016 0948    No results found for: POCLITH, LITHIUM   No results found for: PHENYTOIN, PHENOBARB, VALPROATE, CBMZ   .res Assessment: Plan:    Plan:  PDMP reviewed  1. Xanax 0.5mg  TID  2. Lamictal 200mg  daily 3. Prozac 40mg   daily  4. Add Trazdone 25mg  at hs  Read and reviewed note with patient for accuracy.   RTC 4 weeks  Patient advised to contact office with any questions, adverse effects, or acute worsening in signs and symptoms.  Diagnoses and all orders for this visit:  Generalized anxiety disorder  PTSD (post-traumatic stress disorder) -     lamoTRIgine (LAMICTAL) 200 MG tablet; Take 1 tablet (200 mg total) by mouth at bedtime. -     ALPRAZolam (XANAX) 0.5 MG tablet; TAKE 1 TABLET BY MOUTH 3 TIMES DAILY AS NEEDED FOR ANXIETY -     FLUoxetine (PROZAC) 40 MG capsule; TAKE 1 CAPSULE BY MOUTH ONCE DAILY AFTER BREAKFAST  Panic disorder -     ALPRAZolam (XANAX) 0.5 MG tablet; TAKE 1 TABLET BY MOUTH 3 TIMES DAILY AS NEEDED FOR ANXIETY -     FLUoxetine (PROZAC) 40 MG capsule; TAKE 1 CAPSULE BY MOUTH ONCE DAILY AFTER BREAKFAST  Moderate recurrent major depression (HCC)  Insomnia, unspecified type -     traZODone (DESYREL) 50 MG tablet; Take 1 tablet (50 mg total) by mouth at bedtime.     Please see After Visit Summary for patient specific instructions.  Future Appointments  Date Time Provider Department Center  10/29/2020  4:00 PM Mathis Fare, LCSW [facility] None  11/19/2020 11:00 AM Mathis Fare, LCSW [facility] None  11/20/2020 11:20 AM Teresa Lemmerman, Thereasa Solo,  NP [facility] None    No orders of the defined types were placed in this encounter.   -------------------------------

## 2020-10-17 NOTE — Progress Notes (Signed)
[facility] Counselor/Therapist Progress Note  Patient ID: [name], MRN: [id number],    Date: 10/17/2020  Time Spent: 60 minutes     4:35pm to 5:35pm  Treatment Type: Individual Therapy  Reported Symptoms: Anxiety, frustration, depression, anger, intolerance  Mental Status Exam:  Appearance:   Casual     Behavior:  Appropriate, Sharing and Motivated  Motor:  Normal  Speech/Language:   Clear and Coherent  Affect:  anxious, depressed, angry, frustrated  Mood:  angry, anxious and depressed  Thought process:  goal directed  Thought content:    WNL  Sensory/Perceptual disturbances:    WNL  Orientation:  oriented to person, place, time/date, situation, day of week, month of year and year  Attention:  Good  Concentration:  Good  Memory:  WNL  Fund of knowledge:   Good  Insight:    Good  Judgment:   Good  Impulse Control:  Good and Fair   Risk Assessment: Danger to Self:  No Self-injurious Behavior: No Danger to Others: No Duty to Warn:no Physical Aggression / Violence:No  Access to Firearms a concern: No  Gang Involvement:No   Subjective: Patient today reporting anxiety, frustration, depression, mostly related to personal and health issues with her husband who is a cancer patient. "I feel like I'm invisible at home."(See Progress Note below).  Interventions: Cognitive Behavioral Therapy and Ego-Supportive  Diagnosis:   ICD-10-CM   1. Generalized anxiety disorder  F41.1      Plan: Patient not signing the tx plan on computer screen due to COVID  Treatment Goals: Goals remain on treatment plan as patient works on strategies to meet her goals. Progress will be noted each session and documented in "Progress" section of treatment plan.  Long term goal: Develop healthy cognitive patterns and beliefs about self and the world that lead to alleviation of depression and anxiety and help prevent relapse.  Progressing:  Patient is motivated. Struggling to  focus some due to immediate, unexpected health concern with husband.  Short term goal: Learn and implement personal skills for managing stress, solving daily problems, and resolving conflicts effectively.  Strategy: Teach patient calming skills, problem solving skills, and conflict resolution skills, to better manage daily stressors.  Progressing: Patient in today reporting daily anxiety, frustration,angry, and depression mostly due to personal and health issues with her husband who is a cancer patient. States she feels invisible at home.  Feels that she is doing okay on her job but it is a struggle.  Doesn't feel any attachment with husband, some of which could be related to husband's cancer. Husband has been in remission with his cancer and patient has tried to be very understanding but there's no real "relationship between [place]." Very frustrated also with some issues with their pet dogs.  Gave examples of how her frustration was so high that she is probably communicate in ways that is adding to her frustration and increasing anger.  She denies any thoughts to harm self or others.  Overall frustration and needed most of session to vent a lot of what she is feeling and come up with some strategies for better frustration management and communication skills.  Patient did a good job and expressing herself and also being able to see certain issues from a different angle than her own.  Able to consider a little more of what husband may be feeling and thinking, and seem to be more willing to try and speak with him in a way where  there is not a lot of emotion attached and instead she can be calmer and respond in ways that might help with the 2 of them to be able to communicate better, per short-term goal and strategy in her treatment plan above.  Patient was more calm and grounded towards the end of our session as we spoke about some different ways of communicating with husband that might be more successful in  engaging him in a less volatile way.  Admits that she would like for things to be better in the marriage and that "their marriage is really gone downhill over the last 5 years".  Patient added that she may take a day away " just to have some time to think and be".  But does seem committed to wanting to really have a better conversation with husband.  Encouraged patient to have some time to herself if she feels that would be helpful in being able to try to have a meaningful conversation with husband.  Also encouraged her to be outside some every day and continue her exercise or biking, to stay in touch with people that are supportive of her, to stay in the present rather than the future or the past, to practice positive self-care and self talk, to take breaks as needed to experience more calmness, look intentionally for more positives daily, notice when husband talks or acts in more positive ways, and feel good about the strength she is showing in very difficult circumstances to work through her issues so as to move forward in a more positive direction.  Goal review and progress/challenges noted with patient.  Next appointment within 2 to 3 weeks.   Shanon Ace, LCSW

## 2020-10-24 ENCOUNTER — Encounter: Payer: Self-pay | Admitting: Family Medicine

## 2020-10-25 ENCOUNTER — Encounter: Payer: Self-pay | Admitting: Family Medicine

## 2020-10-25 ENCOUNTER — Other Ambulatory Visit: Payer: Self-pay

## 2020-10-25 ENCOUNTER — Ambulatory Visit: Payer: 59 | Admitting: Family Medicine

## 2020-10-25 ENCOUNTER — Other Ambulatory Visit (HOSPITAL_COMMUNITY): Payer: Self-pay

## 2020-10-25 VITALS — BP 94/55 | HR 70 | Temp 98.1°F | Ht 62.0 in | Wt 120.0 lb

## 2020-10-25 DIAGNOSIS — R32 Unspecified urinary incontinence: Secondary | ICD-10-CM

## 2020-10-25 DIAGNOSIS — R350 Frequency of micturition: Secondary | ICD-10-CM

## 2020-10-25 LAB — POCT URINALYSIS DIPSTICK
Bilirubin, UA: NEGATIVE
Blood, UA: NEGATIVE
Glucose, UA: NEGATIVE
Ketones, UA: NEGATIVE
Leukocytes, UA: NEGATIVE
Nitrite, UA: NEGATIVE
Protein, UA: NEGATIVE
Spec Grav, UA: >=1.03 — AB (ref 1.010–1.025)
Urobilinogen, UA: 1 E.U./dL
pH, UA: 6 (ref 5.0–8.0)

## 2020-10-25 MED ORDER — NITROFURANTOIN MONOHYD MACRO 100 MG PO CAPS
100.0000 mg | ORAL_CAPSULE | Freq: Two times a day (BID) | ORAL | 0 refills | Status: DC
  Filled 2020-10-25: qty 10, 5d supply, fill #0

## 2020-10-25 MED FILL — Cyanocobalamin Inj 1000 MCG/ML: INTRAMUSCULAR | 28 days supply | Qty: 2 | Fill #0 | Status: AC

## 2020-10-25 NOTE — Patient Instructions (Signed)
     I have called in Macy every 12 hours for 5 days.

## 2020-10-25 NOTE — Progress Notes (Signed)
This visit occurred during the SARS-CoV-2 public health emergency.  Safety protocols were in place, including screening questions prior to the visit, additional usage of staff PPE, and extensive cleaning of exam room while observing appropriate contact time as indicated for disinfecting solutions.    [name] , 05/15/1956, 65 y.o., female MRN: [id number] Patient Care Team    Relationship Specialty Notifications Start End  Ma Hillock, DO PCP - General Family Medicine  12/29/18   Gavin Pound, MD Consulting Physician Rheumatology  04/09/17   Maisie Fus, MD Consulting Physician Obstetrics and Gynecology  04/09/17   Devra Dopp, MD Referring Physician Dermatology  04/09/17   [facility], [place]  12/29/18    Comment: [facility]    12/29/18   [name], MD Consulting Physician Gastroenterology  12/29/18   Earnie Larsson, MD Consulting Physician Neurosurgery  12/29/18     Chief Complaint  Patient presents with  . Urinary Incontinence    Pt c/o urine incontinence and urgency x 6 days     Subjective: Pt presents for an OV with complaints of urinary frequency, urgency and incontinence secondary to urgency.  She reports her symptoms all started about 6 days ago.  She denies fever, chills, nausea or vomit.  She denies low back pain or abdominal pressure.  She has had a history of urinary tract infections in the past.  She also has had a history of a bacterial cystitis in the past that responded to [place].  Depression screen [facility] 2/9 05/23/2020 04/27/2019 12/29/2018 04/20/2018 04/09/2017  Decreased Interest 0 0 2 0 0  Down, Depressed, Hopeless 0 0 3 0 0  PHQ - 2 Score 0 0 5 0 0  Altered sleeping - - 2 2 2   Tired, decreased energy - - 1 2 0  Change in appetite - - 3 0 0  Feeling bad or failure about yourself  - - 1 0 0  Trouble concentrating - - 2 0 0  Moving slowly or fidgety/restless - - 2 0 0   Suicidal thoughts - - 1 0 0  PHQ-9 Score - - 17 4 2   Difficult doing work/chores - - Extremely dIfficult - -  Some recent data might be hidden    Allergies  Allergen Reactions  . Aspartame And Phenylalanine Other (See Comments)    Migraines  . Beef-Derived Products Other (See Comments)    severe cramping  . Citrus Other (See Comments)    Causes Interstitial cystitis flares  . Penicillins Hives    Denies airway involvement Has patient had a PCN reaction causing immediate rash, facial/tongue/throat swelling, SOB or lightheadedness with hypotension: yes hives.  Has patient had a PCN reaction causing severe rash involving mucus membranes or skin necrosis:no Has patient had a PCN reaction that required hospitalization No Has patient had a PCN reaction occurring within the last 10 years: No If all of the above answers are "NO", then may proceed with Cephalosporin use.   . Promethazine Hcl Other (See Comments)    Muscle twitching and contracture   . Adhesive [Tape]     Red splotches  . Beta Vulgaris   . Promethazine   . Doxycycline Nausea And Vomiting  . Morphine Sulfate Nausea And Vomiting   Social History   Social History Narrative   Spiritual Beliefs: methodist   Marital status/children/pets: In second marriage.  First husband committed suicide.  2 children.  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 Allergy   . Anemia    after birth of child 72yrs ago  . Anxiety and depression    with insomnia  . Blood transfusion without reported diagnosis    x3  . Chronic back pain    ? RA, ? Lupus - reports saw rheumatologist in the past but better now, sees Dr. Trenton Gammon  . Eczema   . GERD (gastroesophageal reflux disease)    takes Omeprazole daily, with  nausea  . Heart murmur   . Hemorrhoids   . History of kidney stones    passed on her own  . History of migraine    last one a yr ago and takes Zomig prn  . Hot flashes 11/10/2014  . HTN (hypertension) 03/31/2012  . Hx of echocardiogram    Echo (8/15):  EF 55-60%, no RWMA  . IBS (irritable bowel syndrome)    constipation predominant  . Interstitial cystitis    hx of UTI  . Numbness    both legs and related to back  . Osteoporosis    osteopenia  . Palpitations    on metoprolol in past but made BP too low  . Pneumonia    hx of;last time 31yrs ago  . PONV (postoperative nausea and vomiting)    laryngospasm-1999  . Poor vision 10/15/2015   -? Ocular rosacea -sees opthomology   . Raynaud's disease /phenomenon 10/21/2012   no meds, worse in the winter or cold environment.   . Rheumatoid aortitis    uses Diclofenac gel;Rheumatoid  . Seasonal allergies    Flonase daily   . Urinary incontinence    Past Surgical History:  Procedure Laterality Date  . ABDOMINAL HYSTERECTOMY  1997  . BREAST BIOPSY  1999   benign  . COLONOSCOPY  02/11/2013 and 12/24/04   internal and external hemorrhoids  . ENDOMETRIAL ABLATION    . EXCISION MORTON'S NEUROMA Right   . FLEXIBLE SIGMOIDOSCOPY  03/07/2008   internal and external hemorrhoids  . POSTERIOR LUMBAR FUSION  05/02/2013   L4-L5 fusion (cage/screws/bone graft) Dr. Annette Stable  . TONSILLECTOMY    . UPPER GASTROINTESTINAL ENDOSCOPY  10/23/2010   gastritis, irregular Z-line  . wisdom teeth extracted     Family History  Problem Relation Age of Onset  . Coronary artery disease Father   . Heart attack Father   . Hypertension Father   . Depression Father   . Hyperlipidemia Father   . Alcohol abuse Father   . Early death Father   . Kidney disease Father   . Mental illness Father   . Diverticulosis Mother   . Hypertension Mother   . Osteoarthritis Mother   . Depression Mother   . Hyperlipidemia Mother   . Kidney disease Mother   . Alcohol abuse  Sister   . Depression Sister   . Diabetes Paternal Uncle   . Hypertension Maternal Grandmother   . Stroke Maternal Grandmother   . Hypertension Maternal Grandfather   . Pancreatic cancer Maternal Grandfather   . Early death Maternal Grandfather   . Stroke Paternal Grandfather   . Hyperlipidemia Paternal Grandfather   .  Alcohol abuse Paternal Grandfather   . Early death Paternal Grandfather   . Hypertension Paternal Grandfather   . Depression Paternal Grandmother   . Dementia Paternal Grandmother   . Arthritis Paternal Grandmother   . Mental illness Paternal Grandmother   . Alcohol abuse Daughter   . Depression Daughter   . Drug abuse Daughter   . Alcohol abuse Son   . Depression Son   . Colon cancer Neg Hx    Allergies as of 10/25/2020      Reactions   Aspartame And Phenylalanine Other (See Comments)   Migraines   Beef-derived Products Other (See Comments)   severe cramping   Citrus Other (See Comments)   Causes Interstitial cystitis flares   Penicillins Hives   Denies airway involvement Has patient had a PCN reaction causing immediate rash, facial/tongue/throat swelling, SOB or lightheadedness with hypotension: yes hives.  Has patient had a PCN reaction causing severe rash involving mucus membranes or skin necrosis:no Has patient had a PCN reaction that required hospitalization No Has patient had a PCN reaction occurring within the last 10 years: No If all of the above answers are "NO", then may proceed with Cephalosporin use.   Promethazine Hcl Other (See Comments)   Muscle twitching and contracture    Adhesive [tape]    Red splotches   Beta Vulgaris    Promethazine    Doxycycline Nausea And Vomiting   Morphine Sulfate Nausea And Vomiting      Medication List       Accurate as of October 25, 2020  1:01 PM. If you have any questions, ask your nurse or doctor.        STOP taking these medications   gabapentin 100 MG capsule Commonly known as: NEURONTIN Stopped  by: Howard Pouch, DO   meloxicam 15 MG tablet Commonly known as: MOBIC Stopped by: Howard Pouch, DO     TAKE these medications   ALPRAZolam 0.5 MG tablet Commonly known as: XANAX TAKE 1 TABLET BY MOUTH 3 TIMES DAILY AS NEEDED FOR ANXIETY   atenolol 25 MG tablet Commonly known as: TENORMIN TAKE 1/2 TABLET (12.5 MG TOTAL) BY MOUTH DAILY.   B-D 3CC LUER-LOK SYR 25GX1" 25G X 1" 3 ML Misc Generic drug: SYRINGE-NEEDLE (DISP) 3 ML USE WITH CYANOCOBALAMIN EVERY 14 DAYS   B-D 3CC LUER-LOK SYR 25GX1" 25G X 1" 3 ML Misc Generic drug: SYRINGE-NEEDLE (DISP) 3 ML USE WITH CYANOCOBALAMIN EVERY 14 DAYS   cholecalciferol 1000 units tablet Commonly known as: VITAMIN D Take 1,000 Units by mouth every evening.   clobetasol 0.05 % external solution Commonly known as: TEMOVATE APPLY TOPICALLY ONCE DAILY AS NEEDED FOR DERMATITIS   cyanocobalamin 1000 MCG/ML injection Commonly known as: (VITAMIN B-12) INJECT 1 ML INTO THE MUSCLE EVERY 14 DAYS   cyclobenzaprine 10 MG tablet Commonly known as: FLEXERIL take 1/2-1 tablet po q 8-12 hours as needed for acute muscle spasms   estradiol 0.075 MG/24HR Commonly known as: VIVELLE-DOT Place 1 patch onto the skin 2 (two) times a week. Wednesday and Sunday   estradiol 0.1 MG/GM vaginal cream Commonly known as: ESTRACE INSERT 1 GRAM VAGINALLY 2-3 TIMES PER WEEK   fluorometholone 0.1 % ophthalmic suspension Commonly known as: FML 1 drop 3 (three) times daily.   FLUoxetine 40 MG capsule Commonly known as: PROZAC TAKE 1 CAPSULE BY MOUTH ONCE DAILY AFTER BREAKFAST   fluticasone 50 MCG/ACT nasal spray Commonly known as: FLONASE Place 2 sprays into the nose daily.   hydrocortisone  2.5 % rectal cream Commonly known as: ANUSOL-HC Place 1 application rectally 2 (two) times daily.   lamoTRIgine 200 MG tablet Commonly known as: LAMICTAL Take 1 tablet (200 mg total) by mouth at bedtime.   METRONIDAZOLE (TOPICAL) 0.75 % Lotn USE EXTERNALLY ONCE  DAILY AS DIRECTED   nitrofurantoin (macrocrystal-monohydrate) 100 MG capsule Commonly known as: Macrobid Take 1 capsule (100 mg total) by mouth 2 (two) times daily. Started by: Howard Pouch, DO   omeprazole 40 MG capsule Commonly known as: PRILOSEC Take 1 capsule (40 mg total) by mouth daily before breakfast. What changed:   when to take this  reasons to take this   Pazeo 0.7 % Soln Generic drug: Olopatadine HCl   Olopatadine HCl 0.2 % Soln   sucralfate 1 GM/10ML suspension Commonly known as: Carafate TAKE 10 MLS BY MOUTH 3 TIMES DAILY AS NEEDED FOR DIGESTION   traZODone 50 MG tablet Commonly known as: DESYREL Take 1 tablet (50 mg total) by mouth at bedtime.   trimethoprim-polymyxin b ophthalmic solution Commonly known as: POLYTRIM PLACE 1 DROP INTO THE LEFT EYE EVERY 6 HOURS.       All past medical history, surgical history, allergies, family history, immunizations andmedications were updated in the EMR today and reviewed under the history and medication portions of their EMR.     ROS: Negative, with the exception of above mentioned in HPI   Objective:  BP (!) 94/55   Pulse 70   Temp 98.1 F (36.7 C) (Oral)   Ht 5\' 2"  (1.575 m)   Wt 120 lb (54.4 kg)   SpO2 98%   BMI 21.95 kg/m  Body mass index is 21.95 kg/m. Gen: Afebrile. No acute distress. Nontoxic in appearance, well developed, well nourished.  HENT: AT. [place].  Eyes:Pupils Equal Round Reactive to light, Extraocular movements intact,  Conjunctiva without redness, discharge or icterus. Abd: Soft. NTND. BS present.  No masses palpated. No rebound or guarding.  MSK: No CVA tenderness bilaterally Skin: No rashes, purpura or petechiae.  Neuro:  Normal gait. PERLA. EOMi. Alert. Oriented x3  Psych: Normal affect, dress and demeanor. Normal speech. Normal thought content and judgment.  No exam data present No results found. Results for orders placed or performed in visit on 10/25/20 (from the past 24 hour(s))   POCT Urinalysis Dipstick     Status: Abnormal   Collection Time: 10/25/20 11:52 AM  Result Value Ref Range   Color, UA yellow    Clarity, UA clear    Glucose, UA Negative Negative   Bilirubin, UA negative    Ketones, UA negative    Spec Grav, UA >=1.030 (A) 1.010 - 1.025   Blood, UA negative    pH, UA 6.0 5.0 - 8.0   Protein, UA Negative Negative   Urobilinogen, UA 1.0 0.2 or 1.0 E.U./dL   Nitrite, UA negative    Leukocytes, UA Negative Negative   Appearance     Odor      Assessment/Plan: [name] is a 65 y.o. female present for OV for  Urinary incontinence, unspecified type/urinary frequency Urine did not appear incredibly infectious today on point-of-care.  Went ahead and treated with Macrobid twice daily x5 days for possible cystitis-like symptoms.  Will send urine culture to be complete.  Patient will be called with urine culture results and antibiotic will be changed if appropriate or needed by culture and sensitivities. - POCT Urinalysis Dipstick - Urinalysis w microscopic + reflex cultur She was encouraged to hydrate. Follow-up  in 2 weeks if symptoms are not resolved.   Reviewed expectations re: course of current medical issues.  Discussed self-management of symptoms.  Outlined signs and symptoms indicating need for more acute intervention.  Patient verbalized understanding and all questions were answered.  Patient received an After-Visit Summary.    Orders Placed This Encounter  Procedures  . Urinalysis w microscopic + reflex cultur  . POCT Urinalysis Dipstick   Meds ordered this encounter  Medications  . nitrofurantoin, macrocrystal-monohydrate, (MACROBID) 100 MG capsule    Sig: Take 1 capsule (100 mg total) by mouth 2 (two) times daily.    Dispense:  10 capsule    Refill:  0   Referral Orders  No referral(s) requested today     Note is dictated utilizing voice recognition software. Although note has been proof read prior to signing,  occasional typographical errors still can be missed. If any questions arise, please do not hesitate to call for verification.   electronically signed by:  Howard Pouch, DO  [place]

## 2020-10-26 ENCOUNTER — Other Ambulatory Visit (HOSPITAL_COMMUNITY): Payer: Self-pay

## 2020-10-27 LAB — CULTURE INDICATED

## 2020-10-27 LAB — URINALYSIS W MICROSCOPIC + REFLEX CULTURE
Bacteria, UA: NONE SEEN /HPF
Bilirubin Urine: NEGATIVE
Glucose, UA: NEGATIVE
Hgb urine dipstick: NEGATIVE
Hyaline Cast: NONE SEEN /LPF
Nitrites, Initial: NEGATIVE
Protein, ur: NEGATIVE
RBC / HPF: NONE SEEN /HPF (ref 0–2)
Specific Gravity, Urine: 1.023 (ref 1.001–1.03)
WBC, UA: NONE SEEN /HPF (ref 0–5)
pH: <=5 (ref 5.0–8.0)

## 2020-10-27 LAB — URINE CULTURE
MICRO NUMBER:: 11774635
SPECIMEN QUALITY:: ADEQUATE

## 2020-10-29 ENCOUNTER — Ambulatory Visit: Payer: 59 | Admitting: Psychiatry

## 2020-10-30 ENCOUNTER — Other Ambulatory Visit (HOSPITAL_COMMUNITY): Payer: Self-pay

## 2020-10-31 ENCOUNTER — Telehealth: Payer: Self-pay

## 2020-10-31 ENCOUNTER — Other Ambulatory Visit (HOSPITAL_COMMUNITY): Payer: Self-pay

## 2020-10-31 NOTE — Telephone Encounter (Signed)
Spoke with pt regarding urine results, voiced understanding.  [9:14 AM] [name] returning vanessa's call  [phone].    Ma Hillock, DO  10/30/2020 9:46 AM EDT      Urine culture positive for E. coli uti. Abx prescribed during her appt will treat

## 2020-11-02 ENCOUNTER — Other Ambulatory Visit (HOSPITAL_COMMUNITY): Payer: Self-pay

## 2020-11-06 ENCOUNTER — Telehealth: Payer: Self-pay | Admitting: Adult Health

## 2020-11-06 NOTE — Telephone Encounter (Signed)
Pt left message that the trazodone she is taking is not working. Please call to discuss options.

## 2020-11-07 NOTE — Telephone Encounter (Signed)
I have been trying go reach her with no luck.Will keep trying

## 2020-11-07 NOTE — Telephone Encounter (Signed)
Noted  

## 2020-11-08 ENCOUNTER — Other Ambulatory Visit (HOSPITAL_COMMUNITY): Payer: Self-pay

## 2020-11-08 DIAGNOSIS — D3709 Neoplasm of uncertain behavior of other specified sites of the oral cavity: Secondary | ICD-10-CM | POA: Diagnosis not present

## 2020-11-08 DIAGNOSIS — K136 Irritative hyperplasia of oral mucosa: Secondary | ICD-10-CM | POA: Diagnosis not present

## 2020-11-08 MED ORDER — HYDROCODONE-ACETAMINOPHEN 5-325 MG PO TABS
ORAL_TABLET | ORAL | 0 refills | Status: DC
  Filled 2020-11-08: qty 10, 3d supply, fill #0

## 2020-11-08 MED ORDER — CLINDAMYCIN HCL 150 MG PO CAPS
150.0000 mg | ORAL_CAPSULE | Freq: Four times a day (QID) | ORAL | 0 refills | Status: DC
  Filled 2020-11-08: qty 20, 5d supply, fill #0

## 2020-11-13 ENCOUNTER — Telehealth: Payer: Self-pay

## 2020-11-13 ENCOUNTER — Other Ambulatory Visit (HOSPITAL_COMMUNITY): Payer: Self-pay

## 2020-11-13 MED ORDER — FLUCONAZOLE 150 MG PO TABS
150.0000 mg | ORAL_TABLET | Freq: Once | ORAL | 0 refills | Status: AC
  Filled 2020-11-13: qty 1, 1d supply, fill #0

## 2020-11-13 MED FILL — Syringe/Needle (Disp) 3 ML 25 x 1": 28 days supply | Qty: 2 | Fill #0 | Status: AC

## 2020-11-13 NOTE — Telephone Encounter (Signed)
Diflucan prescribed x1 She could also try an OTC monistat 3 day ovule treatment.

## 2020-11-13 NOTE — Telephone Encounter (Signed)
Spoke with pt regarding rx and instructions.   

## 2020-11-13 NOTE — Telephone Encounter (Signed)
Pt was last seen 4/14 and given Macrobid to take for 10 days. Please Advise   [facility] [place] [facility] - Client Nonclinical Telephone Record  [facility] Client [facility] - Client Client Site [place] - Day Physician Raoul Pitch, [place] Type Call Who Is Calling Patient / Member / Family / [place] Name Rosemond Lyttle Caller Phone Number [phone] Patient Name Donnis Pecha Patient DOB 1956-02-10 Call Type Message Only Information Provided Reason for Call Medication Question / Request Initial Comment Needing to get a medication for a possible a yeast infection from taking a Antibiotic Pharmacy [phone]. Declined triage. Disp. Time Disposition Final User 11/13/2020 12:43:24 PM General Information Provided Yes Silvano Rusk Call Closed By: Silvano Rusk Transaction Date/Time: 11/13/2020 12:39:29 PM (ET)

## 2020-11-15 ENCOUNTER — Other Ambulatory Visit (HOSPITAL_COMMUNITY): Payer: Self-pay

## 2020-11-15 DIAGNOSIS — N760 Acute vaginitis: Secondary | ICD-10-CM | POA: Diagnosis not present

## 2020-11-15 DIAGNOSIS — Z01419 Encounter for gynecological examination (general) (routine) without abnormal findings: Secondary | ICD-10-CM | POA: Diagnosis not present

## 2020-11-15 DIAGNOSIS — Z6821 Body mass index (BMI) 21.0-21.9, adult: Secondary | ICD-10-CM | POA: Diagnosis not present

## 2020-11-15 MED ORDER — METRONIDAZOLE 500 MG PO TABS
ORAL_TABLET | ORAL | 0 refills | Status: DC
  Filled 2020-11-15: qty 14, 7d supply, fill #0

## 2020-11-19 ENCOUNTER — Ambulatory Visit: Payer: 59 | Admitting: Psychiatry

## 2020-11-20 ENCOUNTER — Ambulatory Visit: Payer: 59 | Admitting: Adult Health

## 2020-11-26 ENCOUNTER — Other Ambulatory Visit: Payer: Self-pay

## 2020-11-26 ENCOUNTER — Ambulatory Visit (INDEPENDENT_AMBULATORY_CARE_PROVIDER_SITE_OTHER): Payer: 59 | Admitting: Adult Health

## 2020-11-26 ENCOUNTER — Encounter: Payer: Self-pay | Admitting: Adult Health

## 2020-11-26 ENCOUNTER — Other Ambulatory Visit (HOSPITAL_COMMUNITY): Payer: Self-pay

## 2020-11-26 DIAGNOSIS — F410 Panic disorder [episodic paroxysmal anxiety] without agoraphobia: Secondary | ICD-10-CM

## 2020-11-26 DIAGNOSIS — F331 Major depressive disorder, recurrent, moderate: Secondary | ICD-10-CM | POA: Diagnosis not present

## 2020-11-26 DIAGNOSIS — F411 Generalized anxiety disorder: Secondary | ICD-10-CM | POA: Diagnosis not present

## 2020-11-26 DIAGNOSIS — G4700 Insomnia, unspecified: Secondary | ICD-10-CM

## 2020-11-26 DIAGNOSIS — F4310 Post-traumatic stress disorder, unspecified: Secondary | ICD-10-CM | POA: Diagnosis not present

## 2020-11-26 MED ORDER — TEMAZEPAM 15 MG PO CAPS
ORAL_CAPSULE | ORAL | 2 refills | Status: DC
  Filled 2020-11-26: qty 60, 30d supply, fill #0

## 2020-11-26 NOTE — Progress Notes (Signed)
[name] [id number] 08-26-1955 65 y.o.  Subjective:   Patient ID:  [name] is a 65 y.o. (DOB 1956/02/25) female.  Chief Complaint: No chief complaint on file.   HPI [name] presents to the office today for follow-up of MDD, GAD, panic disorder, insomnia and PTSD.  Describes mood today as "better". Pleasant. Mood symptoms - reports decreased depression and irritability. Feels more anxious overall. Stating - "things are going better". Recent tongue biopsy. House being renovated. Granddaughter visiting this week. She and husband doing better - had a discussion about things. Married for 16 years. Decreased interest and motivation. Seeing therapist - Rockne Menghini. Taking medications as prescribed.  Energy levels stable. Active, does not have a regular exercise routine. Averages 60 miles a week. Enjoys some usual interests and activities. Married. Lives with husband of 16 years. Mother lives in [place]. Spending time with family. Appetite adequate. Weight loss - with not eating - 5 pounds. Sleeping difficulties - mind racing at night.  Averages 5 to 6 hours. Focus and concentration stable. Completing tasks. Managing aspects of household. Works full time Surveyor, mining. Denies SI or HI.  Denies AH or VH.  Previous medication trials: Remeron  GAD-7   Flowsheet Row Office Visit from 04/27/2019 in [facility] Visit from 12/29/2018 in [facility]  Total GAD-7 Score 11 18    Mini-Mental   Flowsheet Row Office Visit from 12/22/2016 in [facility]  Total Score (max 30 points ) 30    PHQ2-9   Flowsheet Row Office Visit from 05/23/2020 in [facility] Visit from 04/27/2019 in [facility] Visit from 12/29/2018 in [facility] Visit from 04/20/2018 in [facility] Office Visit from 04/09/2017 in [facility] Total Score 0 0 5 0 0  PHQ-9 Total Score -- -- 17 4 2        Review of Systems:  Review of Systems  Musculoskeletal: Negative for gait problem.  Neurological: Negative for tremors.  Psychiatric/Behavioral:       Please refer to HPI    Medications: I have reviewed the patient's current medications.  Current Outpatient Medications  Medication Sig Dispense Refill  . temazepam (RESTORIL) 15 MG capsule Take one to two capsules at bedtime as needed for sleep. 60 capsule 2  . ALPRAZolam (XANAX) 0.5 MG tablet TAKE 1 TABLET BY MOUTH 3 TIMES DAILY AS NEEDED FOR ANXIETY 270 tablet 1  . atenolol (TENORMIN) 25 MG tablet TAKE 1/2 TABLET (12.5 MG TOTAL) BY MOUTH DAILY. 45 tablet 1  . B-D 3CC LUER-LOK SYR 25GX1" 25G X 1" 3 ML MISC USE WITH CYANOCOBALAMIN EVERY 14 DAYS    . cholecalciferol (VITAMIN D) 1000 UNITS tablet Take 1,000 Units by mouth every evening.    . clindamycin (CLEOCIN) 150 MG capsule Take 1 capsule (150 mg total) by mouth 4 (four) times daily until gone 20 capsule 0  . clobetasol (TEMOVATE) 0.05 % external solution APPLY TOPICALLY ONCE DAILY AS NEEDED FOR DERMATITIS 50 mL 2  . cyanocobalamin (,VITAMIN B-12,) 1000 MCG/ML injection INJECT 1 ML INTO THE MUSCLE EVERY 14 DAYS 2 mL 11  . cyclobenzaprine (FLEXERIL) 10 MG tablet take 1/2-1 tablet po q 8-12 hours as needed for acute muscle spasms    . estradiol (ESTRACE) 0.1 MG/GM vaginal cream INSERT 1 GRAM VAGINALLY 2-3 TIMES PER WEEK  42.5 g 8  . estradiol (VIVELLE-DOT) 0.075 MG/24HR Place 1 patch onto the skin 2 (two) times a week. Wednesday and Sunday    . fluorometholone (FML) 0.1 % ophthalmic suspension 1 drop 3 (three) times daily.    Marland Kitchen FLUoxetine (PROZAC) 40 MG capsule TAKE 1 CAPSULE BY MOUTH ONCE DAILY AFTER BREAKFAST 90 capsule 1  . fluticasone (FLONASE) 50 MCG/ACT nasal spray Place 2 sprays into the nose daily. 16 g 5  . HYDROcodone-acetaminophen (NORCO/VICODIN) 5-325 MG tablet Take 1 tablet by by mouth every 6  hours as needed for pain 10 tablet 0  . hydrocortisone (ANUSOL-HC) 2.5 % rectal cream Place 1 application rectally 2 (two) times daily. 30 g 1  . lamoTRIgine (LAMICTAL) 200 MG tablet Take 1 tablet (200 mg total) by mouth at bedtime. 90 tablet 3  . metroNIDAZOLE (FLAGYL) 500 MG tablet Take 1 tablet by mouth twice a day for 7 days. 14 tablet 0  . METRONIDAZOLE, TOPICAL, 0.75 % LOTN USE EXTERNALLY ONCE DAILY AS DIRECTED 59 mL 3  . nitrofurantoin, macrocrystal-monohydrate, (MACROBID) 100 MG capsule Take 1 capsule (100 mg total) by mouth 2 (two) times daily. 10 capsule 0  . Olopatadine HCl 0.2 % SOLN     . omeprazole (PRILOSEC) 40 MG capsule Take 1 capsule (40 mg total) by mouth daily before breakfast. (Patient taking differently: Take 40 mg by mouth as needed.) 90 capsule 0  . PAZEO 0.7 % SOLN   98  . sucralfate (CARAFATE) 1 GM/10ML suspension TAKE 10 MLS BY MOUTH 3 TIMES DAILY AS NEEDED FOR DIGESTION 840 mL 2  . SYRINGE-NEEDLE, DISP, 3 ML 25G X 1" 3 ML MISC USE WITH CYANOCOBALAMIN EVERY 14 DAYS 2 each 11  . traZODone (DESYREL) 50 MG tablet Take 1 tablet (50 mg total) by mouth at bedtime. (Patient not taking: Reported on 10/25/2020) 90 tablet 1  . trimethoprim-polymyxin b (POLYTRIM) ophthalmic solution PLACE 1 DROP INTO THE LEFT EYE EVERY 6 HOURS. 10 mL 0   No current facility-administered medications for this visit.    Medication Side Effects: None  Allergies:  Allergies  Allergen Reactions  . Aspartame And Phenylalanine Other (See Comments)    Migraines  . Beef-Derived Products Other (See Comments)    severe cramping  . Citrus Other (See Comments)    Causes Interstitial cystitis flares  . Penicillins Hives    Denies airway involvement Has patient had a PCN reaction causing immediate rash, facial/tongue/throat swelling, SOB or lightheadedness with hypotension: yes hives.  Has patient had a PCN reaction causing severe rash involving mucus membranes or skin necrosis:no Has patient had a PCN  reaction that required hospitalization No Has patient had a PCN reaction occurring within the last 10 years: No If all of the above answers are "NO", then may proceed with Cephalosporin use.   . Promethazine Hcl Other (See Comments)    Muscle twitching and contracture   . Adhesive [Tape]     Red splotches  . Beta Vulgaris   . Promethazine   . Doxycycline Nausea And Vomiting  . Morphine Sulfate Nausea And Vomiting    Past Medical History:  Diagnosis Date  . Allergy   . Anemia    after birth of child 68yrs ago  . Anxiety and depression    with insomnia  . Blood transfusion without reported diagnosis    x3  . Chronic back pain    ? RA, ? Lupus - reports saw rheumatologist in the past but better now, sees Dr.  Poole  . Eczema   . GERD (gastroesophageal reflux disease)    takes Omeprazole daily, with nausea  . Heart murmur   . Hemorrhoids   . History of kidney stones    passed on her own  . History of migraine    last one a yr ago and takes Zomig prn  . Hot flashes 11/10/2014  . HTN (hypertension) 03/31/2012  . Hx of echocardiogram    Echo (8/15):  EF 55-60%, no RWMA  . IBS (irritable bowel syndrome)    constipation predominant  . Interstitial cystitis    hx of UTI  . Numbness    both legs and related to back  . Osteoporosis    osteopenia  . Palpitations    on metoprolol in past but made BP too low  . Pneumonia    hx of;last time 7yrs ago  . PONV (postoperative nausea and vomiting)    laryngospasm-1999  . Poor vision 10/15/2015   -? Ocular rosacea -sees opthomology   . Raynaud's disease /phenomenon 10/21/2012   no meds, worse in the winter or cold environment.   . Rheumatoid aortitis    uses Diclofenac gel;Rheumatoid  . Seasonal allergies    Flonase daily   . Urinary incontinence     Past Medical History, Surgical history, Social history, and Family history were reviewed and updated as appropriate.   Please see review of systems for further details on the  patient's review from today.   Objective:   Physical Exam:  There were no vitals taken for this visit.  Physical Exam Constitutional:      General: She is not in acute distress. Musculoskeletal:        General: No deformity.  Neurological:     Mental Status: She is alert and oriented to person, place, and time.     Coordination: Coordination normal.  Psychiatric:        Attention and Perception: Attention and perception normal. She does not perceive auditory or visual hallucinations.        Mood and Affect: Mood normal. Mood is not anxious or depressed. Affect is not labile, blunt, angry or inappropriate.        Speech: Speech normal.        Behavior: Behavior normal.        Thought Content: Thought content normal. Thought content is not paranoid or delusional. Thought content does not include homicidal or suicidal ideation. Thought content does not include homicidal or suicidal plan.        Cognition and Memory: Cognition and memory normal.        Judgment: Judgment normal.     Comments: Insight intact     Lab Review:     Component Value Date/Time   NA 139 05/23/2020 1011   NA 143 12/22/2016 0948   K 5.4 No hemolysis seen (H) 05/23/2020 1011   CL 102 05/23/2020 1011   CO2 33 (H) 05/23/2020 1011   GLUCOSE 81 05/23/2020 1011   GLUCOSE 95 04/21/2006 1635   BUN 14 05/23/2020 1011   BUN 17 12/22/2016 0948   CREATININE 1.04 05/23/2020 1011   CALCIUM 9.4 05/23/2020 1011   PROT 6.4 05/23/2020 1011   PROT 7.0 12/22/2016 0948   ALBUMIN 4.4 05/23/2020 1011   ALBUMIN 4.7 12/22/2016 0948   AST 21 05/23/2020 1011   ALT 14 05/23/2020 1011   ALKPHOS 70 05/23/2020 1011   BILITOT 0.5 05/23/2020 1011   BILITOT 0.3 12/22/2016 0948   GFRNONAA 65  12/22/2016 0948   GFRAA 75 12/22/2016 0948       Component Value Date/Time   WBC 4.0 05/23/2020 1011   RBC 4.60 05/23/2020 1011   HGB 14.0 05/23/2020 1011   HGB 14.1 12/22/2016 0948   HCT 42.8 05/23/2020 1011   HCT 44.0 12/22/2016  0948   PLT 181.0 05/23/2020 1011   PLT 206 12/22/2016 0948   MCV 93.1 05/23/2020 1011   MCV 94 12/22/2016 0948   MCH 30.3 12/22/2016 0948   MCH 30.7 11/01/2015 1154   MCHC 32.6 05/23/2020 1011   RDW 13.7 05/23/2020 1011   RDW 14.3 12/22/2016 0948   LYMPHSABS 1.0 04/27/2019 1010   LYMPHSABS 0.9 12/22/2016 0948   MONOABS 0.4 04/27/2019 1010   EOSABS 0.2 04/27/2019 1010   EOSABS 0.1 12/22/2016 0948   BASOSABS 0.0 04/27/2019 1010   BASOSABS 0.0 12/22/2016 0948    No results found for: POCLITH, LITHIUM   No results found for: PHENYTOIN, PHENOBARB, VALPROATE, CBMZ   .res Assessment: Plan:    Plan:  PDMP reviewed  1. Xanax 0.5mg  TID  2. Lamictal 200mg  daily 3. Prozac 40mg  daily  4. D/C Trazdone 25mg  at hs 5. Add Restoril 15mg  - 1 to 2 at bedtime  RTC 3 months  Patient advised to contact office with any questions, adverse effects, or acute worsening in signs and symptoms.  Discussed potential benefits, risk, and side effects of benzodiazepines to include potential risk of tolerance and dependence, as well as possible drowsiness.  Advised patient not to drive if experiencing drowsiness and to take lowest possible effective dose to minimize risk of dependence and tolerance.   Diagnoses and all orders for this visit:  Insomnia, unspecified type -     temazepam (RESTORIL) 15 MG capsule; Take one to two capsules at bedtime as needed for sleep.  Generalized anxiety disorder  PTSD (post-traumatic stress disorder)  Panic disorder  Moderate recurrent major depression (HCC)     Please see After Visit Summary for patient specific instructions.  Future Appointments  Date Time Provider Department Center  12/03/2020 11:00 AM Mathis Fare, LCSW [facility] None  12/17/2020 11:00 AM Mathis Fare, LCSW [facility] None  12/31/2020 10:00 AM Mathis Fare, LCSW [facility] None  02/26/2021 11:20 AM Hussain Maimone, Thereasa Solo, NP [facility] None    No orders of the defined types were placed in this  encounter.   -------------------------------

## 2020-12-03 ENCOUNTER — Ambulatory Visit (INDEPENDENT_AMBULATORY_CARE_PROVIDER_SITE_OTHER): Payer: 59 | Admitting: Psychiatry

## 2020-12-03 ENCOUNTER — Other Ambulatory Visit: Payer: Self-pay

## 2020-12-03 DIAGNOSIS — F411 Generalized anxiety disorder: Secondary | ICD-10-CM

## 2020-12-03 NOTE — Progress Notes (Signed)
[facility] Counselor/Therapist Progress Note  Patient ID: [name], MRN: [id number],    Date: 12/03/2020  Time Spent: 60 minutes  Treatment Type: Individual Therapy  Reported Symptoms: anxiety, some depression  Mental Status Exam:  Appearance:   Casual     Behavior:  Appropriate, Sharing and Motivated  Motor:  Normal  Speech/Language:   WNL  Affect:  anxious, some depression  Mood:  anxious and depressed  Thought process:  goal directed  Thought content:    WNL  Sensory/Perceptual disturbances:    WNL  Orientation:  oriented to person, place, time/date, situation, day of week, month of year and year  Attention:  Good  Concentration:  Good  Memory:  WNL  Fund of knowledge:   Good  Insight:    Good  Judgment:   Good  Impulse Control:  Good   Risk Assessment: Danger to Self:  No Self-injurious Behavior: No Danger to Others: No Duty to Warn:no Physical Aggression / Violence:No  Access to Firearms a concern: No  Gang Involvement:No   Subjective:  Patient in today reporting anxiety and some depression relating to her home life and husband, and also work-related, 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 Anxiety and frustration are main symptoms.  Currently husband's cancer is in remission. Struggles in her relationship with husband and in her job where things are very stressful. Feels husband is having some depression and won't get help. Difficulty communicating with husband in productive ways. Did have a couple better moments since last appt but hard for him to maintain.  He refuses to seek help. Patient does state that her depression has lessened some since last appt. Working hard on communication and understanding in marital relationship.  Things that help include: being with her friends, biking, being outside, animal companions at home.  Worked on better managing her frustrations, especially with husband and how to look more for what goes right versus wrong.  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 Tries to see things more from her husband's point of view especially based on medical history that she feels has a lot to do with his mood.  Paying attention to "the way I say what I say" as we worked on in session and also talking with less emotion attached and in a calmer manner to hopefully be understood better per her strategy and short-term goal and treatment plan above.     Interventions: Solution-Oriented/Positive Psychology, Ego-Supportive and Insight-Oriented  Diagnosis:   ICD-10-CM   1. Generalized anxiety disorder  F41.1     Treatment Goal Plan:  Patient not signing the tx plan on computer screen due to COVID Treatment Goals: Goals remain on treatment plan as patient works on strategies to meet her goals. Progress will be noted each session and documented in "Progress" section of treatment plan. Long term goal: Develop healthy cognitive patterns and beliefs about self and the world that lead to alleviation of depression and anxiety and help prevent relapse. Progressing:  Patient is motivated. Struggling to focus some due to immediate, unexpected health concern with husband. Short term goal: Learn and implement personal skills for managing stress, solving daily problems, and resolving conflicts effectively. Strategy: Teach patient calming skills, problem solving skills, and conflict resolution skills, to better manage daily stressors.   Progress / Plan: Patient in today reporting anxiety and  some depression, but also showing some optimism as there are times when husband responds more favorably and there communication seems to go better.  There are more times however that it does not go that well.  Paying more attention to how she speaks with husband she feels is  starting to help some.  Also realizing what she can control and what she cannot control in terms of relationship with him and how his point of view is often different from hers.  Does notice that when they get away to go camping or to the beach etc. they tend to get along better and those are activities that husband enjoys.  He is very fixed on the idea that his surgery has "taken him away from being a man" and refuses to see a counselor to try and work on these issues.  Wife is trying to be more affirming with him that his surgery "did not take him away and she would like for them to work on their relationship to get better as it did not change her feelings for him".  Patient wants to be able to express more of these feelings with husband and plans to do so, and not base it on whether he seems receptive or not. Encouraged patient to  Continue spending time outdoors each day, stay involved with her biking and other exercise, to have some time just to herself that she feels like would be helpful, to continue pursuing meaningful conversation with husband, stay in touch with people that are supportive, practice more positive self-care and self talk, take breaks as needed to experience more calmness, stay in the present focusing on what she can control or change, intentionally look for more positives daily, give helpful feedback when husband talks or acts in more positive ways, and to recognize the strength she is showing and working with her goal-directed behaviors in the midst of stressful circumstances as she tries to move forward in a more positive and hopeful direction.   Goal review and progress/challenges noted with patient.  Next appointment within 2 to 3 weeks.   Shanon Ace, LCSW

## 2020-12-11 ENCOUNTER — Other Ambulatory Visit (HOSPITAL_COMMUNITY): Payer: Self-pay

## 2020-12-11 MED FILL — Cyanocobalamin Inj 1000 MCG/ML: INTRAMUSCULAR | 28 days supply | Qty: 2 | Fill #1 | Status: AC

## 2020-12-17 ENCOUNTER — Ambulatory Visit (INDEPENDENT_AMBULATORY_CARE_PROVIDER_SITE_OTHER): Payer: 59 | Admitting: Psychiatry

## 2020-12-17 ENCOUNTER — Other Ambulatory Visit: Payer: Self-pay

## 2020-12-17 DIAGNOSIS — F411 Generalized anxiety disorder: Secondary | ICD-10-CM | POA: Diagnosis not present

## 2020-12-17 NOTE — Progress Notes (Signed)
[facility] Counselor/Therapist Progress Note  Patient ID: [name], MRN: [id number],    Date: 12/17/2020  Time Spent: 60 minutes   Treatment Type: Individual Therapy  Reported Symptoms: anxiety  Mental Status Exam:  Appearance:   Casual and Neat     Behavior:  Appropriate, Sharing and Motivated  Motor:  Normal  Speech/Language:   Clear and Coherent  Affect:  anxious  Mood:  anxious  Thought process:  normal  Thought content:    WNL  Sensory/Perceptual disturbances:    WNL  Orientation:  oriented to person, place, time/date, situation, day of week, month of year and year  Attention:  Good  Concentration:  Good  Memory:  WNL  Fund of knowledge:   Good  Insight:    Good  Judgment:   Good  Impulse Control:  Good   Risk Assessment: Danger to Self:  No Self-injurious Behavior: No Danger to Others: No Duty to Warn:no Physical Aggression / Violence:No  Access to Firearms a concern: No  Gang Involvement:No   Subjective:  Patient in today reporting some improvement in communication and relationship with husband. Is following through on looking more for positives vs negatives. And is finding some more positives, which is a good beginning for patient in focusing on her mindset.  They are experiencing some better times a little more often and patient is showing more motivation and desire to contribute to the better times and work with her husband.  Less friction between the 2 of them and husband is actually showing more moments of "being happier and in a good mood".  Patient focusing on some of her actions/reactions with him and looking at "how her behavior is around the times when he seems to be doing better" to better determine how her behavior may be influencing him in positive ways, and other behaviors "may lead to more negative results and may have in the past."  Patient definitely seems to be holding out more hope for their relationship and wants to work on her not  giving so much attention to any negatives and being able to accentuate the positives.  Has had a few more conversations with husband that were productive versus counterproductive and that is feeling hopeful to patient.  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  She has found that things that help her mood include biking, being outside, being with her friends, and enjoying her pets at home.  Worked some more today on better managing frustrations as this seems to be paying off her patient in a positive way has acquired more additional "frustration management skills", along with improving communication skills, focusing more on the positives versus negatives, and increasing tolerance for when things do not go as planned.  Continues to put more effort into trying to see things from her husband's point of view especially when it is different from her own point of view.  Also continue to work on her communication skills and "the way I say what I say" which has helped her feel more heard by husband.  Encouraged by the changes she is making and also what she sees in her husband even though she realizes some changes are "small changes", but realizing that change has to start somewhere and is feeling some more positive about her relationship due to the changes noted here.  See progress/plan below.  Interventions: Cognitive Behavioral Therapy, Solution-Oriented/Positive Psychology and Ego-Supportive  Diagnosis:   ICD-10-CM   1.  Generalized anxiety disorder  F41.1     Treatment goal plan:  Patient not signing the tx plan on computer screen due to COVID Treatment Goals: Goals remain on treatment plan as patient works on strategies to meet her goals. Progress will be noted each session and documented in "Progress" section of treatment plan. Long term goal: Develop healthy cognitive patterns and beliefs about self and the world that lead to alleviation of  depression and anxiety and help prevent relapse. Progressing:  Patient is motivated. Struggling to focus some due to immediate, unexpected health concern with husband. Short term goal: Learn and implement personal skills for managing stress, solving daily problems, and resolving conflicts effectively. Strategy: Teach patient calming skills, problem solving skills, and conflict resolution skills, to better manage daily stressors.    Progress / Plan: Patient today showing good motivation and is feeling less angry, but some anxiety.  Noticing more positives versus negatives in her relationship with husband.  Is seeing that her husband is trying to work on some of his issues within the relationship and patient acknowledges she has some of her own.  Definitely some optimism on patient's part and also her willingness to work on tolerating imperfection more.  Is seeing more positives and husband and feels this is at least a better start in trying to make their relationship healthier. Encouraged patient to focus also on her self-care and more positive self talk, to continue spending time outdoors each day including her biking, to stay in touch with people that are supportive, pursuing meaningful conversation with husband practice more consistent positive self talk, take breaks as needed to experience more calmness for herself, intentionally look for more positives every day, provide helpful feedback to husband regarding positives in their relationship, and to feel good about the strength that she is showing as she works with goal-directed behaviors while living in the midst of challenging and often changing circumstances as she tries to move forward in a positive direction towards better overall emotional health.   Goal review and progress/challenges noted with patient.  Next appointment within 2-3 weeks.   Shanon Ace, LCSW

## 2020-12-17 NOTE — Patient Instructions (Signed)
Patient today

## 2020-12-19 DIAGNOSIS — Z13820 Encounter for screening for osteoporosis: Secondary | ICD-10-CM | POA: Diagnosis not present

## 2020-12-31 ENCOUNTER — Ambulatory Visit: Payer: 59 | Admitting: Psychiatry

## 2021-01-10 ENCOUNTER — Other Ambulatory Visit (HOSPITAL_COMMUNITY): Payer: Self-pay

## 2021-01-10 MED FILL — Cyanocobalamin Inj 1000 MCG/ML: INTRAMUSCULAR | 28 days supply | Qty: 2 | Fill #2 | Status: AC

## 2021-01-10 MED FILL — Syringe/Needle (Disp) 3 ML 25 x 1": 28 days supply | Qty: 2 | Fill #1 | Status: AC

## 2021-01-23 ENCOUNTER — Other Ambulatory Visit (HOSPITAL_COMMUNITY): Payer: Self-pay

## 2021-01-23 MED ORDER — METRONIDAZOLE 500 MG PO TABS
ORAL_TABLET | ORAL | 0 refills | Status: DC
  Filled 2021-01-23: qty 14, 7d supply, fill #0

## 2021-01-24 ENCOUNTER — Other Ambulatory Visit (HOSPITAL_COMMUNITY): Payer: Self-pay

## 2021-01-24 DIAGNOSIS — S81831A Puncture wound without foreign body, right lower leg, initial encounter: Secondary | ICD-10-CM | POA: Diagnosis not present

## 2021-01-24 DIAGNOSIS — W540XXA Bitten by dog, initial encounter: Secondary | ICD-10-CM | POA: Diagnosis not present

## 2021-01-24 MED ORDER — CEFUROXIME AXETIL 500 MG PO TABS
500.0000 mg | ORAL_TABLET | Freq: Two times a day (BID) | ORAL | 0 refills | Status: DC
  Filled 2021-01-24: qty 14, 7d supply, fill #0

## 2021-01-24 MED ORDER — METRONIDAZOLE 500 MG PO TABS
500.0000 mg | ORAL_TABLET | Freq: Two times a day (BID) | ORAL | 0 refills | Status: DC
  Filled 2021-01-24: qty 14, 7d supply, fill #0

## 2021-01-28 ENCOUNTER — Other Ambulatory Visit (HOSPITAL_COMMUNITY): Payer: Self-pay

## 2021-01-31 ENCOUNTER — Ambulatory Visit: Payer: 59 | Admitting: Psychiatry

## 2021-02-06 ENCOUNTER — Other Ambulatory Visit (HOSPITAL_COMMUNITY): Payer: Self-pay

## 2021-02-06 DIAGNOSIS — M79641 Pain in right hand: Secondary | ICD-10-CM | POA: Diagnosis not present

## 2021-02-06 MED ORDER — PREDNISONE 10 MG PO TABS
ORAL_TABLET | ORAL | 0 refills | Status: DC
  Filled 2021-02-06: qty 21, 12d supply, fill #0

## 2021-02-06 NOTE — Progress Notes (Signed)
[place] Phone: [phone] Subjective:    I'm seeing this patient by the request  of:  Raoul Pitch, Reinaldo Raddle, DO  I, Peterson Lombard, PhD, LAT, ATC acting as a scribe for Charlann Boxer, DO.  CC: Left hip pain  [id number]  [name] is a 65 y.o. female coming in with complaint of L hip pain ongoing for 2-3 week, worsening pain. Last seen for [facility] pain in January. 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 Pain is intermittent. Pt locates pain to deep within the hip joint, anteriorly, and deep within the L buttock. Pain worsens in transitioning from sitting to standing. Pt describes pain as severe and sometimes experiencing numbness/tingling.  Therapies tried- Asprin, creams, hot, cold, muscle relaxers  Pt is out of the flexeril and would like a refill because she found it helpful.  Past medical history does have history of arthritic changes of the back as well as some surgical intervention.    Past Medical History:  Diagnosis Date   Allergy    Anemia    after birth of child 39yr ago   Anxiety and depression    with insomnia   Blood transfusion without reported diagnosis    x3   Chronic back pain    ? RA, ? Lupus - reports saw rheumatologist in the past but better now, sees Dr. PTrenton Gammon  Eczema    GERD (gastroesophageal reflux disease)    takes Omeprazole daily, with nausea   Heart murmur    Hemorrhoids    History of kidney stones    passed on her own   History of migraine    last one a yr ago and takes Zomig prn   Hot flashes 11/10/2014   HTN (hypertension) 03/31/2012   Hx of echocardiogram    Echo (8/15):  EF 55-60%, no RWMA   IBS (irritable bowel syndrome)    constipation predominant   Interstitial cystitis    hx of UTI   Numbness    both legs and related to back   Osteoporosis    osteopenia   Palpitations    on metoprolol in past but made BP too low   Pneumonia    hx  of;last time 166yrago   PONV (postoperative nausea and vomiting)    laryngospasm-1999   Poor vision 10/15/2015   -? Ocular rosacea -sees opthomology    Raynaud's disease /phenomenon 10/21/2012   no meds, worse in the winter or cold environment.    Rheumatoid aortitis    uses Diclofenac gel;Rheumatoid   Seasonal allergies    Flonase daily    Urinary incontinence    Past Surgical History:  Procedure Laterality Date   ABDOMINAL HYSTERECTOMY  1997   BREAST BIOPSY  1999   benign   COLONOSCOPY  02/11/2013 and 12/24/04   internal and external hemorrhoids   ENDOMETRIAL ABLATION     EXCISION MORTON'S NEUROMA Right    FLEXIBLE SIGMOIDOSCOPY  03/07/2008   internal and external hemorrhoids   POSTERIOR LUMBAR FUSION  05/02/2013   L4-L5 fusion (cage/screws/bone graft) Dr. PoAnnette Stable TONSILLECTOMY     UPPER GASTROINTESTINAL ENDOSCOPY  10/23/2010   gastritis, irregular Z-line   wisdom teeth extracted     Social History   Socioeconomic History   Marital status: Married    Spouse name: Not on file   Number of children: Not on file   Years of education: Not  on file   Highest education level: Not on file  Occupational History   Occupation: Programmer, multimedia: [employer]  Tobacco Use   Smoking status: Never   Smokeless tobacco: Never  Vaping Use   Vaping Use: Never used  Substance and Sexual Activity   Alcohol use: Yes    Alcohol/week: 2.0 standard drinks    Types: 1 Shots of liquor, 1 Standard drinks or equivalent per week    Comment: one margarita a week   Drug use: No   Sexual activity: Not Currently    Partners: Male  Other Topics Concern   Not on file  Social History Narrative   Spiritual Beliefs: methodist   Marital status/children/pets: In second marriage.  First husband committed suicide.  2 children.   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   Safety:      -Wears a bicycle helmet riding a bike: Yes     -smoke alarm in the home:No     - wears seatbelt: Yes     -  Feels safe in their relationships: Yes            Social Determinants of Health   Financial Resource Strain: Not on file  Food Insecurity: Not on file  Transportation Needs: Not on file  Physical Activity: Not on file  Stress: Not on file  Social Connections: Not on file   Allergies  Allergen Reactions   Aspartame And Phenylalanine Other (See Comments)    Migraines   Beef-Derived Products Other (See Comments)    severe cramping   Citrus Other (See Comments)    Causes Interstitial cystitis flares   Penicillins Hives    Denies airway involvement Has patient had a PCN reaction causing immediate rash, facial/tongue/throat swelling, SOB or lightheadedness with hypotension: yes hives.  Has patient had a PCN reaction causing severe rash involving mucus membranes or skin necrosis:no Has patient had a PCN reaction that required hospitalization No Has patient had a PCN reaction occurring within the last 10 years: No If all of the above answers are "NO", then may proceed with Cephalosporin use.    Promethazine Hcl Other (See Comments)    Muscle twitching and contracture    Adhesive [Tape]     Red splotches   Beta Vulgaris    Promethazine    Doxycycline Nausea And Vomiting   Morphine Sulfate Nausea And Vomiting   Family History  Problem Relation Age of Onset   Coronary artery disease Father    Heart attack Father    Hypertension Father    Depression Father    Hyperlipidemia Father    Alcohol abuse Father    Early death Father    Kidney disease Father    Mental illness Father    Diverticulosis Mother    Hypertension Mother    Osteoarthritis Mother    Depression Mother    Hyperlipidemia Mother    Kidney disease Mother    Alcohol abuse Sister    Depression Sister    Diabetes Paternal Uncle    Hypertension Maternal Grandmother    Stroke Maternal Grandmother    Hypertension Maternal Grandfather    Pancreatic cancer Maternal Grandfather    Early death Maternal Grandfather     Stroke Paternal Grandfather    Hyperlipidemia Paternal Grandfather    Alcohol abuse Paternal Grandfather    Early death Paternal Grandfather    Hypertension Paternal Grandfather    Depression Paternal Grandmother    Dementia Paternal Grandmother  Arthritis Paternal Grandmother    Mental illness Paternal Grandmother    Alcohol abuse Daughter    Depression Daughter    Drug abuse Daughter    Alcohol abuse Son    Depression Son    Colon cancer Neg Hx     Current Outpatient Medications (Endocrine & Metabolic):    predniSONE (DELTASONE) 10 MG tablet, Take 1 tablet three times a day for 2 days, then 1 tablet twice a day for 5 days, then 1 tablet daily till finished   estradiol (VIVELLE-DOT) 0.075 MG/24HR, Place 1 patch onto the skin 2 (two) times a week. Wednesday and Sunday (Patient not taking: Reported on 02/07/2021)  Current Outpatient Medications (Cardiovascular):    atenolol (TENORMIN) 25 MG tablet, TAKE 1/2 TABLET (12.5 MG TOTAL) BY MOUTH DAILY. (Patient not taking: Reported on 02/07/2021)  Current Outpatient Medications (Respiratory):    fluticasone (FLONASE) 50 MCG/ACT nasal spray, Place 2 sprays into the nose daily.  Current Outpatient Medications (Analgesics):    HYDROcodone-acetaminophen (NORCO/VICODIN) 5-325 MG tablet, Take 1 tablet by by mouth every 6 hours as needed for pain (Patient not taking: Reported on 02/07/2021)  Current Outpatient Medications (Hematological):    cyanocobalamin (,VITAMIN B-12,) 1000 MCG/ML injection, INJECT 1 ML INTO THE MUSCLE EVERY 14 DAYS  Current Outpatient Medications (Other):    ALPRAZolam (XANAX) 0.5 MG tablet, TAKE 1 TABLET BY MOUTH 3 TIMES DAILY AS NEEDED FOR ANXIETY   B-D 3CC LUER-LOK SYR 25GX1" 25G X 1" 3 ML MISC, USE WITH CYANOCOBALAMIN EVERY 14 DAYS   cholecalciferol (VITAMIN D) 1000 UNITS tablet, Take 1,000 Units by mouth every evening.   FLUoxetine (PROZAC) 40 MG capsule, TAKE 1 CAPSULE BY MOUTH ONCE DAILY AFTER BREAKFAST    lamoTRIgine (LAMICTAL) 200 MG tablet, Take 1 tablet (200 mg total) by mouth at bedtime.   METRONIDAZOLE, TOPICAL, 0.75 % LOTN, USE EXTERNALLY ONCE DAILY AS DIRECTED   Olopatadine HCl 0.2 % SOLN,    omeprazole (PRILOSEC) 40 MG capsule, Take 1 capsule (40 mg total) by mouth daily before breakfast. (Patient taking differently: Take 40 mg by mouth as needed.)   sucralfate (CARAFATE) 1 GM/10ML suspension, TAKE 10 MLS BY MOUTH 3 TIMES DAILY AS NEEDED FOR DIGESTION   SYRINGE-NEEDLE, DISP, 3 ML 25G X 1" 3 ML MISC, USE WITH CYANOCOBALAMIN EVERY 14 DAYS   trimethoprim-polymyxin b (POLYTRIM) ophthalmic solution, PLACE 1 DROP INTO THE LEFT EYE EVERY 6 HOURS.   cefUROXime (CEFTIN) 500 MG tablet, Take 1 tablet by mouth twice a day (Patient not taking: Reported on 02/07/2021)   clindamycin (CLEOCIN) 150 MG capsule, Take 1 capsule (150 mg total) by mouth 4 (four) times daily until gone (Patient not taking: Reported on 02/07/2021)   clobetasol (TEMOVATE) 0.05 % external solution, APPLY TOPICALLY ONCE DAILY AS NEEDED FOR DERMATITIS (Patient not taking: Reported on 02/07/2021)   cyclobenzaprine (FLEXERIL) 10 MG tablet, Take 1/2 to 1 tablet by mouth every 8 to 12 hours as needed for acute muscle spasms   fluorometholone (FML) 0.1 % ophthalmic suspension, 1 drop 3 (three) times daily. (Patient not taking: Reported on 02/07/2021)   hydrocortisone (ANUSOL-HC) 2.5 % rectal cream, Place 1 application rectally 2 (two) times daily. (Patient not taking: Reported on 02/07/2021)   metroNIDAZOLE (FLAGYL) 500 MG tablet, Take 1 tablet by mouth twice a day (Patient not taking: Reported on 02/07/2021)   nitrofurantoin, macrocrystal-monohydrate, (MACROBID) 100 MG capsule, Take 1 capsule (100 mg total) by mouth 2 (two) times daily. (Patient not taking: Reported on 02/07/2021)  PAZEO 0.7 % SOLN,    temazepam (RESTORIL) 15 MG capsule, Take one to two capsules at bedtime as needed for sleep. (Patient not taking: Reported on 02/07/2021)    traZODone (DESYREL) 50 MG tablet, Take 1 tablet (50 mg total) by mouth at bedtime. (Patient not taking: Reported on 10/25/2020)   Reviewed prior external information including notes and imaging from  primary care provider As well as notes that were available from care everywhere and other healthcare systems.  Past medical history, social, surgical and family history all reviewed in electronic medical record.  No pertanent information unless stated regarding to the chief complaint.   Review of Systems:  No headache, visual changes, nausea, vomiting, diarrhea, constipation, dizziness, abdominal pain, skin rash, fevers, chills, night sweats, weight loss, swollen lymph nodes, body aches, joint swelling, chest pain, shortness of breath, mood changes. POSITIVE muscle aches  Objective  Blood pressure 124/80, pulse 62, height '5\' 2"'$  (1.575 m), weight 114 lb 9.6 oz (52 kg), SpO2 99 %.   General: No apparent distress alert and oriented x3 mood and affect normal, dressed appropriately.  HEENT: Pupils equal, extraocular movements intact  Respiratory: Patient's speak in full sentences and does not appear short of breath  Cardiovascular: No lower extremity edema, non tender, no erythema  Gait normal with good balance and coordination.  MSK:  Non tender with full range of motion and good stability and symmetric strength and tone of shoulders, elbows, wrist, hip, knee and ankles bilaterally.  Low back exam does have mild loss of lordosis.  Very minimal tenderness noted over the piriformis, of the left greater trochanteric area and severe tenderness over the ASIS and the AIIS patient does have mild pain with hip flexion against resistance.  No pain with internal and external range of motion of the hip itself with good range of motion.  Negative fulcrum test noted.  Negative straight leg test.  5 out of 5 strength of the lower extremity other than hip flexion  Limited muscular skeletal ultrasound was performed  and interpreted by Hulan Saas, M  Limited ultrasound shows that patient may have a possible avulsion at the proximal aspect of the quadricep tendon noted.  Very difficult to say.  Patient also has some mild narrowing of the anterior lateral joint space.  Unable to save pictures today.   Impression and Recommendations:     The above documentation has been reviewed and is accurate and complete Lyndal Pulley, DO

## 2021-02-07 ENCOUNTER — Ambulatory Visit (INDEPENDENT_AMBULATORY_CARE_PROVIDER_SITE_OTHER): Payer: 59

## 2021-02-07 ENCOUNTER — Ambulatory Visit: Payer: 59 | Admitting: Family Medicine

## 2021-02-07 ENCOUNTER — Other Ambulatory Visit (HOSPITAL_COMMUNITY): Payer: Self-pay

## 2021-02-07 ENCOUNTER — Ambulatory Visit: Payer: 59 | Admitting: Podiatry

## 2021-02-07 ENCOUNTER — Other Ambulatory Visit: Payer: Self-pay

## 2021-02-07 ENCOUNTER — Encounter: Payer: Self-pay | Admitting: Family Medicine

## 2021-02-07 VITALS — BP 124/80 | HR 62 | Ht 62.0 in | Wt 114.6 lb

## 2021-02-07 DIAGNOSIS — M4316 Spondylolisthesis, lumbar region: Secondary | ICD-10-CM | POA: Diagnosis not present

## 2021-02-07 DIAGNOSIS — M47816 Spondylosis without myelopathy or radiculopathy, lumbar region: Secondary | ICD-10-CM | POA: Diagnosis not present

## 2021-02-07 DIAGNOSIS — M25552 Pain in left hip: Secondary | ICD-10-CM | POA: Insufficient documentation

## 2021-02-07 DIAGNOSIS — Z9889 Other specified postprocedural states: Secondary | ICD-10-CM | POA: Diagnosis not present

## 2021-02-07 MED ORDER — CYCLOBENZAPRINE HCL 10 MG PO TABS
ORAL_TABLET | ORAL | 2 refills | Status: DC
  Filled 2021-02-07: qty 30, 15d supply, fill #0
  Filled 2021-05-20: qty 30, 15d supply, fill #1

## 2021-02-07 NOTE — Assessment & Plan Note (Signed)
Patient does have tenderness to palpation that seems to be more over the ASIS that is concerning.  Questionable no bulging.  Patient will get x-rays and will concern for the possibility of intra-articular pathology.  Patient does have good range of motion but I do think that this could be unlikely.  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  Discussed icing regimen, discussed compression sleeve.  Increase activity slowly follow-up again in 4 to 8 weeks if worsening pain and possible advanced imaging would be warranted

## 2021-02-07 NOTE — Patient Instructions (Addendum)
Good to see you  Please get an Xray today before you leave today.  Try wearing a thigh compression sleeve  Stay off the bike for now. Try swimming or elliptical   I've prescribed Flexeril  Please complete the exercises that the athletic trainer went over with you:  View at [email or web address] using code: [id number]  Ice 20 minutes 2 times daily. Usually after activity and before bed.   See me again in 4-6 weeks

## 2021-02-18 ENCOUNTER — Ambulatory Visit (INDEPENDENT_AMBULATORY_CARE_PROVIDER_SITE_OTHER): Payer: 59 | Admitting: Psychiatry

## 2021-02-18 ENCOUNTER — Other Ambulatory Visit: Payer: Self-pay

## 2021-02-18 DIAGNOSIS — F411 Generalized anxiety disorder: Secondary | ICD-10-CM | POA: Diagnosis not present

## 2021-02-18 NOTE — Progress Notes (Signed)
[facility] Counselor/Therapist Progress Note  Patient ID: KALLIYAN GUNNETT, MRN: [id number],    Date: 02/18/2021  Time Spent: 57 minutes  Treatment Type: Individual Therapy  Reported Symptoms: anxiety  Mental Status Exam:  Appearance:   Casual     Behavior:  Appropriate, Sharing, and Motivated  Motor:  Normal  Speech/Language:   Clear and Coherent  Affect:  anxious  Mood:  anxious  Thought process:  goal directed  Thought content:    Obsessiveness  Sensory/Perceptual disturbances:    WNL  Orientation:  oriented to person, place, time/date, situation, day of week, month of year, year, and stated date of Aug. 8, 2022  Attention:  Good  Concentration:  Good  Memory:  WNL  Fund of knowledge:   Good  Insight:    Good  Judgment:   Good  Impulse Control:  Good   Risk Assessment: Danger to Self:  No Self-injurious Behavior: No Danger to Others: No Duty to Warn:no Physical Aggression / Violence:No  Access to Firearms a concern: No  Gang Involvement:No   Subjective:  Patient in today reporting anxiety and is concerned  with some recent onset of hand tremors. Discussed her anxiety and agreed to follow up with her PCP on this. Today sharing multiple family circumstances that have increased her anxiety, including issues with adult daughter, mother, and herself/husband. Anxious, frustrated.  Feels her anxiety has escalated some recently due to work and family relationships. Worked in session today on how to try and relate better within the family, even though others in family can make it a challenge. Concerned about about husband's weight gain and his overall health. Focused on communication skills (active listening and paying attention to "how I say what I say") and other relationship skills including time together and both of them focusing more on the "positives" versus "negatives" or worst case scenarios, and physical activity helps patient especially riding her bike. Working hard  to today on setting limits within family and realize she can't  control other people and" let go of the need to fix them."  Encouraged to stay involved in activities that she reports are helpful to her emotionally and in some cases, physically including : spending time with her friends, camping with husband, enjoying their 6 dogs, 2cats, and African parrot.  and increasing tolerance for when things do not go as planned.  Continues to put more effort into trying to see things from her husband's point of view especially when it is different from her own point of view.  Also working to increase her tolerance level when things do not go as planned.  Interventions: Cognitive Behavioral Therapy and Insight-Oriented  Diagnosis:   ICD-10-CM   1. Generalized anxiety disorder  F41.1       Treatment goal plan:  Patient not signing the tx plan on computer screen due to COVID Treatment Goals: Goals remain on treatment plan as patient works on strategies to meet her goals.  Progress will be noted each session and documented in "Progress" section of treatment plan. Long term goal: Develop healthy cognitive patterns and beliefs about self and the world that lead to alleviation of depression and anxiety and help prevent relapse.  Progressing: Patient is motivated. Struggling to focus some due to immediate, unexpected health concern with husband.  Short term goal: Learn and implement personal skills for managing stress, solving daily problems, and resolving conflicts effectively. Strategy: Teach patient calming skills, problem solving skills, and conflict resolution skills, to  better manage daily stressors.     Plan:  Patient today showing good motivation and engagement in session today. Admits less anger today which is a positive thing for patient. Able to actually see/experience some positives with husband occasionally.Encouraged patient to practice behaviors that have helpful in the past including: To  stay in touch with people that are supportive, focus more on her own self-care, practice positive self talk consistently, continue spending time outdoors each day including her biking, pursuing meaningful conversation with husband, take breaks as needed to experience more calmness for herself, intentionally look for more positives each day, look for positives within herself, provide helpful feedback to husband regarding the positives in their relationship, and to recognize the strength she is showing as she works with goal-directed behaviors while living in a very challenging environment at home as she tries to move forward in a more positive direction towards improved emotional health.  Goal review and progress/challenges noted with patient.  Next appointment within 3 weeks.   Shanon Ace, LCSW

## 2021-02-19 ENCOUNTER — Ambulatory Visit: Payer: 59 | Admitting: Family Medicine

## 2021-02-26 ENCOUNTER — Ambulatory Visit: Payer: 59 | Admitting: Adult Health

## 2021-02-27 ENCOUNTER — Other Ambulatory Visit (HOSPITAL_COMMUNITY): Payer: Self-pay

## 2021-02-27 ENCOUNTER — Other Ambulatory Visit: Payer: Self-pay | Admitting: Family Medicine

## 2021-02-27 MED ORDER — CYANOCOBALAMIN 1000 MCG/ML IJ SOLN
INTRAMUSCULAR | 0 refills | Status: DC
  Filled 2021-02-27: qty 6, 84d supply, fill #0

## 2021-02-27 MED ORDER — "BD LUER-LOK SYRINGE 25G X 1"" 3 ML MISC"
0 refills | Status: DC
  Filled 2021-02-27: qty 6, 84d supply, fill #0

## 2021-02-27 MED FILL — Atenolol Tab 25 MG: ORAL | 90 days supply | Qty: 45 | Fill #0 | Status: AC

## 2021-02-28 ENCOUNTER — Ambulatory Visit: Payer: 59 | Admitting: Adult Health

## 2021-03-13 ENCOUNTER — Other Ambulatory Visit: Payer: Self-pay

## 2021-03-13 ENCOUNTER — Ambulatory Visit: Payer: 59 | Admitting: Family Medicine

## 2021-03-13 ENCOUNTER — Ambulatory Visit: Payer: Self-pay

## 2021-03-13 VITALS — BP 126/70 | HR 71 | Ht 62.0 in | Wt 114.0 lb

## 2021-03-13 DIAGNOSIS — M25552 Pain in left hip: Secondary | ICD-10-CM

## 2021-03-13 DIAGNOSIS — M7062 Trochanteric bursitis, left hip: Secondary | ICD-10-CM | POA: Diagnosis not present

## 2021-03-13 NOTE — Patient Instructions (Signed)
[place] [phone] Call today  When we receive results we will contact you

## 2021-03-13 NOTE — Assessment & Plan Note (Signed)
Patient continues to have left hip pain.  Patient is having pain that is severe and seems to be out of proportion to the amount of palpation.  Fullness of the hip noted on ultrasound.  Nothing specific.  Pain that seems to be worsening.  Patient also has an association at this point of some bowel and bladder difficulties.  Will consider MRI at this as well as an MRI with and without contrast to further evaluate for stress reaction.  RTC after imaging

## 2021-03-13 NOTE — Progress Notes (Signed)
[name] [name] [address] [place] Phone: [phone] Subjective:   IVilma [name], am serving as [name] scribe for Dr. Hulan [name]. This visit occurred during the SARS-CoV-2 public health emergency.  Safety protocols were in place, including screening questions prior to the visit, additional usage of staff PPE, and extensive cleaning of exam room while observing appropriate contact time as indicated for disinfecting solutions.   I'm seeing this patient by the request  of:  [name], Carol A, DO  CC: left hip pain   [name]  [name] is [name] 65 y.o. female coming in with complaint of left hip pain.  Patient was seen previously and was diagnosed with [name] possible avulsion of the proximal quadricep tendon.  There are some mild narrowing of the joint space on the lateral aspect on ultrasound. Patient did have x-rays of the left hip done previously.  X-rays were independently visualized by me showing no significant bony abnormality.  Patient was to do home exercises.  Patient states radiating and burning pain in groin when standing. Also left leg tingling and numbness and spasms in the left thigh.  When asked patient does state that she has been having significant worsening as well.  Has chronic constipation at the moment that seems to be worsening.    Past Medical History:  Diagnosis Date   Allergy    Anemia    after birth of child 10yr ago   Anxiety and depression    with insomnia   Blood transfusion without reported diagnosis    x3   Chronic back pain    ? RA, ? Lupus - reports saw rheumatologist in the past but better now, sees Dr. PTrenton [name]  Eczema    GERD (gastroesophageal reflux disease)    takes Omeprazole daily, with nausea   Heart murmur    Hemorrhoids    History of kidney stones    passed on her own   History of migraine    last one [name] yr ago and takes Zomig prn   Hot flashes 11/10/2014   HTN (hypertension) 03/31/2012   Hx  of echocardiogram    Echo (8/15):  EF 55-60%, no RWMA   IBS (irritable bowel syndrome)    constipation predominant   Interstitial cystitis    hx of UTI   Numbness    both legs and related to back   Osteoporosis    osteopenia   Palpitations    on metoprolol in past but made BP too low   Pneumonia    hx of;last time 115yrago   PONV (postoperative nausea and vomiting)    laryngospasm-1999   Poor vision 10/15/2015   -? Ocular rosacea -sees opthomology    Raynaud's disease /phenomenon 10/21/2012   no meds, worse in the winter or cold environment.    Rheumatoid aortitis    uses Diclofenac gel;Rheumatoid   Seasonal allergies    Flonase daily    Urinary incontinence    Past Surgical History:  Procedure Laterality Date   ABDOMINAL HYSTERECTOMY  1997   BREAST BIOPSY  1999   benign   COLONOSCOPY  02/11/2013 and 12/24/04   internal and external hemorrhoids   ENDOMETRIAL ABLATION     EXCISION MORTON'S NEUROMA Right    FLEXIBLE SIGMOIDOSCOPY  03/07/2008   internal and external hemorrhoids   POSTERIOR LUMBAR FUSION  05/02/2013   L4-L5 fusion (cage/screws/bone graft) Dr. PoAnnette Stable TONSILLECTOMY     UPPER GASTROINTESTINAL ENDOSCOPY  10/23/2010   gastritis, irregular Z-line   wisdom teeth extracted     Social History   Socioeconomic History   Marital status: Married    Spouse name: Not on file   Number of children: Not on file   Years of education: Not on file   Highest education level: Not on file  Occupational History   Occupation: Therapist, sports    Employer: [employer]  Tobacco Use   Smoking status: Never   Smokeless tobacco: Never  Vaping Use   Vaping Use: Never used  Substance and Sexual Activity   Alcohol use: Yes    Alcohol/week: 2.0 standard drinks    Types: 1 Shots of liquor, 1 Standard drinks or equivalent per week    Comment: one margarita [name] week   Drug use: No   Sexual activity: Not Currently    Partners: Male  Other Topics Concern   Not on file  Social History Narrative    Spiritual Beliefs: methodist   Marital status/children/pets: In second marriage.  First husband committed suicide.  2 children.   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   Safety:      -Wears [name] bicycle helmet riding [name] bike: Yes     -smoke alarm in the home:No     - wears seatbelt: Yes     - Feels safe in their relationships: Yes            Social Determinants of Health   Financial Resource Strain: Not on file  Food Insecurity: Not on file  Transportation Needs: Not on file  Physical Activity: Not on file  Stress: Not on file  Social Connections: Not on file   Allergies  Allergen Reactions   Aspartame And Phenylalanine Other (See Comments)    Migraines   Beef-Derived Products Other (See Comments)    severe cramping   Citrus Other (See Comments)    Causes Interstitial cystitis flares   Penicillins Hives    Denies airway involvement Has patient had [name] PCN reaction causing immediate rash, facial/tongue/throat swelling, SOB or lightheadedness with hypotension: yes hives.  Has patient had [name] PCN reaction causing severe rash involving mucus membranes or skin necrosis:no Has patient had [name] PCN reaction that required hospitalization No Has patient had [name] PCN reaction occurring within the last 10 years: No If all of the above answers are "NO", then may proceed with Cephalosporin use.    Promethazine Hcl Other (See Comments)    Muscle twitching and contracture    Adhesive [Tape]     Red splotches   Beta Vulgaris    Promethazine    Doxycycline Nausea And Vomiting   Morphine Sulfate Nausea And Vomiting   Family History  Problem Relation Age of Onset   Coronary artery disease Father    Heart attack Father    Hypertension Father    Depression Father    Hyperlipidemia Father    Alcohol abuse Father    Early death Father    Kidney disease Father    Mental illness Father    Diverticulosis Mother    Hypertension Mother    Osteoarthritis Mother    Depression Mother     Hyperlipidemia Mother    Kidney disease Mother    Alcohol abuse Sister    Depression Sister    Diabetes Paternal Uncle    Hypertension Maternal Grandmother    Stroke Maternal Grandmother    Hypertension Maternal Grandfather    Pancreatic cancer Maternal Grandfather    Early death Maternal  Grandfather    Stroke Paternal Grandfather    Hyperlipidemia Paternal Grandfather    Alcohol abuse Paternal Grandfather    Early death Paternal Grandfather    Hypertension Paternal Grandfather    Depression Paternal Grandmother    Dementia Paternal Grandmother    Arthritis Paternal Grandmother    Mental illness Paternal Grandmother    Alcohol abuse Daughter    Depression Daughter    Drug abuse Daughter    Alcohol abuse Son    Depression Son    Colon cancer Neg Hx     Current Outpatient Medications (Endocrine & Metabolic):    estradiol (VIVELLE-DOT) 0.075 MG/24HR, Place 1 patch onto the skin 2 (two) times [name] week. Wednesday and Sunday (Patient not taking: Reported on 02/07/2021)   predniSONE (DELTASONE) 10 MG tablet, Take 1 tablet three times [name] day for 2 days, then 1 tablet twice [name] day for 5 days, then 1 tablet daily till finished  Current Outpatient Medications (Cardiovascular):    atenolol (TENORMIN) 25 MG tablet, TAKE 1/2 TABLET (12.5 MG TOTAL) BY MOUTH DAILY. (Patient not taking: Reported on 02/07/2021)  Current Outpatient Medications (Respiratory):    fluticasone (FLONASE) 50 MCG/ACT nasal spray, Place 2 sprays into the nose daily.  Current Outpatient Medications (Analgesics):    HYDROcodone-acetaminophen (NORCO/VICODIN) 5-325 MG tablet, Take 1 tablet by by mouth every 6 hours as needed for pain (Patient not taking: Reported on 02/07/2021)  Current Outpatient Medications (Hematological):    cyanocobalamin (,VITAMIN B-12,) 1000 MCG/ML injection, INJECT 1 ML INTO THE MUSCLE EVERY 14 DAYS  Current Outpatient Medications (Other):    ALPRAZolam (XANAX) 0.5 MG tablet, TAKE 1 TABLET BY MOUTH  3 TIMES DAILY AS NEEDED FOR ANXIETY   B-D 3CC LUER-LOK SYR 25GX1" 25G X 1" 3 ML MISC, USE WITH CYANOCOBALAMIN EVERY 14 DAYS   cefUROXime (CEFTIN) 500 MG tablet, Take 1 tablet by mouth twice [name] day (Patient not taking: Reported on 02/07/2021)   cholecalciferol (VITAMIN D) 1000 UNITS tablet, Take 1,000 Units by mouth every evening.   clindamycin (CLEOCIN) 150 MG capsule, Take 1 capsule (150 mg total) by mouth 4 (four) times daily until gone (Patient not taking: Reported on 02/07/2021)   clobetasol (TEMOVATE) 0.05 % external solution, APPLY TOPICALLY ONCE DAILY AS NEEDED FOR DERMATITIS (Patient not taking: Reported on 02/07/2021)   cyclobenzaprine (FLEXERIL) 10 MG tablet, Take 1/2 to 1 tablet by mouth every 8 to 12 hours as needed for acute muscle spasms   fluorometholone (FML) 0.1 % ophthalmic suspension, 1 drop 3 (three) times daily. (Patient not taking: Reported on 02/07/2021)   FLUoxetine (PROZAC) 40 MG capsule, TAKE 1 CAPSULE BY MOUTH ONCE DAILY AFTER BREAKFAST   hydrocortisone (ANUSOL-HC) 2.5 % rectal cream, Place 1 application rectally 2 (two) times daily. (Patient not taking: Reported on 02/07/2021)   lamoTRIgine (LAMICTAL) 200 MG tablet, Take 1 tablet (200 mg total) by mouth at bedtime.   metroNIDAZOLE (FLAGYL) 500 MG tablet, Take 1 tablet by mouth twice [name] day (Patient not taking: Reported on 02/07/2021)   METRONIDAZOLE, TOPICAL, 0.75 % LOTN, USE EXTERNALLY ONCE DAILY AS DIRECTED   nitrofurantoin, macrocrystal-monohydrate, (MACROBID) 100 MG capsule, Take 1 capsule (100 mg total) by mouth 2 (two) times daily. (Patient not taking: Reported on 02/07/2021)   Olopatadine HCl 0.2 % SOLN,    omeprazole (PRILOSEC) 40 MG capsule, Take 1 capsule (40 mg total) by mouth daily before breakfast. (Patient taking differently: Take 40 mg by mouth as needed.)   PAZEO 0.7 % SOLN,  sucralfate (CARAFATE) 1 GM/10ML suspension, TAKE 10 MLS BY MOUTH 3 TIMES DAILY AS NEEDED FOR DIGESTION   SYRINGE-NEEDLE, DISP, 3 ML (B-D  3CC LUER-LOK SYR 25GX1") 25G X 1" 3 ML MISC, USE WITH CYANOCOBALAMIN EVERY 14 DAYS   temazepam (RESTORIL) 15 MG capsule, Take one to two capsules at bedtime as needed for sleep. (Patient not taking: Reported on 02/07/2021)   traZODone (DESYREL) 50 MG tablet, Take 1 tablet (50 mg total) by mouth at bedtime. (Patient not taking: Reported on 10/25/2020)   trimethoprim-polymyxin b (POLYTRIM) ophthalmic solution, PLACE 1 DROP INTO THE LEFT EYE EVERY 6 HOURS.   Reviewed prior external information including notes and imaging from  primary care provider As well as notes that were available from care everywhere and other healthcare systems.  Past medical history, social, surgical and family history all reviewed in electronic medical record.  No pertanent information unless stated regarding to the chief complaint.   Review of Systems:  No headache, visual changes, nausea, vomiting, diarrhea, constipation, dizziness, , skin rash, fevers, chills, night sweats, weight loss, swollen lymph nodes, body aches, joint swelling, chest pain, shortness of breath, mood changes. POSITIVE muscle aches, abdominal pain  Objective  Blood pressure 126/70, pulse 71, height '5\' 2"'$  (1.575 m), weight 114 lb (51.7 kg), SpO2 96 %.   General: No apparent distress alert and oriented x3 mood and affect normal, dressed appropriately.  HEENT: Pupils equal, extraocular movements intact  Respiratory: Patient's speak in full sentences and does not appear short of breath  Cardiovascular: No lower extremity edema, non tender, no erythema  Gait antalgic gait noted. MSK: Left hip exam does have fairly good range of motion but does have some mild pain with impingement with internal range of motion.  Patient does have mild pain with resisted extension.  Patient noted does have severe pain that is out of proportion to palpation in the inguinal area but no masses appreciated.  Patient continues to have discomfort though when any type of pressure  is in this area.  Seems worse then with pressure in this area with range of motion.  Negative straight leg test.   Limited muscular skeletal ultrasound was performed and interpreted by [name], M  Limited ultrasound in the area where patient does have more of the pain do not see more of the avulsion the patient does have what it seems to be [name] cortical irregularity that cannot tell if it is inside the hip joint or not.  Once again no patient's pain is out of proportion to the amount of palpation even to the lightest sensation of the probe.  No significant bulges noted with Valsalva in the inguinal canal.  No significant lymphadenopathy noted either. Impression: Nonspecific findings of the left hip     Impression and Recommendations:     The above documentation has been reviewed and is accurate and complete Lyndal Pulley, DO

## 2021-03-26 ENCOUNTER — Ambulatory Visit: Payer: 59 | Admitting: Psychiatry

## 2021-03-26 DIAGNOSIS — H16223 Keratoconjunctivitis sicca, not specified as Sjogren's, bilateral: Secondary | ICD-10-CM | POA: Diagnosis not present

## 2021-03-26 DIAGNOSIS — H10503 Unspecified blepharoconjunctivitis, bilateral: Secondary | ICD-10-CM | POA: Diagnosis not present

## 2021-03-26 DIAGNOSIS — H0102B Squamous blepharitis left eye, upper and lower eyelids: Secondary | ICD-10-CM | POA: Diagnosis not present

## 2021-03-26 DIAGNOSIS — H0102A Squamous blepharitis right eye, upper and lower eyelids: Secondary | ICD-10-CM | POA: Diagnosis not present

## 2021-03-26 DIAGNOSIS — H2513 Age-related nuclear cataract, bilateral: Secondary | ICD-10-CM | POA: Diagnosis not present

## 2021-03-27 ENCOUNTER — Other Ambulatory Visit (HOSPITAL_COMMUNITY): Payer: Self-pay

## 2021-03-27 ENCOUNTER — Other Ambulatory Visit: Payer: Self-pay

## 2021-03-27 ENCOUNTER — Encounter: Payer: Self-pay | Admitting: Adult Health

## 2021-03-27 ENCOUNTER — Ambulatory Visit (INDEPENDENT_AMBULATORY_CARE_PROVIDER_SITE_OTHER): Payer: 59 | Admitting: Adult Health

## 2021-03-27 DIAGNOSIS — G4700 Insomnia, unspecified: Secondary | ICD-10-CM

## 2021-03-27 DIAGNOSIS — F331 Major depressive disorder, recurrent, moderate: Secondary | ICD-10-CM | POA: Diagnosis not present

## 2021-03-27 DIAGNOSIS — F4310 Post-traumatic stress disorder, unspecified: Secondary | ICD-10-CM | POA: Diagnosis not present

## 2021-03-27 DIAGNOSIS — F410 Panic disorder [episodic paroxysmal anxiety] without agoraphobia: Secondary | ICD-10-CM | POA: Diagnosis not present

## 2021-03-27 DIAGNOSIS — F411 Generalized anxiety disorder: Secondary | ICD-10-CM

## 2021-03-27 MED ORDER — QUETIAPINE FUMARATE 50 MG PO TABS
ORAL_TABLET | ORAL | 1 refills | Status: DC
  Filled 2021-03-27: qty 180, 90d supply, fill #0

## 2021-03-27 NOTE — Progress Notes (Signed)
CAROLAN LIFE [id number] 1956-06-07 65 y.o.  Subjective:   Patient ID:  [name] is a 65 y.o. (DOB 1956-05-01) female.  Chief Complaint: No chief complaint on file.   HPI MAHDIYA LUCCI presents to the office today for follow-up of MDD, GAD, panic disorder, insomnia and PTSD.  Describes mood today as "better". Pleasant. Mood symptoms - denies depression and irritability. Feels anxious - "almost always". Stating - "I'm still not sleeping well". Recent tongue biopsy - negative. Home renovations complete. She and husband doing well. Married for 16 years. Improved interest and motivation. Seeing therapist - Rockne Menghini. Taking medications as prescribed.  Energy levels stable. Active, has a regular exercise routine - bike riding. Averages 60 miles a week. Enjoys some usual interests and activities. Married. Lives with husband of 16 years. Mother lives in [place]. Spending time with family. Appetite adequate. Weight loss - 114 pounds. Sleeping difficulties - mind racing at night. Averages 3 to 6 hours. Focus and concentration stable. Completing tasks. Managing aspects of household. 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 Denies SI or HI.  Denies AH or VH.  Previous medication trials: Remeron, Trazadone, Restoril, Xanax   GAD-7    Flowsheet Row Office Visit from 04/27/2019 in [facility] Visit from 12/29/2018 in [facility]  Total GAD-7 Score 11 18      Mini-Mental    Flowsheet Row Office Visit from 12/22/2016 in [facility]  Total Score (max 30 points ) 30      PHQ2-9    Flowsheet Row Office Visit from 05/23/2020 in [facility] Visit from 04/27/2019 in [facility] Visit from 12/29/2018 in [facility] Visit from 04/20/2018 in [facility] [place] Office Visit from 04/09/2017 in [facility] Total Score 0 0 5 0 0  PHQ-9 Total Score -- -- 17 4 2         Review of Systems:  Review of Systems  Medications: I have reviewed the patient's current medications.  Current Outpatient Medications  Medication Sig Dispense Refill   QUEtiapine (SEROQUEL) 50 MG tablet Take one to two tablets at bedtime for sleep. 180 tablet 1   ALPRAZolam (XANAX) 0.5 MG tablet TAKE 1 TABLET BY MOUTH 3 TIMES DAILY AS NEEDED FOR ANXIETY 270 tablet 1   B-D 3CC LUER-LOK SYR 25GX1" 25G X 1" 3 ML MISC USE WITH CYANOCOBALAMIN EVERY 14 DAYS     cholecalciferol (VITAMIN D) 1000 UNITS tablet Take 1,000 Units by mouth every evening.     clobetasol (TEMOVATE) 0.05 % external solution APPLY TOPICALLY ONCE DAILY AS NEEDED FOR DERMATITIS (Patient not taking: Reported on 02/07/2021) 50 mL 2   cyanocobalamin (,VITAMIN B-12,) 1000 MCG/ML injection INJECT 1 ML INTO THE MUSCLE EVERY 14 DAYS 6 mL 0   cyclobenzaprine (FLEXERIL) 10 MG tablet Take 1/2 to 1 tablet by mouth every 8 to 12 hours as needed for acute muscle spasms 30 tablet 2   fluorometholone (FML) 0.1 % ophthalmic suspension 1 drop 3 (three) times daily. (Patient not taking: Reported on 02/07/2021)     FLUoxetine (PROZAC) 40 MG capsule TAKE 1 CAPSULE BY MOUTH ONCE DAILY AFTER BREAKFAST 90 capsule 1   fluticasone (FLONASE) 50 MCG/ACT nasal spray Place 2 sprays into the nose daily. 16 g 5   HYDROcodone-acetaminophen (NORCO/VICODIN) 5-325 MG tablet Take 1  tablet by by mouth every 6 hours as needed for pain (Patient not taking: Reported on 02/07/2021) 10 tablet 0   hydrocortisone (ANUSOL-HC) 2.5 % rectal cream Place 1 application rectally 2 (two) times daily. (Patient not taking: Reported on 02/07/2021) 30 g 1   lamoTRIgine (LAMICTAL) 200 MG tablet Take 1 tablet (200 mg total) by mouth at bedtime. 90 tablet 3   METRONIDAZOLE, TOPICAL, 0.75 % LOTN USE EXTERNALLY ONCE DAILY AS DIRECTED 59 mL 3   Olopatadine HCl 0.2 % SOLN      omeprazole (PRILOSEC) 40 MG capsule Take 1  capsule (40 mg total) by mouth daily before breakfast. (Patient taking differently: Take 40 mg by mouth as needed.) 90 capsule 0   PAZEO 0.7 % SOLN  (Patient not taking: Reported on 02/07/2021)  98   predniSONE (DELTASONE) 10 MG tablet Take 1 tablet three times a day for 2 days, then 1 tablet twice a day for 5 days, then 1 tablet daily till finished 21 tablet 0   sucralfate (CARAFATE) 1 GM/10ML suspension TAKE 10 MLS BY MOUTH 3 TIMES DAILY AS NEEDED FOR DIGESTION 840 mL 2   SYRINGE-NEEDLE, DISP, 3 ML (B-D 3CC LUER-LOK SYR 25GX1") 25G X 1" 3 ML MISC USE WITH CYANOCOBALAMIN EVERY 14 DAYS 6 each 0   trimethoprim-polymyxin b (POLYTRIM) ophthalmic solution PLACE 1 DROP INTO THE LEFT EYE EVERY 6 HOURS. 10 mL 0   No current facility-administered medications for this visit.    Medication Side Effects: None  Allergies:  Allergies  Allergen Reactions   Aspartame And Phenylalanine Other (See Comments)    Migraines   Beef-Derived Products Other (See Comments)    severe cramping   Citrus Other (See Comments)    Causes Interstitial cystitis flares   Penicillins Hives    Denies airway involvement Has patient had a PCN reaction causing immediate rash, facial/tongue/throat swelling, SOB or lightheadedness with hypotension: yes hives.  Has patient had a PCN reaction causing severe rash involving mucus membranes or skin necrosis:no Has patient had a PCN reaction that required hospitalization No Has patient had a PCN reaction occurring within the last 10 years: No If all of the above answers are "NO", then may proceed with Cephalosporin use.    Promethazine Hcl Other (See Comments)    Muscle twitching and contracture    Adhesive [Tape]     Red splotches   Beta Vulgaris    Promethazine    Doxycycline Nausea And Vomiting   Morphine Sulfate Nausea And Vomiting    Past Medical History:  Diagnosis Date   Allergy    Anemia    after birth of child 23yrs ago   Anxiety and depression    with insomnia    Blood transfusion without reported diagnosis    x3   Chronic back pain    ? RA, ? Lupus - reports saw rheumatologist in the past but better now, sees Dr. Dutch Quint   Eczema    GERD (gastroesophageal reflux disease)    takes Omeprazole daily, with nausea   Heart murmur    Hemorrhoids    History of kidney stones    passed on her own   History of migraine    last one a yr ago and takes Zomig prn   Hot flashes 11/10/2014   HTN (hypertension) 03/31/2012   Hx of echocardiogram    Echo (8/15):  EF 55-60%, no RWMA   IBS (irritable bowel syndrome)    constipation predominant   Interstitial cystitis  hx of UTI   Numbness    both legs and related to back   Osteoporosis    osteopenia   Palpitations    on metoprolol in past but made BP too low   Pneumonia    hx of;last time 53yrs ago   PONV (postoperative nausea and vomiting)    laryngospasm-1999   Poor vision 10/15/2015   -? Ocular rosacea -sees opthomology    Raynaud's disease /phenomenon 10/21/2012   no meds, worse in the winter or cold environment.    Rheumatoid aortitis    uses Diclofenac gel;Rheumatoid   Seasonal allergies    Flonase daily    Urinary incontinence     Past Medical History, Surgical history, Social history, and Family history were reviewed and updated as appropriate.   Please see review of systems for further details on the patient's review from today.   Objective:   Physical Exam:  There were no vitals taken for this visit.  Physical Exam  Lab Review:     Component Value Date/Time   NA 139 05/23/2020 1011   NA 143 12/22/2016 0948   K 5.4 No hemolysis seen (H) 05/23/2020 1011   CL 102 05/23/2020 1011   CO2 33 (H) 05/23/2020 1011   GLUCOSE 81 05/23/2020 1011   GLUCOSE 95 04/21/2006 1635   BUN 14 05/23/2020 1011   BUN 17 12/22/2016 0948   CREATININE 1.04 05/23/2020 1011   CALCIUM 9.4 05/23/2020 1011   PROT 6.4 05/23/2020 1011   PROT 7.0 12/22/2016 0948   ALBUMIN 4.4 05/23/2020 1011   ALBUMIN  4.7 12/22/2016 0948   AST 21 05/23/2020 1011   ALT 14 05/23/2020 1011   ALKPHOS 70 05/23/2020 1011   BILITOT 0.5 05/23/2020 1011   BILITOT 0.3 12/22/2016 0948   GFRNONAA 65 12/22/2016 0948   GFRAA 75 12/22/2016 0948       Component Value Date/Time   WBC 4.0 05/23/2020 1011   RBC 4.60 05/23/2020 1011   HGB 14.0 05/23/2020 1011   HGB 14.1 12/22/2016 0948   HCT 42.8 05/23/2020 1011   HCT 44.0 12/22/2016 0948   PLT 181.0 05/23/2020 1011   PLT 206 12/22/2016 0948   MCV 93.1 05/23/2020 1011   MCV 94 12/22/2016 0948   MCH 30.3 12/22/2016 0948   MCH 30.7 11/01/2015 1154   MCHC 32.6 05/23/2020 1011   RDW 13.7 05/23/2020 1011   RDW 14.3 12/22/2016 0948   LYMPHSABS 1.0 04/27/2019 1010   LYMPHSABS 0.9 12/22/2016 0948   MONOABS 0.4 04/27/2019 1010   EOSABS 0.2 04/27/2019 1010   EOSABS 0.1 12/22/2016 0948   BASOSABS 0.0 04/27/2019 1010   BASOSABS 0.0 12/22/2016 0948    No results found for: POCLITH, LITHIUM   No results found for: PHENYTOIN, PHENOBARB, VALPROATE, CBMZ   .res Assessment: Plan:    Plan:  PDMP reviewed  1. Xanax 0.5mg  TID - using some at bedtime 2. Lamictal 200mg  daily 3. Prozac 40mg  daily  4. Add Seroquel 50mg  - 1 to 2 tablets.  RTC 3 months  Patient advised to contact office with any questions, adverse effects, or acute worsening in signs and symptoms.  Discussed potential benefits, risk, and side effects of benzodiazepines to include potential risk of tolerance and dependence, as well as possible drowsiness.  Advised patient not to drive if experiencing drowsiness and to take lowest possible effective dose to minimize risk of dependence and tolerance.  Diagnoses and all orders for this visit:  Insomnia, unspecified type -  QUEtiapine (SEROQUEL) 50 MG tablet; Take one to two tablets at bedtime for sleep.  Generalized anxiety disorder  PTSD (post-traumatic stress disorder)  Panic disorder  Moderate recurrent major depression (HCC)    Please  see After Visit Summary for patient specific instructions.  Future Appointments  Date Time Provider Department Center  04/03/2021  3:30 PM [facility] RM 3 [facility]  04/03/2021  4:00 PM [facility] MR 3 [facility]  04/03/2021  4:40 PM [facility]315 MR 3 [facility]  04/23/2021 11:00 AM Mathis Fare, LCSW CP-[facility] None  05/27/2021 10:30 AM Claiborne Billings, Renee A, DO [facility]  06/26/2021 10:00 AM Britiany Silbernagel, Thereasa Solo, NP CP-[facility] None    No orders of the defined types were placed in this encounter.   -------------------------------

## 2021-03-28 ENCOUNTER — Telehealth: Payer: Self-pay

## 2021-03-28 ENCOUNTER — Other Ambulatory Visit (HOSPITAL_COMMUNITY): Payer: Self-pay

## 2021-03-28 NOTE — Telephone Encounter (Signed)
Pt has been scheduled with Dr.Morrow 9/16 for evaluation.  [facility] - Client TELEPHONE ADVICE RECORD [facility] Patient Name: [name] Gender: Female DOB: October 31, 1955 Age: 65 Y 52 M 17 D Return Phone Number: [phone] (Primary), [phone] (Secondary) Address: City/State/Zip: [place] [address] Client [facility] - Client Client Site [facility] - Day Physician Raoul Pitch, [place] Contact Type Call Who Is Calling Patient / Member / Family / Caregiver Call Type Triage / Clinical Relationship To Patient Self Return Phone Number [phone] (Primary) Chief Complaint Headache Reason for Call Symptomatic / Request for [place] states she has been experiencing high blood of 140/82, headaches and nausea. Translation No Nurse Assessment Nurse: Eugenio Hoes, RN, Jenny Reichmann Date/Time (Eastern Time): 03/28/2021 12:03:59 PM Confirm and document reason for call. If symptomatic, describe symptoms. ---Caller states she has been experiencing high blood of 140/82 about an hour and a half ago, headache for about 3 days and nausea comes and goes. She does have a history of migraines. Does the patient have any new or worsening symptoms? ---Yes Will a triage be completed? ---Yes Related visit to physician within the last 2 weeks? ---No Does the PT have any chronic conditions? (i.e. diabetes, asthma, this includes High risk factors for pregnancy, etc.) ---No Is this a behavioral health or substance abuse call? ---No Guidelines Guideline Title Affirmed Question Affirmed Notes Nurse Date/Time (Eastern Time) Headache [1] MODERATE headache (e.g., interferes with normal activities) AND [2] present > 24 hours AND [3] unexplained (Exceptions: analgesics not tried, typical migraine, or headache part of viral illness) Eugenio Hoes, RN, Jenny Reichmann 03/28/2021 12:05:19 PM PLEASE NOTE: All timestamps  contained within this report are represented as [place] Standard Time. CONFIDENTIALTY NOTICE: This fax transmission is intended only for the addressee. It contains information that is legally privileged, confidential or otherwise protected from use or disclosure. If you are not the intended recipient, you are strictly prohibited from reviewing, disclosing, copying using or disseminating any of this information or taking any action in reliance on or regarding this information. If you have received this fax in error, please notify us immediately by telephone so that we can arrange for its return to [place]. Phone: [phone], Toll-Free: [phone], Fax: [phone] Page: 2 of 2 Call Id: [id number] [place]. Time [name] Time) Disposition Final User 03/28/2021 12:09:49 PM See PCP within 24 Hours Yes Eugenio Hoes, RN, Jenny Reichmann Caller Disagree/Comply Comply Caller Understands Yes PreDisposition Did not know what to do Care Advice Given Per Guideline SEE PCP WITHIN 24 HOURS: * IF OFFICE WILL BE OPEN: You need to be examined within the next 24 hours. Call your doctor (or NP/PA) when the office opens and make an appointment. PAIN MEDICINES: * ACETAMINOPHEN - REGULAR STRENGTH TYLENOL: Take 650 mg (two 325 mg pills) by mouth every 4 to 6 hours as needed. Each Regular Strength Tylenol pill has 325 mg of acetaminophen. The most you should take each day is 3,250 mg (10 pills a day). * IBUPROFEN (E.G., MOTRIN, ADVIL): Take 400 mg (two 200 mg pills) by mouth every 6 hours. The most you should take each day is 1,200 mg (six 200 mg pills), unless your doctor has told you to take more. CARE ADVICE given per Headache (Adult) guideline. Referrals REFERRED TO PCP OFFICE.

## 2021-03-29 ENCOUNTER — Other Ambulatory Visit (HOSPITAL_COMMUNITY): Payer: Self-pay

## 2021-03-29 ENCOUNTER — Encounter: Payer: Self-pay | Admitting: Registered Nurse

## 2021-03-29 ENCOUNTER — Other Ambulatory Visit: Payer: Self-pay

## 2021-03-29 ENCOUNTER — Ambulatory Visit: Payer: 59 | Admitting: Registered Nurse

## 2021-03-29 VITALS — BP 146/72 | HR 69 | Temp 98.2°F | Resp 18 | Ht 62.0 in | Wt 114.6 lb

## 2021-03-29 DIAGNOSIS — F419 Anxiety disorder, unspecified: Secondary | ICD-10-CM | POA: Diagnosis not present

## 2021-03-29 DIAGNOSIS — R7303 Prediabetes: Secondary | ICD-10-CM

## 2021-03-29 DIAGNOSIS — R002 Palpitations: Secondary | ICD-10-CM

## 2021-03-29 LAB — CBC WITH DIFFERENTIAL/PLATELET
Basophils Absolute: 0 10*3/uL (ref 0.0–0.1)
Basophils Relative: 0.7 % (ref 0.0–3.0)
Eosinophils Absolute: 0.2 10*3/uL (ref 0.0–0.7)
Eosinophils Relative: 3.7 % (ref 0.0–5.0)
HCT: 43.2 % (ref 36.0–46.0)
Hemoglobin: 14 g/dL (ref 12.0–15.0)
Lymphocytes Relative: 25.3 % (ref 12.0–46.0)
Lymphs Abs: 1.2 10*3/uL (ref 0.7–4.0)
MCHC: 32.4 g/dL (ref 30.0–36.0)
MCV: 93.3 fl (ref 78.0–100.0)
Monocytes Absolute: 0.5 10*3/uL (ref 0.1–1.0)
Monocytes Relative: 9.9 % (ref 3.0–12.0)
Neutro Abs: 2.9 10*3/uL (ref 1.4–7.7)
Neutrophils Relative %: 60.4 % (ref 43.0–77.0)
Platelets: 186 10*3/uL (ref 150.0–400.0)
RBC: 4.63 Mil/uL (ref 3.87–5.11)
RDW: 13.7 % (ref 11.5–15.5)
WBC: 4.7 10*3/uL (ref 4.0–10.5)

## 2021-03-29 LAB — COMPREHENSIVE METABOLIC PANEL
ALT: 13 U/L (ref 0–35)
AST: 20 U/L (ref 0–37)
Albumin: 4.6 g/dL (ref 3.5–5.2)
Alkaline Phosphatase: 81 U/L (ref 39–117)
BUN: 16 mg/dL (ref 6–23)
CO2: 30 mEq/L (ref 19–32)
Calcium: 9.8 mg/dL (ref 8.4–10.5)
Chloride: 99 mEq/L (ref 96–112)
Creatinine, Ser: 1.13 mg/dL (ref 0.40–1.20)
GFR: 51.37 mL/min — ABNORMAL LOW (ref >60.00)
Glucose, Bld: 80 mg/dL (ref 70–99)
Potassium: 4.7 mEq/L (ref 3.5–5.1)
Sodium: 138 mEq/L (ref 135–145)
Total Bilirubin: 0.5 mg/dL (ref 0.2–1.2)
Total Protein: 6.9 g/dL (ref 6.0–8.3)

## 2021-03-29 LAB — LIPID PANEL
Cholesterol: 215 mg/dL — ABNORMAL HIGH (ref 0–200)
HDL: 81.6 mg/dL (ref >39.00)
LDL Cholesterol: 117 mg/dL — ABNORMAL HIGH (ref 0–99)
NonHDL: 133.71
Total CHOL/HDL Ratio: 3
Triglycerides: 83 mg/dL (ref 0.0–149.0)
VLDL: 16.6 mg/dL (ref 0.0–40.0)

## 2021-03-29 LAB — URINALYSIS, ROUTINE W REFLEX MICROSCOPIC
Bilirubin Urine: NEGATIVE
Hgb urine dipstick: NEGATIVE
Ketones, ur: NEGATIVE
Nitrite: NEGATIVE
Specific Gravity, Urine: <=1.005 — AB (ref 1.000–1.030)
Total Protein, Urine: NEGATIVE
Urine Glucose: NEGATIVE
Urobilinogen, UA: 0.2 (ref 0.0–1.0)
pH: 6.5 (ref 5.0–8.0)

## 2021-03-29 LAB — TSH: TSH: 2.43 u[IU]/mL (ref 0.35–5.50)

## 2021-03-29 LAB — HEMOGLOBIN A1C: Hgb A1c MFr Bld: 6.1 % (ref 4.6–6.5)

## 2021-03-29 MED ORDER — HYDROXYZINE HCL 10 MG PO TABS
5.0000 mg | ORAL_TABLET | Freq: Three times a day (TID) | ORAL | 1 refills | Status: DC | PRN
  Filled 2021-03-29: qty 30, 10d supply, fill #0

## 2021-03-29 NOTE — Patient Instructions (Addendum)
Ms. Sugrue -  Extremely pleased with benign findings on visit today.  EKG wnl. No concerns on exam.  Favor some anxiety, perhaps some hypoglycemia or hypovolemia to contribute as well. Three square meals with snacks between and plenty of fluids this weekend! Get some rest, use hydroxyzine 5-'10mg'$  po tid PRN for anxiety.  Labs will be back this evening - I'll call with any acute concerns.   Ccing Dr. Raoul Pitch on today's visit - we'll reach out with any worries!  Thank you  Rich     If you have lab work done today you will be contacted with your lab results within the next 2 weeks.  If you have not heard from [place] then please contact us. The fastest way to get your results is to register for My Chart.   IF you received an x-ray today, you will receive an invoice from [facility]. Please contact [facility] at [phone] with questions or concerns regarding your invoice.   IF you received labwork today, you will receive an invoice from [facility]. Please contact [facility] at [phone] with questions or concerns regarding your invoice.   Our billing staff will not be able to assist you with questions regarding bills from these companies.  You will be contacted with the lab results as soon as they are available. The fastest way to get your results is to activate your My Chart account. Instructions are located on the last page of this paperwork. If you have not heard from [place] regarding the results in 2 weeks, please contact this office.

## 2021-03-29 NOTE — Progress Notes (Signed)
Established Patient Office Visit  Subjective:  Patient ID: [name], female    DOB: August 14, 1955  Age: 65 y.o. MRN: [id number]  CC:  Chief Complaint  Patient presents with   Headache    Patient has been experiencing headaches, nausea and hypertension for about 3 days.    HPI [name] presents for elevated bp  Onset three days ago Headaches dull and mild, sometimes up to 5/10 pain BP has been elevated, typically 90/50 Takes atenolol 12.5 mg po qd for palpitations  Notes palpitations have gotten worse - usually pretty infrequent, but coming more so with any activity.   Some lightheadedness, dizziness, nausea, no vomiting. No LOC Tuesday night was worst nausea.   Some constipation but stable  Hip pain, left side, greater trochanteric bursitis in past, this is baseline.   She is cyclist, rides a lot. Notes that she felt worse after a ride. Typical ride, usually 20 miles, sometimes up to 40.    Past Medical History:  Diagnosis Date   Allergy    Anemia    after birth of child 19yr ago   Anxiety and depression    with insomnia   Blood transfusion without reported diagnosis    x3   Chronic back pain    ? RA, ? Lupus - reports saw rheumatologist in the past but better now, sees Dr. PTrenton Gammon  Eczema    GERD (gastroesophageal reflux disease)    takes Omeprazole daily, with nausea   Heart murmur    Hemorrhoids    History of kidney stones    passed on her own   History of migraine    last one a yr ago and takes Zomig prn   Hot flashes 11/10/2014   HTN (hypertension) 03/31/2012   Hx of echocardiogram    Echo (8/15):  EF 55-60%, no RWMA   IBS (irritable bowel syndrome)    constipation predominant   Interstitial cystitis    hx of UTI   Numbness    both legs and related to back   Osteoporosis    osteopenia   Palpitations    on metoprolol in past but made BP too low   Pneumonia    hx of;last time 121yrago   PONV (postoperative nausea and vomiting)     laryngospasm-1999   Poor vision 10/15/2015   -? Ocular rosacea -sees opthomology    Raynaud's disease /phenomenon 10/21/2012   no meds, worse in the winter or cold environment.    Rheumatoid aortitis    uses Diclofenac gel;Rheumatoid   Seasonal allergies    Flonase daily    Urinary incontinence     Past Surgical History:  Procedure Laterality Date   ABDOMINAL HYSTERECTOMY  1997   BREAST BIOPSY  1999   benign   COLONOSCOPY  02/11/2013 and 12/24/04   internal and external hemorrhoids   ENDOMETRIAL ABLATION     EXCISION MORTON'S NEUROMA Right    FLEXIBLE SIGMOIDOSCOPY  03/07/2008   internal and external hemorrhoids   POSTERIOR LUMBAR FUSION  05/02/2013   L4-L5 fusion (cage/screws/bone graft) Dr. PoAnnette Stable TONSILLECTOMY     UPPER GASTROINTESTINAL ENDOSCOPY  10/23/2010   gastritis, irregular Z-line   wisdom teeth extracted      Family History  Problem Relation Age of Onset   Coronary artery disease Father    Heart attack Father    Hypertension Father    Depression Father    Hyperlipidemia Father    Alcohol abuse Father  Early death Father    Kidney disease Father    Mental illness Father    Diverticulosis Mother    Hypertension Mother    Osteoarthritis Mother    Depression Mother    Hyperlipidemia Mother    Kidney disease Mother    Alcohol abuse Sister    Depression Sister    Diabetes Paternal Uncle    Hypertension Maternal Grandmother    Stroke Maternal Grandmother    Hypertension Maternal Grandfather    Pancreatic cancer Maternal Grandfather    Early death Maternal Grandfather    Stroke Paternal Grandfather    Hyperlipidemia Paternal Grandfather    Alcohol abuse Paternal Grandfather    Early death Paternal Grandfather    Hypertension Paternal Grandfather    Depression Paternal Grandmother    Dementia Paternal Grandmother    Arthritis Paternal Grandmother    Mental illness Paternal Grandmother    Alcohol abuse Daughter    Depression Daughter    Drug abuse  Daughter    Alcohol abuse Son    Depression Son    Colon cancer Neg Hx     Social History   Socioeconomic History   Marital status: Married    Spouse name: Not on file   Number of children: Not on file   Years of education: Not on file   Highest education level: Not on file  Occupational History   Occupation: Programmer, multimedia: [employer]  Tobacco Use   Smoking status: Never   Smokeless tobacco: Never  Vaping Use   Vaping Use: Never used  Substance and Sexual Activity   Alcohol use: Yes    Alcohol/week: 2.0 standard drinks    Types: 1 Shots of liquor, 1 Standard drinks or equivalent per week    Comment: one margarita a week   Drug use: No   Sexual activity: Not Currently    Partners: Male  Other Topics Concern   Not on file  Social History Narrative   Spiritual Beliefs: methodist   Marital status/children/pets: In second marriage.  First husband committed suicide.  2 children.   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   Safety:      -Wears a bicycle helmet riding a bike: Yes     -smoke alarm in the home:No     - wears seatbelt: Yes     - Feels safe in their relationships: Yes            Social Determinants of Health   Financial Resource Strain: Not on file  Food Insecurity: Not on file  Transportation Needs: Not on file  Physical Activity: Not on file  Stress: Not on file  Social Connections: Not on file  Intimate Partner Violence: Not on file    Outpatient Medications Prior to Visit  Medication Sig Dispense Refill   ALPRAZolam (XANAX) 0.5 MG tablet TAKE 1 TABLET BY MOUTH 3 TIMES DAILY AS NEEDED FOR ANXIETY 270 tablet 1   B-D 3CC LUER-LOK SYR 25GX1" 25G X 1" 3 ML MISC USE WITH CYANOCOBALAMIN EVERY 14 DAYS     cholecalciferol (VITAMIN D) 1000 UNITS tablet Take 1,000 Units by mouth every evening.     cyanocobalamin (,VITAMIN B-12,) 1000 MCG/ML injection INJECT 1 ML INTO THE MUSCLE EVERY 14 DAYS 6 mL 0   cyclobenzaprine (FLEXERIL) 10 MG tablet Take  1/2 to 1 tablet by mouth every 8 to 12 hours as needed for acute muscle spasms 30 tablet 2   FLUoxetine (PROZAC) 40 MG capsule TAKE  1 CAPSULE BY MOUTH ONCE DAILY AFTER BREAKFAST 90 capsule 1   fluticasone (FLONASE) 50 MCG/ACT nasal spray Place 2 sprays into the nose daily. 16 g 5   lamoTRIgine (LAMICTAL) 200 MG tablet Take 1 tablet (200 mg total) by mouth at bedtime. 90 tablet 3   Olopatadine HCl 0.2 % SOLN      QUEtiapine (SEROQUEL) 50 MG tablet Take 1-2 tablets at bedtime for sleep. 180 tablet 1   sucralfate (CARAFATE) 1 GM/10ML suspension TAKE 10 MLS BY MOUTH 3 TIMES DAILY AS NEEDED FOR DIGESTION 840 mL 2   clobetasol (TEMOVATE) 0.05 % external solution APPLY TOPICALLY ONCE DAILY AS NEEDED FOR DERMATITIS (Patient not taking: No sig reported) 50 mL 2   fluorometholone (FML) 0.1 % ophthalmic suspension 1 drop 3 (three) times daily. (Patient not taking: No sig reported)     HYDROcodone-acetaminophen (NORCO/VICODIN) 5-325 MG tablet Take 1 tablet by by mouth every 6 hours as needed for pain (Patient not taking: No sig reported) 10 tablet 0   hydrocortisone (ANUSOL-HC) 2.5 % rectal cream Place 1 application rectally 2 (two) times daily. (Patient not taking: No sig reported) 30 g 1   METRONIDAZOLE, TOPICAL, 0.75 % LOTN USE EXTERNALLY ONCE DAILY AS DIRECTED (Patient not taking: Reported on 03/29/2021) 59 mL 3   omeprazole (PRILOSEC) 40 MG capsule Take 1 capsule (40 mg total) by mouth daily before breakfast. (Patient not taking: Reported on 03/29/2021) 90 capsule 0   PAZEO 0.7 % SOLN  (Patient not taking: No sig reported)  98   predniSONE (DELTASONE) 10 MG tablet Take 1 tablet three times a day for 2 days, then 1 tablet twice a day for 5 days, then 1 tablet daily till finished (Patient not taking: Reported on 03/29/2021) 21 tablet 0   SYRINGE-NEEDLE, DISP, 3 ML (B-D 3CC LUER-LOK SYR 25GX1") 25G X 1" 3 ML MISC USE WITH CYANOCOBALAMIN EVERY 14 DAYS (Patient not taking: Reported on 03/29/2021) 6 each 0    trimethoprim-polymyxin b (POLYTRIM) ophthalmic solution PLACE 1 DROP INTO THE LEFT EYE EVERY 6 HOURS. (Patient not taking: Reported on 03/29/2021) 10 mL 0   No facility-administered medications prior to visit.    Allergies  Allergen Reactions   Aspartame And Phenylalanine Other (See Comments)    Migraines   Beef-Derived Products Other (See Comments)    severe cramping   Citrus Other (See Comments)    Causes Interstitial cystitis flares   Penicillins Hives    Denies airway involvement Has patient had a PCN reaction causing immediate rash, facial/tongue/throat swelling, SOB or lightheadedness with hypotension: yes hives.  Has patient had a PCN reaction causing severe rash involving mucus membranes or skin necrosis:no Has patient had a PCN reaction that required hospitalization No Has patient had a PCN reaction occurring within the last 10 years: No If all of the above answers are "NO", then may proceed with Cephalosporin use.    Promethazine Hcl Other (See Comments)    Muscle twitching and contracture    Adhesive [Tape]     Red splotches   Beta Vulgaris    Promethazine    Doxycycline Nausea And Vomiting   Morphine Sulfate Nausea And Vomiting    ROS Review of Systems  Constitutional:  Positive for fatigue. Negative for activity change, appetite change, chills, diaphoresis, fever and unexpected weight change.  HENT: Negative.    Eyes: Negative.   Respiratory: Negative.    Cardiovascular:  Positive for palpitations. Negative for chest pain and leg swelling.  Gastrointestinal:  Positive for nausea. Negative for abdominal distention, abdominal pain, anal bleeding, blood in stool, constipation, diarrhea, rectal pain and vomiting.  Genitourinary: Negative.   Musculoskeletal: Negative.   Skin: Negative.   Neurological: Negative.   Psychiatric/Behavioral:  Negative for agitation, behavioral problems, confusion, decreased concentration, dysphoric mood, hallucinations, self-injury, sleep  disturbance and suicidal ideas. The patient is nervous/anxious (mostly regarding work Investment banker, corporate), but now about symptoms). The patient is not hyperactive.   All other systems reviewed and are negative.    Objective:    Physical Exam Vitals and nursing note reviewed.  Constitutional:      General: She is not in acute distress.    Appearance: Normal appearance. She is normal weight. She is not ill-appearing, toxic-appearing or diaphoretic.  Eyes:     General: No visual field deficit. Cardiovascular:     Rate and Rhythm: Normal rate and regular rhythm.     Heart sounds: Normal heart sounds. No murmur heard.   No friction rub. No gallop.  Pulmonary:     Effort: Pulmonary effort is normal. No respiratory distress.     Breath sounds: Normal breath sounds. No stridor. No wheezing, rhonchi or rales.  Chest:     Chest wall: No tenderness.  Skin:    General: Skin is warm and dry.  Neurological:     General: No focal deficit present.     Mental Status: She is alert and oriented to person, place, and time. Mental status is at baseline.     GCS: GCS eye subscore is 4. GCS verbal subscore is 5. GCS motor subscore is 6.     Cranial Nerves: No cranial nerve deficit, dysarthria or facial asymmetry.     Sensory: No sensory deficit.     Motor: No weakness.     Coordination: Romberg sign negative. Coordination normal.     Gait: Gait normal.     Deep Tendon Reflexes: Reflexes normal.  Psychiatric:        Mood and Affect: Mood is anxious. Mood is not depressed.        Speech: Speech normal.        Behavior: Behavior normal. Behavior is not agitated.        Thought Content: Thought content normal.        Cognition and Memory: Cognition is not impaired. Memory is not impaired.        Judgment: Judgment normal.    BP (!) 146/72   Pulse 69   Temp 98.2 F (36.8 C) (Temporal)   Resp 18   Ht '5\' 2"'$  (1.575 m)   Wt 114 lb 9.6 oz (52 kg)   SpO2 100%   BMI 20.96 kg/m  Wt Readings from Last 3 Encounters:   03/29/21 114 lb 9.6 oz (52 kg)  03/13/21 114 lb (51.7 kg)  02/07/21 114 lb 9.6 oz (52 kg)     There are no preventive care reminders to display for this patient.  There are no preventive care reminders to display for this patient.  Lab Results  Component Value Date   TSH 1.94 05/23/2020   Lab Results  Component Value Date   WBC 4.0 05/23/2020   HGB 14.0 05/23/2020   HCT 42.8 05/23/2020   MCV 93.1 05/23/2020   PLT 181.0 05/23/2020   Lab Results  Component Value Date   NA 139 05/23/2020   K 5.4 No hemolysis seen (H) 05/23/2020   CO2 33 (H) 05/23/2020   GLUCOSE 81 05/23/2020   BUN 14 05/23/2020  CREATININE 1.04 05/23/2020   BILITOT 0.5 05/23/2020   ALKPHOS 70 05/23/2020   AST 21 05/23/2020   ALT 14 05/23/2020   PROT 6.4 05/23/2020   ALBUMIN 4.4 05/23/2020   CALCIUM 9.4 05/23/2020   ANIONGAP 10 11/01/2015   GFR 57.09 (L) 05/23/2020   Lab Results  Component Value Date   CHOL 202 (H) 05/23/2020   Lab Results  Component Value Date   HDL 78.40 05/23/2020   Lab Results  Component Value Date   LDLCALC 108 (H) 05/23/2020   Lab Results  Component Value Date   TRIG 78.0 05/23/2020   Lab Results  Component Value Date   CHOLHDL 3 05/23/2020   Lab Results  Component Value Date   HGBA1C 6.1 05/23/2020      Assessment & Plan:   Problem List Items Addressed This Visit   None Visit Diagnoses     Palpitations    -  Primary   Relevant Orders   EKG 12-Lead (Completed)   CBC with Differential/Platelet   TSH   Comprehensive metabolic panel   Hemoglobin A1c   Lipid panel   Urinalysis   Urine Culture   Prediabetes       Relevant Orders   Hemoglobin A1c   Lipid panel   Urinalysis   Urine Culture       No orders of the defined types were placed in this encounter.   Follow-up: Return if symptoms worsen or fail to improve.   PLAN Ekg compared to 01/25/2014. Past Ekg shows borderline R axis, essentially normal. Today's ekg shows no changes. RRR, no  acute abnormality. Unremarkable ekg. Labs to rule out hem or metabolic abnormality. Suggest low and slow return to exercise. Ok to increase atenolol to '25mg'$  for next few days with close monitoring of BP and pulse. Decrease dose if either drops too low.  Hydroxyzine for anxiety - use 5-'10mg'$  po tid prn for anxiety. Reviewed risks, benefits, side effects.  Close follow up with PCP. ER precautions reviewed with patient who voices understanding. Patient encouraged to call clinic with any questions, comments, or concerns.  Maximiano Coss, NP

## 2021-03-31 LAB — URINE CULTURE
MICRO NUMBER:: 12385706
SPECIMEN QUALITY:: ADEQUATE

## 2021-04-03 ENCOUNTER — Other Ambulatory Visit: Payer: Self-pay

## 2021-04-03 ENCOUNTER — Ambulatory Visit
Admission: RE | Admit: 2021-04-03 | Discharge: 2021-04-03 | Disposition: A | Discharge status: Home / Self Care | Payer: 59 | Source: Ambulatory Visit | Attending: Family Medicine | Admitting: Family Medicine

## 2021-04-03 DIAGNOSIS — S43432A Superior glenoid labrum lesion of left shoulder, initial encounter: Secondary | ICD-10-CM | POA: Diagnosis not present

## 2021-04-03 DIAGNOSIS — M25552 Pain in left hip: Secondary | ICD-10-CM

## 2021-04-03 DIAGNOSIS — M6289 Other specified disorders of muscle: Secondary | ICD-10-CM | POA: Diagnosis not present

## 2021-04-03 MED ORDER — IOPAMIDOL (ISOVUE-M 200) INJECTION 41%
15.0000 mL | Freq: Once | INTRAMUSCULAR | Status: AC
  Administered 2021-04-03: 15 mL via INTRA_ARTICULAR

## 2021-04-03 MED ORDER — GADOBENATE DIMEGLUMINE 529 MG/ML IV SOLN
11.0000 mL | Freq: Once | INTRAVENOUS | Status: AC | PRN
  Administered 2021-04-03: 11 mL via INTRAVENOUS

## 2021-04-05 ENCOUNTER — Encounter: Payer: Self-pay | Admitting: Family Medicine

## 2021-04-08 ENCOUNTER — Other Ambulatory Visit: Payer: Self-pay

## 2021-04-08 DIAGNOSIS — M25552 Pain in left hip: Secondary | ICD-10-CM

## 2021-04-17 DIAGNOSIS — M25552 Pain in left hip: Secondary | ICD-10-CM | POA: Diagnosis not present

## 2021-04-17 DIAGNOSIS — M24152 Other articular cartilage disorders, left hip: Secondary | ICD-10-CM | POA: Diagnosis not present

## 2021-04-23 ENCOUNTER — Other Ambulatory Visit: Payer: Self-pay

## 2021-04-23 ENCOUNTER — Ambulatory Visit (INDEPENDENT_AMBULATORY_CARE_PROVIDER_SITE_OTHER): Payer: 59 | Admitting: Psychiatry

## 2021-04-23 DIAGNOSIS — F411 Generalized anxiety disorder: Secondary | ICD-10-CM
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
## 2021-05-01 ENCOUNTER — Other Ambulatory Visit: Payer: Self-pay | Admitting: Obstetrics & Gynecology

## 2021-05-01 DIAGNOSIS — Z1231 Encounter for screening mammogram for malignant neoplasm of breast: Secondary | ICD-10-CM

## 2021-05-15 DIAGNOSIS — M24152 Other articular cartilage disorders, left hip: Secondary | ICD-10-CM | POA: Diagnosis not present

## 2021-05-16 DIAGNOSIS — M4310 Spondylolisthesis, site unspecified: Secondary | ICD-10-CM | POA: Diagnosis not present

## 2021-05-17 ENCOUNTER — Other Ambulatory Visit: Payer: Self-pay | Admitting: Neurosurgery

## 2021-05-17 DIAGNOSIS — M4310 Spondylolisthesis, site unspecified: Secondary | ICD-10-CM

## 2021-05-20 ENCOUNTER — Other Ambulatory Visit: Payer: Self-pay | Admitting: Adult Health

## 2021-05-20 ENCOUNTER — Other Ambulatory Visit (HOSPITAL_COMMUNITY): Payer: Self-pay

## 2021-05-20 DIAGNOSIS — F410 Panic disorder [episodic paroxysmal anxiety] without agoraphobia: Secondary | ICD-10-CM

## 2021-05-20 DIAGNOSIS — F4310 Post-traumatic stress disorder, unspecified: Secondary | ICD-10-CM

## 2021-05-21 ENCOUNTER — Other Ambulatory Visit (HOSPITAL_COMMUNITY): Payer: Self-pay

## 2021-05-21 MED ORDER — ALPRAZOLAM 0.5 MG PO TABS
ORAL_TABLET | Freq: Three times a day (TID) | ORAL | 1 refills | Status: DC | PRN
  Filled 2021-05-21: qty 270, 90d supply, fill #0

## 2021-05-21 NOTE — Telephone Encounter (Signed)
Last filled 7/27 appt on 12/14

## 2021-05-27 ENCOUNTER — Encounter: Payer: Self-pay | Admitting: Family Medicine

## 2021-05-27 ENCOUNTER — Other Ambulatory Visit: Payer: Self-pay

## 2021-05-27 ENCOUNTER — Ambulatory Visit (INDEPENDENT_AMBULATORY_CARE_PROVIDER_SITE_OTHER): Payer: 59 | Admitting: Family Medicine

## 2021-05-27 ENCOUNTER — Other Ambulatory Visit (HOSPITAL_COMMUNITY): Payer: Self-pay

## 2021-05-27 VITALS — BP 106/62 | HR 61 | Temp 98.3°F | Ht 62.0 in | Wt 116.0 lb

## 2021-05-27 DIAGNOSIS — R002 Palpitations: Secondary | ICD-10-CM

## 2021-05-27 DIAGNOSIS — Z79899 Other long term (current) drug therapy: Secondary | ICD-10-CM

## 2021-05-27 DIAGNOSIS — R232 Flushing: Secondary | ICD-10-CM

## 2021-05-27 DIAGNOSIS — Z0000 Encounter for general adult medical examination without abnormal findings: Secondary | ICD-10-CM

## 2021-05-27 DIAGNOSIS — E538 Deficiency of other specified B group vitamins: Secondary | ICD-10-CM | POA: Diagnosis not present

## 2021-05-27 DIAGNOSIS — Z0001 Encounter for general adult medical examination with abnormal findings: Secondary | ICD-10-CM

## 2021-05-27 HISTORY — DX: Flushing: R23.2

## 2021-05-27 LAB — BASIC METABOLIC PANEL
BUN: 25 mg/dL — ABNORMAL HIGH (ref 6–23)
CO2: 30 mEq/L (ref 19–32)
Calcium: 10 mg/dL (ref 8.4–10.5)
Chloride: 102 mEq/L (ref 96–112)
Creatinine, Ser: 1.04 mg/dL (ref 0.40–1.20)
GFR: 56.69 mL/min — ABNORMAL LOW (ref >60.00)
Glucose, Bld: 80 mg/dL (ref 70–99)
Potassium: 5 mEq/L (ref 3.5–5.1)
Sodium: 141 mEq/L (ref 135–145)

## 2021-05-27 LAB — TSH: TSH: 2.53 u[IU]/mL (ref 0.35–5.50)

## 2021-05-27 LAB — VITAMIN B12: Vitamin B-12: 497 pg/mL (ref 211–911)

## 2021-05-27 LAB — T3, FREE: T3, Free: 3.5 pg/mL (ref 2.3–4.2)

## 2021-05-27 MED ORDER — OMEPRAZOLE 40 MG PO CPDR
40.0000 mg | DELAYED_RELEASE_CAPSULE | Freq: Every day | ORAL | 1 refills | Status: DC
  Filled 2021-05-27: qty 90, 90d supply, fill #0

## 2021-05-27 MED ORDER — CLOBETASOL PROPIONATE 0.05 % EX SOLN
CUTANEOUS | 2 refills | Status: AC
  Filled 2021-05-27: qty 50, 30d supply, fill #0

## 2021-05-27 MED ORDER — ATENOLOL 25 MG PO TABS
12.5000 mg | ORAL_TABLET | Freq: Every day | ORAL | 1 refills | Status: DC | PRN
  Filled 2021-05-27: qty 45, 90d supply, fill #0
  Filled 2021-07-24 – 2021-08-01 (×2): qty 45, 90d supply, fill #1

## 2021-05-27 NOTE — Patient Instructions (Signed)
°Great to see you today.  °I have refilled the medication(s) we provide.  ° °If labs were collected, we will inform you of lab results once received either by echart message or telephone call.  ° - echart message- for normal results that have been seen by the patient already.  ° - telephone call: abnormal results or if patient has not viewed results in their echart. ° °Health Maintenance, Female °Adopting a healthy lifestyle and getting preventive care are important in promoting health and wellness. Ask your health care provider about: °The right schedule for you to have regular tests and exams. °Things you can do on your own to prevent diseases and keep yourself healthy. °What should I know about diet, weight, and exercise? °Eat a healthy diet ° °Eat a diet that includes plenty of vegetables, fruits, low-fat dairy products, and lean protein. °Do not eat a lot of foods that are high in solid fats, added sugars, or sodium. °Maintain a healthy weight °Body mass index (BMI) is used to identify weight problems. It estimates body fat based on height and weight. Your health care provider can help determine your BMI and help you achieve or maintain a healthy weight. °Get regular exercise °Get regular exercise. This is one of the most important things you can do for your health. Most adults should: °Exercise for at least 150 minutes each week. The exercise should increase your heart rate and make you sweat (moderate-intensity exercise). °Do strengthening exercises at least twice a week. This is in addition to the moderate-intensity exercise. °Spend less time sitting. Even light physical activity can be beneficial. °Watch cholesterol and blood lipids °Have your blood tested for lipids and cholesterol at 64 years of age, then have this test every 5 years. °Have your cholesterol levels checked more often if: °Your lipid or cholesterol levels are high. °You are older than 64 years of age. °You are at high risk for heart  disease. °What should I know about cancer screening? °Depending on your health history and family history, you may need to have cancer screening at various ages. This may include screening for: °Breast cancer. °Cervical cancer. °Colorectal cancer. °Skin cancer. °Lung cancer. °What should I know about heart disease, diabetes, and high blood pressure? °Blood pressure and heart disease °High blood pressure causes heart disease and increases the risk of stroke. This is more likely to develop in people who have high blood pressure readings or are overweight. °Have your blood pressure checked: °Every 3-5 years if you are 18-39 years of age. °Every year if you are 40 years old or older. °Diabetes °Have regular diabetes screenings. This checks your fasting blood sugar level. Have the screening done: °Once every three years after age 40 if you are at a normal weight and have a low risk for diabetes. °More often and at a younger age if you are overweight or have a high risk for diabetes. °What should I know about preventing infection? °Hepatitis B °If you have a higher risk for hepatitis B, you should be screened for this virus. Talk with your health care provider to find out if you are at risk for hepatitis B infection. °Hepatitis C °Testing is recommended for: °Everyone born from 1945 through 1965. °Anyone with known risk factors for hepatitis C. °Sexually transmitted infections (STIs) °Get screened for STIs, including gonorrhea and chlamydia, if: °You are sexually active and are younger than 64 years of age. °You are older than 64 years of age and your health care provider   tells you that you are at risk for this type of infection. °Your sexual activity has changed since you were last screened, and you are at increased risk for chlamydia or gonorrhea. Ask your health care provider if you are at risk. °Ask your health care provider about whether you are at high risk for HIV. Your health care provider may recommend a  prescription medicine to help prevent HIV infection. If you choose to take medicine to prevent HIV, you should first get tested for HIV. You should then be tested every 3 months for as long as you are taking the medicine. °Pregnancy °If you are about to stop having your period (premenopausal) and you may become pregnant, seek counseling before you get pregnant. °Take 400 to 800 micrograms (mcg) of folic acid every day if you become pregnant. °Ask for birth control (contraception) if you want to prevent pregnancy. °Osteoporosis and menopause °Osteoporosis is a disease in which the bones lose minerals and strength with aging. This can result in bone fractures. If you are 65 years old or older, or if you are at risk for osteoporosis and fractures, ask your health care provider if you should: °Be screened for bone loss. °Take a calcium or vitamin D supplement to lower your risk of fractures. °Be given hormone replacement therapy (HRT) to treat symptoms of menopause. °Follow these instructions at home: °Alcohol use °Do not drink alcohol if: °Your health care provider tells you not to drink. °You are pregnant, may be pregnant, or are planning to become pregnant. °If you drink alcohol: °Limit how much you have to: °0-1 drink a day. °Know how much alcohol is in your drink. In the [place] one drink equals one 12 oz bottle of beer (355 mL), one 5 oz glass of wine (148 mL), or one 1½ oz glass of hard liquor (44 mL). °Lifestyle °Do not use any products that contain nicotine or tobacco. These products include cigarettes, chewing tobacco, and vaping devices, such as e-cigarettes. If you need help quitting, ask your health care provider. °Do not use street drugs. °Do not share needles. °Ask your health care provider for help if you need support or information about quitting drugs. °General instructions °Schedule regular health, dental, and eye exams. °Stay current with your vaccines. °Tell your health care provider if: °You often  feel depressed. °You have ever been abused or do not feel safe at home. °Summary °Adopting a healthy lifestyle and getting preventive care are important in promoting health and wellness. °Follow your health care provider's instructions about healthy diet, exercising, and getting tested or screened for diseases. °Follow your health care provider's instructions on monitoring your cholesterol and blood pressure. °This information is not intended to replace advice given to you by your health care provider. Make sure you discuss any questions you have with your health care provider. °Document Revised: 11/19/2020 Document Reviewed: 11/19/2020 °Elsevier Patient Education © 2022 [employer]. ° °

## 2021-05-27 NOTE — Progress Notes (Signed)
This visit occurred during the SARS-CoV-2 public health emergency.  Safety protocols were in place, including screening questions prior to the visit, additional usage of staff PPE, and extensive cleaning of exam room while observing appropriate contact time as indicated for disinfecting solutions.    Patient ID: [name], female  DOB: 27-Jul-1955, 65 y.o.   MRN: [id number] Patient Care Team    Relationship Specialty Notifications Start End  Ma Hillock, DO PCP - General Family Medicine  12/29/18   Gavin Pound, MD Consulting Physician Rheumatology  04/09/17   Maisie Fus, MD Consulting Physician Obstetrics and Gynecology  04/09/17   Devra Dopp, MD Referring Physician Dermatology  04/09/17   Group, [place] [facility]  12/29/18    Comment: [facility]    12/29/18   [name], MD Consulting Physician Gastroenterology  12/29/18   Earnie Larsson, MD Consulting Physician Neurosurgery  12/29/18     Chief Complaint  Patient presents with   Annual Exam    Pt is not fasting    Subjective: [name] is a 65 y.o.  Female  present for CPE/cmc. All past medical history, surgical history, allergies, family history, immunizations, medications and social history were updated in the electronic medical record today. All recent labs, ED visits and hospitalizations within the last year were reviewed.  Health maintenance:  Colonoscopy: completed 2014, 10 yr follow up [name]. Mammogram: scheduled 07/12/2020> [facility] scheduled 07/16/2021 Cervical cancer screening: last pap: 2017- Dr. Nori Riis -gyn Immunizations: tdap UTD 2015, Influenza UTD 10/22 (encouraged yearly),shingrix completed, covid series completed Infectious disease screening: Hep C completed DEXA: last completed 2020 at gyn Assistive device: none Oxygen CBS:WHQP Patient has a Dental home. Hospitalizations/ED visits: reviewed  Hot flashes: Patient  reports new symptoms of worsening hot flashes.  She states these hot flashes seem to only occur during the day.  Are not associated with anything in particular including meals or activity.  She reports it will just come on quickly and last a few minutes causing her to sweat.  She reports she is unsure if her face flushes or if it is just a feeling of flushing.  She reports others do noticed that she has had a change in temperature.  She denies any shortness of breath, dizziness, palpitations, chest discomfort, bladder or bowel changes during this time.  She feels the symptoms have been progressively stronger over the last 8 months.  She also does not take a baby aspirin or feel this is a associated in times when she uses her atenolol.  Depression screen [facility] 2/9 05/27/2021 03/29/2021 05/23/2020 04/27/2019 12/29/2018  Decreased Interest 0 0 0 0 2  Down, Depressed, Hopeless 0 0 0 0 3  PHQ - 2 Score 0 0 0 0 5  Altered sleeping - 0 - - 2  Tired, decreased energy - 0 - - 1  Change in appetite - 0 - - 3  Feeling bad or failure about yourself  - 0 - - 1  Trouble concentrating - 0 - - 2  Moving slowly or fidgety/restless - 0 - - 2  Suicidal thoughts - 0 - - 1  PHQ-9 Score - 0 - - 17  Difficult doing work/chores - Not difficult at all - - Extremely dIfficult  Some recent data might be hidden   GAD 7 : Generalized Anxiety Score 04/27/2019 12/29/2018  Nervous, Anxious, on Edge 1 3  Control/stop worrying 2 3  Worry  too much - different things 2 3  Trouble relaxing 1 2  Restless 1 2  Easily annoyed or irritable 2 3  Afraid - awful might happen 2 2  Total GAD 7 Score 11 18  Anxiety Difficulty Somewhat difficult Extremely difficult     Immunization History  Administered Date(s) Administered   Influenza,inj,Quad PF,6+ Mos 04/12/2014   Influenza-Unspecified 05/15/2015, 03/28/2018, 04/20/2020, 04/13/2021   PFIZER(Purple Top)SARS-COV-2 Vaccination 08/04/2019, 08/23/2019   Td 07/15/2003   Tdap 01/11/2021    Zoster Recombinat (Shingrix) 04/27/2019, 09/26/2019   Past Medical History:  Diagnosis Date   Allergy    Anemia    after birth of child 35yrs ago   Anxiety and depression    with insomnia   Blood transfusion without reported diagnosis    x3   Chronic back pain    ? RA, ? Lupus - reports saw rheumatologist in the past but better now, sees Dr. Trenton Gammon   Eczema    GERD (gastroesophageal reflux disease)    takes Omeprazole daily, with nausea   Heart murmur    Hemorrhoids    History of kidney stones    passed on her own   History of migraine    last one a yr ago and takes Zomig prn   Hot flashes 11/10/2014   HTN (hypertension) 03/31/2012   Hx of echocardiogram    Echo (8/15):  EF 55-60%, no RWMA   IBS (irritable bowel syndrome)    constipation predominant   Interstitial cystitis    hx of UTI   Numbness    both legs and related to back   Osteoporosis    osteopenia   Palpitations    on metoprolol in past but made BP too low   Pneumonia    hx of;last time 71yrs ago   PONV (postoperative nausea and vomiting)    laryngospasm-1999   Poor vision 10/15/2015   -? Ocular rosacea -sees opthomology    Raynaud's disease /phenomenon 10/21/2012   no meds, worse in the winter or cold environment.    Rheumatoid aortitis    uses Diclofenac gel;Rheumatoid   Seasonal allergies    Flonase daily    Urinary incontinence    Allergies  Allergen Reactions   Aspartame And Phenylalanine Other (See Comments)    Migraines   Beef-Derived Products Other (See Comments)    severe cramping   Citrus Other (See Comments)    Causes Interstitial cystitis flares   Penicillins Hives and Rash    Denies airway involvement Has patient had a PCN reaction causing immediate rash, facial/tongue/throat swelling, SOB or lightheadedness with hypotension: yes hives.  Has patient had a PCN reaction causing severe rash involving mucus membranes or skin necrosis:no Has patient had a PCN reaction that required  hospitalization No Has patient had a PCN reaction occurring within the last 10 years: No If all of the above answers are "NO", then may proceed with Cephalosporin use.    Promethazine Hcl Other (See Comments)    Muscle twitching and contracture    Adhesive [Tape]     Red splotches   Beta Vulgaris    Promethazine    Doxycycline Nausea And Vomiting   Morphine Sulfate Nausea And Vomiting   Past Surgical History:  Procedure Laterality Date   ABDOMINAL HYSTERECTOMY  1997   BREAST BIOPSY  1999   benign   COLONOSCOPY  02/11/2013 and 12/24/04   internal and external hemorrhoids   ENDOMETRIAL ABLATION     EXCISION MORTON'S NEUROMA Right  FLEXIBLE SIGMOIDOSCOPY  03/07/2008   internal and external hemorrhoids   POSTERIOR LUMBAR FUSION  05/02/2013   L4-L5 fusion (cage/screws/bone graft) Dr. Annette Stable   TONSILLECTOMY     UPPER GASTROINTESTINAL ENDOSCOPY  10/23/2010   gastritis, irregular Z-line   wisdom teeth extracted     Family History  Problem Relation Age of Onset   Coronary artery disease Father    Heart attack Father    Hypertension Father    Depression Father    Hyperlipidemia Father    Alcohol abuse Father    Early death Father    Kidney disease Father    Mental illness Father    Diverticulosis Mother    Hypertension Mother    Osteoarthritis Mother    Depression Mother    Hyperlipidemia Mother    Kidney disease Mother    Alcohol abuse Sister    Depression Sister    Diabetes Paternal Uncle    Hypertension Maternal Grandmother    Stroke Maternal Grandmother    Hypertension Maternal Grandfather    Pancreatic cancer Maternal Grandfather    Early death Maternal Grandfather    Stroke Paternal Grandfather    Hyperlipidemia Paternal Grandfather    Alcohol abuse Paternal Grandfather    Early death Paternal Grandfather    Hypertension Paternal Grandfather    Depression Paternal Grandmother    Dementia Paternal Grandmother    Arthritis Paternal Grandmother    Mental illness  Paternal Grandmother    Alcohol abuse Daughter    Depression Daughter    Drug abuse Daughter    Alcohol abuse Son    Depression Son    Colon cancer Neg Hx    Social History   Social History Narrative   Spiritual Beliefs: methodist   Marital status/children/pets: In second marriage.  First husband committed suicide.  2 children.   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   Safety:      -Wears a bicycle helmet riding a bike: Yes     -smoke alarm in the home:No     - wears seatbelt: Yes     - Feels safe in their relationships: Yes             Allergies as of 05/27/2021       Reactions   Aspartame And Phenylalanine Other (See Comments)   Migraines   Beef-derived Products Other (See Comments)   severe cramping   Citrus Other (See Comments)   Causes Interstitial cystitis flares   Penicillins Hives, Rash   Denies airway involvement Has patient had a PCN reaction causing immediate rash, facial/tongue/throat swelling, SOB or lightheadedness with hypotension: yes hives.  Has patient had a PCN reaction causing severe rash involving mucus membranes or skin necrosis:no Has patient had a PCN reaction that required hospitalization No Has patient had a PCN reaction occurring within the last 10 years: No If all of the above answers are "NO", then may proceed with Cephalosporin use.   Promethazine Hcl Other (See Comments)   Muscle twitching and contracture    Adhesive [tape]    Red splotches   Beta Vulgaris    Promethazine    Doxycycline Nausea And Vomiting   Morphine Sulfate Nausea And Vomiting        Medication List        Accurate as of May 27, 2021 10:37 AM. If you have any questions, ask your nurse or doctor.          ALPRAZolam 0.5 MG tablet Commonly known as: XANAX TAKE  1 TABLET BY MOUTH 3 TIMES DAILY AS NEEDED FOR ANXIETY   atenolol 25 MG tablet Commonly known as: TENORMIN Take by mouth daily.   B-D 3CC LUER-LOK SYR 25GX1" 25G X 1" 3 ML  Misc Generic drug: SYRINGE-NEEDLE (DISP) 3 ML USE WITH CYANOCOBALAMIN EVERY 14 DAYS   B-D 3CC LUER-LOK SYR 25GX1" 25G X 1" 3 ML Misc Generic drug: SYRINGE-NEEDLE (DISP) 3 ML USE WITH CYANOCOBALAMIN EVERY 14 DAYS   cholecalciferol 1000 units tablet Commonly known as: VITAMIN D Take 1,000 Units by mouth every evening.   clobetasol 0.05 % external solution Commonly known as: TEMOVATE Apply 1 application topically 2 (two) times daily.   cyanocobalamin 1000 MCG/ML injection Commonly known as: (VITAMIN B-12) INJECT 1 ML INTO THE MUSCLE EVERY 14 DAYS   cyclobenzaprine 10 MG tablet Commonly known as: FLEXERIL Take 1/2 to 1 tablet by mouth every 8 to 12 hours as needed for acute muscle spasms   fluorometholone 0.1 % ophthalmic suspension Commonly known as: FML 1 drop 3 (three) times daily.   FLUoxetine 40 MG capsule Commonly known as: PROZAC TAKE 1 CAPSULE BY MOUTH ONCE DAILY AFTER BREAKFAST   fluticasone 50 MCG/ACT nasal spray Commonly known as: FLONASE Place 2 sprays into the nose daily.   HYDROcodone-acetaminophen 5-325 MG tablet Commonly known as: NORCO/VICODIN Take 1 tablet by by mouth every 6 hours as needed for pain   hydrocortisone 2.5 % rectal cream Commonly known as: ANUSOL-HC Place 1 application rectally 2 (two) times daily.   hydrOXYzine 10 MG tablet Commonly known as: ATARAX/VISTARIL Take 1/2-1 tablet (5-10 mg total) by mouth 3 (three) times daily as needed.   lamoTRIgine 200 MG tablet Commonly known as: LAMICTAL Take 1 tablet (200 mg total) by mouth at bedtime.   METRONIDAZOLE (TOPICAL) 0.75 % Lotn USE EXTERNALLY ONCE DAILY AS DIRECTED   omeprazole 40 MG capsule Commonly known as: PRILOSEC Take 1 capsule (40 mg total) by mouth daily before breakfast.   Pazeo 0.7 % Soln Generic drug: Olopatadine HCl   Olopatadine HCl 0.2 % Soln   predniSONE 10 MG tablet Commonly known as: DELTASONE Take 1 tablet three times a day for 2 days, then 1 tablet twice a  day for 5 days, then 1 tablet daily till finished   QUEtiapine 50 MG tablet Commonly known as: SEROquel Take 1-2 tablets at bedtime for sleep.   sucralfate 1 GM/10ML suspension Commonly known as: Carafate TAKE 10 MLS BY MOUTH 3 TIMES DAILY AS NEEDED FOR DIGESTION        All past medical history, surgical history, allergies, family history, immunizations andmedications were updated in the EMR today and reviewed under the history and medication portions of their EMR.      ROS: 14 pt review of systems performed and negative (unless mentioned in an HPI)  Objective: BP 106/62   Pulse 61   Temp 98.3 F (36.8 C) (Oral)   Ht 5\' 2"  (1.575 m)   Wt 116 lb (52.6 kg)   SpO2 97%   BMI 21.22 kg/m  Gen: Afebrile. No acute distress. Nontoxic in appearance, well-developed, well-nourished,  pleasant female.  HENT: AT. [place]. Bilateral TM visualized and normal in appearance, normal external auditory canal. MMM, no oral lesions, adequate dentition. Bilateral nares within normal limits. Throat without erythema, ulcerations or exudates. no Cough on exam, no hoarseness on exam. Eyes:Pupils Equal Round Reactive to light, Extraocular movements intact,  Conjunctiva without redness, discharge or icterus. Neck/lymp/endocrine: Supple,no lymphadenopathy, no thyromegaly CV: RRR no murmur, no  edema, +2/4 P posterior tibialis pulses.  Chest: CTAB, no wheeze, rhonchi or crackles. normal Respiratory effort. good Air movement. Abd: Soft. flat. NTND. BS present. no Masses palpated. No hepatosplenomegaly. No rebound tenderness or guarding. Skin: no rashes, purpura or petechiae. Warm and well-perfused. Skin intact. Neuro/Msk: Normal gait. PERLA. EOMi. Alert. Oriented x3.  Cranial nerves II through XII intact. Muscle strength 5/5 upper/lower extremity. DTRs equal bilaterally. Psych: Normal affect, dress and demeanor. Normal speech. Normal thought content and judgment.  No results found.  Assessment/plan: MECCA GUITRON is a 65 y.o. female present for CPE B12 deficiency Compliant with supplementation - Vitamin B12  Heart palpitations Continue atenolol 12.5 mg daily as needed - TSH - Basic Metabolic Panel (BMET)  Encounter for long-term current use of medication CMP collected today  GERD: Discussed flushing/hot flash may be suggestive of reflux. Encouraged her to continue her omeprazole 40 mg daily instead of as needed for the next couple weeks. Refilled medication for her today.  Flushing/Hot flashes Thyroid function test collected today.  Was normal in June. - T3, free Discussed considering cortisol, iron levels, pheo and carcinoid work-up if labs do not indicate cause today.   We also discussed GERD can cause similar symptoms.  Encouraged her to start her PPI daily instead of as needed for the next 2 to 4 weeks to see if helpful.  Routine general medical examination at a health care facility Colonoscopy: completed 2014, 10 yr follow up [name]. Mammogram: scheduled 07/12/2020> [facility] Cervical cancer screening: last pap: 2017- Dr. Nori Riis -gyn Immunizations: tdap UTD 2015, Influenza UTD 10/22 (encouraged yearly),shingrix completed, covid series completed Infectious disease screening: Hep C completed DEXA: last completed 2020 at gyn Patient was encouraged to exercise greater than 150 minutes a week. Patient was encouraged to choose a diet filled with fresh fruits and vegetables, and lean meats. AVS provided to patient today for education/recommendation on gender specific health and safety maintenance.  Return in about 24 weeks (around 11/11/2021) for CMC (30 min) and 69yr1d cpe.  No orders of the defined types were placed in this encounter.   No orders of the defined types were placed in this encounter.  No orders of the defined types were placed in this encounter.  Referral Orders  No referral(s) requested today     Electronically signed by: Howard Pouch, [place]

## 2021-05-28 ENCOUNTER — Telehealth: Payer: Self-pay | Admitting: Family Medicine

## 2021-05-28 DIAGNOSIS — R232 Flushing: Secondary | ICD-10-CM

## 2021-05-28 DIAGNOSIS — L74511 Primary focal hyperhidrosis, face: Secondary | ICD-10-CM

## 2021-05-29 ENCOUNTER — Ambulatory Visit
Admission: RE | Admit: 2021-05-29 | Discharge: 2021-05-29 | Disposition: A | Discharge status: Home / Self Care | Payer: 59 | Source: Ambulatory Visit | Attending: Neurosurgery | Admitting: Neurosurgery

## 2021-05-29 ENCOUNTER — Other Ambulatory Visit: Payer: Self-pay

## 2021-05-29 DIAGNOSIS — M4310 Spondylolisthesis, site unspecified: Secondary | ICD-10-CM

## 2021-05-29 DIAGNOSIS — M48061 Spinal stenosis, lumbar region without neurogenic claudication: Secondary | ICD-10-CM | POA: Diagnosis not present

## 2021-05-29 DIAGNOSIS — M47816 Spondylosis without myelopathy or radiculopathy, lumbar region: Secondary | ICD-10-CM | POA: Diagnosis not present

## 2021-05-29 NOTE — Telephone Encounter (Signed)
Please inform patient Her kidney function is stable. Her B12 is good at 497 Thyroid function is normal   I have ordered additional tests to be completed-after December 1. -one is a blood collection, and the others are 24-hour urine collection.  Please educate patient on how to collect 24-hour urine, have her come pick up the containers to do so.  When she drops off 24-hour urine after December 1, she will have her blood collected at that time. -Please make sure she makes a lab appointment and then also she needs to have a provider appointment approximately 3-5 days after her lab appointment.  Both appointments must be made together.   Thanks

## 2021-05-29 NOTE — Telephone Encounter (Signed)
LVM for pt to CB regarding results.  

## 2021-05-30 ENCOUNTER — Ambulatory Visit: Payer: 59 | Admitting: Psychiatry

## 2021-05-30 NOTE — Telephone Encounter (Signed)
Spoke with pt regarding labs and instructions.   

## 2021-06-10 ENCOUNTER — Other Ambulatory Visit (HOSPITAL_COMMUNITY): Payer: Self-pay

## 2021-06-12 DIAGNOSIS — M4310 Spondylolisthesis, site unspecified: Secondary | ICD-10-CM | POA: Diagnosis not present

## 2021-06-14 ENCOUNTER — Ambulatory Visit: Payer: 59

## 2021-06-17 ENCOUNTER — Encounter: Payer: Self-pay | Admitting: Family Medicine

## 2021-06-17 ENCOUNTER — Ambulatory Visit (INDEPENDENT_AMBULATORY_CARE_PROVIDER_SITE_OTHER): Payer: 59

## 2021-06-17 ENCOUNTER — Ambulatory Visit: Payer: 59 | Admitting: Family Medicine

## 2021-06-17 ENCOUNTER — Other Ambulatory Visit: Payer: Self-pay

## 2021-06-17 DIAGNOSIS — L74511 Primary focal hyperhidrosis, face: Secondary | ICD-10-CM | POA: Diagnosis not present

## 2021-06-17 DIAGNOSIS — R232 Flushing: Secondary | ICD-10-CM

## 2021-06-18 NOTE — Telephone Encounter (Signed)
Please advise 

## 2021-06-20 DIAGNOSIS — M1811 Unilateral primary osteoarthritis of first carpometacarpal joint, right hand: Secondary | ICD-10-CM | POA: Diagnosis not present

## 2021-06-24 ENCOUNTER — Ambulatory Visit: Payer: 59 | Admitting: Family Medicine

## 2021-06-24 ENCOUNTER — Ambulatory Visit (INDEPENDENT_AMBULATORY_CARE_PROVIDER_SITE_OTHER): Payer: 59 | Admitting: Psychiatry

## 2021-06-24 ENCOUNTER — Other Ambulatory Visit: Payer: Self-pay

## 2021-06-24 DIAGNOSIS — F411 Generalized anxiety disorder: Secondary | ICD-10-CM | POA: Diagnosis not present
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
## 2021-06-25 ENCOUNTER — Encounter: Payer: Self-pay | Admitting: Family Medicine

## 2021-06-26 ENCOUNTER — Ambulatory Visit: Payer: 59 | Admitting: Adult Health

## 2021-06-26 NOTE — Telephone Encounter (Signed)
Please advise 

## 2021-06-27 LAB — CATECHOLAMINES, FRACTIONATED, URINE, 24 HOUR
Creatinine, Urine mg/day-CATEUR: 1.03 g/(24.h) (ref 0.50–2.15)
Total Volume: 1600 mL

## 2021-06-27 LAB — 5 HIAA, QUANTITATIVE, URINE, 24 HOUR
5 HIAA, 24 Hour Urine: 3.8 mg/24 h (ref <=6.0)
Total Volume: 1600 mL

## 2021-06-27 LAB — METANEPHRINES, URINE, 24 HOUR
METANEPHRINE: 116 mcg/24 h (ref 90–315)
METANEPHRINES, TOTAL: 693 mcg/24 h (ref 224–832)
NORMETANEPHRINE: 577 mcg/24 h (ref 122–676)
Total Volume: 1600 mL

## 2021-06-27 LAB — CALCITONIN: Calcitonin: <2 pg/mL (ref <=5)

## 2021-07-04 ENCOUNTER — Ambulatory Visit: Payer: 59 | Admitting: Family Medicine

## 2021-07-04 ENCOUNTER — Encounter: Payer: Self-pay | Admitting: Family Medicine

## 2021-07-04 ENCOUNTER — Other Ambulatory Visit: Payer: Self-pay

## 2021-07-04 VITALS — BP 109/70 | HR 60 | Temp 98.3°F | Ht 62.0 in | Wt 117.0 lb

## 2021-07-04 DIAGNOSIS — L74511 Primary focal hyperhidrosis, face: Secondary | ICD-10-CM

## 2021-07-04 DIAGNOSIS — R232 Flushing: Secondary | ICD-10-CM

## 2021-07-04 LAB — CORTISOL: Cortisol, Plasma: 12.4 ug/dL

## 2021-07-04 NOTE — Progress Notes (Signed)
This visit occurred during the SARS-CoV-2 public health emergency.  Safety protocols were in place, including screening questions prior to the visit, additional usage of staff PPE, and extensive cleaning of exam room while observing appropriate contact time as indicated for disinfecting solutions.    Patient ID: [name], female  DOB: 06/08/1956, 65 y.o.   MRN: [id number] Patient Care Team    Relationship Specialty Notifications Start End  Ma Hillock, DO PCP - General Family Medicine  12/29/18   Gavin Pound, MD Consulting Physician Rheumatology  04/09/17   Maisie Fus, MD Consulting Physician Obstetrics and Gynecology  04/09/17   Devra Dopp, MD Referring Physician Dermatology  04/09/17   Group, [facility]  12/29/18    Comment: [facility]    12/29/18   [name], MD Consulting Physician Gastroenterology  12/29/18   Earnie Larsson, MD Consulting Physician Neurosurgery  12/29/18     Chief Complaint  Patient presents with   Results    F/u    Subjective: [name] is a 65 y.o.  Female  present for [facility] Hot flashes: Patient reports her hot flashes has continued.  They occur at all times of the day, not associated with meals or ambient temperature.  She has been postmenopausal for many years.  She presents today to discuss lab work in which her 24-hour urine for metanephrines and 5-HIAA were normal.  Lab was unable to run the catecholamines.  CMP was stable, B12 was adequate, thyroid panel normal.  CBC normal 2 months prior. Prior note: Patient reports new symptoms of worsening hot flashes.  She states these hot flashes seem to only occur during the day.  Are not associated with anything in particular including meals or activity.  She reports it will just come on quickly and last a few minutes causing her to sweat.  She reports she is unsure if her face flushes or if it is just a feeling of  flushing.  She reports others do noticed that she has had a change in temperature.  She denies any shortness of breath, dizziness, palpitations, chest discomfort, bladder or bowel changes during this time.  She feels the symptoms have been progressively stronger over the last 8 months.  She also does not take a baby aspirin or feel this is a associated in times when she uses her atenolol.  Depression screen [facility] 2/9 05/27/2021 03/29/2021 05/23/2020 04/27/2019 12/29/2018  Decreased Interest 0 0 0 0 2  Down, Depressed, Hopeless 0 0 0 0 3  PHQ - 2 Score 0 0 0 0 5  Altered sleeping - 0 - - 2  Tired, decreased energy - 0 - - 1  Change in appetite - 0 - - 3  Feeling bad or failure about yourself  - 0 - - 1  Trouble concentrating - 0 - - 2  Moving slowly or fidgety/restless - 0 - - 2  Suicidal thoughts - 0 - - 1  PHQ-9 Score - 0 - - 17  Difficult doing work/chores - Not difficult at all - - Extremely dIfficult  Some recent data might be hidden   GAD 7 : Generalized Anxiety Score 04/27/2019 12/29/2018  Nervous, Anxious, on Edge 1 3  Control/stop worrying 2 3  Worry too much - different things 2 3  Trouble relaxing 1 2  Restless 1 2  Easily annoyed or irritable 2 3  Afraid - awful might happen 2  2  Total GAD 7 Score 11 18  Anxiety Difficulty Somewhat difficult Extremely difficult     Immunization History  Administered Date(s) Administered   Influenza,inj,Quad PF,6+ Mos 04/12/2014   Influenza-Unspecified 05/15/2015, 03/28/2018, 04/20/2020, 04/13/2021   PFIZER(Purple Top)SARS-COV-2 Vaccination 08/04/2019, 08/23/2019   Td 07/15/2003   Tdap 01/11/2021   Zoster Recombinat (Shingrix) 04/27/2019, 09/26/2019   Past Medical History:  Diagnosis Date   Allergy    Anemia    after birth of child 4yrs ago   Anxiety and depression    with insomnia   Blood transfusion without reported diagnosis    x3   Chronic back pain    ? RA, ? Lupus - reports saw rheumatologist in the past but better now,  sees Dr. Trenton Gammon   Eczema    GERD (gastroesophageal reflux disease)    takes Omeprazole daily, with nausea   Heart murmur    Hemorrhoids    History of kidney stones    passed on her own   History of migraine    last one a yr ago and takes Zomig prn   Hot flashes 11/10/2014   HTN (hypertension) 03/31/2012   Hx of echocardiogram    Echo (8/15):  EF 55-60%, no RWMA   IBS (irritable bowel syndrome)    constipation predominant   Interstitial cystitis    hx of UTI   Numbness    both legs and related to back   Osteoporosis    osteopenia   Palpitations    on metoprolol in past but made BP too low   Pneumonia    hx of;last time 13yrs ago   PONV (postoperative nausea and vomiting)    laryngospasm-1999   Poor vision 10/15/2015   -? Ocular rosacea -sees opthomology    Raynaud's disease /phenomenon 10/21/2012   no meds, worse in the winter or cold environment.    Rheumatoid aortitis    uses Diclofenac gel;Rheumatoid   Seasonal allergies    Flonase daily    Urinary incontinence    Allergies  Allergen Reactions   Aspartame And Phenylalanine Other (See Comments)    Migraines   Beef-Derived Products Other (See Comments)    severe cramping   Citrus Other (See Comments)    Causes Interstitial cystitis flares   Penicillins Hives and Rash    Denies airway involvement Has patient had a PCN reaction causing immediate rash, facial/tongue/throat swelling, SOB or lightheadedness with hypotension: yes hives.  Has patient had a PCN reaction causing severe rash involving mucus membranes or skin necrosis:no Has patient had a PCN reaction that required hospitalization No Has patient had a PCN reaction occurring within the last 10 years: No If all of the above answers are "NO", then may proceed with Cephalosporin use.    Promethazine Hcl Other (See Comments)    Muscle twitching and contracture    Adhesive [Tape]     Red splotches   Beta Vulgaris    Promethazine    Doxycycline Nausea And  Vomiting   Morphine Sulfate Nausea And Vomiting   Past Surgical History:  Procedure Laterality Date   ABDOMINAL HYSTERECTOMY  1997   BREAST BIOPSY  1999   benign   COLONOSCOPY  02/11/2013 and 12/24/04   internal and external hemorrhoids   ENDOMETRIAL ABLATION     EXCISION MORTON'S NEUROMA Right    FLEXIBLE SIGMOIDOSCOPY  03/07/2008   internal and external hemorrhoids   POSTERIOR LUMBAR FUSION  05/02/2013   L4-L5 fusion (cage/screws/bone graft) Dr. Annette Stable  TONSILLECTOMY     UPPER GASTROINTESTINAL ENDOSCOPY  10/23/2010   gastritis, irregular Z-line   wisdom teeth extracted     Family History  Problem Relation Age of Onset   Coronary artery disease Father    Heart attack Father    Hypertension Father    Depression Father    Hyperlipidemia Father    Alcohol abuse Father    Early death Father    Kidney disease Father    Mental illness Father    Diverticulosis Mother    Hypertension Mother    Osteoarthritis Mother    Depression Mother    Hyperlipidemia Mother    Kidney disease Mother    Alcohol abuse Sister    Depression Sister    Diabetes Paternal Uncle    Hypertension Maternal Grandmother    Stroke Maternal Grandmother    Hypertension Maternal Grandfather    Pancreatic cancer Maternal Grandfather    Early death Maternal Grandfather    Stroke Paternal Grandfather    Hyperlipidemia Paternal Grandfather    Alcohol abuse Paternal Grandfather    Early death Paternal Grandfather    Hypertension Paternal Grandfather    Depression Paternal Grandmother    Dementia Paternal Grandmother    Arthritis Paternal Grandmother    Mental illness Paternal Grandmother    Alcohol abuse Daughter    Depression Daughter    Drug abuse Daughter    Alcohol abuse Son    Depression Son    Colon cancer Neg Hx    Social History   Social History Narrative   Spiritual Beliefs: methodist   Marital status/children/pets: In second marriage.  First husband committed suicide.  2 children.    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   Safety:      -Wears a bicycle helmet riding a bike: Yes     -smoke alarm in the home:No     - wears seatbelt: Yes     - Feels safe in their relationships: Yes             Allergies as of 07/04/2021       Reactions   Aspartame And Phenylalanine Other (See Comments)   Migraines   Beef-derived Products Other (See Comments)   severe cramping   Citrus Other (See Comments)   Causes Interstitial cystitis flares   Penicillins Hives, Rash   Denies airway involvement Has patient had a PCN reaction causing immediate rash, facial/tongue/throat swelling, SOB or lightheadedness with hypotension: yes hives.  Has patient had a PCN reaction causing severe rash involving mucus membranes or skin necrosis:no Has patient had a PCN reaction that required hospitalization No Has patient had a PCN reaction occurring within the last 10 years: No If all of the above answers are "NO", then may proceed with Cephalosporin use.   Promethazine Hcl Other (See Comments)   Muscle twitching and contracture    Adhesive [tape]    Red splotches   Beta Vulgaris    Promethazine    Doxycycline Nausea And Vomiting   Morphine Sulfate Nausea And Vomiting        Medication List        Accurate as of July 04, 2021  1:02 PM. If you have any questions, ask your nurse or doctor.          ALPRAZolam 0.5 MG tablet Commonly known as: XANAX TAKE 1 TABLET BY MOUTH 3 TIMES DAILY AS NEEDED FOR ANXIETY   atenolol 25 MG tablet Commonly known as: TENORMIN Take 1/2 tablet (12.5 mg  total) by mouth daily as needed.   B-D 3CC LUER-LOK SYR 25GX1" 25G X 1" 3 ML Misc Generic drug: SYRINGE-NEEDLE (DISP) 3 ML USE WITH CYANOCOBALAMIN EVERY 14 DAYS   B-D 3CC LUER-LOK SYR 25GX1" 25G X 1" 3 ML Misc Generic drug: SYRINGE-NEEDLE (DISP) 3 ML USE WITH CYANOCOBALAMIN EVERY 14 DAYS   cholecalciferol 1000 units tablet Commonly known as: VITAMIN D Take 1,000 Units by mouth  every evening.   clobetasol 0.05 % external solution Commonly known as: TEMOVATE APPLY TOPICALLY ONCE DAILY AS NEEDED FOR DERMATITIS   cyanocobalamin 1000 MCG/ML injection Commonly known as: (VITAMIN B-12) INJECT 1 ML INTO THE MUSCLE EVERY 14 DAYS   cyclobenzaprine 10 MG tablet Commonly known as: FLEXERIL Take 1/2 to 1 tablet by mouth every 8 to 12 hours as needed for acute muscle spasms   fluorometholone 0.1 % ophthalmic suspension Commonly known as: FML 1 drop 3 (three) times daily.   FLUoxetine 40 MG capsule Commonly known as: PROZAC TAKE 1 CAPSULE BY MOUTH ONCE DAILY AFTER BREAKFAST   fluticasone 50 MCG/ACT nasal spray Commonly known as: FLONASE Place 2 sprays into the nose daily.   hydrocortisone 2.5 % rectal cream Commonly known as: ANUSOL-HC Place 1 application rectally 2 (two) times daily.   lamoTRIgine 200 MG tablet Commonly known as: LAMICTAL Take 1 tablet (200 mg total) by mouth at bedtime.   METRONIDAZOLE (TOPICAL) 0.75 % Lotn USE EXTERNALLY ONCE DAILY AS DIRECTED   omeprazole 40 MG capsule Commonly known as: PRILOSEC Take 1 capsule (40 mg total) by mouth daily before breakfast. What changed:  when to take this reasons to take this   Pazeo 0.7 % Soln Generic drug: Olopatadine HCl   QUEtiapine 50 MG tablet Commonly known as: SEROquel Take 1-2 tablets at bedtime for sleep.   sucralfate 1 GM/10ML suspension Commonly known as: Carafate TAKE 10 MLS BY MOUTH 3 TIMES DAILY AS NEEDED FOR DIGESTION        All past medical history, surgical history, allergies, family history, immunizations andmedications were updated in the EMR today and reviewed under the history and medication portions of their EMR.      ROS: 14 pt review of systems performed and negative (unless mentioned in an HPI)  Objective: BP 109/70    Pulse 60    Temp 98.3 F (36.8 C) (Oral)    Ht 5\' 2"  (1.575 m)    Wt 117 lb (53.1 kg)    SpO2 98%    BMI 21.40 kg/m  Physical Exam Vitals  and nursing note reviewed.  Constitutional:      General: She is not in acute distress.    Appearance: Normal appearance. She is not ill-appearing, toxic-appearing or diaphoretic.  HENT:     Head: Normocephalic and atraumatic.  Eyes:     General: No scleral icterus.       Right eye: No discharge.        Left eye: No discharge.     Extraocular Movements: Extraocular movements intact.     Conjunctiva/sclera: Conjunctivae normal.     Pupils: Pupils are equal, round, and reactive to light.  Cardiovascular:     Rate and Rhythm: Normal rate and regular rhythm.  Pulmonary:     Effort: Pulmonary effort is normal. No respiratory distress.     Breath sounds: Normal breath sounds. No wheezing, rhonchi or rales.  Musculoskeletal:     Cervical back: No tenderness.  Lymphadenopathy:     Cervical: No cervical adenopathy.  Skin:  General: Skin is warm and dry.     Coloration: Skin is not jaundiced or pale.     Findings: No erythema or rash.  Neurological:     Mental Status: She is alert and oriented to person, place, and time. Mental status is at baseline.     Motor: No weakness.     Gait: Gait normal.  Psychiatric:        Mood and Affect: Mood normal.        Behavior: Behavior normal.        Thought Content: Thought content normal.        Judgment: Judgment normal.     No results found.  Assessment/plan: [name] is a 65 y.o. female present for  Flushing/Hot flashes Lab work-up thus far has not indicated because of her hot flashes and flushing. We discussed other possible indications and will collect an iron and cortisol today.  If these labs are also normal we can consider referring her to endocrine for their thoughts. We had discussed last visit that uncontrolled reflux can also cause similar symptoms.  She does have reflux, but feels it is controlled on medication. Cortisol, iron and PTH collected today No follow-ups on file.    Orders Placed This Encounter   Procedures   Cortisol   Iron, TIBC and Ferritin Panel   Cortisol   PTH, Intact and Calcium    No orders of the defined types were placed in this encounter.   Referral Orders  No referral(s) requested today     Electronically signed by: Howard Pouch, [place]

## 2021-07-04 NOTE — Patient Instructions (Signed)
°  We will collect last labs today and if these are also normal, then we can consider endocrine referral.

## 2021-07-11 LAB — PTH, INTACT AND CALCIUM: Calcium: 9.8 mg/dL (ref 8.6–10.4)

## 2021-07-11 LAB — TEST AUTHORIZATION

## 2021-07-11 LAB — IRON,TIBC AND FERRITIN PANEL
%SAT: 25 % (calc) (ref 16–45)
Ferritin: 53 ng/mL (ref 16–288)
Iron: 102 ug/dL (ref 45–160)
TIBC: 403 mcg/dL (calc) (ref 250–450)

## 2021-07-11 LAB — EXTRA SPECIMEN

## 2021-07-12 ENCOUNTER — Telehealth: Payer: Self-pay

## 2021-07-12 NOTE — Telephone Encounter (Signed)
Pt informed of results by Lorette Ang.

## 2021-07-12 NOTE — Telephone Encounter (Signed)
Returning [name]'s call, patient thinks it is in regards to her lab results.  Clinical staff at lunch, patient was told clinical team member would call her back.    Patient can be reached at [phone]

## 2021-07-16 ENCOUNTER — Ambulatory Visit
Admission: RE | Admit: 2021-07-16 | Discharge: 2021-07-16 | Disposition: A | Discharge status: Home / Self Care | Payer: 59 | Source: Ambulatory Visit | Attending: Obstetrics & Gynecology | Admitting: Obstetrics & Gynecology

## 2021-07-16 DIAGNOSIS — Z1231 Encounter for screening mammogram for malignant neoplasm of breast: Secondary | ICD-10-CM

## 2021-07-24 ENCOUNTER — Ambulatory Visit (INDEPENDENT_AMBULATORY_CARE_PROVIDER_SITE_OTHER): Payer: 59 | Admitting: Adult Health

## 2021-07-24 ENCOUNTER — Encounter: Payer: Self-pay | Admitting: Adult Health

## 2021-07-24 ENCOUNTER — Other Ambulatory Visit: Payer: Self-pay

## 2021-07-24 ENCOUNTER — Other Ambulatory Visit (HOSPITAL_COMMUNITY): Payer: Self-pay

## 2021-07-24 DIAGNOSIS — F4310 Post-traumatic stress disorder, unspecified: Secondary | ICD-10-CM | POA: Diagnosis not present

## 2021-07-24 DIAGNOSIS — F410 Panic disorder [episodic paroxysmal anxiety] without agoraphobia: Secondary | ICD-10-CM | POA: Diagnosis not present

## 2021-07-24 DIAGNOSIS — F411 Generalized anxiety disorder: Secondary | ICD-10-CM

## 2021-07-24 DIAGNOSIS — F331 Major depressive disorder, recurrent, moderate: Secondary | ICD-10-CM | POA: Diagnosis not present

## 2021-07-24 DIAGNOSIS — G4700 Insomnia, unspecified: Secondary | ICD-10-CM

## 2021-07-24 MED ORDER — ALPRAZOLAM 0.5 MG PO TABS
ORAL_TABLET | Freq: Three times a day (TID) | ORAL | 1 refills | Status: AC | PRN
  Filled 2021-07-24: qty 270, fill #0

## 2021-07-24 MED ORDER — QUETIAPINE FUMARATE 50 MG PO TABS
ORAL_TABLET | ORAL | 3 refills | Status: DC
Start: 2021-07-24 — End: 2021-10-09
  Filled 2021-07-24: qty 180, 90d supply, fill #0

## 2021-07-24 MED ORDER — LAMOTRIGINE 200 MG PO TABS
200.0000 mg | ORAL_TABLET | Freq: Every day | ORAL | 3 refills | Status: DC
  Filled 2021-07-24 – 2021-10-08 (×2): qty 90, 90d supply, fill #0
  Filled 2022-01-06: qty 90, 90d supply, fill #1
  Filled 2022-04-21: qty 90, 90d supply, fill #2

## 2021-07-24 NOTE — Progress Notes (Signed)
[name] [id number] 1956/06/13 66 y.o.  Subjective:   Patient ID:  [name] is a 66 y.o. (DOB 08-10-1955) female.  Chief Complaint: No chief complaint on file.   HPI [name] presents to the office today for follow-up of MDD, GAD, panic disorder, insomnia and PTSD.  Describes mood today as "better". Pleasant. Mood symptoms - denies depression and irritability. Feels anxious - "depends on the day I have". Denies panic attacks. Stating - "I feel like I'm doing ok". Medications seem to be doing well - stopped the Prozac 4 months ago. She and husband doing well. Stable interest and motivation. Seeing therapist - Rinaldo Cloud. Taking medications as prescribed.  Energy levels stable. Active, has a regular exercise routine - bike riding. Averages 60 miles a week. Enjoys some usual interests and activities. Married. Lives with husband of 16 years. Mother lives in [place] - visited over the holidays. Spending time with family. Appetite adequate. Weight loss - 118 - 120 pounds. Sleeping well most nights. Averages 7 hours. Focus and concentration stable. Completing tasks. Managing aspects of household. Works full time - 3 days a week - Therapist, sports. Denies SI or HI.  Denies AH or VH.  Previous medication trials: Remeron, Trazadone, Restoril, Xanax   GAD-7    Flowsheet Row Office Visit from 04/27/2019 in [facility] Office Visit from 12/29/2018 in [facility]  Total GAD-7 Score 11 [place] Office Visit from 12/22/2016 in [facility]  Total Score (max 30 points ) 30      PHQ2-9    [place] Visit from 05/27/2021 in [facility] Office Visit from 03/29/2021 in [place] Office Visit from 05/23/2020 in [place] Office Visit from 04/27/2019 in [place] Office Visit from  12/29/2018 in [facility]  PHQ-2 Total Score 0 0 0 0 5  PHQ-9 Total Score -- 0 -- -- 17        Review of Systems:  Review of Systems  Musculoskeletal:  Negative for gait problem.  Neurological:  Negative for tremors.  Psychiatric/Behavioral:         Please refer to HPI   Medications: I have reviewed the patient's current medications.  Current Outpatient Medications  Medication Sig Dispense Refill   ALPRAZolam (XANAX) 0.5 MG tablet TAKE 1 TABLET BY MOUTH 3 TIMES DAILY AS NEEDED FOR ANXIETY 270 tablet 1   atenolol (TENORMIN) 25 MG tablet Take 1/2 tablet (12.5 mg total) by mouth daily as needed. 90 tablet 1   B-D 3CC LUER-LOK SYR 25GX1" 25G X 1" 3 ML MISC USE WITH CYANOCOBALAMIN EVERY 14 DAYS     cholecalciferol (VITAMIN D) 1000 UNITS tablet Take 1,000 Units by mouth every evening.     clobetasol (TEMOVATE) 0.05 % external solution APPLY TOPICALLY ONCE DAILY AS NEEDED FOR DERMATITIS 50 mL 2   cyanocobalamin (,VITAMIN B-12,) 1000 MCG/ML injection INJECT 1 ML INTO THE MUSCLE EVERY 14 DAYS 6 mL 0   cyclobenzaprine (FLEXERIL) 10 MG tablet Take 1/2 to 1 tablet by mouth every 8 to 12 hours as needed for acute muscle spasms 30 tablet 2   fluorometholone (FML) 0.1 % ophthalmic suspension 1 drop 3 (three) times daily.     FLUoxetine (PROZAC) 40 MG capsule TAKE 1 CAPSULE BY MOUTH ONCE DAILY  AFTER BREAKFAST 90 capsule 1   fluticasone (FLONASE) 50 MCG/ACT nasal spray Place 2 sprays into the nose daily. 16 g 5   hydrocortisone (ANUSOL-HC) 2.5 % rectal cream Place 1 application rectally 2 (two) times daily. 30 g 1   lamoTRIgine (LAMICTAL) 200 MG tablet Take 1 tablet (200 mg total) by mouth at bedtime. 90 tablet 3   METRONIDAZOLE, TOPICAL, 0.75 % LOTN USE EXTERNALLY ONCE DAILY AS DIRECTED 59 mL 3   omeprazole (PRILOSEC) 40 MG capsule Take 1 capsule (40 mg total) by mouth daily before breakfast. (Patient taking differently: Take 40 mg by mouth as needed.) 90 capsule 1   PAZEO 0.7 %  SOLN   98   QUEtiapine (SEROQUEL) 50 MG tablet Take 1-2 tablets at bedtime for sleep. 180 tablet 1   sucralfate (CARAFATE) 1 GM/10ML suspension TAKE 10 MLS BY MOUTH 3 TIMES DAILY AS NEEDED FOR DIGESTION 840 mL 2   SYRINGE-NEEDLE, DISP, 3 ML (B-D 3CC LUER-LOK SYR 25GX1") 25G X 1" 3 ML MISC USE WITH CYANOCOBALAMIN EVERY 14 DAYS 6 each 0   No current facility-administered medications for this visit.    Medication Side Effects: None  Allergies:  Allergies  Allergen Reactions   Aspartame And Phenylalanine Other (See Comments)    Migraines   Beef-Derived Products Other (See Comments)    severe cramping   Citrus Other (See Comments)    Causes Interstitial cystitis flares   Penicillins Hives and Rash    Denies airway involvement Has patient had a PCN reaction causing immediate rash, facial/tongue/throat swelling, SOB or lightheadedness with hypotension: yes hives.  Has patient had a PCN reaction causing severe rash involving mucus membranes or skin necrosis:no Has patient had a PCN reaction that required hospitalization No Has patient had a PCN reaction occurring within the last 10 years: No If all of the above answers are "NO", then may proceed with Cephalosporin use.    Promethazine Hcl Other (See Comments)    Muscle twitching and contracture    Adhesive [Tape]     Red splotches   Beta Vulgaris    Promethazine    Doxycycline Nausea And Vomiting   Morphine Sulfate Nausea And Vomiting    Past Medical History:  Diagnosis Date   Allergy    Anemia    after birth of child 47yrs ago   Anxiety and depression    with insomnia   Blood transfusion without reported diagnosis    x3   Chronic back pain    ? RA, ? Lupus - reports saw rheumatologist in the past but better now, sees Dr. Trenton Gammon   Eczema    GERD (gastroesophageal reflux disease)    takes Omeprazole daily, with nausea   Heart murmur    Hemorrhoids    History of kidney stones    passed on her own   History of migraine     last one a yr ago and takes Zomig prn   Hot flashes 11/10/2014   HTN (hypertension) 03/31/2012   Hx of echocardiogram    Echo (8/15):  EF 55-60%, no RWMA   IBS (irritable bowel syndrome)    constipation predominant   Interstitial cystitis    hx of UTI   Numbness    both legs and related to back   Osteoporosis    osteopenia   Palpitations    on metoprolol in past but made BP too low   Pneumonia    hx of;last time 55yrs ago   PONV (postoperative nausea  and vomiting)    laryngospasm-1999   Poor vision 10/15/2015   -? Ocular rosacea -sees opthomology    Raynaud's disease /phenomenon 10/21/2012   no meds, worse in the winter or cold environment.    Rheumatoid aortitis    uses Diclofenac gel;Rheumatoid   Seasonal allergies    Flonase daily    Urinary incontinence     Past Medical History, Surgical history, Social history, and Family history were reviewed and updated as appropriate.   Please see review of systems for further details on the patient's review from today.   Objective:   Physical Exam:  There were no vitals taken for this visit.  Physical Exam  Lab Review:     Component Value Date/Time   NA 141 05/27/2021 1112   NA 143 12/22/2016 0948   K 5.0 05/27/2021 1112   CL 102 05/27/2021 1112   CO2 30 05/27/2021 1112   GLUCOSE 80 05/27/2021 1112   GLUCOSE 95 04/21/2006 1635   BUN 25 (H) 05/27/2021 1112   BUN 17 12/22/2016 0948   CREATININE 1.04 05/27/2021 1112   CALCIUM 9.8 07/04/2021 1150   PROT 6.9 03/29/2021 1243   PROT 7.0 12/22/2016 0948   ALBUMIN 4.6 03/29/2021 1243   ALBUMIN 4.7 12/22/2016 0948   AST 20 03/29/2021 1243   ALT 13 03/29/2021 1243   ALKPHOS 81 03/29/2021 1243   BILITOT 0.5 03/29/2021 1243   BILITOT 0.3 12/22/2016 0948   GFRNONAA 65 12/22/2016 0948   GFRAA 75 12/22/2016 0948       Component Value Date/Time   WBC 4.7 03/29/2021 1243   RBC 4.63 03/29/2021 1243   HGB 14.0 03/29/2021 1243   HGB 14.1 12/22/2016 0948   HCT 43.2 03/29/2021  1243   HCT 44.0 12/22/2016 0948   PLT 186.0 03/29/2021 1243   PLT 206 12/22/2016 0948   MCV 93.3 03/29/2021 1243   MCV 94 12/22/2016 0948   MCH 30.3 12/22/2016 0948   MCH 30.7 11/01/2015 1154   MCHC 32.4 03/29/2021 1243   RDW 13.7 03/29/2021 1243   RDW 14.3 12/22/2016 0948   LYMPHSABS 1.2 03/29/2021 1243   LYMPHSABS 0.9 12/22/2016 0948   MONOABS 0.5 03/29/2021 1243   EOSABS 0.2 03/29/2021 1243   EOSABS 0.1 12/22/2016 0948   BASOSABS 0.0 03/29/2021 1243   BASOSABS 0.0 12/22/2016 0948    No results found for: POCLITH, LITHIUM   No results found for: PHENYTOIN, PHENOBARB, VALPROATE, CBMZ   .res Assessment: Plan:    Plan:  PDMP reviewed  1. Xanax 0.5mg  TID - using some at bedtime 2. Lamictal 200mg  daily 3. Prozac 40mg  daily - stopped x 4 months 4. Seroquel 50mg  - 1 to 2 tablets.  RTC 3 months  Patient advised to contact office with any questions, adverse effects, or acute worsening in signs and symptoms.  Discussed potential benefits, risk, and side effects of benzodiazepines to include potential risk of tolerance and dependence, as well as possible drowsiness.  Advised patient not to drive if experiencing drowsiness and to take lowest possible effective dose to minimize risk of dependence and tolerance. Diagnoses and all orders for this visit:  Generalized anxiety disorder  Insomnia, unspecified type  Panic disorder  Moderate recurrent major depression ([place])     Please see After Visit Summary for patient specific instructions.  Future Appointments  Date Time Provider [place]  08/12/2021 11:00 AM Shanon Ace, LCSW [facility] None  09/10/2021 11:00 AM Shanon Ace, LCSW [facility] None    No orders  of the defined types were placed in this encounter.   -------------------------------

## 2021-07-31 ENCOUNTER — Other Ambulatory Visit: Payer: Self-pay

## 2021-07-31 ENCOUNTER — Other Ambulatory Visit: Payer: Self-pay | Admitting: Family Medicine

## 2021-07-31 ENCOUNTER — Ambulatory Visit (INDEPENDENT_AMBULATORY_CARE_PROVIDER_SITE_OTHER): Payer: 59 | Admitting: Psychiatry

## 2021-07-31 ENCOUNTER — Other Ambulatory Visit (HOSPITAL_COMMUNITY): Payer: Self-pay

## 2021-07-31 DIAGNOSIS — F411 Generalized anxiety disorder: Secondary | ICD-10-CM | POA: Diagnosis not present

## 2021-07-31 NOTE — Progress Notes (Signed)
[facility] Counselor/Therapist Progress Note  Patient ID: [name], MRN: [id number],    Date: 07/31/2021  Time Spent: 57 minutes   Treatment Type: Individual Therapy  Reported Symptoms: anxiety, depression, anger, frustration  Mental Status Exam:  Appearance:   Casual     Behavior:  Appropriate, Sharing, and Motivated  Motor:  Normal  Speech/Language:   Clear and Coherent  Affect:  Depressed and angry, anxious  Mood:  anxious, depressed, and irritable  Thought process:  goal directed  Thought content:    WNL  Sensory/Perceptual disturbances:    WNL  Orientation:  oriented to person, place, time/date, situation, day of week, month of year, year, and stated date of Jan. 18, 2023  Attention:  Good  Concentration:  Good and Fair  Memory:  WNL  Fund of knowledge:   Good  Insight:    Good  Judgment:   Good  Impulse Control:  Good   Risk Assessment: Danger to Self:  No Self-injurious Behavior: No Danger to Others: No Duty to Warn:no Physical Aggression / Violence:No  Access to Firearms a concern: No  Gang Involvement:No   Subjective: Patient in today reporting anxiety, depression, anger, frustration, mostly related to personal concerns. (Not all details included in this note due to patient privacy needs.) Realizing some significant changes she is needing to make and processing how to best plan and follow through on these changes. Work very stressful. Is sleeping with the help of her meds. Denies any SI. Overwhelmed. Trying to support sister as well whose husband has serious health issues. Obsessiveness and overthinking. Focused on some needed changes and how to go about making first steps.  More calm and grounded by session end and seems to know what her next steps need to be in making positive changes for herself.    Interventions: Solution-Oriented/Positive Psychology, Ego-Supportive, and Insight-Oriented  Treatment goal plan:  Patient not signing the tx plan on  computer screen due to COVID Treatment Goals: Goals remain on treatment plan as patient works on strategies to meet her goals.  Progress will be noted each session and documented in "Progress" section of treatment plan. Long term goal: Develop healthy cognitive patterns and beliefs about self and the world that lead to alleviation of depression and anxiety and help prevent relapse.  Progressing: Patient is motivated. Struggling to focus some due to immediate, unexpected health concern with husband.  Short term goal: Learn and implement personal skills for managing stress, solving daily problems, and resolving conflicts effectively. Strategy: Teach patient calming skills, problem solving skills, and conflict resolution skills, to better manage daily stressors.   Diagnosis:   ICD-10-CM   1. Generalized anxiety disorder  F41.1      Plan: Patient today showing good motivation and actively involved in session as she worked on issues of anxiety, anger, frustration, depression, and irritability. Worked with specific examples of obstacles she is facing and seemed to gain greater clarity in what to do for herself and decision-making, which led to more calmness and sense of direction for patient. Encouraged her in her practice of more positive behaviors including: For every negative thought create 2 positives, believe in herself that she can make important changes that can influence her role within the family and at work, focus more on her own self-care, stay in touch with people that are supportive, practice more consistent positive self talk, continue spending time outdoors in activities that she enjoys especially biking, pursuing meaningful conversation with husband, take  breaks as needed to experience more calmness for herself, intentionally looking for more positives each day, searching for the positives within herself and make a list of them, provide helpful feedback to husband regarding the positives  when they occur in their relationship, maintain healthy boundaries with others as needed, reflect on any progress made, and feel good about the strength she shows working with goal-directed behaviors in a direction that supports improved emotional health.  Goal review and progress/challenges noted with patient.  Next appointment within 2 to 3 weeks.  This record has been created using [employer].  Chart creation errors have been sought, but may not always have been located and corrected.  Such creation errors do not reflect on the standard of medical care provided.  Shanon Ace, LCSW

## 2021-08-01 ENCOUNTER — Other Ambulatory Visit (HOSPITAL_COMMUNITY): Payer: Self-pay

## 2021-08-02 ENCOUNTER — Telehealth: Payer: Self-pay | Admitting: Family Medicine

## 2021-08-02 ENCOUNTER — Other Ambulatory Visit (HOSPITAL_COMMUNITY): Payer: Self-pay

## 2021-08-02 NOTE — Telephone Encounter (Signed)
LVM to call to discuss.  Pt will need to have the person that made the changes send in the Rx or she will need an appt scheduled to discuss with PCP

## 2021-08-02 NOTE — Telephone Encounter (Signed)
Pt needing med refill  It was increased frm 1-2 to a whole,[place]  PT cell: [phone]  atenolol atenolol (TENORMIN) 25 MG tablet   [facility] Phone:  [phone]  Fax:  [phone]

## 2021-08-05 ENCOUNTER — Other Ambulatory Visit (HOSPITAL_COMMUNITY): Payer: Self-pay

## 2021-08-05 ENCOUNTER — Telehealth: Payer: Self-pay

## 2021-08-05 MED ORDER — ATENOLOL 25 MG PO TABS
25.0000 mg | ORAL_TABLET | Freq: Every day | ORAL | 1 refills | Status: DC
  Filled 2021-08-05: qty 90, 90d supply, fill #0
  Filled 2021-12-06: qty 90, 90d supply, fill #1

## 2021-08-05 NOTE — Telephone Encounter (Signed)
Change script for her.

## 2021-08-05 NOTE — Telephone Encounter (Signed)
Spoke with pt regarding rx. Pt was seen in 03/2021 at [facility] with [name] who did inc med to 25 mg. Pt states medication is working well. Please advise if okay to change sig to reflect.

## 2021-08-05 NOTE — Telephone Encounter (Signed)
Please see other encounter.

## 2021-08-05 NOTE — Telephone Encounter (Signed)
Hopkins, Dana 8 minutes ago (9:32 AM)   Patient returning call.  Please call patient when available.

## 2021-08-05 NOTE — Telephone Encounter (Signed)
Patient returning call.  Please call patient when available.

## 2021-08-07 ENCOUNTER — Other Ambulatory Visit (HOSPITAL_COMMUNITY): Payer: Self-pay

## 2021-08-08 ENCOUNTER — Other Ambulatory Visit (HOSPITAL_COMMUNITY): Payer: Self-pay

## 2021-08-12 ENCOUNTER — Ambulatory Visit: Payer: 59 | Admitting: Psychiatry

## 2021-08-13 ENCOUNTER — Other Ambulatory Visit (HOSPITAL_COMMUNITY): Payer: Self-pay

## 2021-08-13 ENCOUNTER — Other Ambulatory Visit: Payer: Self-pay | Admitting: Family Medicine

## 2021-08-13 MED ORDER — CYANOCOBALAMIN 1000 MCG/ML IJ SOLN
INTRAMUSCULAR | 0 refills | Status: DC
  Filled 2021-08-13: qty 6, 84d supply, fill #0

## 2021-08-14 ENCOUNTER — Other Ambulatory Visit (HOSPITAL_COMMUNITY): Payer: Self-pay

## 2021-08-20 ENCOUNTER — Other Ambulatory Visit (HOSPITAL_COMMUNITY): Payer: Self-pay

## 2021-08-20 ENCOUNTER — Encounter: Payer: Self-pay | Admitting: Adult Health

## 2021-08-20 ENCOUNTER — Other Ambulatory Visit: Payer: Self-pay

## 2021-08-20 ENCOUNTER — Ambulatory Visit (INDEPENDENT_AMBULATORY_CARE_PROVIDER_SITE_OTHER): Payer: 59 | Admitting: Adult Health

## 2021-08-20 ENCOUNTER — Telehealth: Payer: Self-pay | Admitting: Adult Health

## 2021-08-20 DIAGNOSIS — F411 Generalized anxiety disorder: Secondary | ICD-10-CM

## 2021-08-20 DIAGNOSIS — F410 Panic disorder [episodic paroxysmal anxiety] without agoraphobia: Secondary | ICD-10-CM | POA: Diagnosis not present

## 2021-08-20 DIAGNOSIS — M1811 Unilateral primary osteoarthritis of first carpometacarpal joint, right hand: Secondary | ICD-10-CM | POA: Diagnosis not present

## 2021-08-20 MED ORDER — "BD LUER-LOK SYRINGE 25G X 1"" 3 ML MISC"
0 refills | Status: DC
  Filled 2021-08-20: qty 6, 84d supply, fill #0

## 2021-08-20 MED ORDER — FLUOXETINE HCL 20 MG PO CAPS
20.0000 mg | ORAL_CAPSULE | Freq: Every day | ORAL | 3 refills | Status: DC
  Filled 2021-08-20: qty 90, 90d supply, fill #0
  Filled 2021-12-24: qty 90, 90d supply, fill #1

## 2021-08-20 NOTE — Progress Notes (Signed)
[name] [id number] [birth year] 66 y.o.  Subjective:   Patient ID:  [name] is a 66 y.o. (DOB 05/31/1956) female.  Chief Complaint: No chief complaint on file.   HPI WILLIETTE ALSMAN presents to the office today for follow-up of MDD, GAD, panic disorder, insomnia and PTSD.  Describes mood today as "not good". Pleasant. Tearful - "a lot of the time". Mood symptoms - reports anxiety, depression and irritability. Reporting panic attacks - one at work recently. Reporting increased worry and rumination. Stating - "I don't feel like I'm doing well at all". Having more down days. Feeling overwhelmed - more so here lately. Increased stress in the work setting. Reporting increased hip pain after standing all day. Frustrated with home environment - concerned about husband's health. Unable to identify a precipitant - "it's just everything". Decreased interest and motivation - not really wanting to do the things she likes to do. Seeing therapist - Rockne Menghini. Taking medications as prescribed.  Energy levels stable. Active, has a regular exercise routine - bike riding.  Enjoys some usual interests and activities. Married. Lives with husband of 16 years. Mother lives in [place] - visited over the holidays. Spending time with family. Appetite adequate. Weight loss - 112 from 118 pounds. Sleep has declined. Averages 6 hours. Difficulties shutting mind off at night. Focus and concentration stable. Completing tasks. Managing aspects of household. 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 Does not feel like she can continue working current schedule. Wanting to reduce work schedule to two days a week through the month of March. Denies SI or HI.  Denies AH or VH.  Previous medication trials: Remeron, Trazadone, Restoril, Xanax   GAD-7    Flowsheet Row Office Visit from 04/27/2019 in [facility] Visit from 12/29/2018 in [facility]   Total GAD-7 Score 11 18      Mini-Mental    Flowsheet Row Office Visit from 12/22/2016 in [facility]  Total Score (max 30 points ) 30      PHQ2-9    Flowsheet Row Office Visit from 05/27/2021 in [facility] Visit from 03/29/2021 in [facility] Visit from 05/23/2020 in [facility] Visit from 04/27/2019 in [facility] Visit from 12/29/2018 in [facility]  PHQ-2 Total Score 0 0 0 0 5  PHQ-9 Total Score -- 0 -- -- 17        Review of Systems:  Review of Systems  Musculoskeletal:  Negative for gait problem.  Neurological:  Negative for tremors.  Psychiatric/Behavioral:         Please refer to HPI   Medications: I have reviewed the patient's current medications.  Current Outpatient Medications  Medication Sig Dispense Refill   FLUoxetine (PROZAC) 20 MG capsule Take 1 capsule (20 mg total) by mouth daily. 90 capsule 3   ALPRAZolam (XANAX) 0.5 MG tablet TAKE 1 TABLET BY MOUTH 3 TIMES DAILY AS NEEDED FOR ANXIETY 270 tablet 1   atenolol (TENORMIN) 25 MG tablet Take 1 tablet (25 mg total) by mouth daily. 90 tablet 1   B-D 3CC LUER-LOK SYR 25GX1" 25G X 1" 3 ML MISC USE WITH CYANOCOBALAMIN EVERY 14 DAYS     cholecalciferol (VITAMIN D) 1000 UNITS tablet Take 1,000 Units by mouth every evening.  clobetasol (TEMOVATE) 0.05 % external solution APPLY TOPICALLY ONCE DAILY AS NEEDED FOR DERMATITIS 50 mL 2   cyanocobalamin (,VITAMIN B-12,) 1000 MCG/ML injection INJECT 1 ML INTO THE MUSCLE EVERY 14 DAYS 6 mL 0   cyclobenzaprine (FLEXERIL) 10 MG tablet Take 1/2 to 1 tablet by mouth every 8 to 12 hours as needed for acute muscle spasms 30 tablet 2   fluorometholone (FML) 0.1 % ophthalmic suspension 1 drop 3 (three) times daily.     fluticasone (FLONASE) 50 MCG/ACT nasal spray Place 2 sprays into the nose daily. 16 g 5    hydrocortisone (ANUSOL-HC) 2.5 % rectal cream Place 1 application rectally 2 (two) times daily. 30 g 1   lamoTRIgine (LAMICTAL) 200 MG tablet Take 1 tablet (200 mg total) by mouth at bedtime. 90 tablet 3   omeprazole (PRILOSEC) 40 MG capsule Take 1 capsule (40 mg total) by mouth daily before breakfast. (Patient taking differently: Take 40 mg by mouth as needed.) 90 capsule 1   PAZEO 0.7 % SOLN   98   QUEtiapine (SEROQUEL) 50 MG tablet Take 1-2 tablets by mouth at bedtime for sleep. 180 tablet 3   sucralfate (CARAFATE) 1 GM/10ML suspension TAKE 10 MLS BY MOUTH 3 TIMES DAILY AS NEEDED FOR DIGESTION 840 mL 2   SYRINGE-NEEDLE, DISP, 3 ML (B-D 3CC LUER-LOK SYR 25GX1") 25G X 1" 3 ML MISC USE WITH CYANOCOBALAMIN EVERY 14 DAYS 6 each 0   SYRINGE-NEEDLE, DISP, 3 ML (B-D 3CC LUER-LOK SYR 25GX1") 25G X 1" 3 ML MISC Use as directed to inject b-12 into the skin 6 each 0   No current facility-administered medications for this visit.    Medication Side Effects: None  Allergies:  Allergies  Allergen Reactions   Aspartame And Phenylalanine Other (See Comments)    Migraines   Beef-Derived Products Other (See Comments)    severe cramping   Citrus Other (See Comments)    Causes Interstitial cystitis flares   Penicillins Hives and Rash    Denies airway involvement Has patient had a PCN reaction causing immediate rash, facial/tongue/throat swelling, SOB or lightheadedness with hypotension: yes hives.  Has patient had a PCN reaction causing severe rash involving mucus membranes or skin necrosis:no Has patient had a PCN reaction that required hospitalization No Has patient had a PCN reaction occurring within the last 10 years: No If all of the above answers are "NO", then may proceed with Cephalosporin use.    Promethazine Hcl Other (See Comments)    Muscle twitching and contracture    Adhesive [Tape]     Red splotches   Beta Vulgaris    Promethazine    Doxycycline Nausea And Vomiting   Morphine  Sulfate Nausea And Vomiting    Past Medical History:  Diagnosis Date   Allergy    Anemia    after birth of child 7yrs ago   Anxiety and depression    with insomnia   Blood transfusion without reported diagnosis    x3   Chronic back pain    ? RA, ? Lupus - reports saw rheumatologist in the past but better now, sees Dr. Dutch Quint   Eczema    GERD (gastroesophageal reflux disease)    takes Omeprazole daily, with nausea   Heart murmur    Hemorrhoids    History of kidney stones    passed on her own   History of migraine    last one a yr ago and takes Zomig prn   Hot flashes 11/10/2014  HTN (hypertension) 03/31/2012   Hx of echocardiogram    Echo (8/15):  EF 55-60%, no RWMA   IBS (irritable bowel syndrome)    constipation predominant   Interstitial cystitis    hx of UTI   Numbness    both legs and related to back   Osteoporosis    osteopenia   Palpitations    on metoprolol in past but made BP too low   Pneumonia    hx of;last time 32yrs ago   PONV (postoperative nausea and vomiting)    laryngospasm-1999   Poor vision 10/15/2015   -? Ocular rosacea -sees opthomology    Raynaud's disease /phenomenon 10/21/2012   no meds, worse in the winter or cold environment.    Rheumatoid aortitis    uses Diclofenac gel;Rheumatoid   Seasonal allergies    Flonase daily    Urinary incontinence     Past Medical History, Surgical history, Social history, and Family history were reviewed and updated as appropriate.   Please see review of systems for further details on the patient's review from today.   Objective:   Physical Exam:  There were no vitals taken for this visit.  Physical Exam Constitutional:      General: She is not in acute distress. Musculoskeletal:        General: No deformity.  Neurological:     Mental Status: She is alert and oriented to person, place, and time.     Coordination: Coordination normal.  Psychiatric:        Attention and Perception: Attention and  perception normal. She does not perceive auditory or visual hallucinations.        Mood and Affect: Mood normal. Mood is not anxious or depressed. Affect is not labile, blunt, angry or inappropriate.        Speech: Speech normal.        Behavior: Behavior normal.        Thought Content: Thought content normal. Thought content is not paranoid or delusional. Thought content does not include homicidal or suicidal ideation. Thought content does not include homicidal or suicidal plan.        Cognition and Memory: Cognition and memory normal.        Judgment: Judgment normal.     Comments: Insight intact    Lab Review:     Component Value Date/Time   NA 141 05/27/2021 1112   NA 143 12/22/2016 0948   K 5.0 05/27/2021 1112   CL 102 05/27/2021 1112   CO2 30 05/27/2021 1112   GLUCOSE 80 05/27/2021 1112   GLUCOSE 95 04/21/2006 1635   BUN 25 (H) 05/27/2021 1112   BUN 17 12/22/2016 0948   CREATININE 1.04 05/27/2021 1112   CALCIUM 9.8 07/04/2021 1150   PROT 6.9 03/29/2021 1243   PROT 7.0 12/22/2016 0948   ALBUMIN 4.6 03/29/2021 1243   ALBUMIN 4.7 12/22/2016 0948   AST 20 03/29/2021 1243   ALT 13 03/29/2021 1243   ALKPHOS 81 03/29/2021 1243   BILITOT 0.5 03/29/2021 1243   BILITOT 0.3 12/22/2016 0948   GFRNONAA 65 12/22/2016 0948   GFRAA 75 12/22/2016 0948       Component Value Date/Time   WBC 4.7 03/29/2021 1243   RBC 4.63 03/29/2021 1243   HGB 14.0 03/29/2021 1243   HGB 14.1 12/22/2016 0948   HCT 43.2 03/29/2021 1243   HCT 44.0 12/22/2016 0948   PLT 186.0 03/29/2021 1243   PLT 206 12/22/2016 0948   MCV 93.3 03/29/2021  1243   MCV 94 12/22/2016 0948   MCH 30.3 12/22/2016 0948   MCH 30.7 11/01/2015 1154   MCHC 32.4 03/29/2021 1243   RDW 13.7 03/29/2021 1243   RDW 14.3 12/22/2016 0948   LYMPHSABS 1.2 03/29/2021 1243   LYMPHSABS 0.9 12/22/2016 0948   MONOABS 0.5 03/29/2021 1243   EOSABS 0.2 03/29/2021 1243   EOSABS 0.1 12/22/2016 0948   BASOSABS 0.0 03/29/2021 1243   BASOSABS  0.0 12/22/2016 0948    No results found for: POCLITH, LITHIUM   No results found for: PHENYTOIN, PHENOBARB, VALPROATE, CBMZ   .res Assessment: Plan:    Plan:  PDMP reviewed  1. Xanax 0.5mg  TID - using some at bedtime 2. Lamictal 200mg  daily 3. Restart Prozac 20mg  daily 4. Seroquel 50mg  - 1 to 2 tablets.  RTC 3 months  Patient advised to contact office with any questions, adverse effects, or acute worsening in signs and symptoms.  Discussed potential benefits, risk, and side effects of benzodiazepines to include potential risk of tolerance and dependence, as well as possible drowsiness.  Advised patient not to drive if experiencing drowsiness and to take lowest possible effective dose to minimize risk of dependence and tolerance.  Time spent with patient was 25 minutes. Greater than 50% of face to face time with patient was spent on counseling and coordination of care.   Diagnoses and all orders for this visit:  Panic disorder -     FLUoxetine (PROZAC) 20 MG capsule; Take 1 capsule (20 mg total) by mouth daily.  Generalized anxiety disorder -     FLUoxetine (PROZAC) 20 MG capsule; Take 1 capsule (20 mg total) by mouth daily.     Please see After Visit Summary for patient specific instructions.  Future Appointments  Date Time Provider Department Center  08/27/2021  3:00 PM Mathis Fare, LCSW [facility] None  09/10/2021 11:00 AM Mathis Fare, LCSW [facility] None  01/22/2022 11:20 AM Aliece Honold, Thereasa Solo, NP [facility] None    No orders of the defined types were placed in this encounter.   -------------------------------

## 2021-08-21 ENCOUNTER — Telehealth: Payer: Self-pay | Admitting: Adult Health

## 2021-08-21 NOTE — Telephone Encounter (Signed)
Pt LVM stating that Barnett Applebaum wanted her to call back to let her know her job status.  So she wants Barnett Applebaum to call her.   Next appt 3/28

## 2021-08-21 NOTE — Telephone Encounter (Signed)
You can call her and see what she decided to do.

## 2021-08-21 NOTE — Telephone Encounter (Signed)
Received fax FMLA. Gave to [employer]

## 2021-08-21 NOTE — Telephone Encounter (Signed)
Are you able to speak to her?

## 2021-08-22 NOTE — Telephone Encounter (Signed)
Ok - will she be sending the paper work over?

## 2021-08-22 NOTE — Telephone Encounter (Signed)
She stated she is going to decrease her hours to 2 days a week in march

## 2021-08-22 NOTE — Telephone Encounter (Signed)
LVM to rtc and can leave message with front office

## 2021-08-23 DIAGNOSIS — Z0289 Encounter for other administrative examinations: Secondary | ICD-10-CM

## 2021-08-27 ENCOUNTER — Telehealth: Payer: Self-pay | Admitting: Adult Health

## 2021-08-27 ENCOUNTER — Ambulatory Visit: Payer: 59 | Admitting: Psychiatry

## 2021-08-27 NOTE — Telephone Encounter (Signed)
Received FMLA form from [employer] for completion of FMLA form. Placed in Traci's box.

## 2021-09-04 ENCOUNTER — Ambulatory Visit: Payer: 59 | Admitting: Internal Medicine

## 2021-09-04 ENCOUNTER — Other Ambulatory Visit: Payer: Self-pay

## 2021-09-04 ENCOUNTER — Ambulatory Visit (INDEPENDENT_AMBULATORY_CARE_PROVIDER_SITE_OTHER)
Admission: RE | Admit: 2021-09-04 | Discharge: 2021-09-04 | Disposition: A | Discharge status: Home / Self Care | Payer: 59 | Source: Ambulatory Visit | Attending: Internal Medicine | Admitting: Internal Medicine

## 2021-09-04 ENCOUNTER — Encounter: Payer: Self-pay | Admitting: Internal Medicine

## 2021-09-04 VITALS — BP 110/78 | HR 55 | Ht 62.0 in | Wt 125.0 lb

## 2021-09-04 DIAGNOSIS — K5900 Constipation, unspecified: Secondary | ICD-10-CM

## 2021-09-04 DIAGNOSIS — Z981 Arthrodesis status: Secondary | ICD-10-CM | POA: Diagnosis not present

## 2021-09-04 DIAGNOSIS — R140 Abdominal distension (gaseous): Secondary | ICD-10-CM | POA: Diagnosis not present

## 2021-09-04 DIAGNOSIS — I878 Other specified disorders of veins: Secondary | ICD-10-CM | POA: Diagnosis not present

## 2021-09-04 NOTE — Progress Notes (Signed)
[name] 66 y.o. [birth year] [id number]  Assessment & Plan:   Encounter Diagnosis  Name Primary?   Constipation, unspecified constipation type Yes    Symptoms are about a month in duration without clear etiology at this point.  We will get a 2 view abdomen and see where to go with this.  CC: Kuneff, Renee A, DO  Subjective:   Chief Complaint: Constipation  HPI 66 year old woman with a history of functional dyspepsia, constipation in the past hemorrhoidal bleeding not seen in some time (several years) who for the past month has had a lot of difficulty with defecation.  Other past medical history includes interstitial cystitis and IBS.  GERD.    She is describing lack of urge straining when she has to go and it takes multiple doses of MiraLAX in a purge fashion to produce a bowel movement.  She has not had any rectal bleeding.  No new medications.  TSH normal in November 2022.  Wt Readings from Last 3 Encounters:  09/04/21 125 lb (56.7 kg)  07/04/21 117 lb (53.1 kg)  05/27/21 116 lb (52.6 kg)     Normal colonoscopy 2014  Allergies  Allergen Reactions   Aspartame And Phenylalanine Other (See Comments)    Migraines   Beef-Derived Products Other (See Comments)    severe cramping   Citrus Other (See Comments)    Causes Interstitial cystitis flares   Penicillins Hives and Rash    Denies airway involvement Has patient had a PCN reaction causing immediate rash, facial/tongue/throat swelling, SOB or lightheadedness with hypotension: yes hives.  Has patient had a PCN reaction causing severe rash involving mucus membranes or skin necrosis:no Has patient had a PCN reaction that required hospitalization No Has patient had a PCN reaction occurring within the last 10 years: No If all of the above answers are "NO", then may proceed with Cephalosporin use.    Promethazine Hcl Other (See Comments)    Muscle twitching and contracture    Adhesive [Tape]     Red splotches    Beta Vulgaris    Promethazine    Doxycycline Nausea And Vomiting   Morphine Sulfate Nausea And Vomiting   Current Meds  Medication Sig   ALPRAZolam (XANAX) 0.5 MG tablet TAKE 1 TABLET BY MOUTH 3 TIMES DAILY AS NEEDED FOR ANXIETY   atenolol (TENORMIN) 25 MG tablet Take 1 tablet (25 mg total) by mouth daily.   B-D 3CC LUER-LOK SYR 25GX1" 25G X 1" 3 ML MISC USE WITH CYANOCOBALAMIN EVERY 14 DAYS   cholecalciferol (VITAMIN D) 1000 UNITS tablet Take 1,000 Units by mouth every evening.   clobetasol (TEMOVATE) 0.05 % external solution APPLY TOPICALLY ONCE DAILY AS NEEDED FOR DERMATITIS   cyanocobalamin (,VITAMIN B-12,) 1000 MCG/ML injection INJECT 1 ML INTO THE MUSCLE EVERY 14 DAYS   cyclobenzaprine (FLEXERIL) 10 MG tablet Take 1/2 to 1 tablet by mouth every 8 to 12 hours as needed for acute muscle spasms   fluorometholone (FML) 0.1 % ophthalmic suspension 1 drop 3 (three) times daily.   FLUoxetine (PROZAC) 20 MG capsule Take 1 capsule by mouth daily.   fluticasone (FLONASE) 50 MCG/ACT nasal spray Place 2 sprays into the nose daily.   hydrocortisone (ANUSOL-HC) 2.5 % rectal cream Place 1 application rectally 2 (two) times daily.   lamoTRIgine (LAMICTAL) 200 MG tablet Take 1 tablet (200 mg total) by mouth at bedtime.   omeprazole (PRILOSEC) 40 MG capsule Take 1 capsule (40 mg total) by mouth daily before breakfast. (  Patient taking differently: Take 40 mg by mouth as needed.)   QUEtiapine (SEROQUEL) 50 MG tablet Take 1-2 tablets by mouth at bedtime for sleep.   sucralfate (CARAFATE) 1 GM/10ML suspension TAKE 10 MLS BY MOUTH 3 TIMES DAILY AS NEEDED FOR DIGESTION   SYRINGE-NEEDLE, DISP, 3 ML (B-D 3CC LUER-LOK SYR 25GX1") 25G X 1" 3 ML MISC USE WITH CYANOCOBALAMIN EVERY 14 DAYS   SYRINGE-NEEDLE, DISP, 3 ML (B-D 3CC LUER-LOK SYR 25GX1") 25G X 1" 3 ML MISC Use as directed to inject b-12 into the skin   Past Medical History:  Diagnosis Date   Allergy    Anemia    after birth of child 66yrs ago    Anxiety and depression    with insomnia   Blood transfusion without reported diagnosis    x3   Chronic back pain    ? RA, ? Lupus - reports saw rheumatologist in the past but better now, sees Dr. Trenton Gammon   Eczema    GERD (gastroesophageal reflux disease)    takes Omeprazole daily, with nausea   Heart murmur    Hemorrhoids    History of kidney stones    passed on her own   History of migraine    last one a yr ago and takes Zomig prn   Hot flashes 11/10/2014   HTN (hypertension) 03/31/2012   Hx of echocardiogram    Echo (8/15):  EF 55-60%, no RWMA   IBS (irritable bowel syndrome)    constipation predominant   Interstitial cystitis    hx of UTI   Numbness    both legs and related to back   Osteoporosis    osteopenia   Palpitations    on metoprolol in past but made BP too low   Pneumonia    hx of;last time 31yrs ago   PONV (postoperative nausea and vomiting)    laryngospasm-1999   Poor vision 10/15/2015   -? Ocular rosacea -sees opthomology    Raynaud's disease /phenomenon 10/21/2012   no meds, worse in the winter or cold environment.    Rheumatoid aortitis    uses Diclofenac gel;Rheumatoid   Seasonal allergies    Flonase daily    Urinary incontinence    Past Surgical History:  Procedure Laterality Date   ABDOMINAL HYSTERECTOMY  1997   BREAST BIOPSY  1999   benign   COLONOSCOPY  02/11/2013 and 12/24/04   internal and external hemorrhoids   ENDOMETRIAL ABLATION     EXCISION [facility]ORTON'S NEUROMA Right    FLEXIBLE SIGMOIDOSCOPY  03/07/2008   internal and external hemorrhoids   POSTERIOR LUMBAR FUSION  05/02/2013   L4-L5 fusion (cage/screws/bone graft) Dr. Annette Stable   TONSILLECTOMY     UPPER GASTROINTESTINAL ENDOSCOPY  10/23/2010   gastritis, irregular Z-line   wisdom teeth extracted     Social History   Social History Narrative   Spiritual Beliefs: methodist   Marital status/children/pets: In second marriage.  First husband committed suicide.  2 children.    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   Safety:      -Wears a bicycle helmet riding a bike: Yes     -smoke alarm in the home:No     - wears seatbelt: Yes     - Feels safe in their relationships: Yes            family history includes Alcohol abuse in her daughter, father, paternal grandfather, sister, and son; Arthritis in her paternal grandmother; Coronary artery disease in her father; Dementia  in her paternal grandmother; Depression in her daughter, father, mother, paternal grandmother, sister, and son; Diabetes in her paternal uncle; Diverticulosis in her mother; Drug abuse in her daughter; Early death in her father, maternal grandfather, and paternal grandfather; Heart attack in her father; Hyperlipidemia in her father, mother, and paternal grandfather; Hypertension in her father, maternal grandfather, maternal grandmother, mother, and paternal grandfather; Kidney disease in her father and mother; Mental illness in her father and paternal grandmother; Osteoarthritis in her mother; Pancreatic cancer in her maternal grandfather; Stroke in her maternal grandmother and paternal grandfather.   Review of Systems As per HPI.  She has a osteoarthritic thumb joint and is contemplating replacement because she has failed conservative therapy.  Objective:   Physical Exam BP 110/78    Pulse (!) 55    Ht 5\' 2"  (1.575 m)    Wt 125 lb (56.7 kg)    SpO2 97%    BMI 22.86 kg/m  Well-developed well-nourished no acute distress Abd soft NT and no mass/hernia  Rovanda Beatty CMA present  Anoderm inspection revealed no abnormalities Anal wink was absent Digital exam revealed normal resting tone and voluntary squeeze. No mass but small rectocele present. Simulated defecation with valsalva revealed appropriate abdominal contraction and descent.

## 2021-09-04 NOTE — Patient Instructions (Addendum)
Your provider has requested that you have an abdominal x ray before leaving today. Please go to the basement floor to our Radiology department for the test.  Further work -up will be recommended after xray.  If you are age 65 or older, your body mass index should be between 23-30. Your Body mass index is 22.86 kg/m. If this is out of the aforementioned range listed, please consider follow up with your Primary Care Provider.  If you are age 32 or younger, your body mass index should be between 19-25. Your Body mass index is 22.86 kg/m. If this is out of the aformentioned range listed, please consider follow up with your Primary Care Provider.   ________________________________________________________  The [facility] providers would like to encourage you to use [facility] to communicate with providers for non-urgent requests or questions.  Due to long hold times on the telephone, sending your provider a message by [facility] may be a faster and more efficient way to get a response.  Please allow 48 business hours for a response.  Please remember that this is for non-urgent requests.  _______________________________________________________  Thank you for choosing me and [facility].  Dr Silvano Rusk

## 2021-09-10 ENCOUNTER — Ambulatory Visit: Payer: 59 | Admitting: Psychiatry

## 2021-09-10 ENCOUNTER — Other Ambulatory Visit: Payer: Self-pay

## 2021-09-10 DIAGNOSIS — F411 Generalized anxiety disorder: Secondary | ICD-10-CM

## 2021-09-10 NOTE — Progress Notes (Signed)
[facility] Counselor/Therapist Progress Note  Patient ID: [name], MRN: [id number],    Date: 09/10/2021  Time Spent: 50 minutes   Treatment Type: Individual Therapy  Reported Symptoms: anxiety increased, depression "gone", anger and frustration decreased  Mental Status Exam:  Appearance:   Casual     Behavior:  Appropriate, Sharing, and Motivated  Motor:  Normal  Speech/Language:   Clear and Coherent  Affect:  anxious  Mood:  anxious  Thought process:  goal directed  Thought content:    WNL  Sensory/Perceptual disturbances:    WNL  Orientation:  oriented to person, place, time/date, situation, day of week, month of year, year, and stated date of Feb. 28, 2023  Attention:  Good  Concentration:  Good  Memory:  WNL  Fund of knowledge:   Good  Insight:    Good  Judgment:   Good  Impulse Control:  Good   Risk Assessment: Danger to Self:  No Self-injurious Behavior: No Danger to Others: No Duty to Warn:no Physical Aggression / Violence:No  Access to Firearms a concern: No  Gang Involvement:No   Subjective:  Patient in today reporting anxiety, frustration, anger improved, and frustration.  Has made some decisions about her work and eventual retirement. Work continues to be very stressful. Processed this in more detail today looking at what would be in her best interest, and setting necessary boundaries.  (Not all details shared in this note due to patient privacy needs.) States I'm "really looking at my priorities and taking care of myself physically and mentally/emotionally. Work still very stressful but is going to 2 days per week starting in March.this was a good follow-up step from her prior session when she was to think about what she really needed to do in terms of her working hours being overwhelming for her at this point in her life.  Sleep still an issue and finds that reading helps her in getting to sleep. Not feeling as overwhelmed by work environment.  Describes  her obsessiveness and overthinking as "moderate" which reflects some decrease.  Better self-care, smiling more, and not feeling as stressed.  Interventions: Solution-Oriented/Positive Psychology and Insight-Oriented  Treatment goal plan:  Patient not signing the tx plan on computer screen due to COVID Treatment Goals: Goals remain on treatment plan as patient works on strategies to meet her goals.  Progress will be noted each session and documented in "Progress" section of treatment plan. Long term goal: Develop healthy cognitive patterns and beliefs about self and the world that lead to alleviation of depression and anxiety and help prevent relapse.  Progressing: Patient is motivated. Struggling to focus some due to immediate, unexpected health concern with husband.  Short term goal: Learn and implement personal skills for managing stress, solving daily problems, and resolving conflicts effectively. Strategy: Teach patient calming skills, problem solving skills, and conflict resolution skills, to better manage daily stressors.  Diagnosis:   ICD-10-CM   1. Generalized anxiety disorder  F41.1      Plan:  Patient today showing motivation and active engagement in session as she worked on her anxiety, frustration (decreased), and shared some of the decisions she has made more recently to set better limits with working hours as well as some limit she needed to set within her extended family.  Looking for more opportunities for she and her husband to spend some time together doing things they enjoy and feels that she is making some good decisions for her own self-care.  Anger and frustration have decreased some as well as her irritability.  Seemed calmer today and having more clarity in her decision making has helped.  Urged her to continue and her improved self-care and in making healthier decisions for herself. Encouraged patient in her practice of more positive behaviors including:  Believing in herself more and that she can make important changes that can influence her role within the family and at work, for every negative thought create 2 positives, focus more on her own self-care, stay in touch with people that are supportive, practice more consistent positive self talk, continue spending time outdoors in activities she enjoys especially biking, pursuing meaningful conversation with husband, take breaks as needed to experience more calmness for herself, intentionally looking for more positives each day, searching for the positives within herself and make a list of them, provide helpful feedback to husband regarding the positives when they occur in their relationship, maintain healthy boundaries with others as needed, reflect on any progress made, and recognize the strength she shows working with goal-directed behaviors in a direction that supports improved emotional health.  Goal review and progress/challenges noted with patient.  Next appointment within 3 weeks.  This record has been created using [employer].  Chart creation errors have been sought, but may not always have been located and corrected.  Such creation errors do not reflect on the standard of medical care provided.  Shanon Ace, LCSW

## 2021-09-13 ENCOUNTER — Other Ambulatory Visit: Payer: Self-pay | Admitting: Neurosurgery

## 2021-09-13 DIAGNOSIS — M4316 Spondylolisthesis, lumbar region: Secondary | ICD-10-CM

## 2021-09-20 DIAGNOSIS — M1811 Unilateral primary osteoarthritis of first carpometacarpal joint, right hand: Secondary | ICD-10-CM | POA: Diagnosis not present

## 2021-09-24 ENCOUNTER — Ambulatory Visit (INDEPENDENT_AMBULATORY_CARE_PROVIDER_SITE_OTHER): Payer: 59 | Admitting: Psychiatry

## 2021-09-24 ENCOUNTER — Other Ambulatory Visit: Payer: Self-pay

## 2021-09-24 DIAGNOSIS — F411 Generalized anxiety disorder: Secondary | ICD-10-CM | POA: Diagnosis not present

## 2021-09-24 NOTE — Progress Notes (Signed)
?    [facility] Counselor/Therapist Progress Note ? ?Patient ID: [name], MRN: [id number],   ? ?Date: 09/24/2021 ? ?Time Spent: 55 minutes  ? ?Treatment Type: Individual Therapy ? ?Reported Symptoms: anxiety, "a little depression", over-worrying ? ?Mental Status Exam: ? ?Appearance:   Casual     ?Behavior:  Appropriate, Sharing, and Motivated  ?Motor:  Normal  ?Speech/Language:   Clear and Coherent  ?Affect:  Depressed and anxious  ?Mood:  anxious and depressed  ?Thought process:  goal directed  ?Thought content:    overthinking  ?Sensory/Perceptual disturbances:    WNL  ?Orientation:  oriented to person, place, time/date, situation, day of week, month of year, year, and stated date of September 24, 2021  ?Attention:  Good  ?Concentration:  Good  ?Memory:  WNL  ?Fund of knowledge:   Good  ?Insight:    Good  ?Judgment:   Good  ?Impulse Control:  Good  ? ?Risk Assessment: ?Danger to Self:  No ?Self-injurious Behavior: No ?Danger to Others: No ?Duty to Warn:no ?Physical Aggression / Violence:No  ?Access to Firearms a concern: No  ?Gang Involvement:No  ? ?Subjective:   Patient in today reporting anxiety, some depression, stressed re: potential retirement issues, and excessive worrying which we ended up focusing on more. Discussed ways she can interrupt her worrying in order to replace it with more encouraging and productive thoughts, and practiced this with several specific examples.  Patient beginning to realize how much the worrying has held her back and is motivated to continue working on this as "it probably affects my blood pressure", "makes me see people in a negative way," and "I focus more on negatives versus positives." Beginning to see how her negative thinking and worrying have fed her depression. More insightful and to follow up with healthier thought patterns and interrupting negativity and worrying between sessions. ? ?Interventions: Solution-Oriented/Positive Psychology, Ego-Supportive, and  Insight-Oriented ? ?Treatment goal plan:  ?Patient not signing the tx plan on computer screen due to [place] ?Treatment Goals: ?Goals remain on treatment plan as patient works on strategies to meet her goals.  Progress will be noted each session and documented in "Progress" section of treatment plan. ?Long term goal: ?Develop healthy cognitive patterns and beliefs about self and the world that lead to alleviation of depression and anxiety and help prevent relapse.  ?Progressing: ?Patient is motivated. Struggling to focus some due to immediate, unexpected health concern with husband.  ?Short term goal: ?Learn and implement personal skills for managing stress, solving daily problems, and resolving conflicts effectively. ?Strategy: ?Teach patient calming skills, problem solving skills, and conflict resolution skills, to better manage daily stressors. ? ?Diagnosis: ?  ICD-10-CM   ?1. Generalized anxiety disorder  F41.1   ?  ? ?Plan:   Today showing motivation and active participation in session as she worked further on her anxiety and some depression, both related to personal, work, and pending retirement issues. "I need to stop worrying so much but haven't been able to do so yet." Discussed this at length today and she acknowledged that she's been a worrier for a long time as she felt that would help protect her." Looked at how worrying really doesn't protect [place] but rather holds [place] back and creates fears, increased anxiety, and negativity. Focus more on letting go of her worrying, in which patient was very involved. To follow up on this more between sessions. Encouraged patient in her practice of more positive behaviors including: Focus heavily between sessions on our  discussion today and letting go of negativity and over worrying in order to see more positives and experience more hope, believing in herself more and that she can make important changes that can influence her role within the family and at work, for every  negative thought create 2 positives, focus more on her own self care, staying in touch with people that are supportive, practice more consistent positive self talk, continue spending time outdoors in activities she enjoys especially biking, pursuing meaningful conversation with husband, take breaks as needed to experience more calmness for herself intentionally looking for more positives each day, searching for the positives within herself and be able to name them, provide helpful feedback to husband regarding the positives when they occur in their relationship, maintain healthy boundaries with others as needed, reflect on any progress made daily, and realize the strength she shows working with goal-directed behaviors in a direction that supports her improved emotional health and wellbeing. ? ?Goal review and progress/challenges noted with patient. ? ?Next appointment within 2 weeks. ? ?This record has been created using [employer].  Chart creation errors have been sought, but may not always have been located and corrected.  Such creation errors do not reflect on the standard of medical care provided. ? ? ?Shanon Ace, LCSW ? ? ? ? ? ? ? ? ? ? ? ? ? ? ? ? ? ? ?

## 2021-10-02 ENCOUNTER — Other Ambulatory Visit: Payer: Self-pay

## 2021-10-02 ENCOUNTER — Ambulatory Visit
Admission: RE | Admit: 2021-10-02 | Discharge: 2021-10-02 | Disposition: A | Discharge status: Home / Self Care | Payer: 59 | Source: Ambulatory Visit | Attending: Neurosurgery | Admitting: Neurosurgery

## 2021-10-02 DIAGNOSIS — M4316 Spondylolisthesis, lumbar region: Secondary | ICD-10-CM

## 2021-10-02 DIAGNOSIS — M5416 Radiculopathy, lumbar region: Secondary | ICD-10-CM | POA: Diagnosis not present

## 2021-10-02 MED ORDER — METHYLPREDNISOLONE ACETATE 40 MG/ML INJ SUSP (RADIOLOG
80.0000 mg | Freq: Once | INTRAMUSCULAR | Status: AC
  Administered 2021-10-02: 80 mg via EPIDURAL

## 2021-10-02 MED ORDER — IOPAMIDOL (ISOVUE-M 200) INJECTION 41%
1.0000 mL | Freq: Once | INTRAMUSCULAR | Status: AC
  Administered 2021-10-02: 1 mL via EPIDURAL

## 2021-10-02 NOTE — Discharge Instructions (Signed)

## 2021-10-08 ENCOUNTER — Other Ambulatory Visit (HOSPITAL_COMMUNITY): Payer: Self-pay

## 2021-10-08 ENCOUNTER — Ambulatory Visit: Payer: 59 | Admitting: Adult Health

## 2021-10-09 ENCOUNTER — Other Ambulatory Visit: Payer: Self-pay

## 2021-10-09 ENCOUNTER — Other Ambulatory Visit (HOSPITAL_COMMUNITY): Payer: Self-pay

## 2021-10-09 ENCOUNTER — Ambulatory Visit: Payer: 59 | Admitting: Adult Health

## 2021-10-09 ENCOUNTER — Encounter: Payer: Self-pay | Admitting: Adult Health

## 2021-10-09 DIAGNOSIS — G4700 Insomnia, unspecified: Secondary | ICD-10-CM | POA: Diagnosis not present

## 2021-10-09 DIAGNOSIS — F411 Generalized anxiety disorder: Secondary | ICD-10-CM

## 2021-10-09 DIAGNOSIS — F331 Major depressive disorder, recurrent, moderate: Secondary | ICD-10-CM | POA: Diagnosis not present

## 2021-10-09 DIAGNOSIS — F410 Panic disorder [episodic paroxysmal anxiety] without agoraphobia: Secondary | ICD-10-CM | POA: Diagnosis not present

## 2021-10-09 DIAGNOSIS — F4310 Post-traumatic stress disorder, unspecified: Secondary | ICD-10-CM | POA: Diagnosis not present

## 2021-10-09 MED ORDER — BELSOMRA 10 MG PO TABS
10.0000 mg | ORAL_TABLET | Freq: Every evening | ORAL | 2 refills | Status: DC
  Filled 2021-10-09: qty 30, 30d supply, fill #0

## 2021-10-09 NOTE — Progress Notes (Signed)
[name] ?[id number] ?26-Nov-1955 ?66 y.o. ? ?Subjective:  ? ?Patient ID:  [name] is a 66 y.o. (DOB October 25, 1955) female. ? ?Chief Complaint: No chief complaint on file. ? ? ?HPI ?[name] presents to the office today for follow-up of MDD, GAD, panic disorder, insomnia and PTSD. ? ?Describes mood today as "better". Pleasant. Decreased tearfulness. Mood symptoms - reports lots of anxiety - situational related. Denies depression and irritability. Reporting panic attacks. Reporting worry and rumination - some days more than others. Stating - "I feel like I'm doing better". Feeling overwhelmed - on occasion. Increased stress in the work setting - "handling it a little better". Reporting decreased hip pain after recent injection. Concerned about husband's health. Improved interest and motivation. Seeing therapist - Rinaldo Cloud. Taking medications as prescribed.  ?Energy levels stable. Active, has a regular exercise routine - bike riding.  ?Enjoys some usual interests and activities. Married. Lives with husband of 16 years. Mother lives in [place]. Spending time with family. ?Appetite adequate. Weight loss - 112 from 118 pounds. ?Sleeps better some nights than other. Averages 6 hours. Taking hours to get to sleep. Seroquel causing night sweats. ?Focus and concentration stable. Completing tasks. Managing aspects of household. Works full time - 3 days a week - Therapist, sports - endoscopy. Does not feel like she can continue working current schedule. ?Denies SI or HI.  ?Denies AH or VH. ? ?Previous medication trials: Remeron, Trazadone, Restoril, Xanax, Seroquel ? ?GAD-7   ? ?[place] Office Visit from 04/27/2019 in [place] Office Visit from 12/29/2018 in [place]  ?Total GAD-7 Score 11 18  ? ?  ? ?Mini-Mental   ? ?[place] Office Visit from 12/22/2016 in [facility]  ?Total Score (max 30 points ) 30  ? ?  ? ?PHQ2-9   ? ?[place] Office  Visit from 05/27/2021 in [place] Office Visit from 03/29/2021 in [place] Office Visit from 05/23/2020 in [place] Office Visit from 04/27/2019 in [place] Office Visit from 12/29/2018 in [place]  ?PHQ-2 Total Score 0 0 0 0 5  ?PHQ-9 Total Score -- 0 -- -- 17  ? ?  ?  ? ?Review of Systems:  ?Review of Systems  ?Musculoskeletal:  Negative for gait problem.  ?Neurological:  Negative for tremors.  ?Psychiatric/Behavioral:    ?     Please refer to HPI  ? ?Medications: I have reviewed the patient's current medications. ? ?Current Outpatient Medications  ?Medication Sig Dispense Refill  ? Suvorexant (BELSOMRA) 10 MG TABS Take 10 mg by mouth at bedtime. 30 tablet 2  ? ALPRAZolam (XANAX) 0.5 MG tablet TAKE 1 TABLET BY MOUTH 3 TIMES DAILY AS NEEDED FOR ANXIETY 270 tablet 1  ? atenolol (TENORMIN) 25 MG tablet Take 1 tablet (25 mg total) by mouth daily. 90 tablet 1  ? B-D 3CC LUER-LOK SYR 25GX1" 25G X 1" 3 ML MISC USE WITH CYANOCOBALAMIN EVERY 14 DAYS    ? cholecalciferol (VITAMIN D) 1000 UNITS tablet Take 1,000 Units by mouth every evening.    ? clobetasol (TEMOVATE) 0.05 % external solution APPLY TOPICALLY ONCE DAILY AS NEEDED FOR DERMATITIS 50 mL 2  ? cyanocobalamin (,VITAMIN B-12,) 1000 MCG/ML injection INJECT 1 ML INTO THE MUSCLE EVERY 14 DAYS 6 mL 0  ? cyclobenzaprine (FLEXERIL) 10 MG tablet Take  1/2 to 1 tablet by mouth every 8 to 12 hours as needed for acute muscle spasms 30 tablet 2  ? fluorometholone (FML) 0.1 % ophthalmic suspension 1 drop 3 (three) times daily.    ? FLUoxetine (PROZAC) 20 MG capsule Take 1 capsule by mouth daily. 90 capsule 3  ? fluticasone (FLONASE) 50 MCG/ACT nasal spray Place 2 sprays into the nose daily. 16 g 5  ? hydrocortisone (ANUSOL-HC) 2.5 % rectal cream Place 1 application rectally 2 (two) times daily. 30 g 1  ? lamoTRIgine (LAMICTAL) 200 MG tablet  Take 1 tablet (200 mg total) by mouth at bedtime. 90 tablet 3  ? omeprazole (PRILOSEC) 40 MG capsule Take 1 capsule (40 mg total) by mouth daily before breakfast. (Patient taking differently: Take 40 mg by mouth as needed.) 90 capsule 1  ? sucralfate (CARAFATE) 1 GM/10ML suspension TAKE 10 MLS BY MOUTH 3 TIMES DAILY AS NEEDED FOR DIGESTION 840 mL 2  ? SYRINGE-NEEDLE, DISP, 3 ML (B-D 3CC LUER-LOK SYR 25GX1") 25G X 1" 3 ML MISC USE WITH CYANOCOBALAMIN EVERY 14 DAYS 6 each 0  ? SYRINGE-NEEDLE, DISP, 3 ML (B-D 3CC LUER-LOK SYR 25GX1") 25G X 1" 3 ML MISC Use as directed to inject b-12 into the skin 6 each 0  ? ?No current facility-administered medications for this visit.  ? ? ?Medication Side Effects: None ? ?Allergies:  ?Allergies  ?Allergen Reactions  ? Aspartame And Phenylalanine Other (See Comments)  ?  Migraines  ? Beef-Derived Products Other (See Comments)  ?  severe cramping  ? Citrus Other (See Comments)  ?  Causes Interstitial cystitis flares  ? Penicillins Hives and Rash  ?  Denies airway involvement ?Has patient had a PCN reaction causing immediate rash, facial/tongue/throat swelling, SOB or lightheadedness with hypotension: yes hives.  ?Has patient had a PCN reaction causing severe rash involving mucus membranes or skin necrosis:no ?Has patient had a PCN reaction that required hospitalization No ?Has patient had a PCN reaction occurring within the last 10 years: No ?If all of the above answers are "NO", then may proceed with Cephalosporin use. ?  ? Promethazine Hcl Other (See Comments)  ?  Muscle twitching and contracture   ? Adhesive [Tape]   ?  Red splotches  ? Beta Vulgaris   ? Promethazine   ? Doxycycline Nausea And Vomiting  ? Morphine Sulfate Nausea And Vomiting  ? ? ?Past Medical History:  ?Diagnosis Date  ? Allergy   ? Anemia   ? after birth of child 75yr ago  ? Anxiety and depression   ? with insomnia  ? Blood transfusion without reported diagnosis   ? x3  ? Chronic back pain   ? ? RA, ? Lupus -  reports saw rheumatologist in the past but better now, sees Dr. PTrenton Gammon ? Eczema   ? GERD (gastroesophageal reflux disease)   ? takes Omeprazole daily, with nausea  ? Heart murmur   ? Hemorrhoids   ? History of kidney stones   ? passed on her own  ? History of migraine   ? last one a yr ago and takes Zomig prn  ? Hot flashes 11/10/2014  ? HTN (hypertension) 03/31/2012  ? Hx of echocardiogram   ? Echo (8/15):  EF 55-60%, no RWMA  ? IBS (irritable bowel syndrome)   ? constipation predominant  ? Interstitial cystitis   ? hx of UTI  ? Numbness   ? both legs and related to back  ? Osteoporosis   ?  osteopenia  ? Palpitations   ? on metoprolol in past but made BP too low  ? Pneumonia   ? hx of;last time 9yr ago  ? PONV (postoperative nausea and vomiting)   ? laryngospasm-1999  ? Poor vision 10/15/2015  ? -? Ocular rosacea -sees opthomology   ? Raynaud's disease /phenomenon 10/21/2012  ? no meds, worse in the winter or cold environment.   ? Rheumatoid aortitis   ? uses Diclofenac gel;Rheumatoid  ? Seasonal allergies   ? Flonase daily   ? Urinary incontinence   ? ? ?Past Medical History, Surgical history, Social history, and Family history were reviewed and updated as appropriate.  ? ?Please see review of systems for further details on the patient's review from today.  ? ?Objective:  ? ?Physical Exam:  ?There were no vitals taken for this visit. ? ?Physical Exam ?Constitutional:   ?   General: She is not in acute distress. ?Musculoskeletal:     ?   General: No deformity.  ?Neurological:  ?   Mental Status: She is alert and oriented to person, place, and time.  ?   Coordination: Coordination normal.  ?Psychiatric:     ?   Attention and Perception: Attention and perception normal. She does not perceive auditory or visual hallucinations.     ?   Mood and Affect: Mood normal. Mood is not anxious or depressed. Affect is not labile, blunt, angry or inappropriate.     ?   Speech: Speech normal.     ?   Behavior: Behavior normal.     ?    Thought Content: Thought content normal. Thought content is not paranoid or delusional. Thought content does not include homicidal or suicidal ideation. Thought content does not include homicidal or

## 2021-10-10 ENCOUNTER — Other Ambulatory Visit (HOSPITAL_COMMUNITY): Payer: Self-pay

## 2021-10-14 DIAGNOSIS — D239 Other benign neoplasm of skin, unspecified: Secondary | ICD-10-CM | POA: Diagnosis not present

## 2021-10-14 DIAGNOSIS — L821 Other seborrheic keratosis: Secondary | ICD-10-CM | POA: Diagnosis not present

## 2021-10-14 DIAGNOSIS — L219 Seborrheic dermatitis, unspecified: Secondary | ICD-10-CM | POA: Diagnosis not present

## 2021-10-14 DIAGNOSIS — L578 Other skin changes due to chronic exposure to nonionizing radiation: Secondary | ICD-10-CM | POA: Diagnosis not present

## 2021-10-14 DIAGNOSIS — D1801 Hemangioma of skin and subcutaneous tissue: Secondary | ICD-10-CM | POA: Diagnosis not present

## 2021-10-14 DIAGNOSIS — L814 Other melanin hyperpigmentation: Secondary | ICD-10-CM | POA: Diagnosis not present

## 2021-10-22 ENCOUNTER — Ambulatory Visit: Payer: 59 | Admitting: Psychiatry

## 2021-11-06 ENCOUNTER — Ambulatory Visit: Payer: 59 | Admitting: Psychiatry

## 2021-11-07 ENCOUNTER — Ambulatory Visit (INDEPENDENT_AMBULATORY_CARE_PROVIDER_SITE_OTHER): Payer: 59 | Admitting: Psychiatry

## 2021-11-07 DIAGNOSIS — F411 Generalized anxiety disorder: Secondary | ICD-10-CM

## 2021-11-07 NOTE — Progress Notes (Signed)
?    [facility] Counselor/Therapist Progress Note ? ?Patient ID: [name], MRN: [id number],   ? ?Date: 11/07/2021 ? ?Time Spent: 50 minutes  ? ?Treatment Type: Individual Therapy ? ?Reported Symptoms: anxiety, some depression ? ?Mental Status Exam: ? ?Appearance:   Casual     ?Behavior:  Appropriate, Sharing, and Motivated  ?Motor:  Normal  ?Speech/Language:   Clear and Coherent  ?Affect:  Depressed and anxious  ?Mood:  anxious and depressed  ?Thought process:  goal directed  ?Thought content:    Overthinking especially at night  ?Sensory/Perceptual disturbances:    WNL  ?Orientation:  oriented to person, place, time/date, situation, day of week, month of year, year, and stated date of November 07, 2021  ?Attention:  Good  ?Concentration:  Good  ?Memory:  WNL  ?Fund of knowledge:   Good  ?Insight:    Good  ?Judgment:   Good  ?Impulse Control:  Good  ? ?Risk Assessment: ?Danger to Self:  No ?Self-injurious Behavior: No ?Danger to Others: No ?Duty to Warn:no ?Physical Aggression / Violence:No  ?Access to Firearms a concern: No  ?Gang Involvement:No  ? ?Subjective: Patient today reporting anxiety, some depression , and significant work stress. Not feeling ready to make any "big changes." Worrying has decreased some. Very stressed with work and relationship with  family. Mother's memory is worsening. Communication issues with husband. Needed session today to focus on the personal stress she feels with work situations and home issues. (Not all details included in this note.) Reviewed some coping skills with certain types of stressors she faces on daily basis.  Also processed more regarding a traumatic incident that occurred in her work area that she is still working through emotionally and making progress.  Is more aware of how worrying has held her back and feels good about some of the progress she has made in decreasing it.  Has made some progress already but trying to increase her focus on positives versus  negatives whether it is at work or home.  Increased insight and awareness as to how her worrying and negative thinking tended to feed her depression, and that has improved some.  Continuing to work on this and interrupt unhealthy thought patterns while trying to replace with more positive and realistic thought patterns. ? ?Interventions: Solution-Oriented/Positive Psychology, Ego-Supportive, and Insight-Oriented ?  ?Treatment goal plan:  ?Patient not signing the tx plan on computer screen due to [place] ?Treatment Goals: ?Goals remain on treatment plan as patient works on strategies to meet her goals.  Progress will be noted each session and documented in "Progress" section of treatment plan. ?Long term goal: ?Develop healthy cognitive patterns and beliefs about self and the world that lead to alleviation of depression and anxiety and help prevent relapse.  ?Progressing: ?Patient is motivated. Struggling to focus some due to immediate, unexpected health concern with husband.  ?Short term goal: ?Learn and implement personal skills for managing stress, solving daily problems, and resolving conflicts effectively. ?Strategy: ?Teach patient calming skills, problem solving skills, and conflict resolution skills, to better manage daily stressors. ? ?Diagnosis: ?  ICD-10-CM   ?1. Generalized anxiety disorder  F41.1   ?  ? ?Plan: Patient showing good motivation and active engagement in session today as she continued working on some of her heightened stress issues at work and in relationship at home.  Was able to talk through multiple situations and discuss some helpful behaviors that can be of value to her and better managing her stress.  She does not have a lot of people to confide in so she values being able to come here and talk through "my worries and anxieties" and be able to see some different options in terms of managing them.  Is hoping to continue working for at least another year and wants to make the best of the  situation that she is in.  (Not all details included in this note due to patient privacy needs).  Does seem more resilient today and working towards "letting go" of some things that tend to hold her back, while focusing more on her positives and being able to move in a more positive direction. Encouraged patient in her practice of more positive behaviors including: Focus between sessions on our discussion in session about letting go of negativity and over worrying in order to see more positives and experience more hope, believing in herself more that she can make important changes that can influence her role within the family and at work, focus more on her own self care, staying in touch with people that are supportive, practice more consistent positive self talk, continue spending time outdoors in activities she enjoys especially biking, pursuing meaningful conversation with husband, take breaks as needed to experience more calmness for herself, intentionally looking for more positives each day, searching for the positives within herself and be able to name them, provide helpful feedback to husband regarding the positives when they occur in their relationship, maintain healthy boundaries with others as needed, reflect on any progress made daily, and recognize the strength she shows working with goal-directed behaviors to move in a direction that supports her improved emotional health. ? ?Goal review and progress/challenges noted with patient. ? ?Next appointment within 2 to 3 weeks. ? ?This record has been created using [employer].  Chart creation errors have been sought, but may not always have been located and corrected.  Such creation errors do not reflect on the standard of medical care provided. ? ? ?Shanon Ace, LCSW ? ? ? ? ? ? ? ? ? ? ? ? ? ? ? ? ? ? ?

## 2021-11-08 NOTE — Telephone Encounter (Signed)
Error

## 2021-11-19 NOTE — Progress Notes (Signed)
? ? [name] D.Merril Abbe ?[facility] ?[place] ?Phone: [phone] ?  ?Assessment and Plan:   ?  ?1. Neck pain ?2. DDD (degenerative disc disease), cervical ?-Chronic with exacerbation, initial sports medicine visit ?- Intermittent lower neck pain that can be severe at times (>6/10) that has not improved with conservative therapy including course of NSAIDs and home exercise program, and is occasionally accompanied by radicular symptoms of numbness and tingling into fingers, though not present on physical exam today ?- X-ray obtained in clinic.  My interpretation: No acute fracture or vertebral collapse.  Moderate to severe degenerative changes at C5-6 and C6-C7 ?- Based on information above, will further evaluate with C-spine MRI ?- May continue HEP for neck ?- Continue Tylenol/NSAIDs as needed for day-to-day pain relief ?  ?Pertinent previous records reviewed include none ?  ?Follow Up: I will call and contact patient once his C-spine MRI is performed and discuss results.  Patient is agreeable to epidural if it would be appropriate based on her MRI ?  ?Subjective:   ?I, [name], am serving as a Education administrator for [employer] ? ?Chief Complaint: neck pain  ? ?HPI:  ? ?11/20/2021 ?Patient is a 66 year old female complaining of neck pain. Patient states that the pain wakes her up in the middle of the night it feels like its crunching and she cant move it to the left some times she is an avid cyclist , not a constant pain bothers her most at night , states its arthritis she has it every where , has been taking ib and it doesn't touch the pain , does get  numbness and tingling going down has been going on for at least a couple of years ? ?Relevant Historical Information: Osteoarthritis of multiple joints, IBS, GERD, Raynaud's ? ?Additional pertinent review of systems negative. ? ? ?Current Outpatient Medications:  ?  ALPRAZolam (XANAX) 0.5 MG tablet,  TAKE 1 TABLET BY MOUTH 3 TIMES DAILY AS NEEDED FOR ANXIETY, Disp: 270 tablet, Rfl: 1 ?  atenolol (TENORMIN) 25 MG tablet, Take 1 tablet (25 mg total) by mouth daily., Disp: 90 tablet, Rfl: 1 ?  B-D 3CC LUER-LOK SYR 25GX1" 25G X 1" 3 ML MISC, USE WITH CYANOCOBALAMIN EVERY 14 DAYS, Disp: , Rfl:  ?  cholecalciferol (VITAMIN D) 1000 UNITS tablet, Take 1,000 Units by mouth every evening., Disp: , Rfl:  ?  clobetasol (TEMOVATE) 0.05 % external solution, APPLY TOPICALLY ONCE DAILY AS NEEDED FOR DERMATITIS, Disp: 50 mL, Rfl: 2 ?  cyanocobalamin (,VITAMIN B-12,) 1000 MCG/ML injection, INJECT 1 ML INTO THE MUSCLE EVERY 14 DAYS, Disp: 6 mL, Rfl: 0 ?  cyclobenzaprine (FLEXERIL) 10 MG tablet, Take 1/2 to 1 tablet by mouth every 8 to 12 hours as needed for acute muscle spasms, Disp: 30 tablet, Rfl: 2 ?  fluorometholone (FML) 0.1 % ophthalmic suspension, 1 drop 3 (three) times daily., Disp: , Rfl:  ?  FLUoxetine (PROZAC) 20 MG capsule, Take 1 capsule by mouth daily., Disp: 90 capsule, Rfl: 3 ?  fluticasone (FLONASE) 50 MCG/ACT nasal spray, Place 2 sprays into the nose daily., Disp: 16 g, Rfl: 5 ?  hydrocortisone (ANUSOL-HC) 2.5 % rectal cream, Place 1 application rectally 2 (two) times daily., Disp: 30 g, Rfl: 1 ?  lamoTRIgine (LAMICTAL) 200 MG tablet, Take 1 tablet (200 mg total) by mouth at bedtime., Disp: 90 tablet, Rfl: 3 ?  omeprazole (PRILOSEC) 40 MG capsule, Take 1 capsule (40  mg total) by mouth daily before breakfast. (Patient taking differently: Take 40 mg by mouth as needed.), Disp: 90 capsule, Rfl: 1 ?  sucralfate (CARAFATE) 1 GM/10ML suspension, TAKE 10 MLS BY MOUTH 3 TIMES DAILY AS NEEDED FOR DIGESTION, Disp: 840 mL, Rfl: 2 ?  Suvorexant (BELSOMRA) 10 MG TABS, Take 1 tablet by mouth at bedtime., Disp: 30 tablet, Rfl: 2 ?  SYRINGE-NEEDLE, DISP, 3 ML (B-D 3CC LUER-LOK SYR 25GX1") 25G X 1" 3 ML MISC, USE WITH CYANOCOBALAMIN EVERY 14 DAYS, Disp: 6 each, Rfl: 0 ?  SYRINGE-NEEDLE, DISP, 3 ML (B-D 3CC LUER-LOK SYR 25GX1")  25G X 1" 3 ML MISC, Use as directed to inject b-12 into the skin, Disp: 6 each, Rfl: 0  ? ?Objective:   ?  ?Vitals:  ? 11/20/21 0950  ?BP: 110/70  ?Pulse: (!) 55  ?SpO2: 97%  ?Weight: 114 lb (51.7 kg)  ?Height: '5\' 2"'$  (1.575 m)  ?  ?  ?Body mass index is 20.85 kg/m?.  ?  ?Physical Exam:   ? ?Cervical Spine: Posture normal ?Skin: normal, intact ? ?Neurological:  ? ?Strength: ? Right  Left   ?Deltoid 5/5 5/5  ?Bicep 5/5  5/5  ?Tricep 5/5 5/5  ?Wrist Flexion 5/5 5/5  ?Wrist Extension 5/5 5/5  ?Grip 5/5 5/5  ?Finger Abduction 5/5 5/5  ? ?Sensation: intact to light touch in upper extremities bilaterally ? ?Spurling's:  negative bilaterally ?Neck ROM: Decreased extension and flexion with rotation largely maintained ?TTP: Cervical paraspinal ?NTTP: cervical spinous processes,  thoracic paraspinal, trapezius  ? ? ?Electronically signed by:  ?[name] D.Merril Abbe ?[facility] ?11:50 AM 11/20/21 ?

## 2021-11-20 ENCOUNTER — Ambulatory Visit: Payer: 59 | Admitting: Sports Medicine

## 2021-11-20 ENCOUNTER — Ambulatory Visit (INDEPENDENT_AMBULATORY_CARE_PROVIDER_SITE_OTHER): Payer: 59

## 2021-11-20 VITALS — BP 110/70 | HR 55 | Ht 62.0 in | Wt 114.0 lb

## 2021-11-20 DIAGNOSIS — M5030 Other cervical disc degeneration, unspecified cervical region: Secondary | ICD-10-CM

## 2021-11-20 DIAGNOSIS — M50322 Other cervical disc degeneration at C5-C6 level: Secondary | ICD-10-CM | POA: Diagnosis not present

## 2021-11-20 DIAGNOSIS — M542 Cervicalgia: Secondary | ICD-10-CM | POA: Diagnosis not present

## 2021-11-20 DIAGNOSIS — M50323 Other cervical disc degeneration at C6-C7 level: Secondary | ICD-10-CM | POA: Diagnosis not present

## 2021-11-20 NOTE — Patient Instructions (Addendum)
Good to see you  ?Start tumeric over the counter  ?Can use tylenol and NSAIDS as needed ?C spine MRI referral  ?Neck HEP  ?Dr Glennon Mac will contact you with your MRI results ? ?

## 2021-11-23 ENCOUNTER — Other Ambulatory Visit: Payer: 59

## 2021-11-26 ENCOUNTER — Encounter: Payer: Self-pay | Admitting: Family Medicine

## 2021-11-26 ENCOUNTER — Ambulatory Visit: Payer: 59 | Admitting: Adult Health

## 2021-11-26 ENCOUNTER — Ambulatory Visit (INDEPENDENT_AMBULATORY_CARE_PROVIDER_SITE_OTHER): Payer: 59 | Admitting: Psychiatry

## 2021-11-26 DIAGNOSIS — F411 Generalized anxiety disorder: Secondary | ICD-10-CM | POA: Diagnosis not present

## 2021-11-26 DIAGNOSIS — Z01419 Encounter for gynecological examination (general) (routine) without abnormal findings: Secondary | ICD-10-CM | POA: Diagnosis not present

## 2021-11-26 DIAGNOSIS — E559 Vitamin D deficiency, unspecified: Secondary | ICD-10-CM | POA: Diagnosis not present

## 2021-11-26 DIAGNOSIS — Z6820 Body mass index (BMI) 20.0-20.9, adult: Secondary | ICD-10-CM | POA: Diagnosis not present

## 2021-11-26 DIAGNOSIS — N898 Other specified noninflammatory disorders of vagina: Secondary | ICD-10-CM | POA: Diagnosis not present

## 2021-11-26 NOTE — Progress Notes (Signed)
?    [facility] Counselor/Therapist Progress Note ? ?Patient ID: [name], MRN: [id number],   ? ?Date: 11/26/2021 ? ?Time Spent: 55 minutes  ? ?Treatment Type: Individual Therapy ? ?Reported Symptoms: anxiety, depression, "low tolerance with some coworkers" ? ?Mental Status Exam: ? ?Appearance:   Casual     ?Behavior:  Appropriate, Sharing, and Motivated  ?Motor:  Normal  ?Speech/Language:   Normal Rate  ?Affect:  Depressed and anxious  ?Mood:  anxious and depressed  ?Thought process:  goal directed  ?Thought content:    WNL  ?Sensory/Perceptual disturbances:    WNL  ?Orientation:  oriented to person, place, time/date, situation, day of week, month of year, year, and stated date of Nov 26, 2021  ?Attention:  Good  ?Concentration:  Good  ?Memory:  WNL  ?Fund of knowledge:   Good  ?Insight:    Good  ?Judgment:   Good  ?Impulse Control:  Good  ? ?Risk Assessment: ?Danger to Self:  No ?Self-injurious Behavior: No ?Danger to Others: No ?Duty to Warn:no ?Physical Aggression / Violence:No  ?Access to Firearms a concern: No  ?Gang Involvement:No  ? ?Subjective:  Patient in today reporting anxiety and depression, and "low tolerance of some of my coworkers." Today "needing to talk about something that happened and brought up some of my past that has been hurtful." Discussed this incident and was able to eventually work towards "letting go" of it in order to move forward, and realizes it will be a process.  Also wanted to talk through some issues at work involving different personalities which are difficult for patient to accept. Husband needing MRI and possible surgery, uncertain but stressful. Mom's cognitive issues and getting worse. Communicating better with husband. Continues work on her habit of worrying and states today that she is making some progress "as I can go to sleep and stay asleep better. Also making "a little" progress in trying to see more positives than negatives, and does "recall our conversations  about how negatives feed my depression".  ? ?Interventions: Solution-Oriented/Positive Psychology and Insight-Oriented ? ?Treatment goal plan:  ?Patient not signing the tx plan on computer screen due to [place] ?Treatment Goals: ?Goals remain on treatment plan as patient works on strategies to meet her goals.  Progress will be noted each session and documented in "Progress" section of treatment plan. ?Long term goal: ?Develop healthy cognitive patterns and beliefs about self and the world that lead to alleviation of depression and anxiety and help prevent relapse.  ?Progressing: ?Patient is motivated. Struggling to focus some due to immediate, unexpected health concern with husband.  ?Short term goal: ?Learn and implement personal skills for managing stress, solving daily problems, and resolving conflicts effectively. ?Strategy: ?Teach patient calming skills, problem solving skills, and conflict resolution skills, to better manage daily stressors. ?  ?Diagnosis: ?  ICD-10-CM   ?1. Generalized anxiety disorder  F41.1   ?  ? ?Plan:  Patient today showing good motivation and participation in session as she worked on her anxiety, depression, and some frustration on her job and a relationship with her husband.  Did a really good job and working through some feelings that she had been holding in including anger, frustration, and resentment and was able to talk through these in order to begin the process of letting them go.  Plans to follow up on some recommended behaviors in relation to her job, her husband, and in taking care of herself emotionally. Encouraged patient to be more focused in  her practice of positive behaviors including: Work on letting go of negativity and over-worrying in order to see more positives and experience more hope, believing in herself more that she can make important changes that can positively influence her role within the family and at work, focus more on her own self care, staying in touch  with people that are supportive, practice more consistent positive self talk, continue spending time outdoors in activities she is enjoying especially biking, pursuing meaningful conversation with husband, take breaks as needed to experience more calmness for herself, intentionally look for more positives each day, search for the positives within herself and be able to name them, provide helpful feedback to husband regarding the positives when they occur in their relationship, maintain healthy boundaries with others as needed, reflect on her progress more often, and realize the strength she shows working with goal directed behaviors to move in a direction that supports her improved emotional health and overall outlook. ? ?Goal review and progress/challenges noted with patient. ? ?Next appointment within 3 weeks. ? ?This record has been created using [employer].  Chart creation errors have been sought, but may not always have been located and corrected.  Such creation errors do not reflect on the standard of medical care provided. ? ? ?Shanon Ace, LCSW ? ? ? ? ? ? ? ? ? ? ? ? ? ? ? ? ? ? ?

## 2021-12-05 ENCOUNTER — Other Ambulatory Visit (HOSPITAL_COMMUNITY): Payer: Self-pay

## 2021-12-05 ENCOUNTER — Telehealth: Payer: Self-pay | Admitting: Internal Medicine

## 2021-12-05 MED ORDER — HYDROCORTISONE (PERIANAL) 2.5 % EX CREA
1.0000 "application " | TOPICAL_CREAM | Freq: Two times a day (BID) | CUTANEOUS | 1 refills | Status: DC
  Filled 2021-12-05: qty 30, 10d supply, fill #0

## 2021-12-05 NOTE — Telephone Encounter (Signed)
Hemorrhoids flaring Needs refill of cream

## 2021-12-06 ENCOUNTER — Other Ambulatory Visit (HOSPITAL_COMMUNITY): Payer: Self-pay

## 2021-12-11 ENCOUNTER — Ambulatory Visit: Payer: 59 | Admitting: Adult Health

## 2021-12-16 ENCOUNTER — Ambulatory Visit (INDEPENDENT_AMBULATORY_CARE_PROVIDER_SITE_OTHER): Payer: 59

## 2021-12-16 DIAGNOSIS — M542 Cervicalgia: Secondary | ICD-10-CM | POA: Diagnosis not present

## 2021-12-16 DIAGNOSIS — G8929 Other chronic pain: Secondary | ICD-10-CM | POA: Diagnosis not present

## 2021-12-16 DIAGNOSIS — M4802 Spinal stenosis, cervical region: Secondary | ICD-10-CM | POA: Diagnosis not present

## 2021-12-18 NOTE — Progress Notes (Signed)
Benito Mccreedy D.[facility] Phone: [phone]   Assessment and Plan:     1. Neck pain 2. DDD (degenerative disc disease), cervical 3. Spinal stenosis of cervical region -Chronic with exacerbation, subsequent visit - Continued neck pain with worsening spasm over the past week - Reviewed MRI with patient in room with most significant finding of moderate to severe foraminal stenosis from C4-T1, but most significant at C5-C6 - Patient elects to proceed with right-sided C5-C6 epidural and will follow-up 2 weeks after to review effectiveness - Continue HEP for neck, Tylenol/NSAIDs as needed for day-to-day pain relief   Pertinent previous records reviewed include MRI C-spine 12/16/2021   Follow Up: 2 weeks after epidural injection to review its effectiveness and discuss further treatment plan   Subjective:   I, [name], am serving as a Education administrator for Doctor Glennon Mac   Chief Complaint: neck pain    HPI:    11/20/2021 Patient is a 66 year old female complaining of neck pain. Patient states that the pain wakes her up in the middle of the night it feels like its crunching and she cant move it to the left some times she is an avid cyclist , not a constant pain bothers her most at night , states its arthritis she has it every where , has been taking ib and it doesn't touch the pain , does get  numbness and tingling going down has been going on for at least a couple of years   12/19/2021 Patient states that her neck is sore had a bad muscle spasm that went from her shoulder to her ear to the side of her head she had to call out of work it came out of no where    Relevant Historical Information: Osteoarthritis of multiple joints, IBS, GERD, Raynaud's  Additional pertinent review of systems negative.   Current Outpatient Medications:    ALPRAZolam (XANAX) 0.5 MG tablet, TAKE 1 TABLET BY MOUTH 3 TIMES DAILY AS  NEEDED FOR ANXIETY, Disp: 270 tablet, Rfl: 1   atenolol (TENORMIN) 25 MG tablet, Take 1 tablet (25 mg total) by mouth daily., Disp: 90 tablet, Rfl: 1   B-D 3CC LUER-LOK SYR 25GX1" 25G X 1" 3 ML MISC, USE WITH CYANOCOBALAMIN EVERY 14 DAYS, Disp: , Rfl:    cholecalciferol (VITAMIN D) 1000 UNITS tablet, Take 1,000 Units by mouth every evening., Disp: , Rfl:    clobetasol (TEMOVATE) 0.05 % external solution, APPLY TOPICALLY ONCE DAILY AS NEEDED FOR DERMATITIS, Disp: 50 mL, Rfl: 2   cyanocobalamin (,VITAMIN B-12,) 1000 MCG/ML injection, INJECT 1 ML INTO THE MUSCLE EVERY 14 DAYS, Disp: 6 mL, Rfl: 0   FLUoxetine (PROZAC) 20 MG capsule, Take 1 capsule by mouth daily., Disp: 90 capsule, Rfl: 3   fluticasone (FLONASE) 50 MCG/ACT nasal spray, Place 2 sprays into the nose daily., Disp: 16 g, Rfl: 5   hydrocortisone (ANUSOL-HC) 2.5 % rectal cream, Insert rectally 2 times daily for 10 days, Disp: 30 g, Rfl: 1   lamoTRIgine (LAMICTAL) 200 MG tablet, Take 1 tablet (200 mg total) by mouth at bedtime., Disp: 90 tablet, Rfl: 3   omeprazole (PRILOSEC) 40 MG capsule, Take 1 capsule (40 mg total) by mouth daily before breakfast. (Patient taking differently: Take 40 mg by mouth as needed.), Disp: 90 capsule, Rfl: 1   sucralfate (CARAFATE) 1 GM/10ML suspension, TAKE 10 MLS BY MOUTH 3 TIMES DAILY AS NEEDED FOR DIGESTION, Disp: 840  mL, Rfl: 2   SYRINGE-NEEDLE, DISP, 3 ML (B-D 3CC LUER-LOK SYR 25GX1") 25G X 1" 3 ML MISC, USE WITH CYANOCOBALAMIN EVERY 14 DAYS, Disp: 6 each, Rfl: 0   SYRINGE-NEEDLE, DISP, 3 ML (B-D 3CC LUER-LOK SYR 25GX1") 25G X 1" 3 ML MISC, Use as directed to inject b-12 into the skin, Disp: 6 each, Rfl: 0   cyclobenzaprine (FLEXERIL) 10 MG tablet, Take 1/2 to 1 tablet by mouth every 8 to 12 hours as needed for acute muscle spasms, Disp: 30 tablet, Rfl: 2   fluorometholone (FML) 0.1 % ophthalmic suspension, 1 drop 3 (three) times daily., Disp: , Rfl:    Suvorexant (BELSOMRA) 10 MG TABS, Take 1 tablet by mouth  at bedtime., Disp: 30 tablet, Rfl: 2   Objective:     Vitals:   12/19/21 1433  BP: 118/78  Pulse: 69  SpO2: 98%  Weight: 106 lb (48.1 kg)  Height: '5\' 2"'$  (1.575 m)      Body mass index is 19.39 kg/m.    Physical Exam:     Cervical Spine: Posture normal Skin: normal, intact   Neurological:    Strength:     Right  Left  Deltoid   5/5 5/5 Bicep   5/5  5/5 Tricep   5/5 5/5 Wrist Flexion  5/5 5/5 Wrist Extension 5/5 5/5 Grip   5/5 5/5 Finger Abduction 5/5 5/5   Sensation: intact to light touch in upper extremities bilaterally   Spurling's:  negative bilaterally Neck ROM: Decreased extension and flexion with rotation largely maintained TTP: Cervical paraspinal, worse on the right, trapezius NTTP: cervical spinous processes,  thoracic paraspinal,          Electronically signed by:  Benito Mccreedy D.[facility] 2:53 PM 12/19/21

## 2021-12-19 ENCOUNTER — Ambulatory Visit (INDEPENDENT_AMBULATORY_CARE_PROVIDER_SITE_OTHER): Payer: 59 | Admitting: Sports Medicine

## 2021-12-19 ENCOUNTER — Ambulatory Visit: Payer: 59 | Admitting: Psychiatry

## 2021-12-19 VITALS — BP 118/78 | HR 69 | Ht 62.0 in | Wt 106.0 lb

## 2021-12-19 DIAGNOSIS — M5030 Other cervical disc degeneration, unspecified cervical region: Secondary | ICD-10-CM | POA: Diagnosis not present

## 2021-12-19 DIAGNOSIS — M4802 Spinal stenosis, cervical region: Secondary | ICD-10-CM | POA: Diagnosis not present

## 2021-12-19 DIAGNOSIS — M542 Cervicalgia: Secondary | ICD-10-CM | POA: Diagnosis not present

## 2021-12-19 NOTE — Patient Instructions (Addendum)
Good to see you  Epidural referral c5-c6 right  Follow up 2 weeks after your epidural to discuss results

## 2021-12-24 ENCOUNTER — Other Ambulatory Visit (HOSPITAL_COMMUNITY): Payer: Self-pay

## 2021-12-24 ENCOUNTER — Other Ambulatory Visit: Payer: Self-pay

## 2021-12-24 ENCOUNTER — Other Ambulatory Visit: Payer: Self-pay | Admitting: Family Medicine

## 2021-12-24 ENCOUNTER — Telehealth: Payer: Self-pay

## 2021-12-24 MED ORDER — ATENOLOL 25 MG PO TABS
25.0000 mg | ORAL_TABLET | Freq: Every day | ORAL | 0 refills | Status: DC
  Filled 2021-12-24: qty 30, 30d supply, fill #0

## 2021-12-24 NOTE — Telephone Encounter (Addendum)
Pt called stating that she is need of atenolol and b12. Advised pt that she needs to schedule an appt. Pt will call to schedule. 30 d/s of atenolol sent to pharmacy

## 2021-12-27 NOTE — Progress Notes (Unsigned)
[place] Phone: [phone] Subjective:   [name], am serving as a scribe for Dr. Hulan Saas.   I'm seeing this patient by the request  of:  Kuneff, Renee A, DO  CC: neck pain thumb pain   ASN:[id number]  Last seen by Dr. Glennon Mac for neck pain on 12/19/2021.   BRIUNNA LEICHT is a 66 y.o. female coming in with complaint of right thumb pain. Here for PRP. Felt like PRP was helpful for 6 months. Would like to put off surgery. Is worried about future for health of this joint. Also is worried about positive lab that indicated that she could develop ankylosing spondylitis.        Past Medical History:  Diagnosis Date   Allergy    Anemia    after birth of child 16yr ago   Anxiety and depression    with insomnia   Blood transfusion without reported diagnosis    x3   Chronic back pain    ? RA, ? Lupus - reports saw rheumatologist in the past but better now, sees Dr. PTrenton Gammon  Eczema    GERD (gastroesophageal reflux disease)    takes Omeprazole daily, with nausea   Heart murmur    Hemorrhoids    History of kidney stones    passed on her own   History of migraine    last one a yr ago and takes Zomig prn   Hot flashes 11/10/2014   HTN (hypertension) 03/31/2012   Hx of echocardiogram    Echo (8/15):  EF 55-60%, no RWMA   IBS (irritable bowel syndrome)    constipation predominant   Interstitial cystitis    hx of UTI   Numbness    both legs and related to back   Osteoporosis    osteopenia   Palpitations    on metoprolol in past but made BP too low   Pneumonia    hx of;last time 89yrago   PONV (postoperative nausea and vomiting)    laryngospasm-1999   Poor vision 10/15/2015   -? Ocular rosacea -sees opthomology    Raynaud's disease /phenomenon 10/21/2012   no meds, worse in the winter or cold environment.    Rheumatoid aortitis    uses Diclofenac gel;Rheumatoid   Seasonal allergies     Flonase daily    Urinary incontinence    Past Surgical History:  Procedure Laterality Date   ABDOMINAL HYSTERECTOMY  1997   BREAST BIOPSY  1999   benign   COLONOSCOPY  02/11/2013 and 12/24/04   internal and external hemorrhoids   ENDOMETRIAL ABLATION     EXCISION MORTON'S NEUROMA Right    FLEXIBLE SIGMOIDOSCOPY  03/07/2008   internal and external hemorrhoids   POSTERIOR LUMBAR FUSION  05/02/2013   L4-L5 fusion (cage/screws/bone graft) Dr. PoAnnette Stable TONSILLECTOMY     UPPER GASTROINTESTINAL ENDOSCOPY  10/23/2010   gastritis, irregular Z-line   wisdom teeth extracted     Social History   Socioeconomic History   Marital status: Married    Spouse name: Not on file   Number of children: Not on file   Years of education: Not on file   Highest education level: Not on file  Occupational History   Occupation: RNTherapist, sports  Employer:   Tobacco Use   Smoking status: Never   Smokeless tobacco: Never  Vaping Use   Vaping Use: Never used  Substance and Sexual  Activity   Alcohol use: Yes    Alcohol/week: 2.0 standard drinks of alcohol    Types: 1 Shots of liquor, 1 Standard drinks or equivalent per week    Comment: one margarita a week   Drug use: No   Sexual activity: Not Currently    Partners: Male  Other Topics Concern   Not on file  Social History Narrative   Spiritual Beliefs: methodist   Marital status/children/pets: In second marriage.  First husband committed suicide.  2 children.   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   Safety:      -Wears a bicycle helmet riding a bike: Yes     -smoke alarm in the home:No     - wears seatbelt: Yes     - Feels safe in their relationships: Yes            Social Determinants of Health   Financial Resource Strain: Not on file  Food Insecurity: Not on file  Transportation Needs: Not on file  Physical Activity: Not on file  Stress: Not on file  Social Connections: Not on file   Allergies  Allergen Reactions    Aspartame And Phenylalanine Other (See Comments)    Migraines   Beef-Derived Products Other (See Comments)    severe cramping   Citrus Other (See Comments)    Causes Interstitial cystitis flares   Penicillins Hives and Rash    Denies airway involvement Has patient had a PCN reaction causing immediate rash, facial/tongue/throat swelling, SOB or lightheadedness with hypotension: yes hives.  Has patient had a PCN reaction causing severe rash involving mucus membranes or skin necrosis:no Has patient had a PCN reaction that required hospitalization No Has patient had a PCN reaction occurring within the last 10 years: No If all of the above answers are "NO", then may proceed with Cephalosporin use.    Promethazine Hcl Other (See Comments)    Muscle twitching and contracture    Adhesive [Tape]     Red splotches   Beta Vulgaris    Promethazine    Doxycycline Nausea And Vomiting   Morphine Sulfate Nausea And Vomiting   Family History  Problem Relation Age of Onset   Coronary artery disease Father    Heart attack Father    Hypertension Father    Depression Father    Hyperlipidemia Father    Alcohol abuse Father    Early death Father    Kidney disease Father    Mental illness Father    Diverticulosis Mother    Hypertension Mother    Osteoarthritis Mother    Depression Mother    Hyperlipidemia Mother    Kidney disease Mother    Alcohol abuse Sister    Depression Sister    Diabetes Paternal Uncle    Hypertension Maternal Grandmother    Stroke Maternal Grandmother    Hypertension Maternal Grandfather    Pancreatic cancer Maternal Grandfather    Early death Maternal Grandfather    Stroke Paternal Grandfather    Hyperlipidemia Paternal Grandfather    Alcohol abuse Paternal Grandfather    Early death Paternal Grandfather    Hypertension Paternal Grandfather    Depression Paternal Grandmother    Dementia Paternal Grandmother    Arthritis Paternal Grandmother    Mental illness  Paternal Grandmother    Alcohol abuse Daughter    Depression Daughter    Drug abuse Daughter    Alcohol abuse Son    Depression Son    Colon cancer Neg Hx  Current Outpatient Medications (Cardiovascular):    atenolol (TENORMIN) 25 MG tablet, Take 1 tablet (25 mg total) by mouth daily.  Current Outpatient Medications (Respiratory):    fluticasone (FLONASE) 50 MCG/ACT nasal spray, Place 2 sprays into the nose daily.   Current Outpatient Medications (Hematological):    cyanocobalamin (,VITAMIN B-12,) 1000 MCG/ML injection, INJECT 1 ML INTO THE MUSCLE EVERY 14 DAYS  Current Outpatient Medications (Other):    ALPRAZolam (XANAX) 0.5 MG tablet, TAKE 1 TABLET BY MOUTH 3 TIMES DAILY AS NEEDED FOR ANXIETY   B-D 3CC LUER-LOK SYR 25GX1" 25G X 1" 3 ML MISC, USE WITH CYANOCOBALAMIN EVERY 14 DAYS   cholecalciferol (VITAMIN D) 1000 UNITS tablet, Take 1,000 Units by mouth every evening.   clobetasol (TEMOVATE) 0.05 % external solution, APPLY TOPICALLY ONCE DAILY AS NEEDED FOR DERMATITIS   FLUoxetine (PROZAC) 20 MG capsule, Take 1 capsule by mouth daily.   hydrocortisone (ANUSOL-HC) 2.5 % rectal cream, Insert rectally 2 times daily for 10 days   lamoTRIgine (LAMICTAL) 200 MG tablet, Take 1 tablet (200 mg total) by mouth at bedtime.   omeprazole (PRILOSEC) 40 MG capsule, Take 1 capsule (40 mg total) by mouth daily before breakfast. (Patient taking differently: Take 40 mg by mouth as needed.)   sucralfate (CARAFATE) 1 GM/10ML suspension, TAKE 10 MLS BY MOUTH 3 TIMES DAILY AS NEEDED FOR DIGESTION   SYRINGE-NEEDLE, DISP, 3 ML (B-D 3CC LUER-LOK SYR 25GX1") 25G X 1" 3 ML MISC, USE WITH CYANOCOBALAMIN EVERY 14 DAYS   SYRINGE-NEEDLE, DISP, 3 ML (B-D 3CC LUER-LOK SYR 25GX1") 25G X 1" 3 ML MISC, Use as directed to inject b-12 into the skin   cyclobenzaprine (FLEXERIL) 10 MG tablet, Take 1/2 to 1 tablet by mouth every 8 to 12 hours as needed for acute muscle spasms   fluorometholone (FML) 0.1 % ophthalmic  suspension, 1 drop 3 (three) times daily.   Suvorexant (BELSOMRA) 10 MG TABS, Take 1 tablet by mouth at bedtime.   Reviewed prior external information including notes and imaging from  primary care provider As well as notes that were available from care everywhere and other healthcare systems.  Past medical history, social, surgical and family history all reviewed in electronic medical record.  No pertanent information unless stated regarding to the chief complaint.   Review of Systems:  No headache, visual changes, nausea, vomiting, diarrhea, constipation, dizziness, abdominal pain, skin rash, fevers, chills, night sweats, weight loss, swollen lymph nodes, body aches, joint swelling, chest pain, shortness of breath, mood changes. POSITIVE muscle aches  Objective  Blood pressure 112/82, pulse 82, height '5\' 2"'$  (1.575 m), weight 114 lb (51.7 kg), SpO2 98 %.   General: No apparent distress alert and oriented x3 mood and affect normal, dressed appropriately.  HEENT: Pupils equal, extraocular movements intact  Patient does have fairly significant swelling noted there is some loosening compared to the contralateral side.  Positive grind test noted.  Procedure: Real-time Ultrasound Guided Injection of right CMC joint Device: GE Logiq Q7 Ultrasound guided injection is preferred based studies that show increased duration, increased effect, greater accuracy, decreased procedural pain, increased response rate, and decreased cost with ultrasound guided versus blind injection.  Verbal informed consent obtained.  Time-out conducted.  Noted no overlying erythema, induration, or other signs of local infection.  Skin prepped in a sterile fashion.  Local anesthesia: Topical Ethyl chloride.  With sterile technique and under real time ultrasound guidance: With a 21-gauge 1 inch needle patient was injected with 0.5 cc of  0.5% Marcaine injected with 2 cc Completed without difficulty  Pain immediately  improved suggesting accurate placement of the medication.  Advised to call if fevers/chills, erythema, induration, drainage, or persistent bleeding.  Impression: Technically successful ultrasound guided injection.    Impression and Recommendations:    The above documentation has been reviewed and is accurate and complete Lyndal Pulley, DO

## 2021-12-30 ENCOUNTER — Ambulatory Visit: Payer: Self-pay

## 2021-12-30 ENCOUNTER — Encounter: Payer: Self-pay | Admitting: Family Medicine

## 2021-12-30 ENCOUNTER — Ambulatory Visit (INDEPENDENT_AMBULATORY_CARE_PROVIDER_SITE_OTHER): Payer: Self-pay | Admitting: Family Medicine

## 2021-12-30 VITALS — BP 112/82 | HR 82 | Ht 62.0 in | Wt 114.0 lb

## 2021-12-30 DIAGNOSIS — M79644 Pain in right finger(s): Secondary | ICD-10-CM

## 2021-12-30 NOTE — Patient Instructions (Signed)
No ice or Ibu for 3 day Heat and Tylenol are ok See me again in 6-8 weeks

## 2022-01-01 ENCOUNTER — Ambulatory Visit: Payer: 59 | Admitting: Family Medicine

## 2022-01-01 ENCOUNTER — Ambulatory Visit: Payer: 59 | Admitting: Psychiatry

## 2022-01-01 ENCOUNTER — Encounter: Payer: Self-pay | Admitting: Family Medicine

## 2022-01-01 ENCOUNTER — Other Ambulatory Visit (HOSPITAL_COMMUNITY): Payer: Self-pay

## 2022-01-01 VITALS — BP 134/80 | HR 99 | Temp 98.3°F | Ht 62.0 in | Wt 114.0 lb

## 2022-01-01 DIAGNOSIS — R002 Palpitations: Secondary | ICD-10-CM | POA: Diagnosis not present

## 2022-01-01 DIAGNOSIS — E538 Deficiency of other specified B group vitamins: Secondary | ICD-10-CM

## 2022-01-01 DIAGNOSIS — F411 Generalized anxiety disorder: Secondary | ICD-10-CM | POA: Diagnosis not present

## 2022-01-01 MED ORDER — "BD LUER-LOK SYRINGE 25G X 1"" 3 ML MISC"
3 refills | Status: DC
  Filled 2022-01-01: qty 6, 84d supply, fill #0
  Filled 2022-01-20: qty 6, 84d supply, fill #1
  Filled 2022-05-19: qty 6, 84d supply, fill #2

## 2022-01-01 MED ORDER — ATENOLOL 25 MG PO TABS
25.0000 mg | ORAL_TABLET | Freq: Every day | ORAL | 3 refills | Status: DC
  Filled 2022-01-01 – 2022-01-06 (×3): qty 90, 90d supply, fill #0
  Filled 2022-04-21: qty 90, 90d supply, fill #1

## 2022-01-01 MED ORDER — CYANOCOBALAMIN 1000 MCG/ML IJ SOLN
INTRAMUSCULAR | 3 refills | Status: DC
  Filled 2022-01-01: qty 6, 84d supply, fill #0
  Filled 2022-01-20 – 2022-05-19 (×2): qty 6, 84d supply, fill #1

## 2022-01-01 NOTE — Progress Notes (Signed)
Patient ID: [name], female  DOB: 1955-08-02, 66 y.o.   MRN: [id number] Patient Care Team    Relationship Specialty Notifications Start End  Ma Hillock, DO PCP - General Family Medicine  12/29/18   Gavin Pound, MD Consulting Physician Rheumatology  04/09/17   Maisie Fus, MD Consulting Physician Obstetrics and Gynecology  04/09/17   Devra Dopp, MD Referring Physician Dermatology  04/09/17   Group, [place]  12/29/18    Comment: [facility]    12/29/18   [name], MD Consulting Physician Gastroenterology  12/29/18   Earnie Larsson, MD Consulting Physician Neurosurgery  12/29/18     Chief Complaint  Patient presents with   Anxiety    CMC; pt is not fasting     Subjective: [name] is a 66 y.o.  Female  present for [facility] Palpitation/anxiety: Patient reports she has continued take her atenolol 25 mg daily and her palpitations are well controlled.  B12 deficiency: Patient has continued her B12 injections at home every 14 days.  She is in need refills today.  B12 levels have maintained around 500 with q. 14-day injection.     01/01/2022    2:16 PM 05/27/2021   10:37 AM 03/29/2021   11:34 AM 05/23/2020    9:40 AM 04/27/2019    9:38 AM  Depression screen PHQ 2/9  Decreased Interest 0 0 0 0 0  Down, Depressed, Hopeless 0 0 0 0 0  PHQ - 2 Score 0 0 0 0 0  Altered sleeping 1  0    Tired, decreased energy 0  0    Change in appetite 0  0    Feeling bad or failure about yourself  0  0    Trouble concentrating 0  0    Moving slowly or fidgety/restless 0  0    Suicidal thoughts 0  0    PHQ-9 Score 1  0    Difficult doing work/chores   Not difficult at all        01/01/2022    2:17 PM 04/27/2019    9:39 AM 12/29/2018    1:56 PM  GAD 7 : Generalized Anxiety Score  Nervous, Anxious, on Edge '2 1 3  '$ Control/stop worrying '1 2 3  '$ Worry too much - different things '1 2 3  '$ Trouble  relaxing '1 1 2  '$ Restless '1 1 2  '$ Easily annoyed or irritable '1 2 3  '$ Afraid - awful might happen 0 2 2  Total GAD 7 Score '7 11 18  '$ Anxiety Difficulty  Somewhat difficult Extremely difficult     Immunization History  Administered Date(s) Administered   Influenza,inj,Quad PF,6+ Mos 04/12/2014   Influenza-Unspecified 05/15/2015, 03/28/2018, 04/20/2020, 04/13/2021   PFIZER(Purple Top)SARS-COV-2 Vaccination 08/04/2019, 08/23/2019   Td 07/15/2003   Tdap 01/11/2021   Zoster Recombinat (Shingrix) 04/27/2019, 09/26/2019   Past Medical History:  Diagnosis Date   Allergy    Anemia    after birth of child 21yr ago   Anxiety and depression    with insomnia   Blood transfusion without reported diagnosis    x3   Chronic back pain    ? RA, ? Lupus - reports saw rheumatologist in the past but better now, sees Dr. PTrenton Gammon  Eczema    Flushing 05/27/2021   GERD 02/13/2007   Qualifier: Diagnosis of  By: JJenny ReichmannMD, JHunt Oris   GERD (  gastroesophageal reflux disease)    takes Omeprazole daily, with nausea   Greater trochanteric bursitis of left hip 03/25/2019   Injected March 25, 2019   Heart murmur    Hemorrhoids    History of kidney stones    passed on her own   History of migraine    last one a yr ago and takes Zomig prn   Hot flashes 11/10/2014   HTN (hypertension) 03/31/2012   Hx of echocardiogram    Echo (8/15):  EF 55-60%, no RWMA   IBS (irritable bowel syndrome)    constipation predominant   Interstitial cystitis    hx of UTI   Irritable bowel syndrome 12/14/2009   Qualifier: Diagnosis of  By: Carlean Purl MD, Dimas Millin    Memory loss 12/22/2016   Numbness    both legs and related to back   Osteoporosis    osteopenia   Palpitations    on metoprolol in past but made BP too low   Pneumonia    hx of;last time 90yr ago   PONV (postoperative nausea and vomiting)    laryngospasm-1999   Poor vision 10/15/2015   -? Ocular rosacea -sees opthomology    Raynaud's disease /phenomenon  10/21/2012   no meds, worse in the winter or cold environment.    Rheumatoid aortitis    uses Diclofenac gel;Rheumatoid   Seasonal allergies    Flonase daily    Strain of left piriformis muscle 05/18/2019   Urinary incontinence    Allergies  Allergen Reactions   Aspartame And Phenylalanine Other (See Comments)    Migraines   Beef-Derived Products Other (See Comments)    severe cramping   Citrus Other (See Comments)    Causes Interstitial cystitis flares   Penicillins Hives and Rash    Denies airway involvement Has patient had a PCN reaction causing immediate rash, facial/tongue/throat swelling, SOB or lightheadedness with hypotension: yes hives.  Has patient had a PCN reaction causing severe rash involving mucus membranes or skin necrosis:no Has patient had a PCN reaction that required hospitalization No Has patient had a PCN reaction occurring within the last 10 years: No If all of the above answers are "NO", then may proceed with Cephalosporin use.    Promethazine Hcl Other (See Comments)    Muscle twitching and contracture    Adhesive [Tape]     Red splotches   Beta Vulgaris    Promethazine    Doxycycline Nausea And Vomiting   Morphine Sulfate Nausea And Vomiting   Past Surgical History:  Procedure Laterality Date   ABDOMINAL HYSTERECTOMY  1997   BREAST BIOPSY  1999   benign   COLONOSCOPY  02/11/2013 and 12/24/04   internal and external hemorrhoids   ENDOMETRIAL ABLATION     EXCISION MORTON'S NEUROMA Right    FLEXIBLE SIGMOIDOSCOPY  03/07/2008   internal and external hemorrhoids   POSTERIOR LUMBAR FUSION  05/02/2013   L4-L5 fusion (cage/screws/bone graft) Dr. PAnnette Stable  TONSILLECTOMY     UPPER GASTROINTESTINAL ENDOSCOPY  10/23/2010   gastritis, irregular Z-line   wisdom teeth extracted     Family History  Problem Relation Age of Onset   Coronary artery disease Father    Heart attack Father    Hypertension Father    Depression Father    Hyperlipidemia Father     Alcohol abuse Father    Early death Father    Kidney disease Father    Mental illness Father    Diverticulosis Mother  Hypertension Mother    Osteoarthritis Mother    Depression Mother    Hyperlipidemia Mother    Kidney disease Mother    Alcohol abuse Sister    Depression Sister    Diabetes Paternal Uncle    Hypertension Maternal Grandmother    Stroke Maternal Grandmother    Hypertension Maternal Grandfather    Pancreatic cancer Maternal Grandfather    Early death Maternal Grandfather    Stroke Paternal Grandfather    Hyperlipidemia Paternal Grandfather    Alcohol abuse Paternal Grandfather    Early death Paternal Grandfather    Hypertension Paternal Grandfather    Depression Paternal Grandmother    Dementia Paternal Grandmother    Arthritis Paternal Grandmother    Mental illness Paternal Grandmother    Alcohol abuse Daughter    Depression Daughter    Drug abuse Daughter    Alcohol abuse Son    Depression Son    Colon cancer Neg Hx    Social History   Social History Narrative   Spiritual Beliefs: methodist   Marital status/children/pets: In second marriage.  First husband committed suicide.  2 children.   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   Safety:      -Wears a bicycle helmet riding a bike: Yes     -smoke alarm in the home:No     - wears seatbelt: Yes     - Feels safe in their relationships: Yes             Allergies as of 01/01/2022       Reactions   Aspartame And Phenylalanine Other (See Comments)   Migraines   Beef-derived Products Other (See Comments)   severe cramping   Citrus Other (See Comments)   Causes Interstitial cystitis flares   Penicillins Hives, Rash   Denies airway involvement Has patient had a PCN reaction causing immediate rash, facial/tongue/throat swelling, SOB or lightheadedness with hypotension: yes hives.  Has patient had a PCN reaction causing severe rash involving mucus membranes or skin necrosis:no Has  patient had a PCN reaction that required hospitalization No Has patient had a PCN reaction occurring within the last 10 years: No If all of the above answers are "NO", then may proceed with Cephalosporin use.   Promethazine Hcl Other (See Comments)   Muscle twitching and contracture    Adhesive [tape]    Red splotches   Beta Vulgaris    Promethazine    Doxycycline Nausea And Vomiting   Morphine Sulfate Nausea And Vomiting        Medication List        Accurate as of January 01, 2022  5:05 PM. If you have any questions, ask your nurse or doctor.          STOP taking these medications    Belsomra 10 MG Tabs Generic drug: Suvorexant Stopped by: Howard Pouch, DO   cyclobenzaprine 10 MG tablet Commonly known as: FLEXERIL Stopped by: Howard Pouch, DO   fluorometholone 0.1 % ophthalmic suspension Commonly known as: FML Stopped by: Howard Pouch, DO   omeprazole 40 MG capsule Commonly known as: PRILOSEC Stopped by: Howard Pouch, DO   sucralfate 1 GM/10ML suspension Commonly known as: Carafate Stopped by: Howard Pouch, DO       TAKE these medications    ALPRAZolam 0.5 MG tablet Commonly known as: XANAX TAKE 1 TABLET BY MOUTH 3 TIMES DAILY AS NEEDED FOR ANXIETY   atenolol 25 MG tablet Commonly known as: TENORMIN Take 1 tablet (25  mg total) by mouth daily.   B-D 3CC LUER-LOK SYR 25GX1" 25G X 1" 3 ML Misc Generic drug: SYRINGE-NEEDLE (DISP) 3 ML USE WITH CYANOCOBALAMIN EVERY 14 DAYS What changed: Another medication with the same name was removed. Continue taking this medication, and follow the directions you see here. Changed by: Howard Pouch, DO   B-D 3CC LUER-LOK SYR 25GX1" 25G X 1" 3 ML Misc Generic drug: SYRINGE-NEEDLE (DISP) 3 ML Use as directed to inject b-12 into the skin What changed: Another medication with the same name was removed. Continue taking this medication, and follow the directions you see here. Changed by: Howard Pouch, DO   cholecalciferol  1000 units tablet Commonly known as: VITAMIN D Take 1,000 Units by mouth every evening.   clobetasol 0.05 % external solution Commonly known as: TEMOVATE APPLY TOPICALLY ONCE DAILY AS NEEDED FOR DERMATITIS   Dodex 1000 MCG/ML injection Generic drug: cyanocobalamin INJECT 1 ML INTO THE MUSCLE EVERY 14 DAYS   FLUoxetine 20 MG capsule Commonly known as: PROzac Take 1 capsule by mouth daily.   fluticasone 50 MCG/ACT nasal spray Commonly known as: FLONASE Place 2 sprays into the nose daily.   lamoTRIgine 200 MG tablet Commonly known as: LAMICTAL Take 1 tablet (200 mg total) by mouth at bedtime.   Proctozone-HC 2.5 % rectal cream Generic drug: hydrocortisone Insert rectally 2 times daily for 10 days        All past medical history, surgical history, allergies, family history, immunizations andmedications were updated in the EMR today and reviewed under the history and medication portions of their EMR.      ROS: 14 pt review of systems performed and negative (unless mentioned in an HPI)  Objective: BP 134/80   Pulse 99   Temp 98.3 F (36.8 C) (Oral)   Ht '5\' 2"'$  (1.575 m)   Wt 114 lb (51.7 kg)   SpO2 98%   BMI 20.85 kg/m  Physical Exam Vitals and nursing note reviewed.  Constitutional:      General: She is not in acute distress.    Appearance: Normal appearance. She is normal weight. She is not ill-appearing or toxic-appearing.  Eyes:     Extraocular Movements: Extraocular movements intact.     Conjunctiva/sclera: Conjunctivae normal.     Pupils: Pupils are equal, round, and reactive to light.  Cardiovascular:     Rate and Rhythm: Normal rate and regular rhythm.     Heart sounds: No murmur heard. Pulmonary:     Effort: Pulmonary effort is normal.     Breath sounds: Normal breath sounds.  Musculoskeletal:     Right lower leg: No edema.     Left lower leg: No edema.  Skin:    Findings: No rash.  Neurological:     Mental Status: She is alert and oriented to  person, place, and time. Mental status is at baseline.  Psychiatric:        Mood and Affect: Mood normal.        Behavior: Behavior normal.        Thought Content: Thought content normal.        Judgment: Judgment normal.     No results found.  Assessment/plan: [name] is a 66 y.o. female present for  B12 deficiency Compliant with self-administered B12 injection 1000 mcg every 14 days. - Vitamin B12 levels collected yearly at CPE.  Baseline is approximately 500 with the above regimen.   Heart palpitations/anxiety Continue atenolol 25 mg daily as needed  Psychiatry team prescribes Prozac, Xanax and Lamictal.   Return in about 21 weeks (around 05/28/2022) for cpe (20 min), Routine chronic condition follow-up.    No orders of the defined types were placed in this encounter.   Meds ordered this encounter  Medications   atenolol (TENORMIN) 25 MG tablet    Sig: Take 1 tablet (25 mg total) by mouth daily.    Dispense:  90 tablet    Refill:  3   cyanocobalamin (,VITAMIN B-12,) 1000 MCG/ML injection    Sig: INJECT 1 ML INTO THE MUSCLE EVERY 14 DAYS    Dispense:  6 mL    Refill:  3    MUST HAVE OV FOR FURTHER REFILLS   SYRINGE-NEEDLE, DISP, 3 ML (B-D 3CC LUER-LOK SYR 25GX1") 25G X 1" 3 ML MISC    Sig: Use as directed to inject b-12 into the skin    Dispense:  6 each    Refill:  3    Referral Orders  No referral(s) requested today     Electronically signed by: Howard Pouch, [place]

## 2022-01-01 NOTE — Progress Notes (Signed)
[facility] Counselor/Therapist Progress Note  Patient ID: [name], MRN: [id number],    Date: 01/01/2022  Time Spent: 55 minutes   Treatment Type: Individual Therapy  Reported Symptoms: depression, anxiety, anger, concerns about husband and her health  Mental Status Exam:  Appearance:   Casual     Behavior:  Appropriate, Sharing, and Motivated  Motor:  Normal  Speech/Language:   Clear and Coherent  Affect:  Depressed and anxious  Mood:  anxious and depressed  Thought process:  goal directed  Thought content:    overthinking  Sensory/Perceptual disturbances:    WNL  Orientation:  oriented to person, place, time/date, situation, day of week, month of year, year, and stated date of January 01, 2022  Attention:  Good  Concentration:  Good  Memory:  WNL  Fund of knowledge:   Good  Insight:    Good  Judgment:   Good  Impulse Control:  Good   Risk Assessment: Danger to Self:  No Self-injurious Behavior: No Danger to Others: No Duty to Warn:no Physical Aggression / Violence:No  Access to Firearms a concern: No  Gang Involvement:No   Subjective:  Patient in today reporting depression, anxiety, anger, and concerns about her and husband's health. Patient concerned also about her mom's memory and wanting to have her evaluated. Patient hoping to visit mom (who lives in [place]) soon.  Sleep is better and she is still working to reduce her worrying.  Also continuing to work on looking more for the positives versus negatives each day. Discussed her worries related to marital relationship, job, eventual retirement. Showing some good insight and wanting to make good decisions "going forward" which we processed further today. Some improvement at work with new addition to staff.  Interventions: Solution-Oriented/Positive Psychology and Insight-Oriented  Treatment Goals: Goals remain on treatment plan as patient works on strategies to meet her goals.  Progress will be noted each  session and documented in "Progress" section of treatment plan. Long term goal: Develop healthy cognitive patterns and beliefs about self and the world that lead to alleviation of depression and anxiety and help prevent relapse.  Progressing: Patient is motivated. Struggling to focus some due to immediate, unexpected health concern with husband.  Short term goal: Learn and implement personal skills for managing stress, solving daily problems, and resolving conflicts effectively. Strategy: Teach patient calming skills, problem solving skills, and conflict resolution skills, to better manage daily stressors  Diagnosis:   ICD-10-CM   1. Generalized anxiety disorder  F41.1       Plan: Patient today showing good participation and motivation in session as she worked more on her anxiety, depression, some anger, marital stressors, concerns about her elderly mother, and her own health issues.  Some job frustrations persist but patient also noticing more recently there have been some improvements with a person who was hired more recently.  To follow up on concerns discussed today, patient is to do some journaling in between sessions as she finds helpful, concentrate on goal directed behaviors especially in looking for more positives versus negatives, self calming skills, noticing the things she does well, and not assuming worst case scenarios.  To continue her focus on taking better care of herself emotionally and setting limits as needed with others, and communicating her needs more openly. Encouraged patient in her practice of more positive behaviors including: Believing in herself more that she can make important changes to influence her life more positively especially within the family and  at work, work on letting go of negativity and over worrying in order to see more positives and experience more hope, focus more on her own self-care, staying in touch with people that are supportive, practice more  consistent positive self talk, continue spending time outdoors in activities she enjoys especially biking, pursuing meaningful conversations with husband, take breaks as needed to experience more calmness for herself, intentionally look for more positives daily, search for the positives within herself and be able to name them, provide helpful feedback to husband regarding the positives when they occur in their relationship, maintain healthy boundaries with others as needed, reflect on her progress more often, and recognize the strength she shows working with goal directed behaviors to move in a direction that supports her improved emotional health and overall wellbeing.  Goal review and progress/challenges noted with patient.  Next appointment within 3 weeks.  This record has been created using [employer].  Chart creation errors have been sought, but may not always have been located and corrected.  Such creation errors do not reflect on the standard of medical care provided.   Shanon Ace, LCSW

## 2022-01-06 ENCOUNTER — Other Ambulatory Visit (HOSPITAL_COMMUNITY): Payer: Self-pay

## 2022-01-07 ENCOUNTER — Other Ambulatory Visit (HOSPITAL_COMMUNITY): Payer: Self-pay

## 2022-01-08 ENCOUNTER — Ambulatory Visit
Admission: RE | Admit: 2022-01-08 | Discharge: 2022-01-08 | Disposition: A | Discharge status: Home / Self Care | Payer: 59 | Source: Ambulatory Visit | Attending: Sports Medicine | Admitting: Sports Medicine

## 2022-01-08 DIAGNOSIS — M5030 Other cervical disc degeneration, unspecified cervical region: Secondary | ICD-10-CM

## 2022-01-08 DIAGNOSIS — M542 Cervicalgia: Secondary | ICD-10-CM

## 2022-01-08 DIAGNOSIS — M4802 Spinal stenosis, cervical region: Secondary | ICD-10-CM | POA: Diagnosis not present

## 2022-01-08 DIAGNOSIS — M5010 Cervical disc disorder with radiculopathy, unspecified cervical region: Secondary | ICD-10-CM | POA: Diagnosis not present

## 2022-01-08 MED ORDER — IOPAMIDOL (ISOVUE-M 300) INJECTION 61%
1.0000 mL | Freq: Once | INTRAMUSCULAR | Status: AC | PRN
  Administered 2022-01-08: 1 mL via EPIDURAL

## 2022-01-08 MED ORDER — TRIAMCINOLONE ACETONIDE 40 MG/ML IJ SUSP (RADIOLOGY)
60.0000 mg | Freq: Once | INTRAMUSCULAR | Status: AC
  Administered 2022-01-08: 60 mg via EPIDURAL

## 2022-01-08 NOTE — Discharge Instructions (Signed)

## 2022-01-20 ENCOUNTER — Other Ambulatory Visit (HOSPITAL_COMMUNITY): Payer: Self-pay

## 2022-01-21 ENCOUNTER — Other Ambulatory Visit (HOSPITAL_COMMUNITY): Payer: Self-pay

## 2022-01-22 ENCOUNTER — Ambulatory Visit: Payer: 59 | Admitting: Adult Health

## 2022-01-22 ENCOUNTER — Other Ambulatory Visit (HOSPITAL_COMMUNITY): Payer: Self-pay

## 2022-01-29 ENCOUNTER — Ambulatory Visit: Payer: 59 | Admitting: Family Medicine

## 2022-01-29 NOTE — Progress Notes (Deleted)
OFFICE VISIT  01/29/2022  CC: No chief complaint on file.  Patient is a 66 y.o. female who presents for concern of elevated blood pressure.  HPI: ***   Past Medical History:  Diagnosis Date   Allergy    Anemia    after birth of child 60yr ago   Anxiety and depression    with insomnia   Blood transfusion without reported diagnosis    x3   Chronic back pain    ? RA, ? Lupus - reports saw rheumatologist in the past but better now, sees Dr. PTrenton Gammon  Eczema    Flushing 05/27/2021   GERD 02/13/2007   Qualifier: Diagnosis of  By: JJenny ReichmannMD, JHunt Oris   GERD (gastroesophageal reflux disease)    takes Omeprazole daily, with nausea   Greater trochanteric bursitis of left hip 03/25/2019   Injected March 25, 2019   Heart murmur    Hemorrhoids    History of kidney stones    passed on her own   History of migraine    last one a yr ago and takes Zomig prn   Hot flashes 11/10/2014   HTN (hypertension) 03/31/2012   Hx of echocardiogram    Echo (8/15):  EF 55-60%, no RWMA   IBS (irritable bowel syndrome)    constipation predominant   Interstitial cystitis    hx of UTI   Irritable bowel syndrome 12/14/2009   Qualifier: Diagnosis of  By: GCarlean PurlMD, FDimas Millin   Memory loss 12/22/2016   Numbness    both legs and related to back   Osteoporosis    osteopenia   Palpitations    on metoprolol in past but made BP too low   Pneumonia    hx of;last time 139yrago   PONV (postoperative nausea and vomiting)    laryngospasm-1999   Poor vision 10/15/2015   -? Ocular rosacea -sees opthomology    Raynaud's disease /phenomenon 10/21/2012   no meds, worse in the winter or cold environment.    Rheumatoid aortitis    uses Diclofenac gel;Rheumatoid   Seasonal allergies    Flonase daily    Strain of left piriformis muscle 05/18/2019   Urinary incontinence     Past Surgical History:  Procedure Laterality Date   ABDOMINAL HYSTERECTOMY  1997   BREAST BIOPSY  1999   benign   COLONOSCOPY   02/11/2013 and 12/24/04   internal and external hemorrhoids   ENDOMETRIAL ABLATION     EXCISION MORTON'S NEUROMA Right    FLEXIBLE SIGMOIDOSCOPY  03/07/2008   internal and external hemorrhoids   POSTERIOR LUMBAR FUSION  05/02/2013   L4-L5 fusion (cage/screws/bone graft) Dr. PoAnnette Stable TONSILLECTOMY     UPPER GASTROINTESTINAL ENDOSCOPY  10/23/2010   gastritis, irregular Z-line   wisdom teeth extracted      Outpatient Medications Prior to Visit  Medication Sig Dispense Refill   atenolol (TENORMIN) 25 MG tablet Take 1 tablet (25 mg total) by mouth daily. 90 tablet 3   B-D 3CC LUER-LOK SYR 25GX1" 25G X 1" 3 ML MISC USE WITH CYANOCOBALAMIN EVERY 14 DAYS     cholecalciferol (VITAMIN D) 1000 UNITS tablet Take 1,000 Units by mouth every evening.     clobetasol (TEMOVATE) 0.05 % external solution APPLY TOPICALLY ONCE DAILY AS NEEDED FOR DERMATITIS 50 mL 2   cyanocobalamin (,VITAMIN B-12,) 1000 MCG/ML injection INJECT 1 ML INTO THE MUSCLE EVERY 14 DAYS 6 mL 3   FLUoxetine (PROZAC) 20 MG capsule  Take 1 capsule by mouth daily. 90 capsule 3   fluticasone (FLONASE) 50 MCG/ACT nasal spray Place 2 sprays into the nose daily. 16 g 5   hydrocortisone (ANUSOL-HC) 2.5 % rectal cream Insert rectally 2 times daily for 10 days 30 g 1   lamoTRIgine (LAMICTAL) 200 MG tablet Take 1 tablet (200 mg total) by mouth at bedtime. 90 tablet 3   SYRINGE-NEEDLE, DISP, 3 ML (B-D 3CC LUER-LOK SYR 25GX1") 25G X 1" 3 ML MISC Use as directed to inject b-12 into the skin 6 each 3   No facility-administered medications prior to visit.    Allergies  Allergen Reactions   Aspartame And Phenylalanine Other (See Comments)    Migraines   Beef-Derived Products Other (See Comments)    severe cramping   Citrus Other (See Comments)    Causes Interstitial cystitis flares   Penicillins Hives and Rash    Denies airway involvement Has patient had a PCN reaction causing immediate rash, facial/tongue/throat swelling, SOB or lightheadedness  with hypotension: yes hives.  Has patient had a PCN reaction causing severe rash involving mucus membranes or skin necrosis:no Has patient had a PCN reaction that required hospitalization No Has patient had a PCN reaction occurring within the last 10 years: No If all of the above answers are "NO", then may proceed with Cephalosporin use.    Promethazine Hcl Other (See Comments)    Muscle twitching and contracture    Beta Vulgaris Other (See Comments)    beets   Adhesive [Tape] Other (See Comments)    Red splotches   Doxycycline Nausea And Vomiting   Morphine Sulfate Nausea And Vomiting    ROS As per HPI  PE:    01/08/2022    2:32 PM 01/01/2022    2:05 PM 12/30/2021    3:26 PM  Vitals with BMI  Height  '5\' 2"'$  '5\' 2"'$   Weight  114 lbs 114 lbs  BMI  16.57 90.38  Systolic 333 832 919  Diastolic 94 80 82  Pulse 59 99 82     Physical Exam  ***  LABS:  Last CBC Lab Results  Component Value Date   WBC 4.7 03/29/2021   HGB 14.0 03/29/2021   HCT 43.2 03/29/2021   MCV 93.3 03/29/2021   MCH 30.3 12/22/2016   RDW 13.7 03/29/2021   PLT 186.0 16/60/6004   Last metabolic panel Lab Results  Component Value Date   GLUCOSE 80 05/27/2021   NA 141 05/27/2021   K 5.0 05/27/2021   CL 102 05/27/2021   CO2 30 05/27/2021   BUN 25 (H) 05/27/2021   CREATININE 1.04 05/27/2021   GFRNONAA 65 12/22/2016   CALCIUM 9.8 07/04/2021   PHOS 4.3 02/28/2010   PROT 6.9 03/29/2021   ALBUMIN 4.6 03/29/2021   LABGLOB 2.3 12/22/2016   AGRATIO 2.0 12/22/2016   BILITOT 0.5 03/29/2021   ALKPHOS 81 03/29/2021   AST 20 03/29/2021   ALT 13 03/29/2021   ANIONGAP 10 11/01/2015   Last thyroid functions Lab Results  Component Value Date   TSH 2.53 05/27/2021   IMPRESSION AND PLAN:  No problem-specific Assessment & Plan notes found for this encounter.  If start med-->hctz? ?amlod  An After Visit Summary was printed and given to the patient.  FOLLOW UP: No follow-ups on file.  Signed:  Crissie Sickles, MD           01/29/2022

## 2022-01-30 ENCOUNTER — Encounter: Payer: Self-pay | Admitting: Family Medicine

## 2022-01-30 ENCOUNTER — Ambulatory Visit: Payer: 59 | Admitting: Family Medicine

## 2022-01-30 ENCOUNTER — Ambulatory Visit: Payer: 59 | Admitting: Psychiatry

## 2022-01-30 VITALS — BP 117/71 | HR 80 | Temp 98.8°F | Ht 62.0 in | Wt 115.4 lb

## 2022-01-30 DIAGNOSIS — F411 Generalized anxiety disorder: Secondary | ICD-10-CM | POA: Diagnosis not present

## 2022-01-30 DIAGNOSIS — I10 Essential (primary) hypertension: Secondary | ICD-10-CM | POA: Diagnosis not present

## 2022-01-30 MED ORDER — AMLODIPINE BESYLATE 2.5 MG PO TABS
2.5000 mg | ORAL_TABLET | Freq: Every day | ORAL | 0 refills | Status: DC
  Filled 2022-01-30: qty 30, 30d supply, fill #0

## 2022-01-30 NOTE — Progress Notes (Signed)
[facility] Counselor/Therapist Progress Note  Patient ID: [name], MRN: [id number],    Date: 01/30/2022  Time Spent: 55 minutes   Treatment Type: Individual Therapy  Reported Symptoms: anxiety,depression, tired, stressed  Mental Status Exam:  Appearance:   Casual     Behavior:  Appropriate, Sharing, and Motivated  Motor:  Normal  Speech/Language:   Clear and Coherent  Affect:  Appropriate and some anxiousness  Mood:  anxious and depressed  Thought process:  goal directed  Thought content:    WNL  Sensory/Perceptual disturbances:    WNL  Orientation:  oriented to person, place, time/date, situation, day of week, month of year, year, and stated date of January 30, 2022  Attention:  Good  Concentration:  Good  Memory:  WNL  Fund of knowledge:   Good  Insight:    Good  Judgment:   Good  Impulse Control:  Good   Risk Assessment: Danger to Self:  No Self-injurious Behavior: No Danger to Others: No Duty to Warn:no Physical Aggression / Violence:No  Access to Firearms a concern: No  Gang Involvement:No   Subjective: Patient  in today reporting anxiety, depression, tiredness, and some anger and irritability. Symptoms mostly related to work and home. May work through August 2024. Patient needing session today to process her feelings of anxiety, depression, and anger within family relationships. Anxious, frustrated and talking through and looking at "my options at this point". Doesn't want to quit working yet and discussed today some coping strategies that can help her be and fel more calm at work and home. Feels at least half of her stress is due to work and discussed today how she might better manage the stress at work better and not reach the point of "feeling I'm going to explode" and "I feel that way at home too sometimes."  Talked about strategies for better stress management so she can be more proactive at times in managing the stress versus the stress managing her.   Good insight, seemed less agitated and anxious/angry by session and after having talked through a lot of her concerns.  Remains focused on behavior changes she wants to make and also making good decisions going forward as she anticipates eventual retirement.  Interventions: Cognitive Behavioral Therapy   Treatment Goals: Goals remain on treatment plan as patient works on strategies to meet her goals.  Progress will be noted each session and documented in "Progress" section of treatment plan. Long term goal: Develop healthy cognitive patterns and beliefs about self and the world that lead to alleviation of depression and anxiety and help prevent relapse.  Progressing: Patient is motivated. Struggling to focus some due to immediate, unexpected health concern with husband.  Short term goal: Learn and implement personal skills for managing stress, solving daily problems, and resolving conflicts effectively. Strategy: Teach patient calming skills, problem solving skills, and conflict resolution skills, to better manage daily stressors  Diagnosis:   ICD-10-CM   1. Generalized anxiety disorder  F41.1      Plan: Patient today showing good motivation and active participation in session working more on her anxiety, some depression, tiredness and some anger/agitation/irritability related mostly to work and family situations.  Used most of session today to focus on the work and family situations as this was really spiking her anxiety along with the anger/agitation/irritability.  Discussed marital stressors, concerns with her elderly mother, concerns in her marital relationship and husband's health as well as her own health issues.  Also processed work stressors and ways she might be able to better manage them on site at her job and trying not to "bring stressors home with her".  Patient encouraged to continue use of journaling between sessions, concentrating more on goal directed behaviors particularly in  decreasing her focus on negatives and looking for more positives, practicing self calming skills and paying more attention to what she does well versus mistakes, not assuming worst case scenarios, setting healthy limits with others as needed. Encouraged patient in her practice of positive behaviors including: Work on letting go of negativity and over worrying in order to see more positives and experience more hope, believing herself more that she can make important changes to influence her life positively especially within the family and at work, focus more on her own self-care, staying in touch with people that are supportive, practice more consistent positive self talk, continue spending time outdoors in activities she enjoys especially biking, pursuing meaningful conversations with husband, take breaks as needed to experience more calmness for herself, intentionally look for more positives each day, search for the positives within herself and be able to name them, provide helpful feedback to husband regarding the positives when they occur in their relationship, maintain healthy boundaries with others as needed, reflect on her progress more often, and recognize the strength she shows when working with goal directed behaviors to move in a direction that supports her improved emotional health.  Goal review and progress/challenges noted with patient.  Next appointment within approximately 3 weeks.  This record has been created using [employer].  Chart creation errors have been sought, but may not always have been located and corrected.  Such creation errors do not reflect on the standard of medical care provided.   Shanon Ace, LCSW

## 2022-01-30 NOTE — Progress Notes (Signed)
OFFICE VISIT  01/30/2022  CC: elev bp  Patient is a 66 y.o. female who presents for elevated blood pressure.  HPI: [name] reports recent blood pressures up in the 269S to 854O systolic, sometimes in the 27O diastolic. It sounds like in the past years she has had an elevated blood pressure occasionally but never to this level. She estimates her average systolic blood pressure to be around 140 the last 2 weeks, diastolics in the 35K typically.  Heart rate 70s to 80s.  Feels a bit of pulsations in her temples when blood pressure is elevated. She is on atenolol for history of palpitations.  States she has never been treated for hypertension.  She eats very healthy and is an avid cyclist.  She takes no over-the-counter decongestants.  She drinks 2 cups of coffee daily. She takes 325 mg aspirin an average of 2 days a week----for joint pain.  She is not currently in any pain and states that her elevated blood pressures have not been during any time of pain.  Review of systems: No dizziness, headache, palpitations, chest pain, shortness of breath, or lower extremity swelling.  No vision abnormalities.  Past Medical History:  Diagnosis Date   Allergy    Anemia    after birth of child 68yr ago   Anxiety and depression    with insomnia   Blood transfusion without reported diagnosis    x3   Chronic back pain    ? RA, ? Lupus - reports saw rheumatologist in the past but better now, sees Dr. PTrenton Gammon  Eczema    Flushing 05/27/2021   GERD 02/13/2007   Qualifier: Diagnosis of  By: JJenny ReichmannMD, JHunt Oris   GERD (gastroesophageal reflux disease)    takes Omeprazole daily, with nausea   Greater trochanteric bursitis of left hip 03/25/2019   Injected March 25, 2019   Heart murmur    Hemorrhoids    History of kidney stones    passed on her own   History of migraine    last one a yr ago and takes Zomig prn   Hot flashes 11/10/2014   HTN (hypertension) 03/31/2012   Hx of echocardiogram    Echo  (8/15):  EF 55-60%, no RWMA   IBS (irritable bowel syndrome)    constipation predominant   Interstitial cystitis    hx of UTI   Irritable bowel syndrome 12/14/2009   Qualifier: Diagnosis of  By: GCarlean PurlMD, FDimas Millin   Memory loss 12/22/2016   Numbness    both legs and related to back   Osteoporosis    osteopenia   Palpitations    on metoprolol in past but made BP too low   Pneumonia    hx of;last time 150yrago   PONV (postoperative nausea and vomiting)    laryngospasm-1999   Poor vision 10/15/2015   -? Ocular rosacea -sees opthomology    Raynaud's disease /phenomenon 10/21/2012   no meds, worse in the winter or cold environment.    Rheumatoid aortitis    uses Diclofenac gel;Rheumatoid   Seasonal allergies    Flonase daily    Strain of left piriformis muscle 05/18/2019   Urinary incontinence     Past Surgical History:  Procedure Laterality Date   ABDOMINAL HYSTERECTOMY  1997   BREAST BIOPSY  1999   benign   COLONOSCOPY  02/11/2013 and 12/24/04   internal and external hemorrhoids   ENDOMETRIAL ABLATION     EXCISION MORTON'S NEUROMA  Right    FLEXIBLE SIGMOIDOSCOPY  03/07/2008   internal and external hemorrhoids   POSTERIOR LUMBAR FUSION  05/02/2013   L4-L5 fusion (cage/screws/bone graft) Dr. Annette Stable   TONSILLECTOMY     UPPER GASTROINTESTINAL ENDOSCOPY  10/23/2010   gastritis, irregular Z-line   wisdom teeth extracted      Outpatient Medications Prior to Visit  Medication Sig Dispense Refill   atenolol (TENORMIN) 25 MG tablet Take 1 tablet (25 mg total) by mouth daily. 90 tablet 3   B-D 3CC LUER-LOK SYR 25GX1" 25G X 1" 3 ML MISC USE WITH CYANOCOBALAMIN EVERY 14 DAYS     cholecalciferol (VITAMIN D) 1000 UNITS tablet Take 1,000 Units by mouth every evening.     clobetasol (TEMOVATE) 0.05 % external solution APPLY TOPICALLY ONCE DAILY AS NEEDED FOR DERMATITIS 50 mL 2   cyanocobalamin (,VITAMIN B-12,) 1000 MCG/ML injection INJECT 1 ML INTO THE MUSCLE EVERY 14 DAYS 6 mL 3    FLUoxetine (PROZAC) 20 MG capsule Take 1 capsule by mouth daily. 90 capsule 3   fluticasone (FLONASE) 50 MCG/ACT nasal spray Place 2 sprays into the nose daily. 16 g 5   hydrocortisone (ANUSOL-HC) 2.5 % rectal cream Insert rectally 2 times daily for 10 days 30 g 1   lamoTRIgine (LAMICTAL) 200 MG tablet Take 1 tablet (200 mg total) by mouth at bedtime. 90 tablet 3   SYRINGE-NEEDLE, DISP, 3 ML (B-D 3CC LUER-LOK SYR 25GX1") 25G X 1" 3 ML MISC Use as directed to inject b-12 into the skin 6 each 3   No facility-administered medications prior to visit.    Allergies  Allergen Reactions   Aspartame And Phenylalanine Other (See Comments)    Migraines   Beef-Derived Products Other (See Comments)    severe cramping   Citrus Other (See Comments)    Causes Interstitial cystitis flares   Penicillins Hives and Rash    Denies airway involvement Has patient had a PCN reaction causing immediate rash, facial/tongue/throat swelling, SOB or lightheadedness with hypotension: yes hives.  Has patient had a PCN reaction causing severe rash involving mucus membranes or skin necrosis:no Has patient had a PCN reaction that required hospitalization No Has patient had a PCN reaction occurring within the last 10 years: No If all of the above answers are "NO", then may proceed with Cephalosporin use.    Promethazine Hcl Other (See Comments)    Muscle twitching and contracture    Beta Vulgaris Other (See Comments)    beets   Adhesive [Tape] Other (See Comments)    Red splotches   Doxycycline Nausea And Vomiting   Morphine Sulfate Nausea And Vomiting    ROS As per HPI  PE:    01/30/2022    4:00 PM 01/08/2022    2:32 PM 01/01/2022    2:05 PM  Vitals with BMI  Height '5\' 2"'$   '5\' 2"'$   Weight 115 lbs 6 oz  114 lbs  BMI 25.3  66.44  Systolic 034 742 595  Diastolic 71 94 80  Pulse 80 59 99     Physical Exam  Gen: Alert, well appearing.  Patient is oriented to person, place, time, and situation. AFFECT:  pleasant, lucid thought and speech. CV: RRR, no m/r/g.   LUNGS: CTA bilat, nonlabored resps, good aeration in all lung fields. EXT: no clubbing or cyanosis.  no edema.   LABS:  Last CBC Lab Results  Component Value Date   WBC 4.7 03/29/2021   HGB 14.0 03/29/2021   HCT  43.2 03/29/2021   MCV 93.3 03/29/2021   MCH 30.3 12/22/2016   RDW 13.7 03/29/2021   PLT 186.0 88/91/6945   Last metabolic panel Lab Results  Component Value Date   GLUCOSE 80 05/27/2021   NA 141 05/27/2021   K 5.0 05/27/2021   CL 102 05/27/2021   CO2 30 05/27/2021   BUN 25 (H) 05/27/2021   CREATININE 1.04 05/27/2021   GFRNONAA 65 12/22/2016   CALCIUM 9.8 07/04/2021   PHOS 4.3 02/28/2010   PROT 6.9 03/29/2021   ALBUMIN 4.6 03/29/2021   LABGLOB 2.3 12/22/2016   AGRATIO 2.0 12/22/2016   BILITOT 0.5 03/29/2021   ALKPHOS 81 03/29/2021   AST 20 03/29/2021   ALT 13 03/29/2021   ANIONGAP 10 11/01/2015   Last thyroid functions Lab Results  Component Value Date   TSH 2.53 05/27/2021   IMPRESSION AND PLAN:  #1 hypertension, mild and intermittent for quite a while now and gradually becoming more prominent. Start amlodipine 2.5 mg daily. Continue all other current medications.  An After Visit Summary was printed and given to the patient.  FOLLOW UP: Return for 7-10d f/u HTN.  Signed:  Crissie Sickles, MD           01/30/2022

## 2022-01-31 ENCOUNTER — Other Ambulatory Visit (HOSPITAL_COMMUNITY): Payer: Self-pay

## 2022-02-03 ENCOUNTER — Ambulatory Visit: Payer: 59 | Admitting: Family Medicine

## 2022-02-12 ENCOUNTER — Ambulatory Visit (INDEPENDENT_AMBULATORY_CARE_PROVIDER_SITE_OTHER): Payer: 59 | Admitting: Adult Health

## 2022-02-12 ENCOUNTER — Other Ambulatory Visit (HOSPITAL_COMMUNITY): Payer: Self-pay

## 2022-02-12 ENCOUNTER — Encounter: Payer: Self-pay | Admitting: Adult Health

## 2022-02-12 DIAGNOSIS — F4310 Post-traumatic stress disorder, unspecified: Secondary | ICD-10-CM | POA: Diagnosis not present

## 2022-02-12 DIAGNOSIS — F331 Major depressive disorder, recurrent, moderate: Secondary | ICD-10-CM

## 2022-02-12 DIAGNOSIS — G4700 Insomnia, unspecified: Secondary | ICD-10-CM | POA: Diagnosis not present

## 2022-02-12 DIAGNOSIS — F410 Panic disorder [episodic paroxysmal anxiety] without agoraphobia: Secondary | ICD-10-CM

## 2022-02-12 DIAGNOSIS — F411 Generalized anxiety disorder: Secondary | ICD-10-CM | POA: Diagnosis not present

## 2022-02-12 MED ORDER — ALPRAZOLAM 0.5 MG PO TABS
0.5000 mg | ORAL_TABLET | Freq: Three times a day (TID) | ORAL | 2 refills | Status: DC | PRN
  Filled 2022-02-12: qty 90, 30d supply, fill #0

## 2022-02-12 NOTE — Progress Notes (Addendum)
[name] [id number] 12/11/1955 66 y.o.  Subjective:   Patient ID:  [name] is a 66 y.o. (DOB 1956/03/20) female.  Chief Complaint: No chief complaint on file.   HPI [name] presents to the office today for follow-up of MDD, GAD, panic disorder, insomnia and PTSD.  Describes mood today as "better". Pleasant. Decreased tearfulness. Mood symptoms - reports anxiety - more situational. Denies depression. Feels irritable at times. Denies recent panic attacks. Reporting some worry and rumination. Stating - "I'm hanging in there". Feels like medications are helpful. Concerned about husband's health. Improved interest and motivation. Seeing therapist - Rockne Menghini. Taking medications as prescribed.  Energy levels stable. Active, has a regular exercise routine - bike riding.  Enjoys some usual interests and activities. Married. Lives with husband of 16 years. Mother lives in [place]. Spending time with family. Appetite adequate. Weight loss - 112 pounds. Sleeps better some nights than other. Averages 6 hours. Focus and concentration stable. Completing tasks. Managing aspects of household. 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  Denies SI or HI.  Denies AH or VH.  Previous medication trials: Remeron, Trazadone, Restoril, Xanax, Seroquel, Belsomra   GAD-7    Flowsheet Row Office Visit from 01/01/2022 in [facility] Visit from 04/27/2019 in [facility] Visit from 12/29/2018 in [facility]  Total GAD-7 Score 7 11 18       Mini-Mental    Flowsheet Row Office Visit from 12/22/2016 in [facility]  Total Score (max 30 points ) 30      PHQ2-9    Flowsheet Row Office Visit from 01/01/2022 in [facility] [facility] Visit from 05/27/2021 in [facility] Visit from 03/29/2021 in [facility] Visit from 05/23/2020 in [facility] [place] [facility] Visit from 04/27/2019 in [facility] Total Score 0 0 0 0 0  PHQ-9 Total Score 1 -- 0 -- --        Review of Systems:  Review of Systems  Musculoskeletal:  Negative for gait problem.  Neurological:  Negative for tremors.  Psychiatric/Behavioral:         Please refer to HPI    Medications: I have reviewed the patient's current medications.  Current Outpatient Medications  Medication Sig Dispense Refill   ALPRAZolam (XANAX) 0.5 MG tablet Take 1 tablet (0.5 mg total) by mouth 3 (three) times daily as needed for anxiety. 90 tablet 2   amLODipine (NORVASC) 2.5 MG tablet Take 1 tablet (2.5 mg total) by mouth daily. 30 tablet 0   atenolol (TENORMIN) 25 MG tablet Take 1 tablet (25 mg total) by mouth daily. 90 tablet 3   B-D 3CC LUER-LOK SYR 25GX1" 25G X 1" 3 ML MISC USE WITH CYANOCOBALAMIN EVERY 14 DAYS     cholecalciferol (VITAMIN D) 1000 UNITS tablet Take 1,000 Units by mouth every evening.     clobetasol (TEMOVATE) 0.05 % external solution APPLY TOPICALLY ONCE DAILY AS NEEDED FOR DERMATITIS 50 mL 2   cyanocobalamin (,VITAMIN B-12,) 1000 MCG/ML injection INJECT 1 ML INTO THE MUSCLE EVERY 14 DAYS 6 mL 3   FLUoxetine (PROZAC) 20 MG capsule Take 1 capsule by mouth daily. 90 capsule 3   fluticasone (FLONASE) 50 MCG/ACT nasal spray Place 2 sprays into the nose daily. 16 g  5   hydrocortisone (ANUSOL-HC) 2.5 % rectal cream Insert rectally 2 times daily for 10 days 30 g 1   lamoTRIgine (LAMICTAL) 200 MG tablet Take 1 tablet (200 mg total) by mouth at bedtime. 90 tablet 3   SYRINGE-NEEDLE, DISP, 3 ML (B-D 3CC LUER-LOK SYR 25GX1") 25G X 1" 3 ML MISC Use as directed to inject b-12 into the skin 6 each 3   No current facility-administered medications for this visit.    Medication Side Effects: None  Allergies:  Allergies  Allergen Reactions   Aspartame And Phenylalanine Other (See  Comments)    Migraines   Beef-Derived Products Other (See Comments)    severe cramping   Citrus Other (See Comments)    Causes Interstitial cystitis flares   Penicillins Hives and Rash    Denies airway involvement Has patient had a PCN reaction causing immediate rash, facial/tongue/throat swelling, SOB or lightheadedness with hypotension: yes hives.  Has patient had a PCN reaction causing severe rash involving mucus membranes or skin necrosis:no Has patient had a PCN reaction that required hospitalization No Has patient had a PCN reaction occurring within the last 10 years: No If all of the above answers are "NO", then may proceed with Cephalosporin use.    Promethazine Hcl Other (See Comments)    Muscle twitching and contracture    Beta Vulgaris Other (See Comments)    beets   Adhesive [Tape] Other (See Comments)    Red splotches   Doxycycline Nausea And Vomiting   Morphine Sulfate Nausea And Vomiting    Past Medical History:  Diagnosis Date   Allergy    Anemia    after birth of child 79yrs ago   Anxiety and depression    with insomnia   Blood transfusion without reported diagnosis    x3   Chronic back pain    ? RA, ? Lupus - reports saw rheumatologist in the past but better now, sees Dr. Dutch Quint   Eczema    Flushing 05/27/2021   GERD 02/13/2007   Qualifier: Diagnosis of  By: Jonny Ruiz MD, Len Blalock    GERD (gastroesophageal reflux disease)    takes Omeprazole daily, with nausea   Greater trochanteric bursitis of left hip 03/25/2019   Injected March 25, 2019   Heart murmur    Hemorrhoids    History of kidney stones    passed on her own   History of migraine    last one a yr ago and takes Zomig prn   Hot flashes 11/10/2014   HTN (hypertension) 03/31/2012   Hx of echocardiogram    Echo (8/15):  EF 55-60%, no RWMA   IBS (irritable bowel syndrome)    constipation predominant   Interstitial cystitis    hx of UTI   Irritable bowel syndrome 12/14/2009   Qualifier: Diagnosis of   By: Leone Payor MD, Charlyne Quale    Memory loss 12/22/2016   Numbness    both legs and related to back   Osteoporosis    osteopenia   Palpitations    on metoprolol in past but made BP too low   Pneumonia    hx of;last time 26yrs ago   PONV (postoperative nausea and vomiting)    laryngospasm-1999   Poor vision 10/15/2015   -? Ocular rosacea -sees opthomology    Raynaud's disease /phenomenon 10/21/2012   no meds, worse in the winter or cold environment.    Rheumatoid aortitis    uses Diclofenac gel;Rheumatoid   Seasonal allergies  Flonase daily    Strain of left piriformis muscle 05/18/2019   Urinary incontinence     Past Medical History, Surgical history, Social history, and Family history were reviewed and updated as appropriate.   Please see review of systems for further details on the patient's review from today.   Objective:   Physical Exam:  There were no vitals taken for this visit.  Physical Exam Constitutional:      General: She is not in acute distress. Musculoskeletal:        General: No deformity.  Neurological:     Mental Status: She is alert and oriented to person, place, and time.     Coordination: Coordination normal.  Psychiatric:        Attention and Perception: Attention and perception normal. She does not perceive auditory or visual hallucinations.        Mood and Affect: Mood normal. Mood is not anxious or depressed. Affect is not labile, blunt, angry or inappropriate.        Speech: Speech normal.        Behavior: Behavior normal.        Thought Content: Thought content normal. Thought content is not paranoid or delusional. Thought content does not include homicidal or suicidal ideation. Thought content does not include homicidal or suicidal plan.        Cognition and Memory: Cognition and memory normal.        Judgment: Judgment normal.     Comments: Insight intact     Lab Review:     Component Value Date/Time   NA 141 05/27/2021 1112   NA 143  12/22/2016 0948   K 5.0 05/27/2021 1112   CL 102 05/27/2021 1112   CO2 30 05/27/2021 1112   GLUCOSE 80 05/27/2021 1112   GLUCOSE 95 04/21/2006 1635   BUN 25 (H) 05/27/2021 1112   BUN 17 12/22/2016 0948   CREATININE 1.04 05/27/2021 1112   CALCIUM 9.8 07/04/2021 1150   PROT 6.9 03/29/2021 1243   PROT 7.0 12/22/2016 0948   ALBUMIN 4.6 03/29/2021 1243   ALBUMIN 4.7 12/22/2016 0948   AST 20 03/29/2021 1243   ALT 13 03/29/2021 1243   ALKPHOS 81 03/29/2021 1243   BILITOT 0.5 03/29/2021 1243   BILITOT 0.3 12/22/2016 0948   GFRNONAA 65 12/22/2016 0948   GFRAA 75 12/22/2016 0948       Component Value Date/Time   WBC 4.7 03/29/2021 1243   RBC 4.63 03/29/2021 1243   HGB 14.0 03/29/2021 1243   HGB 14.1 12/22/2016 0948   HCT 43.2 03/29/2021 1243   HCT 44.0 12/22/2016 0948   PLT 186.0 03/29/2021 1243   PLT 206 12/22/2016 0948   MCV 93.3 03/29/2021 1243   MCV 94 12/22/2016 0948   MCH 30.3 12/22/2016 0948   MCH 30.7 11/01/2015 1154   MCHC 32.4 03/29/2021 1243   RDW 13.7 03/29/2021 1243   RDW 14.3 12/22/2016 0948   LYMPHSABS 1.2 03/29/2021 1243   LYMPHSABS 0.9 12/22/2016 0948   MONOABS 0.5 03/29/2021 1243   EOSABS 0.2 03/29/2021 1243   EOSABS 0.1 12/22/2016 0948   BASOSABS 0.0 03/29/2021 1243   BASOSABS 0.0 12/22/2016 0948    No results found for: "POCLITH", "LITHIUM"   No results found for: "PHENYTOIN", "PHENOBARB", "VALPROATE", "CBMZ"   .res Assessment: Plan:    Plan:  PDMP reviewed  1. Xanax 0.5mg  TID - using some at bedtime 2. Lamictal 200mg  daily 3. Prozac 20mg  daily  RTC 6 months  Patient  advised to contact office with any questions, adverse effects, or acute worsening in signs and symptoms.  Discussed potential benefits, risk, and side effects of benzodiazepines to include potential risk of tolerance and dependence, as well as possible drowsiness.  Advised patient not to drive if experiencing drowsiness and to take lowest possible effective dose to minimize  risk of dependence and tolerance.  Time spent with patient was 25 minutes. Greater than 50% of face to face time with patient was spent on counseling and coordination of care.   Diagnoses and all orders for this visit:  Generalized anxiety disorder -     ALPRAZolam (XANAX) 0.5 MG tablet; Take 1 tablet (0.5 mg total) by mouth 3 (three) times daily as needed for anxiety.  Insomnia, unspecified type  Panic disorder  Moderate recurrent major depression (HCC)  PTSD (post-traumatic stress disorder)     Please see After Visit Summary for patient specific instructions.  Future Appointments  Date Time Provider Department Center  02/14/2022  8:20 AM Felix Pacini A, DO [facility]  02/24/2022 10:00 AM Judi Saa, DO [facility] None  03/03/2022  9:00 AM Mathis Fare, LCSW [facility] None  03/26/2022  9:00 AM Mathis Fare, LCSW [facility] None  05/29/2022 10:20 AM Claiborne Billings, Renee A, DO [facility]  08/15/2022  4:00 PM Edia Pursifull, Thereasa Solo, NP [facility] None    No orders of the defined types were placed in this encounter.   -------------------------------

## 2022-02-14 ENCOUNTER — Encounter: Payer: Self-pay | Admitting: Family Medicine

## 2022-02-14 ENCOUNTER — Other Ambulatory Visit (HOSPITAL_COMMUNITY): Payer: Self-pay

## 2022-02-14 ENCOUNTER — Ambulatory Visit: Payer: 59 | Admitting: Family Medicine

## 2022-02-14 VITALS — BP 117/66 | HR 58 | Temp 98.2°F | Ht 62.0 in | Wt 116.0 lb

## 2022-02-14 DIAGNOSIS — R002 Palpitations: Secondary | ICD-10-CM

## 2022-02-14 DIAGNOSIS — I10 Essential (primary) hypertension: Secondary | ICD-10-CM | POA: Diagnosis not present

## 2022-02-14 MED ORDER — AMLODIPINE BESYLATE 2.5 MG PO TABS
2.5000 mg | ORAL_TABLET | Freq: Every day | ORAL | 1 refills | Status: DC
  Filled 2022-02-14 – 2022-04-21 (×2): qty 90, 90d supply, fill #0

## 2022-02-14 NOTE — Progress Notes (Signed)
Patient ID: [name], female  DOB: January 20, 1956, 66 y.o.   MRN: [id number] Patient Care Team    Relationship Specialty Notifications Start End  Ma Hillock, DO PCP - General Family Medicine  12/29/18   Gavin Pound, MD Consulting Physician Rheumatology  04/09/17   Maisie Fus, MD (Inactive) Consulting Physician Obstetrics and Gynecology  04/09/17   Devra Dopp, MD Referring Physician Dermatology  04/09/17   Group, [place]  12/29/18    Comment: [facility]    12/29/18   [name], MD Consulting Physician Gastroenterology  12/29/18   Earnie Larsson, MD Consulting Physician Neurosurgery  12/29/18     Chief Complaint  Patient presents with  . Hypertension    F/u; pt is not fasting    Subjective: [name] is a 66 y.o.  Female  present for hypertension follow-up Patient was seen by another provider for elevated blood pressures.  She was started on amlodipine 2.5 mg daily approximately 2 weeks ago.  She reports today home blood pressures have mostly been in the normal range with a few outliers in the 150s.  She reports she feels well on medicine and has no negative side effects.Patient denies chest pain, shortness of breath, dizziness or lower extremity edema.      01/01/2022    2:16 PM 05/27/2021   10:37 AM 03/29/2021   11:34 AM 05/23/2020    9:40 AM 04/27/2019    9:38 AM  Depression screen PHQ 2/9  Decreased Interest 0 0 0 0 0  Down, Depressed, Hopeless 0 0 0 0 0  PHQ - 2 Score 0 0 0 0 0  Altered sleeping 1  0    Tired, decreased energy 0  0    Change in appetite 0  0    Feeling bad or failure about yourself  0  0    Trouble concentrating 0  0    Moving slowly or fidgety/restless 0  0    Suicidal thoughts 0  0    PHQ-9 Score 1  0    Difficult doing work/chores   Not difficult at all        01/01/2022    2:17 PM 04/27/2019    9:39 AM 12/29/2018    1:56 PM  GAD 7 : Generalized  Anxiety Score  Nervous, Anxious, on Edge '2 1 3  '$ Control/stop worrying '1 2 3  '$ Worry too much - different things '1 2 3  '$ Trouble relaxing '1 1 2  '$ Restless '1 1 2  '$ Easily annoyed or irritable '1 2 3  '$ Afraid - awful might happen 0 2 2  Total GAD 7 Score '7 11 18  '$ Anxiety Difficulty  Somewhat difficult Extremely difficult   Immunization History  Administered Date(s) Administered  . Influenza,inj,Quad PF,6+ Mos 04/12/2014  . Influenza-Unspecified 05/15/2015, 03/28/2018, 04/20/2020, 04/13/2021  . PFIZER(Purple Top)SARS-COV-2 Vaccination 08/04/2019, 08/23/2019  . Td 07/15/2003  . Tdap 01/11/2021  . Zoster Recombinat (Shingrix) 04/27/2019, 09/26/2019   Past Medical History:  Diagnosis Date  . Allergy   . Anemia    after birth of child 15yr ago  . Anxiety and depression    with insomnia  . Blood transfusion without reported diagnosis    x3  . Chronic back pain    ? RA, ? Lupus - reports saw rheumatologist in the past but better now, sees Dr. PTrenton Gammon . Eczema   . Flushing 05/27/2021  .  GERD 02/13/2007   Qualifier: Diagnosis of  By: Jenny Reichmann MD, Hunt Oris   . GERD (gastroesophageal reflux disease)    takes Omeprazole daily, with nausea  . Greater trochanteric bursitis of left hip 03/25/2019   Injected March 25, 2019  . Heart murmur   . Hemorrhoids   . History of kidney stones    passed on her own  . History of migraine    last one a yr ago and takes Zomig prn  . Hot flashes 11/10/2014  . HTN (hypertension) 03/31/2012  . Hx of echocardiogram    Echo (8/15):  EF 55-60%, no RWMA  . IBS (irritable bowel syndrome)    constipation predominant  . Interstitial cystitis    hx of UTI  . Irritable bowel syndrome 12/14/2009   Qualifier: Diagnosis of  By: Carlean Purl MD, Dimas Millin Memory loss 12/22/2016  . Numbness    both legs and related to back  . Osteoporosis    osteopenia  . Palpitations    on metoprolol in past but made BP too low  . Pneumonia    hx of;last time 65yr ago  . PONV  (postoperative nausea and vomiting)    laryngospasm-1999  . Poor vision 10/15/2015   -? Ocular rosacea -sees opthomology   . Raynaud's disease /phenomenon 10/21/2012   no meds, worse in the winter or cold environment.   . Rheumatoid aortitis    uses Diclofenac gel;Rheumatoid  . Seasonal allergies    Flonase daily   . Strain of left piriformis muscle 05/18/2019  . Urinary incontinence    Allergies  Allergen Reactions  . Aspartame And Phenylalanine Other (See Comments)    Migraines  . Beef-Derived Products Other (See Comments)    severe cramping  . Citrus Other (See Comments)    Causes Interstitial cystitis flares  . Penicillins Hives and Rash    Denies airway involvement Has patient had a PCN reaction causing immediate rash, facial/tongue/throat swelling, SOB or lightheadedness with hypotension: yes hives.  Has patient had a PCN reaction causing severe rash involving mucus membranes or skin necrosis:no Has patient had a PCN reaction that required hospitalization No Has patient had a PCN reaction occurring within the last 10 years: No If all of the above answers are "NO", then may proceed with Cephalosporin use.   . Promethazine Hcl Other (See Comments)    Muscle twitching and contracture   . Beta Vulgaris Other (See Comments)    beets  . Adhesive [Tape] Other (See Comments)    Red splotches  . Doxycycline Nausea And Vomiting  . Morphine Sulfate Nausea And Vomiting   Past Surgical History:  Procedure Laterality Date  . ABDOMINAL HYSTERECTOMY  1997  . BREAST BIOPSY  1999   benign  . COLONOSCOPY  02/11/2013 and 12/24/04   internal and external hemorrhoids  . ENDOMETRIAL ABLATION    . EXCISION MORTON'S NEUROMA Right   . FLEXIBLE SIGMOIDOSCOPY  03/07/2008   internal and external hemorrhoids  . POSTERIOR LUMBAR FUSION  05/02/2013   L4-L5 fusion (cage/screws/bone graft) Dr. PAnnette Stable . TONSILLECTOMY    . UPPER GASTROINTESTINAL ENDOSCOPY  10/23/2010   gastritis, irregular Z-line  .  wisdom teeth extracted     Family History  Problem Relation Age of Onset  . Coronary artery disease Father   . Heart attack Father   . Hypertension Father   . Depression Father   . Hyperlipidemia Father   . Alcohol abuse Father   . Early  death Father   . Kidney disease Father   . Mental illness Father   . Diverticulosis Mother   . Hypertension Mother   . Osteoarthritis Mother   . Depression Mother   . Hyperlipidemia Mother   . Kidney disease Mother   . Alcohol abuse Sister   . Depression Sister   . Diabetes Paternal Uncle   . Hypertension Maternal Grandmother   . Stroke Maternal Grandmother   . Hypertension Maternal Grandfather   . Pancreatic cancer Maternal Grandfather   . Early death Maternal Grandfather   . Stroke Paternal Grandfather   . Hyperlipidemia Paternal Grandfather   . Alcohol abuse Paternal Grandfather   . Early death Paternal Grandfather   . Hypertension Paternal Grandfather   . Depression Paternal Grandmother   . Dementia Paternal Grandmother   . Arthritis Paternal Grandmother   . Mental illness Paternal Grandmother   . Alcohol abuse Daughter   . Depression Daughter   . Drug abuse Daughter   . Alcohol abuse Son   . Depression Son   . Colon cancer Neg Hx    Social History   Social History Narrative   Spiritual Beliefs: methodist   Marital status/children/pets: In second marriage.  First husband committed suicide.  2 children.   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   Safety:      -Wears a bicycle helmet riding a bike: Yes     -smoke alarm in the home:No     - wears seatbelt: Yes     - Feels safe in their relationships: Yes             Allergies as of 02/14/2022       Reactions   Aspartame And Phenylalanine Other (See Comments)   Migraines   Beef-derived Products Other (See Comments)   severe cramping   Citrus Other (See Comments)   Causes Interstitial cystitis flares   Penicillins Hives, Rash   Denies airway  involvement Has patient had a PCN reaction causing immediate rash, facial/tongue/throat swelling, SOB or lightheadedness with hypotension: yes hives.  Has patient had a PCN reaction causing severe rash involving mucus membranes or skin necrosis:no Has patient had a PCN reaction that required hospitalization No Has patient had a PCN reaction occurring within the last 10 years: No If all of the above answers are "NO", then may proceed with Cephalosporin use.   Promethazine Hcl Other (See Comments)   Muscle twitching and contracture    Beta Vulgaris Other (See Comments)   beets   Adhesive [tape] Other (See Comments)   Red splotches   Doxycycline Nausea And Vomiting   Morphine Sulfate Nausea And Vomiting        Medication List        Accurate as of February 14, 2022 12:06 PM. If you have any questions, ask your nurse or doctor.          ALPRAZolam 0.5 MG tablet Commonly known as: Xanax Take 1 tablet (0.5 mg total) by mouth 3 (three) times daily as needed for anxiety.   amLODipine 2.5 MG tablet Commonly known as: NORVASC Take 1 tablet (2.5 mg total) by mouth daily.   atenolol 25 MG tablet Commonly known as: TENORMIN Take 1 tablet (25 mg total) by mouth daily.   B-D 3CC LUER-LOK SYR 25GX1" 25G X 1" 3 ML Misc Generic drug: SYRINGE-NEEDLE (DISP) 3 ML USE WITH CYANOCOBALAMIN EVERY 14 DAYS   B-D 3CC LUER-LOK SYR 25GX1" 25G X 1" 3 ML Misc Generic drug:  SYRINGE-NEEDLE (DISP) 3 ML Use as directed to inject b-12 into the skin   cholecalciferol 1000 units tablet Commonly known as: VITAMIN D Take 1,000 Units by mouth every evening.   clobetasol 0.05 % external solution Commonly known as: TEMOVATE APPLY TOPICALLY ONCE DAILY AS NEEDED FOR DERMATITIS   Dodex 1000 MCG/ML injection Generic drug: cyanocobalamin INJECT 1 ML INTO THE MUSCLE EVERY 14 DAYS   FLUoxetine 20 MG capsule Commonly known as: PROzac Take 1 capsule by mouth daily.   fluticasone 50 MCG/ACT nasal  spray Commonly known as: FLONASE Place 2 sprays into the nose daily.   lamoTRIgine 200 MG tablet Commonly known as: LAMICTAL Take 1 tablet (200 mg total) by mouth at bedtime.   Proctozone-HC 2.5 % rectal cream Generic drug: hydrocortisone Insert rectally 2 times daily for 10 days        All past medical history, surgical history, allergies, family history, immunizations andmedications were updated in the EMR today and reviewed under the history and medication portions of their EMR.      ROS: 14 pt review of systems performed and negative (unless mentioned in an HPI)  Objective: BP 117/66   Pulse (!) 58   Temp 98.2 F (36.8 C) (Oral)   Ht '5\' 2"'$  (1.575 m)   Wt 116 lb (52.6 kg)   SpO2 97%   BMI 21.22 kg/m  Physical Exam Vitals and nursing note reviewed.  Constitutional:      General: She is not in acute distress.    Appearance: Normal appearance. She is normal weight. She is not ill-appearing or toxic-appearing.  Eyes:     Extraocular Movements: Extraocular movements intact.     Conjunctiva/sclera: Conjunctivae normal.     Pupils: Pupils are equal, round, and reactive to light.  Cardiovascular:     Rate and Rhythm: Normal rate and regular rhythm.     Heart sounds: No murmur heard. Pulmonary:     Effort: Pulmonary effort is normal.     Breath sounds: Normal breath sounds.  Musculoskeletal:     Right lower leg: No edema.     Left lower leg: No edema.  Skin:    Findings: No rash.  Neurological:     Mental Status: She is alert and oriented to person, place, and time. Mental status is at baseline.  Psychiatric:        Mood and Affect: Mood normal.        Behavior: Behavior normal.        Thought Content: Thought content normal.        Judgment: Judgment normal.   No results found.  Assessment/plan: [name] is a 66 y.o. female present for   Hypertension/palpitations/anxiety Stable Continue atenolol 25 mg daily as needed Continue amlodipine  2.5 Psychiatry team prescribes Prozac, Xanax and Lamictal. Follow-up per routine schedule every 6 months   No follow-ups on file.    No orders of the defined types were placed in this encounter.   Meds ordered this encounter  Medications  . amLODipine (NORVASC) 2.5 MG tablet    Sig: Take 1 tablet (2.5 mg total) by mouth daily.    Dispense:  90 tablet    Refill:  1    Referral Orders  No referral(s) requested today     Electronically signed by: Howard Pouch, [place]

## 2022-02-24 ENCOUNTER — Ambulatory Visit: Payer: 59 | Admitting: Family Medicine

## 2022-02-27 ENCOUNTER — Ambulatory Visit (INDEPENDENT_AMBULATORY_CARE_PROVIDER_SITE_OTHER): Payer: 59 | Admitting: Psychiatry

## 2022-02-27 DIAGNOSIS — F411 Generalized anxiety disorder: Secondary | ICD-10-CM | POA: Diagnosis not present

## 2022-02-27 NOTE — Progress Notes (Signed)
[facility] Counselor/Therapist Progress Note  Patient ID: [name], MRN: [id number],    Date: 02/27/2022  Time Spent: 50 minutes   Treatment Type: Individual Therapy  Reported Symptoms: depression, anger, anxious  Mental Status Exam:  Appearance:   Casual     Behavior:  Appropriate, Sharing, and Motivated  Motor:  Normal  Speech/Language:   Clear and Coherent  Affect:  Anxious, depressed  Mood:  anxious, depressed  Thought process:  goal directed  Thought content:    overthinking  Sensory/Perceptual disturbances:    WNL  Orientation:  oriented to person, place, time/date, situation, day of week, month of year, year, and stated date of February 27, 2022  Attention:  Good  Concentration:  Good  Memory:  WNL  Fund of knowledge:   Good  Insight:    Good  Judgment:   Good  Impulse Control:  Good   Risk Assessment: Danger to Self:  No Self-injurious Behavior: No Danger to Others: No Duty to Warn:no Physical Aggression / Violence:No  Access to Firearms a concern: No  Gang Involvement:No   Subjective: Patient in today reporting anxiety, frustration, and depression regarding personal and marital issues that has been building some and led to a very difficult situation at home last night. Patient needed session to process the interaction and conversation between she and husband last night. (Not all details included in this note due to patient privacy concerns.)  Denies any thoughts to harm self or others. Was able to talk through how she wants to get through this evening and the next couple of days, including communication with husband and also her own self-care and what is and her best interest at this point.  Has an appointment already scheduled follow-up with this therapist next week and knows that she can call before then if needed.  Reviewed some of the stress and anger management skills that we have talked about previously.  Was calmer and more grounded by end of  session and after talking through a lot of difficult details.  Interventions: Cognitive Behavioral Therapy  Treatment Goals: Goals remain on treatment plan as patient works on strategies to meet her goals.  Progress will be noted each session and documented in "Progress" section of treatment plan. Long term goal: Develop healthy cognitive patterns and beliefs about self and the world that lead to alleviation of depression and anxiety and help prevent relapse.  Progressing: Patient is motivated. Struggling to focus some due to immediate, unexpected health concern with husband.  Short term goal: Learn and implement personal skills for managing stress, solving daily problems, and resolving conflicts effectively. Strategy: Teach patient calming skills, problem solving skills, and conflict resolution skills, to better manage daily stressors    Diagnosis:   ICD-10-CM   1. Generalized anxiety disorder  F41.1      Plan: Patient today showing active participation in session although her mood was more anxious/depressed.  Mood was some better by the end of session after talking through some difficult situation that occurred at patient's home today.  Patient did well and venting and receiving support and also looking at how and what she wants to do most immediately.  Encouraged her to try and get some good rest as well as other positive self-care strategies including being in touch with closer supportive friends depending on what feels best to her.  Still denies any thoughts to harm self or others and does seem to be in a better place emotionally than  when she arrived.  (As noted above, not all details provided in this note due to patient confidentiality needs.)  Will follow with patient next week. Encouraged patient in her practice of positive behaviors including: Focus more on her own self-care, staying in touch with people that are supportive, work on letting go of negativity and over worrying in order  to see more positives and experience more hope, believing in herself more that she can make certain changes to influence her life more positively especially within the family and at work, continue spending time outdoors in activities she enjoys especially biking, take breaks as needed to experience more calmness, intentionally look for more positives daily, see the positives within herself and be able to name them, maintain healthy boundaries with others, reflect on progress made more often, and realize the strength she shows in working with goal directed behaviors to move in a direction that supports her improved emotional health and outlook.  Goal review and progress/challenges noted with patient.  Next appointment within 2 to 3 weeks.  This record has been created using [employer].  Chart creation errors have been sought, but may not always have been located and corrected.  Such creation errors do not reflect on the standard of medical care provided.   Shanon Ace, LCSW

## 2022-03-03 ENCOUNTER — Ambulatory Visit (INDEPENDENT_AMBULATORY_CARE_PROVIDER_SITE_OTHER): Payer: 59 | Admitting: Psychiatry

## 2022-03-03 DIAGNOSIS — F411 Generalized anxiety disorder: Secondary | ICD-10-CM

## 2022-03-03 NOTE — Progress Notes (Signed)
[facility] Counselor/Therapist Progress Note  Patient ID: [name], MRN: [id number],    Date: 03/03/2022  Time Spent: 55 minutes   Treatment Type: Individual Therapy  Reported Symptoms: anxiety, some sleep difficulty last night but my Xanax usually helps  Mental Status Exam:  Appearance:   Casual     Behavior:  Appropriate, Sharing, and Motivated  Motor:  Normal  Speech/Language:   Clear and Coherent  Affect:  anxious  Mood:  anxious  Thought process:  goal directed  Thought content:    WNL  Sensory/Perceptual disturbances:    WNL  Orientation:  oriented to person, place, time/date, situation, day of week, month of year, year, and stated date of March 03, 2022  Attention:  Good  Concentration:  Good  Memory:  WNL  Fund of knowledge:   Good  Insight:    Good  Judgment:   Good  Impulse Control:  Good   Risk Assessment: Danger to Self:  No Self-injurious Behavior: No Danger to Others: No Duty to Warn:no Physical Aggression / Violence:No  Access to Firearms a concern: No  Gang Involvement:No   Subjective: Patient in today reporting anxiety as main symptom, with some recent sleep issues but "my Xanax usually helps that although last night only slept 5 hrs.  Needed session today to work on communication strategies in her marriage, including tone of voice, blaming, word choices, being more approachable, and also some anger management.  Feeling some improved hope about her marriage and wants to work on her communication issues and not rushing to judgment in situations at home. To continue work on anger management using some specific skill discussed today, along with communication skills also practiced/discussed.  Interventions: Cognitive Behavioral Therapy  Long term goal: Develop healthy cognitive patterns and beliefs about self and the world that lead to alleviation of depression and anxiety and help prevent relapse.  Progressing: Patient is motivated.  Struggling to focus some due to immediate, unexpected health concern with husband.  Short term goal: Learn and implement personal skills for managing stress, solving daily problems, and resolving conflicts effectively. Strategy: Teach patient calming skills, problem solving skills, and conflict resolution skills, to better manage daily stressors  Diagnosis:   ICD-10-CM   1. Generalized anxiety disorder  F41.1       Plan: Patient today showing good motivation and active participation in working on her anxiety that relates heavily into some marital issues.  Husband has refused to go for marital counseling, but patient added today that she thought that counseling occurred in his first marriage as she and husband have never been to marital counseling.  Discussed this more with patient along with some positive self-care strategies for her and better communication strategies and anger management as noted above.  More motivated today and states that they did have a better few days at home recently after last session, "not perfect but better".  Helped patient with some responses to certain situations in her marital communication where she can let her partner know that she is not always responding right in the moment "because she is not sure what to say that would be helpful at that time."  Discussed this more with patient as she felt she strongly related to this and needed some help in determining healthier responses.  Encouraged patient to try and allow for more sleep right now during this time of higher stress. Encouraged patient in practicing more positive behaviors including: Remaining in touch with people that  are supportive, focus on her own self-care, work to let go of negativity and over worrying in order to see more positives and experience more hope to move forward, believing in herself more that she can make certain changes to influence her life and positive directions especially within her family and  at work, continue spending time outdoors in activities she enjoys including biking, take breaks as needed to experience more calmness, intentionally look for more positives each day, be aware of the positives within herself and be able to name them, maintain healthy boundaries with others, notice the progress she has made, and recognize the strength she shows in working with goal directed behaviors to move in a direction that supports her improved emotional health.  Goal review and progress/challenges noted with patient.  Next appointment within 2 to 3 weeks.  This record has been created using [employer].  Chart creation errors have been sought, but may not always have been located and corrected.  Such creation errors do not reflect on the standard of medical care provided.   Shanon Ace, LCSW

## 2022-03-11 ENCOUNTER — Ambulatory Visit: Payer: 59 | Admitting: Psychiatry

## 2022-03-26 ENCOUNTER — Ambulatory Visit (INDEPENDENT_AMBULATORY_CARE_PROVIDER_SITE_OTHER): Payer: 59 | Admitting: Psychiatry

## 2022-03-26 DIAGNOSIS — F411 Generalized anxiety disorder: Secondary | ICD-10-CM | POA: Diagnosis not present

## 2022-03-26 NOTE — Progress Notes (Signed)
[facility] Counselor/Therapist Progress Note  Patient ID: [name], MRN: [id number],    Date: 03/26/2022  Time Spent: 55 minutes   Treatment Type: Individual Therapy  Reported Symptoms: anxiety (some improvement), exhausted (due to work)   Mental Status Exam:  Appearance:   Casual and Neat     Behavior:  Appropriate and Motivated  Motor:  Normal  Speech/Language:   Clear and Coherent  Affect:  anxiety  Mood:  anxious  Thought process:  goal directed  Thought content:    overthinking  Sensory/Perceptual disturbances:    WNL  Orientation:  oriented to person, place, time/date, situation, day of week, month of year, year, and stated date of Sept. 13, 2023  Attention:  Good  Concentration:  Good  Memory:  WNL  Fund of knowledge:   Good  Insight:    Good  Judgment:   Good  Impulse Control:  Good   Risk Assessment: Danger to Self:  No Self-injurious Behavior: No Danger to Others: No Duty to Warn:no Physical Aggression / Violence:No  Access to Firearms a concern: No  Gang Involvement:No   Subjective: Patient today reporting anxiety has improved some but having difficulty coping with work stressor and some family challenges. Some improvement in marital relationship. Visited mother in [place] for a couple days and it was a "better visit" but noticed some changes in health and behavior. Discussed marital relationship concerns and worked together on some communication and support strategies that can help her marital relationship and work relationships. Also focused on her over-worrying and trying to let go more in order to sleep better and manage her workdays more effectively.Review of communication strategies within marriage and on the job, including word/phrase choices, not blaming, and tone of voice. Husband did go to weight management center and is following up there, which patient feels really good about and is supporting as well.  Interventions: Cognitive  Behavioral Therapy and Ego-Supportive  Long term goal: Develop healthy cognitive patterns and beliefs about self and the world that lead to alleviation of depression and anxiety and help prevent relapse.  Progressing: Patient is motivated. Struggling to focus some due to immediate, unexpected health concern with husband.  Short term goal: Learn and implement personal skills for managing stress, solving daily problems, and resolving conflicts effectively. Strategy: Teach patient calming skills, problem solving skills, and conflict resolution skills, to better manage daily stressors  Diagnosis:   ICD-10-CM   1. Generalized anxiety disorder  F41.1      Plan: Patient in today showing good motivation and participation in session working further on her anxiety and trying various strategies to improve her feelings about her work, and especially in improving communication and relationship with husband.  Working hours are very stressful and discussed today some stress management and setting some limits for herself as well as positive self talk and other ways of having healthier and more productive self-care.  Is showing progress in working with her anxiety and especially things are out of her control.  Needs continued therapy as her work with goal directed behaviors is helping her to move in a forward direction.  More recently her husband has made significant decisions and taken steps to improve his own health which has been a source of extreme worry for patient.  Trying to plan more things together as well as healthier meal planning.  Patient recognizing that "some things are better, not perfect, but better" and is hoping this trend can continue.  Did  encourage patient to try working with some strategies discussed today in terms of thoughts that keep her awake at night and trying to allow for healthier sleep patterns especially during times of high stress, and to consider checking in with her med provider on  this as well. Encouraged patient in her practice of more positive behaviors as discussed in sessions including: Staying in touch with people that are supportive, working to let go of negativity and over worrying in order to see more positives and experience hope to move forward, believing herself more that she can make certain changes to influence her life in positive directions especially with her family and at work, positive self talk and self-care, continue spending time outdoors in activities she enjoys including biking, take breaks as needed to experience more calmness, intentionally look for more positives each day, be aware of the positives within herself, maintain healthy boundaries with others, be aware of the progress she has made and feels good about that, and realize the strength she shows working with goal directed behaviors to move in a direction that supports her improved emotional health and overall wellbeing.  Goal review and progress/challenges noted with patient.  Next appointment within 2 to 3 weeks.  This record has been created using [employer].  Chart creation errors have been sought, but may not always have been located and corrected.  Such creation errors do not reflect on the standard of medical care provided.   Shanon Ace, LCSW

## 2022-04-07 ENCOUNTER — Encounter: Payer: Self-pay | Admitting: Family Medicine

## 2022-04-07 ENCOUNTER — Ambulatory Visit: Payer: 59 | Admitting: Family Medicine

## 2022-04-07 ENCOUNTER — Other Ambulatory Visit (HOSPITAL_COMMUNITY): Payer: Self-pay

## 2022-04-07 VITALS — BP 145/79 | HR 60 | Temp 98.2°F | Ht 62.0 in | Wt 115.0 lb

## 2022-04-07 DIAGNOSIS — R42 Dizziness and giddiness: Secondary | ICD-10-CM

## 2022-04-07 DIAGNOSIS — T161XXA Foreign body in right ear, initial encounter: Secondary | ICD-10-CM | POA: Diagnosis not present

## 2022-04-07 DIAGNOSIS — R110 Nausea: Secondary | ICD-10-CM | POA: Diagnosis not present

## 2022-04-07 DIAGNOSIS — H9313 Tinnitus, bilateral: Secondary | ICD-10-CM

## 2022-04-07 DIAGNOSIS — Z01118 Encounter for examination of ears and hearing with other abnormal findings: Secondary | ICD-10-CM | POA: Diagnosis not present

## 2022-04-07 MED ORDER — PREDNISONE 20 MG PO TABS
ORAL_TABLET | ORAL | 0 refills | Status: AC
  Filled 2022-04-07: qty 18, 10d supply, fill #0

## 2022-04-07 MED ORDER — MECLIZINE HCL 25 MG PO TABS
25.0000 mg | ORAL_TABLET | Freq: Three times a day (TID) | ORAL | 0 refills | Status: DC | PRN
  Filled 2022-04-07: qty 30, 10d supply, fill #0

## 2022-04-07 MED ORDER — ONDANSETRON HCL 4 MG PO TABS
4.0000 mg | ORAL_TABLET | Freq: Three times a day (TID) | ORAL | 0 refills | Status: DC | PRN
  Filled 2022-04-07: qty 20, 7d supply, fill #0

## 2022-04-07 MED ORDER — ONDANSETRON HCL 4 MG/2ML IJ SOLN
4.0000 mg | Freq: Once | INTRAMUSCULAR | Status: AC
  Administered 2022-04-07: 4 mg via INTRAMUSCULAR

## 2022-04-07 NOTE — Patient Instructions (Signed)
How to Perform the Epley Maneuver The Epley maneuver is an exercise that relieves symptoms of vertigo. Vertigo is the feeling that you or your surroundings are moving when they are not. When you feel vertigo, you may feel like the room is spinning and may have trouble walking. The Epley maneuver is used for a type of vertigo caused by a calcium deposit in a part of the inner ear. The maneuver involves changing head positions to help the deposit move out of the area. You can do this maneuver at home whenever you have symptoms of vertigo. You can repeat it in 24 hours if your vertigo has not gone away. Even though the Epley maneuver may relieve your vertigo for a few weeks, it is possible that your symptoms will return. This maneuver relieves vertigo, but it does not relieve dizziness. What are the risks? If it is done correctly, the Epley maneuver is considered safe. Sometimes it can lead to dizziness or nausea that goes away after a short time. If you develop other symptoms--such as changes in vision, weakness, or numbness--stop doing the maneuver and call your health care provider. Supplies needed: A bed or table. A pillow. How to do the Epley maneuver     Sit on the edge of a bed or table with your back straight and your legs extended or hanging over the edge of the bed or table. Turn your head halfway toward the affected ear or side as told by your health care provider. Lie backward quickly with your head turned until you are lying flat on your back. Your head should dangle (head-hanging position). You may want to position a pillow under your shoulders. Hold this position for at least 30 seconds. If you feel dizzy or have symptoms of vertigo, continue to hold the position until the symptoms stop. Turn your head to the opposite direction until your unaffected ear is facing down. Your head should continue to dangle. Hold this position for at least 30 seconds. If you feel dizzy or have symptoms of  vertigo, continue to hold the position until the symptoms stop. Turn your whole body to the same side as your head so that you are positioned on your side. Your head will now be nearly facedown and no longer needs to dangle. Hold for at least 30 seconds. If you feel dizzy or have symptoms of vertigo, continue to hold the position until the symptoms stop. Sit back up. You can repeat the maneuver in 24 hours if your vertigo does not go away. Follow these instructions at home: For 24 hours after doing the Epley maneuver: Keep your head in an upright position. When lying down to sleep or rest, keep your head raised (elevated) with two or more pillows. Avoid excessive neck movements. Activity Do not drive or use machinery if you feel dizzy. After doing the Epley maneuver, return to your normal activities as told by your health care provider. Ask your health care provider what activities are safe for you. General instructions Drink enough fluid to keep your urine pale yellow. Do not drink alcohol. Take over-the-counter and prescription medicines only as told by your health care provider. Keep all follow-up visits. This is important. Preventing vertigo symptoms Ask your health care provider if there is anything you should do at home to prevent vertigo. He or she may recommend that you: Keep your head elevated with two or more pillows while you sleep. Do not sleep on the side of your affected ear. Get   up slowly from bed. Avoid sudden movements during the day. Avoid extreme head positions or movement, such as looking up or bending over. Contact a health care provider if: Your vertigo gets worse. You have other symptoms, including: Nausea. Vomiting. Headache. Get help right away if you: Have vision changes. Have a headache or neck pain that is severe or getting worse. Cannot stop vomiting. Have new numbness or weakness in any part of your body. These symptoms may represent a serious problem  that is an emergency. Do not wait to see if the symptoms will go away. Get medical help right away. Call your local emergency services (911 in the [place] Do not drive yourself to the hospital. Summary Vertigo is the feeling that you or your surroundings are moving when they are not. The Epley maneuver is an exercise that relieves symptoms of vertigo. If the Epley maneuver is done correctly, it is considered safe. This information is not intended to replace advice given to you by your health care provider. Make sure you discuss any questions you have with your health care provider. Document Revised: 05/30/2020 Document Reviewed: 05/30/2020 [employer] Patient Education  2023 [employer]  

## 2022-04-07 NOTE — Progress Notes (Unsigned)
[name] , May 11, 1956, 66 y.o., female MRN: [id number] Patient Care Team    Relationship Specialty Notifications Start End  Ma Hillock, DO PCP - General Family Medicine  12/29/18   Gavin Pound, MD Consulting Physician Rheumatology  04/09/17   Maisie Fus, MD (Inactive) Consulting Physician Obstetrics and Gynecology  04/09/17   Devra Dopp, MD Referring Physician Dermatology  04/09/17   Group, [place]  12/29/18    Comment: [facility]    12/29/18   [name], MD Consulting Physician Gastroenterology  12/29/18   Earnie Larsson, MD Consulting Physician Neurosurgery  12/29/18     Chief Complaint  Patient presents with   Ear Fullness    Pt c/o R ear fullness, dizziness when change from sitting to stand and turn head, impaired balance, HA, nausea, changes in vision x 4 days gradually worsening; pt has an appt with eye dr end of Oct.     Subjective: Pt presents for an OV with complaints of right ear fullness, dizziness with changing positions.  Dizziness with turning head.  She reports room spinning symptoms not passing out symptoms.  She complains of nausea without vomit.  She has had blurry vision off and on and has an appointment with her eye doctor.  She does have cataracts and they feel this could be causing her vision changes.  Patient denies any acute illness. Per chart she has a history of mild vertigo going back to as early as 2010. She reports there is ringing in her ears.  Symptoms last a few seconds and then resolved.  Occurred multiple times a day.  Her EMR review many years ago there was concern for possible MS, but this was ruled out per patient.    01/01/2022    2:16 PM 05/27/2021   10:37 AM 03/29/2021   11:34 AM 05/23/2020    9:40 AM 04/27/2019    9:38 AM  Depression screen PHQ 2/9  Decreased Interest 0 0 0 0 0  Down, Depressed, Hopeless 0 0 0 0 0  PHQ - 2 Score 0 0 0 0 0   Altered sleeping 1  0    Tired, decreased energy 0  0    Change in appetite 0  0    Feeling bad or failure about yourself  0  0    Trouble concentrating 0  0    Moving slowly or fidgety/restless 0  0    Suicidal thoughts 0  0    PHQ-9 Score 1  0    Difficult doing work/chores   Not difficult at all      Allergies  Allergen Reactions   Aspartame And Phenylalanine Other (See Comments)    Migraines   Beef-Derived Products Other (See Comments)    severe cramping   Citrus Other (See Comments)    Causes Interstitial cystitis flares   Penicillins Hives and Rash    Denies airway involvement Has patient had a PCN reaction causing immediate rash, facial/tongue/throat swelling, SOB or lightheadedness with hypotension: yes hives.  Has patient had a PCN reaction causing severe rash involving mucus membranes or skin necrosis:no Has patient had a PCN reaction that required hospitalization No Has patient had a PCN reaction occurring within the last 10 years: No If all of the above answers are "NO", then may proceed with Cephalosporin use.    Promethazine Hcl Other (See Comments)    Muscle  twitching and contracture    Beta Vulgaris Other (See Comments)    beets   Adhesive [Tape] Other (See Comments)    Red splotches   Doxycycline Nausea And Vomiting   Morphine Sulfate Nausea And Vomiting   Social History   Social History Narrative   Spiritual Beliefs: methodist   Marital status/children/pets: In second marriage.  First husband committed suicide.  2 children.   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 RA, ? Lupus - reports saw rheumatologist in the past but better now, sees Dr. PTrenton Gammon  Eczema    Flushing 05/27/2021   GERD 02/13/2007   Qualifier: Diagnosis of  By: JJenny ReichmannMD, JHunt Oris   GERD (gastroesophageal reflux disease)    takes Omeprazole daily, with nausea   Greater trochanteric bursitis of left hip 03/25/2019   Injected March 25, 2019   Heart murmur    Hemorrhoids    History of kidney stones    passed on her own   History of migraine    last one a yr ago and takes Zomig prn   Hot flashes 11/10/2014   HTN (hypertension) 03/31/2012   Hx of echocardiogram    Echo (8/15):  EF 55-60%, no RWMA   IBS (irritable bowel syndrome)    constipation predominant   Interstitial cystitis    hx of UTI   Irritable bowel syndrome 12/14/2009   Qualifier: Diagnosis of  By: GCarlean PurlMD, FDimas Millin   Memory loss 12/22/2016   Numbness    both legs and related to back   Osteoporosis    osteopenia   Palpitations    on metoprolol in past but made BP too low   Pneumonia    hx of;last time 16yrago   PONV (postoperative nausea and vomiting)    laryngospasm-1999   Poor vision 10/15/2015   -? Ocular rosacea -sees opthomology    Raynaud's disease /phenomenon 10/21/2012   no meds, worse in the winter or cold environment.    Rheumatoid aortitis    uses Diclofenac gel;Rheumatoid   Seasonal allergies    Flonase daily    Strain of left piriformis muscle 05/18/2019   Urinary incontinence    Past Surgical History:  Procedure Laterality Date   ABDOMINAL HYSTERECTOMY  1997   BREAST BIOPSY  1999   benign   COLONOSCOPY  02/11/2013 and 12/24/04   internal and external hemorrhoids   ENDOMETRIAL ABLATION     EXCISION MORTON'S NEUROMA Right    FLEXIBLE SIGMOIDOSCOPY  03/07/2008   internal and external hemorrhoids   POSTERIOR LUMBAR FUSION  05/02/2013   L4-L5 fusion (cage/screws/bone graft) Dr. PoAnnette Stable TONSILLECTOMY     UPPER GASTROINTESTINAL ENDOSCOPY  10/23/2010   gastritis,  irregular Z-line   wisdom teeth extracted     Family History  Problem Relation Age of Onset   Coronary artery disease  Father    Heart attack Father    Hypertension Father    Depression Father    Hyperlipidemia Father    Alcohol abuse Father    Early death Father    Kidney disease Father    Mental illness Father    Diverticulosis Mother    Hypertension Mother    Osteoarthritis Mother    Depression Mother    Hyperlipidemia Mother    Kidney disease Mother    Alcohol abuse Sister    Depression Sister    Diabetes Paternal Uncle    Hypertension Maternal Grandmother    Stroke Maternal Grandmother    Hypertension Maternal Grandfather    Pancreatic cancer Maternal Grandfather    Early death Maternal Grandfather    Stroke Paternal Grandfather    Hyperlipidemia Paternal Grandfather    Alcohol abuse Paternal Grandfather    Early death Paternal Grandfather    Hypertension Paternal Grandfather    Depression Paternal Grandmother    Dementia Paternal Grandmother    Arthritis Paternal Grandmother    Mental illness Paternal Grandmother    Alcohol abuse Daughter    Depression Daughter    Drug abuse Daughter    Alcohol abuse Son    Depression Son    Colon cancer Neg Hx    Allergies as of 04/07/2022       Reactions   Aspartame And Phenylalanine Other (See Comments)   Migraines   Beef-derived Products Other (See Comments)   severe cramping   Citrus Other (See Comments)   Causes Interstitial cystitis flares   Penicillins Hives, Rash   Denies airway involvement Has patient had a PCN reaction causing immediate rash, facial/tongue/throat swelling, SOB or lightheadedness with hypotension: yes hives.  Has patient had a PCN reaction causing severe rash involving mucus membranes or skin necrosis:no Has patient had a PCN reaction that required hospitalization No Has patient had a PCN reaction occurring within the last 10 years: No If all of the above answers are "NO", then may proceed with  Cephalosporin use.   Promethazine Hcl Other (See Comments)   Muscle twitching and contracture    Beta Vulgaris Other (See Comments)   beets   Adhesive [tape] Other (See Comments)   Red splotches   Doxycycline Nausea And Vomiting   Morphine Sulfate Nausea And Vomiting        Medication List        Accurate as of April 07, 2022 11:59 PM. If you have any questions, ask your nurse or doctor.          ALPRAZolam 0.5 MG tablet Commonly known as: Xanax Take 1 tablet (0.5 mg total) by mouth 3 (three) times daily as needed for anxiety.   amLODipine 2.5 MG tablet Commonly known as: NORVASC Take 1 tablet (2.5 mg total) by mouth daily.   atenolol 25 MG tablet Commonly known as: TENORMIN Take 1 tablet (25 mg total) by mouth daily.   B-D 3CC LUER-LOK SYR 25GX1" 25G X 1" 3 ML Misc Generic drug: SYRINGE-NEEDLE (DISP) 3 ML USE WITH CYANOCOBALAMIN EVERY 14 DAYS   B-D 3CC LUER-LOK SYR 25GX1" 25G X 1" 3 ML Misc Generic drug: SYRINGE-NEEDLE (DISP) 3 ML Use as directed to inject b-12 into the skin   cholecalciferol 1000 units tablet Commonly known as: VITAMIN D Take 1,000 Units by mouth every evening.   clobetasol 0.05 % external solution Commonly known as: TEMOVATE APPLY TOPICALLY ONCE DAILY AS NEEDED FOR DERMATITIS   Dodex 1000 MCG/ML injection Generic drug: cyanocobalamin  INJECT 1 ML INTO THE MUSCLE EVERY 14 DAYS   FLUoxetine 20 MG capsule Commonly known as: PROzac Take 1 capsule by mouth daily.   fluticasone 50 MCG/ACT nasal spray Commonly known as: FLONASE Place 2 sprays into the nose daily.   lamoTRIgine 200 MG tablet Commonly known as: LAMICTAL Take 1 tablet (200 mg total) by mouth at bedtime.   meclizine 25 MG tablet Commonly known as: ANTIVERT Take 1 tablet (25 mg total) by mouth 3 (three) times daily as needed for dizziness. Started by: Howard Pouch, DO   ondansetron 4 MG tablet Commonly known as: ZOFRAN Take 1 tablet (4 mg total) by mouth every 8  (eight) hours as needed for nausea or vomiting. Started by: Howard Pouch, DO   predniSONE 20 MG tablet Commonly known as: DELTASONE Take 3 tablets (60 mg total) by mouth daily for 3 days, THEN 2 tablets (40 mg total) daily for 3 days, THEN 1 tablet (20 mg total) daily for 2 days, THEN 0.5 tablets (10 mg total) daily for 2 days. Start taking on: April 07, 2022 Started by: Howard Pouch, DO   Proctozone-HC 2.5 % rectal cream Generic drug: hydrocortisone Insert rectally 2 times daily for 10 days        All past medical history, surgical history, allergies, family history, immunizations andmedications were updated in the EMR today and reviewed under the history and medication portions of their EMR.     ROS Negative, with the exception of above mentioned in HPI   Objective:  BP (!) 145/79 (Patient Position: Standing)   Pulse 60   Temp 98.2 F (36.8 C) (Oral)   Ht '5\' 2"'$  (1.575 m)   Wt 115 lb (52.2 kg)   SpO2 99%   BMI 21.03 kg/m  Body mass index is 21.03 kg/m. Physical Exam Vitals and nursing note reviewed.  Constitutional:      General: She is not in acute distress.    Appearance: Normal appearance. She is normal weight. She is not ill-appearing or toxic-appearing.  HENT:     Head: Normocephalic and atraumatic.     Right Ear: External ear normal. No middle ear effusion. A foreign body is present. No mastoid tenderness. Tympanic membrane is not injected, scarred, perforated, erythematous or bulging.     Left Ear: Ear canal and external ear normal.  No middle ear effusion. No foreign body. No mastoid tenderness. Tympanic membrane is not injected, scarred, perforated, erythematous, retracted or bulging.     Ears:      Comments: Right: Debris/hair in canal against TM> lavaged Left: small, round yellow mass behind TM. > lavage of canal reproduced vertigo.  Eyes:     Extraocular Movements: Extraocular movements intact.     Conjunctiva/sclera: Conjunctivae normal.      Pupils: Pupils are equal, round, and reactive to light.  Neurological:     Mental Status: She is alert and oriented to person, place, and time. Mental status is at baseline.     Motor: No weakness.     Coordination: Coordination normal.     Gait: Gait normal.  Psychiatric:        Mood and Affect: Mood normal.        Behavior: Behavior normal.        Thought Content: Thought content normal.        Judgment: Judgment normal.     No results found. No results found. No results found for this or any previous visit (from the past 24 hour(s)).  Assessment/Plan: ALAISA MOFFITT is a 66 y.o. female present for OV for  Nausea/vertigo Meclizine, prednisone taper and Zofran prescribed. - ondansetron (ZOFRAN) injection 4 mg provided in office - Ambulatory referral to ENT Procedure: Debris/foreign body removal from auditory canal with lavage Patient was verbally consented to procedure. Water-peroxide solution was applied and gentle ear lavage performed on bilateral ear(s).  There were no complications.  Tympanic membrane was visualized after lavage.  Tympanic membrane(s) intact.  Auditory canal(s) normal.  Patient tolerated procedure well.  Patient reported symptoms were reproduced when left ear lavage was performed.  Abnormal exam of left ear/tinnitus ?  Cholesteatoma - Ambulatory referral to ENT    Reviewed expectations re: course of current medical issues. Discussed self-management of symptoms. Outlined signs and symptoms indicating need for more acute intervention. Patient verbalized understanding and all questions were answered. Patient received an After-Visit Summary.    Orders Placed This Encounter  Procedures   Ambulatory referral to ENT   Meds ordered this encounter  Medications   ondansetron (ZOFRAN) injection 4 mg   predniSONE (DELTASONE) 20 MG tablet    Sig: Take 3 tablets (60 mg total) by mouth daily for 3 days, THEN 2 tablets (40 mg total) daily for 3 days, THEN 1  tablet (20 mg total) daily for 2 days, THEN 0.5 tablets (10 mg total) daily for 2 days.    Dispense:  18 tablet    Refill:  0   meclizine (ANTIVERT) 25 MG tablet    Sig: Take 1 tablet (25 mg total) by mouth 3 (three) times daily as needed for dizziness.    Dispense:  30 tablet    Refill:  0   ondansetron (ZOFRAN) 4 MG tablet    Sig: Take 1 tablet (4 mg total) by mouth every 8 (eight) hours as needed for nausea or vomiting.    Dispense:  20 tablet    Refill:  0   Referral Orders         Ambulatory referral to ENT       Note is dictated utilizing voice recognition software. Although note has been proof read prior to signing, occasional typographical errors still can be missed. If any questions arise, please do not hesitate to call for verification.   electronically signed by:  Howard Pouch, DO  [place]

## 2022-04-08 ENCOUNTER — Ambulatory Visit: Payer: 59 | Admitting: Psychiatry

## 2022-04-11 ENCOUNTER — Encounter: Payer: Self-pay | Admitting: Family Medicine

## 2022-04-11 DIAGNOSIS — R42 Dizziness and giddiness: Secondary | ICD-10-CM

## 2022-04-17 ENCOUNTER — Other Ambulatory Visit: Payer: Self-pay | Admitting: Family Medicine

## 2022-04-17 DIAGNOSIS — Z1231 Encounter for screening mammogram for malignant neoplasm of breast: Secondary | ICD-10-CM

## 2022-04-17 NOTE — Telephone Encounter (Signed)
Would encourage her to call the ENTs office and see if she can be put on a cancellation list to be bumped up if able. I have also placed a referral to physical therapy in [facility] to start vestibular rehab to see if they can help as well.

## 2022-04-21 ENCOUNTER — Other Ambulatory Visit (HOSPITAL_COMMUNITY): Payer: Self-pay

## 2022-04-21 ENCOUNTER — Ambulatory Visit (INDEPENDENT_AMBULATORY_CARE_PROVIDER_SITE_OTHER): Payer: 59 | Admitting: Psychiatry

## 2022-04-21 DIAGNOSIS — F411 Generalized anxiety disorder: Secondary | ICD-10-CM | POA: Diagnosis not present

## 2022-04-21 NOTE — Progress Notes (Signed)
[facility] Counselor/Therapist Progress Note  Patient ID: [name], MRN: [id number],    Date: 04/21/2022  Time Spent: 55 minutes   Treatment Type: Individual Therapy  Reported Symptoms: anxiety,   Mental Status Exam:  Appearance:   Casual     Behavior:  Appropriate, Sharing, and Motivated  Motor:  Normal  Speech/Language:   Clear and Coherent  Affect:  Anxious, some depression  Mood:  anxious and depressed  Thought process:  goal directed  Thought content:    Some obsessiveness in thoughts re: past, future, present  Sensory/Perceptual disturbances:    WNL  Orientation:  oriented to person, place, time/date, situation, day of week, month of year, year, and stated date of April 21, 2022  Attention:  Good  Concentration:  Good  Memory:  WNL  Fund of knowledge:   Good  Insight:    Good  Judgment:   Good  Impulse Control:  Good   Risk Assessment: Danger to Self:  No Self-injurious Behavior: No Danger to Others: No Duty to Warn:no Physical Aggression / Violence:No  Access to Firearms a concern: No  Gang Involvement:No   Subjective:  Patient in today reporting anxiety, "some depression", and difficulty falling asleep (she states she's tried multiple sleep meds and Xanax "but nothing helps except Xanax helps some." Stressing over some some personal issues which are difficult for patient and processes these in session today. (Not all details included in this note due to patient privacy needs.) Work stressors continue to be a main issue for patient. Family challenges continue also but patient is trying not to assume the worst in these situation and instead cope in healthier ways including not getting stuck in negative thought patterns which we worked on in session today and patient admits it is very difficult. Marital relationship issues discussed some but we ran out of time today and will follow up next session. Over-worrying slightly decreased. Disappointed in  situation (medical) for husband.   Interventions: Cognitive Behavioral Therapy and Ego-Supportive  Long term goal: Develop healthy cognitive patterns and beliefs about self and the world that lead to alleviation of depression and anxiety and help prevent relapse.  Progressing: Patient is motivated. Struggling to focus some due to immediate, unexpected health concern with husband.  Short term goal: Learn and implement personal skills for managing stress, solving daily problems, and resolving conflicts effectively. Strategy: Teach patient calming skills, problem solving skills, and conflict resolution skills, to better manage daily stressors  Diagnosis:   ICD-10-CM   1. Generalized anxiety disorder  F41.1      Plan: Patient in today showing good participation and motivation in session focusing on her anxiety primarily and also some depression regarding family issues, marital concerns, and work stressors.  Patient showed good motivation in working on each of these areas which are challenging for her.  Looking at what she can change versus cannot change.  Job remains very stressful with some uncertainties at times.  Encouraged communication positive skills.  Is showing some improvement in managing things that are out of her control.  Sleep is difficult as she says it is hard to "turn my brain off".  Has tried multiple sleep medicines that she says she is not really benefited from and that her Xanax does sometimes help her get to sleep which she states is her biggest sleep-related problem.  Husband had set back in the weight loss program he was in due to excessive cost.  Patient is still working  on behavioral and communication issues to improve marital relationship. Encouraged patient in her practice of more positive behaviors as noted in sessions including: Remaining in touch with people who are supportive, working on letting go of negativity and over worrying in order to see more positives and  experience hope to move forward, believing in herself more that she can make certain changes to influence her life in positive directions especially within her family and at work, positive self talk and self-care, continue spending time outdoors in activities she enjoys including biking, take breaks as needed to experience more calmness, intentionally look for more positives daily, be aware of the positives within herself, maintain healthy boundaries with others, be aware of the progress she has made and feels good about that, and recognize the strength she shows working with goal directed behaviors to move in a direction that supports her improved emotional health and outlook.  Goal review and progress/challenges noted with patient.  Next appointment within 2 to 3 weeks.  This record has been created using [employer].  Chart creation errors have been sought, but may not always have been located and corrected.  Such creation errors do not reflect on the standard of medical care provided.    Shanon Ace, LCSW

## 2022-04-22 DIAGNOSIS — H938X1 Other specified disorders of right ear: Secondary | ICD-10-CM | POA: Diagnosis not present

## 2022-04-23 ENCOUNTER — Encounter: Payer: Self-pay | Admitting: Adult Health

## 2022-04-23 ENCOUNTER — Ambulatory Visit (INDEPENDENT_AMBULATORY_CARE_PROVIDER_SITE_OTHER): Payer: 59 | Admitting: Adult Health

## 2022-04-23 ENCOUNTER — Other Ambulatory Visit (HOSPITAL_COMMUNITY): Payer: Self-pay

## 2022-04-23 DIAGNOSIS — F410 Panic disorder [episodic paroxysmal anxiety] without agoraphobia: Secondary | ICD-10-CM

## 2022-04-23 DIAGNOSIS — F4310 Post-traumatic stress disorder, unspecified: Secondary | ICD-10-CM

## 2022-04-23 DIAGNOSIS — F411 Generalized anxiety disorder: Secondary | ICD-10-CM

## 2022-04-23 DIAGNOSIS — G4700 Insomnia, unspecified: Secondary | ICD-10-CM

## 2022-04-23 DIAGNOSIS — F331 Major depressive disorder, recurrent, moderate: Secondary | ICD-10-CM | POA: Diagnosis not present

## 2022-04-23 MED ORDER — LAMOTRIGINE 200 MG PO TABS
200.0000 mg | ORAL_TABLET | Freq: Every day | ORAL | 3 refills | Status: DC
  Filled 2022-04-23 – 2022-08-01 (×2): qty 90, 90d supply, fill #0
  Filled 2022-11-18: qty 90, 90d supply, fill #1
  Filled 2023-02-13: qty 90, 90d supply, fill #2

## 2022-04-23 MED ORDER — SERTRALINE HCL 50 MG PO TABS
50.0000 mg | ORAL_TABLET | Freq: Every day | ORAL | 1 refills | Status: DC
  Filled 2022-04-23: qty 90, 90d supply, fill #0

## 2022-04-23 NOTE — Progress Notes (Signed)
[name] [id number] 12-13-1955 66 y.o.  Subjective:   Patient ID:  [name] is a 66 y.o. (DOB 07/13/1956) female.  Chief Complaint: No chief complaint on file.   HPI [name] presents to the office today for follow-up of MDD, GAD, panic disorder, insomnia and PTSD.  Describes mood today as "not the best". Pleasant. Denies tearfulness. Mood symptoms - reports increased anxiety - "mind going". Reports increased worry and rumination - over thinking. Reports minimal depression. Feels irritable at times. Denies recent panic attacks. Stating - "I'm not doing too good". Does not feel like the Prozac is effective for managing symptoms, but is willing to consider other options. Concerned about husband's health. Improved interest and motivation. Seeing therapist - Rockne Menghini. Taking medications as prescribed.  Energy levels stable. Active, has a regular exercise routine - bike riding.  Enjoys some usual interests and activities. Married. Lives with husband of 16 years. Mother lives in [place]. Spending time with family. Appetite adequate. Weight stable - 115 pounds. Sleeps better some nights than other. Averages 6 hours. Focus and concentration stable. Completing tasks. Managing aspects of household. 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  Denies SI or HI.  Denies AH or VH. Denies self harm. Denies substance use.  Previous medication trials: Remeron, Trazadone, Restoril, Xanax, Seroquel, Belsomra   GAD-7    Flowsheet Row Office Visit from 01/01/2022 in [facility] Visit from 04/27/2019 in [facility] Visit from 12/29/2018 in [facility]  Total GAD-7 Score 7 11 18       Mini-Mental    Flowsheet Row Office Visit from 12/22/2016 in [facility]  Total Score (max 30 points ) 30      PHQ2-9    Flowsheet Row Office Visit from 01/01/2022 in [facility] Visit from 05/27/2021 in [facility] Visit from 03/29/2021 in [facility] Office Visit from 05/23/2020 in [facility] Visit from 04/27/2019 in [facility] Total Score 0 0 0 0 0  PHQ-9 Total Score 1 -- 0 -- --        Review of Systems:  Review of Systems  Musculoskeletal:  Negative for gait problem.  Neurological:  Negative for tremors.  Psychiatric/Behavioral:         Please refer to HPI    Medications: I have reviewed the patient's current medications.  Current Outpatient Medications  Medication Sig Dispense Refill   sertraline (ZOLOFT) 50 MG tablet Take 1 tablet (50 mg total) by mouth daily. 90 tablet 1   ALPRAZolam (XANAX) 0.5 MG tablet Take 1 tablet (0.5 mg total) by mouth 3 (three) times daily as needed for anxiety. 90 tablet 2   amLODipine (NORVASC) 2.5 MG tablet Take 1 tablet (2.5 mg total) by mouth daily. 90 tablet 1   atenolol (TENORMIN) 25 MG tablet Take 1 tablet (25 mg total) by mouth daily. 90 tablet 3   B-D 3CC LUER-LOK SYR 25GX1" 25G X 1" 3 ML MISC USE WITH CYANOCOBALAMIN EVERY 14 DAYS     cholecalciferol (VITAMIN D) 1000 UNITS tablet Take 1,000 Units by mouth every evening.     clobetasol (TEMOVATE) 0.05 % external solution APPLY TOPICALLY ONCE DAILY AS NEEDED FOR DERMATITIS 50 mL 2   cyanocobalamin (,VITAMIN B-12,) 1000 MCG/ML  injection INJECT 1 ML INTO THE MUSCLE EVERY 14 DAYS 6 mL 3   fluticasone (FLONASE) 50 MCG/ACT nasal spray Place 2 sprays into the nose daily. 16 g 5   hydrocortisone (ANUSOL-HC) 2.5 % rectal cream Insert rectally 2 times daily for 10 days 30 g 1   lamoTRIgine (LAMICTAL) 200 MG tablet Take 1 tablet (200 mg total) by mouth at bedtime. 90 tablet 3   meclizine (ANTIVERT) 25 MG tablet Take 1 tablet (25 mg total) by mouth 3 (three) times daily as needed for dizziness. 30 tablet 0   ondansetron (ZOFRAN) 4 MG  tablet Take 1 tablet (4 mg total) by mouth every 8 (eight) hours as needed for nausea or vomiting. 20 tablet 0   SYRINGE-NEEDLE, DISP, 3 ML (B-D 3CC LUER-LOK SYR 25GX1") 25G X 1" 3 ML MISC Use as directed to inject b-12 into the skin 6 each 3   No current facility-administered medications for this visit.    Medication Side Effects: None  Allergies:  Allergies  Allergen Reactions   Aspartame And Phenylalanine Other (See Comments)    Migraines   Beef-Derived Products Other (See Comments)    severe cramping   Citrus Other (See Comments)    Causes Interstitial cystitis flares   Penicillins Hives and Rash    Denies airway involvement Has patient had a PCN reaction causing immediate rash, facial/tongue/throat swelling, SOB or lightheadedness with hypotension: yes hives.  Has patient had a PCN reaction causing severe rash involving mucus membranes or skin necrosis:no Has patient had a PCN reaction that required hospitalization No Has patient had a PCN reaction occurring within the last 10 years: No If all of the above answers are "NO", then may proceed with Cephalosporin use.    Promethazine Hcl Other (See Comments)    Muscle twitching and contracture    Beta Vulgaris Other (See Comments)    beets   Adhesive [Tape] Other (See Comments)    Red splotches   Doxycycline Nausea And Vomiting   Morphine Sulfate Nausea And Vomiting    Past Medical History:  Diagnosis Date   Allergy    Anemia    after birth of child 68yrs ago   Anxiety and depression    with insomnia   Blood transfusion without reported diagnosis    x3   Chronic back pain    ? RA, ? Lupus - reports saw rheumatologist in the past but better now, sees Dr. Dutch Quint   Eczema    Flushing 05/27/2021   GERD 02/13/2007   Qualifier: Diagnosis of  By: Jonny Ruiz MD, Len Blalock    GERD (gastroesophageal reflux disease)    takes Omeprazole daily, with nausea   Greater trochanteric bursitis of left hip 03/25/2019   Injected March 25, 2019   Heart murmur    Hemorrhoids    History of kidney stones    passed on her own   History of migraine    last one a yr ago and takes Zomig prn   Hot flashes 11/10/2014   HTN (hypertension) 03/31/2012   Hx of echocardiogram    Echo (8/15):  EF 55-60%, no RWMA   IBS (irritable bowel syndrome)    constipation predominant   Interstitial cystitis    hx of UTI   Irritable bowel syndrome 12/14/2009   Qualifier: Diagnosis of  By: Leone Payor MD, Charlyne Quale    Memory loss 12/22/2016   Numbness    both legs and related to back   Osteoporosis  osteopenia   Palpitations    on metoprolol in past but made BP too low   Pneumonia    hx of;last time 55yrs ago   PONV (postoperative nausea and vomiting)    laryngospasm-1999   Poor vision 10/15/2015   -? Ocular rosacea -sees opthomology    Raynaud's disease /phenomenon 10/21/2012   no meds, worse in the winter or cold environment.    Rheumatoid aortitis    uses Diclofenac gel;Rheumatoid   Seasonal allergies    Flonase daily    Strain of left piriformis muscle 05/18/2019   Urinary incontinence     Past Medical History, Surgical history, Social history, and Family history were reviewed and updated as appropriate.   Please see review of systems for further details on the patient's review from today.   Objective:   Physical Exam:  There were no vitals taken for this visit.  Physical Exam Constitutional:      General: She is not in acute distress. Musculoskeletal:        General: No deformity.  Neurological:     Mental Status: She is alert and oriented to person, place, and time.     Coordination: Coordination normal.  Psychiatric:        Attention and Perception: Attention and perception normal. She does not perceive auditory or visual hallucinations.        Mood and Affect: Mood normal. Mood is not anxious or depressed. Affect is not labile, blunt, angry or inappropriate.        Speech: Speech normal.        Behavior: Behavior  normal.        Thought Content: Thought content normal. Thought content is not paranoid or delusional. Thought content does not include homicidal or suicidal ideation. Thought content does not include homicidal or suicidal plan.        Cognition and Memory: Cognition and memory normal.        Judgment: Judgment normal.     Comments: Insight intact     Lab Review:     Component Value Date/Time   NA 141 05/27/2021 1112   NA 143 12/22/2016 0948   K 5.0 05/27/2021 1112   CL 102 05/27/2021 1112   CO2 30 05/27/2021 1112   GLUCOSE 80 05/27/2021 1112   GLUCOSE 95 04/21/2006 1635   BUN 25 (H) 05/27/2021 1112   BUN 17 12/22/2016 0948   CREATININE 1.04 05/27/2021 1112   CALCIUM 9.8 07/04/2021 1150   PROT 6.9 03/29/2021 1243   PROT 7.0 12/22/2016 0948   ALBUMIN 4.6 03/29/2021 1243   ALBUMIN 4.7 12/22/2016 0948   AST 20 03/29/2021 1243   ALT 13 03/29/2021 1243   ALKPHOS 81 03/29/2021 1243   BILITOT 0.5 03/29/2021 1243   BILITOT 0.3 12/22/2016 0948   GFRNONAA 65 12/22/2016 0948   GFRAA 75 12/22/2016 0948       Component Value Date/Time   WBC 4.7 03/29/2021 1243   RBC 4.63 03/29/2021 1243   HGB 14.0 03/29/2021 1243   HGB 14.1 12/22/2016 0948   HCT 43.2 03/29/2021 1243   HCT 44.0 12/22/2016 0948   PLT 186.0 03/29/2021 1243   PLT 206 12/22/2016 0948   MCV 93.3 03/29/2021 1243   MCV 94 12/22/2016 0948   MCH 30.3 12/22/2016 0948   MCH 30.7 11/01/2015 1154   MCHC 32.4 03/29/2021 1243   RDW 13.7 03/29/2021 1243   RDW 14.3 12/22/2016 0948   LYMPHSABS 1.2 03/29/2021 1243   LYMPHSABS 0.9 12/22/2016  0948   MONOABS 0.5 03/29/2021 1243   EOSABS 0.2 03/29/2021 1243   EOSABS 0.1 12/22/2016 0948   BASOSABS 0.0 03/29/2021 1243   BASOSABS 0.0 12/22/2016 0948    No results found for: "POCLITH", "LITHIUM"   No results found for: "PHENYTOIN", "PHENOBARB", "VALPROATE", "CBMZ"   .res Assessment: Plan:    Plan:  PDMP reviewed  1. Xanax 0.5mg  TID - using some at bedtime 2. Lamictal  200mg  daily 3. Decrease Prozac 20mg  daily 4. Add Zoloft 50mg  daily  RTC 4 weeks  Patient advised to contact office with any questions, adverse effects, or acute worsening in signs and symptoms.  Discussed potential benefits, risk, and side effects of benzodiazepines to include potential risk of tolerance and dependence, as well as possible drowsiness.  Advised patient not to drive if experiencing drowsiness and to take lowest possible effective dose to minimize risk of dependence and tolerance.  Diagnoses and all orders for this visit:  Moderate recurrent major depression (HCC)  Generalized anxiety disorder -     sertraline (ZOLOFT) 50 MG tablet; Take 1 tablet (50 mg total) by mouth daily.  Insomnia, unspecified type  Panic disorder  PTSD (post-traumatic stress disorder) -     lamoTRIgine (LAMICTAL) 200 MG tablet; Take 1 tablet (200 mg total) by mouth at bedtime.     Please see After Visit Summary for patient specific instructions.  Future Appointments  Date Time Provider Department Center  05/13/2022 11:00 AM Mathis Fare, LCSW [facility] None  05/29/2022 10:20 AM Claiborne Billings, Renee A, DO [facility]  07/17/2022 10:20 AM [facility] 3 [facility]  08/15/2022  4:00 PM Jernee Murtaugh, Thereasa Solo, NP [facility] None    No orders of the defined types were placed in this encounter.   -------------------------------

## 2022-04-28 IMAGING — DX DG ABDOMEN 2V
2 series · 2 of 2 positions shown · non-contrast
Comparison: None.

CLINICAL DATA: Constipation.  Gas and bloating for 5 days.

EXAM:
ABDOMEN - 2 VIEW

[abdomen erect]
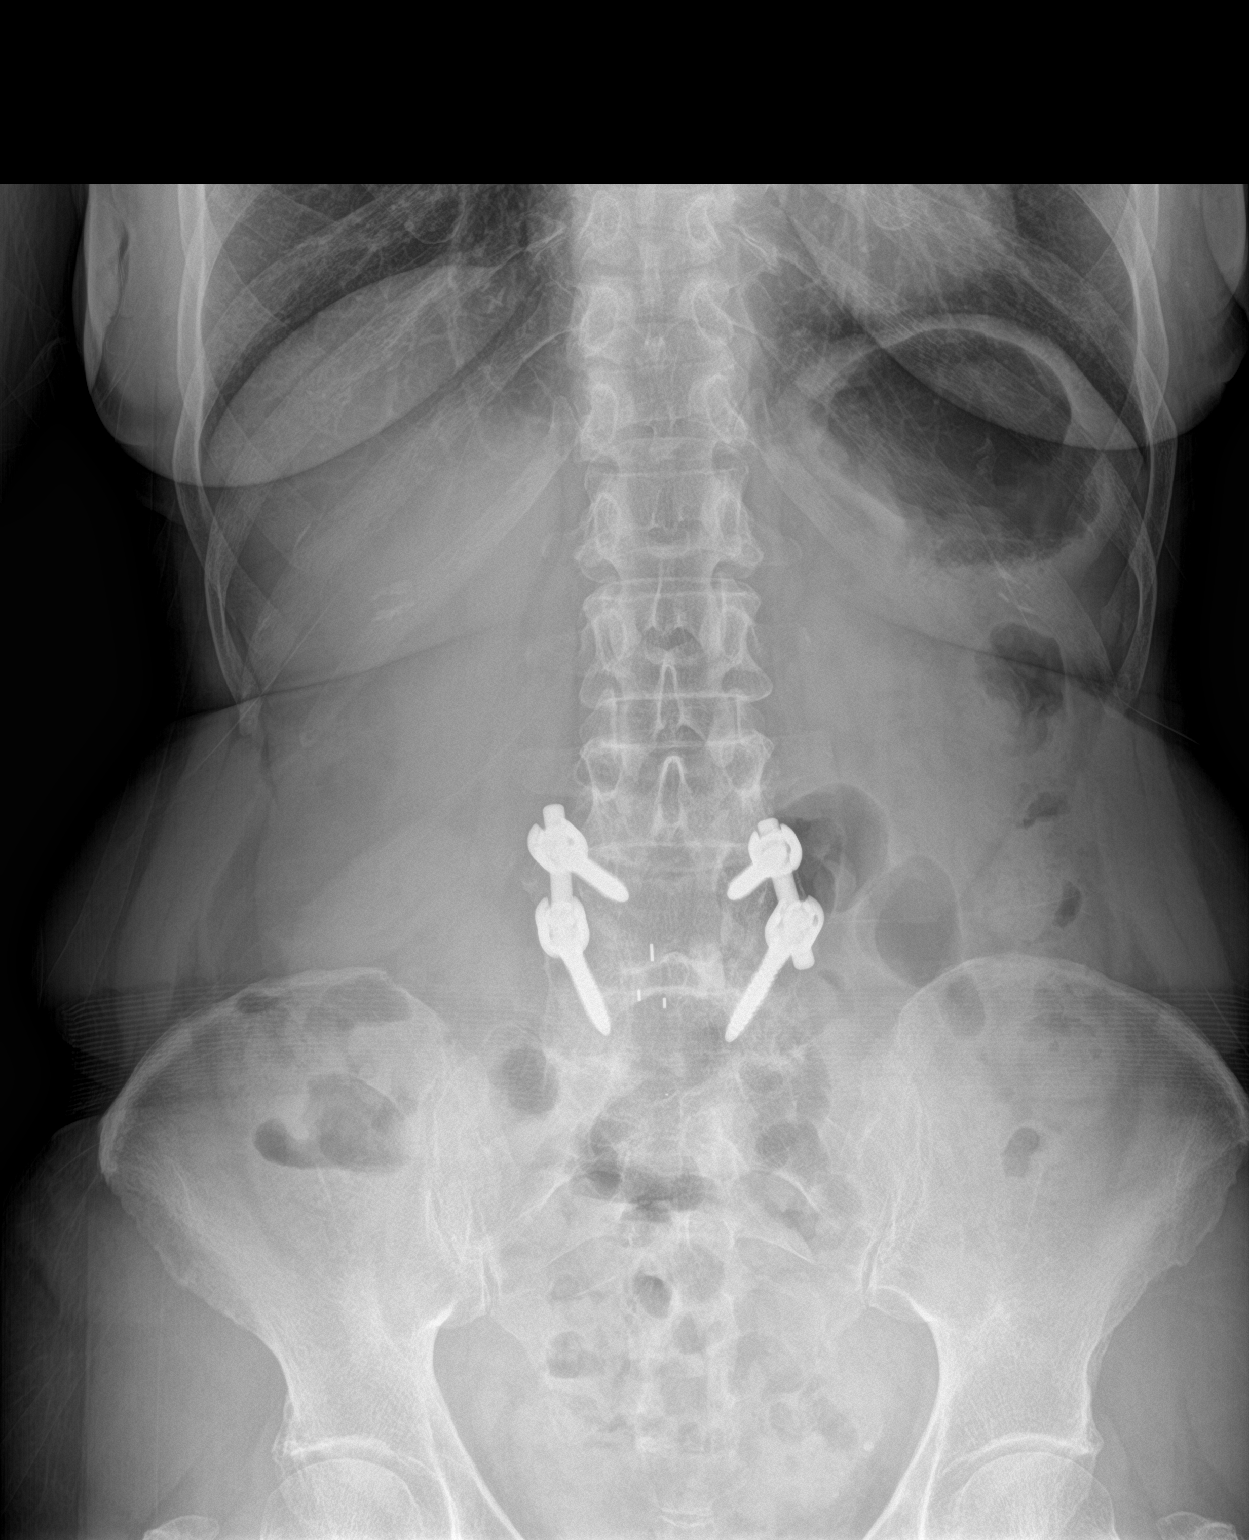

[abdomen supine]
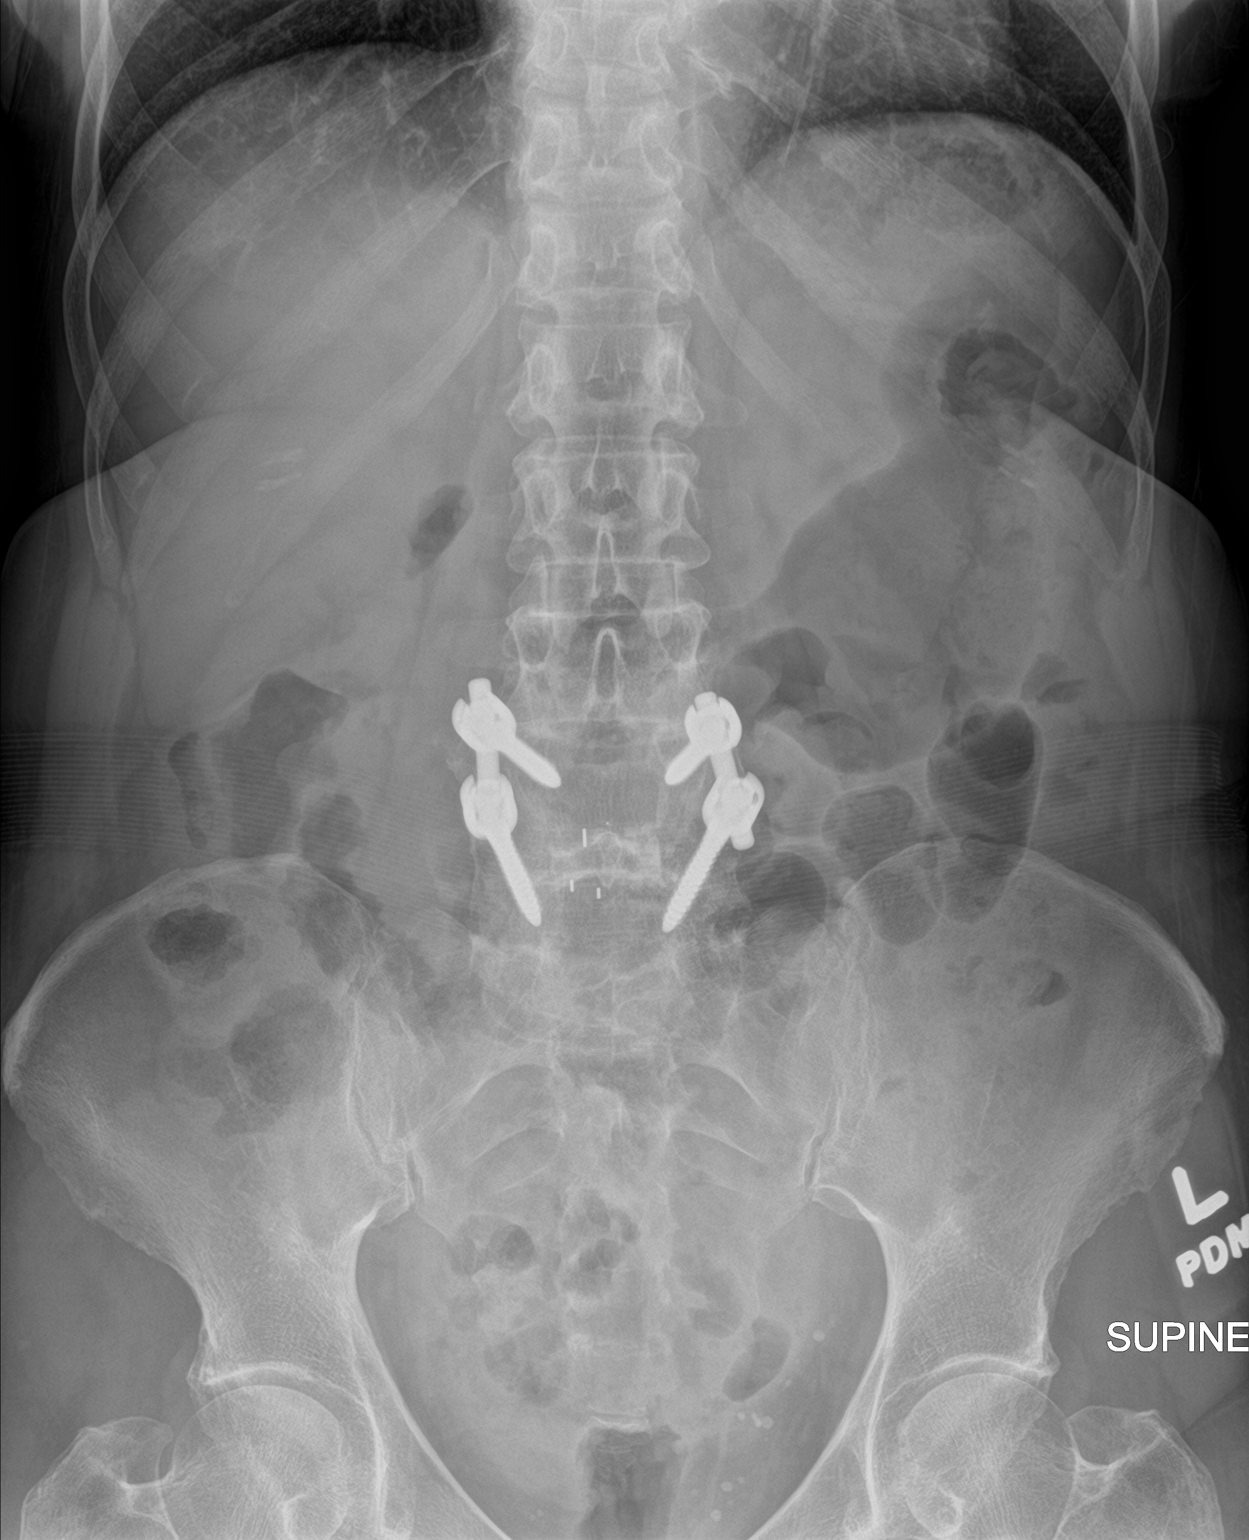

[2 of 2 positions shown; findings below may reference images not displayed]

FINDINGS: Supine and upright views of the abdomen obtained. No bowel
dilatation to suggest obstruction. No free intra-abdominal air. Only
small volume of formed stool in the colon. There is no abnormal
rectal distention. No radiopaque calculi or abnormal soft tissue
calcifications. Multiple pelvic calcifications are typically
phleboliths. L4-L5 fusion hardware. Lung bases are clear. No acute
osseous findings.
IMPRESSION: Normal bowel gas pattern. Small volume of colonic stool, not
suggestive of constipation.

## 2022-05-05 DIAGNOSIS — H93291 Other abnormal auditory perceptions, right ear: Secondary | ICD-10-CM | POA: Diagnosis not present

## 2022-05-06 DIAGNOSIS — H0102A Squamous blepharitis right eye, upper and lower eyelids: Secondary | ICD-10-CM | POA: Diagnosis not present

## 2022-05-06 DIAGNOSIS — H0102B Squamous blepharitis left eye, upper and lower eyelids: Secondary | ICD-10-CM | POA: Diagnosis not present

## 2022-05-06 DIAGNOSIS — H2513 Age-related nuclear cataract, bilateral: Secondary | ICD-10-CM | POA: Diagnosis not present

## 2022-05-08 ENCOUNTER — Encounter: Payer: Self-pay | Admitting: Family Medicine

## 2022-05-13 ENCOUNTER — Ambulatory Visit: Payer: 59 | Admitting: Psychiatry

## 2022-05-19 ENCOUNTER — Other Ambulatory Visit (HOSPITAL_COMMUNITY): Payer: Self-pay

## 2022-05-20 ENCOUNTER — Ambulatory Visit (INDEPENDENT_AMBULATORY_CARE_PROVIDER_SITE_OTHER): Payer: 59 | Admitting: Psychiatry

## 2022-05-20 DIAGNOSIS — F411 Generalized anxiety disorder: Secondary | ICD-10-CM

## 2022-05-20 NOTE — Progress Notes (Signed)
[facility] Counselor/Therapist Progress Note  Patient ID: [name], MRN: [id number],    Date: 05/20/2022  Time Spent: 55 minutes   Treatment Type: Individual Therapy  Reported Symptoms: anxiety, work stress heightened  Mental Status Exam:  Appearance:   Casual     Behavior:  Appropriate, Sharing, and Motivated  Motor:  Normal  Speech/Language:   Clear and Coherent  Affect:  anxiety  Mood:  anxious  Thought process:  goal directed  Thought content:    WNL  Sensory/Perceptual disturbances:    WNL  Orientation:  oriented to person, place, time/date, situation, day of week, month of year, year, and stated date of Nov. 7, 2023  Attention:  Good  Concentration:  Good  Memory:  WNL  Fund of knowledge:   Good  Insight:    Good  Judgment:   Good  Impulse Control:  Good   Risk Assessment: Danger to Self:  No Self-injurious Behavior: No Danger to Others: No Duty to Warn:no Physical Aggression / Violence:No  Access to Firearms a concern: No  Gang Involvement:No   Subjective: Patient today reporting anxiety and continues to have difficulty sleeping. Feeling tired but also feeling"Have tried everything to help sleep but nothing seems to help much. Is currently trying a combo of meds per med provider but feeling she is just not getting good enough sleep consistently.  Is following through on her exercise in biking x3 weekly. Husband and she are "doing some better in relationship."Feels she has tried everything she knows or has been suggested re: sleep and to see med provider with next couple of weeks." Plans to go right after Thanksgiving to visit mother who "may be having some memory issues.  Tries to counteract negative thoughts with more positive thought patterns as practiced in previous sessions.  Plans to keep up her exercise/bike riding routine, eating healthy, remaining on her medication as prescribed, going to bed at reasonable hours in efforts to get better sleep.  Marital issues are some better.  Recently got a new bike from husband which she enjoys.  Is thinking more long-term about some decisions especially in terms of quality of life and work schedule.  (Not all details included in this note due to patient privacy needs).  Interventions: Cognitive Behavioral Therapy and Ego-Supportive  Progressing: Patient is motivated. Struggling to focus some due to immediate, unexpected health concern with husband.  Short term goal: Learn and implement personal skills for managing stress, solving daily problems, and resolving conflicts effectively. Strategy: Teach patient calming skills, problem solving skills, and conflict resolution skills, to better manage daily stressors   Diagnosis:   ICD-10-CM   1. Generalized anxiety disorder  F41.1      Plan: Patient today actively participating and showing motivation as she worked on her anxiety and significant work stressors.  (Not all details included in this note due to patient privacy needs".  Feels that her anxiety and stress is also continue to impact her difficulty sleeping and is to see her med provider soon as this is an ongoing problem for patient and they have tried several meds.  Continues to exercise regularly and doing some better in relationship with her husband which is a significant positive for patient.  Trying to counteract negative thoughts better, eating healthy, building up on the improved marital communication and spending time together.  Is considering her future more and what is going to be in her best interest long-term regarding some personal decisions.  Has  made some progress on goals and continues to work with goal-directed behaviors.  Needs continued therapy as part of her process and gaining support and pursuing her goals.  Pay attention to "what I can change versus cannot change and trying to turn my brain off at times".  She feels like that is contributing to her lack of good sleep at times  although does state that she sleeps better on a night when she is not going into work the next day.  She and husband continue their improvement in behavioral interaction and communication issues within their marital relationship. Encouraged patient and practicing more positive behaviors as noted in sessions including: Taking breaks as needed throughout the day to experience more calmness within herself, stay in touch with people who are supportive, work on letting go of negativity and over worrying in order to see more positives and experience more hope to move forward, believing more in herself that she can make certain changes to influence her life more positively especially within her family and at work, positive self talk and self-care, continue spending time outdoors and activities she enjoys including biking, intentionally looking for more positives each day, be aware of the positives within herself, maintain healthy boundaries with others, and realize the strengths she shows working with goal-directed behaviors to move in a direction that supports her improved emotional health.  Goal review and progress/challenges noted with patient.  Next appointment within 2 to 3 weeks.  This record has been created using [employer].  Chart creation errors have been sought, but may not always have been located and corrected.  Such creation errors do not reflect on the standard of medical care provided.   Shanon Ace, LCSW

## 2022-05-29 ENCOUNTER — Encounter: Payer: Self-pay | Admitting: Family Medicine

## 2022-05-29 ENCOUNTER — Other Ambulatory Visit (HOSPITAL_COMMUNITY): Payer: Self-pay

## 2022-05-29 ENCOUNTER — Ambulatory Visit (INDEPENDENT_AMBULATORY_CARE_PROVIDER_SITE_OTHER): Payer: 59 | Admitting: Family Medicine

## 2022-05-29 VITALS — BP 124/79 | HR 70 | Temp 98.7°F | Ht 62.21 in | Wt 114.0 lb

## 2022-05-29 DIAGNOSIS — I7300 Raynaud's syndrome without gangrene: Secondary | ICD-10-CM | POA: Diagnosis not present

## 2022-05-29 DIAGNOSIS — R002 Palpitations: Secondary | ICD-10-CM

## 2022-05-29 DIAGNOSIS — E2839 Other primary ovarian failure: Secondary | ICD-10-CM | POA: Diagnosis not present

## 2022-05-29 DIAGNOSIS — N6321 Unspecified lump in the left breast, upper outer quadrant: Secondary | ICD-10-CM | POA: Insufficient documentation

## 2022-05-29 DIAGNOSIS — Z0000 Encounter for general adult medical examination without abnormal findings: Secondary | ICD-10-CM | POA: Diagnosis not present

## 2022-05-29 DIAGNOSIS — Z23 Encounter for immunization: Secondary | ICD-10-CM | POA: Diagnosis not present

## 2022-05-29 DIAGNOSIS — E538 Deficiency of other specified B group vitamins: Secondary | ICD-10-CM | POA: Diagnosis not present

## 2022-05-29 DIAGNOSIS — Z79899 Other long term (current) drug therapy: Secondary | ICD-10-CM

## 2022-05-29 LAB — CBC WITH DIFFERENTIAL/PLATELET
Basophils Absolute: 0 10*3/uL (ref 0.0–0.1)
Basophils Relative: 1 % (ref 0.0–3.0)
Eosinophils Absolute: 0.2 10*3/uL (ref 0.0–0.7)
Eosinophils Relative: 4.6 % (ref 0.0–5.0)
HCT: 44.2 % (ref 36.0–46.0)
Hemoglobin: 14.1 g/dL (ref 12.0–15.0)
Lymphocytes Relative: 31.4 % (ref 12.0–46.0)
Lymphs Abs: 1.3 10*3/uL (ref 0.7–4.0)
MCHC: 32 g/dL (ref 30.0–36.0)
MCV: 94 fl (ref 78.0–100.0)
Monocytes Absolute: 0.4 10*3/uL (ref 0.1–1.0)
Monocytes Relative: 8.9 % (ref 3.0–12.0)
Neutro Abs: 2.2 10*3/uL (ref 1.4–7.7)
Neutrophils Relative %: 54.1 % (ref 43.0–77.0)
Platelets: 196 10*3/uL (ref 150.0–400.0)
RBC: 4.7 Mil/uL (ref 3.87–5.11)
RDW: 13.8 % (ref 11.5–15.5)
WBC: 4.1 10*3/uL (ref 4.0–10.5)

## 2022-05-29 LAB — COMPREHENSIVE METABOLIC PANEL
ALT: 16 U/L (ref 0–35)
AST: 23 U/L (ref 0–37)
Albumin: 4.5 g/dL (ref 3.5–5.2)
Alkaline Phosphatase: 84 U/L (ref 39–117)
BUN: 19 mg/dL (ref 6–23)
CO2: 31 mEq/L (ref 19–32)
Calcium: 9.5 mg/dL (ref 8.4–10.5)
Chloride: 105 mEq/L (ref 96–112)
Creatinine, Ser: 1.01 mg/dL (ref 0.40–1.20)
GFR: 58.3 mL/min — ABNORMAL LOW (ref >60.00)
Glucose, Bld: 85 mg/dL (ref 70–99)
Potassium: 4.8 mEq/L (ref 3.5–5.1)
Sodium: 141 mEq/L (ref 135–145)
Total Bilirubin: 0.5 mg/dL (ref 0.2–1.2)
Total Protein: 6.5 g/dL (ref 6.0–8.3)

## 2022-05-29 LAB — HEMOGLOBIN A1C: Hgb A1c MFr Bld: 6.1 % (ref 4.6–6.5)

## 2022-05-29 LAB — TSH: TSH: 1.8 u[IU]/mL (ref 0.35–5.50)

## 2022-05-29 LAB — VITAMIN B12: Vitamin B-12: 702 pg/mL (ref 211–911)

## 2022-05-29 MED ORDER — "BD LUER-LOK SYRINGE 25G X 1"" 3 ML MISC"
3 refills | Status: DC
  Filled 2022-05-29: qty 6, fill #0
  Filled 2022-11-19: qty 6, 90d supply, fill #0
  Filled 2023-02-13: qty 6, 90d supply, fill #1

## 2022-05-29 MED ORDER — ATENOLOL 25 MG PO TABS
25.0000 mg | ORAL_TABLET | Freq: Every day | ORAL | 3 refills | Status: DC
  Filled 2022-05-29 – 2022-08-01 (×2): qty 90, 90d supply, fill #0
  Filled 2022-12-18: qty 90, 90d supply, fill #1
  Filled 2023-04-07: qty 90, 90d supply, fill #2

## 2022-05-29 MED ORDER — CYANOCOBALAMIN 1000 MCG/ML IJ SOLN
1000.0000 ug | INTRAMUSCULAR | 3 refills | Status: DC
  Filled 2022-05-29: qty 6, fill #0
  Filled 2022-10-23: qty 6, 84d supply, fill #0
  Filled 2023-02-13: qty 6, 84d supply, fill #1

## 2022-05-29 MED ORDER — AMLODIPINE BESYLATE 2.5 MG PO TABS
2.5000 mg | ORAL_TABLET | Freq: Every day | ORAL | 1 refills | Status: DC
  Filled 2022-05-29: qty 90, 90d supply, fill #0

## 2022-05-29 NOTE — Patient Instructions (Addendum)
Return in about 1 year (around 05/31/2023) for cpe (20 min), Routine chronic condition follow-up.        Great to see you today.  I have refilled the medication(s) we provide.   If labs were collected, we will inform you of lab results once received either by echart message or telephone call.   - echart message- for normal results that have been seen by the patient already.   - telephone call: abnormal results or if patient has not viewed results in their echart. Health Maintenance After Age 4 After age 68, you are at a higher risk for certain long-term diseases and infections as well as injuries from falls. Falls are a major cause of broken bones and head injuries in people who are older than age 35. Getting regular preventive care can help to keep you healthy and well. Preventive care includes getting regular testing and making lifestyle changes as recommended by your health care provider. Talk with your health care provider about: Which screenings and tests you should have. A screening is a test that checks for a disease when you have no symptoms. A diet and exercise plan that is right for you. What should I know about screenings and tests to prevent falls? Screening and testing are the best ways to find a health problem early. Early diagnosis and treatment give you the best chance of managing medical conditions that are common after age 68. Certain conditions and lifestyle choices may make you more likely to have a fall. Your health care provider may recommend: Regular vision checks. Poor vision and conditions such as cataracts can make you more likely to have a fall. If you wear glasses, make sure to get your prescription updated if your vision changes. Medicine review. Work with your health care provider to regularly review all of the medicines you are taking, including over-the-counter medicines. Ask your health care provider about any side effects that may make you more likely to have a  fall. Tell your health care provider if any medicines that you take make you feel dizzy or sleepy. Strength and balance checks. Your health care provider may recommend certain tests to check your strength and balance while standing, walking, or changing positions. Foot health exam. Foot pain and numbness, as well as not wearing proper footwear, can make you more likely to have a fall. Screenings, including: Osteoporosis screening. Osteoporosis is a condition that causes the bones to get weaker and break more easily. Blood pressure screening. Blood pressure changes and medicines to control blood pressure can make you feel dizzy. Depression screening. You may be more likely to have a fall if you have a fear of falling, feel depressed, or feel unable to do activities that you used to do. Alcohol use screening. Using too much alcohol can affect your balance and may make you more likely to have a fall. Follow these instructions at home: Lifestyle Do not drink alcohol if: Your health care provider tells you not to drink. If you drink alcohol: Limit how much you have to: 0-1 drink a day for women. 0-2 drinks a day for men. Know how much alcohol is in your drink. In the [place] one drink equals one 12 oz bottle of beer (355 mL), one 5 oz glass of wine (148 mL), or one 1 oz glass of hard liquor (44 mL). Do not use any products that contain nicotine or tobacco. These products include cigarettes, chewing tobacco, and vaping devices, such as e-cigarettes. If you need  help quitting, ask your health care provider. Activity  Follow a regular exercise program to stay fit. This will help you maintain your balance. Ask your health care provider what types of exercise are appropriate for you. If you need a cane or walker, use it as recommended by your health care provider. Wear supportive shoes that have nonskid soles. Safety  Remove any tripping hazards, such as rugs, cords, and clutter. Install safety  equipment such as grab bars in bathrooms and safety rails on stairs. Keep rooms and walkways well-lit. General instructions Talk with your health care provider about your risks for falling. Tell your health care provider if: You fall. Be sure to tell your health care provider about all falls, even ones that seem minor. You feel dizzy, tiredness (fatigue), or off-balance. Take over-the-counter and prescription medicines only as told by your health care provider. These include supplements. Eat a healthy diet and maintain a healthy weight. A healthy diet includes low-fat dairy products, low-fat (lean) meats, and fiber from whole grains, beans, and lots of fruits and vegetables. Stay current with your vaccines. Schedule regular health, dental, and eye exams. Summary Having a healthy lifestyle and getting preventive care can help to protect your health and wellness after age 49. Screening and testing are the best way to find a health problem early and help you avoid having a fall. Early diagnosis and treatment give you the best chance for managing medical conditions that are more common for people who are older than age 81. Falls are a major cause of broken bones and head injuries in people who are older than age 37. Take precautions to prevent a fall at home. Work with your health care provider to learn what changes you can make to improve your health and wellness and to prevent falls. This information is not intended to replace advice given to you by your health care provider. Make sure you discuss any questions you have with your health care provider. Document Revised: 11/19/2020 Document Reviewed: 11/19/2020 Elsevier Patient Education  [place].

## 2022-05-29 NOTE — Progress Notes (Signed)
Patient ID: [name], female  DOB: Jun 24, 1956, 66 y.o.   MRN: [id number] Patient Care Team    Relationship Specialty Notifications Start End  Ma Hillock, DO PCP - General Family Medicine  12/29/18   Gavin Pound, MD Consulting Physician Rheumatology  04/09/17   Maisie Fus, MD (Inactive) Consulting Physician Obstetrics and Gynecology  04/09/17   Devra Dopp, MD Referring Physician Dermatology  04/09/17   [facility], [facility]  12/29/18    Comment: [facility]    12/29/18   [name], MD Consulting Physician Gastroenterology  12/29/18   Earnie Larsson, MD Consulting Physician Neurosurgery  12/29/18     Chief Complaint  Patient presents with   Annual Exam    [facility]; pt is fasting    Subjective: [name] is a 66 y.o.  Female  present for CPE/cmc. All past medical history, surgical history, allergies, family history, immunizations, medications and social history were updated in the electronic medical record today. All recent labs, ED visits and hospitalizations within the last year were reviewed.  Health maintenance:  Colonoscopy: completed 02/2013, 10 yr follow up [name]. Mammogram: scheduled 07/16/2021> [facility] scheduled 07/2022. Cervical cancer screening: has GYN- > 65 Immunizations: tdap UTD 2022, Influenza UTD 04/2022(encouraged yearly),shingrix completed, covid series completed, PNA20 today Infectious disease screening: Hep C completed DEXA: last completed 2020 at gyn Patient has a Dental home. Hospitalizations/ED visits: reviewed  Palpitations: Patient reports compliance with atenolol 25 mg daily Raynauds: Patient was started on amlodipine last visit.  She reports she only takes it on occasions when she feels like her blood pressure is elevated or she has Raynaud's. B12 injections are being performed at home.     05/29/2022   10:44 AM 01/01/2022    2:16 PM 05/27/2021   10:37 AM  03/29/2021   11:34 AM 05/23/2020    9:40 AM  Depression screen PHQ 2/9  Decreased Interest 0 0 0 0 0  Down, Depressed, Hopeless 0 0 0 0 0  PHQ - 2 Score 0 0 0 0 0  Altered sleeping 3 1  0   Tired, decreased energy 0 0  0   Change in appetite 0 0  0   Feeling bad or failure about yourself  0 0  0   Trouble concentrating 0 0  0   Moving slowly or fidgety/restless 0 0  0   Suicidal thoughts 0 0  0   PHQ-9 Score 3 1  0   Difficult doing work/chores    Not difficult at all       05/29/2022   10:45 AM 01/01/2022    2:17 PM 04/27/2019    9:39 AM 12/29/2018    1:56 PM  GAD 7 : Generalized Anxiety Score  Nervous, Anxious, on Edge '2 2 1 3  '$ Control/stop worrying '3 1 2 3  '$ Worry too much - different things '3 1 2 3  '$ Trouble relaxing '1 1 1 2  '$ Restless '1 1 1 2  '$ Easily annoyed or irritable '1 1 2 3  '$ Afraid - awful might happen 0 0 2 2  Total GAD 7 Score '11 7 11 18  '$ Anxiety Difficulty   Somewhat difficult Extremely difficult     Immunization History  Administered Date(s) Administered   Fluad Quad(high Dose 65+) 04/23/2022   Influenza,inj,Quad PF,6+ Mos 04/12/2014   Influenza-Unspecified 05/15/2015, 03/28/2018, 04/20/2020, 04/13/2021   PFIZER(Purple Top)SARS-COV-2 Vaccination 08/04/2019, 08/23/2019  PNEUMOCOCCAL CONJUGATE-20 05/29/2022   Td 07/15/2003   Tdap 01/11/2021   Zoster Recombinat (Shingrix) 04/27/2019, 09/26/2019   Past Medical History:  Diagnosis Date   Allergy    Anemia    after birth of child 81yr ago   Anxiety and depression    with insomnia   Blood transfusion without reported diagnosis    x3   Chronic back pain    ? RA, ? Lupus - reports saw rheumatologist in the past but better now, sees Dr. PTrenton Gammon  Eczema    Flushing 05/27/2021   GERD 02/13/2007   Qualifier: Diagnosis of  By: JJenny ReichmannMD, JHunt Oris   GERD (gastroesophageal reflux disease)    takes Omeprazole daily, with nausea   Greater trochanteric bursitis of left hip 03/25/2019   Injected March 25, 2019    Heart murmur    Hemorrhoids    History of kidney stones    passed on her own   History of migraine    last one a yr ago and takes Zomig prn   Hot flashes 11/10/2014   HTN (hypertension) 03/31/2012   Hx of echocardiogram    Echo (8/15):  EF 55-60%, no RWMA   IBS (irritable bowel syndrome)    constipation predominant   Interstitial cystitis    hx of UTI   Irritable bowel syndrome 12/14/2009   Qualifier: Diagnosis of  By: GCarlean PurlMD, FDimas Millin   Memory loss 12/22/2016   Numbness    both legs and related to back   Osteoporosis    osteopenia   Palpitations    on metoprolol in past but made BP too low   Pneumonia    hx of;last time 116yrago   PONV (postoperative nausea and vomiting)    laryngospasm-1999   Poor vision 10/15/2015   -? Ocular rosacea -sees opthomology    Raynaud's disease /phenomenon 10/21/2012   no meds, worse in the winter or cold environment.    Rheumatoid aortitis    uses Diclofenac gel;Rheumatoid   Seasonal allergies    Flonase daily    Strain of left piriformis muscle 05/18/2019   Urinary incontinence    Allergies  Allergen Reactions   Aspartame And Phenylalanine Other (See Comments)    Migraines   Beef-Derived Products Other (See Comments)    severe cramping   Citrus Other (See Comments)    Causes Interstitial cystitis flares   Penicillins Hives and Rash    Denies airway involvement Has patient had a PCN reaction causing immediate rash, facial/tongue/throat swelling, SOB or lightheadedness with hypotension: yes hives.  Has patient had a PCN reaction causing severe rash involving mucus membranes or skin necrosis:no Has patient had a PCN reaction that required hospitalization No Has patient had a PCN reaction occurring within the last 10 years: No If all of the above answers are "NO", then may proceed with Cephalosporin use.    Promethazine Hcl Other (See Comments)    Muscle twitching and contracture    Beta Vulgaris Other (See Comments)    beets    Adhesive [Tape] Other (See Comments)    Red splotches   Doxycycline Nausea And Vomiting   Morphine Sulfate Nausea And Vomiting   Past Surgical History:  Procedure Laterality Date   ABDOMINAL HYSTERECTOMY  1997   BREAST BIOPSY  1999   benign   COLONOSCOPY  02/11/2013 and 12/24/04   internal and external hemorrhoids   ENDOMETRIAL ABLATION     EXCISION MORTON'S NEUROMA Right  FLEXIBLE SIGMOIDOSCOPY  03/07/2008   internal and external hemorrhoids   POSTERIOR LUMBAR FUSION  05/02/2013   L4-L5 fusion (cage/screws/bone graft) Dr. Annette Stable   TONSILLECTOMY     UPPER GASTROINTESTINAL ENDOSCOPY  10/23/2010   gastritis, irregular Z-line   wisdom teeth extracted     Family History  Problem Relation Age of Onset   Coronary artery disease Father    Heart attack Father    Hypertension Father    Depression Father    Hyperlipidemia Father    Alcohol abuse Father    Early death Father    Kidney disease Father    Mental illness Father    Diverticulosis Mother    Hypertension Mother    Osteoarthritis Mother    Depression Mother    Hyperlipidemia Mother    Kidney disease Mother    Alcohol abuse Sister    Depression Sister    Diabetes Paternal Uncle    Hypertension Maternal Grandmother    Stroke Maternal Grandmother    Hypertension Maternal Grandfather    Pancreatic cancer Maternal Grandfather    Early death Maternal Grandfather    Stroke Paternal Grandfather    Hyperlipidemia Paternal Grandfather    Alcohol abuse Paternal Grandfather    Early death Paternal Grandfather    Hypertension Paternal Grandfather    Depression Paternal Grandmother    Dementia Paternal Grandmother    Arthritis Paternal Grandmother    Mental illness Paternal Grandmother    Alcohol abuse Daughter    Depression Daughter    Drug abuse Daughter    Alcohol abuse Son    Depression Son    Colon cancer Neg Hx    Social History   Social History Narrative   Spiritual Beliefs: methodist   Marital  status/children/pets: In second marriage.  First husband committed suicide.  2 children.   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   Safety:      -Wears a bicycle helmet riding a bike: Yes     -smoke alarm in the home:No     - wears seatbelt: Yes     - Feels safe in their relationships: Yes             Allergies as of 05/29/2022       Reactions   Aspartame And Phenylalanine Other (See Comments)   Migraines   Beef-derived Products Other (See Comments)   severe cramping   Citrus Other (See Comments)   Causes Interstitial cystitis flares   Penicillins Hives, Rash   Denies airway involvement Has patient had a PCN reaction causing immediate rash, facial/tongue/throat swelling, SOB or lightheadedness with hypotension: yes hives.  Has patient had a PCN reaction causing severe rash involving mucus membranes or skin necrosis:no Has patient had a PCN reaction that required hospitalization No Has patient had a PCN reaction occurring within the last 10 years: No If all of the above answers are "NO", then may proceed with Cephalosporin use.   Promethazine Hcl Other (See Comments)   Muscle twitching and contracture    Beta Vulgaris Other (See Comments)   beets   Adhesive [tape] Other (See Comments)   Red splotches   Doxycycline Nausea And Vomiting   Morphine Sulfate Nausea And Vomiting        Medication List        Accurate as of May 29, 2022 11:15 AM. If you have any questions, ask your nurse or doctor.          ALPRAZolam 0.5 MG tablet Commonly known  as: Xanax Take 1 tablet (0.5 mg total) by mouth 3 (three) times daily as needed for anxiety.   amLODipine 2.5 MG tablet Commonly known as: NORVASC Take 1 tablet (2.5 mg total) by mouth daily.   atenolol 25 MG tablet Commonly known as: TENORMIN Take 1 tablet (25 mg total) by mouth daily.   B-D 3CC LUER-LOK SYR 25GX1" 25G X 1" 3 ML Misc Generic drug: SYRINGE-NEEDLE (DISP) 3 ML USE WITH CYANOCOBALAMIN  EVERY 14 DAYS   B-D 3CC LUER-LOK SYR 25GX1" 25G X 1" 3 ML Misc Generic drug: SYRINGE-NEEDLE (DISP) 3 ML Use as directed to inject b-12 into the skin   cholecalciferol 1000 units tablet Commonly known as: VITAMIN D Take 1,000 Units by mouth every evening.   cyanocobalamin 1000 MCG/ML injection Commonly known as: VITAMIN B12 INJECT 1 ML INTO THE MUSCLE EVERY 14 DAYS   fluticasone 50 MCG/ACT nasal spray Commonly known as: FLONASE Place 2 sprays into the nose daily.   lamoTRIgine 200 MG tablet Commonly known as: LAMICTAL Take 1 tablet (200 mg total) by mouth at bedtime.   meclizine 25 MG tablet Commonly known as: ANTIVERT Take 1 tablet (25 mg total) by mouth 3 (three) times daily as needed for dizziness.   ondansetron 4 MG tablet Commonly known as: ZOFRAN Take 1 tablet (4 mg total) by mouth every 8 (eight) hours as needed for nausea or vomiting.   Proctozone-HC 2.5 % rectal cream Generic drug: hydrocortisone Insert rectally 2 times daily for 10 days   sertraline 50 MG tablet Commonly known as: Zoloft Take 1 tablet (50 mg total) by mouth daily.        All past medical history, surgical history, allergies, family history, immunizations andmedications were updated in the EMR today and reviewed under the history and medication portions of their EMR.      ROS: 14 pt review of systems performed and negative (unless mentioned in an HPI)  Objective: BP 124/79   Pulse 70   Temp 98.7 F (37.1 C)   Ht 5' 2.21" (1.58 m)   Wt 114 lb (51.7 kg)   SpO2 98%   BMI 20.71 kg/m  Physical Exam Vitals and nursing note reviewed.  Constitutional:      General: She is not in acute distress.    Appearance: Normal appearance. She is not ill-appearing or toxic-appearing.  HENT:     Head: Normocephalic and atraumatic.     Right Ear: Tympanic membrane, ear canal and external ear normal. There is no impacted cerumen.     Left Ear: Tympanic membrane, ear canal and external ear normal.  There is no impacted cerumen.     Nose: No congestion or rhinorrhea.     Mouth/Throat:     Mouth: Mucous membranes are moist.     Pharynx: Oropharynx is clear. No oropharyngeal exudate or posterior oropharyngeal erythema.  Eyes:     General: No scleral icterus.       Right eye: No discharge.        Left eye: No discharge.     Extraocular Movements: Extraocular movements intact.     Conjunctiva/sclera: Conjunctivae normal.     Pupils: Pupils are equal, round, and reactive to light.  Cardiovascular:     Rate and Rhythm: Normal rate and regular rhythm.     Pulses: Normal pulses.     Heart sounds: Normal heart sounds. No murmur heard.    No friction rub. No gallop.  Pulmonary:     Effort: Pulmonary effort  is normal. No respiratory distress.     Breath sounds: Normal breath sounds. No stridor. No wheezing, rhonchi or rales.  Chest:     Chest wall: No tenderness.  Abdominal:     General: Abdomen is flat. Bowel sounds are normal. There is no distension.     Palpations: Abdomen is soft. There is no mass.     Tenderness: There is no abdominal tenderness. There is no right CVA tenderness, left CVA tenderness, guarding or rebound.     Hernia: No hernia is present.  Musculoskeletal:        General: No swelling, tenderness or deformity. Normal range of motion.     Cervical back: Normal range of motion and neck supple. No rigidity or tenderness.     Right lower leg: No edema.     Left lower leg: No edema.  Lymphadenopathy:     Cervical: No cervical adenopathy.  Skin:    General: Skin is warm and dry.     Coloration: Skin is not jaundiced or pale.     Findings: No bruising, erythema, lesion or rash.  Neurological:     General: No focal deficit present.     Mental Status: She is alert and oriented to person, place, and time. Mental status is at baseline.     Cranial Nerves: No cranial nerve deficit.     Sensory: No sensory deficit.     Motor: No weakness.     Coordination: Coordination  normal.     Gait: Gait normal.     Deep Tendon Reflexes: Reflexes normal.  Psychiatric:        Mood and Affect: Mood normal.        Behavior: Behavior normal.        Thought Content: Thought content normal.        Judgment: Judgment normal.     No results found.  Assessment/plan: CHASTY RANDAL is a 66 y.o. female present for CPE/CMC B12 deficiency Compliant with home B12 injections - Vitamin B12 collected today Heart palpitations Continue atenolol 25 mg daily as needed - CBC with Differential/Platelet - Comprehensive metabolic panel - TSH Raynaud's-elevated BP: Stable Continue amlodipine 2.5 mg daily as needed Raynaud's disease without gangrene - CBC with Differential/Platelet Encounter for long-term current use of medication - Comprehensive metabolic panel - Hemoglobin A1c - TSH - Lipid panel Estrogen deficiency - DG Bone Density; Future Need for pneumococcal vaccination - Pneumococcal conjugate vaccine 20-valent Mass of upper outer quadrant of left breast - MM Digital Diagnostic Unilat L; Future - MM Digital Diagnostic Unilat R; Future - US BREAST LTD UNI LEFT INC AXILLA; Future Routine general medical examination at a health care facility Colonoscopy: completed 02/2013, 10 yr follow up [name]. Mammogram: scheduled 07/16/2021> [facility] scheduled 07/2022. Cervical cancer screening: has GYN- > 65 Immunizations: tdap UTD 2022, Influenza UTD 04/2022(encouraged yearly),shingrix completed, covid series completed, PNA20 today Infectious disease screening: Hep C completed DEXA: last completed 2020 at gyn Patient was encouraged to exercise greater than 150 minutes a week. Patient was encouraged to choose a diet filled with fresh fruits and vegetables, and lean meats. AVS provided to patient today for education/recommendation on gender specific health and safety maintenance.  Return in about 1 year (around 05/31/2023) for cpe (20 min), Routine chronic condition  follow-up.  Orders Placed This Encounter  Procedures   DG Bone Density   MM Digital Diagnostic Unilat L   MM Digital Diagnostic Unilat R   US BREAST LTD UNI LEFT INC  AXILLA   Pneumococcal conjugate vaccine 20-valent   CBC with Differential/Platelet   Comprehensive metabolic panel   Hemoglobin A1c   TSH   Lipid panel   B12    Meds ordered this encounter  Medications   amLODipine (NORVASC) 2.5 MG tablet    Sig: Take 1 tablet (2.5 mg total) by mouth daily.    Dispense:  90 tablet    Refill:  1   atenolol (TENORMIN) 25 MG tablet    Sig: Take 1 tablet (25 mg total) by mouth daily.    Dispense:  90 tablet    Refill:  3   cyanocobalamin (VITAMIN B12) 1000 MCG/ML injection    Sig: INJECT 1 ML INTO THE MUSCLE EVERY 14 DAYS    Dispense:  6 mL    Refill:  3   SYRINGE-NEEDLE, DISP, 3 ML (B-D 3CC LUER-LOK SYR 25GX1") 25G X 1" 3 ML MISC    Sig: Use as directed to inject b-12 into the skin    Dispense:  6 each    Refill:  3    Referral Orders  No referral(s) requested today     Electronically signed by: Howard Pouch, [place]

## 2022-05-30 LAB — LIPID PANEL
Cholesterol: 208 mg/dL — ABNORMAL HIGH (ref 0–200)
HDL: 71.1 mg/dL (ref >39.00)
LDL Cholesterol: 122 mg/dL — ABNORMAL HIGH (ref 0–99)
NonHDL: 137.38
Total CHOL/HDL Ratio: 3
Triglycerides: 78 mg/dL (ref 0.0–149.0)
VLDL: 15.6 mg/dL (ref 0.0–40.0)

## 2022-06-17 ENCOUNTER — Encounter: Payer: Self-pay | Admitting: Adult Health

## 2022-06-17 ENCOUNTER — Ambulatory Visit (INDEPENDENT_AMBULATORY_CARE_PROVIDER_SITE_OTHER): Payer: 59 | Admitting: Psychiatry

## 2022-06-17 ENCOUNTER — Other Ambulatory Visit (HOSPITAL_COMMUNITY): Payer: Self-pay

## 2022-06-17 ENCOUNTER — Ambulatory Visit: Payer: 59 | Admitting: Adult Health

## 2022-06-17 DIAGNOSIS — F411 Generalized anxiety disorder: Secondary | ICD-10-CM

## 2022-06-17 DIAGNOSIS — G4700 Insomnia, unspecified: Secondary | ICD-10-CM

## 2022-06-17 DIAGNOSIS — F410 Panic disorder [episodic paroxysmal anxiety] without agoraphobia: Secondary | ICD-10-CM | POA: Diagnosis not present

## 2022-06-17 DIAGNOSIS — F331 Major depressive disorder, recurrent, moderate: Secondary | ICD-10-CM

## 2022-06-17 DIAGNOSIS — F4310 Post-traumatic stress disorder, unspecified: Secondary | ICD-10-CM

## 2022-06-17 MED ORDER — SERTRALINE HCL 100 MG PO TABS
100.0000 mg | ORAL_TABLET | Freq: Every day | ORAL | 1 refills | Status: DC
  Filled 2022-06-17: qty 90, 90d supply, fill #0

## 2022-06-17 NOTE — Progress Notes (Signed)
[facility] Counselor/Therapist Progress Note  Patient ID: [name], MRN: [id number],    Date: 06/17/2022  Time Spent: 50 minutes   Treatment Type: Individual Therapy  Reported Symptoms: depression, anxiety, no SI, anger, tearfulness  Mental Status Exam:  Appearance:   Casual     Behavior:  Appropriate, Sharing, and Motivated  Motor:  Normal  Speech/Language:   Clear and Coherent  Affect:  Depressed and anxious , tearful  Mood:  angry, anxious, and depressed  Thought process:  goal directed  Thought content:    Rumination  Sensory/Perceptual disturbances:    WNL  Orientation:  oriented to person, place, time/date, situation, day of week, month of year, year, and stated date of Dec. 5, 2023  Attention:  Good  Concentration:  Good  Memory:  WNL  Fund of knowledge:   Good  Insight:    Good  Judgment:   Good  Impulse Control:  Good and Fair   Risk Assessment: Danger to Self:  No Self-injurious Behavior: No Danger to Others: No Duty to Warn:no Physical Aggression / Violence:No  Access to Firearms a concern: No  Gang Involvement:No   Subjective: Patient today seen more emergently and tearfully processing very stressful recent trip to see mother in [place].  Denies any SI.  Had tough conversations where patient felt disappointed, overwhelmed, and angry. Patient felt minimized and dismissed by some of mother's hurtful comments.  Also having significant communication issues with husband that is adding to her struggle right now.  Was able to use this session today to vent a lot of concerns and then be able to talk through some of them, particularly looking at how she might be able to handle certain things a little differently that might help, looking at communication strategies that might get help build a bridge between she and husband as they had been much better and communicating a few weeks ago but has gone downhill in the midst of a lot of stress.  Also named and  processed some good self-care skills for patient including more positive self talk and encouraging herself versus lighting her weaknesses or faults.  Working on better self-calming strategie and trying to be not as quick to assume "the worst".  Asked to be seen more frequently after the session and we have worked that out on the schedule so that she can have the support she needs during this more trying time, and she acknowledges that the holiday season is an additional layer of stress for her and family.  Interventions: Cognitive Behavioral Therapy, Solution-Oriented/Positive Psychology, and Ego-Supportive  Progressing: Patient is motivated. Struggling to focus some due to immediate, unexpected health concern with husband.  Short term goal: Learn and implement personal skills for managing stress, solving daily problems, and resolving conflicts effectively. 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  Patient very tearful initially and was able to more openly share what all has happened since her last appointment with therapist at which point she was doing some better.  Several layers of stress involved and patient was able to share her emotions and how her struggle is impacting her relationship with husband and her  mother, and also fearful that it could impact her job.  As noted above, she did very well and being open and directing her communication and was able to express her emotions more fully which also helped her think in a more hopeful pattern.  Looked at some things that are out of her control but also looked at what is in her control that might could help her  currently juggle all the feeling she has in all the concerns which she is trying to manage effectively.  Looked at some changes she might be able to make that would be helpful and also some things that she cannot change.  Vented a lot of thoughts, fears, and tears, but also closer to the end of session some things that could be more positive and things that she could change in some ways that might create some optimism for her and better managing going forward.  Trying to hang onto more positive thoughts and counteract her negative thoughts.  Feels that it may help her in the ID we discussed today of taking smaller bits of time rather than looking at a long period of time ahead of her.  Feels that she does need to make some decisions regarding how long she works until retirement and yet is having more physical challenges that is factoring into that decision process.  Looking at "what I can change versus what I cannot change", stating that she does need to be able to turn her brain off at times due to overthinking.  Encouraged her to focus also on getting better sleep when possible and remain on her prescribed medications. Encouraged patient in practicing more of the positive behaviors as noted in session including: Taking breaks as she needs throughout the day to experience more calmness, remain in touch with people who are supportive, work on letting go of negativity and over worrying in order to see more positives and experience more hope, believe more in herself that she can make certain changes to influence her life more positively especially within her family and at work, positive self talk and self-care, continue spending time outdoors in activities that she enjoys including biking, intentionally look for more positives each day, maintain healthy boundaries with others, and recognize the strength she shows working with goal-directed behaviors to move in a direction that supports her improved emotional health  and overall wellbeing.  Goal review and progress/challenges noted with patient.  Next appointment within 2 weeks.  This record has been created using [employer].  Chart creation errors have been sought, but may not always have been located and corrected.  Such creation errors do not reflect on the standard of medical care provided.   Shanon Ace, LCSW

## 2022-06-17 NOTE — Progress Notes (Signed)
[name] [id number] 1956/04/16 66 y.o.  Subjective:   Patient ID:  [name] is a 66 y.o. (DOB 1956/05/15) female.  Chief Complaint: No chief complaint on file.   HPI [name] presents to the office today for follow-up of MDD, GAD, panic disorder, insomnia and PTSD.  Describes mood today as "not the best". Pleasant. Denies tearfulness. Mood symptoms - reports increased anxiety - "it's pretty bad". Reports depression - "pretty disgusted". Reports irritability - "has a low tolerance - lots of anger". Reports some worry - associated with retirement. Denies recent panic attacks. Mood is lower. Reports situational stressors. Stating - "I'm not doing too good". Has transitioned off the Prozac onto Zoloft at 50mg  daily - willing to increase dose to 100mg . Improved interest and motivation. Seeing therapist - Rockne Menghini. Taking medications as prescribed.  Energy levels stable. Active, has a regular exercise routine - bike riding.  Enjoys some usual interests and activities. Married. Lives with husband of 16 years and 2 dogs. Mother lives in [place]. Spending time with family. Appetite adequate. Weight stable - 115 pounds. Sleeps better some nights than other - hip bothering her the past few days - using heating pad. Averages 5 to 6 hours. Focus and concentration stable. Completing tasks. Managing aspects of household. 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  Denies SI or HI.  Denies AH or VH. Denies self harm. Denies substance use.  Previous medication trials: Remeron, Trazadone, Restoril, Xanax, Seroquel, Belsomra   GAD-7    Flowsheet Row Office Visit from 05/29/2022 in [facility] Visit from 01/01/2022 in [facility] Visit from 04/27/2019 in [facility] Visit from 12/29/2018 in [place] [facility]  Total GAD-7 Score 11 7 11 18       Mini-Mental    Flowsheet  Row Office Visit from 12/22/2016 in [facility]  Total Score (max 30 points ) 30      PHQ2-9    Flowsheet Row Office Visit from 05/29/2022 in [facility] Visit from 01/01/2022 in [facility] Office Visit from 05/27/2021 in [facility] Visit from 03/29/2021 in [facility] Office Visit from 05/23/2020 in [facility] Total Score 0 0 0 0 0  PHQ-9 Total Score 3 1 -- 0 --        Review of Systems:  Review of Systems  Musculoskeletal:  Negative for gait problem.  Neurological:  Negative for tremors.  Psychiatric/Behavioral:         Please refer to HPI    Medications: I have reviewed the patient's current medications.  Current Outpatient Medications  Medication Sig Dispense Refill   ALPRAZolam (XANAX) 0.5 MG tablet Take 1 tablet (0.5 mg total) by mouth 3 (three) times daily as needed for anxiety. 90 tablet 2   amLODipine (NORVASC) 2.5 MG tablet Take 1 tablet (2.5 mg total) by mouth daily. 90 tablet 1   atenolol (TENORMIN) 25 MG tablet Take 1 tablet (25 mg total) by mouth daily. 90 tablet 3   B-D 3CC LUER-LOK SYR 25GX1" 25G X 1" 3 ML MISC USE WITH CYANOCOBALAMIN EVERY 14 DAYS     cholecalciferol (VITAMIN D) 1000 UNITS tablet Take 1,000 Units by mouth every evening.     cyanocobalamin (VITAMIN B12) 1000  MCG/ML injection Inject 1 mL (1,000 mcg total) into the muscle every 14 (fourteen) days. 6 mL 3   fluticasone (FLONASE) 50 MCG/ACT nasal spray Place 2 sprays into the nose daily. 16 g 5   hydrocortisone (ANUSOL-HC) 2.5 % rectal cream Insert rectally 2 times daily for 10 days 30 g 1   lamoTRIgine (LAMICTAL) 200 MG tablet Take 1 tablet (200 mg total) by mouth at bedtime. 90 tablet 3   meclizine (ANTIVERT) 25 MG tablet Take 1 tablet (25 mg total) by mouth 3 (three) times daily as needed for dizziness. 30 tablet 0   ondansetron  (ZOFRAN) 4 MG tablet Take 1 tablet (4 mg total) by mouth every 8 (eight) hours as needed for nausea or vomiting. 20 tablet 0   sertraline (ZOLOFT) 100 MG tablet Take 1 tablet (100 mg total) by mouth daily. 90 tablet 1   SYRINGE-NEEDLE, DISP, 3 ML (B-D 3CC LUER-LOK SYR 25GX1") 25G X 1" 3 ML MISC Use as directed to inject b-12 into the skin 6 each 3   No current facility-administered medications for this visit.    Medication Side Effects: None  Allergies:  Allergies  Allergen Reactions   Aspartame And Phenylalanine Other (See Comments)    Migraines   Beef-Derived Products Other (See Comments)    severe cramping   Citrus Other (See Comments)    Causes Interstitial cystitis flares   Penicillins Hives and Rash    Denies airway involvement Has patient had a PCN reaction causing immediate rash, facial/tongue/throat swelling, SOB or lightheadedness with hypotension: yes hives.  Has patient had a PCN reaction causing severe rash involving mucus membranes or skin necrosis:no Has patient had a PCN reaction that required hospitalization No Has patient had a PCN reaction occurring within the last 10 years: No If all of the above answers are "NO", then may proceed with Cephalosporin use.    Promethazine Hcl Other (See Comments)    Muscle twitching and contracture    Beta Vulgaris Other (See Comments)    beets   Adhesive [Tape] Other (See Comments)    Red splotches   Doxycycline Nausea And Vomiting   Morphine Sulfate Nausea And Vomiting    Past Medical History:  Diagnosis Date   Allergy    Anemia    after birth of child 27yrs ago   Anxiety and depression    with insomnia   Blood transfusion without reported diagnosis    x3   Chronic back pain    ? RA, ? Lupus - reports saw rheumatologist in the past but better now, sees Dr. Dutch Quint   Eczema    Flushing 05/27/2021   GERD 02/13/2007   Qualifier: Diagnosis of  By: Jonny Ruiz MD, Len Blalock    GERD (gastroesophageal reflux disease)    takes  Omeprazole daily, with nausea   Greater trochanteric bursitis of left hip 03/25/2019   Injected March 25, 2019   Heart murmur    Hemorrhoids    History of kidney stones    passed on her own   History of migraine    last one a yr ago and takes Zomig prn   Hot flashes 11/10/2014   HTN (hypertension) 03/31/2012   Hx of echocardiogram    Echo (8/15):  EF 55-60%, no RWMA   IBS (irritable bowel syndrome)    constipation predominant   Interstitial cystitis    hx of UTI   Irritable bowel syndrome 12/14/2009   Qualifier: Diagnosis of  By: Leone Payor MD, Alfonse Ras  E    Memory loss 12/22/2016   Numbness    both legs and related to back   Osteoporosis    osteopenia   Palpitations    on metoprolol in past but made BP too low   Pneumonia    hx of;last time 85yrs ago   PONV (postoperative nausea and vomiting)    laryngospasm-1999   Poor vision 10/15/2015   -? Ocular rosacea -sees opthomology    Raynaud's disease /phenomenon 10/21/2012   no meds, worse in the winter or cold environment.    Rheumatoid aortitis    uses Diclofenac gel;Rheumatoid   Seasonal allergies    Flonase daily    Strain of left piriformis muscle 05/18/2019   Urinary incontinence     Past Medical History, Surgical history, Social history, and Family history were reviewed and updated as appropriate.   Please see review of systems for further details on the patient's review from today.   Objective:   Physical Exam:  There were no vitals taken for this visit.  Physical Exam Constitutional:      General: She is not in acute distress. Musculoskeletal:        General: No deformity.  Neurological:     Mental Status: She is alert and oriented to person, place, and time.     Coordination: Coordination normal.  Psychiatric:        Attention and Perception: Attention and perception normal. She does not perceive auditory or visual hallucinations.        Mood and Affect: Mood normal. Mood is not anxious or depressed.  Affect is not labile, blunt, angry or inappropriate.        Speech: Speech normal.        Behavior: Behavior normal.        Thought Content: Thought content normal. Thought content is not paranoid or delusional. Thought content does not include homicidal or suicidal ideation. Thought content does not include homicidal or suicidal plan.        Cognition and Memory: Cognition and memory normal.        Judgment: Judgment normal.     Comments: Insight intact     Lab Review:     Component Value Date/Time   NA 141 05/29/2022 1032   NA 143 12/22/2016 0948   K 4.8 05/29/2022 1032   CL 105 05/29/2022 1032   CO2 31 05/29/2022 1032   GLUCOSE 85 05/29/2022 1032   GLUCOSE 95 04/21/2006 1635   BUN 19 05/29/2022 1032   BUN 17 12/22/2016 0948   CREATININE 1.01 05/29/2022 1032   CALCIUM 9.5 05/29/2022 1032   PROT 6.5 05/29/2022 1032   PROT 7.0 12/22/2016 0948   ALBUMIN 4.5 05/29/2022 1032   ALBUMIN 4.7 12/22/2016 0948   AST 23 05/29/2022 1032   ALT 16 05/29/2022 1032   ALKPHOS 84 05/29/2022 1032   BILITOT 0.5 05/29/2022 1032   BILITOT 0.3 12/22/2016 0948   GFRNONAA 65 12/22/2016 0948   GFRAA 75 12/22/2016 0948       Component Value Date/Time   WBC 4.1 05/29/2022 1032   RBC 4.70 05/29/2022 1032   HGB 14.1 05/29/2022 1032   HGB 14.1 12/22/2016 0948   HCT 44.2 05/29/2022 1032   HCT 44.0 12/22/2016 0948   PLT 196.0 05/29/2022 1032   PLT 206 12/22/2016 0948   MCV 94.0 05/29/2022 1032   MCV 94 12/22/2016 0948   MCH 30.3 12/22/2016 0948   MCH 30.7 11/01/2015 1154   MCHC 32.0 05/29/2022  1032   RDW 13.8 05/29/2022 1032   RDW 14.3 12/22/2016 0948   LYMPHSABS 1.3 05/29/2022 1032   LYMPHSABS 0.9 12/22/2016 0948   MONOABS 0.4 05/29/2022 1032   EOSABS 0.2 05/29/2022 1032   EOSABS 0.1 12/22/2016 0948   BASOSABS 0.0 05/29/2022 1032   BASOSABS 0.0 12/22/2016 0948    No results found for: "POCLITH", "LITHIUM"   No results found for: "PHENYTOIN", "PHENOBARB", "VALPROATE", "CBMZ"    .res Assessment: Plan:    Plan:  PDMP reviewed  1. Xanax 0.5mg  TID - using at bedtime some nights 2. Lamictal 200mg  daily 3. Increase Zoloft 50mg  to 100mg  daily  RTC 6 weeks  Patient advised to contact office with any questions, adverse effects, or acute worsening in signs and symptoms.  Discussed potential benefits, risk, and side effects of benzodiazepines to include potential risk of tolerance and dependence, as well as possible drowsiness.  Advised patient not to drive if experiencing drowsiness and to take lowest possible effective dose to minimize risk of dependence and tolerance.  Diagnoses and all orders for this visit:  Moderate recurrent major depression (HCC)  Generalized anxiety disorder -     sertraline (ZOLOFT) 100 MG tablet; Take 1 tablet (100 mg total) by mouth daily.  Panic disorder  PTSD (post-traumatic stress disorder)  Insomnia, unspecified type     Please see After Visit Summary for patient specific instructions.  Future Appointments  Date Time Provider Department Center  06/24/2022  3:30 PM [facility] DIAG [place] 2 [facility]  06/24/2022  3:40 PM [facility] [place] 2 [facility]  06/24/2022  5:00 PM Mathis Fare, LCSW CP-[facility] None  08/15/2022  4:00 PM Maxmilian Trostel, Thereasa Solo, NP CP-[facility] None  06/02/2023  9:00 AM Kuneff, Renee A, DO [facility]    No orders of the defined types were placed in this encounter.   -------------------------------

## 2022-06-24 ENCOUNTER — Ambulatory Visit
Admission: RE | Admit: 2022-06-24 | Discharge: 2022-06-24 | Disposition: A | Discharge status: Home / Self Care | Payer: 59 | Source: Ambulatory Visit | Attending: Family Medicine | Admitting: Family Medicine

## 2022-06-24 ENCOUNTER — Ambulatory Visit: Payer: 59 | Admitting: Psychiatry

## 2022-06-24 DIAGNOSIS — R922 Inconclusive mammogram: Secondary | ICD-10-CM | POA: Diagnosis not present

## 2022-06-24 DIAGNOSIS — N6321 Unspecified lump in the left breast, upper outer quadrant: Secondary | ICD-10-CM

## 2022-06-24 DIAGNOSIS — N6489 Other specified disorders of breast: Secondary | ICD-10-CM | POA: Diagnosis not present

## 2022-06-30 ENCOUNTER — Ambulatory Visit: Payer: 59 | Admitting: Psychiatry

## 2022-07-01 ENCOUNTER — Ambulatory Visit (INDEPENDENT_AMBULATORY_CARE_PROVIDER_SITE_OTHER): Payer: 59 | Admitting: Psychiatry

## 2022-07-01 DIAGNOSIS — F331 Major depressive disorder, recurrent, moderate: Secondary | ICD-10-CM | POA: Diagnosis not present

## 2022-07-01 NOTE — Progress Notes (Signed)
[facility] Counselor/Therapist Progress Note  Patient ID: [name], MRN: [id number],    Date: 07/01/2022  Time Spent: 55 minutes   Treatment Type: Individual Therapy  Reported Symptoms: anxious, some depression, physically/emotionally tired  Mental Status Exam:  Appearance:   Casual     Behavior:  Appropriate, Sharing, and Motivated  Motor:  Normal  Speech/Language:   Clear and Coherent  Affect:  Depressed and anxious  Mood:  anxious and depressed  Thought process:  goal directed  Thought content:    Some overthinking and overanalyzing  Sensory/Perceptual disturbances:    WNL  Orientation:  oriented to person, place, time/date, situation, day of week, month of year, year, and stated date of Dec. 19, 2023  Attention:  Good  Concentration:  Good and Fair  Memory:  WNL  Fund of knowledge:   Good  Insight:    Good  Judgment:   Good  Impulse Control:  Good   Risk Assessment: Danger to Self:  No Self-injurious Behavior: No Danger to Others: No Duty to Warn:no Physical Aggression / Violence:No  Access to Firearms a concern: No  Gang Involvement:No   Subjective:  Patient today reporting anxiety (bad) and difficulty sleeping because of my stressing over things, worrying, and my anxiety "gets me". Stressing and worrying is worse because of multiple issues including relationship with husband where there's significant communication issues, thoughts about retirement, and relationship with her mother. Anger, frustration, worry, some depression, and anxiousness. Used session to explore better coping skills including needing better sleep, exercise, being in contact with supporting people, working more in therapy and beyond in managing her anxiety and depression more consistently, setting limits with mother without guilt, and patient letting go more of worries understanding that her worrying does nothing positive to help the situation and actually makes it more difficult for  patient.  Worked with several examples today in session and patient to help her in decreasing her worry. To be in touch with her med provider about medication that was to be helping her sleep but is not working for her.   Interventions: Cognitive Behavioral Therapy, Solution-Oriented/Positive Psychology, and Ego-Supportive  Long term goal: Develop healthy cognitive patterns and beliefs about self and the world that lead to alleviation of depression and anxiety and help prevent relapse.  Progressing: Patient is motivated. Struggling to focus some due to immediate, unexpected health concern with husband.  Short term goal: Learn and implement personal skills for managing stress, solving daily problems, and resolving conflicts effectively. Strategy: Teach patient calming skills, problem solving skills, and conflict resolution skills, to better manage daily stressors  Diagnosis:   ICD-10-CM   1. Moderate recurrent major depression ([place])  F33.1      Plan: Patient very motivated today and worked most of session on the issues of her excessive worrying whether it is within the relationship with husband or her mother or just herself.  Noted a lot of good participation and motivation in session however admits it is much harder to make the behavioral changes needed and working with her goals but is committed and worked on some steps today that she can try taking on her own and wants to be able to be less of a worried person.  To practice some strategies discussed and will pick back up on this next session as we ran out of time today.  Showing noticeably more motivation.  No tearfulness today and depression is less.  Encouraged to focus on "what I  can change versus what I cannot change. Encouraged patient and practicing more positive behaviors as noted in session including: Focus on getting better sleep and remain on prescribed medications, taking breaks as she needs throughout the day to experience more  calmness, remain in touch with people who are supportive, work on letting go of negativity and over worrying in order to see more positives and experience more hope, believe more in herself that she can make certain changes to influence her life more positively especially within her family and at work, positive self talk and self-care, continue spending time outdoors and activities she enjoys including biking, intentionally look for more positives each day, maintain healthy boundaries with others, and realize the strength she shows working with goal-directed behaviors to move in a direction that supports her improved emotional health.  Goal review and progress/challenges noted with patient.  Next appointment within 2 to 3 weeks.  This record has been created using [employer].  Chart creation errors have been sought, but may not always have been located and corrected.  Such creation errors do not reflect on the standard of medical care provided.   Shanon Ace, LCSW

## 2022-07-02 ENCOUNTER — Other Ambulatory Visit (HOSPITAL_COMMUNITY): Payer: Self-pay

## 2022-07-02 ENCOUNTER — Telehealth: Payer: Self-pay | Admitting: Adult Health

## 2022-07-02 MED ORDER — VALACYCLOVIR HCL 500 MG PO TABS
500.0000 mg | ORAL_TABLET | Freq: Two times a day (BID) | ORAL | 0 refills | Status: DC
  Filled 2022-07-02 (×2): qty 14, 7d supply, fill #0

## 2022-07-02 NOTE — Telephone Encounter (Signed)
Patient c/o difficulty initiating sleep, can usually stay asleep. She said you have tried almost everything and she is asking to try an extra alprazolam.   I show she has been on clonazepam, diazepam, mirtazapine (had SE), Seroquel, trazodone, and Ambien at some time in the past, though the clonazepam and diazepam were years ago by other providers.   I reviewed sleep hygiene - no nighttime caffeine, she reads and then takes a shower before bed, normal sleep time.

## 2022-07-02 NOTE — Telephone Encounter (Signed)
Pt called stating she still can't go to sleep. Need to talk to [facility].  RTC [phone]. Apt 1/16

## 2022-07-02 NOTE — Telephone Encounter (Signed)
LVM to RC 

## 2022-07-02 NOTE — Telephone Encounter (Signed)
Patient notified. She will call when she needs new RF.

## 2022-07-02 NOTE — Telephone Encounter (Signed)
We can try the extra Lorazepam.

## 2022-07-16 ENCOUNTER — Other Ambulatory Visit (HOSPITAL_COMMUNITY): Payer: Self-pay

## 2022-07-16 ENCOUNTER — Other Ambulatory Visit: Payer: Self-pay

## 2022-07-16 ENCOUNTER — Other Ambulatory Visit: Payer: Self-pay | Admitting: Family Medicine

## 2022-07-16 ENCOUNTER — Telehealth (INDEPENDENT_AMBULATORY_CARE_PROVIDER_SITE_OTHER): Payer: Commercial Managed Care - PPO | Admitting: Family Medicine

## 2022-07-16 ENCOUNTER — Encounter: Payer: Self-pay | Admitting: Family Medicine

## 2022-07-16 VITALS — Temp 102.0°F

## 2022-07-16 DIAGNOSIS — R5081 Fever presenting with conditions classified elsewhere: Secondary | ICD-10-CM

## 2022-07-16 DIAGNOSIS — J069 Acute upper respiratory infection, unspecified: Secondary | ICD-10-CM

## 2022-07-16 DIAGNOSIS — R069 Unspecified abnormalities of breathing: Secondary | ICD-10-CM | POA: Diagnosis not present

## 2022-07-16 LAB — POC COVID19 BINAXNOW: SARS Coronavirus 2 Ag: POSITIVE — AB

## 2022-07-16 LAB — POCT INFLUENZA A/B
Influenza A, POC: NEGATIVE
Influenza B, POC: NEGATIVE

## 2022-07-16 MED ORDER — NIRMATRELVIR/RITONAVIR (PAXLOVID)TABLET
3.0000 | ORAL_TABLET | Freq: Two times a day (BID) | ORAL | 0 refills | Status: AC
  Filled 2022-07-16: qty 30, 5d supply, fill #0

## 2022-07-16 MED ORDER — HYDROCODONE BIT-HOMATROP MBR 5-1.5 MG/5ML PO SOLN
5.0000 mL | Freq: Two times a day (BID) | ORAL | 0 refills | Status: DC | PRN
  Filled 2022-07-16 – 2022-07-17 (×2): qty 120, 6d supply, fill #0

## 2022-07-16 NOTE — Progress Notes (Signed)
Virtual Visit via Video Note  I connected with [name]  on 07/16/22 at 10:40 AM EST by a video enabled telemedicine application and verified that I am speaking with the correct person using two identifiers.  Location patient: [place] Location provider:work or home office Persons participating in the virtual visit: patient, provider  I discussed the limitations and requested verbal permission for telemedicine visit. The patient expressed understanding and agreed to proceed.  HPI: 67 year old female being seen today for fever. Onset 2 days ago, fever, bad nasal congestion and sinus pressure, cough, feels body aches when temperature is up. Current temperature 102.  No shortness of breath or wheezing. No sore throat.  Appetite is way down, fluid intake is fair. No nausea, vomiting, or diarrhea.   Aching Advil.  ROS: See pertinent positives and negatives per HPI.  Past Medical History:  Diagnosis Date   Allergy    Anemia    after birth of child 7yr ago   Anxiety and depression    with insomnia   Blood transfusion without reported diagnosis    x3   Chronic back pain    ? RA, ? Lupus - reports saw rheumatologist in the past but better now, sees Dr. PTrenton Gammon  Eczema    Flushing 05/27/2021   GERD 02/13/2007   Qualifier: Diagnosis of  By: JJenny ReichmannMD, JHunt Oris   GERD (gastroesophageal reflux disease)    takes Omeprazole daily, with nausea   Greater trochanteric bursitis of left hip 03/25/2019   Injected March 25, 2019   Heart murmur    Hemorrhoids    History of kidney stones    passed on her own   History of migraine    last one a yr ago and takes Zomig prn   Hot flashes 11/10/2014   HTN (hypertension) 03/31/2012   Hx of echocardiogram    Echo (8/15):  EF 55-60%, no RWMA   IBS (irritable bowel syndrome)    constipation predominant   Interstitial cystitis    hx of UTI   Irritable bowel syndrome 12/14/2009   Qualifier: Diagnosis of  By: GCarlean PurlMD, FDimas Millin   Memory loss 12/22/2016    Numbness    both legs and related to back   Osteoporosis    osteopenia   Palpitations    on metoprolol in past but made BP too low   Pneumonia    hx of;last time 89yr[age]   PONV (postoperative nausea and vomiting)    laryngospasm-1999   Poor vision 10/15/2015   -? Ocular rosacea -sees opthomology    Raynaud's disease /phenomenon 10/21/2012   no meds, worse in the winter or cold environment.    Rheumatoid aortitis    uses Diclofenac gel;Rheumatoid   Seasonal allergies    Flonase daily    Strain of left piriformis muscle 05/18/2019   Urinary incontinence     Past Surgical History:  Procedure Laterality Date   ABDOMINAL HYSTERECTOMY  1997   BREAST BIOPSY  1999   benign   COLONOSCOPY  02/11/2013 and 12/24/04   internal and external hemorrhoids   ENDOMETRIAL ABLATION     EXCISION MORTON'S NEUROMA Right    FLEXIBLE SIGMOIDOSCOPY  03/07/2008   internal and external hemorrhoids   POSTERIOR LUMBAR FUSION  05/02/2013   L4-L5 fusion (cage/screws/bone graft) Dr. PoAnnette Stable TONSILLECTOMY     UPPER GASTROINTESTINAL ENDOSCOPY  10/23/2010   gastritis, irregular Z-line   wisdom teeth extracted       Current  Outpatient Medications:    ALPRAZolam (XANAX) 0.5 MG tablet, Take 1 tablet (0.5 mg total) by mouth 3 (three) times daily as needed for anxiety., Disp: 90 tablet, Rfl: 2   amLODipine (NORVASC) 2.5 MG tablet, Take 1 tablet (2.5 mg total) by mouth daily., Disp: 90 tablet, Rfl: 1   atenolol (TENORMIN) 25 MG tablet, Take 1 tablet (25 mg total) by mouth daily., Disp: 90 tablet, Rfl: 3   B-D 3CC LUER-LOK SYR 25GX1" 25G X 1" 3 ML MISC, USE WITH CYANOCOBALAMIN EVERY 14 DAYS, Disp: , Rfl:    cholecalciferol (VITAMIN D) 1000 UNITS tablet, Take 1,000 Units by mouth every evening., Disp: , Rfl:    cyanocobalamin (VITAMIN B12) 1000 MCG/ML injection, Inject 1 mL (1,000 mcg total) into the muscle every 14 (fourteen) days., Disp: 6 mL, Rfl: 3   fluticasone (FLONASE) 50 MCG/ACT nasal spray, Place 2 sprays  into the nose daily., Disp: 16 g, Rfl: 5   HYDROcodone bit-homatropine (HYCODAN) 5-1.5 MG/5ML syrup, 1-2 tsp po bid prn cough, Disp: 120 mL, Rfl: 0   hydrocortisone (ANUSOL-HC) 2.5 % rectal cream, Insert rectally 2 times daily for 10 days, Disp: 30 g, Rfl: 1   lamoTRIgine (LAMICTAL) 200 MG tablet, Take 1 tablet (200 mg total) by mouth at bedtime., Disp: 90 tablet, Rfl: 3   meclizine (ANTIVERT) 25 MG tablet, Take 1 tablet (25 mg total) by mouth 3 (three) times daily as needed for dizziness., Disp: 30 tablet, Rfl: 0   ondansetron (ZOFRAN) 4 MG tablet, Take 1 tablet (4 mg total) by mouth every 8 (eight) hours as needed for nausea or vomiting., Disp: 20 tablet, Rfl: 0   sertraline (ZOLOFT) 100 MG tablet, Take 1 tablet (100 mg total) by mouth daily., Disp: 90 tablet, Rfl: 1   SYRINGE-NEEDLE, DISP, 3 ML (B-D 3CC LUER-LOK SYR 25GX1") 25G X 1" 3 ML MISC, Use as directed to inject b-12 into the skin, Disp: 6 each, Rfl: 3   valACYclovir (VALTREX) 500 MG tablet, Take 1 tablet (500 mg total) by mouth 2 (two) times daily (12 hours apart) for 1 week, Disp: 14 tablet, Rfl: 0  EXAM:  VITALS per patient if applicable:     29/47/6546   10:12 AM 04/07/2022    4:14 PM 04/07/2022    4:12 PM  Vitals with BMI  Height 5' 2.205"    Weight 114 lbs    BMI 50.35    Systolic [phone]  Diastolic 79 79 77  Pulse 70  60   GENERAL: alert, oriented, appears well and in no acute distress  HEENT: atraumatic, conjunttiva clear, no obvious abnormalities on inspection of external nose and ears  NECK: normal movements of the head and neck  LUNGS: on inspection no signs of respiratory distress, breathing rate appears normal, no obvious gross SOB, gasping or wheezing  CV: no obvious cyanosis  MS: moves all visible extremities without noticeable abnormality  PSYCH/NEURO: pleasant and cooperative, no obvious depression or anxiety, speech and thought processing grossly intact  LABS:   Chemistry      Component Value  Date/Time   NA 141 05/29/2022 1032   NA 143 12/22/2016 0948   K 4.8 05/29/2022 1032   CL 105 05/29/2022 1032   CO2 31 05/29/2022 1032   BUN 19 05/29/2022 1032   BUN 17 12/22/2016 0948   CREATININE 1.01 05/29/2022 1032      Component Value Date/Time   CALCIUM 9.5 05/29/2022 1032   ALKPHOS 84 05/29/2022 1032   AST  23 05/29/2022 1032   ALT 16 05/29/2022 1032   BILITOT 0.5 05/29/2022 1032   BILITOT 0.3 12/22/2016 0948      ASSESSMENT AND PLAN:  Discussed the following assessment and plan:  Febrile respiratory illness. Suspect viral etiology. She will come in today for flu and COVID swab. If flu and COVID testing are negative then we will have her get a chest x-ray. In the meantime continue Advil and I sent in his prescription for Hycodan syrup, 1 to 2 teaspoon twice daily as needed, 120 mL.  (GFR 60)   I discussed the assessment and treatment plan with the patient. The patient was provided an opportunity to ask questions and all were answered. The patient agreed with the plan and demonstrated an understanding of the instructions.   F/u: 2-3 d if not improving  Signed:  Crissie Sickles, MD           07/16/2022

## 2022-07-16 NOTE — Addendum Note (Signed)
Addended by: Beatrix Fetters on: 07/16/2022 01:52 PM   Modules accepted: Orders

## 2022-07-17 ENCOUNTER — Other Ambulatory Visit (HOSPITAL_COMMUNITY): Payer: Self-pay

## 2022-07-17 ENCOUNTER — Ambulatory Visit: Payer: 59

## 2022-07-23 ENCOUNTER — Ambulatory Visit: Payer: Commercial Managed Care - PPO | Admitting: Psychiatry

## 2022-07-23 DIAGNOSIS — F331 Major depressive disorder, recurrent, moderate: Secondary | ICD-10-CM

## 2022-07-23 NOTE — Progress Notes (Signed)
[facility] Counselor/Therapist Progress Note  Patient ID: [name], MRN: [id number],    Date: 07/23/2022  Time Spent: 55 minutes   Treatment Type: Individual Therapy  Reported Symptoms: depression, anxiety, anger  Mental Status Exam:  Appearance:   Casual and Neat     Behavior:  Appropriate, Sharing, and Motivated  Motor:  Normal  Speech/Language:   Clear and Coherent  Affect:  Depressed, Flat, and anxious at times, angry  Mood:  angry, anxious, depressed, and irritable  Thought process:  goal directed  Thought content:    Some rumination  Sensory/Perceptual disturbances:    WNL  Orientation:  oriented to person, place, time/date, situation, day of week, month of year, year, and stated date of Jan. 10, 2024  Attention:  Good  Concentration:  Good  Memory:  WNL  Fund of knowledge:   Good  Insight:    Good  Judgment:   Good  Impulse Control:  Good   Risk Assessment: Danger to Self:  No Self-injurious Behavior: No Danger to Others: No Duty to Warn:no Physical Aggression / Violence:No  Access to Firearms a concern: No  Gang Involvement:No   Subjective:  Patient in today reporting symptoms of depression, anxiety, and Anger. States "the holidays were awful". Past couple of weeks has been very difficult within family especially mother-in-law and patient shared how frustrated and miserable she was during the time they were with her. Angry that mother--in-law got extremely intoxicated during Family holiday event, and frustrated that she ruined part of family holiday time. Processed multiple frustrations with husband's mother especially within larger family group. Looked at ways patient can have better boundaries and not be so negatively impacted by mother-in-law's words and behavior. Practiced with actual examples during session  which was helpful for patient. Wanting to follow through with husband re: counseling for both of them together, and plans to talk with husband  this week.  Interventions: Cognitive Behavioral Therapy, Solution-Oriented/Positive Psychology, and Ego-Supportive  Long term goal: Develop healthy cognitive patterns and beliefs about self and the world that lead to alleviation of depression and anxiety and help prevent relapse.  Progressing: Patient is motivated. Struggling to focus some due to immediate, unexpected health concern with husband.  Short term goal: Learn and implement personal skills for managing stress, solving daily problems, and resolving conflicts effectively. Strategy: Teach patient calming skills, problem solving skills, and conflict resolution skills, to better manage daily stressors  Diagnosis:   ICD-10-CM   1. Moderate recurrent major depression ([place])  F33.1      Plan: Patient today came in feeling very angry, depressed, anxious, and frustrated.  Denied any thoughts to harm self or others.  Was able to talk through all of these feelings and the events that have led up to her feeling this acutely angry and frustrated which mostly revolved around issues with her husband and his family especially his mother that per patient report can be rude, disruptive, and a frustration to be around.  Patient vented and then began to work some on how she can better manage her feelings when she is in family situations and is very frustrated.  Also looking at how she can have better boundaries including being able to walk away from something when it is rude or disrespectful.  Looked at ways of how this feeds patient's anger and her difficulty in letting go of any anger or hurt which we processed more today, and patient seemed to get a better grasp on  this.  Is going to ask again if husband would be willing to go to marital counseling with another therapist.  I will see this patient again within 1 to 2 weeks.  Encouraged patient to use some of her exercise and bike riding to deal with the feeling she has of anger and resentment as "it does  help me clear my head at times".  Also to look at what "I can change versus what I cannot change". Encouraged patient in her practice of more positive behaviors as discussed in session including: Focusing on getting better sleep and take her prescribed medications regularly, take breaks as she needs throughout the day to experience more calmness, stay in touch with people who are supportive, work on letting go of negativity and over worrying in order to see more positives and experience more hope, believing more in herself that she can make difficult changes to influence her life more positively especially within her family and work environment, positive self talk and self-care, continue spending time outdoors in activities that she enjoys including biking, maintain healthy boundaries with others, and recognize the strength she shows working with goal-directed behaviors to move in a direction that supports her improved emotional health and outlook.  Goal review and progress/challenges noted with patient.   Next appt within 3 weeks.  This record has been created using [employer].  Chart creation errors have been sought, but may not always have been located and corrected.  Such creation errors do not reflect on the standard of medical care provided.   Shanon Ace, LCSW

## 2022-07-25 ENCOUNTER — Encounter: Payer: Self-pay | Admitting: Family Medicine

## 2022-07-28 ENCOUNTER — Other Ambulatory Visit (HOSPITAL_COMMUNITY): Payer: Self-pay

## 2022-07-28 ENCOUNTER — Other Ambulatory Visit: Payer: Self-pay

## 2022-07-28 ENCOUNTER — Ambulatory Visit (INDEPENDENT_AMBULATORY_CARE_PROVIDER_SITE_OTHER): Payer: Commercial Managed Care - PPO | Admitting: Family Medicine

## 2022-07-28 ENCOUNTER — Encounter: Payer: Self-pay | Admitting: Family Medicine

## 2022-07-28 VITALS — BP 102/62 | HR 74 | Temp 98.3°F | Ht 62.25 in | Wt 113.0 lb

## 2022-07-28 DIAGNOSIS — U099 Post covid-19 condition, unspecified: Secondary | ICD-10-CM | POA: Diagnosis not present

## 2022-07-28 DIAGNOSIS — J4520 Mild intermittent asthma, uncomplicated: Secondary | ICD-10-CM | POA: Diagnosis not present

## 2022-07-28 MED ORDER — ALBUTEROL SULFATE HFA 108 (90 BASE) MCG/ACT IN AERS
INHALATION_SPRAY | RESPIRATORY_TRACT | 5 refills | Status: DC
  Filled 2022-07-28: qty 6.7, 30d supply, fill #0

## 2022-07-28 NOTE — Patient Instructions (Addendum)
Return in about 2 weeks (around 08/11/2022), or if symptoms worsen or fail to improve.        Great to see you today.  I have refilled the medication(s) we provide.   If labs were collected, we will inform you of lab results once received either by echart message or telephone call.   - echart message- for normal results that have been seen by the patient already.   - telephone call: abnormal results or if patient has not viewed results in their echart.

## 2022-07-28 NOTE — Progress Notes (Signed)
[name] , 1956-04-27, 67 y.o., female MRN: [id number] Patient Care Team    Relationship Specialty Notifications Start End  Ma Hillock, DO PCP - General Family Medicine  12/29/18   Gavin Pound, MD Consulting Physician Rheumatology  04/09/17   Maisie Fus, MD (Inactive) Consulting Physician Obstetrics and Gynecology  04/09/17   Devra Dopp, MD Referring Physician Dermatology  04/09/17   [facility], [facility]  12/29/18    Comment: [facility]    12/29/18   [name], MD Consulting Physician Gastroenterology  12/29/18   Earnie Larsson, MD Consulting Physician Neurosurgery  12/29/18     Chief Complaint  Patient presents with   Shortness of Breath    Pt c/o SOB, cough, wheezing ; post covid 2 weeks     Subjective: Pt presents for an OV with complaints of cough, dyspnea on exertion, wheezing s/p covid 2 weeks ago.  Exercise causes dyspnea.  Patient reports she normally runs/walks daily and she noticed since she has had COVID 2 weeks ago she had to walk up the hill and she was out of breath.  She denies any fevers, chills or continued cough. She does not have a medical history of lung disease in the past.     05/29/2022   10:44 AM 01/01/2022    2:16 PM 05/27/2021   10:37 AM 03/29/2021   11:34 AM 05/23/2020    9:40 AM  Depression screen PHQ 2/9  Decreased Interest 0 0 0 0 0  Down, Depressed, Hopeless 0 0 0 0 0  PHQ - 2 Score 0 0 0 0 0  Altered sleeping 3 1  0   Tired, decreased energy 0 0  0   Change in appetite 0 0  0   Feeling bad or failure about yourself  0 0  0   Trouble concentrating 0 0  0   Moving slowly or fidgety/restless 0 0  0   Suicidal thoughts 0 0  0   PHQ-9 Score 3 1  0   Difficult doing work/chores    Not difficult at all     Allergies  Allergen Reactions   Aspartame And Phenylalanine Other (See Comments)    Migraines   Beef-Derived Products Other (See Comments)     severe cramping   Citrus Other (See Comments)    Causes Interstitial cystitis flares   Penicillins Hives and Rash    Denies airway involvement Has patient had a PCN reaction causing immediate rash, facial/tongue/throat swelling, SOB or lightheadedness with hypotension: yes hives.  Has patient had a PCN reaction causing severe rash involving mucus membranes or skin necrosis:no Has patient had a PCN reaction that required hospitalization No Has patient had a PCN reaction occurring within the last 10 years: No If all of the above answers are "NO", then may proceed with Cephalosporin use.    Promethazine Hcl Other (See Comments)    Muscle twitching and contracture    Beta Vulgaris Other (See Comments)    beets   Adhesive [Tape] Other (See Comments)    Red splotches   Doxycycline Nausea And Vomiting   Morphine Sulfate Nausea And Vomiting   Social History   Social History Narrative   Spiritual Beliefs: methodist   Marital status/children/pets: In second marriage.  First husband committed suicide.  2 children.   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 RA, ? Lupus - reports saw rheumatologist in the past but better now, sees Dr. PTrenton Gammon  Eczema    Flushing 05/27/2021   GERD 02/13/2007   Qualifier: Diagnosis of  By: JJenny ReichmannMD, JHunt Oris   GERD (gastroesophageal reflux disease)    takes Omeprazole daily, with nausea   Greater trochanteric bursitis of left hip 03/25/2019   Injected March 25, 2019   Heart murmur    Hemorrhoids    History of kidney stones    passed on her own   History  of migraine    last one a yr ago and takes Zomig prn   Hot flashes 11/10/2014   HTN (hypertension) 03/31/2012   Hx of echocardiogram    Echo (8/15):  EF 55-60%, no RWMA   IBS (irritable bowel syndrome)    constipation predominant   Interstitial cystitis    hx of UTI   Irritable bowel syndrome 12/14/2009   Qualifier: Diagnosis of  By: GCarlean PurlMD, FDimas Millin   Memory loss 12/22/2016   Numbness    both legs and related to back   Osteoporosis    osteopenia   Palpitations    on metoprolol in past but made BP too low   Pneumonia    hx of;last time 14yrago   PONV (postoperative nausea and vomiting)    laryngospasm-1999   Poor vision 10/15/2015   -? Ocular rosacea -sees opthomology    Raynaud's disease /phenomenon 10/21/2012   no meds, worse in the winter or cold environment.    Rheumatoid aortitis    uses Diclofenac gel;Rheumatoid   Seasonal allergies    Flonase daily    Strain of left piriformis muscle 05/18/2019   Urinary incontinence    Past Surgical History:  Procedure Laterality Date   ABDOMINAL HYSTERECTOMY  1997   BREAST BIOPSY  1999   benign   COLONOSCOPY  02/11/2013 and 12/24/04   internal and external hemorrhoids   ENDOMETRIAL ABLATION     EXCISION MORTON'S NEUROMA Right    FLEXIBLE SIGMOIDOSCOPY  03/07/2008   internal and external hemorrhoids   POSTERIOR LUMBAR FUSION  05/02/2013   L4-L5 fusion (cage/screws/bone graft) Dr. PoAnnette Stable TONSILLECTOMY     UPPER GASTROINTESTINAL ENDOSCOPY  10/23/2010   gastritis, irregular Z-line   wisdom teeth extracted     Family History  Problem Relation Age of Onset   Coronary artery disease Father    Heart attack Father    Hypertension Father    Depression Father    Hyperlipidemia Father    Alcohol abuse Father    Early death Father    Kidney disease Father    Mental illness Father    Diverticulosis Mother    Hypertension Mother    Osteoarthritis Mother    Depression Mother    Hyperlipidemia Mother    Kidney disease Mother     Alcohol abuse Sister  Depression Sister    Diabetes Paternal Uncle    Hypertension Maternal Grandmother    Stroke Maternal Grandmother    Hypertension Maternal Grandfather    Pancreatic cancer Maternal Grandfather    Early death Maternal Grandfather    Stroke Paternal Grandfather    Hyperlipidemia Paternal Grandfather    Alcohol abuse Paternal Grandfather    Early death Paternal Grandfather    Hypertension Paternal Grandfather    Depression Paternal Grandmother    Dementia Paternal Grandmother    Arthritis Paternal Grandmother    Mental illness Paternal Grandmother    Alcohol abuse Daughter    Depression Daughter    Drug abuse Daughter    Alcohol abuse Son    Depression Son    Colon cancer Neg Hx    Allergies as of 07/28/2022       Reactions   Aspartame And Phenylalanine Other (See Comments)   Migraines   Beef-derived Products Other (See Comments)   severe cramping   Citrus Other (See Comments)   Causes Interstitial cystitis flares   Penicillins Hives, Rash   Denies airway involvement Has patient had a PCN reaction causing immediate rash, facial/tongue/throat swelling, SOB or lightheadedness with hypotension: yes hives.  Has patient had a PCN reaction causing severe rash involving mucus membranes or skin necrosis:no Has patient had a PCN reaction that required hospitalization No Has patient had a PCN reaction occurring within the last 10 years: No If all of the above answers are "NO", then may proceed with Cephalosporin use.   Promethazine Hcl Other (See Comments)   Muscle twitching and contracture    Beta Vulgaris Other (See Comments)   beets   Adhesive [tape] Other (See Comments)   Red splotches   Doxycycline Nausea And Vomiting   Morphine Sulfate Nausea And Vomiting        Medication List        Accurate as of July 28, 2022 10:43 AM. If you have any questions, ask your nurse or doctor.          STOP taking these medications    HYDROcodone  bit-homatropine 5-1.5 MG/5ML syrup Commonly known as: HYCODAN Stopped by: Howard Pouch, DO   sertraline 100 MG tablet Commonly known as: Zoloft Stopped by: Howard Pouch, DO       TAKE these medications    ALPRAZolam 0.5 MG tablet Commonly known as: Xanax Take 1 tablet (0.5 mg total) by mouth 3 (three) times daily as needed for anxiety.   amLODipine 2.5 MG tablet Commonly known as: NORVASC Take 1 tablet (2.5 mg total) by mouth daily.   atenolol 25 MG tablet Commonly known as: TENORMIN Take 1 tablet (25 mg total) by mouth daily.   B-D 3CC LUER-LOK SYR 25GX1" 25G X 1" 3 ML Misc Generic drug: SYRINGE-NEEDLE (DISP) 3 ML USE WITH CYANOCOBALAMIN EVERY 14 DAYS   B-D 3CC LUER-LOK SYR 25GX1" 25G X 1" 3 ML Misc Generic drug: SYRINGE-NEEDLE (DISP) 3 ML Use as directed to inject b-12 into the skin   cholecalciferol 1000 units tablet Commonly known as: VITAMIN D Take 1,000 Units by mouth every evening.   cyanocobalamin 1000 MCG/ML injection Commonly known as: VITAMIN B12 Inject 1 mL (1,000 mcg total) into the muscle every 14 (fourteen) days.   fluticasone 50 MCG/ACT nasal spray Commonly known as: FLONASE Place 2 sprays into the nose daily.   lamoTRIgine 200 MG tablet Commonly known as: LAMICTAL Take 1 tablet (200 mg total) by mouth at bedtime.   meclizine 25  MG tablet Commonly known as: ANTIVERT Take 1 tablet (25 mg total) by mouth 3 (three) times daily as needed for dizziness.   ondansetron 4 MG tablet Commonly known as: ZOFRAN Take 1 tablet (4 mg total) by mouth every 8 (eight) hours as needed for nausea or vomiting.   Proctozone-HC 2.5 % rectal cream Generic drug: hydrocortisone Insert rectally 2 times daily for 10 days   valACYclovir 500 MG tablet Commonly known as: VALTREX Take 1 tablet (500 mg total) by mouth 2 (two) times daily (12 hours apart) for 1 week        All past medical history, surgical history, allergies, family history, immunizations  andmedications were updated in the EMR today and reviewed under the history and medication portions of their EMR.     ROS Negative, with the exception of above mentioned in HPI   Objective:  BP 102/62   Pulse 74   Temp 98.3 F (36.8 C) (Oral)   Ht 5' 2.25" (1.581 m)   Wt 113 lb (51.3 kg)   SpO2 98%   BMI 20.50 kg/m  Body mass index is 20.5 kg/m. Physical Exam Vitals and nursing note reviewed.  Constitutional:      General: She is not in acute distress.    Appearance: Normal appearance. She is normal weight. She is not ill-appearing or toxic-appearing.  HENT:     Head: Normocephalic and atraumatic.  Eyes:     General: No scleral icterus.       Right eye: No discharge.        Left eye: No discharge.     Extraocular Movements: Extraocular movements intact.     Conjunctiva/sclera: Conjunctivae normal.     Pupils: Pupils are equal, round, and reactive to light.  Cardiovascular:     Rate and Rhythm: Normal rate and regular rhythm.  Pulmonary:     Effort: Pulmonary effort is normal. No respiratory distress.     Breath sounds: Normal breath sounds. No wheezing, rhonchi or rales.  Musculoskeletal:        General: No swelling.     Right lower leg: No edema.     Left lower leg: No edema.  Skin:    Findings: No rash.  Neurological:     Mental Status: She is alert and oriented to person, place, and time. Mental status is at baseline.     Motor: No weakness.     Coordination: Coordination normal.     Gait: Gait normal.  Psychiatric:        Mood and Affect: Mood normal.        Behavior: Behavior normal.        Thought Content: Thought content normal.        Judgment: Judgment normal.     No results found. No results found. No results found for this or any previous visit (from the past 24 hour(s)).  Assessment/Plan: TOMMIE DEJOSEPH is a 67 y.o. 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  She is only 2 weeks out from her COVID infection. Lung exam is normal today, do not suspect ongoing bronchitis or pneumonia at this time. Discussed albuterol use prior to exercise and as needed for wheezing and she is agreeable to this today. Follow-up as needed  Reviewed expectations re: course of current medical issues. Discussed self-management of symptoms. Outlined signs and symptoms indicating need for more acute intervention. Patient verbalized  understanding and all questions were answered. Patient received an After-Visit Summary.    No orders of the defined types were placed in this encounter.  No orders of the defined types were placed in this encounter.  Referral Orders  No referral(s) requested today     Note is dictated utilizing voice recognition software. Although note has been proof read prior to signing, occasional typographical errors still can be missed. If any questions arise, please do not hesitate to call for verification.   electronically signed by:  Howard Pouch, DO  [place]

## 2022-07-29 ENCOUNTER — Ambulatory Visit: Payer: Self-pay | Admitting: Adult Health

## 2022-08-01 ENCOUNTER — Encounter: Payer: Self-pay | Admitting: Family Medicine

## 2022-08-01 ENCOUNTER — Other Ambulatory Visit (HOSPITAL_COMMUNITY): Payer: Self-pay

## 2022-08-01 ENCOUNTER — Other Ambulatory Visit: Payer: Self-pay

## 2022-08-01 MED ORDER — PREDNISONE 20 MG PO TABS
ORAL_TABLET | ORAL | 0 refills | Status: AC
  Filled 2022-08-01: qty 15, 9d supply, fill #0

## 2022-08-01 NOTE — Telephone Encounter (Signed)
Please inform patient I called in a prednisone taper.

## 2022-08-02 ENCOUNTER — Other Ambulatory Visit (HOSPITAL_COMMUNITY): Payer: Self-pay

## 2022-08-05 ENCOUNTER — Other Ambulatory Visit: Payer: Self-pay

## 2022-08-05 ENCOUNTER — Other Ambulatory Visit (HOSPITAL_COMMUNITY): Payer: Self-pay

## 2022-08-07 ENCOUNTER — Ambulatory Visit (INDEPENDENT_AMBULATORY_CARE_PROVIDER_SITE_OTHER): Payer: Commercial Managed Care - PPO | Admitting: Psychiatry

## 2022-08-07 DIAGNOSIS — F331 Major depressive disorder, recurrent, moderate: Secondary | ICD-10-CM | POA: Diagnosis not present

## 2022-08-07 NOTE — Progress Notes (Deleted)
[facility] Counselor/Therapist Progress Note  Patient ID: [name], MRN: [id number],    Date: 08/07/2022  Time Spent: ***   Treatment Type: Individual Therapy  Reported Symptoms: ***  Mental Status Exam:  Appearance:   {PSY:22683}     Behavior:  {PSY:21022743}  Motor:  {PSY:22302}  Speech/Language:   {PSY:22685}  Affect:  {PSY:22687}  Mood:  {PSY:31886}  Thought process:  {PSY:31888}  Thought content:    {PSY:[phone]}  Sensory/Perceptual disturbances:    {[phone]}  Orientation:  {PSY:30297}  Attention:  {PSY:22877}  Concentration:  {PSY:347-807-8798}  Memory:  {[phone]}  Fund of knowledge:   {PSY:347-807-8798}  Insight:    {PSY:347-807-8798}  Judgment:   {PSY:347-807-8798}  Impulse Control:  {PSY:347-807-8798}   Risk Assessment: Danger to Self:  {PSY:22692} Self-injurious Behavior: {PSY:22692} Danger to Others: {PSY:22692} Duty to Warn:{PSY:311194} Physical Aggression / Violence:{PSY:21197} Access to Firearms a concern: {PSY:21197} Gang Involvement:{PSY:21197}  Subjective: ***      Patient in today reporting symptoms of depression, anxiety, and Anger. States "the holidays were awful". Past couple of weeks has been very difficult within family especially mother-in-law and patient shared how frustrated and miserable she was during the time they were with her. Angry that mother--in-law got extremely intoxicated during Family holiday event, and frustrated that she ruined part of family holiday time. Processed multiple frustrations with husband's mother especially within larger family group. Looked at ways patient can have better boundaries and not be so negatively impacted by mother-in-law's words and behavior. Practiced with actual examples during session  which was helpful for patient. Wanting to follow through with husband re: counseling for both of them together, and plans to talk with husband this week.    Interventions: {PSY:[phone]}  Long  term goal: Develop healthy cognitive patterns and beliefs about self and the world that lead to alleviation of depression and anxiety and help prevent relapse.  Progressing: Patient is motivated. Struggling to focus some due to immediate, unexpected health concern with husband.  Short term goal: Learn and implement personal skills for managing stress, solving daily problems, and resolving conflicts effectively. Strategy: Teach patient calming skills, problem solving skills, and conflict resolution skills, to better manage daily stressors  Diagnosis:No diagnosis found.  Plan: ***   Patient today came in feeling very angry, depressed, anxious, and frustrated.  Denied any thoughts to harm self or others.  Was able to talk through all of these feelings and the events that have led up to her feeling this acutely angry and frustrated which mostly revolved around issues with her husband and his family especially his mother that per patient report can be rude, disruptive, and a frustration to be around.  Patient vented and then began to work some on how she can better manage her feelings when she is in family situations and is very frustrated.  Also looking at how she can have better boundaries including being able to walk away from something when it is rude or disrespectful.  Looked at ways of how this feeds patient's anger and her difficulty in letting go of any anger or hurt which we processed more today, and patient seemed to get a better grasp on this.  Is going to ask again if husband would be willing to go to marital counseling with another therapist.  I will see this patient again within 1 to 2 weeks.     Encouraged patient to use some of her exercise and bike riding to deal with the feeling she has of  anger and resentment as "it does help me clear my head at times".  Also to look at what "I can change versus what I cannot change". Encouraged patient in her practice of more positive behaviors as  discussed in session including: Focusing on getting better sleep and take her prescribed medications regularly, take breaks as she needs throughout the day to experience more calmness, stay in touch with people who are supportive, work on letting go of negativity and over worrying in order to see more positives and experience more hope, believing more in herself that she can make difficult changes to influence her life more positively especially within her family and work environment, positive self talk and self-care, continue spending time outdoors in activities that she enjoys including biking, maintain healthy boundaries with others, and recognize the strength she shows working with goal-directed behaviors to move in a direction that supports her improved emotional health and outlook.   /////////////////////////////////////////////////////////////////////////////////////////////////////////////   Encouraged patient           Goal review and progress/challenges noted with patient.  Next appointment within 3 weeks.  This record has been created using [employer].  Chart creation errors have been sought, but may not always have been located and corrected.  Such creation errors do not reflect on the standard of medical care provided.   Shanon Ace, LCSW

## 2022-08-07 NOTE — Progress Notes (Signed)
[facility] Counselor/Therapist Progress Note  Patient ID: [name], MRN: [id number],    Date: 08/07/2022  Time Spent: 50 minutes   Treatment Type: Individual Therapy  Reported Symptoms: depression, anger, anxiety, frustration  Mental Status Exam:  Appearance:   Casual and Neat     Behavior:  Appropriate, Sharing, and Motivated  Motor:  Normal  Speech/Language:   Normal Rate  Affect:  Depressed  Mood:  depressed  Thought process:  goal directed  Thought content:    Obsessive thoughts  Sensory/Perceptual disturbances:    WNL  Orientation:  oriented to person, place, time/date, situation, day of week, month of year, year, and stated date of Jan. 25, 2024  Attention:  Good  Concentration:  Good  Memory:  WNL  Fund of knowledge:   Good  Insight:    Good  Judgment:   Good  Impulse Control:  Good   Risk Assessment: Danger to Self:  No Self-injurious Behavior: No Danger to Others: No Duty to Warn:no Physical Aggression / Violence:No  Access to Firearms a concern: No  Gang Involvement:No   Subjective:  Patient in today reporting depression, anxiety, anger, and frustration related to personal, potential retirement, marital, family, and work. Trying to consider options as she is getting stressed and depressed over continuing to work. Has worked 58 years and "I'm tired". But also some fears about retiring and "needed to be able to vent my concerns today" which patient did in detail. Irritable, frustrated, angry, disappointed. Works for hospital system so (not all details included in this note due to patient privacy concerns.) Anger issues within family also.  Husband refused counseling. Worked on stress and anger management today as well as helped her explore some options for better self-care and ways of providing more self-nurturing in addition to being more active with others and not isolating.  Interventions: Cognitive Behavioral Therapy and Ego-Supportive  Long term  goal: Develop healthy cognitive patterns and beliefs about self and the world that lead to alleviation of depression and anxiety and help prevent relapse.  Progressing: Patient is motivated. Struggling to focus some due to immediate, unexpected health concern with husband.  Short term goal: Learn and implement personal skills for managing stress, solving daily problems, and resolving conflicts effectively. Strategy: Teach patient calming skills, problem solving skills, and conflict resolution skills, to better manage daily stressors  Diagnosis:   ICD-10-CM   1. 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  Very irritable and frustrated when she first came in but was able to talk through a lot of her stressors and she is clearly wanting to be able to retire.  (As noted above, not all details included in this note due to patient privacy needs).  Patient did really well and looking at her situation from different angles and was calmer as session ended.  Offered some suggestions about some additional insight that she might gain from local offices that could probably provide her with some helpful information she has been needing about her future plans.  To see again within 2 weeks and she knows she can call if needed.Encouraged patient in practicing more positive behaviors as discussed in session including: Using her bike and other exercise to help  deal with stress, focus on getting better sleep, taking her medications as prescribed regularly, take breaks as she needs throughout the day to experience more calmness, stay in touch with people who are supportive, work on letting go of negativity and over worrying in order to see more  positives, maintain healthy boundaries with others, and realize the strength she shows working with goal-directed behaviors to move in a direction that supports her improved emotional health and outlook.  Goal review and progress/challenges noted with patient.  Next appointment within 3 weeks.  This record has been created using [employer].  Chart creation errors have been sought, but may not always have been located and corrected.  Such creation errors do not reflect on the standard of medical care provided.   Shanon Ace, LCSW

## 2022-08-08 ENCOUNTER — Ambulatory Visit: Payer: Self-pay | Admitting: Adult Health

## 2022-08-14 ENCOUNTER — Ambulatory Visit (INDEPENDENT_AMBULATORY_CARE_PROVIDER_SITE_OTHER): Payer: Commercial Managed Care - PPO | Admitting: Psychiatry

## 2022-08-14 DIAGNOSIS — F331 Major depressive disorder, recurrent, moderate: Secondary | ICD-10-CM

## 2022-08-14 NOTE — Progress Notes (Signed)
[facility] Counselor/Therapist Progress Note  Patient ID: [name], MRN: [id number],    Date: 08/14/2022  Time Spent: 55 minutes   Treatment Type: Individual Therapy  Reported Symptoms: anxiety, depression, anger (some better), triggers to my symptoms, some agitation, low tolerance "annoyances", some irritability  Mental Status Exam:  Appearance:   Casual and Neat     Behavior:  Appropriate, Agitated, and Motivated  Motor:  Normal  Speech/Language:   Clear and Coherent  Affect:  Depressed and anxious  Mood:  anxious, depressed, and irritable  Thought process:  goal directed  Thought content:    Rumination  Sensory/Perceptual disturbances:    WNL  Orientation:  oriented to person, place, time/date, situation, day of week, month of year, year, and stated date of Feb. 1, 2024  Attention:  Good  Concentration:  Good  Memory:  WNL  Fund of knowledge:   Good  Insight:    Good  Judgment:   Good  Impulse Control:  Good   Risk Assessment: Danger to Self:  No Self-injurious Behavior: No Danger to Others: No Duty to Warn:no Physical Aggression / Violence:No  Access to Firearms a concern: No  Gang Involvement:No   Subjective: Patient in today reporting depression, anxiety, some agitation, anger (better), irritability, and low tolerance for "annoyances". Did go to dinner with friends for her birthday. Finding that she's not wanting to be around other people as much, but is not isolating as much currently but "I don't like really dealing with people." Feels most of her discouraging, anxious, depressive thoughts are connected to wanting to retire or cut back to 2 days a week in order to help her physical and emotional health. Today needed session to vent and more thoroughly process her stress, depression, anxiety, irritability in ways that were helpful for her and she did seem much less agitated and much calmer by session end.  Also discussed better self-care in reference to  her anxiety, depression, and some sleep issues.  She is also scheduled to see her med provider next week about sleep.  Stressed as she tries to consider future options about retirement and work.  Has worked 6 years and feels that she wants to retire but not sure if she can.  Is having an appointment with a financial/insurance person next week to gain more information.  Worked really well on stress and anger management today in addition to being able to hit the "pause" button as needed to interrupt some of her thoughts that are not helpful to her.  Interventions: Cognitive Behavioral Therapy and Ego-Supportive  Long term goal: Develop healthy cognitive patterns and beliefs about self and the world that lead to alleviation of depression and anxiety and help prevent relapse.  Progressing: Patient is motivated. Struggling to focus some due to immediate, unexpected health concern with husband.  Short term goal: Learn and implement personal skills for managing stress, solving daily problems, and resolving conflicts effectively. Strategy: Teach patient calming skills, problem solving skills, and conflict resolution skills, to better manage daily stressors  Diagnosis:   ICD-10-CM   1. 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  Is doing a better job of reaching  out and getting information from reliable sources, including an appointment this coming week with a financial/insurance person to gain some insight from them.  Left feeling much calmer and and feeling more stable emotionally.  Is still having some sleep issues and has appointment with her med provider next week to address that. Encouraged patient in  practicing more positive behaviors as we processed in session including: Exercise on her bike to help deal with stress, focus on getting better sleep, taking her medications as prescribed, taking breaks as she needs throughout the day to experience more calmness, deep breathing exercises, stay in touch with people who are supportive, work on letting go of negativity and over worrying in order to see more positive options for her, maintain healthy boundaries with others, and recognize the strengths she shows working with goal-directed behaviors to move in a direction that supports her improved emotional health and overall wellbeing. Goal review and progress/challenges noted with patient.  Next appointment within 2 to 3 weeks.  This record has been created using [employer].  Chart creation errors have been sought, but may not always have been located and corrected.  Such creation errors do not reflect on the standard of medical care provided.   Shanon Ace, LCSW

## 2022-08-15 ENCOUNTER — Ambulatory Visit: Payer: 59 | Admitting: Adult Health

## 2022-08-22 ENCOUNTER — Other Ambulatory Visit: Payer: Self-pay

## 2022-08-22 ENCOUNTER — Ambulatory Visit (INDEPENDENT_AMBULATORY_CARE_PROVIDER_SITE_OTHER): Payer: Commercial Managed Care - PPO | Admitting: Adult Health

## 2022-08-22 ENCOUNTER — Encounter: Payer: Self-pay | Admitting: Adult Health

## 2022-08-22 ENCOUNTER — Other Ambulatory Visit (HOSPITAL_COMMUNITY): Payer: Self-pay

## 2022-08-22 DIAGNOSIS — F4310 Post-traumatic stress disorder, unspecified: Secondary | ICD-10-CM | POA: Diagnosis not present

## 2022-08-22 DIAGNOSIS — G4700 Insomnia, unspecified: Secondary | ICD-10-CM

## 2022-08-22 DIAGNOSIS — F411 Generalized anxiety disorder: Secondary | ICD-10-CM

## 2022-08-22 DIAGNOSIS — F331 Major depressive disorder, recurrent, moderate: Secondary | ICD-10-CM

## 2022-08-22 MED ORDER — CLONIDINE HCL 0.1 MG PO TABS
0.1000 mg | ORAL_TABLET | Freq: Every day | ORAL | 2 refills | Status: DC
  Filled 2022-08-22: qty 30, 30d supply, fill #0

## 2022-08-22 MED ORDER — DULOXETINE HCL 30 MG PO CPEP
30.0000 mg | ORAL_CAPSULE | Freq: Every day | ORAL | 2 refills | Status: DC
  Filled 2022-08-22: qty 30, 30d supply, fill #0

## 2022-08-22 NOTE — Progress Notes (Signed)
[name] CG:[id number] 1956/05/25 67 y.o.  Subjective:   Patient ID:  [name] is a 67 y.o. (DOB [birth year]) female.  Chief Complaint: No chief complaint on file.   HPI [name] presents to the office today for follow-up of MDD, GAD, panic disorder, insomnia and PTSD.  Describes mood today as "about the same". Pleasant. Denies tearfulness. Mood symptoms - reports anxiety, depression, and irritability. Reports some worry - related to retirement. Denies recent panic attacks. Mood is lower. Stating - "I've been teetering". Stopped the Zoloft due to side effects - willing to consider other options. Varying interest and motivation. Seeing therapist - Rinaldo Cloud. Taking medications as prescribed.  Energy levels stable. Active, has a regular exercise routine - bike riding - 60 miles a week.  Enjoys some usual interests and activities. Married. Lives with husband of 16 years and 5 dogs. Mother lives in [place]. Spending time with family. Appetite adequate. Weight stable - 115 pounds. Sleeps better some nights than other - pain. Averages 5 to 6 hours. Focus and concentration "decent". Completing tasks. Managing aspects of household. Works full time - 3 days a week - Therapist, sports - endoscopy.  Denies SI or HI.  Denies AH or VH. Denies self harm. Denies substance use.  Previous medication trials: Remeron, Trazadone, Restoril, Xanax, Seroquel, Belsomra   GAD-7    Flowsheet Row Office Visit from 05/29/2022 in [place] at [facility] Visit from 01/01/2022 in [place] at [facility] Visit from 04/27/2019 in [place] at [facility] Visit from 12/29/2018 in [place] at [facility]  Total GAD-7 Score 11 7 11 18      $ [place] Office Visit from 12/22/2016 in [facility]  Total Score (max 30 points ) 30      PHQ2-9    [place] Visit from 05/29/2022 in [place] at [facility] Visit from 01/01/2022 in [place] at [place] Visit from 05/27/2021 in [place] at [place] Visit from 03/29/2021 in [place] at [facility] Visit from 05/23/2020 in [place] at [facility]  PHQ-2 Total Score 0 0 0 0 0  PHQ-9 Total Score 3 1 -- 0 --        Review of Systems:  Review of Systems  Musculoskeletal:  Negative for gait problem.  Neurological:  Negative for tremors.  Psychiatric/Behavioral:         Please refer to HPI    Medications: I have reviewed the patient's current medications.  Current Outpatient Medications  Medication Sig Dispense Refill   cloNIDine (CATAPRES) 0.1 MG tablet Take 1 tablet (0.1 mg total) by mouth at bedtime. 30 tablet 2   albuterol (VENTOLIN HFA) 108 (90 Base) MCG/ACT inhaler Inhale 2 puffs 15 minutes before exercise and as needed for wheeze. 6.7 g 5   ALPRAZolam (XANAX) 0.5 MG tablet Take 1 tablet (0.5 mg total) by mouth 3 (three) times daily as needed for anxiety. 90 tablet 2   amLODipine (NORVASC) 2.5 MG tablet Take 1 tablet (2.5 mg total) by mouth daily. 90 tablet 1   atenolol (TENORMIN) 25 MG tablet Take 1 tablet (25 mg total) by mouth daily. 90 tablet 3   B-D 3CC LUER-LOK SYR 25GX1" 25G X 1" 3 ML MISC USE WITH CYANOCOBALAMIN EVERY 14 DAYS  cholecalciferol (VITAMIN D) 1000 UNITS tablet Take 1,000 Units by mouth every evening.     cyanocobalamin (VITAMIN B12) 1000 MCG/ML injection Inject 1 mL (1,000 mcg total) into the muscle every 14 (fourteen) days. 6 mL 3   fluticasone (FLONASE) 50 MCG/ACT nasal spray Place 2 sprays into the nose daily. 16 g 5   hydrocortisone (ANUSOL-HC) 2.5 % rectal cream Insert rectally 2 times daily for 10 days 30 g 1   lamoTRIgine (LAMICTAL) 200 MG tablet Take 1 tablet (200 mg total) by mouth at bedtime. 90 tablet 3   meclizine  (ANTIVERT) 25 MG tablet Take 1 tablet (25 mg total) by mouth 3 (three) times daily as needed for dizziness. 30 tablet 0   ondansetron (ZOFRAN) 4 MG tablet Take 1 tablet (4 mg total) by mouth every 8 (eight) hours as needed for nausea or vomiting. 20 tablet 0   SYRINGE-NEEDLE, DISP, 3 ML (B-D 3CC LUER-LOK SYR 25GX1") 25G X 1" 3 ML MISC Use as directed to inject b-12 into the skin 6 each 3   valACYclovir (VALTREX) 500 MG tablet Take 1 tablet (500 mg total) by mouth 2 (two) times daily (12 hours apart) for 1 week 14 tablet 0   No current facility-administered medications for this visit.    Medication Side Effects: None  Allergies:  Allergies  Allergen Reactions   Aspartame And Phenylalanine Other (See Comments)    Migraines   Beef-Derived Products Other (See Comments)    severe cramping   Citrus Other (See Comments)    Causes Interstitial cystitis flares   Penicillins Hives and Rash    Denies airway involvement Has patient had a PCN reaction causing immediate rash, facial/tongue/throat swelling, SOB or lightheadedness with hypotension: yes hives.  Has patient had a PCN reaction causing severe rash involving mucus membranes or skin necrosis:no Has patient had a PCN reaction that required hospitalization No Has patient had a PCN reaction occurring within the last 10 years: No If all of the above answers are "NO", then may proceed with Cephalosporin use.    Promethazine Hcl Other (See Comments)    Muscle twitching and contracture    Beta Vulgaris Other (See Comments)    beets   Adhesive [Tape] Other (See Comments)    Red splotches   Doxycycline Nausea And Vomiting   Morphine Sulfate Nausea And Vomiting    Past Medical History:  Diagnosis Date   Allergy    Anemia    after birth of child 30yr ago   Anxiety and depression    with insomnia   [place] 08[place]   Blood transfusion without reported diagnosis    x3   Chronic back pain    ? RA, ? Lupus - reports  saw rheumatologist in the past but better now, sees Dr. PTrenton Gammon  Eczema    Flushing 05/27/2021   GERD 02/13/2007   Qualifier: Diagnosis of  By: JJenny ReichmannMD, JHunt Oris   GERD (gastroesophageal reflux disease)    takes Omeprazole daily, with nausea   Greater trochanteric bursitis of left hip 03/25/2019   Injected March 25, 2019   Heart murmur    Hemorrhoids    History of kidney stones    passed on her own   History of migraine    last one a yr ago and takes Zomig prn   Hot flashes 11/10/2014   HTN (hypertension) 03/31/2012   Hx of echocardiogram    Echo (8/15):  EF 55-60%, no RWMA  IBS (irritable bowel syndrome)    constipation predominant   Interstitial cystitis    hx of UTI   Irritable bowel syndrome 12/14/2009   Qualifier: Diagnosis of  By: Carlean Purl MD, Dimas Millin    Memory loss 12/22/2016   Numbness    both legs and related to back   Osteoporosis    osteopenia   Palpitations    on metoprolol in past but made BP too low   Pneumonia    hx of;last time 75yr ago   PONV (postoperative nausea and vomiting)    laryngospasm-1999   Poor vision 10/15/2015   -? Ocular rosacea -sees opthomology    Raynaud's disease 10/21/2012   Raynaud's disease /phenomenon 10/21/2012   no meds, worse in the winter or cold environment.    Rheumatoid aortitis    uses Diclofenac gel;Rheumatoid   Seasonal allergies    Flonase daily    Strain of left piriformis muscle 05/18/2019   Urinary incontinence     Past Medical History, Surgical history, Social history, and Family history were reviewed and updated as appropriate.   Please see review of systems for further details on the patient's review from today.   Objective:   Physical Exam:  There were no vitals taken for this visit.  Physical Exam Constitutional:      General: She is not in acute distress. Musculoskeletal:        General: No deformity.  Neurological:     Mental Status: She is alert and oriented to person, place, and  time.     Coordination: Coordination normal.  Psychiatric:        Attention and Perception: Attention and perception normal. She does not perceive auditory or visual hallucinations.        Mood and Affect: Mood normal. Mood is not anxious or depressed. Affect is not labile, blunt, angry or inappropriate.        Speech: Speech normal.        Behavior: Behavior normal.        Thought Content: Thought content normal. Thought content is not paranoid or delusional. Thought content does not include homicidal or suicidal ideation. Thought content does not include homicidal or suicidal plan.        Cognition and Memory: Cognition and memory normal.        Judgment: Judgment normal.     Comments: Insight intact     Lab Review:     Component Value Date/Time   NA 141 05/29/2022 1032   NA 143 12/22/2016 0948   K 4.8 05/29/2022 1032   CL 105 05/29/2022 1032   CO2 31 05/29/2022 1032   GLUCOSE 85 05/29/2022 1032   GLUCOSE 95 04/21/2006 1635   BUN 19 05/29/2022 1032   BUN 17 12/22/2016 0948   CREATININE 1.01 05/29/2022 1032   CALCIUM 9.5 05/29/2022 1032   PROT 6.5 05/29/2022 1032   PROT 7.0 12/22/2016 0948   ALBUMIN 4.5 05/29/2022 1032   ALBUMIN 4.7 12/22/2016 0948   AST 23 05/29/2022 1032   ALT 16 05/29/2022 1032   ALKPHOS 84 05/29/2022 1032   BILITOT 0.5 05/29/2022 1032   BILITOT 0.3 12/22/2016 0948   GFRNONAA 65 12/22/2016 0948   GFRAA 75 12/22/2016 0948       Component Value Date/Time   WBC 4.1 05/29/2022 1032   RBC 4.70 05/29/2022 1032   HGB 14.1 05/29/2022 1032   HGB 14.1 12/22/2016 0948   HCT 44.2 05/29/2022 1032   HCT 44.0 12/22/2016 0948  PLT 196.0 05/29/2022 1032   PLT 206 12/22/2016 0948   MCV 94.0 05/29/2022 1032   MCV 94 12/22/2016 0948   MCH 30.3 12/22/2016 0948   MCH 30.7 11/01/2015 1154   MCHC 32.0 05/29/2022 1032   RDW 13.8 05/29/2022 1032   RDW 14.3 12/22/2016 0948   LYMPHSABS 1.3 05/29/2022 1032   LYMPHSABS 0.9 12/22/2016 0948   MONOABS 0.4 05/29/2022  1032   EOSABS 0.2 05/29/2022 1032   EOSABS 0.1 12/22/2016 0948   BASOSABS 0.0 05/29/2022 1032   BASOSABS 0.0 12/22/2016 0948    No results found for: "POCLITH", "LITHIUM"   No results found for: "PHENYTOIN", "PHENOBARB", "VALPROATE", "CBMZ"   .res Assessment: Plan:    Plan:  PDMP reviewed  Xanax 0.39m TID - using at bedtime some nights Lamictal 2044mdaily    Add Clonidine 0.25m55mt hs for sleep  Add Cymbalta 16m47mily  D/C Zoloft 50mg28mly - tremors  RTC 6 weeks  Patient advised to contact office with any questions, adverse effects, or acute worsening in signs and symptoms.  Discussed potential benefits, risk, and side effects of benzodiazepines to include potential risk of tolerance and dependence, as well as possible drowsiness.  Advised patient not to drive if experiencing drowsiness and to take lowest possible effective dose to minimize risk of dependence and tolerance.  Diagnoses and all orders for this visit:  Insomnia, unspecified type -     cloNIDine (CATAPRES) 0.1 MG tablet; Take 1 tablet (0.1 mg total) by mouth at bedtime.     Please see After Visit Summary for patient specific instructions.  Future Appointments  Date Time Provider DeparShonto3/2024  5:00 PM Dowd,Shanon AceW [facility] None  09/02/2022  9:00 AM [facility] MM 2 [facility]  09/09/2022 10:00 AM Dowd,Shanon AceW CP-CP None  06/02/2023  9:00 AM Kuneff, Renee A, DO [facility]    No orders of the defined types were placed in this encounter.   -------------------------------

## 2022-08-25 ENCOUNTER — Ambulatory Visit: Payer: Commercial Managed Care - PPO | Admitting: Psychiatry

## 2022-08-26 ENCOUNTER — Ambulatory Visit (INDEPENDENT_AMBULATORY_CARE_PROVIDER_SITE_OTHER): Payer: Commercial Managed Care - PPO | Admitting: Psychiatry

## 2022-08-26 DIAGNOSIS — F331 Major depressive disorder, recurrent, moderate: Secondary | ICD-10-CM | POA: Diagnosis not present

## 2022-08-26 NOTE — Progress Notes (Signed)
[facility] Counselor/Therapist Progress Note  Patient ID: [name], MRN: [id number],    Date: 08/26/2022  Time Spent: 55 minutes   Treatment Type: Individual Therapy  Reported Symptoms:  depression, anxiety, irritability, frustration, some tearfulness, anger  Mental Status Exam:  Appearance:   Casual and Neat     Behavior:  Appropriate, Sharing, and Motivated  Motor:  Normal  Speech/Language:   Clear and Coherent  Affect:  Depressed  Mood:  angry, anxious, and depressed  Thought process:  goal directed  Thought content:    WNL  Sensory/Perceptual disturbances:    WNL  Orientation:  oriented to person, place, time/date, situation, day of week, month of year, year, and stated date of Feb. 13, 2024  Attention:  Good  Concentration:  Good  Memory:  WNL  Fund of knowledge:   Good  Insight:    Good  Judgment:   Good  Impulse Control:  Good   Risk Assessment: Danger to Self:  No Self-injurious Behavior: No Danger to Others: No Duty to Warn:no Physical Aggression / Violence:No  Access to Firearms a concern: No  Gang Involvement:No   Subjective:  Patient in today reporting anxiety, depression, irritability, anger, frustration. Motivation improving.  Impulse control good. Husband has been better mentally and in helping around at home. Lots of anxiety especially related to work, eventual retirement, and family.  Went to lunch with some friends, as she is trying to not isolate as much. Communication with husband is some better. Retirement issues are a real stressor and patient trying to manage her stress more effectively. Worries about finances in the future processed this more in session today. Insight is strengthening. Working hard to hang onto some emotional gains she is making in terms of her outlook, and in her relationships with husband and others.  Really encouraged her to continue working on not isolating and she agreed.  Continued exercise which is healthy for her.   Working to better manage her stress level and take breaks as needed.  Interventions: Cognitive Behavioral Therapy and Ego-Supportive  Long term goal: Develop healthy cognitive patterns and beliefs about self and the world that lead to alleviation of depression and anxiety and help prevent relapse.  Progressing: Patient is motivated. Struggling to focus some due to immediate, unexpected health concern with husband.  Short term goal: Learn and implement personal skills for managing stress, solving daily problems, and resolving conflicts effectively. Strategy: Teach patient calming skills, problem solving skills, and conflict resolution skills, to better manage daily stressors  Diagnosis:   ICD-10-CM   1. 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  Improvement in her motivation is quite noticeable.  Impulse control is also good.  Is making gains and hoping to hang onto them.  Discussed ways of making that happen.  Needs to continue with goal-directed behaviors so as to continue her progress and keep moving in an emotionally healthier direction.  More calm today but still having some of the symptoms noted above and states "some days are better".  Did get some medication from her med provider regarding improved sleep as noted per last session. Encouraged patient in her practice of more positive behaviors as we discussed in session including: Focus on getting better sleep, taking her medications as prescribed, exercising on her bike to help deal with stress, taking  breaks as she needs throughout the day to experience more calmness, deep breathing exercises, staying in touch with supportive people, work on letting go of negativity and over worrying in order to see more positive options for herself, maintain healthy boundaries with others, and  realize the strength she shows when working with goal-directed behaviors to move in a direction that supports her improved emotional health and outlook.  Goal review and progress/challenges noted with patient.  Next appointment within 2 to 3 weeks.  This record has been created using [employer].  Chart creation errors have been sought, but may not always have been located and corrected.  Such creation errors do not reflect on the standard of medical care provided.   Shanon Ace, LCSW

## 2022-08-27 DIAGNOSIS — L219 Seborrheic dermatitis, unspecified: Secondary | ICD-10-CM | POA: Diagnosis not present

## 2022-08-27 DIAGNOSIS — L570 Actinic keratosis: Secondary | ICD-10-CM | POA: Diagnosis not present

## 2022-08-27 DIAGNOSIS — L814 Other melanin hyperpigmentation: Secondary | ICD-10-CM | POA: Diagnosis not present

## 2022-08-27 DIAGNOSIS — D239 Other benign neoplasm of skin, unspecified: Secondary | ICD-10-CM | POA: Diagnosis not present

## 2022-08-27 DIAGNOSIS — L821 Other seborrheic keratosis: Secondary | ICD-10-CM | POA: Diagnosis not present

## 2022-08-27 DIAGNOSIS — L578 Other skin changes due to chronic exposure to nonionizing radiation: Secondary | ICD-10-CM | POA: Diagnosis not present

## 2022-08-28 ENCOUNTER — Encounter: Payer: Self-pay | Admitting: Sports Medicine

## 2022-08-28 ENCOUNTER — Other Ambulatory Visit (HOSPITAL_COMMUNITY): Payer: Self-pay

## 2022-08-28 MED ORDER — FLUOROURACIL 5 % EX CREA
1.0000 "application " | TOPICAL_CREAM | Freq: Every day | CUTANEOUS | 0 refills | Status: AC
  Filled 2022-08-28: qty 40, 20d supply, fill #0
  Filled 2022-08-28: qty 40, 14d supply, fill #0

## 2022-08-28 MED ORDER — KETOCONAZOLE 2 % EX SHAM
MEDICATED_SHAMPOO | CUTANEOUS | 5 refills | Status: AC
  Filled 2022-08-28 (×2): qty 120, 30d supply, fill #0

## 2022-08-29 ENCOUNTER — Other Ambulatory Visit: Payer: Self-pay | Admitting: Sports Medicine

## 2022-08-29 DIAGNOSIS — M5030 Other cervical disc degeneration, unspecified cervical region: Secondary | ICD-10-CM

## 2022-08-29 DIAGNOSIS — M4802 Spinal stenosis, cervical region: Secondary | ICD-10-CM

## 2022-08-29 NOTE — Telephone Encounter (Signed)
Referral for epidural placed patient should follow up 2 weeks after

## 2022-09-02 ENCOUNTER — Ambulatory Visit
Admission: RE | Admit: 2022-09-02 | Discharge: 2022-09-02 | Disposition: A | Discharge status: Home / Self Care | Payer: Commercial Managed Care - PPO | Source: Ambulatory Visit | Attending: Family Medicine | Admitting: Family Medicine

## 2022-09-02 DIAGNOSIS — Z1231 Encounter for screening mammogram for malignant neoplasm of breast: Secondary | ICD-10-CM | POA: Diagnosis not present

## 2022-09-05 ENCOUNTER — Telehealth: Payer: Self-pay

## 2022-09-05 NOTE — Telephone Encounter (Signed)
EPIC froze while charting telephone note.  Patient states she is returning [name] call.  I told patient Lorriane Shire not in office today.  I could not find any notes. Patient said to leave message for Lorriane Shire until she returns on Monday

## 2022-09-05 NOTE — Telephone Encounter (Signed)
Spoke with pt regarding labs and instructions.   

## 2022-09-07 ENCOUNTER — Encounter: Payer: Self-pay | Admitting: Family Medicine

## 2022-09-08 ENCOUNTER — Telehealth: Payer: Self-pay | Admitting: Family Medicine

## 2022-09-08 ENCOUNTER — Encounter: Payer: Self-pay | Admitting: Family Medicine

## 2022-09-08 ENCOUNTER — Ambulatory Visit (INDEPENDENT_AMBULATORY_CARE_PROVIDER_SITE_OTHER): Payer: Commercial Managed Care - PPO | Admitting: Family Medicine

## 2022-09-08 VITALS — BP 122/80 | HR 96 | Temp 98.7°F | Resp 17 | Ht 62.0 in | Wt 113.2 lb

## 2022-09-08 DIAGNOSIS — J189 Pneumonia, unspecified organism: Secondary | ICD-10-CM

## 2022-09-08 DIAGNOSIS — R6889 Other general symptoms and signs: Secondary | ICD-10-CM

## 2022-09-08 DIAGNOSIS — Z1152 Encounter for screening for COVID-19: Secondary | ICD-10-CM | POA: Diagnosis not present

## 2022-09-08 LAB — POC COVID19 BINAXNOW: SARS Coronavirus 2 Ag: NEGATIVE — AB

## 2022-09-08 LAB — POCT INFLUENZA A/B
Influenza A, POC: NEGATIVE
Influenza B, POC: NEGATIVE

## 2022-09-08 MED ORDER — ALBUTEROL SULFATE HFA 108 (90 BASE) MCG/ACT IN AERS: INHALATION_SPRAY | RESPIRATORY_TRACT | 5 refills | Status: DC

## 2022-09-08 MED ORDER — GUAIFENESIN-CODEINE 100-10 MG/5ML PO SYRP: 10.0000 mL | ORAL_SOLUTION | Freq: Three times a day (TID) | ORAL | 0 refills | Status: DC | PRN

## 2022-09-08 MED ORDER — AZITHROMYCIN 250 MG PO TABS: ORAL_TABLET | ORAL | 0 refills | Status: DC

## 2022-09-08 NOTE — Telephone Encounter (Signed)
Notified [name] that DR Birdie Riddle has sent the Rx to [facility]

## 2022-09-08 NOTE — Telephone Encounter (Signed)
Pt now states she just can't drive to [facility] please send to [facility] [address] [place] [address] [place], [place] [address]

## 2022-09-08 NOTE — Telephone Encounter (Signed)
Can we send this to [facility] ? [facility] is on backorder

## 2022-09-08 NOTE — Telephone Encounter (Signed)
Caller name: TANMAYI PENDLEY  On DPR?: Yes  Call back number: [phone] (mobile)  Provider they see: Annye Asa  Reason for call: [facility] and cough syrup -guaiFENesin-codeine (ROBITUSSIN AC) 100-10 MG/5ML syrup is on backorder. She would like it sent to [facility].

## 2022-09-08 NOTE — Telephone Encounter (Signed)
Prescription sent to pt's preferred pharmacy

## 2022-09-08 NOTE — Patient Instructions (Signed)
Follow up as needed or as scheduled START the Zpack as directed- 2 pills today and then 1 pill daily USE the cough syrup as needed- may cause drowsiness REST!!! Drink LOTS of fluids!!! USE the Albuterol inhaler as needed for coughing fits or shortness of breath Alternate Tylenol and ibuprofen as needed for fever, body aches, headache, etc IF your symptoms change or worsen, PLEASE let us know Call with any questions or concerns Hang in there!!!

## 2022-09-08 NOTE — Progress Notes (Signed)
   Subjective:    Patient ID: KRISTYNA ABDELLA, female    DOB: [birth year], 67 y.o.   MRN: [id number]  HPI Fever- pt reports she had COVID in January.  Developed fatigue, congestion, sore throat, fever to 101.  Pt reports eating and drinking but is losing weight.  + sick contacts.  + body aches, chills.  Mild HA.  Cough is dry, non productive.  Throat remains sore.  Some nasal congestion but denies sinus pain/pressure.     Review of Systems For ROS see HPI     Objective:   Physical Exam Vitals reviewed.  Constitutional:      General: She is not in acute distress.    Appearance: She is well-developed. She is ill-appearing.  HENT:     Head: Normocephalic and atraumatic.     Right Ear: Tympanic membrane and ear canal normal.     Left Ear: Tympanic membrane and ear canal normal.     Nose: Congestion present. No rhinorrhea.     Mouth/Throat:     Mouth: Mucous membranes are moist.     Pharynx: Posterior oropharyngeal erythema (w/ PND) present.  Eyes:     Conjunctiva/sclera: Conjunctivae normal.     Pupils: Pupils are equal, round, and reactive to light.  Cardiovascular:     Rate and Rhythm: Normal rate and regular rhythm.     Heart sounds: Normal heart sounds. No murmur heard. Pulmonary:     Effort: Pulmonary effort is normal. No respiratory distress.     Breath sounds: Rhonchi (faint crackles at R base, CTA on L) present. No wheezing.  Musculoskeletal:     Cervical back: Normal range of motion and neck supple.  Lymphadenopathy:     Cervical: No cervical adenopathy.  Skin:    General: Skin is warm and dry.  Neurological:     General: No focal deficit present.     Mental Status: She is alert and oriented to person, place, and time.  Psychiatric:        Mood and Affect: Mood normal.        Behavior: Behavior normal.        Thought Content: Thought content normal.           Assessment & Plan:  CAP- new.  Pt's flu test was negative.  COVID test has just the ever so lightest  of lines- which makes more sense since she had it last month.  My concern is the intensity of the cough and the faint crackles at the R lower lobe w/ temp up to 101.  Will treat as CAP w/ Zpack as pt is allergic to PCN and intolerant to Doxy.  Cough meds prn.  Reviewed supportive care and red flags that should prompt return.  Pt expressed understanding and is in agreement w/ plan.

## 2022-09-08 NOTE — Telephone Encounter (Signed)
Correction pt called crying stating she can not drive to [facility] after she requested that she is now asking for it to be sent [place] [facility] [place]

## 2022-09-08 NOTE — Telephone Encounter (Signed)
Note has been sent to DR Birdie Riddle to resend in [employer]

## 2022-09-09 ENCOUNTER — Ambulatory Visit: Payer: Commercial Managed Care - PPO | Admitting: Psychiatry

## 2022-09-16 ENCOUNTER — Encounter: Payer: Self-pay | Admitting: Family Medicine

## 2022-09-16 ENCOUNTER — Other Ambulatory Visit: Payer: Self-pay | Admitting: Family Medicine

## 2022-09-16 ENCOUNTER — Other Ambulatory Visit: Payer: Self-pay | Admitting: Adult Health

## 2022-09-16 DIAGNOSIS — F411 Generalized anxiety disorder: Secondary | ICD-10-CM

## 2022-09-16 NOTE — Telephone Encounter (Signed)
Should patient schedule a follow up given continued symptoms? Or if there anything else to recomend at this time?

## 2022-09-17 ENCOUNTER — Other Ambulatory Visit (HOSPITAL_COMMUNITY): Payer: Self-pay

## 2022-09-17 ENCOUNTER — Other Ambulatory Visit: Payer: Self-pay

## 2022-09-17 MED ORDER — ALPRAZOLAM 0.5 MG PO TABS
0.5000 mg | ORAL_TABLET | Freq: Three times a day (TID) | ORAL | 2 refills | Status: DC | PRN
  Filled 2022-09-17: qty 90, 30d supply, fill #0
  Filled 2023-01-21: qty 90, 30d supply, fill #1

## 2022-09-18 ENCOUNTER — Ambulatory Visit: Payer: Commercial Managed Care - PPO | Admitting: Adult Health

## 2022-09-30 ENCOUNTER — Ambulatory Visit
Admission: RE | Admit: 2022-09-30 | Discharge: 2022-09-30 | Disposition: A | Discharge status: Home / Self Care | Payer: Commercial Managed Care - PPO | Source: Ambulatory Visit | Attending: Sports Medicine | Admitting: Sports Medicine

## 2022-09-30 DIAGNOSIS — M5030 Other cervical disc degeneration, unspecified cervical region: Secondary | ICD-10-CM

## 2022-09-30 DIAGNOSIS — M4727 Other spondylosis with radiculopathy, lumbosacral region: Secondary | ICD-10-CM | POA: Diagnosis not present

## 2022-09-30 DIAGNOSIS — Z981 Arthrodesis status: Secondary | ICD-10-CM | POA: Diagnosis not present

## 2022-09-30 MED ORDER — IOPAMIDOL (ISOVUE-M 200) INJECTION 41%
1.0000 mL | Freq: Once | INTRAMUSCULAR | Status: AC
  Administered 2022-09-30: 1 mL via EPIDURAL

## 2022-09-30 MED ORDER — METHYLPREDNISOLONE ACETATE 40 MG/ML INJ SUSP (RADIOLOG
80.0000 mg | Freq: Once | INTRAMUSCULAR | Status: AC
  Administered 2022-09-30: 80 mg via EPIDURAL

## 2022-09-30 NOTE — Discharge Instructions (Signed)

## 2022-10-01 ENCOUNTER — Ambulatory Visit (INDEPENDENT_AMBULATORY_CARE_PROVIDER_SITE_OTHER): Payer: Commercial Managed Care - PPO | Admitting: Psychiatry

## 2022-10-01 DIAGNOSIS — F331 Major depressive disorder, recurrent, moderate: Secondary | ICD-10-CM

## 2022-10-01 NOTE — Progress Notes (Signed)
[facility] Counselor/Therapist Progress Note  Patient ID: [name], MRN: [id number],    Date: 10/01/2022  Time Spent: 55 minutes   Treatment Type: Individual Therapy  Reported Symptoms: anxiety, depression, frustration  Mental Status Exam:  Appearance:   Casual     Behavior:  Appropriate, Sharing, and Motivated  Motor:  Normal  Speech/Language:   Clear and Coherent  Affect:  Depressed and anxious  Mood:  anxious and depressed  Thought process:  goal directed  Thought content:    WNL  Sensory/Perceptual disturbances:    WNL  Orientation:  oriented to person, place, time/date, situation, day of week, month of year, year, and stated date of October 01, 2022  Attention:  Good  Concentration:  Good  Memory:  WNL  Fund of knowledge:   Good  Insight:    Good  Judgment:   Good  Impulse Control:  Good   Risk Assessment: Danger to Self:  No Self-injurious Behavior: No Danger to Others: No Duty to Warn:no Physical Aggression / Violence:No  Access to Firearms a concern: No  Gang Involvement:No   Subjective: Patient in session today reporting depression, anxiety, and frustration mostly related to recent illness, personal and family issues, and financial stressors. Had pneumonia recently which was very difficult for her. Talked more with sister several times and "that has helped bring [place] closer together. Felt good that mom and sister called her daily when she had pneumonia. Is well now and back to work. Very frustrated recently due to physical pain, dealing with several household fixtures needing repair due to age, and having worsening arthritic pain. Is looking at reducing her work hours some  and make some other life adjustments that she feels will benefit her mental health and overall outlook. Good that she is able to see more positives today despite still struggling with some depression ad anxiety. Feels that with spring coming on she will likely feel more benefit in her  mood with added sunlight hours and knowing that in the near future she is also making changes in her work hours.  Encouraged patient in her getting out more which she has done some recently after she recovered from pneumonia.  Is getting back into her exercise routine after having been sick.  Interventions: Cognitive Behavioral Therapy and Ego-Supportive  Long term goal: Develop healthy cognitive patterns and beliefs about self and the world that lead to alleviation of depression and anxiety and help prevent relapse.  Progressing: Patient is motivated. Struggling to focus some due to immediate, unexpected health concern with husband.  Short term goal: Learn and implement personal skills for managing stress, solving daily problems, and resolving conflicts effectively. Strategy: Teach patient calming skills, problem solving skills, and conflict resolution skills, to better manage daily stressors  Diagnosis:   ICD-10-CM   1. 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  Seem to be more grounded today emotionally as she worked on these issues and less volatile and her mood.  Also taking steps that were proactive versus reactive.  Is making progress and needs to continue working with goal-directed behaviors in order to keep moving in a forward direction.  Showed more confidence today.  Motivation improving.  Discussed with patient some ways of better holding onto some of the gains that she has made emotionally.  Seems  to be doing better and marital relationship in terms of communication and pulling together.Encouraged patient and practicing more positive and self affirming behaviors as discussed in session including: Allowing for better sleep patterns, taking her meds as prescribed, exercising on her bike to help deal with  stress, taking breaks as she needs throughout the day to experience more calmness, deep breathing exercises, staying in touch with supportive people, working on letting go of negativity and over worrying in order to see more positive options for herself, maintain healthy boundaries with others, and recognize the strengths she shows when working with goal-directed behaviors to move in a direction that supports her improved emotional health and outlook.  Goal review and progress noted with patient.  Next appt within 3 weeks.  This record has been created using [employer].  Chart creation errors have been sought, but may not always have been located and corrected.  Such creation errors do not reflect on the standard of medical care provided.   Shanon Ace, LCSW

## 2022-10-22 NOTE — Progress Notes (Unsigned)
[name] D.[facility] [address] [place] [address] Phone: [phone]   Assessment and Plan:     There are no diagnoses linked to this encounter.  ***   Pertinent previous records reviewed include ***   Follow Up: ***     Subjective:   I, [name], am serving as a Neurosurgeon for Doctor [name]   Chief Complaint: neck pain    HPI:    11/20/2021 Patient is a 67 year old female complaining of neck pain. Patient states that the pain wakes her up in the middle of the night it feels like its crunching and she cant move it to the left some times she is an avid cyclist , not a constant pain bothers her most at night , states its arthritis she has it every where , has been taking ib and it doesn't touch the pain , does get  numbness and tingling going down has been going on for at least a couple of years   12/19/2021 Patient states that her neck is sore had a bad muscle spasm that went from her shoulder to her ear to the side of her head she had to call out of work it came out of no where    10/23/2022 Patient states    Relevant Historical Information: Osteoarthritis of multiple joints, IBS, GERD, Raynaud's  Additional pertinent review of systems negative.   Current Outpatient Medications:    albuterol (VENTOLIN HFA) 108 (90 Base) MCG/ACT inhaler, Inhale 2 puffs 15 minutes before exercise and as needed for wheeze., Disp: 6.7 g, Rfl: 5   ALPRAZolam (XANAX) 0.5 MG tablet, Take 1 tablet (0.5 mg total) by mouth 3 (three) times daily as needed for anxiety., Disp: 90 tablet, Rfl: 2   amLODipine (NORVASC) 2.5 MG tablet, Take 1 tablet (2.5 mg total) by mouth daily., Disp: 90 tablet, Rfl: 1   atenolol (TENORMIN) 25 MG tablet, Take 1 tablet (25 mg total) by mouth daily., Disp: 90 tablet, Rfl: 3   azithromycin (ZITHROMAX) 250 MG tablet, 2 tabs on day 1, 1 tab on day 2-5, Disp: 6 tablet, Rfl: 0   B-D 3CC LUER-LOK SYR 25GX1" 25G X 1" 3  ML MISC, USE WITH CYANOCOBALAMIN EVERY 14 DAYS, Disp: , Rfl:    cholecalciferol (VITAMIN D) 1000 UNITS tablet, Take 1,000 Units by mouth every evening., Disp: , Rfl:    cloNIDine (CATAPRES) 0.1 MG tablet, Take 1 tablet (0.1 mg total) by mouth at bedtime., Disp: 30 tablet, Rfl: 2   cyanocobalamin (VITAMIN B12) 1000 MCG/ML injection, Inject 1 mL (1,000 mcg total) into the muscle every 14 (fourteen) days., Disp: 6 mL, Rfl: 3   DULoxetine (CYMBALTA) 30 MG capsule, Take 1 capsule (30 mg total) by mouth daily. (Patient not taking: Reported on 09/08/2022), Disp: 30 capsule, Rfl: 2   fluticasone (FLONASE) 50 MCG/ACT nasal spray, Place 2 sprays into the nose daily., Disp: 16 g, Rfl: 5   guaiFENesin-codeine (ROBITUSSIN AC) 100-10 MG/5ML syrup, Take 10 mLs by mouth 3 (three) times daily as needed for cough., Disp: 120 mL, Rfl: 0   hydrocortisone (ANUSOL-HC) 2.5 % rectal cream, Insert rectally 2 times daily for 10 days, Disp: 30 g, Rfl: 1   ketoconazole (NIZORAL) 2 % shampoo, Use topically as directed for 30 days, Disp: 120 mL, Rfl: 5   lamoTRIgine (LAMICTAL) 200 MG tablet, Take 1 tablet (200 mg total) by mouth at bedtime., Disp: 90 tablet, Rfl: 3   meclizine (  ANTIVERT) 25 MG tablet, Take 1 tablet (25 mg total) by mouth 3 (three) times daily as needed for dizziness., Disp: 30 tablet, Rfl: 0   ondansetron (ZOFRAN) 4 MG tablet, Take 1 tablet (4 mg total) by mouth every 8 (eight) hours as needed for nausea or vomiting., Disp: 20 tablet, Rfl: 0   SYRINGE-NEEDLE, DISP, 3 ML (B-D 3CC LUER-LOK SYR 25GX1") 25G X 1" 3 ML MISC, Use as directed to inject b-12 into the skin, Disp: 6 each, Rfl: 3   valACYclovir (VALTREX) 500 MG tablet, Take 1 tablet (500 mg total) by mouth 2 (two) times daily (12 hours apart) for 1 week, Disp: 14 tablet, Rfl: 0   Objective:     There were no vitals filed for this visit.    There is no height or weight on file to calculate BMI.    Physical Exam:    ***   Electronically signed by:   [name] D[facility] 7:34 AM 10/22/22

## 2022-10-23 ENCOUNTER — Ambulatory Visit: Payer: Commercial Managed Care - PPO | Admitting: Sports Medicine

## 2022-10-23 ENCOUNTER — Other Ambulatory Visit (HOSPITAL_COMMUNITY): Payer: Self-pay

## 2022-10-23 ENCOUNTER — Encounter: Payer: Self-pay | Admitting: Sports Medicine

## 2022-10-23 VITALS — BP 110/78 | HR 93 | Ht 62.0 in | Wt 112.8 lb

## 2022-10-23 DIAGNOSIS — M51369 Other intervertebral disc degeneration, lumbar region without mention of lumbar back pain or lower extremity pain: Secondary | ICD-10-CM

## 2022-10-23 DIAGNOSIS — G8929 Other chronic pain: Secondary | ICD-10-CM | POA: Diagnosis not present

## 2022-10-23 DIAGNOSIS — M25552 Pain in left hip: Secondary | ICD-10-CM

## 2022-10-23 DIAGNOSIS — M5136 Other intervertebral disc degeneration, lumbar region: Secondary | ICD-10-CM | POA: Diagnosis not present

## 2022-10-23 DIAGNOSIS — S73192A Other sprain of left hip, initial encounter: Secondary | ICD-10-CM

## 2022-10-23 DIAGNOSIS — M48062 Spinal stenosis, lumbar region with neurogenic claudication: Secondary | ICD-10-CM | POA: Diagnosis not present

## 2022-10-23 NOTE — Patient Instructions (Signed)
Follow up as needed

## 2022-10-24 ENCOUNTER — Other Ambulatory Visit (HOSPITAL_COMMUNITY): Payer: Self-pay

## 2022-10-24 ENCOUNTER — Ambulatory Visit (INDEPENDENT_AMBULATORY_CARE_PROVIDER_SITE_OTHER): Payer: Commercial Managed Care - PPO | Admitting: Psychiatry

## 2022-10-24 ENCOUNTER — Ambulatory Visit (INDEPENDENT_AMBULATORY_CARE_PROVIDER_SITE_OTHER): Payer: Commercial Managed Care - PPO | Admitting: Adult Health

## 2022-10-24 ENCOUNTER — Other Ambulatory Visit: Payer: Self-pay

## 2022-10-24 ENCOUNTER — Encounter: Payer: Self-pay | Admitting: Adult Health

## 2022-10-24 DIAGNOSIS — F410 Panic disorder [episodic paroxysmal anxiety] without agoraphobia: Secondary | ICD-10-CM | POA: Diagnosis not present

## 2022-10-24 DIAGNOSIS — F331 Major depressive disorder, recurrent, moderate: Secondary | ICD-10-CM

## 2022-10-24 DIAGNOSIS — F411 Generalized anxiety disorder: Secondary | ICD-10-CM

## 2022-10-24 DIAGNOSIS — F4310 Post-traumatic stress disorder, unspecified: Secondary | ICD-10-CM | POA: Diagnosis not present

## 2022-10-24 DIAGNOSIS — G4700 Insomnia, unspecified: Secondary | ICD-10-CM | POA: Diagnosis not present

## 2022-10-24 MED ORDER — QUETIAPINE FUMARATE 50 MG PO TABS
50.0000 mg | ORAL_TABLET | Freq: Every day | ORAL | 2 refills | Status: DC
  Filled 2022-10-24 (×2): qty 30, 30d supply, fill #0

## 2022-10-24 NOTE — Progress Notes (Signed)
[name] [id number] [birth year] 67 y.o.  Subjective:   Patient ID:  [name] is a 67 y.o. (DOB 06-May-1956) female.  Chief Complaint: No chief complaint on file.   HPI [name] presents to the office today for follow-up of MDD, GAD, panic disorder, insomnia and PTSD.  Describes mood today as "not good". Pleasant. Reports tearfulness. Mood symptoms - reports anxiety, depression, and irritability. Reports some worry, rumination, and over thinking - more so at night. Stating "I can't shut my mind off". Reports panic attacks. Mood is lower. Stating - "I'm not doing well".   Varying interest and motivation. Seeing therapist - Rockne Menghini. Taking medications as prescribed.  Energy levels stable. Active, has a regular exercise routine - biking. Enjoys some usual interests and activities. Married. Lives with husband of 16 years and 5 dogs. Mother lives in [place]. Spending time with family. Appetite adequate. Weight loss - 3 pounds - 112 from 115 pounds. Sleeps better some nights than other - worry, rumination, family stressors. Averages 5 to 6 hours on work nights and sometimes longer when not working. Focus and concentration difficulties - getting very distracted - home and work. Completing tasks. Managing aspects of household. 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  Denies SI or HI.  Denies AH or VH. Denies self harm. Denies substance use.  Previous medication trials: Remeron, Trazadone, Restoril, Xanax, Seroquel, Belsomra  GAD-7    Flowsheet Row Office Visit from 05/29/2022 in [facility] [place] [facility] Visit from 01/01/2022 in [facility] [address] [facility] Office Visit from 04/27/2019 in [facility] Office Visit from 12/29/2018 in [facility]  Total GAD-7 Score 11 7 11 18       Mini-Mental    Flowsheet Row Office Visit from 12/22/2016 in [facility]  Total Score (max 30 points ) 30      PHQ2-9    Flowsheet Row Office Visit from 09/08/2022 in [facility] [place] [facility] Visit from 05[facility]29/2022 in [facility] Office Visit from 01/01/2022 in [facility] [facility] [place] Office Visit from 05/27/2021 in [facility] [place] Office Visit from 03/29/2021 in [facility] Total Score 1 0 0 0 0  PHQ-9 Total Score 6 3 1  -- 0        Review of Systems:  Review of Systems  Musculoskeletal:  Negative for gait problem.  Neurological:  Negative for tremors.  Psychiatric/Behavioral:         Please refer to HPI    Medications: I have reviewed the patient's current medications.  Current Outpatient Medications  Medication Sig Dispense Refill   QUEtiapine (SEROQUEL) 50 MG tablet Take 1 tablet (50 mg total) by mouth at bedtime. 30 tablet 2   albuterol (VENTOLIN HFA) 108 (90 Base) MCG/ACT inhaler Inhale 2 puffs 15 minutes before exercise and as needed for wheeze. 6.7 g 5   ALPRAZolam (XANAX) 0.5 MG tablet Take 1 tablet (0.5 mg total) by mouth 3 (three) times daily as needed for anxiety. 90 tablet 2   amLODipine (NORVASC) 2.5 MG tablet Take 1 tablet (2.5 mg total) by mouth daily. 90 tablet 1   atenolol (TENORMIN) 25 MG tablet Take 1 tablet (25 mg total) by mouth daily. 90 tablet 3  azithromycin (ZITHROMAX) 250 MG tablet 2 tabs on day 1, 1 tab on day 2-5 6 tablet 0   B-D 3CC LUER-LOK SYR 25GX1" 25G X 1" 3 ML MISC USE WITH CYANOCOBALAMIN EVERY 14 DAYS     cholecalciferol (VITAMIN D) 1000 UNITS tablet Take 1,000 Units by mouth every evening.     cloNIDine (CATAPRES) 0.1 MG tablet Take 1 tablet (0.1 mg total) by mouth at bedtime. 30 tablet 2   cyanocobalamin (VITAMIN B12) 1000 MCG/ML injection Inject 1 mL (1,000 mcg total) into the muscle every 14 (fourteen) days. 6 mL 3    DULoxetine (CYMBALTA) 30 MG capsule Take 1 capsule (30 mg total) by mouth daily. 30 capsule 2   fluticasone (FLONASE) 50 MCG/ACT nasal spray Place 2 sprays into the nose daily. 16 g 5   guaiFENesin-codeine (ROBITUSSIN AC) 100-10 MG/5ML syrup Take 10 mLs by mouth 3 (three) times daily as needed for cough. 120 mL 0   hydrocortisone (ANUSOL-HC) 2.5 % rectal cream Insert rectally 2 times daily for 10 days 30 g 1   ketoconazole (NIZORAL) 2 % shampoo Use topically as directed for 30 days 120 mL 5   lamoTRIgine (LAMICTAL) 200 MG tablet Take 1 tablet (200 mg total) by mouth at bedtime. 90 tablet 3   meclizine (ANTIVERT) 25 MG tablet Take 1 tablet (25 mg total) by mouth 3 (three) times daily as needed for dizziness. 30 tablet 0   ondansetron (ZOFRAN) 4 MG tablet Take 1 tablet (4 mg total) by mouth every 8 (eight) hours as needed for nausea or vomiting. 20 tablet 0   SYRINGE-NEEDLE, DISP, 3 ML (B-D 3CC LUER-LOK SYR 25GX1") 25G X 1" 3 ML MISC Use as directed to inject b-12 into the skin 6 each 3   valACYclovir (VALTREX) 500 MG tablet Take 1 tablet (500 mg total) by mouth 2 (two) times daily (12 hours apart) for 1 week 14 tablet 0   No current facility-administered medications for this visit.    Medication Side Effects: None  Allergies:  Allergies  Allergen Reactions   Aspartame And Phenylalanine Other (See Comments)    Migraines   Beef-Derived Products Other (See Comments)    severe cramping   Citrus Other (See Comments)    Causes Interstitial cystitis flares   Penicillins Hives and Rash    Denies airway involvement Has patient had a PCN reaction causing immediate rash, facial/tongue/throat swelling, SOB or lightheadedness with hypotension: yes hives.  Has patient had a PCN reaction causing severe rash involving mucus membranes or skin necrosis:no Has patient had a PCN reaction that required hospitalization No Has patient had a PCN reaction occurring within the last 10 years: No If all of the  above answers are "NO", then may proceed with Cephalosporin use.    Promethazine Hcl Other (See Comments)    Muscle twitching and contracture    Beta Vulgaris Other (See Comments)    beets   Adhesive [Tape] Other (See Comments)    Red splotches   Doxycycline Nausea And Vomiting   Morphine Sulfate Nausea And Vomiting    Past Medical History:  Diagnosis Date   Allergy    Anemia    after birth of child 64yrs ago   Anxiety and depression    with insomnia   Autoimmune disease 08/06/2020   Blood transfusion without reported diagnosis    x3   Chronic back pain    ? RA, ? Lupus - reports saw rheumatologist in the past but better now, sees Dr.  Poole   Eczema    Flushing 05/27/2021   GERD 02/13/2007   Qualifier: Diagnosis of  By: Jonny Ruiz MD, Len Blalock    GERD (gastroesophageal reflux disease)    takes Omeprazole daily, with nausea   Greater trochanteric bursitis of left hip 03/25/2019   Injected March 25, 2019   Heart murmur    Hemorrhoids    History of kidney stones    passed on her own   History of migraine    last one a yr ago and takes Zomig prn   Hot flashes 11/10/2014   HTN (hypertension) 03/31/2012   Hx of echocardiogram    Echo (8/15):  EF 55-60%, no RWMA   IBS (irritable bowel syndrome)    constipation predominant   Interstitial cystitis    hx of UTI   Irritable bowel syndrome 12/14/2009   Qualifier: Diagnosis of  By: Leone Payor MD, Charlyne Quale    Memory loss 12/22/2016   Numbness    both legs and related to back   Osteoporosis    osteopenia   Palpitations    on metoprolol in past but made BP too low   Pneumonia    hx of;last time 52yrs ago   PONV (postoperative nausea and vomiting)    laryngospasm-1999   Poor vision 10/15/2015   -? Ocular rosacea -sees opthomology    Raynaud's disease 10/21/2012   Raynaud's disease /phenomenon 10/21/2012   no meds, worse in the winter or cold environment.    Rheumatoid aortitis    uses Diclofenac gel;Rheumatoid    Seasonal allergies    Flonase daily    Strain of left piriformis muscle 05/18/2019   Urinary incontinence     Past Medical History, Surgical history, Social history, and Family history were reviewed and updated as appropriate.   Please see review of systems for further details on the patient's review from today.   Objective:   Physical Exam:  There were no vitals taken for this visit.  Physical Exam Constitutional:      General: She is not in acute distress. Musculoskeletal:        General: No deformity.  Neurological:     Mental Status: She is alert and oriented to person, place, and time.     Coordination: Coordination normal.  Psychiatric:        Attention and Perception: Attention and perception normal. She does not perceive auditory or visual hallucinations.        Mood and Affect: Mood normal. Mood is not anxious or depressed. Affect is not labile, blunt, angry or inappropriate.        Speech: Speech normal.        Behavior: Behavior normal.        Thought Content: Thought content normal. Thought content is not paranoid or delusional. Thought content does not include homicidal or suicidal ideation. Thought content does not include homicidal or suicidal plan.        Cognition and Memory: Cognition and memory normal.        Judgment: Judgment normal.     Comments: Insight intact     Lab Review:     Component Value Date/Time   NA 141 05/29/2022 1032   NA 143 12/22/2016 0948   K 4.8 05/29/2022 1032   CL 105 05/29/2022 1032   CO2 31 05/29/2022 1032   GLUCOSE 85 05/29/2022 1032   GLUCOSE 95 04/21/2006 1635   BUN 19 05/29/2022 1032   BUN 17 12/22/2016 0948   CREATININE  1.01 05/29/2022 1032   CALCIUM 9.5 05/29/2022 1032   PROT 6.5 05/29/2022 1032   PROT 7.0 12/22/2016 0948   ALBUMIN 4.5 05/29/2022 1032   ALBUMIN 4.7 12/22/2016 0948   AST 23 05/29/2022 1032   ALT 16 05/29/2022 1032   ALKPHOS 84 05/29/2022 1032   BILITOT 0.5 05/29/2022 1032   BILITOT 0.3  12/22/2016 0948   GFRNONAA 65 12/22/2016 0948   GFRAA 75 12/22/2016 0948       Component Value Date/Time   WBC 4.1 05/29/2022 1032   RBC 4.70 05/29/2022 1032   HGB 14.1 05/29/2022 1032   HGB 14.1 12/22/2016 0948   HCT 44.2 05/29/2022 1032   HCT 44.0 12/22/2016 0948   PLT 196.0 05/29/2022 1032   PLT 206 12/22/2016 0948   MCV 94.0 05/29/2022 1032   MCV 94 12/22/2016 0948   MCH 30.3 12/22/2016 0948   MCH 30.7 11/01/2015 1154   MCHC 32.0 05/29/2022 1032   RDW 13.8 05/29/2022 1032   RDW 14.3 12/22/2016 0948   LYMPHSABS 1.3 05/29/2022 1032   LYMPHSABS 0.9 12/22/2016 0948   MONOABS 0.4 05/29/2022 1032   EOSABS 0.2 05/29/2022 1032   EOSABS 0.1 12/22/2016 0948   BASOSABS 0.0 05/29/2022 1032   BASOSABS 0.0 12/22/2016 0948    No results found for: "POCLITH", "LITHIUM"   No results found for: "PHENYTOIN", "PHENOBARB", "VALPROATE", "CBMZ"   .res Assessment: Plan:    Plan:  PDMP reviewed  Xanax 0.5mg  TID - using at bedtime some nights Lamictal  daily  Add Seroquel  at bedtime - start with  for the first 3 times    D/C Clonidine 0.1mg  at hs for sleep D/C Cymbalta  daily    May require time away from work for mood stabilization.   RTC 4 weeks  Patient advised to contact office with any questions, adverse effects, or acute worsening in signs and symptoms.  Discussed potential benefits, risk, and side effects of benzodiazepines to include potential risk of tolerance and dependence, as well as possible drowsiness.  Advised patient not to drive if experiencing drowsiness and to take lowest possible effective dose to minimize risk of dependence and tolerance. Diagnoses and all orders for this visit:  Moderate recurrent major depression  Insomnia, unspecified type -     QUEtiapine (SEROQUEL) 50 MG tablet; Take 1 tablet (50 mg total) by mouth at bedtime.  Generalized anxiety disorder  PTSD (post-traumatic stress disorder)  Panic disorder     Please  see After Visit Summary for patient specific instructions.  Future Appointments  Date Time Provider Department Center  11/05/2022  9:00 AM Mathis Fare, LCSW [facility] None  06/02/2023  9:00 AM Kuneff, Renee A, DO [id number]    No orders of the defined types were placed in this encounter.   -------------------------------

## 2022-10-24 NOTE — Progress Notes (Signed)
[facility] Counselor/Therapist Progress Note  Patient ID: [name], MRN: [id number],    Date: 10/24/2022  Time Spent: 55 minutes  Treatment Type: Individual Therapy  Reported Symptoms: depression, anxiety, some obsessive thinking, anger "but I can put on a good act", irritability  Mental Status Exam:  Appearance:   Casual     Behavior:  Appropriate, Sharing, and Motivated  Motor:  Normal  Speech/Language:   Clear and Coherent  Affect:  Depressed and angry  Mood:  angry, anxious, depressed, and irritable  Thought process:  goal directed  Thought content:    Obsessive thoughts  Sensory/Perceptual disturbances:    WNL  Orientation:  oriented to person, place, time/date, situation, day of week, month of year, year, and stated date of October 24, 2022  Attention:  Good  Concentration:  Fair  Memory:  WNL  Fund of knowledge:   Good  Insight:    Good  Judgment:   Good  Impulse Control:  Good   Risk Assessment: Danger to Self:  No Self-injurious Behavior: No Danger to Others: No Duty to Warn:no Physical Aggression / Violence:No  Access to Firearms a concern: No  Gang Involvement:No   Subjective:  Patient in session today reporting depression, anxiety, anger, irritability, and obsessive thinking" but I can put on a good act when I need to."  Visit to see her mom in [place] "was not a good experience." Issues between her and mother are challenging. Lots of anger towards mother which patient processed today, long term anger, lots of tearfulness. Has felt she couldn't say "no" and working to be able to say what she needs to say at times rather than holding her feelings in.  Practiced with client ways of being able to talk to mother and some people within her husband's family in ways that identify her feelings better and might help them understand her better, and also possibly improve their relationship.  Difficult for patient to imagine that happening.   Still encouraged her to look at some of her choices that she can make and some changes in communication especially with extended family.  Encouraged patient and her being outside more and involved in things that she enjoys including bike riding and being with her dogs.  States that she has been getting back into her bike riding routine after her illness.  Interventions: Cognitive Behavioral Therapy and Ego-Supportive  Long term goal: Develop healthy cognitive patterns and beliefs about self and the world that lead to alleviation of depression and anxiety and help prevent relapse.  Progressing: Patient is motivated. Struggling to focus some due to immediate, unexpected health concern with husband.  Short term goal: Learn and implement personal skills for managing stress, solving daily problems, and resolving conflicts effectively. Strategy: Teach patient calming skills, problem solving skills, and conflict resolution skills, to better manage daily stressors  Diagnosis:   ICD-10-CM   1. Moderate recurrent major depression  F33.1      Plan:  Patient actively participating in session today working on her depression, anxiety, irritability, and anger mostly related to relationships with her mother, husband's mother and family, and some within her marriage.  Has made some progress and needs to continue working with goal-directed behaviors in order to move further and in a positive and more hopeful direction. Encouraged patient in her practice of more positive and self affirming behaviors as noted in session including: Remaining on medications as prescribed, allowing for better sleep patterns, exercising on  her bike to help deal with stress, taking breaks as she needs throughout the day to experience more calmness, deep breathing exercises, staying in touch with supportive people, working on letting go of negativity and over worrying in order to see more positive options for herself, healthy boundaries  with others, and realize the strength she shows when working with goal-directed behaviors to move in a direction that supports her improved emotional health and outlook.  Goal review and progress/challenges noted with patient.  Next appointment within 2 to 3 weeks.  This record has been created using [employer].  Chart creation errors have been sought, but may not always have been located and corrected.  Such creation errors do not reflect on the standard of medical care provided.   Mathis Fare, LCSW

## 2022-10-26 ENCOUNTER — Encounter: Payer: Self-pay | Admitting: Family Medicine

## 2022-11-05 ENCOUNTER — Ambulatory Visit: Payer: Commercial Managed Care - PPO | Admitting: Sports Medicine

## 2022-11-05 ENCOUNTER — Ambulatory Visit (INDEPENDENT_AMBULATORY_CARE_PROVIDER_SITE_OTHER): Payer: Commercial Managed Care - PPO | Admitting: Psychiatry

## 2022-11-05 DIAGNOSIS — F411 Generalized anxiety disorder: Secondary | ICD-10-CM

## 2022-11-05 NOTE — Progress Notes (Signed)
[facility] Counselor/Therapist Progress Note  Patient ID: [name], MRN: [id number],    Date: 11/05/2022  Time Spent: 55 minutes   Treatment Type: Individual Therapy  Reported Symptoms: anxiety, depression "better but underlying", sleeping some better  Mental Status Exam:  Appearance:   Casual     Behavior:  Appropriate, Sharing, and Motivated  Motor:  Normal  Speech/Language:   Clear and Coherent  Affect:  Anxious, some depression  Mood:  anxious and some depression  Thought process:  goal directed  Thought content:    Some obsessive thoughts and ruminating  Sensory/Perceptual disturbances:    WNL  Orientation:  oriented to person, place, time/date, situation, day of week, month of year, year, and stated date of November 05, 2022  Attention:  Good  Concentration:  Good  Memory:  WNL  Fund of knowledge:   Good  Insight:    Good  Judgment:   Good  Impulse Control:  Good   Risk Assessment: Danger to Self:  No Self-injurious Behavior: No Danger to Others: No Duty to Warn:no Physical Aggression / Violence:No  Access to Firearms a concern: No  Gang Involvement:No   Subjective:  Patient in today reporting some improvement in her depression and sleeping some better which has helped make mood better. Still some underlying anger and irritability. Anxiety is stronger symptom currently and she is currently working with strategies to manage it more effectively. Obsessive thinking decreased. Tearfulness not present. Shares some changes she and husband are making which is improving their relationship gradually. Getting closer to and having more contact with sister living in [place], [place] and this growth in their relationship is helpful to patient. Relationship with husband definitely improving and becoming healthier. Working more on limit-setting with mother as needed. Less anger. Still getting outside more and that is helpful to patient. One of her dogs died past week and  patient has been "dealing" with that loss but seems to be healthy in her feelings and outlook including appropriate grieving. Encouraged patient in her being outdoors more with improving weather, as being outside is definitely a positive for her.  Interventions: Cognitive Behavioral Therapy and Ego-Supportive  Long term goal: Develop healthy cognitive patterns and beliefs about self and the world that lead to alleviation of depression and anxiety and help prevent relapse.  Progressing: Patient is motivated. Struggling to focus some due to immediate, unexpected health concern with husband.  Short term goal: Learn and implement personal skills for managing stress, solving daily problems, and resolving conflicts effectively. Strategy: Teach patient calming skills, problem solving skills, and conflict resolution skills, to better manage daily stressors  Diagnosis:   ICD-10-CM   1. Generalized anxiety disorder  F41.1      Plan:  Patient showing good participation in session today working further on her anxiety and depression as noted above.  Good follow through on strategies and is making progress. Needs to continue working with goal-directed behaviors to keep moving in a forward direction. Improvements being noted some in her marriage also.Encouraged patient in practicing more positive and self affirming behaviors as noted in session including: Allowing for better sleep patterns, staying on her medications as prescribed, exercising on her bike to help deal with stress, taking breaks as needed throughout the day to experience more calmness, deep breathing exercises, staying in touch with supportive people, working on letting go of negativity and over worrying in order to see more positive options for herself, healthy boundaries with others, and recognize  the strengths she shows when working with goal-directed behaviors to move in a direction that supports her improved emotional health and overall  wellbeing.  Goal review and progress/challenges noted with patient.  Next appointment within 2 to 3 weeks.  This record has been created using [employer].  Chart creation errors have been sought, but may not always have been located and corrected.  Such creation errors do not reflect on the standard of medical care provided.   Mathis Fare, LCSW

## 2022-11-11 ENCOUNTER — Telehealth: Payer: Self-pay

## 2022-11-11 NOTE — Telephone Encounter (Signed)
Nov 17 1128 or 350 appt  She can do OTC omeprazole daily also

## 2022-11-11 NOTE — Telephone Encounter (Signed)
Left her a detailed message with Dr Marvell Fuller advice. Hopefully we will catch up tomorrow and put her on the books.

## 2022-11-11 NOTE — Telephone Encounter (Signed)
Patient called in and reports abdominal pain to the right and left of her belly button. Onset x 2 weeks. A little nausea after eating, no fever, no diarrhea, constipation. No injury. Please advise Sir, thanks.

## 2022-11-12 NOTE — Telephone Encounter (Signed)
[name] called and we put her on the books for May 7th at 11:30.

## 2022-11-18 ENCOUNTER — Ambulatory Visit (INDEPENDENT_AMBULATORY_CARE_PROVIDER_SITE_OTHER): Payer: Commercial Managed Care - PPO | Admitting: Internal Medicine

## 2022-11-18 ENCOUNTER — Encounter: Payer: Self-pay | Admitting: Internal Medicine

## 2022-11-18 ENCOUNTER — Ambulatory Visit (INDEPENDENT_AMBULATORY_CARE_PROVIDER_SITE_OTHER)
Admission: RE | Admit: 2022-11-18 | Discharge: 2022-11-18 | Disposition: A | Discharge status: Home / Self Care | Payer: Commercial Managed Care - PPO | Source: Ambulatory Visit | Attending: Internal Medicine | Admitting: Internal Medicine

## 2022-11-18 VITALS — BP 120/66 | HR 56 | Ht 62.0 in | Wt 113.2 lb

## 2022-11-18 DIAGNOSIS — K5900 Constipation, unspecified: Secondary | ICD-10-CM

## 2022-11-18 DIAGNOSIS — R1031 Right lower quadrant pain: Secondary | ICD-10-CM

## 2022-11-18 DIAGNOSIS — R1032 Left lower quadrant pain: Secondary | ICD-10-CM | POA: Diagnosis not present

## 2022-11-18 NOTE — Patient Instructions (Signed)
Please go to basement for an xray - once I review it I will call you. Could be tomorrow.  I appreciate the opportunity to care for you. Iva Boop, MD, [facility]

## 2022-11-18 NOTE — Progress Notes (Signed)
[name] 67 y.o. 11-Oct-1955 [id number]  Assessment & Plan:   Encounter Diagnoses  Name Primary?   Constipation, unspecified constipation type Yes   Abdominal pain, bilateral lower quadrant     Orders Placed This Encounter  Procedures   DG Abd 2 Views   Abdominal film reading not out yet, seems to be relatively nonspecific bowel gas pattern there is a fair amount of stool in the lower colon I think.  Concentrated in the pelvis.  I think a MiraLAX purge would be in order.  Could consider colonoscopy before routine screening date of 03/03/2023.    Subjective:   Chief Complaint: Constipation and abdominal pain  HPI Ceci has had about a months worth of intermittent left and right lower quadrant abdominal pain.  This is associated with some constipation.  Moving her bowels regularly using some MiraLAX as needed.  Bowel movements will be mushy or hard and it is difficult to expel them particularly when mushy.  She does not have any genitourinary issues.  She is taking Benefiber daily.  There is a history of birth trauma with her first son.  No rectal bleeding no diet changes no medication changes.  Her weight is stable.  Last colonoscopy 2014 was normal, August of that year.    Wt Readings from Last 3 Encounters:  11/18/22 113 lb 3.2 oz (51.3 kg)  10/23/22 112 lb 12.8 oz (51.2 kg)  09/08/22 113 lb 4 oz (51.4 kg)     Allergies  Allergen Reactions   Aspartame And Phenylalanine Other (See Comments)    Migraines   Beef-Derived Products Other (See Comments)    severe cramping   Citrus Other (See Comments)    Causes Interstitial cystitis flares   Penicillins Hives and Rash    Denies airway involvement Has patient had a PCN reaction causing immediate rash, facial/tongue/throat swelling, SOB or lightheadedness with hypotension: yes hives.  Has patient had a PCN reaction causing severe rash involving mucus membranes or skin necrosis:no Has patient had a PCN reaction  that required hospitalization No Has patient had a PCN reaction occurring within the last 10 years: No If all of the above answers are "NO", then may proceed with Cephalosporin use.    Promethazine Hcl Other (See Comments)    Muscle twitching and contracture    Beta Vulgaris Other (See Comments)    beets   Adhesive [Tape] Other (See Comments)    Red splotches   Morphine Sulfate Nausea And Vomiting   Current Meds  Medication Sig   albuterol (VENTOLIN HFA) 108 (90 Base) MCG/ACT inhaler Inhale 2 puffs 15 minutes before exercise and as needed for wheeze.   ALPRAZolam (XANAX) 0.5 MG tablet Take 1 tablet (0.5 mg total) by mouth 3 (three) times daily as needed for anxiety.   atenolol (TENORMIN) 25 MG tablet Take 1 tablet (25 mg total) by mouth daily.   B-D 3CC LUER-LOK SYR 25GX1" 25G X 1" 3 ML MISC USE WITH CYANOCOBALAMIN EVERY 14 DAYS   cholecalciferol (VITAMIN D) 1000 UNITS tablet Take 1,000 Units by mouth every evening.   cyanocobalamin (VITAMIN B12) 1000 MCG/ML injection Inject 1 mL (1,000 mcg total) into the muscle every 14 (fourteen) days.   fluticasone (FLONASE) 50 MCG/ACT nasal spray Place 2 sprays into the nose daily.   hydrocortisone (ANUSOL-HC) 2.5 % rectal cream Insert rectally 2 times daily for 10 days   ketoconazole (NIZORAL) 2 % shampoo Use topically as directed for 30 days   lamoTRIgine (LAMICTAL) 200  MG tablet Take 1 tablet (200 mg total) by mouth at bedtime.   QUEtiapine (SEROQUEL) 50 MG tablet Take 1 tablet (50 mg total) by mouth at bedtime.   SYRINGE-NEEDLE, DISP, 3 ML (B-D 3CC LUER-LOK SYR 25GX1") 25G X 1" 3 ML MISC Use as directed to inject b-12 into the skin   Past Medical History:  Diagnosis Date   Allergy    Anemia    after birth of child 1yrs ago   Anxiety and depression    with insomnia   Autoimmune disease (HCC) 08/06/2020   Blood transfusion without reported diagnosis    x3   Chronic back pain    ? RA, ? Lupus - reports saw rheumatologist in the past but  better now, sees Dr. Dutch Quint   Eczema    Flushing 05/27/2021   GERD 02/13/2007   Qualifier: Diagnosis of  By: Jonny Ruiz MD, Len Blalock    GERD (gastroesophageal reflux disease)    takes Omeprazole daily, with nausea   Greater trochanteric bursitis of left hip 03/25/2019   Injected March 25, 2019   Heart murmur    Hemorrhoids    History of kidney stones    passed on her own   History of migraine    last one a yr ago and takes Zomig prn   Hot flashes 11/10/2014   HTN (hypertension) 03/31/2012   Hx of echocardiogram    Echo (8/15):  EF 55-60%, no RWMA   IBS (irritable bowel syndrome)    constipation predominant   Interstitial cystitis    hx of UTI   Irritable bowel syndrome 12/14/2009   Qualifier: Diagnosis of  By: Leone Payor MD, Charlyne Quale    Memory loss 12/22/2016   Numbness    both legs and related to back   Osteoporosis    osteopenia   Palpitations    on metoprolol in past but made BP too low   Pneumonia    hx of;last time 42yrs ago   PONV (postoperative nausea and vomiting)    laryngospasm-1999   Poor vision 10/15/2015   -? Ocular rosacea -sees opthomology    Raynaud's disease 10/21/2012   Raynaud's disease /phenomenon 10/21/2012   no meds, worse in the winter or cold environment.    Rheumatoid aortitis    uses Diclofenac gel;Rheumatoid   Seasonal allergies    Flonase daily    Strain of left piriformis muscle 05/18/2019   Urinary incontinence    Past Surgical History:  Procedure Laterality Date   ABDOMINAL HYSTERECTOMY  1997   BREAST BIOPSY  1999   benign   COLONOSCOPY  02/11/2013 and 12/24/04   internal and external hemorrhoids   ENDOMETRIAL ABLATION     EXCISION MORTON'S NEUROMA Right    FLEXIBLE SIGMOIDOSCOPY  03/07/2008   internal and external hemorrhoids   POSTERIOR LUMBAR FUSION  05/02/2013   L4-L5 fusion (cage/screws/bone graft) Dr. Jordan Likes   TONSILLECTOMY     UPPER GASTROINTESTINAL ENDOSCOPY  10/23/2010   gastritis, irregular Z-line   wisdom teeth extracted      Social History   Social History Narrative   Spiritual Beliefs: methodist   Marital status/children/pets: In second marriage.  First husband committed suicide.  2 children.   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   Safety:      -Wears a bicycle helmet riding a bike: Yes     -smoke alarm in the home:No     - wears seatbelt: Yes     - Feels safe in their  relationships: Yes            family history includes Alcohol abuse in her daughter, father, paternal grandfather, sister, and son; Arthritis in her paternal grandmother; Colon polyps in her sister; Coronary artery disease in her father; Dementia in her paternal grandmother; Depression in her daughter, father, mother, paternal grandmother, sister, and son; Diabetes in her paternal uncle; Diverticulosis in her mother; Drug abuse in her daughter; Early death in her father, maternal grandfather, and paternal grandfather; Heart attack in her father; Hyperlipidemia in her father, mother, and paternal grandfather; Hypertension in her father, maternal grandfather, maternal grandmother, mother, and paternal grandfather; Kidney disease in her father and mother; Mental illness in her father and paternal grandmother; Osteoarthritis in her mother; Pancreatic cancer in her maternal grandfather; Stroke in her maternal grandmother and paternal grandfather.   Review of Systems As per HPI  Objective:   Physical Exam BP 120/66   Pulse (!) 56   Ht 5\' 2"  (1.575 m)   Wt 113 lb 3.2 oz (51.3 kg)   SpO2 98%   BMI 20.70 kg/m  No acute distress Abdomen is soft and mildly tender in left lower quadrant with deep without organomegaly or mass  Rectal exam performed in the presence of female staff   Anoderm inspection revealed small external hemorrhoids Anal wink was positive Digital exam revealed normal resting tone and voluntary squeeze. No mass present.  No significant stool present, question rectocele mild. Simulated defecation with  valsalva revealed appropriate abdominal contraction and descent.

## 2022-11-19 ENCOUNTER — Other Ambulatory Visit (HOSPITAL_COMMUNITY): Payer: Self-pay

## 2022-11-19 ENCOUNTER — Other Ambulatory Visit: Payer: Self-pay

## 2022-11-25 ENCOUNTER — Ambulatory Visit (INDEPENDENT_AMBULATORY_CARE_PROVIDER_SITE_OTHER): Payer: Commercial Managed Care - PPO | Admitting: Adult Health

## 2022-11-25 ENCOUNTER — Encounter: Payer: Self-pay | Admitting: Adult Health

## 2022-11-25 ENCOUNTER — Other Ambulatory Visit (HOSPITAL_COMMUNITY): Payer: Self-pay

## 2022-11-25 ENCOUNTER — Other Ambulatory Visit: Payer: Self-pay

## 2022-11-25 ENCOUNTER — Ambulatory Visit (INDEPENDENT_AMBULATORY_CARE_PROVIDER_SITE_OTHER): Payer: Commercial Managed Care - PPO | Admitting: Psychiatry

## 2022-11-25 DIAGNOSIS — G4700 Insomnia, unspecified: Secondary | ICD-10-CM | POA: Diagnosis not present

## 2022-11-25 DIAGNOSIS — F4310 Post-traumatic stress disorder, unspecified: Secondary | ICD-10-CM

## 2022-11-25 DIAGNOSIS — F411 Generalized anxiety disorder: Secondary | ICD-10-CM | POA: Diagnosis not present

## 2022-11-25 DIAGNOSIS — F331 Major depressive disorder, recurrent, moderate: Secondary | ICD-10-CM | POA: Diagnosis not present

## 2022-11-25 DIAGNOSIS — F410 Panic disorder [episodic paroxysmal anxiety] without agoraphobia: Secondary | ICD-10-CM | POA: Diagnosis not present

## 2022-11-25 MED ORDER — QUETIAPINE FUMARATE 50 MG PO TABS
50.0000 mg | ORAL_TABLET | Freq: Every day | ORAL | 1 refills | Status: DC
  Filled 2022-11-25: qty 90, 90d supply, fill #0

## 2022-11-25 NOTE — Progress Notes (Signed)
[name] [address] [place] [address] Phone: [phone] Subjective:   [name], am serving as a scribe for Dr. Antoine Primas.  I'm seeing this patient by the request  of:  Kuneff, Renee A, DO  CC: Right and started to have left hand  BJY:[id number]  [name] is a 67 y.o. female coming in with complaint of B thumb pain. PRP given 12/24/2021. Patient states that she feels like she needs surgery on R CMC jt. Waiting until October to the on Medicare prior to getting surgery.   L thumb pain with typing and gripping. Seems to be getting worse.       Past Medical History:  Diagnosis Date   Allergy    Anemia    after birth of child 60yrs ago   Anxiety and depression    with insomnia   Autoimmune disease (HCC) 08/06/2020   Blood transfusion without reported diagnosis    x3   Chronic back pain    ? RA, ? Lupus - reports saw rheumatologist in the past but better now, sees Dr. Dutch Quint   Eczema    Flushing 05/27/2021   GERD 02/13/2007   Qualifier: Diagnosis of  By: Jonny Ruiz MD, Len Blalock    GERD (gastroesophageal reflux disease)    takes Omeprazole daily, with nausea   Greater trochanteric bursitis of left hip 03/25/2019   Injected March 25, 2019   Heart murmur    Hemorrhoids    History of kidney stones    passed on her own   History of migraine    last one a yr ago and takes Zomig prn   Hot flashes 11/10/2014   HTN (hypertension) 03/31/2012   Hx of echocardiogram    Echo (8/15):  EF 55-60%, no RWMA   IBS (irritable bowel syndrome)    constipation predominant   Interstitial cystitis    hx of UTI   Irritable bowel syndrome 12/14/2009   Qualifier: Diagnosis of  By: Leone Payor MD, Charlyne Quale    Memory loss 12/22/2016   Numbness    both legs and related to back   Osteoporosis    osteopenia   Palpitations    on metoprolol in past but made BP too low   Pneumonia    hx of;last time 67yrs ago   PONV (postoperative nausea  and vomiting)    laryngospasm-1999   Poor vision 10/15/2015   -? Ocular rosacea -sees opthomology    Raynaud's disease 10/21/2012   Raynaud's disease /phenomenon 10/21/2012   no meds, worse in the winter or cold environment.    Rheumatoid aortitis    uses Diclofenac gel;Rheumatoid   Seasonal allergies    Flonase daily    Strain of left piriformis muscle 05/18/2019   Urinary incontinence    Past Surgical History:  Procedure Laterality Date   ABDOMINAL HYSTERECTOMY  1997   BREAST BIOPSY  1999   benign   COLONOSCOPY  02/11/2013 and 12/24/04   internal and external hemorrhoids   ENDOMETRIAL ABLATION     EXCISION MORTON'S NEUROMA Right    FLEXIBLE SIGMOIDOSCOPY  03/07/2008   internal and external hemorrhoids   POSTERIOR LUMBAR FUSION  05/02/2013   L4-L5 fusion (cage/screws/bone graft) Dr. Jordan Likes   TONSILLECTOMY     UPPER GASTROINTESTINAL ENDOSCOPY  10/23/2010   gastritis, irregular Z-line   wisdom teeth extracted     Social History   Socioeconomic History   Marital status: Married    Spouse  name: Not on file   Number of children: Not on file   Years of education: Not on file   Highest education level: Not on file  Occupational History   Occupation: Charity fundraiser    Employer: [employer]  Tobacco Use   Smoking status: Never   Smokeless tobacco: Never  Vaping Use   Vaping Use: Never used  Substance and Sexual Activity   Alcohol use: Yes    Alcohol/week: 2.0 standard drinks of alcohol    Types: 1 Shots of liquor, 1 Standard drinks or equivalent per week    Comment: one margarita a week   Drug use: No   Sexual activity: Not Currently    Partners: Male  Other Topics Concern   Not on file  Social History Narrative   Spiritual Beliefs: methodist   Marital status/children/pets: In second marriage.  First husband committed suicide.  2 children.   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   Safety:      -Wears a bicycle helmet riding a bike: Yes     -smoke alarm in the  home:No     - wears seatbelt: Yes     - Feels safe in their relationships: Yes            Social Determinants of Health   Financial Resource Strain: Not on file  Food Insecurity: Not on file  Transportation Needs: Not on file  Physical Activity: Not on file  Stress: Not on file  Social Connections: Not on file   Allergies  Allergen Reactions   Aspartame And Phenylalanine Other (See Comments)    Migraines   Beef-Derived Products Other (See Comments)    severe cramping   Citrus Other (See Comments)    Causes Interstitial cystitis flares   Penicillins Hives and Rash    Denies airway involvement Has patient had a PCN reaction causing immediate rash, facial/tongue/throat swelling, SOB or lightheadedness with hypotension: yes hives.  Has patient had a PCN reaction causing severe rash involving mucus membranes or skin necrosis:no Has patient had a PCN reaction that required hospitalization No Has patient had a PCN reaction occurring within the last 10 years: No If all of the above answers are "NO", then may proceed with Cephalosporin use.    Promethazine Hcl Other (See Comments)    Muscle twitching and contracture    Beta Vulgaris Other (See Comments)    beets   Adhesive [Tape] Other (See Comments)    Red splotches   Morphine Sulfate Nausea And Vomiting   Family History  Problem Relation Age of Onset   Diverticulosis Mother    Hypertension Mother    Osteoarthritis Mother    Depression Mother    Hyperlipidemia Mother    Kidney disease Mother    Coronary artery disease Father    Heart attack Father    Hypertension Father    Depression Father    Hyperlipidemia Father    Alcohol abuse Father    Early death Father    Kidney disease Father    Mental illness Father    Alcohol abuse Sister    Depression Sister    Colon polyps Sister    Hypertension Maternal Grandmother    Stroke Maternal Grandmother    Hypertension Maternal Grandfather    Pancreatic cancer Maternal  Grandfather    Early death Maternal Grandfather    Depression Paternal Grandmother    Dementia Paternal Grandmother    Arthritis Paternal Grandmother    Mental illness Paternal Grandmother  Stroke Paternal Grandfather    Hyperlipidemia Paternal Grandfather    Alcohol abuse Paternal Grandfather    Early death Paternal Grandfather    Hypertension Paternal Grandfather    Alcohol abuse Daughter    Depression Daughter    Drug abuse Daughter    Alcohol abuse Son    Depression Son    Diabetes Paternal Uncle    Colon cancer Neg Hx      Current Outpatient Medications (Cardiovascular):    amLODipine (NORVASC) 2.5 MG tablet, Take 1 tablet (2.5 mg total) by mouth daily.   atenolol (TENORMIN) 25 MG tablet, Take 1 tablet (25 mg total) by mouth daily.   cloNIDine (CATAPRES) 0.1 MG tablet, Take 1 tablet (0.1 mg total) by mouth at bedtime.  Current Outpatient Medications (Respiratory):    albuterol (VENTOLIN HFA) 108 (90 Base) MCG/ACT inhaler, Inhale 2 puffs 15 minutes before exercise and as needed for wheeze.   fluticasone (FLONASE) 50 MCG/ACT nasal spray, Place 2 sprays into the nose daily.  Current Outpatient Medications (Analgesics):    meloxicam (MOBIC) 15 MG tablet, Take 1 tablet (15 mg total) by mouth daily.  Current Outpatient Medications (Hematological):    cyanocobalamin (VITAMIN B12) 1000 MCG/ML injection, Inject 1 mL (1,000 mcg total) into the muscle every 14 (fourteen) days.  Current Outpatient Medications (Other):    ALPRAZolam (XANAX) 0.5 MG tablet, Take 1 tablet (0.5 mg total) by mouth 3 (three) times daily as needed for anxiety.   B-D 3CC LUER-LOK SYR 25GX1" 25G X 1" 3 ML MISC, USE WITH CYANOCOBALAMIN EVERY 14 DAYS   cholecalciferol (VITAMIN D) 1000 UNITS tablet, Take 1,000 Units by mouth every evening.   DULoxetine (CYMBALTA) 30 MG capsule, Take 1 capsule (30 mg total) by mouth daily.   hydrocortisone (ANUSOL-HC) 2.5 % rectal cream, Insert rectally 2 times daily for 10  days   ketoconazole (NIZORAL) 2 % shampoo, Use topically as directed for 30 days   lamoTRIgine (LAMICTAL) 200 MG tablet, Take 1 tablet (200 mg total) by mouth at bedtime.   meclizine (ANTIVERT) 25 MG tablet, Take 1 tablet (25 mg total) by mouth 3 (three) times daily as needed for dizziness.   ondansetron (ZOFRAN) 4 MG tablet, Take 1 tablet (4 mg total) by mouth every 8 (eight) hours as needed for nausea or vomiting.   QUEtiapine (SEROQUEL) 50 MG tablet, Take 1 tablet (50 mg total) by mouth at bedtime.   SYRINGE-NEEDLE, DISP, 3 ML (B-D 3CC LUER-LOK SYR 25GX1") 25G X 1" 3 ML MISC, Use as directed to inject b-12 into the skin   valACYclovir (VALTREX) 500 MG tablet, Take 1 tablet (500 mg total) by mouth 2 (two) times daily (12 hours apart) for 1 week   Reviewed prior external information including notes and imaging from  primary care provider As well as notes that were available from care everywhere and other healthcare systems.  Past medical history, social, surgical and family history all reviewed in electronic medical record.  No pertanent information unless stated regarding to the chief complaint.   Review of Systems:  No headache, visual changes, nausea, vomiting, diarrhea, constipation, dizziness, abdominal pain, skin rash, fevers, chills, night sweats, weight loss, swollen lymph nodes, body aches, joint swelling, chest pain, shortness of breath, mood changes. POSITIVE muscle aches  Objective  Blood pressure 120/82, pulse (!) 50, height 5\' 2"  (1.575 m), weight 113 lb (51.3 kg), SpO2 98 %.   General: No apparent distress alert and oriented x3 mood and affect normal, dressed appropriately.  HEENT: Pupils equal, extraocular movements intact  Respiratory: Patient's speak in full sentences and does not appear short of breath  Cardiovascular: No lower extremity edema, non tender, no erythema  Bilateral thumbs do have significant arthritic changes noted.  Positive grind test noted on the left  side.  Patient does have some mild weakness noted as well.   Limited muscular skeletal ultrasound was performed and interpreted by Antoine Primas, M  Limited ultrasound shows the patient does have significant narrowing with near bone-on-bone osteoarthritic changes of the [facility] joint noted with hypoechoic changes consistent with effusion as well. Impression: Severe thumb arthritis   Impression and Recommendations:    The above documentation has been reviewed and is accurate and complete Judi Saa, DO

## 2022-11-25 NOTE — Progress Notes (Signed)
[facility] Counselor/Therapist Progress Note  Patient ID: [name], MRN: [id number],    Date: 11/25/2022  Time Spent: 55 minutes     (2:20pm-3:15pm)  Treatment Type: Individual Therapy  Reported Symptoms: depression ("but improving"), anxiety, anger (husband and mother-in-law), low frustration tolerance  Mental Status Exam:  Appearance:   Casual and Neat     Behavior:  Appropriate, Sharing, and Motivated  Motor:  Normal  Speech/Language:   Clear and Coherent  Affect:  Depression, anxiety, anger, low frustration tolerance  Mood:  angry, anxious, and depressed  Thought process:  goal directed  Thought content:    Some obsessive thoughts  Sensory/Perceptual disturbances:    WNL  Orientation:  oriented to person, place, time/date, situation, day of week, month of year, year, and stated date of Nov 25, 2022  Attention:  Good  Concentration:  Good  Memory:  WNL  Fund of knowledge:   Good  Insight:    Good  Judgment:   Good  Impulse Control:  Good   Risk Assessment: Danger to Self:  No Self-injurious Behavior: No Danger to Others: No Duty to Warn:no Physical Aggression / Violence:No  Access to Firearms a concern: No  Gang Involvement:No   Subjective:   Patient in today reporting depression, anxiety, anger, and low frustration tolerance, and depression has decreased some. Changes in meds are helping and sleep is better. Lots of stressors with mother-in-law and worked with patient in looking at what is in her control and what is not in her control. Has tended to give lots of power to mother-in-law and others within the family who have stressful relationships with patient. Worked today on communication and behavioral strategies to help patient better react in stressful/tense and often uncertain situations. Practiced in session some of the communication and attitudinal changes that could be very helpful to patient and she agreed. Obsessive thinking decreased. Increased  ability to calm herself when stressed as this is a work in progress, and did well in practicing some in session today. Starting to feel a little more hopeful for changes she has previously felt were not possible, individually and in her marriage.  Motivation improved more.  Some marital challenges and progress.  Did encourage patient to participate in more activities that she really enjoys especially in outdoor activities and being with her dogs.  Interventions: Cognitive Behavioral Therapy and Ego-Supportive  Long term goal: Develop healthy cognitive patterns and beliefs about self and the world that lead to alleviation of depression and anxiety and help prevent relapse.  Progressing: Patient is motivated. Struggling to focus some due to immediate, unexpected health concern with husband.  Short term goal: Learn and implement personal skills for managing stress, solving daily problems, and resolving conflicts effectively. 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  Worked really hard in session today and also seemed to gain a good bit from it, agreeing to some homework to follow-up with between sessions.  Is making progress and needs to continue with goal-directed behaviors to keep moving in a forward direction. Encouraged patient in her practice of more positive and self affirming behaviors as noted in session including: Allowing for better sleep  patterns each night, staying on top of her medications as prescribed, taking breaks as needed throughout the day to experience more calmness, exercising on her bike to help deal with stress, deep breathing exercises, staying in  touch with supportive people, work on letting go of negativity and over worrying in order to see more positive options for herself, healthy boundaries with others, and realize the strength she shows when working with goal-directed behaviors to move in a direction that supports her improved emotional health and outlook.  1-10 depression scale-4/5 1-10 anxiety scale-6 1-10 self-esteem scale- 4 1-10 motivation scale-5/6 1-10 hopefulness scale-4/5  Goal review and progress/challenges noted with patient.  Next appointment within 3 weeks.   Mathis Fare, LCSW

## 2022-11-25 NOTE — Progress Notes (Signed)
[name] [id number] 11/03/1955 67 y.o.  Subjective:   Patient ID:  [name] is a 67 y.o. (DOB May 05, 1956) female.  Chief Complaint: No chief complaint on file.   HPI [name] presents to the office today for follow-up of MDD, GAD, panic disorder, insomnia and PTSD.  Describes mood today as "a little better". Pleasant. Reports tearfulness. Mood symptoms - reports decreased anxiety, depression, and irritability. Denies panic attacks. Reports decreased worry, rumination, and over thinking. Mood is more consistent - "still getting annoyed". Stating "I can shut my mind off and get some rest". Feels like the addition of Seroquel has been helpful. Varying interest and motivation. Seeing therapist - Rockne Menghini. Taking medications as prescribed.  Energy levels stable. Active, has a regular exercise routine - biking. Enjoys some usual interests and activities. Married. Lives with husband of 16 years and 5 dogs. Mother lives in [place]. Spending time with family. Appetite adequate. Weight stable - 112 pounds. Sleeps has improved. Averages 6 to 7 hours with Seroquel 25mg  at bedtime. Focus and concentration improved. Completing tasks. Managing aspects of household. 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  Denies SI or HI.  Denies AH or VH. Denies self harm. Denies substance use.  Previous medication trials: Remeron, Trazadone, Restoril, Xanax, Seroquel, Belsomra   GAD-7    Flowsheet Row Office Visit from 05/29/2022 in [facility] Visit from 01/01/2022 in [facility] Office Visit from 04/27/2019 in [facility] Office Visit from 12/29/2018 in [facility]  Total GAD-7 Score 11 7 11 18       Mini-Mental    Flowsheet Row Office Visit from 12/22/2016 in [facility]  Total Score (max 30 points ) 30       PHQ2-9    Flowsheet Row Office Visit from 09/08/2022 in [facility] Visit from 05/29/2022 in [facility] [place] Office Visit from 01/01/2022 in [facility] [place] [facility] Visit from 05/27/2021 in [facility] [place] Office Visit from 03/29/2021 in [facility] Total Score 1 0 0 0 0  PHQ-9 Total Score 6 3 1  -- 0        Review of Systems:  Review of Systems  Musculoskeletal:  Negative for gait problem.  Neurological:  Negative for tremors.  Psychiatric/Behavioral:         Please refer to HPI    Medications: I have reviewed the patient's current medications.  Current Outpatient Medications  Medication Sig Dispense Refill   albuterol (VENTOLIN HFA) 108 (90 Base) MCG/ACT inhaler Inhale 2 puffs 15 minutes before exercise and as needed for wheeze. 6.7 g 5   ALPRAZolam (XANAX) 0.5 MG tablet Take 1 tablet (0.5 mg total) by mouth 3 (three) times daily as needed for anxiety. 90 tablet 2   amLODipine (NORVASC) 2.5 MG tablet Take 1 tablet (2.5 mg total) by mouth daily. (Patient not taking: Reported on 11/18/2022) 90 tablet 1   atenolol (TENORMIN) 25 MG tablet Take 1 tablet (25 mg total) by mouth daily. 90 tablet 3   B-D 3CC LUER-LOK SYR 25GX1" 25G X 1" 3 ML MISC USE WITH CYANOCOBALAMIN EVERY 14 DAYS     cholecalciferol (VITAMIN D) 1000 UNITS tablet Take 1,000 Units by  mouth every evening.     cloNIDine (CATAPRES) 0.1 MG tablet Take 1 tablet (0.1 mg total) by mouth at bedtime. (Patient not taking: Reported on 11/18/2022) 30 tablet 2   cyanocobalamin (VITAMIN B12) 1000 MCG/ML injection Inject 1 mL (1,000 mcg total) into the muscle every 14 (fourteen) days. 6 mL 3   DULoxetine (CYMBALTA) 30 MG capsule Take 1 capsule (30 mg total) by mouth daily. (Patient not taking: Reported on 11/18/2022) 30 capsule 2   fluticasone (FLONASE) 50 MCG/ACT  nasal spray Place 2 sprays into the nose daily. 16 g 5   hydrocortisone (ANUSOL-HC) 2.5 % rectal cream Insert rectally 2 times daily for 10 days 30 g 1   ketoconazole (NIZORAL) 2 % shampoo Use topically as directed for 30 days 120 mL 5   lamoTRIgine (LAMICTAL) 200 MG tablet Take 1 tablet (200 mg total) by mouth at bedtime. 90 tablet 3   meclizine (ANTIVERT) 25 MG tablet Take 1 tablet (25 mg total) by mouth 3 (three) times daily as needed for dizziness. (Patient not taking: Reported on 11/18/2022) 30 tablet 0   ondansetron (ZOFRAN) 4 MG tablet Take 1 tablet (4 mg total) by mouth every 8 (eight) hours as needed for nausea or vomiting. (Patient not taking: Reported on 11/18/2022) 20 tablet 0   QUEtiapine (SEROQUEL) 50 MG tablet Take 1 tablet (50 mg total) by mouth at bedtime. 90 tablet 1   SYRINGE-NEEDLE, DISP, 3 ML (B-D 3CC LUER-LOK SYR 25GX1") 25G X 1" 3 ML MISC Use as directed to inject b-12 into the skin 6 each 3   valACYclovir (VALTREX) 500 MG tablet Take 1 tablet (500 mg total) by mouth 2 (two) times daily (12 hours apart) for 1 week (Patient not taking: Reported on 11/18/2022) 14 tablet 0   No current facility-administered medications for this visit.    Medication Side Effects: None  Allergies:  Allergies  Allergen Reactions   Aspartame And Phenylalanine Other (See Comments)    Migraines   Beef-Derived Products Other (See Comments)    severe cramping   Citrus Other (See Comments)    Causes Interstitial cystitis flares   Penicillins Hives and Rash    Denies airway involvement Has patient had a PCN reaction causing immediate rash, facial/tongue/throat swelling, SOB or lightheadedness with hypotension: yes hives.  Has patient had a PCN reaction causing severe rash involving mucus membranes or skin necrosis:no Has patient had a PCN reaction that required hospitalization No Has patient had a PCN reaction occurring within the last 10 years: No If all of the above answers are "NO", then may  proceed with Cephalosporin use.    Promethazine Hcl Other (See Comments)    Muscle twitching and contracture    Beta Vulgaris Other (See Comments)    beets   Adhesive [Tape] Other (See Comments)    Red splotches   Morphine Sulfate Nausea And Vomiting    Past Medical History:  Diagnosis Date   Allergy    Anemia    after birth of child 6yrs ago   Anxiety and depression    with insomnia   Autoimmune disease (HCC) 08/06/2020   Blood transfusion without reported diagnosis    x3   Chronic back pain    ? RA, ? Lupus - reports saw rheumatologist in the past but better now, sees Dr. Dutch Quint   Eczema    Flushing 05/27/2021   GERD 02/13/2007   Qualifier: Diagnosis of  By: Jonny Ruiz MD, Len Blalock    GERD (gastroesophageal reflux  disease)    takes Omeprazole daily, with nausea   Greater trochanteric bursitis of left hip 03/25/2019   Injected March 25, 2019   Heart murmur    Hemorrhoids    History of kidney stones    passed on her own   History of migraine    last one a yr ago and takes Zomig prn   Hot flashes 11/10/2014   HTN (hypertension) 03/31/2012   Hx of echocardiogram    Echo (8/15):  EF 55-60%, no RWMA   IBS (irritable bowel syndrome)    constipation predominant   Interstitial cystitis    hx of UTI   Irritable bowel syndrome 12/14/2009   Qualifier: Diagnosis of  By: Leone Payor MD, Charlyne Quale    Memory loss 12/22/2016   Numbness    both legs and related to back   Osteoporosis    osteopenia   Palpitations    on metoprolol in past but made BP too low   Pneumonia    hx of;last time 59yrs ago   PONV (postoperative nausea and vomiting)    laryngospasm-1999   Poor vision 10/15/2015   -? Ocular rosacea -sees opthomology    Raynaud's disease 10/21/2012   Raynaud's disease /phenomenon 10/21/2012   no meds, worse in the winter or cold environment.    Rheumatoid aortitis    uses Diclofenac gel;Rheumatoid   Seasonal allergies    Flonase daily    Strain of left piriformis  muscle 05/18/2019   Urinary incontinence     Past Medical History, Surgical history, Social history, and Family history were reviewed and updated as appropriate.   Please see review of systems for further details on the patient's review from today.   Objective:   Physical Exam:  There were no vitals taken for this visit.  Physical Exam Constitutional:      General: She is not in acute distress. Musculoskeletal:        General: No deformity.  Neurological:     Mental Status: She is alert and oriented to person, place, and time.     Coordination: Coordination normal.  Psychiatric:        Attention and Perception: Attention and perception normal. She does not perceive auditory or visual hallucinations.        Mood and Affect: Mood normal. Mood is not anxious or depressed. Affect is not labile, blunt, angry or inappropriate.        Speech: Speech normal.        Behavior: Behavior normal.        Thought Content: Thought content normal. Thought content is not paranoid or delusional. Thought content does not include homicidal or suicidal ideation. Thought content does not include homicidal or suicidal plan.        Cognition and Memory: Cognition and memory normal.        Judgment: Judgment normal.     Comments: Insight intact     Lab Review:     Component Value Date/Time   NA 141 05/29/2022 1032   NA 143 12/22/2016 0948   K 4.8 05/29/2022 1032   CL 105 05/29/2022 1032   CO2 31 05/29/2022 1032   GLUCOSE 85 05/29/2022 1032   GLUCOSE 95 04/21/2006 1635   BUN 19 05/29/2022 1032   BUN 17 12/22/2016 0948   CREATININE 1.01 05/29/2022 1032   CALCIUM 9.5 05/29/2022 1032   PROT 6.5 05/29/2022 1032   PROT 7.0 12/22/2016 0948   ALBUMIN 4.5 05/29/2022 1032   ALBUMIN  4.7 12/22/2016 0948   AST 23 05/29/2022 1032   ALT 16 05/29/2022 1032   ALKPHOS 84 05/29/2022 1032   BILITOT 0.5 05/29/2022 1032   BILITOT 0.3 12/22/2016 0948   GFRNONAA 65 12/22/2016 0948   GFRAA 75 12/22/2016 0948        Component Value Date/Time   WBC 4.1 05/29/2022 1032   RBC 4.70 05/29/2022 1032   HGB 14.1 05/29/2022 1032   HGB 14.1 12/22/2016 0948   HCT 44.2 05/29/2022 1032   HCT 44.0 12/22/2016 0948   PLT 196.0 05/29/2022 1032   PLT 206 12/22/2016 0948   MCV 94.0 05/29/2022 1032   MCV 94 12/22/2016 0948   MCH 30.3 12/22/2016 0948   MCH 30.7 11/01/2015 1154   MCHC 32.0 05/29/2022 1032   RDW 13.8 05/29/2022 1032   RDW 14.3 12/22/2016 0948   LYMPHSABS 1.3 05/29/2022 1032   LYMPHSABS 0.9 12/22/2016 0948   MONOABS 0.4 05/29/2022 1032   EOSABS 0.2 05/29/2022 1032   EOSABS 0.1 12/22/2016 0948   BASOSABS 0.0 05/29/2022 1032   BASOSABS 0.0 12/22/2016 0948    No results found for: "POCLITH", "LITHIUM"   No results found for: "PHENYTOIN", "PHENOBARB", "VALPROATE", "CBMZ"   .res Assessment: Plan:    Plan:  PDMP reviewed  Xanax 0.5mg  TID - decreased use Lamictal 200mg  daily Seroquel 50mg  at bedtime     RTC 3 months  Patient advised to contact office with any questions, adverse effects, or acute worsening in signs and symptoms.  Discussed potential benefits, risk, and side effects of benzodiazepines to include potential risk of tolerance and dependence, as well as possible drowsiness.  Advised patient not to drive if experiencing drowsiness and to take lowest possible effective dose to minimize risk of dependence and tolerance.  Diagnoses and all orders for this visit:  Moderate recurrent major depression (HCC)  Insomnia, unspecified type -     QUEtiapine (SEROQUEL) 50 MG tablet; Take 1 tablet (50 mg total) by mouth at bedtime.  Generalized anxiety disorder  PTSD (post-traumatic stress disorder)  Panic disorder     Please see After Visit Summary for patient specific instructions.  Future Appointments  Date Time Provider Department Center  11/25/2022  3:00 PM Mathis Fare, LCSW [facility] None  12/02/2022 10:45 AM Judi Saa, DO [facility] None  06/02/2023  9:00 AM  Kuneff, Renee A, DO [facility]    No orders of the defined types were placed in this encounter.   -------------------------------

## 2022-12-02 ENCOUNTER — Other Ambulatory Visit (HOSPITAL_COMMUNITY): Payer: Self-pay

## 2022-12-02 ENCOUNTER — Ambulatory Visit: Payer: Commercial Managed Care - PPO | Admitting: Family Medicine

## 2022-12-02 ENCOUNTER — Encounter: Payer: Self-pay | Admitting: Family Medicine

## 2022-12-02 ENCOUNTER — Other Ambulatory Visit: Payer: Self-pay

## 2022-12-02 VITALS — BP 120/82 | HR 50 | Ht 62.0 in | Wt 113.0 lb

## 2022-12-02 DIAGNOSIS — M79645 Pain in left finger(s): Secondary | ICD-10-CM | POA: Diagnosis not present

## 2022-12-02 DIAGNOSIS — M1811 Unilateral primary osteoarthritis of first carpometacarpal joint, right hand: Secondary | ICD-10-CM

## 2022-12-02 DIAGNOSIS — M79644 Pain in right finger(s): Secondary | ICD-10-CM

## 2022-12-02 MED ORDER — MELOXICAM 15 MG PO TABS
15.0000 mg | ORAL_TABLET | Freq: Every day | ORAL | 0 refills | Status: DC
  Filled 2022-12-02 (×4): qty 90, 90d supply, fill #0

## 2022-12-02 NOTE — Patient Instructions (Signed)
Meloxicam 15mg  daily for 5 days then as needed EXOS for R hand See me again as needed

## 2022-12-02 NOTE — Assessment & Plan Note (Addendum)
Significant CMC arthritis noted.  No discussed with patient about this and patient's underlying autoimmune disease that likely contributed.  Patient given a custom Exos stability brace that hopefully will be beneficial while patient waits for good planning to have any surgical intervention.  Hold on any steroid injections with patient stating that they did not help in the past plus patient is considering surgery in the near future.  We discussed that bracing does help on this side and continues to have difficulty on the contralateral side we can consider that as well.  Follow-up with me again 6 to 8 weeks if needed for the contralateral side meloxicam given for pain relief

## 2022-12-18 ENCOUNTER — Other Ambulatory Visit (HOSPITAL_COMMUNITY): Payer: Self-pay

## 2022-12-25 ENCOUNTER — Ambulatory Visit (INDEPENDENT_AMBULATORY_CARE_PROVIDER_SITE_OTHER): Payer: Commercial Managed Care - PPO | Admitting: Psychiatry

## 2022-12-25 DIAGNOSIS — F411 Generalized anxiety disorder: Secondary | ICD-10-CM

## 2022-12-25 NOTE — Progress Notes (Addendum)
[facility] Counselor/Therapist Progress Note  Patient ID: [name], MRN: [id number],    Date: 12/25/2022  Time Spent: 55 minutes   Treatment Type: Individual Therapy  Reported Symptoms: Anxiety, anger, sleep is better.  Mental Status Exam:  Appearance:   Casual     Behavior:  Appropriate, Sharing, and Motivated  Motor:  Normal  Speech/Language:   Clear and Coherent  Affect:  anxious  Mood:  anxious  Thought process:  goal directed  Thought content:    WNL  Sensory/Perceptual disturbances:    WNL  Orientation:  oriented to person, place, time/date, situation, day of week, month of year, year, and stated date of December 25, 2022  Attention:  Good  Concentration:  Good  Memory:  WNL  Fund of knowledge:   Good  Insight:    Good  Judgment:   Good  Impulse Control:  Good   Risk Assessment: Danger to Self:  No Self-injurious Behavior: No Danger to Others: No Duty to Warn:no Physical Aggression / Violence:No  Access to Firearms a concern: No  Gang Involvement:No   Subjective:   Patient in session today reporting anxiety and anger.  Talks in session today about her anxiety and anger related mostly to relationship with his aging mother and some work issues, needing session today to process both of these stressors.  Anger with mother regarding personal issues and processed this in session looking at some options and ways of coping that can help her take the negative attention of her mother and focus more on her own coping and healthier ways.  Anger with mother regarding personal issues and discussed this further in session today.  Talked through her anger which seem to be helpful to patient.  Encouraged some strategies for better anger and frustration management.  Patient reports improved sleep and feels that this has helped a lot.  Anxiety mostly related to finances and worries about retirement which she shared in more detail today but realizes that she needs to "stop  worrying as she will be okay especially financially and that is what her fears have been based on".  Worked on having healthier cognitive patterns that can help her in coping with these issues.   Interventions: Cognitive Behavioral Therapy and Ego-Supportive  Long term goal: Develop healthy cognitive patterns and beliefs about self and the world that lead to alleviation of depression and anxiety and help prevent relapse.  Progressing: Patient is motivated. Struggling to focus some due to immediate, unexpected health concern with husband.  Short term goal: Learn and implement personal skills for managing stress, solving daily problems, and resolving conflicts effectively. Strategy: Teach patient calming skills, problem solving skills, and conflict resolution skills, to better manage daily stressors  Diagnosis:   ICD-10-CM   1. Generalized anxiety disorder  F41.1      Plan:  Patient in today and participating well in session as she continued to work on her anxiety, anger, and also supporting her improved sleep patterns.  She is definitely making progress and needs to continue working with goal-directed behaviors to move in a forward direction. Encouraged patient in practicing more positive and self affirming behaviors as discussed in session including: Allowing for better sleep patterns each night, staying on top of her medications as prescribed, taking breaks as needed throughout the day to experience more calmness, exercising on her bike to help deal with stress, deep breathing exercises, setting limits with people who stressor, staying in touch with supportive people, work  on letting go of negativity and over worrying in order to see more positive options for herself, healthy boundaries with others, and recognize the strengths she shows working with goal-directed behaviors to move in a direction that supports her improved emotional health and wellbeing.  Self rating scales: (updated  12/25/2022) 1-10 depression scale-4 1-10 anxiety scale-6 1-10 self-esteem scale- 6 1-10 motivation scale-6/7 1-10 hopefulness scale-6  Goal review and progress/challenges noted with patient.  Next appointment within 2 to 3 weeks.   Mathis Fare, LCSW

## 2023-01-21 ENCOUNTER — Other Ambulatory Visit: Payer: Self-pay

## 2023-01-22 DIAGNOSIS — Z6820 Body mass index (BMI) 20.0-20.9, adult: Secondary | ICD-10-CM | POA: Diagnosis not present

## 2023-01-22 DIAGNOSIS — Z01419 Encounter for gynecological examination (general) (routine) without abnormal findings: Secondary | ICD-10-CM | POA: Diagnosis not present

## 2023-01-22 DIAGNOSIS — Z13820 Encounter for screening for osteoporosis: Secondary | ICD-10-CM | POA: Diagnosis not present

## 2023-01-26 ENCOUNTER — Ambulatory Visit (INDEPENDENT_AMBULATORY_CARE_PROVIDER_SITE_OTHER): Payer: Commercial Managed Care - PPO | Admitting: Psychiatry

## 2023-01-26 DIAGNOSIS — F411 Generalized anxiety disorder: Secondary | ICD-10-CM | POA: Diagnosis not present

## 2023-01-26 NOTE — Progress Notes (Signed)
      [facility] Counselor/Therapist Progress Note  Patient ID: [name], MRN: [id number],    Date: 01/26/2023  Time Spent: 53 minutes   Treatment Type: Individual Therapy  Reported Symptoms:  anxiety, anger, sleep is better  Mental Status Exam:  Appearance:   Casual     Behavior:  Appropriate, Sharing, and Motivated  Motor:  Normal  Speech/Language:   Clear and Coherent  Affect:  Anxious, angry  Mood:  angry and anxious  Thought process:  goal directed  Thought content:    WNL  Sensory/Perceptual disturbances:    WNL  Orientation:  oriented to person, place, time/date, situation, day of week, month of year, year, and stated date of January 26, 2023  Attention:  Good  Concentration:  Good  Memory:  WNL  Fund of knowledge:   Good  Insight:    Good  Judgment:   Good  Impulse Control:  Good   Risk Assessment: Danger to Self:  No Self-injurious Behavior: No Danger to Others: No Duty to Warn:no Physical Aggression / Violence:No  Access to Firearms a concern: No  Gang Involvement:No   Subjective:  Patient in session today reporting symptoms of anxiety and anger, however sleep is better. States anxiety and anger are mostly related to relationship with husband and his family and her mother.Has cut back on the amount of tie she is spending with husband's side of family where there are a lot of unhealthy dynamics. Processing further relationship issues with her mother that feed patient's anger/resentment which we worked further on today, as well as increased anger management skills. Reports some improvement in not giving others in extended family as much power to negatively impact her, especially in situations patient cannot control. Continues to work on her anger and frustration with elderly mother and spoke more today about how she is trying to let go more and more of anger associated with family and/or work.  Interventions: Cognitive Behavioral Therapy  Diagnosis:    ICD-10-CM   1. Generalized anxiety disorder  F41.1      Plan: Patient in for session today, participating well and showing good motivation as she follow-through and more work on her anxiety, frustration, anger, and healthier sleep patterns.  Has continued to make some progress and needs to continue working with goal-directed behaviors moving her and they forward direction.Encouraged patient in her practice of more positive and self affirming behaviors as noted in session including: Making healthier sleep patterns a priority each night, staying on top of her medications as prescribed, taking breaks as needed throughout the day to experience more calmness, exercising on her bike to help deal with stress, deep breathing exercises, setting limits with people who are stressful for her, staying in touch with supportive people, working on letting go of negativity and over worrying in order to see more positive options for herself, healthy boundaries with others, and realize the strength she shows working with goal-directed behaviors to move in a direction that supports her improved emotional health and overall outlook.  Self rating scales: (updated 12/25/2022) 1-10 depression scale-6 1-10 anxiety scale-8 1-10 self-esteem scale- 6 1-10 motivation scale-9/10 1-10 hopefulness scale-8  Goal review and progress/challenges noted with patient.  Next appointment within 3 weeks.  Mathis Fare, LCSW

## 2023-02-03 ENCOUNTER — Encounter: Payer: Self-pay | Admitting: Family Medicine

## 2023-02-09 ENCOUNTER — Ambulatory Visit: Payer: Commercial Managed Care - PPO | Admitting: Family Medicine

## 2023-02-09 ENCOUNTER — Other Ambulatory Visit (HOSPITAL_COMMUNITY): Payer: Self-pay

## 2023-02-09 ENCOUNTER — Encounter: Payer: Self-pay | Admitting: Family Medicine

## 2023-02-09 VITALS — BP 125/77 | HR 56 | Temp 98.6°F | Wt 113.4 lb

## 2023-02-09 DIAGNOSIS — U071 COVID-19: Secondary | ICD-10-CM

## 2023-02-09 DIAGNOSIS — R051 Acute cough: Secondary | ICD-10-CM

## 2023-02-09 LAB — POC COVID19 BINAXNOW: SARS Coronavirus 2 Ag: POSITIVE — AB

## 2023-02-09 MED ORDER — MOLNUPIRAVIR EUA 200MG CAPSULE
4.0000 | ORAL_CAPSULE | Freq: Two times a day (BID) | ORAL | 0 refills | Status: AC
  Filled 2023-02-09 (×2): qty 40, 5d supply, fill #0

## 2023-02-09 MED ORDER — BENZONATATE 200 MG PO CAPS
200.0000 mg | ORAL_CAPSULE | Freq: Two times a day (BID) | ORAL | 0 refills | Status: DC | PRN
  Filled 2023-02-09 (×2): qty 20, 10d supply, fill #0

## 2023-02-09 MED ORDER — DOXYCYCLINE HYCLATE 100 MG PO TABS
100.0000 mg | ORAL_TABLET | Freq: Two times a day (BID) | ORAL | 0 refills | Status: DC
  Filled 2023-02-09: qty 20, 10d supply, fill #0

## 2023-02-09 NOTE — Patient Instructions (Signed)

## 2023-02-09 NOTE — Progress Notes (Signed)
[name] , [birth year], 67 y.o., female MRN: [id number] Patient Care Team    Relationship Specialty Notifications Start End  Natalia Leatherwood, DO PCP - General Family Medicine  12/29/18   Zenovia Jordan, MD Consulting Physician Rheumatology  04/09/17   Freddy Finner, MD (Inactive) Consulting Physician Obstetrics and Gynecology  04/09/17   Jacqlyn Krauss, MD Referring Physician Dermatology  04/09/17   Group, [facility]  12/29/18    Comment: [facility]    12/29/18   Iva Boop, MD Consulting Physician Gastroenterology  12/29/18   Julio Sicks, MD Consulting Physician Neurosurgery  12/29/18     Chief Complaint  Patient presents with   Fever     Subjective: [name] is a 67 y.o. Pt presents for an OV with complaints of cough, low grade fever of 3 days duration.  She is concerned she has COVID again. She has not tested for covid at home.  Pt has tried mucinex DM and nyquil to ease their symptoms.  She states 5 people at work have COVID.  She denies chills, shortness of breath or wheezing.       09/08/2022    1:13 PM 05/29/2022   10:44 AM 01/01/2022    2:16 PM 05/27/2021   10:37 AM 03/29/2021   11:34 AM  Depression screen PHQ 2/9  Decreased Interest 0 0 0 0 0  Down, Depressed, Hopeless 1 0 0 0 0  PHQ - 2 Score 1 0 0 0 0  Altered sleeping 3 3 1   0  Tired, decreased energy 1 0 0  0  Change in appetite 1 0 0  0  Feeling bad or failure about yourself  0 0 0  0  Trouble concentrating 0 0 0  0  Moving slowly or fidgety/restless 0 0 0  0  Suicidal thoughts 0 0 0  0  PHQ-9 Score 6 3 1   0  Difficult doing work/chores Not difficult at all    Not difficult at all    Allergies  Allergen Reactions   Aspartame And Phenylalanine Other (See Comments)    Migraines   Beef-Derived Products Other (See Comments)    severe cramping   Citrus Other (See Comments)    Causes Interstitial cystitis flares    Penicillins Hives and Rash    Denies airway involvement Has patient had a PCN reaction causing immediate rash, facial/tongue/throat swelling, SOB or lightheadedness with hypotension: yes hives.  Has patient had a PCN reaction causing severe rash involving mucus membranes or skin necrosis:no Has patient had a PCN reaction that required hospitalization No Has patient had a PCN reaction occurring within the last 10 years: No If all of the above answers are "NO", then may proceed with Cephalosporin use.    Promethazine Hcl Other (See Comments)    Muscle twitching and contracture    Beta Vulgaris Other (See Comments)    beets   Adhesive [Tape] Other (See Comments)    Red splotches   Morphine Sulfate Nausea And Vomiting   Social History   Social History Narrative   Spiritual Beliefs: methodist   Marital status/children/pets: In second marriage.  First husband committed suicide.  2 children.   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 RA, ? Lupus - reports saw rheumatologist in the past but better now, sees Dr. Dutch Quint   Eczema    Flushing 05/27/2021   GERD 02/13/2007   Qualifier: Diagnosis of  By: Jonny Ruiz MD, Len Blalock    GERD (gastroesophageal reflux disease)    takes Omeprazole daily, with nausea   Greater trochanteric bursitis of left hip 03/25/2019   Injected March 25, 2019   Heart murmur    Hemorrhoids    History of kidney stones    passed on her own   History of migraine    last one a yr ago and takes Zomig prn   Hot flashes 11/10/2014    HTN (hypertension) 03/31/2012   Hx of echocardiogram    Echo (8/15):  EF 55-60%, no RWMA   IBS (irritable bowel syndrome)    constipation predominant   Interstitial cystitis    hx of UTI   Irritable bowel syndrome 12/14/2009   Qualifier: Diagnosis of  By: Leone Payor MD, Charlyne Quale    Memory loss 12/22/2016   Numbness    both legs and related to back   Osteoporosis    osteopenia   Palpitations    on metoprolol in past but made BP too low   Pneumonia    hx of;last time 59yrs ago   PONV (postoperative nausea and vomiting)    laryngospasm-1999   Poor vision 10/15/2015   -? Ocular rosacea -sees opthomology    Raynaud's disease 10/21/2012   Raynaud's disease /phenomenon 10/21/2012   no meds, worse in the winter or cold environment.    Rheumatoid aortitis    uses Diclofenac gel;Rheumatoid   Seasonal allergies    Flonase daily    Strain of left piriformis muscle 05/18/2019   Urinary incontinence    Past Surgical History:  Procedure Laterality Date   ABDOMINAL HYSTERECTOMY  1997   BREAST BIOPSY  1999   benign   COLONOSCOPY  02/11/2013 and 12/24/04   internal and external hemorrhoids   ENDOMETRIAL ABLATION     EXCISION MORTON'S NEUROMA Right    FLEXIBLE SIGMOIDOSCOPY  03/07/2008   internal and external hemorrhoids   POSTERIOR LUMBAR FUSION  05/02/2013   L4-L5 fusion (cage/screws/bone graft) Dr. Jordan Likes   TONSILLECTOMY     UPPER GASTROINTESTINAL ENDOSCOPY  10/23/2010   gastritis, irregular Z-line   wisdom teeth extracted     Family History  Problem Relation Age of Onset   Diverticulosis Mother    Hypertension Mother    Osteoarthritis Mother    Depression Mother    Hyperlipidemia Mother    Kidney disease Mother    Coronary artery disease Father    Heart attack Father    Hypertension Father    Depression Father    Hyperlipidemia Father    Alcohol abuse Father    Early death Father    Kidney disease Father    Mental illness Father    Alcohol abuse Sister    Depression  Sister    Colon polyps Sister  Hypertension Maternal Grandmother    Stroke Maternal Grandmother    Hypertension Maternal Grandfather    Pancreatic cancer Maternal Grandfather    Early death Maternal Grandfather    Depression Paternal Grandmother    Dementia Paternal Grandmother    Arthritis Paternal Grandmother    Mental illness Paternal Grandmother    Stroke Paternal Grandfather    Hyperlipidemia Paternal Grandfather    Alcohol abuse Paternal Grandfather    Early death Paternal Grandfather    Hypertension Paternal Grandfather    Alcohol abuse Daughter    Depression Daughter    Drug abuse Daughter    Alcohol abuse Son    Depression Son    Diabetes Paternal Uncle    Colon cancer Neg Hx    Allergies as of 02/09/2023       Reactions   Aspartame And Phenylalanine Other (See Comments)   Migraines   Beef-derived Products Other (See Comments)   severe cramping   Citrus Other (See Comments)   Causes Interstitial cystitis flares   Penicillins Hives, Rash   Denies airway involvement Has patient had a PCN reaction causing immediate rash, facial/tongue/throat swelling, SOB or lightheadedness with hypotension: yes hives.  Has patient had a PCN reaction causing severe rash involving mucus membranes or skin necrosis:no Has patient had a PCN reaction that required hospitalization No Has patient had a PCN reaction occurring within the last 10 years: No If all of the above answers are "NO", then may proceed with Cephalosporin use.   Promethazine Hcl Other (See Comments)   Muscle twitching and contracture    Beta Vulgaris Other (See Comments)   beets   Adhesive [tape] Other (See Comments)   Red splotches   Morphine Sulfate Nausea And Vomiting        Medication List        Accurate as of February 09, 2023  3:02 PM. If you have any questions, ask your nurse or doctor.          STOP taking these medications    amLODipine 2.5 MG tablet Commonly known as: NORVASC Stopped by:  Felix Pacini   cloNIDine 0.1 MG tablet Commonly known as: CATAPRES Stopped by: Felix Pacini   DULoxetine 30 MG capsule Commonly known as: Cymbalta Stopped by: Felix Pacini   meclizine 25 MG tablet Commonly known as: ANTIVERT Stopped by: Felix Pacini   ondansetron 4 MG tablet Commonly known as: ZOFRAN Stopped by: Felix Pacini   valACYclovir 500 MG tablet Commonly known as: VALTREX Stopped by: Felix Pacini       TAKE these medications    albuterol 108 (90 Base) MCG/ACT inhaler Commonly known as: VENTOLIN HFA Inhale 2 puffs 15 minutes before exercise and as needed for wheeze.   ALPRAZolam 0.5 MG tablet Commonly known as: Xanax Take 1 tablet (0.5 mg total) by mouth 3 (three) times daily as needed for anxiety.   atenolol 25 MG tablet Commonly known as: TENORMIN Take 1 tablet (25 mg total) by mouth daily.   B-D 3CC LUER-LOK SYR 25GX1" 25G X 1" 3 ML Misc Generic drug: SYRINGE-NEEDLE (DISP) 3 ML Use as directed to inject b-12 into the skin What changed: Another medication with the same name was removed. Continue taking this medication, and follow the directions you see here. Changed by: Felix Pacini   benzonatate 200 MG capsule Commonly known as: TESSALON Take 1 capsule (200 mg total) by mouth 2 (two) times daily as needed for cough. Started by: Felix Pacini   cholecalciferol 1000 units  tablet Commonly known as: VITAMIN D Take 1,000 Units by mouth every evening.   cyanocobalamin 1000 MCG/ML injection Commonly known as: VITAMIN B12 Inject 1 mL (1,000 mcg total) into the muscle every 14 (fourteen) days.   doxycycline 100 MG tablet Commonly known as: VIBRA-TABS Take 1 tablet (100 mg total) by mouth 2 (two) times daily. Started by: Felix Pacini   fluticasone 50 MCG/ACT nasal spray Commonly known as: FLONASE Place 2 sprays into the nose daily.   ketoconazole 2 % shampoo Commonly known as: NIZORAL Use topically as directed for 30 days   lamoTRIgine 200 MG  tablet Commonly known as: LAMICTAL Take 1 tablet (200 mg total) by mouth at bedtime.   meloxicam 15 MG tablet Commonly known as: MOBIC Take 1 tablet (15 mg total) by mouth daily.   molnupiravir EUA 200 mg Caps capsule Commonly known as: LAGEVRIO Take 4 capsules (800 mg total) by mouth 2 (two) times daily for 5 days. Started by: Felix Pacini   Proctozone-HC 2.5 % rectal cream Generic drug: hydrocortisone Insert rectally 2 times daily for 10 days   QUEtiapine 50 MG tablet Commonly known as: SEROquel Take 1 tablet (50 mg total) by mouth at bedtime.        All past medical history, surgical history, allergies, family history, immunizations andmedications were updated in the EMR today and reviewed under the history and medication portions of their EMR.     Review of Systems  Constitutional:  Positive for fever and malaise/fatigue. Negative for chills.  HENT:  Positive for congestion, sinus pain and sore throat. Negative for ear discharge and ear pain.   Eyes:  Negative for pain, discharge and redness.  Respiratory:  Positive for cough. Negative for sputum production, shortness of breath and wheezing.   Gastrointestinal:  Negative for diarrhea, nausea and vomiting.  Skin:  Negative for rash.  Neurological:  Positive for headaches. Negative for dizziness.   Negative, with the exception of above mentioned in HPI   Objective:  BP 125/77   Pulse (!) 56   Temp 98.6 F (37 C)   Wt 113 lb 6.4 oz (51.4 kg)   SpO2 97%   BMI 20.74 kg/m  Body mass index is 20.74 kg/m. Physical Exam Vitals and nursing note reviewed.  Constitutional:      General: She is not in acute distress.    Appearance: Normal appearance. She is not ill-appearing, toxic-appearing or diaphoretic.  HENT:     Head: Normocephalic and atraumatic.     Right Ear: Tympanic membrane, ear canal and external ear normal.     Left Ear: Tympanic membrane, ear canal and external ear normal.     Nose: Nose normal.      Mouth/Throat:     Mouth: Mucous membranes are moist.     Pharynx: No oropharyngeal exudate or posterior oropharyngeal erythema.  Eyes:     General: No scleral icterus.       Right eye: No discharge.        Left eye: No discharge.     Extraocular Movements: Extraocular movements intact.     Conjunctiva/sclera: Conjunctivae normal.     Pupils: Pupils are equal, round, and reactive to light.  Cardiovascular:     Rate and Rhythm: Normal rate and regular rhythm.  Pulmonary:     Effort: Pulmonary effort is normal. No respiratory distress.     Breath sounds: Normal breath sounds. No wheezing, rhonchi or rales.  Skin:    General: Skin is warm.  Findings: No rash.  Neurological:     Mental Status: She is alert and oriented to person, place, and time. Mental status is at baseline.     Motor: No weakness.     Gait: Gait normal.  Psychiatric:        Mood and Affect: Mood normal.        Behavior: Behavior normal.        Thought Content: Thought content normal.        Judgment: Judgment normal.    No results found. No results found. Results for orders placed or performed in visit on 02/09/23 (from the past 24 hour(s))  POC COVID-19 BinaxNow     Status: Abnormal   Collection Time: 02/09/23  2:42 PM  Result Value Ref Range   SARS Coronavirus 2 Ag Positive (A) Negative    Assessment/Plan: [name] is a 67 y.o. female present for OV for  COVID-19/Acute cough - POC COVID-19 BinaxNow PCN allergic Rest, hydrate.  Continue mucinex (DM if cough)  Tessalon perles for cough Molnupiravir prescribed doxycycline prescribed, take until completed. (If started,. She understands to hold  script and get filled only if symptoms progress ober next 5-10 days Work excuse out for 5 days, then may return as long as fever fever free 24 hours.  If cough present it can last up to 6-8 weeks.  Reviewed home care instructions for COVID. Advised self-isolation at home for at least 5 days. After 5  days, if improved and fever resolved, can be in public, but should wear a mask around others for an additional 5 days. If symptoms, esp, dyspnea develops/worsens, recommend in-person evaluation at either an urgent care or the emergency room.  Return if symptoms worsen or fail to improve.    Reviewed expectations re: course of current medical issues. Discussed self-management of symptoms. Outlined signs and symptoms indicating need for more acute intervention. Patient verbalized understanding and all questions were answered. Patient received an After-Visit Summary.    Orders Placed This Encounter  Procedures   POC COVID-19 BinaxNow   Meds ordered this encounter  Medications   molnupiravir EUA (LAGEVRIO) 200 mg CAPS capsule    Sig: Take 4 capsules (800 mg total) by mouth 2 (two) times daily for 5 days.    Dispense:  40 capsule    Refill:  0   doxycycline (VIBRA-TABS) 100 MG tablet    Sig: Take 1 tablet (100 mg total) by mouth 2 (two) times daily.    Dispense:  20 tablet    Refill:  0   benzonatate (TESSALON) 200 MG capsule    Sig: Take 1 capsule (200 mg total) by mouth 2 (two) times daily as needed for cough.    Dispense:  20 capsule    Refill:  0   Referral Orders  No referral(s) requested today     Note is dictated utilizing voice recognition software. Although note has been proof read prior to signing, occasional typographical errors still can be missed. If any questions arise, please do not hesitate to call for verification.   electronically signed by:  Felix Pacini, DO  [facility] [place]

## 2023-02-10 ENCOUNTER — Other Ambulatory Visit: Payer: Self-pay

## 2023-02-10 ENCOUNTER — Encounter: Payer: Self-pay | Admitting: Pharmacist

## 2023-02-10 ENCOUNTER — Other Ambulatory Visit (HOSPITAL_COMMUNITY): Payer: Self-pay

## 2023-02-12 ENCOUNTER — Ambulatory Visit: Payer: Commercial Managed Care - PPO | Admitting: Psychiatry

## 2023-02-13 ENCOUNTER — Other Ambulatory Visit (HOSPITAL_COMMUNITY): Payer: Self-pay

## 2023-02-14 ENCOUNTER — Other Ambulatory Visit (HOSPITAL_COMMUNITY): Payer: Self-pay

## 2023-02-16 ENCOUNTER — Ambulatory Visit: Payer: Commercial Managed Care - PPO | Admitting: Adult Health

## 2023-02-17 DIAGNOSIS — M1811 Unilateral primary osteoarthritis of first carpometacarpal joint, right hand: Secondary | ICD-10-CM | POA: Diagnosis not present

## 2023-02-25 ENCOUNTER — Ambulatory Visit (INDEPENDENT_AMBULATORY_CARE_PROVIDER_SITE_OTHER): Payer: Commercial Managed Care - PPO | Admitting: Psychiatry

## 2023-02-25 DIAGNOSIS — F331 Major depressive disorder, recurrent, moderate: Secondary | ICD-10-CM | POA: Diagnosis not present

## 2023-02-25 NOTE — Progress Notes (Signed)
[facility] Counselor/Therapist Progress Note  Patient ID: [name], MRN: [id number],    Date: 02/25/2023  Time Spent: 53 minutes   Treatment Type: Individual Therapy  Reported Symptoms:  anger, depression, anxiety, sleep "not too good"    Mental Status Exam:  Appearance:   Casual     Behavior:  Appropriate, Sharing, and Motivated  Motor:  Normal  Speech/Language:   Clear and Coherent  Affect:  Depressed  Mood:  anxious and depressed  Thought process:  goal directed  Thought content:    WNL  Sensory/Perceptual disturbances:    WNL  Orientation:  oriented to person, place, time/date, situation, day of week, month of year, year, and stated date of Aug. 14, 2024  Attention:  Good  Concentration:  Good  Memory:  WNL  Fund of knowledge:   Good  Insight:    Good  Judgment:   Good  Impulse Control:  Good   Risk Assessment: Danger to Self:  No Self-injurious Behavior: No Danger to Others: No Duty to Warn:no Physical Aggression / Violence:No  Access to Firearms a concern: No  Gang Involvement:No   Subjective:  Patient in session today and reporting anger, depression, anxiety, and some sleep difficulty. Is checking with her med provide re: possible med change for sleep. Current increase in anger, depression, and anxiety mostly related to family (mother, husband) and work stressors. (Not all details included in this note due to patient privacy needs.) Acknowledges her hanging onto very hurtful experiences and ways mom has treated her, and does seem to be more committed to the "letting go" that we have spoken about on prior occasions. Looked at ways of "letting go" of negative things that have been said to her rather than letting her anger continue to grow, be able to interrupt some of the negative cycles of thinking so all the negatives don't continue to "stick with her" and keep her from having healthier relationships and the ability to trust people more, leading to  healthier relationships with some, and much needed boundaries with others.  Interventions: Cognitive Behavioral Therapy and Ego-Supportive  Long term goal: Develop healthy cognitive patterns and beliefs about self and the world that lead to alleviation of depression and anxiety and help prevent relapse.  Progressing: Patient is motivated. Struggling to focus some due to immediate, unexpected health concern with husband.  Short term goal: Learn and implement personal skills for managing stress, solving daily problems, and resolving conflicts effectively. Strategy: Teach patient calming skills, problem solving skills, and conflict resolution skills, to better manage daily stressors  Diagnosis:   ICD-10-CM   1. 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  Worked well in session today on some of these issues and she is noticing some improvement continuing although at times will get so angry regarding some family issues and interactions that she gets "sidelined" at times but then after talking through it, it seems to be better and can move forward.  Working with patient on better approach and working with anger and particularly and not holding onto it, and rather being able to let go of it and move forward.  She has shown progress and needs to continue working with goal-directed behaviors to move in a forward direction. Reminded and encouraged patient in her practice of more positive and self affirming behaviors as noted  in session including: Making healthier sleep patterns a priority each night, taking her medications as prescribed, exercising on her bike to help deal with stress, taking breaks as needed throughout the day to experience more calmness, deep breathing exercises, setting limits with people who are stressful for her, remain in touch with supportive people,  working on letting go of negativity and over-worrying in order to see more positive options for herself, healthy boundaries with others, and recognize the strengths she shows working with goal-directed behaviors to move in a direction that supports her improved emotional health and overall wellbeing.   Goal review and progress/challenges noted with patient.  Next appointment within 3 weeks.   Mathis Fare, LCSW

## 2023-02-26 ENCOUNTER — Ambulatory Visit
Admission: EM | Admit: 2023-02-26 | Discharge: 2023-02-26 | Disposition: A | Discharge status: Home / Self Care | Payer: Commercial Managed Care - PPO | Attending: Internal Medicine | Admitting: Internal Medicine

## 2023-02-26 DIAGNOSIS — L03011 Cellulitis of right finger: Secondary | ICD-10-CM

## 2023-02-26 MED ORDER — CEPHALEXIN 500 MG PO CAPS
500.0000 mg | ORAL_CAPSULE | Freq: Four times a day (QID) | ORAL | 0 refills | Status: AC
  Filled 2023-02-26 – 2023-02-27 (×2): qty 28, 7d supply, fill #0

## 2023-02-26 NOTE — ED Triage Notes (Signed)
Pt presents to UC w/ c/o right hand middle finger infection for about 10 days. Pt has used peroxide, alcohol, iodine without relief.

## 2023-02-26 NOTE — Discharge Instructions (Signed)
Start Keflex 4 times a day for 7 days.  You may do Epsom salt/soapy water soaks to the finger as needed.  Please follow-up with your PCP if your symptoms are not improving.  Please go to the emergency room if you develop any worsening symptoms.  I hope you feel better soon!

## 2023-02-26 NOTE — ED Provider Notes (Signed)
[facility]    CSN: [id number] Arrival date & time: 02/26/23  1640      History   Chief Complaint No chief complaint on file.   HPI [name] is a 67 y.o. female presents for evaluation of a finger infection.  Patient reports 10 days of swelling, redness, pain to the medial aspect of her right distal finger.  Thinks it started out as a hangnail.  Denies any drainage, fevers or chills.  No MRSA history.  Denies biting her nails.  She has been using peroxide, alcohol, and iodine to the area without improvement.  No other concerns at this time.  HPI  Past Medical History:  Diagnosis Date   Allergy    Anemia    after birth of child 27yrs ago   Anxiety and depression    with insomnia   Autoimmune disease (HCC) 08/06/2020   Blood transfusion without reported diagnosis    x3   Chronic back pain    ? RA, ? Lupus - reports saw rheumatologist in the past but better now, sees Dr. Dutch Quint   Eczema    Flushing 05/27/2021   GERD 02/13/2007   Qualifier: Diagnosis of  By: Jonny Ruiz MD, Len Blalock    GERD (gastroesophageal reflux disease)    takes Omeprazole daily, with nausea   Greater trochanteric bursitis of left hip 03/25/2019   Injected March 25, 2019   Heart murmur    Hemorrhoids    History of kidney stones    passed on her own   History of migraine    last one a yr ago and takes Zomig prn   Hot flashes 11/10/2014   HTN (hypertension) 03/31/2012   Hx of echocardiogram    Echo (8/15):  EF 55-60%, no RWMA   IBS (irritable bowel syndrome)    constipation predominant   Interstitial cystitis    hx of UTI   Irritable bowel syndrome 12/14/2009   Qualifier: Diagnosis of  By: Leone Payor MD, Charlyne Quale    Memory loss 12/22/2016   Numbness    both legs and related to back   Osteoporosis    osteopenia   Palpitations    on metoprolol in past but made BP too low   Pneumonia    hx of;last time 15yrs ago   PONV (postoperative nausea and vomiting)    laryngospasm-1999    Poor vision 10/15/2015   -? Ocular rosacea -sees opthomology    Raynaud's disease 10/21/2012   Raynaud's disease /phenomenon 10/21/2012   no meds, worse in the winter or cold environment.    Rheumatoid aortitis    uses Diclofenac gel;Rheumatoid   Seasonal allergies    Flonase daily    Strain of left piriformis muscle 05/18/2019   Urinary incontinence     Patient Active Problem List   Diagnosis Date Noted   Mass of upper outer quadrant of left breast 05/29/2022   Primary hypertension 02/14/2022   Encounter for long-term current use of medication 05/27/2021   Autoimmune disease (HCC) 08/06/2020   Arthritis of carpometacarpal (CMC) joint of right thumb 05/08/2020   Cervical spondylosis 12/19/2019   B12 deficiency 11/03/2019   Status post lumbar spinal fusion 08/03/2019   Spondylosis without myelopathy or radiculopathy, lumbar region 08/03/2019   DDD (degenerative disc disease), lumbar 05/18/2019   Panic disorder 01/05/2019   Sleep disturbance 12/29/2018   Eczema 12/29/2018   PTSD (post-traumatic stress disorder) 05/30/2018   Recurrent major depression in partial remission (HCC) 11/10/2014  Hot flashes 11/10/2014   Carpal tunnel syndrome 08/24/2013   Spinal stenosis, lumbar region, 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 Jordan Likes   TONSILLECTOMY     UPPER GASTROINTESTINAL ENDOSCOPY  10/23/2010   gastritis, irregular Z-line   wisdom teeth extracted      OB History     Gravida  2   Para  2   Term       Preterm      AB      Living  2      SAB      IAB      Ectopic      Multiple      Live Births               Home Medications    Prior to Admission medications   Medication Sig Start Date End Date Taking? Authorizing Provider  cephALEXin (KEFLEX) 500 MG capsule Take 1 capsule (500 mg total) by mouth 4 (four) times daily for 7 days. 02/26/23 03/05/23 Yes Radford Pax, NP  albuterol (VENTOLIN HFA) 108 (90 Base) MCG/ACT inhaler Inhale 2 puffs 15 minutes before exercise and as needed for wheeze. 09/08/22   Sheliah Hatch, MD  ALPRAZolam Prudy Feeler) 0.5 MG tablet Take 1 tablet (0.5 mg total) by mouth 3 (three) times daily as needed for anxiety. 09/17/22   Mozingo, Thereasa Solo, NP  atenolol (TENORMIN) 25 MG tablet Take 1 tablet (25 mg total) by mouth daily. 05/29/22   Kuneff, Renee A, DO  benzonatate (TESSALON) 200 MG capsule Take 1 capsule (200 mg total) by mouth 2 (two) times daily as needed for cough. 02/09/23   Kuneff, Renee A, DO  cholecalciferol (VITAMIN D) 1000 UNITS tablet Take 1,000 Units by mouth every evening.    [provider]  cyanocobalamin (VITAMIN B12) 1000 MCG/ML injection Inject 1 mL (1,000 mcg total) into the muscle every 14 (fourteen) days. 05/29/22   Kuneff, Renee A, DO  doxycycline (VIBRA-TABS) 100 MG tablet Take 1 tablet (100 mg total) by mouth 2 (two) times daily. 02/09/23   Kuneff, Renee A, DO  fluticasone (FLONASE) 50 MCG/ACT nasal spray Place 2 sprays into the nose daily. 04/13/13   Eulis Foster, FNP  hydrocortisone (ANUSOL-HC) 2.5 % rectal cream Insert rectally 2 times daily for 10 days 12/05/21   Iva Boop, MD  ketoconazole (NIZORAL) 2 % shampoo Use topically as directed for 30 days 08/27/22     lamoTRIgine (LAMICTAL) 200 MG tablet Take 1 tablet (200 mg total) by mouth at bedtime. 04/23/22   Mozingo, Thereasa Solo, NP  meloxicam (MOBIC) 15 MG tablet Take 1 tablet (15 mg total) by mouth daily. 12/02/22   Judi Saa, DO  QUEtiapine  (SEROQUEL) 50 MG tablet Take 1 tablet (50 mg total) by mouth at bedtime. 11/25/22   Mozingo, Thereasa Solo, NP  SYRINGE-NEEDLE, DISP, 3 ML (B-D 3CC LUER-LOK SYR 25GX1") 25G X 1" 3 ML MISC Use as directed to inject b-12 into the skin 05/29/22  Natalia Leatherwood, DO    Family History Family History  Problem Relation Age of Onset   Diverticulosis Mother    Hypertension Mother    Osteoarthritis Mother    Depression Mother    Hyperlipidemia Mother    Kidney disease Mother    Coronary artery disease Father    Heart attack Father    Hypertension Father    Depression Father    Hyperlipidemia Father    Alcohol abuse Father    Early death Father    Kidney disease Father    Mental illness Father    Alcohol abuse Sister    Depression Sister    Colon polyps Sister    Hypertension Maternal Grandmother    Stroke Maternal Grandmother    Hypertension Maternal Grandfather    Pancreatic cancer Maternal Grandfather    Early death Maternal Grandfather    Depression Paternal Grandmother    Dementia Paternal Grandmother    Arthritis Paternal Grandmother    Mental illness Paternal Grandmother    Stroke Paternal Grandfather    Hyperlipidemia Paternal Grandfather    Alcohol abuse Paternal Grandfather    Early death Paternal Grandfather    Hypertension Paternal Grandfather    Alcohol abuse Daughter    Depression Daughter    Drug abuse Daughter    Alcohol abuse Son    Depression Son    Diabetes Paternal Uncle    Colon cancer Neg Hx     Social History Social History   Tobacco Use   Smoking status: Never   Smokeless tobacco: Never  Vaping Use   Vaping status: Never Used  Substance Use Topics   Alcohol use: Yes    Alcohol/week: 2.0 standard drinks of alcohol    Types: 1 Shots of liquor, 1 Standard drinks or equivalent per week    Comment: one margarita a week   Drug use: No     Allergies   Aspartame and phenylalanine, Beef-derived products, Citrus, Penicillins, Promethazine hcl,  Beta vulgaris, Adhesive [tape], and Morphine sulfate   Review of Systems Review of Systems  Skin:        Finger infection     Physical Exam Triage Vital Signs ED Triage Vitals  Encounter Vitals Group     BP 02/26/23 1649 (!) 147/73     Systolic BP Percentile --      Diastolic BP Percentile --      Pulse Rate 02/26/23 1649 71     Resp 02/26/23 1649 17     Temp 02/26/23 1649 99.1 F (37.3 C)     Temp Source 02/26/23 1649 Oral     SpO2 02/26/23 1649 97 %     Weight --      Height --      Head Circumference --      Peak Flow --      Pain Score 02/26/23 1656 4     Pain Loc --      Pain Education --      Exclude from Growth Chart --    No data found.  Updated Vital Signs BP (!) 147/73 (BP Location: Left Arm)   Pulse 71   Temp 99.1 F (37.3 C) (Oral)   Resp 17   SpO2 97%   Visual Acuity Right Eye Distance:   Left Eye Distance:   Bilateral Distance:    Right Eye Near:   Left Eye Near:    Bilateral Near:     Physical Exam Vitals and nursing note reviewed.  Constitutional:      General: She is not in acute distress.    Appearance: Normal appearance. She is not ill-appearing.  HENT:     Head: Normocephalic and atraumatic.  Eyes:     Pupils: Pupils are equal, round, and reactive to light.  Cardiovascular:     Rate and Rhythm: Normal rate.  Pulmonary:     Effort: Pulmonary effort is normal.  Musculoskeletal:       Hands:     Comments: Mild erythema with some tenderness to palpation minimal warmth to the medial aspect of the distal right third finger.  No induration or fluctuance.  Nail is intact.  Cap refill +2.  Skin:    General: Skin is warm and dry.  Neurological:     General: No focal deficit present.     Mental Status: She is alert and oriented to person, place, and time.  Psychiatric:        Mood and Affect: Mood normal.        Behavior: Behavior normal.      UC Treatments / Results  Labs (all labs ordered are listed, but only abnormal  results are displayed) Labs Reviewed - No data to display  EKG   Radiology No results found.  Procedures Procedures (including critical care time)  Medications Ordered in UC Medications - No data to display  Initial Impression / Assessment and Plan / UC Course  I have reviewed the triage vital signs and the nursing notes.  Pertinent labs & imaging results that were available during my care of the patient were reviewed by me and considered in my medical decision making (see chart for details).     Reviewed exam and symptoms with patient.  No red flags.  Will start Keflex for paronychia.  Epsom salt soaks/soapy water soaks as needed.  PCP follow-up if symptoms do not improve.  ER precautions reviewed and patient verbalized understanding Final Clinical Impressions(s) / UC Diagnoses   Final diagnoses:  Paronychia of finger of right hand     Discharge Instructions      Start Keflex 4 times a day for 7 days.  You may do Epsom salt/soapy water soaks to the finger as needed.  Please follow-up with your PCP if your symptoms are not improving.  Please go to the emergency room if you develop any worsening symptoms.  I hope you feel better soon!   ED Prescriptions     Medication Sig Dispense Auth. Provider   cephALEXin (KEFLEX) 500 MG capsule Take 1 capsule (500 mg total) by mouth 4 (four) times daily for 7 days. 28 capsule Radford Pax, NP      PDMP not reviewed this encounter.   Radford Pax, NP 02/26/23 [phone]

## 2023-02-27 ENCOUNTER — Other Ambulatory Visit (HOSPITAL_COMMUNITY): Payer: Self-pay

## 2023-03-03 ENCOUNTER — Ambulatory Visit: Payer: Commercial Managed Care - PPO | Admitting: Psychiatry

## 2023-03-11 ENCOUNTER — Ambulatory Visit: Payer: Commercial Managed Care - PPO | Admitting: Psychiatry

## 2023-03-11 ENCOUNTER — Other Ambulatory Visit (HOSPITAL_COMMUNITY): Payer: Self-pay

## 2023-03-11 ENCOUNTER — Telehealth: Payer: Self-pay | Admitting: Internal Medicine

## 2023-03-11 MED ORDER — OMEPRAZOLE 40 MG PO CPDR
40.0000 mg | DELAYED_RELEASE_CAPSULE | Freq: Every day | ORAL | 3 refills | Status: DC
  Filled 2023-03-11: qty 90, 90d supply, fill #0

## 2023-03-11 NOTE — Telephone Encounter (Signed)
Having heartburn again and asking for refill ofomeprazole

## 2023-03-12 ENCOUNTER — Other Ambulatory Visit (HOSPITAL_COMMUNITY): Payer: Self-pay

## 2023-03-12 ENCOUNTER — Other Ambulatory Visit: Payer: Self-pay

## 2023-03-12 ENCOUNTER — Ambulatory Visit (INDEPENDENT_AMBULATORY_CARE_PROVIDER_SITE_OTHER): Payer: Commercial Managed Care - PPO | Admitting: Adult Health

## 2023-03-12 ENCOUNTER — Encounter: Payer: Self-pay | Admitting: Adult Health

## 2023-03-12 DIAGNOSIS — F4310 Post-traumatic stress disorder, unspecified: Secondary | ICD-10-CM

## 2023-03-12 DIAGNOSIS — F331 Major depressive disorder, recurrent, moderate: Secondary | ICD-10-CM

## 2023-03-12 DIAGNOSIS — G4700 Insomnia, unspecified: Secondary | ICD-10-CM

## 2023-03-12 DIAGNOSIS — F411 Generalized anxiety disorder: Secondary | ICD-10-CM

## 2023-03-12 MED ORDER — ALPRAZOLAM 0.5 MG PO TABS
0.5000 mg | ORAL_TABLET | Freq: Three times a day (TID) | ORAL | 2 refills | Status: DC | PRN
  Filled 2023-03-12: qty 90, 30d supply, fill #0
  Filled 2023-06-22: qty 90, 30d supply, fill #1

## 2023-03-12 MED ORDER — LAMOTRIGINE 200 MG PO TABS
200.0000 mg | ORAL_TABLET | Freq: Every day | ORAL | 3 refills | Status: DC
  Filled 2023-03-12 – 2023-05-30 (×2): qty 90, 90d supply, fill #0
  Filled 2023-08-27: qty 90, 90d supply, fill #1
  Filled 2023-12-10 – 2023-12-18 (×2): qty 90, 90d supply, fill #2

## 2023-03-12 MED ORDER — QUETIAPINE FUMARATE 50 MG PO TABS
50.0000 mg | ORAL_TABLET | Freq: Every day | ORAL | 1 refills | Status: DC
  Filled 2023-03-12: qty 90, 90d supply, fill #0
  Filled 2023-06-22: qty 90, 90d supply, fill #1
  Filled 2023-10-10: qty 7, 7d supply, fill #2

## 2023-03-12 NOTE — Progress Notes (Signed)
[name] [id number] 1955/08/30 67 y.o.  Subjective:   Patient ID:  [name] is a 67 y.o. (DOB 04/21/1956) female.  Chief Complaint: No chief complaint on file.   HPI [name] presents to the office today for follow-up of MDD, GAD, panic disorder, insomnia and PTSD.  Describes mood today as "a little better". Pleasant. Reports tearfulness. Mood symptoms - reports anxiety, depression, and irritability. Reports panic attacks - getting hot and sweating. Reports worry, rumination, and over thinking. Mood is lower. Stating "I'm doing fair - treading water". Feels like the addition of Seroquel has been helpful for sleep, but not working as well as it did initially - feels like it is more situational. Varying interest and motivation. Seeing therapist - Rockne Menghini. Taking medications as prescribed.  Energy levels stable. Active, has a regular exercise routine - biking 60 to 80 miles a week. Enjoys some usual interests and activities. Married. Lives with husband of 16 years and 5 dogs. Mother lives in [place]. Spending time with family. Appetite adequate. Weight stable - 112 to 116 pounds. Sleeps has improved. Averages 6 hours. Focus and concentration improved. Completing tasks. Managing aspects of household. 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  Denies SI or HI.  Denies AH or VH. Denies self harm. Denies substance use.  Previous medication trials: Remeron, Trazadone, Restoril, Xanax, Seroquel, Belsomra   GAD-7    Flowsheet Row Office Visit from 05/29/2022 in [facility] Visit from 01/01/2022 in [facility] Office Visit from 04/27/2019 in [facility] [place] Office Visit from 12[facility]29/2018 in [facility]  Total GAD-7 Score 11 7 11 18       Mini-Mental    Flowsheet Row Office Visit from 12/22/2016 in [facility]  Total Score (max 30 points ) 30      PHQ2-9    Flowsheet Row Office Visit from 09/08/2022 in [facility] Visit from 05/29/2022 in [facility] [place] Office Visit from 01/01/2022 in [facility] [place] Office Visit from 05/27/2021 in [facility] [place] Office Visit from 03/29/2021 in [facility] Total Score 1 0 0 0 0  PHQ-9 Total Score 6 3 1  -- 0      Flowsheet Row ED from 02/26/2023 in [facility])  C-SSRS RISK CATEGORY No Risk        Review of Systems:  Review of Systems  Musculoskeletal:  Negative for gait problem.  Neurological:  Negative for tremors.  Psychiatric/Behavioral:         Please refer to HPI    Medications: I have reviewed the patient's current medications.  Current Outpatient Medications  Medication Sig Dispense Refill   albuterol (VENTOLIN HFA) 108 (90 Base) MCG/ACT inhaler Inhale 2 puffs 15 minutes before exercise and as needed for wheeze. 6.7 g 5   ALPRAZolam (XANAX) 0.5 MG tablet Take 1 tablet (0.5 mg total) by mouth 3 (three) times daily as needed for anxiety. 90 tablet 2   atenolol (TENORMIN) 25 MG tablet Take 1 tablet (25 mg total) by mouth daily. 90 tablet 3   benzonatate (TESSALON) 200 MG capsule Take 1 capsule (200 mg total) by mouth 2 (two) times daily as  needed for cough. 20 capsule 0   cholecalciferol (VITAMIN D) 1000 UNITS tablet Take 1,000 Units by mouth every evening.     cyanocobalamin (VITAMIN B12) 1000 MCG/ML injection Inject 1 mL (1,000 mcg total) into the muscle every 14 (fourteen) days. 6 mL 3   doxycycline (VIBRA-TABS) 100 MG tablet Take 1 tablet (100 mg total) by mouth 2 (two) times daily. 20 tablet 0   fluticasone (FLONASE) 50 MCG/ACT nasal spray Place 2 sprays into the nose daily. 16 g 5   hydrocortisone (ANUSOL-HC)  2.5 % rectal cream Insert rectally 2 times daily for 10 days 30 g 1   ketoconazole (NIZORAL) 2 % shampoo Use topically as directed for 30 days 120 mL 5   lamoTRIgine (LAMICTAL) 200 MG tablet Take 1 tablet (200 mg total) by mouth at bedtime. 90 tablet 3   meloxicam (MOBIC) 15 MG tablet Take 1 tablet (15 mg total) by mouth daily. 90 tablet 0   omeprazole (PRILOSEC) 40 MG capsule Take 1 capsule (40 mg total) by mouth daily. 90 capsule 3   QUEtiapine (SEROQUEL) 50 MG tablet Take 1 tablet (50 mg total) by mouth at bedtime. 90 tablet 1   SYRINGE-NEEDLE, DISP, 3 ML (B-D 3CC LUER-LOK SYR 25GX1") 25G X 1" 3 ML MISC Use as directed to inject b-12 into the skin 6 each 3   No current facility-administered medications for this visit.    Medication Side Effects: None  Allergies:  Allergies  Allergen Reactions   Aspartame And Phenylalanine Other (See Comments)    Migraines   Beef-Derived Products Other (See Comments)    severe cramping   Citrus Other (See Comments)    Causes Interstitial cystitis flares   Penicillins Hives and Rash    Denies airway involvement Has patient had a PCN reaction causing immediate rash, facial/tongue/throat swelling, SOB or lightheadedness with hypotension: yes hives.  Has patient had a PCN reaction causing severe rash involving mucus membranes or skin necrosis:no Has patient had a PCN reaction that required hospitalization No Has patient had a PCN reaction occurring within the last 10 years: No If all of the above answers are "NO", then may proceed with Cephalosporin use.    Promethazine Hcl Other (See Comments)    Muscle twitching and contracture    Beta Vulgaris Other (See Comments)    beets   Adhesive [Tape] Other (See Comments)    Red splotches   Morphine Sulfate Nausea And Vomiting    Past Medical History:  Diagnosis Date   Allergy    Anemia    after birth of child 62yrs ago   Anxiety and depression    with insomnia   Autoimmune disease (HCC)  08/06/2020   Blood transfusion without reported diagnosis    x3   Chronic back pain    ? RA, ? Lupus - reports saw rheumatologist in the past but better now, sees Dr. Dutch Quint   Eczema    Flushing 05/27/2021   GERD 02/13/2007   Qualifier: Diagnosis of  By: Jonny Ruiz MD, Len Blalock    GERD (gastroesophageal reflux disease)    takes Omeprazole daily, with nausea   Greater trochanteric bursitis of left hip 03/25/2019   Injected March 25, 2019   Heart murmur    Hemorrhoids    History of kidney stones    passed on her own   History of migraine    last one a yr ago and takes Zomig prn   Hot flashes 11/10/2014   HTN (hypertension) 03/31/2012  Hx of echocardiogram    Echo (8/15):  EF 55-60%, no RWMA   IBS (irritable bowel syndrome)    constipation predominant   Interstitial cystitis    hx of UTI   Irritable bowel syndrome 12/14/2009   Qualifier: Diagnosis of  By: Leone Payor MD, Charlyne Quale    Memory loss 12/22/2016   Numbness    both legs and related to back   Osteoporosis    osteopenia   Palpitations    on metoprolol in past but made BP too low   Pneumonia    hx of;last time 69yrs ago   PONV (postoperative nausea and vomiting)    laryngospasm-1999   Poor vision 10/15/2015   -? Ocular rosacea -sees opthomology    Raynaud's disease 10/21/2012   Raynaud's disease /phenomenon 10/21/2012   no meds, worse in the winter or cold environment.    Rheumatoid aortitis    uses Diclofenac gel;Rheumatoid   Seasonal allergies    Flonase daily    Strain of left piriformis muscle 05/18/2019   Urinary incontinence     Past Medical History, Surgical history, Social history, and Family history were reviewed and updated as appropriate.   Please see review of systems for further details on the patient's review from today.   Objective:   Physical Exam:  There were no vitals taken for this visit.  Physical Exam Constitutional:      General: She is not in acute distress. Musculoskeletal:         General: No deformity.  Neurological:     Mental Status: She is alert and oriented to person, place, and time.     Coordination: Coordination normal.  Psychiatric:        Attention and Perception: Attention and perception normal. She does not perceive auditory or visual hallucinations.        Mood and Affect: Affect is not labile, blunt, angry or inappropriate.        Speech: Speech normal.        Behavior: Behavior normal.        Thought Content: Thought content normal. Thought content is not paranoid or delusional. Thought content does not include homicidal or suicidal ideation. Thought content does not include homicidal or suicidal plan.        Cognition and Memory: Cognition and memory normal.        Judgment: Judgment normal.     Comments: Insight intact     Lab Review:     Component Value Date/Time   NA 141 05/29/2022 1032   NA 143 12/22/2016 0948   K 4.8 05/29/2022 1032   CL 105 05/29/2022 1032   CO2 31 05/29/2022 1032   GLUCOSE 85 05/29/2022 1032   GLUCOSE 95 04/21/2006 1635   BUN 19 05/29/2022 1032   BUN 17 12/22/2016 0948   CREATININE 1.01 05/29/2022 1032   CALCIUM 9.5 05/29/2022 1032   PROT 6.5 05/29/2022 1032   PROT 7.0 12/22/2016 0948   ALBUMIN 4.5 05/29/2022 1032   ALBUMIN 4.7 12/22/2016 0948   AST 23 05/29/2022 1032   ALT 16 05/29/2022 1032   ALKPHOS 84 05/29/2022 1032   BILITOT 0.5 05/29/2022 1032   BILITOT 0.3 12/22/2016 0948   GFRNONAA 65 12/22/2016 0948   GFRAA 75 12/22/2016 0948       Component Value Date/Time   WBC 4.1 05/29/2022 1032   RBC 4.70 05/29/2022 1032   HGB 14.1 05/29/2022 1032   HGB 14.1 12/22/2016 0948   HCT 44.2 05/29/2022  1032   HCT 44.0 12/22/2016 0948   PLT 196.0 05/29/2022 1032   PLT 206 12/22/2016 0948   MCV 94.0 05/29/2022 1032   MCV 94 12/22/2016 0948   MCH 30.3 12/22/2016 0948   MCH 30.7 11/01/2015 1154   MCHC 32.0 05/29/2022 1032   RDW 13.8 05/29/2022 1032   RDW 14.3 12/22/2016 0948   LYMPHSABS 1.3 05/29/2022  1032   LYMPHSABS 0.9 12/22/2016 0948   MONOABS 0.4 05/29/2022 1032   EOSABS 0.2 05/29/2022 1032   EOSABS 0.1 12/22/2016 0948   BASOSABS 0.0 05/29/2022 1032   BASOSABS 0.0 12/22/2016 0948    No results found for: "POCLITH", "LITHIUM"   No results found for: "PHENYTOIN", "PHENOBARB", "VALPROATE", "CBMZ"   .res Assessment: Plan:    Plan:  PDMP reviewed  Xanax 0.5mg  TID - decreased use Lamictal 200mg  daily Seroquel 50mg  at bedtime     RTC 3 months  Patient advised to contact office with any questions, adverse effects, or acute worsening in signs and symptoms.  Discussed potential benefits, risk, and side effects of benzodiazepines to include potential risk of tolerance and dependence, as well as possible drowsiness.  Advised patient not to drive if experiencing drowsiness and to take lowest possible effective dose to minimize risk of dependence and tolerance.  Diagnoses and all orders for this visit:  Moderate recurrent major depression (HCC)  Generalized anxiety disorder -     ALPRAZolam (XANAX) 0.5 MG tablet; Take 1 tablet (0.5 mg total) by mouth 3 (three) times daily as needed for anxiety.  PTSD (post-traumatic stress disorder) -     lamoTRIgine (LAMICTAL) 200 MG tablet; Take 1 tablet (200 mg total) by mouth at bedtime.  Insomnia, unspecified type -     QUEtiapine (SEROQUEL) 50 MG tablet; Take 1 tablet (50 mg total) by mouth at bedtime.     Please see After Visit Summary for patient specific instructions.  Future Appointments  Date Time Provider Department Center  03/24/2023 10:00 AM Mathis Fare, LCSW [facility] None  04/08/2023 10:00 AM Mathis Fare, LCSW [facility] None  06/02/2023  9:00 AM Kuneff, Renee A, DO [id number]    No orders of the defined types were placed in this encounter.   -------------------------------

## 2023-03-19 ENCOUNTER — Telehealth: Payer: Self-pay | Admitting: Internal Medicine

## 2023-03-19 DIAGNOSIS — Z1211 Encounter for screening for malignant neoplasm of colon: Secondary | ICD-10-CM

## 2023-03-19 NOTE — Telephone Encounter (Signed)
Please assist [name] scheduling a direct colonoscopy for screening.  I talked to her up in the [facility] about it this week.  Thanks.

## 2023-03-20 NOTE — Telephone Encounter (Signed)
[name] set up her colonoscopy for 05/07/2023 at 1:30pm. She said she is a 2 day prep. I will talk with Dr Leone Payor and see what type of prep he wants her to use. She is aware it may be several weeks before I get her the instructions as Dr Leone Payor is away from the office. I will place the amb referral today.

## 2023-03-20 NOTE — Telephone Encounter (Signed)
I spoke with [name] and she was driving. She will call me back or come down and see me when she is working next week to set up her colonoscopy appointment.

## 2023-03-24 ENCOUNTER — Ambulatory Visit (INDEPENDENT_AMBULATORY_CARE_PROVIDER_SITE_OTHER): Payer: Commercial Managed Care - PPO | Admitting: Psychiatry

## 2023-03-24 DIAGNOSIS — F411 Generalized anxiety disorder: Secondary | ICD-10-CM

## 2023-03-24 NOTE — Progress Notes (Signed)
[facility] Counselor/Therapist Progress Note  Patient ID: [name], MRN: [id number],    Date: 03/24/2023  Time Spent: 55 minutes  Treatment Type: Individual Therapy  Reported Symptoms: "anxiety is my stronger symptom", some depression, anger decreased some   Mental Status Exam:  Appearance:   Casual     Behavior:  Appropriate, Sharing, and Motivated  Motor:  Normal  Speech/Language:   Clear and Coherent  Affect:  Anxious, some depression  Mood:  anxious and depressed  Thought process:  goal directed  Thought content:    WNL  Sensory/Perceptual disturbances:    WNL  Orientation:  oriented to person, place, time/date, situation, day of week, month of year, year, and stated date of Sept. 10, 2024  Attention:  Good  Concentration:  Good  Memory:  WNL  Fund of knowledge:   Good  Insight:    Good  Judgment:   Good  Impulse Control:  Good   Risk Assessment: Danger to Self:  No Self-injurious Behavior: No Danger to Others: No Duty to Warn:no Physical Aggression / Violence:No  Access to Firearms a concern: No  Gang Involvement:No   Subjective:   Patient in session today reporting anxiety, some depression, good self-control, but frustrated. Some apathy noted. Difficulty in relationship with aging mother (5 yrs old), work issues, and worries about money although admits that she is "well covered " for the future. Worked on increased stressors at work and some of the apprehension she is feeling. Vented concerns related to her health, family stressors, her job and all its stressors. Looking at what she can change and what she cannot change. Noted progress in her anger, sleep "overall, is better", marital communication is improving some gradually which is a significant improvement.  Showing more effort and working on her anger, while continuing to use healthier boundaries.  Self-talk is also improved.  To continue working with self calming skills, better stress management,  and conflict resolution skills which seem to be helping her anxiety level to be lower.  Interventions: Cognitive Behavioral Therapy and Ego-Supportive  Long term goal: Develop healthy cognitive patterns and beliefs about self and the world that lead to alleviation of depression and anxiety and help prevent relapse.  Progressing: Patient is motivated. Struggling to focus some due to immediate, unexpected health concern with husband.  Short term goal: Learn and implement personal skills for managing stress, solving daily problems, and resolving conflicts effectively. Strategy: Teach patient calming skills, problem solving skills, and conflict resolution skills, to better manage daily stressors  Diagnosis:   ICD-10-CM   1. Generalized anxiety disorder  F41.1      Plan:  Patient in today noting several improvements especially with her anxiety and depression, and some family relationships, all of which impact patient's overall outlook which is definitely a little more positive today.  She is showing progress and needs to continue her work with goal-directed behaviors in order to keep moving and a positive direction. Patient is showing progress even in the midst of challenges, and needs to continue her working with goal-directed behaviors to move in a hopeful, forward direction. Reminded and encouraged patient to be practicing more positive and self affirming behaviors as noted in session including: Making healthier sleep patterns a priority each night, taking her medications as prescribed, exercising on her bike to help deal with stress, taking breaks as needed throughout the day to experience more calmness, deep breathing exercises, setting limits with plan for her stressful for her,  stay in touch with supportive people, working on letting go of negativity and over worrying in order to see more positive options for herself, healthy boundaries with others, and realize the strength she shows working with  goal-directed behaviors to move in a direction that supports her improved emotional health and overall wellbeing.  Goal review and progress/challenges noted with patient.  Next appointment within 3 weeks.   Mathis Fare, LCSW

## 2023-03-26 DIAGNOSIS — M810 Age-related osteoporosis without current pathological fracture: Secondary | ICD-10-CM | POA: Diagnosis not present

## 2023-03-28 ENCOUNTER — Ambulatory Visit (INDEPENDENT_AMBULATORY_CARE_PROVIDER_SITE_OTHER): Payer: Commercial Managed Care - PPO

## 2023-03-28 ENCOUNTER — Encounter (HOSPITAL_COMMUNITY): Payer: Self-pay | Admitting: Emergency Medicine

## 2023-03-28 ENCOUNTER — Ambulatory Visit (HOSPITAL_COMMUNITY)
Admission: EM | Admit: 2023-03-28 | Discharge: 2023-03-28 | Disposition: A | Discharge status: Home / Self Care | Payer: Commercial Managed Care - PPO | Attending: Family Medicine | Admitting: Family Medicine

## 2023-03-28 DIAGNOSIS — M25572 Pain in left ankle and joints of left foot: Secondary | ICD-10-CM | POA: Diagnosis not present

## 2023-03-28 DIAGNOSIS — Z041 Encounter for examination and observation following transport accident: Secondary | ICD-10-CM | POA: Diagnosis not present

## 2023-03-28 DIAGNOSIS — M25532 Pain in left wrist: Secondary | ICD-10-CM

## 2023-03-28 DIAGNOSIS — M25472 Effusion, left ankle: Secondary | ICD-10-CM

## 2023-03-28 DIAGNOSIS — M7732 Calcaneal spur, left foot: Secondary | ICD-10-CM | POA: Diagnosis not present

## 2023-03-28 DIAGNOSIS — I10 Essential (primary) hypertension: Secondary | ICD-10-CM | POA: Diagnosis not present

## 2023-03-28 DIAGNOSIS — M25561 Pain in right knee: Secondary | ICD-10-CM | POA: Diagnosis not present

## 2023-03-28 DIAGNOSIS — M19032 Primary osteoarthritis, left wrist: Secondary | ICD-10-CM | POA: Diagnosis not present

## 2023-03-28 DIAGNOSIS — M19072 Primary osteoarthritis, left ankle and foot: Secondary | ICD-10-CM | POA: Diagnosis not present

## 2023-03-28 MED ORDER — IBUPROFEN 400 MG PO TABS: 400.0000 mg | ORAL_TABLET | Freq: Three times a day (TID) | ORAL | 0 refills | Status: DC | PRN
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
## 2023-03-28 NOTE — Discharge Instructions (Addendum)
It was nice seeing you. All your xrays are negative for fracture. We will have you on crutches for a few days to take the weight of your left ankle. Allow time to heal. If the numbness persists, please see your PCP for neurology referral or go to the ED. I sent in Ibuprofen prn pain to your pharmacy. Stay well

## 2023-03-29 NOTE — Telephone Encounter (Signed)
Please use a MiraLax and Suprep double prep

## 2023-03-30 ENCOUNTER — Other Ambulatory Visit (HOSPITAL_COMMUNITY): Payer: Self-pay

## 2023-03-30 MED ORDER — NA SULFATE-K SULFATE-MG SULF 17.5-3.13-1.6 GM/177ML PO SOLN
1.0000 | Freq: Once | ORAL | 0 refills | Status: AC
  Filled 2023-03-30: qty 354, 2d supply, fill #0

## 2023-03-30 NOTE — Telephone Encounter (Signed)
New instructions completed for double prep. Sent in [place].   Will send patient a mychart message to let her know.

## 2023-03-31 ENCOUNTER — Other Ambulatory Visit (HOSPITAL_COMMUNITY): Payer: Self-pay

## 2023-04-03 ENCOUNTER — Ambulatory Visit: Payer: Commercial Managed Care - PPO | Admitting: Family Medicine

## 2023-04-03 ENCOUNTER — Telehealth: Payer: Self-pay

## 2023-04-03 ENCOUNTER — Other Ambulatory Visit: Payer: Self-pay

## 2023-04-03 ENCOUNTER — Encounter: Payer: Self-pay | Admitting: Family Medicine

## 2023-04-03 ENCOUNTER — Other Ambulatory Visit (HOSPITAL_COMMUNITY): Payer: Self-pay

## 2023-04-03 VITALS — BP 128/70 | HR 68 | Temp 97.8°F | Wt 117.0 lb

## 2023-04-03 DIAGNOSIS — R232 Flushing: Secondary | ICD-10-CM | POA: Diagnosis not present

## 2023-04-03 DIAGNOSIS — I10 Essential (primary) hypertension: Secondary | ICD-10-CM | POA: Diagnosis not present

## 2023-04-03 LAB — CBC WITH DIFFERENTIAL/PLATELET
Basophils Absolute: 0.1 10*3/uL (ref 0.0–0.1)
Basophils Relative: 1.1 % (ref 0.0–3.0)
Eosinophils Absolute: 0.3 10*3/uL (ref 0.0–0.7)
Eosinophils Relative: 5.7 % — ABNORMAL HIGH (ref 0.0–5.0)
HCT: 43.7 % (ref 36.0–46.0)
Hemoglobin: 14.1 g/dL (ref 12.0–15.0)
Lymphocytes Relative: 29.8 % (ref 12.0–46.0)
Lymphs Abs: 1.4 10*3/uL (ref 0.7–4.0)
MCHC: 32.2 g/dL (ref 30.0–36.0)
MCV: 93 fl (ref 78.0–100.0)
Monocytes Absolute: 0.4 10*3/uL (ref 0.1–1.0)
Monocytes Relative: 9.4 % (ref 3.0–12.0)
Neutro Abs: 2.4 10*3/uL (ref 1.4–7.7)
Neutrophils Relative %: 54 % (ref 43.0–77.0)
Platelets: 204 10*3/uL (ref 150.0–400.0)
RBC: 4.7 Mil/uL (ref 3.87–5.11)
RDW: 13.8 % (ref 11.5–15.5)
WBC: 4.5 10*3/uL (ref 4.0–10.5)

## 2023-04-03 LAB — BASIC METABOLIC PANEL
BUN: 19 mg/dL (ref 6–23)
CO2: 28 mEq/L (ref 19–32)
Calcium: 10 mg/dL (ref 8.4–10.5)
Chloride: 103 mEq/L (ref 96–112)
Creatinine, Ser: 1 mg/dL (ref 0.40–1.20)
GFR: 58.65 mL/min — ABNORMAL LOW (ref >60.00)
Glucose, Bld: 78 mg/dL (ref 70–99)
Potassium: 4.3 mEq/L (ref 3.5–5.1)
Sodium: 139 mEq/L (ref 135–145)

## 2023-04-03 LAB — HEPATIC FUNCTION PANEL
ALT: 20 U/L (ref 0–35)
AST: 27 U/L (ref 0–37)
Albumin: 4.4 g/dL (ref 3.5–5.2)
Alkaline Phosphatase: 99 U/L (ref 39–117)
Bilirubin, Direct: 0.1 mg/dL (ref 0.0–0.3)
Total Bilirubin: 0.4 mg/dL (ref 0.2–1.2)
Total Protein: 7.1 g/dL (ref 6.0–8.3)

## 2023-04-03 LAB — TSH: TSH: 3.07 u[IU]/mL (ref 0.35–5.50)

## 2023-04-03 MED ORDER — AMLODIPINE BESYLATE 2.5 MG PO TABS
2.5000 mg | ORAL_TABLET | Freq: Every day | ORAL | 0 refills | Status: DC
  Filled 2023-04-03: qty 90, 90d supply, fill #0

## 2023-04-03 NOTE — Patient Instructions (Signed)
Schedule a blood pressure follow up w/ Dr Claiborne Billings in 4 weeks We'll notify you of your lab results and make any changes if needed RESTART the Amlodipine 2.5mg  daily to improve blood pressure Make sure you are drinking plenty of water Call your psychiatrist and let them know about the hot flashes and that they started around the same time that you restarted the Seroquel A low carb diet can help w/ hot flashes Call with any questions or concerns Hang in there!!

## 2023-04-03 NOTE — Progress Notes (Unsigned)
Subjective:    Patient ID: [name], female    DOB: 07-28-1955, 67 y.o.   MRN: [id number]  HPI Hot flashes- pt reports sxs have been occurring for the last 4 months but have been worsening.  Occurring daily- if not multiple times/day.  Restarted Seroquel in April.  Denies changes to supplements or OTC meds.  Doesn't take hormones.  No relation to food.    HTN- pt reports BP is 'up and down'.  Says she can feel her BP rise.  Causes a 'pounding' in her head, but denies HA.  Vision seems less clear.  Does not typically check BP when she feels it rising- highest she can remember is 164/98.  Was previously on Amlodipine for Raynaud's.  Currently on Atenolol 25mg  daily.  Pt feels sxs are occurring 3-4x/week.  Sxs started ~4 months ago.   Review of Systems For ROS see HPI     Objective:   Physical Exam Vitals reviewed.  Constitutional:      General: She is not in acute distress.    Appearance: Normal appearance. She is well-developed. She is not ill-appearing.  HENT:     Head: Normocephalic and atraumatic.  Eyes:     Conjunctiva/sclera: Conjunctivae normal.     Pupils: Pupils are equal, round, and reactive to light.  Neck:     Thyroid: No thyromegaly.  Cardiovascular:     Rate and Rhythm: Normal rate and regular rhythm.     Heart sounds: Normal heart sounds. No murmur heard. Pulmonary:     Effort: Pulmonary effort is normal. No respiratory distress.     Breath sounds: Normal breath sounds.  Musculoskeletal:     Cervical back: Normal range of motion and neck supple.  Lymphadenopathy:     Cervical: No cervical adenopathy.  Skin:    General: Skin is warm and dry.  Neurological:     General: No focal deficit present.     Mental Status: She is alert and oriented to person, place, and time.  Psychiatric:        Mood and Affect: Mood normal.        Behavior: Behavior normal.           Assessment & Plan:

## 2023-04-03 NOTE — Telephone Encounter (Addendum)
Left detailed voice message per DPR on file for patient informing her labs look great and that the same information was sent to her MyChart and to give our office a call if she has any questions.   ----- Message from Neena Rhymes sent at 04/03/2023  3:26 PM EDT ----- Labs look great!  No obvious cause for hot flashes and everything looks good

## 2023-04-05 NOTE — Assessment & Plan Note (Signed)
Deteriorated.  Pt reports she has been having sxs for ~4 months but they have been worsening.  Of note, she restarted her Seroquel around the same time the hot flashes returned.  At age 67 she should be well past menopausal sxs.  Will check labs to assess thyroid and electrolytes, but suspect this is due to medication.  She was advised to reach out to psych to discuss adjusting Seroquel.  Pt expressed understanding and is in agreement w/ plan.

## 2023-04-05 NOTE — Assessment & Plan Note (Signed)
Deteriorated.  Pt reports when spot checking her BP it has been elevated.  She is currently on Atenolol 25mg  daily, was previously on Amlodipine 2.5mg  daily which she states was for 'cold feet'.  States she can feel when BP is elevated b/c head will start 'pounding'.  Denies HA.  Will restart Amlodipine 2.5mg  daily and she will f/u w/ PCP to recheck BP.  Pt expressed understanding and is in agreement w/ plan.

## 2023-04-07 ENCOUNTER — Other Ambulatory Visit: Payer: Self-pay

## 2023-04-08 ENCOUNTER — Ambulatory Visit: Payer: Commercial Managed Care - PPO | Admitting: Psychiatry

## 2023-04-09 ENCOUNTER — Ambulatory Visit: Payer: Commercial Managed Care - PPO | Admitting: Adult Health

## 2023-04-10 ENCOUNTER — Telehealth: Payer: Self-pay

## 2023-04-10 DIAGNOSIS — Z1211 Encounter for screening for malignant neoplasm of colon: Secondary | ICD-10-CM

## 2023-04-10 NOTE — Telephone Encounter (Signed)
Have her do MiraLAX and Suprep

## 2023-04-10 NOTE — Telephone Encounter (Signed)
Please advise on the prep for Irys. She thinks she needs 2 day prepping Sir. Her procedure is 05/07/2023 at 1:30pm.

## 2023-04-20 MED ORDER — NA SULFATE-K SULFATE-MG SULF 17.5-3.13-1.6 GM/177ML PO SOLN
1.0000 | Freq: Once | ORAL | 0 refills | Status: AC
  Filled 2023-04-20: qty 354, 1d supply, fill #0

## 2023-04-20 NOTE — Telephone Encounter (Signed)
I spoke to [name] and we will try to go over her colonoscopy instructions on 04/22/2023. Suprep sent and amb referral placed.

## 2023-04-21 ENCOUNTER — Other Ambulatory Visit: Payer: Self-pay

## 2023-04-21 ENCOUNTER — Ambulatory Visit: Payer: Commercial Managed Care - PPO | Admitting: Psychiatry

## 2023-04-21 DIAGNOSIS — F411 Generalized anxiety disorder: Secondary | ICD-10-CM

## 2023-04-21 NOTE — Progress Notes (Signed)
[facility] Counselor/Therapist Progress Note  Patient ID: [name], MRN: [id number],    Date: 04/21/2023  Time Spent: 53 minutes   Treatment Type: Individual Therapy  Reported Symptoms: anxiety, anger, depression  Mental Status Exam:  Appearance:   Casual and Neat     Behavior:  Appropriate and Motivated  Motor:  Normal  Speech/Language:   Clear and Coherent  Affect:  Depressed and anxious, angry  Mood:  angry, anxious, and depressed  Thought process:  goal directed  Thought content:    WNL  Sensory/Perceptual disturbances:    WNL  Orientation:  oriented to person, place, time/date, situation, day of week, month of year, year, and stated date of Oct. 8, 2024  Attention:  Good  Concentration:  Fair  Memory:  WNL  Fund of knowledge:   Good  Insight:    Good  Judgment:   Good  Impulse Control:  Good   Risk Assessment: Danger to Self:  No Self-injurious Behavior: No Danger to Others: No Duty to Warn:no Physical Aggression / Violence:No  Access to Firearms a concern: No  Gang Involvement:No   Subjective:  Patient in for session today and reporting anxiety, depression, anger, and frustrated. Symptoms related to job, marriage, money, and elderly mom. Has had more health concerns recently and starting to look more into her future and what will be best for her. Processed more issues today related to self-care, self-talk, decision-making, anger!, frustration, anxiety, and depression. (Not all details included in this note due to patient privacy needs.) Looking at some upgrades she need in her own physical and emotional health care. Venting work frustrations and looking more at how she might be able to shift some things that could be helpful to her. Anger and frustration at mother shared and processed, looking at ways patient can refrain from letting mother "control and manipulate her". Is looking into the future, what she can change, and weighing her options. Sleep was  better but has decreased more recently and will check with med provider. Marital situation with some stressors. Looking at "big" decision and what may be best for patient, moving forward.  Urged to continue her use of healthy boundaries, better stress management, and more positive self talk and self calming skills as we worked on today in session.  Interventions: Cognitive Behavioral Therapy and Ego-Supportive  Long term goal: Develop healthy cognitive patterns and beliefs about self and the world that lead to alleviation of depression and anxiety and help prevent relapse.  Progressing: Patient is motivated. Struggling to focus some due to immediate, unexpected health concern with husband.  Short term goal: Learn and implement personal skills for managing stress, solving daily problems, and resolving conflicts effectively. Strategy: Teach patient calming skills, problem solving skills, and conflict resolution skills, to better manage daily stressors   Diagnosis:   ICD-10-CM   1. Generalized anxiety disorder  F41.1      Plan:  Patient in for session today reporting symptoms of depression, anxiety, frustration, and anger.  She has made progress and needs to continue working with goal-directed behaviors in order to keep progressing and move in a more hopeful direction. Reminded and encouraged patient in her practice of more positive and self affirming behaviors as noted in session including: Making healthier sleep patterns a priority each night, taking her medications as prescribed, exercising on her bike to help deal with stress, taking breaks as needed throughout the day to experience more calmness, deep breathing exercises, setting limits  to better manage stress, stay in touch with supportive people, work on letting go of negativity and over worrying in order to see more positive options for herself, healthy boundaries with others, and recognize the strength she shows working with goal-directed  behaviors to move in a direction that supports her improved emotional health and overall wellbeing.  Goal review and progress/challenges noted with patient.  Next appointment within 3 weeks.   Mathis Fare, LCSW

## 2023-04-22 ENCOUNTER — Ambulatory Visit: Payer: Self-pay

## 2023-04-22 ENCOUNTER — Other Ambulatory Visit: Payer: Self-pay | Admitting: Nurse Practitioner

## 2023-04-22 DIAGNOSIS — S6722XA Crushing injury of left hand, initial encounter: Secondary | ICD-10-CM

## 2023-04-22 NOTE — Telephone Encounter (Signed)
Patient given instructions and consent signed.

## 2023-04-23 ENCOUNTER — Other Ambulatory Visit: Payer: Self-pay | Admitting: Family Medicine

## 2023-04-23 ENCOUNTER — Telehealth: Payer: Self-pay | Admitting: Family Medicine

## 2023-04-23 DIAGNOSIS — M1811 Unilateral primary osteoarthritis of first carpometacarpal joint, right hand: Secondary | ICD-10-CM

## 2023-04-23 NOTE — Telephone Encounter (Signed)
Placed referral. Sent patient MyChart message

## 2023-04-23 NOTE — Telephone Encounter (Signed)
Patient called asking if Dr Katrinka Blazing could refer her to [facility]? She said that based on the worsening arthritis she would like to go to rheumatology to see what other options there are.

## 2023-04-24 ENCOUNTER — Encounter: Payer: Self-pay | Admitting: Internal Medicine

## 2023-05-01 ENCOUNTER — Encounter: Payer: Self-pay | Admitting: Adult Health

## 2023-05-01 ENCOUNTER — Other Ambulatory Visit (HOSPITAL_COMMUNITY): Payer: Self-pay

## 2023-05-01 ENCOUNTER — Ambulatory Visit: Payer: Commercial Managed Care - PPO | Admitting: Adult Health

## 2023-05-01 DIAGNOSIS — F411 Generalized anxiety disorder: Secondary | ICD-10-CM | POA: Diagnosis not present

## 2023-05-01 DIAGNOSIS — G4700 Insomnia, unspecified: Secondary | ICD-10-CM

## 2023-05-01 DIAGNOSIS — F4310 Post-traumatic stress disorder, unspecified: Secondary | ICD-10-CM | POA: Diagnosis not present

## 2023-05-01 DIAGNOSIS — F331 Major depressive disorder, recurrent, moderate: Secondary | ICD-10-CM

## 2023-05-01 MED ORDER — LAMOTRIGINE 25 MG PO TABS
50.0000 mg | ORAL_TABLET | Freq: Every day | ORAL | 2 refills | Status: DC
Start: 2023-05-01 — End: 2024-03-09
  Filled 2023-05-01: qty 60, 30d supply, fill #0
  Filled 2023-06-22: qty 60, 30d supply, fill #1

## 2023-05-01 NOTE — Progress Notes (Signed)
[name] [id number] 02-08-1956 67 y.o.  Subjective:   Patient ID:  [name] is a 67 y.o. (DOB 07/16/1955) female.  Chief Complaint: No chief complaint on file.   HPI [name] presents to the office today for follow-up of MDD, GAD, panic disorder, insomnia and PTSD.  Describes mood today as "not too good". Pleasant. Reports tearfulness. Mood symptoms - reports anxiety, depression, and irritability. Denies panic attacks. Reports worry, rumination, and over thinking. Mood is lower. Stating "I'm not feeling good". Feels like medications are helpful, but is not feeling well. Varying interest and motivation. Seeing therapist - Rockne Menghini. Taking medications as prescribed.  Energy levels stable. Active, has a regular exercise routine - biking 60 to 80 miles a week. Enjoys some usual interests and activities. Married. Lives with husband of 16 years and 5 dogs. Mother lives in [place]. Spending time with family. Appetite adequate. Weight stable - 116 pounds. Sleeps has improved. Averages 6 hours. Focus and concentration improved. Completing tasks. Managing aspects of household. 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  Denies SI or HI.  Denies AH or VH. Denies self harm. Denies substance use.  Previous medication trials: Remeron, Trazadone, Restoril, Xanax, Seroquel, Belsomra    GAD-7    Flowsheet Row Office Visit from 04/03/2023 in [facility] Visit from 05/29/2022 in [facility] Visit from 01/01/2022 in [facility] Office Visit from 04[facility]27/2019 in [facility] Office Visit from 12/29/2018 in [facility]  Total GAD-7 Score 17 11 7 11 18       Mini-Mental    Flowsheet Row Office Visit from 12/22/2016 in [facility]  Total Score (max 30 points ) 30       PHQ2-9    Flowsheet Row Office Visit from 04/03/2023 in [facility] Visit from 09/08/2022 in [facility] Visit from 05/29/2022 in [facility] [place] Office Visit from 01/01/2022 in [facility] [place] Office Visit from 05/27/2021 in [facility] [place]  PHQ-2 Total Score 2 1 0 0 0  PHQ-9 Total Score 8 6 3 1  --      Flowsheet Row ED from 02/26/2023 in [facility]  C-SSRS RISK CATEGORY No Risk        Review of Systems:  Review of Systems  Musculoskeletal:  Negative for gait problem.  Neurological:  Negative for tremors.  Psychiatric/Behavioral:         Please refer to HPI    Medications: I have reviewed the patient's current medications.  Current Outpatient Medications  Medication Sig Dispense Refill   albuterol (VENTOLIN HFA) 108 (90 Base) MCG/ACT inhaler Inhale 2 puffs 15 minutes before exercise and as needed for wheeze. 6.7 g 5   ALPRAZolam (XANAX) 0.5 MG tablet Take 1 tablet (0.5 mg total) by mouth 3 (three) times daily as needed for anxiety. 90 tablet 2   amLODipine (NORVASC) 2.5 MG tablet Take 1 tablet (2.5 mg total) by mouth daily. 90 tablet 0   atenolol (TENORMIN) 25 MG tablet Take 1 tablet (25 mg total) by mouth daily. 90 tablet 3   benzonatate (TESSALON) 200 MG capsule Take 1 capsule (200 mg total)  by mouth 2 (two) times daily as needed for cough. 20 capsule 0   cholecalciferol (VITAMIN D) 1000 UNITS tablet Take 1,000 Units by mouth every evening.     cyanocobalamin (VITAMIN B12) 1000 MCG/ML injection Inject 1 mL (1,000 mcg total) into the muscle every 14 (fourteen) days. 6 mL 3   doxycycline (VIBRA-TABS) 100 MG tablet Take 1 tablet (100 mg total) by mouth 2 (two) times daily. 20 tablet 0   fluticasone (FLONASE) 50 MCG/ACT nasal spray Place 2 sprays into the  nose daily. 16 g 5   hydrocortisone (ANUSOL-HC) 2.5 % rectal cream Insert rectally 2 times daily for 10 days 30 g 1   ibuprofen (ADVIL) 400 MG tablet Take 1 tablet (400 mg total) by mouth every 8 (eight) hours as needed for moderate pain. 30 tablet 0   ketoconazole (NIZORAL) 2 % shampoo Use topically as directed for 30 days 120 mL 5   lamoTRIgine (LAMICTAL) 200 MG tablet Take 1 tablet (200 mg total) by mouth at bedtime. 90 tablet 3   meloxicam (MOBIC) 15 MG tablet Take 1 tablet (15 mg total) by mouth daily. 90 tablet 0   omeprazole (PRILOSEC) 40 MG capsule Take 1 capsule (40 mg total) by mouth daily. 90 capsule 3   QUEtiapine (SEROQUEL) 50 MG tablet Take 1 tablet (50 mg total) by mouth at bedtime. 90 tablet 1   SYRINGE-NEEDLE, DISP, 3 ML (B-D 3CC LUER-LOK SYR 25GX1") 25G X 1" 3 ML MISC Use as directed to inject b-12 into the skin 6 each 3   No current facility-administered medications for this visit.    Medication Side Effects: None  Allergies:  Allergies  Allergen Reactions   Aspartame And Phenylalanine Other (See Comments)    Migraines   Beef-Derived Products Other (See Comments)    severe cramping   Citrus Other (See Comments)    Causes Interstitial cystitis flares   Penicillins Hives and Rash    Denies airway involvement Has patient had a PCN reaction causing immediate rash, facial/tongue/throat swelling, SOB or lightheadedness with hypotension: yes hives.  Has patient had a PCN reaction causing severe rash involving mucus membranes or skin necrosis:no Has patient had a PCN reaction that required hospitalization No Has patient had a PCN reaction occurring within the last 10 years: No If all of the above answers are "NO", then may proceed with Cephalosporin use.    Promethazine Hcl Other (See Comments)    Muscle twitching and contracture    Beta Vulgaris Other (See Comments)    beets   Adhesive [Tape] Other (See Comments)    Red splotches   Morphine Sulfate Nausea And  Vomiting    Past Medical History:  Diagnosis Date   Allergy    Anemia    after birth of child 19yrs ago   Anxiety and depression    with insomnia   Autoimmune disease (HCC) 08/06/2020   Blood transfusion without reported diagnosis    x3   Chronic back pain    ? RA, ? Lupus - reports saw rheumatologist in the past but better now, sees Dr. Dutch Quint   Eczema    Flushing 05/27/2021   GERD 02/13/2007   Qualifier: Diagnosis of  By: Jonny Ruiz MD, Len Blalock    GERD (gastroesophageal reflux disease)    takes Omeprazole daily, with nausea   Greater trochanteric bursitis of left hip 03/25/2019   Injected March 25, 2019   Heart murmur    Hemorrhoids    History of kidney stones  passed on her own   History of migraine    last one a yr ago and takes Zomig prn   Hot flashes 11/10/2014   HTN (hypertension) 03/31/2012   Hx of echocardiogram    Echo (8/15):  EF 55-60%, no RWMA   IBS (irritable bowel syndrome)    constipation predominant   Interstitial cystitis    hx of UTI   Irritable bowel syndrome 12/14/2009   Qualifier: Diagnosis of  By: Leone Payor MD, Charlyne Quale    Memory loss 12/22/2016   Numbness    both legs and related to back   Osteoporosis    osteopenia   Palpitations    on metoprolol in past but made BP too low   Pneumonia    hx of;last time 69yrs ago   PONV (postoperative nausea and vomiting)    laryngospasm-1999   Poor vision 10/15/2015   -? Ocular rosacea -sees opthomology    Raynaud's disease 10/21/2012   Raynaud's disease /phenomenon 10/21/2012   no meds, worse in the winter or cold environment.    Rheumatoid aortitis    uses Diclofenac gel;Rheumatoid   Seasonal allergies    Flonase daily    Strain of left piriformis muscle 05/18/2019   Urinary incontinence     Past Medical History, Surgical history, Social history, and Family history were reviewed and updated as appropriate.   Please see review of systems for further details on the patient's review from  today.   Objective:   Physical Exam:  There were no vitals taken for this visit.  Physical Exam Constitutional:      General: She is not in acute distress. Musculoskeletal:        General: No deformity.  Neurological:     Mental Status: She is alert and oriented to person, place, and time.     Coordination: Coordination normal.  Psychiatric:        Attention and Perception: Attention and perception normal. She does not perceive auditory or visual hallucinations.        Mood and Affect: Affect is not labile, blunt, angry or inappropriate.        Speech: Speech normal.        Behavior: Behavior normal.        Thought Content: Thought content normal. Thought content is not paranoid or delusional. Thought content does not include homicidal or suicidal ideation. Thought content does not include homicidal or suicidal plan.        Cognition and Memory: Cognition and memory normal.        Judgment: Judgment normal.     Comments: Insight intact     Lab Review:     Component Value Date/Time   NA 139 04/03/2023 0950   NA 143 12/22/2016 0948   K 4.3 04/03/2023 0950   CL 103 04/03/2023 0950   CO2 28 04/03/2023 0950   GLUCOSE 78 04/03/2023 0950   GLUCOSE 95 04/21/2006 1635   BUN 19 04/03/2023 0950   BUN 17 12/22/2016 0948   CREATININE 1.00 04/03/2023 0950   CALCIUM 10.0 04/03/2023 0950   PROT 7.1 04/03/2023 0950   PROT 7.0 12/22/2016 0948   ALBUMIN 4.4 04/03/2023 0950   ALBUMIN 4.7 12/22/2016 0948   AST 27 04/03/2023 0950   ALT 20 04/03/2023 0950   ALKPHOS 99 04/03/2023 0950   BILITOT 0.4 04/03/2023 0950   BILITOT 0.3 12/22/2016 0948   GFRNONAA 65 12/22/2016 0948   GFRAA 75 12/22/2016 0948  Component Value Date/Time   WBC 4.5 04/03/2023 0950   RBC 4.70 04/03/2023 0950   HGB 14.1 04/03/2023 0950   HGB 14.1 12/22/2016 0948   HCT 43.7 04/03/2023 0950   HCT 44.0 12/22/2016 0948   PLT 204.0 04/03/2023 0950   PLT 206 12/22/2016 0948   MCV 93.0 04/03/2023 0950   MCV  94 12/22/2016 0948   MCH 30.3 12/22/2016 0948   MCH 30.7 11/01/2015 1154   MCHC 32.2 04/03/2023 0950   RDW 13.8 04/03/2023 0950   RDW 14.3 12/22/2016 0948   LYMPHSABS 1.4 04/03/2023 0950   LYMPHSABS 0.9 12/22/2016 0948   MONOABS 0.4 04/03/2023 0950   EOSABS 0.3 04/03/2023 0950   EOSABS 0.1 12/22/2016 0948   BASOSABS 0.1 04/03/2023 0950   BASOSABS 0.0 12/22/2016 0948    No results found for: "POCLITH", "LITHIUM"   No results found for: "PHENYTOIN", "PHENOBARB", "VALPROATE", "CBMZ"   .res Assessment: Plan:    Plan:  PDMP reviewed  Seroquel 50mg  at bedtime  Xanax 0.5mg  TID - decreased use Lamictal 200mg  daily  Add Lamictal 25mg  x 7 days, then increase to 50mg  daily.  Patient to request FMLA paperwork  RTC 3 months  Patient advised to contact office with any questions, adverse effects, or acute worsening in signs and symptoms.  Discussed potential benefits, risk, and side effects of benzodiazepines to include potential risk of tolerance and dependence, as well as possible drowsiness.  Advised patient not to drive if experiencing drowsiness and to take lowest possible effective dose to minimize risk of dependence and tolerance.  There are no diagnoses linked to this encounter.   Please see After Visit Summary for patient specific instructions.  Future Appointments  Date Time Provider Department Center  05/01/2023  4:00 PM Mariaceleste Herrera, Thereasa Solo, NP [facility] None  05/07/2023  1:30 PM Iva Boop, MD [facility]  05/11/2023 10:00 AM Mathis Fare, LCSW [facility] None  06/01/2023 11:00 AM Mathis Fare, LCSW [facility] None  06/02/2023  9:00 AM Claiborne Billings, Renee A, DO [facility]  06/16/2023 10:20 AM Rafiel Mecca, Thereasa Solo, NP [facility] None    No orders of the defined types were placed in this encounter.   -------------------------------

## 2023-05-03 ENCOUNTER — Encounter: Payer: Self-pay | Admitting: Certified Registered Nurse Anesthetist

## 2023-05-05 ENCOUNTER — Telehealth: Payer: Self-pay | Admitting: Adult Health

## 2023-05-05 NOTE — Telephone Encounter (Signed)
[name] called at 1:40 to report that FMLA forms will be faxed today.  She has appt Monday, 10/28 to discuss this with you.

## 2023-05-05 NOTE — Telephone Encounter (Signed)
Just FYI.

## 2023-05-07 ENCOUNTER — Encounter: Payer: Self-pay | Admitting: Internal Medicine

## 2023-05-07 ENCOUNTER — Ambulatory Visit: Payer: Commercial Managed Care - PPO | Admitting: Internal Medicine

## 2023-05-07 VITALS — BP 128/48 | HR 62 | Temp 97.8°F | Resp 16 | Ht 62.0 in | Wt 117.0 lb

## 2023-05-07 DIAGNOSIS — Z1211 Encounter for screening for malignant neoplasm of colon: Secondary | ICD-10-CM | POA: Diagnosis not present

## 2023-05-07 DIAGNOSIS — F418 Other specified anxiety disorders: Secondary | ICD-10-CM | POA: Diagnosis not present

## 2023-05-07 DIAGNOSIS — I10 Essential (primary) hypertension: Secondary | ICD-10-CM | POA: Diagnosis not present

## 2023-05-07 MED ORDER — SODIUM CHLORIDE 0.9 % IV SOLN: 500.0000 mL | INTRAVENOUS | Status: DC

## 2023-05-07 NOTE — Op Note (Signed)
[facility] Patient Name: [name] Procedure Date: 05/07/2023 1:32 PM MRN: [id number] Endoscopist: Iva Boop , MD, [id number] Age: 67 Referring MD:  Date of Birth: 12-15-1955 Gender: Female Account #: [id number] Procedure:                Colonoscopy Indications:              Screening for colorectal malignant neoplasm, Last                            colonoscopy: 2014 Medicines:                Monitored Anesthesia Care Procedure:                Pre-Anesthesia Assessment:                           - Prior to the procedure, a History and Physical                            was performed, and patient medications and                            allergies were reviewed. The patient's tolerance of                            previous anesthesia was also reviewed. The risks                            and benefits of the procedure and the sedation                            options and risks were discussed with the patient.                            All questions were answered, and informed consent                            was obtained. Prior Anticoagulants: The patient has                            taken no anticoagulant or antiplatelet agents. ASA                            Grade Assessment: II - A patient with mild systemic                            disease. After reviewing the risks and benefits,                            the patient was deemed in satisfactory condition to                            undergo the procedure.  After obtaining informed consent, the colonoscope                            was passed under direct vision. Throughout the                            procedure, the patient's blood pressure, pulse, and                            oxygen saturations were monitored continuously. The                            Olympus Scope [id number] was introduced through the                            anus and advanced to the the cecum,  identified by                            appendiceal orifice and ileocecal valve. The                            colonoscopy was somewhat difficult due to a                            redundant colon and significant looping. Successful                            completion of the procedure was aided by                            straightening and shortening the scope to obtain                            bowel loop reduction and applying abdominal                            pressure. The patient tolerated the procedure well.                            The quality of the bowel preparation was good. The                            ileocecal valve, appendiceal orifice, and rectum                            were photographed. The bowel preparation used was                            Miralax via extended prep with split dose                            instruction. The bowel preparation used was SUPREP  via extended prep with split dose instruction. Scope In: 1:37:58 PM Scope Out: 1:54:25 PM Scope Withdrawal Time: 0 hours 9 minutes 29 seconds  Total Procedure Duration: 0 hours 16 minutes 27 seconds  Findings:                 The perianal and digital rectal examinations were                            normal.                           Internal hemorrhoids were found.                           The exam was otherwise without abnormality on                            direct and retroflexion views. Complications:            No immediate complications. Estimated Blood Loss:     Estimated blood loss: none. Impression:               - Internal hemorrhoids.                           - The examination was otherwise normal on direct                            and retroflexion views.                           - No specimens collected. Recommendation:           - Patient has a contact number available for                            emergencies. The signs and symptoms of potential                             delayed complications were discussed with the                            patient. Return to normal activities tomorrow.                            Written discharge instructions were provided to the                            patient.                           - Resume previous diet.                           - Continue present medications.                           - No recommendation at this time regarding repeat  colonoscopy due to age and no polyps on today's                            exam. She will be 58 in 10 years - no recall placed                            she can decide then re: repeat screening Iva Boop, MD 05/07/2023 2:00:11 PM This report has been signed electronically.

## 2023-05-07 NOTE — Patient Instructions (Addendum)
No polyps or cancer seen. Hemorrhoids were a little swollen.  You can decide if you want to repeat one at 37 - not going to put a recall in.  I appreciate the opportunity to care for you. Iva Boop, MD, FACG   YOU HAD AN ENDOSCOPIC PROCEDURE TODAY AT THE [facility]:   Refer to the procedure report that was given to you for any specific questions about what was found during the examination.  If the procedure report does not answer your questions, please call your gastroenterologist to clarify.  If you requested that your care partner not be given the details of your procedure findings, then the procedure report has been included in a sealed envelope for you to review at your convenience later.  YOU SHOULD EXPECT: Some feelings of bloating in the abdomen. Passage of more gas than usual.  Walking can help get rid of the air that was put into your GI tract during the procedure and reduce the bloating. If you had a lower endoscopy (such as a colonoscopy or flexible sigmoidoscopy) you may notice spotting of blood in your stool or on the toilet paper. If you underwent a bowel prep for your procedure, you may not have a normal bowel movement for a few days.  Please Note:  You might notice some irritation and congestion in your nose or some drainage.  This is from the oxygen used during your procedure.  There is no need for concern and it should clear up in a day or so.  SYMPTOMS TO REPORT IMMEDIATELY:  Following lower endoscopy (colonoscopy or flexible sigmoidoscopy):  Excessive amounts of blood in the stool  Significant tenderness or worsening of abdominal pains  Swelling of the abdomen that is new, acute  Fever of 100F or higher  For urgent or emergent issues, a gastroenterologist can be reached at any hour by calling [phone]. Do not use MyChart messaging for urgent concerns.    DIET:  We do recommend a small meal at first, but then you may proceed to your regular  diet.  Drink plenty of fluids but you should avoid alcoholic beverages for 24 hours.  ACTIVITY:  You should plan to take it easy for the rest of today and you should NOT DRIVE or use heavy machinery until tomorrow (because of the sedation medicines used during the test).    FOLLOW UP: Our staff will call the number listed on your records the next business day following your procedure.  We will call around 7:15- 8:00 am to check on you and address any questions or concerns that you may have regarding the information given to you following your procedure. If we do not reach you, we will leave a message.     If any biopsies were taken you will be contacted by phone or by letter within the next 1-3 weeks.  Please call us at [phone] if you have not heard about the biopsies in 3 weeks.    SIGNATURES/CONFIDENTIALITY: You and/or your care partner have signed paperwork which will be entered into your electronic medical record.  These signatures attest to the fact that that the information above on your After Visit Summary has been reviewed and is understood.  Full responsibility of the confidentiality of this discharge information lies with you and/or your care-partner.

## 2023-05-07 NOTE — Progress Notes (Signed)
[facility] History and Physical   Primary Care Physician:  Natalia Leatherwood, DO   Reason for Procedure:   CRCA screen  Plan:    colonoscopy     HPI: [name] is a 67 y.o. female here for screening exam Last done 2014 - negative   Past Medical History:  Diagnosis Date   Allergy    Anemia    after birth of child 48yrs ago   Anxiety and depression    with insomnia   Autoimmune disease (HCC) 08/06/2020   Blood transfusion without reported diagnosis    x3   Chronic back pain    ? RA, ? Lupus - reports saw rheumatologist in the past but better now, sees Dr. Dutch Quint   Eczema    Flushing 05/27/2021   GERD 02/13/2007   Qualifier: Diagnosis of  By: Jonny Ruiz MD, Len Blalock    GERD (gastroesophageal reflux disease)    takes Omeprazole daily, with nausea   Greater trochanteric bursitis of left hip 03/25/2019   Injected March 25, 2019   Heart murmur    Hemorrhoids    History of kidney stones    passed on her own   History of migraine    last one a yr ago and takes Zomig prn   Hot flashes 11/10/2014   HTN (hypertension) 03/31/2012   Hx of echocardiogram    Echo (8/15):  EF 55-60%, no RWMA   IBS (irritable bowel syndrome)    constipation predominant   Interstitial cystitis    hx of UTI   Irritable bowel syndrome 12/14/2009   Qualifier: Diagnosis of  By: Leone Payor MD, Charlyne Quale    Memory loss 12/22/2016   Numbness    both legs and related to back   Osteoporosis    osteopenia   Palpitations    on metoprolol in past but made BP too low   Pneumonia    hx of;last time 6yrs ago   PONV (postoperative nausea and vomiting)    laryngospasm-1999   Poor vision 10/15/2015   -? Ocular rosacea -sees opthomology    Raynaud's disease 10/21/2012   Raynaud's disease /phenomenon 10/21/2012   no meds, worse in the winter or cold environment.    Rheumatoid aortitis    uses Diclofenac gel;Rheumatoid   Seasonal allergies    Flonase daily    Strain of left piriformis  muscle 05/18/2019   Urinary incontinence     Past Surgical History:  Procedure Laterality Date   ABDOMINAL HYSTERECTOMY  1997   BREAST BIOPSY  1999   benign   COLONOSCOPY  02/11/2013 and 12/24/04   internal and external hemorrhoids   ENDOMETRIAL ABLATION     EXCISION MORTON'S NEUROMA Right    FLEXIBLE SIGMOIDOSCOPY  03/07/2008   internal and external hemorrhoids   POSTERIOR LUMBAR FUSION  05/02/2013   L4-L5 fusion (cage/screws/bone graft) Dr. Jordan Likes   TONSILLECTOMY     UPPER GASTROINTESTINAL ENDOSCOPY  10/23/2010   gastritis, irregular Z-line   wisdom teeth extracted      Prior to Admission medications   Medication Sig Start Date End Date Taking? Authorizing Provider  ALPRAZolam Prudy Feeler) 0.5 MG tablet Take 1 tablet (0.5 mg total) by mouth 3 (three) times daily as needed for anxiety. 03/12/23  Yes Mozingo, Thereasa Solo, NP  amLODipine (NORVASC) 2.5 MG tablet Take 1 tablet (2.5 mg total) by mouth daily. 04/03/23  Yes Sheliah Hatch, MD  atenolol (TENORMIN) 25 MG tablet Take 1 tablet (25 mg total) by  mouth daily. 05/29/22  Yes Kuneff, Renee A, DO  cyanocobalamin (VITAMIN B12) 1000 MCG/ML injection Inject 1 mL (1,000 mcg total) into the muscle every 14 (fourteen) days. 05/29/22  Yes Kuneff, Renee A, DO  lamoTRIgine (LAMICTAL) 200 MG tablet Take 1 tablet (200 mg total) by mouth at bedtime. 03/12/23  Yes Mozingo, Thereasa Solo, NP  lamoTRIgine (LAMICTAL) 25 MG tablet Take 2 tablets (50 mg total) by mouth daily. 05/01/23  Yes Mozingo, Thereasa Solo, NP  QUEtiapine (SEROQUEL) 50 MG tablet Take 1 tablet (50 mg total) by mouth at bedtime. 03/12/23  Yes Mozingo, Thereasa Solo, NP  albuterol (VENTOLIN HFA) 108 (90 Base) MCG/ACT inhaler Inhale 2 puffs 15 minutes before exercise and as needed for wheeze. 09/08/22   Sheliah Hatch, MD  esomeprazole (NEXIUM) 40 MG capsule Take by mouth.    [provider]  hydrocortisone (ANUSOL-HC) 2.5 % rectal cream Insert rectally 2 times daily  for 10 days 12/05/21   Iva Boop, MD  ketoconazole (NIZORAL) 2 % shampoo Use topically as directed for 30 days 08/27/22     meloxicam (MOBIC) 15 MG tablet Take 1 tablet (15 mg total) by mouth daily. 12/02/22   Judi Saa, DO  omeprazole (PRILOSEC) 40 MG capsule Take 1 capsule (40 mg total) by mouth daily. 03/11/23   Iva Boop, MD  SYRINGE-NEEDLE, DISP, 3 ML (B-D 3CC LUER-LOK SYR 25GX1") 25G X 1" 3 ML MISC Use as directed to inject b-12 into the skin 05/29/22   Kuneff, Renee A, DO    Current Outpatient Medications  Medication Sig Dispense Refill   ALPRAZolam (XANAX) 0.5 MG tablet Take 1 tablet (0.5 mg total) by mouth 3 (three) times daily as needed for anxiety. 90 tablet 2   amLODipine (NORVASC) 2.5 MG tablet Take 1 tablet (2.5 mg total) by mouth daily. 90 tablet 0   atenolol (TENORMIN) 25 MG tablet Take 1 tablet (25 mg total) by mouth daily. 90 tablet 3   cyanocobalamin (VITAMIN B12) 1000 MCG/ML injection Inject 1 mL (1,000 mcg total) into the muscle every 14 (fourteen) days. 6 mL 3   lamoTRIgine (LAMICTAL) 200 MG tablet Take 1 tablet (200 mg total) by mouth at bedtime. 90 tablet 3   lamoTRIgine (LAMICTAL) 25 MG tablet Take 2 tablets (50 mg total) by mouth daily. 60 tablet 2   QUEtiapine (SEROQUEL) 50 MG tablet Take 1 tablet (50 mg total) by mouth at bedtime. 90 tablet 1   albuterol (VENTOLIN HFA) 108 (90 Base) MCG/ACT inhaler Inhale 2 puffs 15 minutes before exercise and as needed for wheeze. 6.7 g 5   esomeprazole (NEXIUM) 40 MG capsule Take by mouth.     hydrocortisone (ANUSOL-HC) 2.5 % rectal cream Insert rectally 2 times daily for 10 days 30 g 1   ketoconazole (NIZORAL) 2 % shampoo Use topically as directed for 30 days 120 mL 5   meloxicam (MOBIC) 15 MG tablet Take 1 tablet (15 mg total) by mouth daily. 90 tablet 0   omeprazole (PRILOSEC) 40 MG capsule Take 1 capsule (40 mg total) by mouth daily. 90 capsule 3   SYRINGE-NEEDLE, DISP, 3 ML (B-D 3CC LUER-LOK SYR 25GX1") 25G X 1"  3 ML MISC Use as directed to inject b-12 into the skin 6 each 3   Current Facility-Administered Medications  Medication Dose Route Frequency Provider Last Rate Last Admin   0.9 %  sodium chloride infusion  500 mL Intravenous Continuous Iva Boop, MD  Allergies as of 05/07/2023 - Review Complete 05/07/2023  Allergen Reaction Noted   Aspartame and phenylalanine Other (See Comments) 02/04/2013   Beef-derived products Other (See Comments) 06/13/2012   Citrus Other (See Comments) 06/13/2012   Penicillins Hives and Rash 02/13/2007   Promethazine hcl Other (See Comments) 02/13/2007   Beta vulgaris Other (See Comments) 11/02/2019   Adhesive [tape] Other (See Comments) 04/20/2013   Morphine sulfate Nausea And Vomiting     Family History  Problem Relation Age of Onset   Diverticulosis Mother    Hypertension Mother    Osteoarthritis Mother    Depression Mother    Hyperlipidemia Mother    Kidney disease Mother    Coronary artery disease Father    Heart attack Father    Hypertension Father    Depression Father    Hyperlipidemia Father    Alcohol abuse Father    Early death Father    Kidney disease Father    Mental illness Father    Alcohol abuse Sister    Depression Sister    Colon polyps Sister    Kidney disease Sister    Alcohol abuse Daughter    Depression Daughter    Drug abuse Daughter    Alcohol abuse Son    Depression Son    Diabetes Paternal Uncle    Hypertension Maternal Grandmother    Stroke Maternal Grandmother    Hypertension Maternal Grandfather    Pancreatic cancer Maternal Grandfather    Early death Maternal Grandfather    Depression Paternal Grandmother    Dementia Paternal Grandmother    Arthritis Paternal Grandmother    Mental illness Paternal Grandmother    Stroke Paternal Grandfather    Hyperlipidemia Paternal Grandfather    Alcohol abuse Paternal Grandfather    Early death Paternal Grandfather    Hypertension Paternal Grandfather     Colon cancer Neg Hx     Social History   Socioeconomic History   Marital status: Married    Spouse name: Not on file   Number of children: Not on file   Years of education: Not on file   Highest education level: Not on file  Occupational History   Occupation: Teacher, adult education: [employer]  Tobacco Use   Smoking status: Never   Smokeless tobacco: Never  Vaping Use   Vaping status: Never Used  Substance and Sexual Activity   Alcohol use: Yes    Alcohol/week: 2.0 standard drinks of alcohol    Types: 1 Shots of liquor, 1 Standard drinks or equivalent per week    Comment: one margarita a week   Drug use: No   Sexual activity: Not Currently    Partners: Male  Other Topics Concern   Not on file  Social History Narrative   Spiritual Beliefs: methodist   Marital status/children/pets: In second marriage.  First husband committed suicide.  2 children.   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  Physical Exam: Vital signs BP (!) 161/87   Pulse 72   Temp 97.8 F (36.6 C)   Ht 5\' 2"  (1.575 m)   Wt 117 lb (53.1 kg)   SpO2 97%   BMI 21.40 kg/m   General:   Alert,  Well-developed, well-nourished, pleasant and cooperative in NAD Lungs:  Clear throughout to auscultation.   Heart:  Regular rate and rhythm; no murmurs, clicks, rubs,  or gallops. Abdomen:  Soft, nontender and nondistended. Normal bowel sounds.   Neuro/Psych:  Alert and  cooperative. Normal mood and affect. A and O x 3   @Elianys Conry  Sena Slate, MD, [facility] [phone] (pager) 05/07/2023 1:31 PM@

## 2023-05-07 NOTE — Progress Notes (Signed)
Report given to PACU, vss 

## 2023-05-08 ENCOUNTER — Telehealth: Payer: Self-pay

## 2023-05-08 NOTE — Telephone Encounter (Signed)
  Follow up Call-     05/07/2023   12:41 PM  Call back number  Post procedure Call Back phone  # will talk in office, employee  Permission to leave phone message Yes     Patient questions:  Do you have a fever, pain , or abdominal swelling? No. Pain Score  0 *  Have you tolerated food without any problems? Yes.    Have you been able to return to your normal activities? Yes.    Do you have any questions about your discharge instructions: Diet   No. Medications  No. Follow up visit  No.  Do you have questions or concerns about your Care? No.  Actions: * If pain score is 4 or above: No action needed, pain <4.

## 2023-05-08 NOTE — Telephone Encounter (Signed)
noted 

## 2023-05-11 ENCOUNTER — Encounter: Payer: Self-pay | Admitting: Adult Health

## 2023-05-11 ENCOUNTER — Ambulatory Visit (INDEPENDENT_AMBULATORY_CARE_PROVIDER_SITE_OTHER): Payer: Commercial Managed Care - PPO | Admitting: Psychiatry

## 2023-05-11 ENCOUNTER — Ambulatory Visit (INDEPENDENT_AMBULATORY_CARE_PROVIDER_SITE_OTHER): Payer: Commercial Managed Care - PPO | Admitting: Adult Health

## 2023-05-11 DIAGNOSIS — F331 Major depressive disorder, recurrent, moderate: Secondary | ICD-10-CM

## 2023-05-11 DIAGNOSIS — F411 Generalized anxiety disorder: Secondary | ICD-10-CM

## 2023-05-11 DIAGNOSIS — F4310 Post-traumatic stress disorder, unspecified: Secondary | ICD-10-CM

## 2023-05-11 DIAGNOSIS — F410 Panic disorder [episodic paroxysmal anxiety] without agoraphobia: Secondary | ICD-10-CM

## 2023-05-11 DIAGNOSIS — G4700 Insomnia, unspecified: Secondary | ICD-10-CM | POA: Diagnosis not present

## 2023-05-11 NOTE — Progress Notes (Signed)
[name] [id number] 01-31-1956 67 y.o.  Subjective:   Patient ID:  [name] is a 67 y.o. (DOB 1956/05/28) female.  Chief Complaint: No chief complaint on file.   HPI [name] presents to the office today for follow-up of MDD, GAD, panic disorder, insomnia and PTSD.  Describes mood today as "not too good". Pleasant. Reports tearfulness. Mood symptoms - reports anxiety, depression, and irritability. Denies panic attacks. Reports worry, rumination, and over thinking. Mood is lower - unable to determine if Lamictal helpful since recently started. Stating "I'm  feeling about the same". Feels like medications are helpful, but is not feeling well. Varying interest and motivation. Seeing therapist - Rockne Menghini. Taking medications as prescribed.  Energy levels stable. Active, has a regular exercise routine - biking 60 to 80 miles a week. Enjoys some usual interests and activities. Married. Lives with husband of 16 years and 5 dogs. Mother lives in [place]. Spending time with family. Appetite adequate. Weight stable - 116 pounds. Sleeps has improved. Averages 6 hours. Focus and concentration improved. Completing tasks. Managing aspects of household. 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  Denies SI or HI.  Denies AH or VH. Denies self harm. Denies substance use.  Previous medication trials: Remeron, Trazadone, Restoril, Xanax, Seroquel, Belsomra    GAD-7    Flowsheet Row Office Visit from 04/03/2023 in [facility] Visit from 05/29/2022 in [facility] Visit from 01/01/2022 in [facility] Office Visit from 04/27/2019 in [facility] Office Visit from 12/29/2018 in [facility]  Total GAD-7 Score 17 11 7 11 18       Mini-Mental    Flowsheet Row Office Visit from 12/22/2016 in [facility]  Total Score (max 30 points ) 30      PHQ2-9    Flowsheet Row Office Visit from 04/03/2023 in [facility] Visit from 09/08/2022 in [facility] Visit from 05/29/2022 in [facility] [place] Office Visit from 01/01/2022 in [facility] [place] Office Visit from 05/27/2021 in [facility] [place]  PHQ-2 Total Score 2 1 0 0 0  PHQ-9 Total Score 8 6 3 1  --      Flowsheet Row ED from 02/26/2023 in [facility])  C-SSRS RISK CATEGORY No Risk        Review of Systems:  Review of Systems  Musculoskeletal:  Negative for gait problem.  Neurological:  Negative for tremors.  Psychiatric/Behavioral:         Please refer to HPI    Medications: I have reviewed the patient's current medications.  Current Outpatient Medications  Medication Sig Dispense Refill   albuterol (VENTOLIN HFA) 108 (90 Base) MCG/ACT inhaler Inhale 2 puffs 15 minutes before exercise and as needed for wheeze. 6.7 g 5   ALPRAZolam (XANAX) 0.5 MG tablet Take 1 tablet (0.5 mg total) by mouth 3 (three) times daily as needed for anxiety. 90 tablet 2   amLODipine (NORVASC) 2.5 MG tablet Take 1 tablet (2.5 mg total) by mouth daily. 90 tablet 0   atenolol (TENORMIN) 25 MG tablet Take 1 tablet (25 mg total) by mouth daily. 90 tablet 3  cyanocobalamin (VITAMIN B12) 1000 MCG/ML injection Inject 1 mL (1,000 mcg total) into the muscle every 14 (fourteen) days. 6 mL 3   esomeprazole (NEXIUM) 40 MG capsule Take by mouth.     hydrocortisone (ANUSOL-HC) 2.5 % rectal cream Insert rectally 2 times daily for 10 days 30 g 1   ketoconazole (NIZORAL) 2 % shampoo Use topically as directed for 30 days 120 mL 5   lamoTRIgine (LAMICTAL) 200 MG tablet Take 1 tablet (200 mg total) by mouth at bedtime. 90 tablet  3   lamoTRIgine (LAMICTAL) 25 MG tablet Take 2 tablets (50 mg total) by mouth daily. 60 tablet 2   meloxicam (MOBIC) 15 MG tablet Take 1 tablet (15 mg total) by mouth daily. 90 tablet 0   omeprazole (PRILOSEC) 40 MG capsule Take 1 capsule (40 mg total) by mouth daily. 90 capsule 3   QUEtiapine (SEROQUEL) 50 MG tablet Take 1 tablet (50 mg total) by mouth at bedtime. 90 tablet 1   SYRINGE-NEEDLE, DISP, 3 ML (B-D 3CC LUER-LOK SYR 25GX1") 25G X 1" 3 ML MISC Use as directed to inject b-12 into the skin 6 each 3   No current facility-administered medications for this visit.    Medication Side Effects: None  Allergies:  Allergies  Allergen Reactions   Aspartame And Phenylalanine Other (See Comments)    Migraines   Beef-Derived Products Other (See Comments)    severe cramping   Citrus Other (See Comments)    Causes Interstitial cystitis flares   Penicillins Hives and Rash    Denies airway involvement Has patient had a PCN reaction causing immediate rash, facial/tongue/throat swelling, SOB or lightheadedness with hypotension: yes hives.  Has patient had a PCN reaction causing severe rash involving mucus membranes or skin necrosis:no Has patient had a PCN reaction that required hospitalization No Has patient had a PCN reaction occurring within the last 10 years: No If all of the above answers are "NO", then may proceed with Cephalosporin use.    Promethazine Hcl Other (See Comments)    Muscle twitching and contracture    Beta Vulgaris Other (See Comments)    beets   Adhesive [Tape] Other (See Comments)    Red splotches   Morphine Sulfate Nausea And Vomiting    Past Medical History:  Diagnosis Date   Allergy    Anemia    after birth of child 5yrs ago   Anxiety and depression    with insomnia   Autoimmune disease (HCC) 08/06/2020   Blood transfusion without reported diagnosis    x3   Chronic back pain    ? RA, ? Lupus - reports saw rheumatologist in the past but better now, sees  Dr. Dutch Quint   Eczema    Flushing 05/27/2021   GERD 02/13/2007   Qualifier: Diagnosis of  By: Jonny Ruiz MD, Len Blalock    GERD (gastroesophageal reflux disease)    takes Omeprazole daily, with nausea   Greater trochanteric bursitis of left hip 03/25/2019   Injected March 25, 2019   Heart murmur    Hemorrhoids    History of kidney stones    passed on her own   History of migraine    last one a yr ago and takes Zomig prn   Hot flashes 11/10/2014   HTN (hypertension) 03/31/2012   Hx of echocardiogram    Echo (8/15):  EF 55-60%, no RWMA   IBS (irritable bowel syndrome)    constipation predominant   Interstitial cystitis    hx  of UTI   Irritable bowel syndrome 12/14/2009   Qualifier: Diagnosis of  By: Leone Payor MD, Charlyne Quale    Memory loss 12/22/2016   Numbness    both legs and related to back   Osteoporosis    osteopenia   Palpitations    on metoprolol in past but made BP too low   Pneumonia    hx of;last time 46yrs ago   PONV (postoperative nausea and vomiting)    laryngospasm-1999   Poor vision 10/15/2015   -? Ocular rosacea -sees opthomology    Raynaud's disease 10/21/2012   Raynaud's disease /phenomenon 10/21/2012   no meds, worse in the winter or cold environment.    Rheumatoid aortitis    uses Diclofenac gel;Rheumatoid   Seasonal allergies    Flonase daily    Strain of left piriformis muscle 05/18/2019   Urinary incontinence     Past Medical History, Surgical history, Social history, and Family history were reviewed and updated as appropriate.   Please see review of systems for further details on the patient's review from today.   Objective:   Physical Exam:  There were no vitals taken for this visit.  Physical Exam Constitutional:      General: She is not in acute distress. Musculoskeletal:        General: No deformity.  Neurological:     Mental Status: She is alert and oriented to person, place, and time.     Coordination: Coordination normal.   Psychiatric:        Attention and Perception: Attention and perception normal. She does not perceive auditory or visual hallucinations.        Mood and Affect: Affect is not labile, blunt, angry or inappropriate.        Speech: Speech normal.        Behavior: Behavior normal.        Thought Content: Thought content normal. Thought content is not paranoid or delusional. Thought content does not include homicidal or suicidal ideation. Thought content does not include homicidal or suicidal plan.        Cognition and Memory: Cognition and memory normal.        Judgment: Judgment normal.     Comments: Insight intact     Lab Review:     Component Value Date/Time   NA 139 04/03/2023 0950   NA 143 12/22/2016 0948   K 4.3 04/03/2023 0950   CL 103 04/03/2023 0950   CO2 28 04/03/2023 0950   GLUCOSE 78 04/03/2023 0950   GLUCOSE 95 04/21/2006 1635   BUN 19 04/03/2023 0950   BUN 17 12/22/2016 0948   CREATININE 1.00 04/03/2023 0950   CALCIUM 10.0 04/03/2023 0950   PROT 7.1 04/03/2023 0950   PROT 7.0 12/22/2016 0948   ALBUMIN 4.4 04/03/2023 0950   ALBUMIN 4.7 12/22/2016 0948   AST 27 04/03/2023 0950   ALT 20 04/03/2023 0950   ALKPHOS 99 04/03/2023 0950   BILITOT 0.4 04/03/2023 0950   BILITOT 0.3 12/22/2016 0948   GFRNONAA 65 12/22/2016 0948   GFRAA 75 12/22/2016 0948       Component Value Date/Time   WBC 4.5 04/03/2023 0950   RBC 4.70 04/03/2023 0950   HGB 14.1 04/03/2023 0950   HGB 14.1 12/22/2016 0948   HCT 43.7 04/03/2023 0950   HCT 44.0 12/22/2016 0948   PLT 204.0 04/03/2023 0950   PLT 206 12/22/2016 0948   MCV 93.0 04/03/2023 0950   MCV 94 12/22/2016 0948  MCH 30.3 12/22/2016 0948   MCH 30.7 11/01/2015 1154   MCHC 32.2 04/03/2023 0950   RDW 13.8 04/03/2023 0950   RDW 14.3 12/22/2016 0948   LYMPHSABS 1.4 04/03/2023 0950   LYMPHSABS 0.9 12/22/2016 0948   MONOABS 0.4 04/03/2023 0950   EOSABS 0.3 04/03/2023 0950   EOSABS 0.1 12/22/2016 0948   BASOSABS 0.1 04/03/2023  0950   BASOSABS 0.0 12/22/2016 0948    No results found for: "POCLITH", "LITHIUM"   No results found for: "PHENYTOIN", "PHENOBARB", "VALPROATE", "CBMZ"   .res Assessment: Plan:    Plan:  PDMP reviewed  Seroquel 50mg  at bedtime  Xanax 0.5mg  TID - decreased use Lamictal 200mg  daily  Started Lamictal 25mg  x 7 days, then increase to 50mg  daily.  Patient FMLA paperwork to be completed - she will need to take one day a week off until December 31st 2024 for current mood instability.  RTC on July 13, 2023.  Patient advised to contact office with any questions, adverse effects, or acute worsening in signs and symptoms.  Discussed potential benefits, risk, and side effects of benzodiazepines to include potential risk of tolerance and dependence, as well as possible drowsiness.  Advised patient not to drive if experiencing drowsiness and to take lowest possible effective dose to minimize risk of dependence and tolerance.  There are no diagnoses linked to this encounter.   Please see After Visit Summary for patient specific instructions.  Future Appointments  Date Time Provider Department Center  05/11/2023 10:00 AM Mathis Fare, LCSW [facility] None  05/19/2023  8:00 AM Mathis Fare, LCSW [facility] None  06/01/2023 11:00 AM Mathis Fare, LCSW [facility] None  06/02/2023  9:00 AM Claiborne Billings, Renee A, DO [facility]  06/16/2023 10:20 AM Cricket Goodlin, Thereasa Solo, NP [facility] None  07/21/2023 10:00 AM Harrie Cazarez, Thereasa Solo, NP [facility] None    No orders of the defined types were placed in this encounter.   -------------------------------

## 2023-05-11 NOTE — Telephone Encounter (Signed)
Noted have received paperwork and pt seen this morning. Will complete

## 2023-05-11 NOTE — Progress Notes (Signed)
[facility] Counselor/Therapist Progress Note  Patient ID: [name], MRN: [id number],    Date: 05/11/2023  Time Spent: 53 minutes   Treatment Type: Individual Therapy  Reported Symptoms: anxiety, depression decreasing some, anger decreased, improved self control, not as irritable    Mental Status Exam:  Appearance:   Casual     Behavior:  Appropriate, Sharing, and Motivated  Motor:  Normal  Speech/Language:   Clear and Coherent  Affect:  Depressed and anxious  Mood:  anxious and depressed  Thought process:  goal directed  Thought content:    Rumination  Sensory/Perceptual disturbances:    WNL  Orientation:  oriented to person, place, time/date, situation, day of week, month of year, and year  Attention:  Good  Concentration:  Good  Memory:  WNL  Fund of knowledge:   Good  Insight:    Good  Judgment:   Good  Impulse Control:  Good   Risk Assessment: Danger to Self:  No Self-injurious Behavior: No Danger to Others: No Duty to Warn:no Physical Aggression / Violence:No  Access to Firearms a concern: No  Gang Involvement:No   Subjective: Patient in session today and reports symptoms of anxiety, anger, depression, and frustration.Talked with elderly mom yesterday and was somewhat of a better conversation than usual. More positive today. FMLA is being processed and patient feeling more positive that this will help her emotionally and physically. Current distress and symptoms related to: job stress, money, some interpersonal issues with husband, and elderly mother. 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 Still rides bike x3 weekly as she is able. Worked further today on her own self-care, anger, decision-making, depression, and improving her self-talk. Acknowledges he need "to not be my own worst enemy." Discussed her need and ways of decreasing her own negative self-talk, and worked with several examples at her work and at home in relationship with husband. Some  decrease in anger and frustration as she also worked today on some coping skills for better management of emotions. Marital situation "feeling more fair and somewhat less stressed." Trying to have healthier boundaries and more regularly use positive self-talk and self-calming behaviors intentionally.   Interventions: Cognitive Behavioral Therapy and Ego-Supportive  Long term goal: Develop healthy cognitive patterns and beliefs about self and the world that lead to alleviation of depression and anxiety and help prevent relapse.  Progressing: Patient is motivated. Struggling to focus some due to immediate, unexpected health concern with husband.  Short term goal: Learn and implement personal skills for managing stress, solving daily problems, and resolving conflicts effectively. Strategy: Teach patient calming skills, problem solving skills, and conflict resolution skills, to better manage daily stressors   Diagnosis:   ICD-10-CM   1. 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  Has made some progress with medication and goal-directed behaviors trying to move in a more calming and hopeful direction.  Reminded and encouraged patient in her practice of more positive and self affirming behaviors as cited in sessions including: Making healthy sleep patterns a priority each night, taking her medications as prescribed, exercising on her bike to help deal with her stress, taking breaks as needed throughout the day to experience more calmness, deep breathing exercises, setting limits to better manage her stress, staying in touch with supportive people, working on letting go of negativity and over worrying in order  to see more positive options for herself, healthy boundaries with others, and realize the strengths she shows working with goal-directed behaviors to move in a direction that supports her  overall improved emotional health and her outlook into the future.  Goal review and progress/challenges noted with patient.  Next appointment within 2 to 3 weeks.   Mathis Fare, LCSW

## 2023-05-12 DIAGNOSIS — Z0289 Encounter for other administrative examinations: Secondary | ICD-10-CM

## 2023-05-12 NOTE — Telephone Encounter (Signed)
Paperwork completed and signed by Rene Kocher

## 2023-05-19 ENCOUNTER — Ambulatory Visit: Payer: Commercial Managed Care - PPO | Admitting: Psychiatry

## 2023-05-20 DIAGNOSIS — H5213 Myopia, bilateral: Secondary | ICD-10-CM | POA: Diagnosis not present

## 2023-05-26 ENCOUNTER — Other Ambulatory Visit (HOSPITAL_COMMUNITY): Payer: Self-pay

## 2023-05-26 ENCOUNTER — Ambulatory Visit: Payer: Commercial Managed Care - PPO | Admitting: Psychiatry

## 2023-05-26 DIAGNOSIS — F331 Major depressive disorder, recurrent, moderate: Secondary | ICD-10-CM

## 2023-05-26 DIAGNOSIS — Z79899 Other long term (current) drug therapy: Secondary | ICD-10-CM | POA: Diagnosis not present

## 2023-05-26 DIAGNOSIS — M1991 Primary osteoarthritis, unspecified site: Secondary | ICD-10-CM | POA: Diagnosis not present

## 2023-05-26 DIAGNOSIS — M180 Bilateral primary osteoarthritis of first carpometacarpal joints: Secondary | ICD-10-CM | POA: Diagnosis not present

## 2023-05-26 DIAGNOSIS — M0600 Rheumatoid arthritis without rheumatoid factor, unspecified site: Secondary | ICD-10-CM | POA: Diagnosis not present

## 2023-05-26 DIAGNOSIS — Z6820 Body mass index (BMI) 20.0-20.9, adult: Secondary | ICD-10-CM | POA: Diagnosis not present

## 2023-05-26 DIAGNOSIS — Z1589 Genetic susceptibility to other disease: Secondary | ICD-10-CM | POA: Diagnosis not present

## 2023-05-26 MED ORDER — MELOXICAM 15 MG PO TABS
15.0000 mg | ORAL_TABLET | Freq: Every day | ORAL | 3 refills | Status: AC
  Filled 2023-05-26: qty 30, 30d supply, fill #0
  Filled 2023-06-22: qty 30, 30d supply, fill #1
  Filled 2024-01-05: qty 30, 30d supply, fill #2
  Filled 2024-02-01: qty 30, 30d supply, fill #3

## 2023-05-26 NOTE — Progress Notes (Signed)
[facility] Counselor/Therapist Progress Note  Patient ID: [name], MRN: [id number],    Date: 05/26/2023  Time Spent: 53 minutes   Treatment Type: Individual Therapy  Reported Symptoms: anxiety, depression, anger within family, irritable   Mental Status Exam:  Appearance:   Casual     Behavior:  Appropriate, Sharing, and Motivated  Motor:  Normal  Speech/Language:   Clear and Coherent  Affect:  Depressed  Mood:  Anxious, irritable, depressed  Thought process:  goal directed  Thought content:    Rumination  Sensory/Perceptual disturbances:    WNL  Orientation:  oriented to person, place, time/date, situation, day of week, month of year, year, and stated date of Nov. 12, 2024  Attention:  Good  Concentration:  Good  Memory:  WNL  Fund of knowledge:   Good  Insight:    Good  Judgment:   Good  Impulse Control:  Good   Risk Assessment: Danger to Self:  No Self-injurious Behavior: No Danger to Others: No Duty to Warn:no Physical Aggression / Violence:No  Access to Firearms a concern: No  Gang Involvement:No   Subjective: Patient today in session and reporting depression, anxiety, irritability, and anger within family. Is in process of finalizing her being out on FMLA per med provier, due to family stress and difficulty coping. Lots of anger, frustration, and irritability within marital and extended family relationships. Patient needed session today to vent and discuss her feelings of anger and frustration within family and extended family.  Patient feeling very torn within her family and husband's mother/family. Processed a lot of her anger, irritability, frustration, trust issues, and trying to make good decisions.  Having arthritis issues in hand/thumb.Riding her bike x3 weekly as exercise that is good for her.  Is in the midst of starting FMLA at her job and feels that will help her stress.  Interpersonal issues with husband continue along with elderly mother.   Did focus more today on her own self-care and how to manage her anger, decision making, depression, and frustrations in healthier ways which we outlined today in session.  Needing to "not be my own worst enemy".  Discussed strategies for helping make this happen.  Marital situation continues to be stressful even though at last session she thought it was becoming less stressful.  Encouraged her use of more positive self talk and self calming behaviors.  Also encouraged her not to make assumptions about what someone else is thinking and instead be able to ask them.  Interventions: Cognitive Behavioral Therapy and Ego-Supportive  Long term goal: Develop healthy cognitive patterns and beliefs about self and the world that lead to alleviation of depression and anxiety and help prevent relapse.  Progressing: Patient is motivated. Struggling to focus some due to immediate, unexpected health concern with husband.  Short term goal: Learn and implement personal skills for managing stress, solving daily problems, and resolving conflicts effectively. Strategy: Teach patient calming skills, problem solving skills, and conflict resolution skills, to better manage daily stressors  Diagnosis:   ICD-10-CM   1. 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  Has made some progress in some areas and feels that being on FMLA at her job will help her with her stress.  She is making progress and needs to continue with goal-directed behaviors in order to move in a more hopeful  and healthier direction.  Reminded and encouraged patient in her practice of more positive and self affirming behaviors as noted in sessions including: Taking her medications as prescribed, exercising on her bike to help deal with her stress, taking breaks as needed throughout the day to experience more calmness, use of  deep breathing exercises, setting limits to better manage her stress, making healthy sleep patterns a priority each night, stay in touch with supportive people, work on letting go of negativity and over worrying in order to see more positive options for herself, healthy boundaries with others, and recognize the strength she shows working with goal-directed behaviors to move in a direction that supports her improved emotional health and overall wellbeing.  Goal review and progress/challenges noted with patient.  Next appt within 2-3 weeks.   Mathis Fare, LCSW

## 2023-05-27 ENCOUNTER — Other Ambulatory Visit: Payer: Self-pay

## 2023-05-27 ENCOUNTER — Encounter: Payer: Self-pay | Admitting: Psychiatry

## 2023-05-30 ENCOUNTER — Other Ambulatory Visit (HOSPITAL_COMMUNITY): Payer: Self-pay

## 2023-06-01 ENCOUNTER — Other Ambulatory Visit: Payer: Self-pay

## 2023-06-01 ENCOUNTER — Ambulatory Visit (INDEPENDENT_AMBULATORY_CARE_PROVIDER_SITE_OTHER): Payer: Commercial Managed Care - PPO | Admitting: Psychiatry

## 2023-06-02 ENCOUNTER — Encounter: Payer: Self-pay | Admitting: Family Medicine

## 2023-06-08 DIAGNOSIS — F4323 Adjustment disorder with mixed anxiety and depressed mood: Secondary | ICD-10-CM | POA: Diagnosis not present

## 2023-06-16 ENCOUNTER — Ambulatory Visit: Payer: Commercial Managed Care - PPO | Admitting: Psychiatry

## 2023-06-16 ENCOUNTER — Encounter: Payer: Self-pay | Admitting: Family Medicine

## 2023-06-16 ENCOUNTER — Ambulatory Visit: Payer: Commercial Managed Care - PPO | Admitting: Adult Health

## 2023-06-16 ENCOUNTER — Other Ambulatory Visit (HOSPITAL_COMMUNITY): Payer: Self-pay

## 2023-06-16 ENCOUNTER — Other Ambulatory Visit: Payer: Self-pay

## 2023-06-16 ENCOUNTER — Ambulatory Visit (INDEPENDENT_AMBULATORY_CARE_PROVIDER_SITE_OTHER): Payer: Commercial Managed Care - PPO | Admitting: Family Medicine

## 2023-06-16 VITALS — BP 118/78 | HR 67 | Temp 98.1°F | Wt 118.2 lb

## 2023-06-16 DIAGNOSIS — Z0000 Encounter for general adult medical examination without abnormal findings: Secondary | ICD-10-CM | POA: Diagnosis not present

## 2023-06-16 DIAGNOSIS — E538 Deficiency of other specified B group vitamins: Secondary | ICD-10-CM | POA: Diagnosis not present

## 2023-06-16 DIAGNOSIS — Z1231 Encounter for screening mammogram for malignant neoplasm of breast: Secondary | ICD-10-CM | POA: Diagnosis not present

## 2023-06-16 DIAGNOSIS — M1991 Primary osteoarthritis, unspecified site: Secondary | ICD-10-CM | POA: Insufficient documentation

## 2023-06-16 DIAGNOSIS — I7300 Raynaud's syndrome without gangrene: Secondary | ICD-10-CM | POA: Diagnosis not present

## 2023-06-16 DIAGNOSIS — M180 Bilateral primary osteoarthritis of first carpometacarpal joints: Secondary | ICD-10-CM | POA: Insufficient documentation

## 2023-06-16 DIAGNOSIS — Z23 Encounter for immunization: Secondary | ICD-10-CM

## 2023-06-16 DIAGNOSIS — Z131 Encounter for screening for diabetes mellitus: Secondary | ICD-10-CM

## 2023-06-16 DIAGNOSIS — Z13220 Encounter for screening for lipoid disorders: Secondary | ICD-10-CM

## 2023-06-16 DIAGNOSIS — R002 Palpitations: Secondary | ICD-10-CM

## 2023-06-16 LAB — LIPID PANEL
Cholesterol: 228 mg/dL — ABNORMAL HIGH (ref 0–200)
HDL: 66.8 mg/dL (ref >39.00)
LDL Cholesterol: 135 mg/dL — ABNORMAL HIGH (ref 0–99)
NonHDL: 161.59
Total CHOL/HDL Ratio: 3
Triglycerides: 133 mg/dL (ref 0.0–149.0)
VLDL: 26.6 mg/dL (ref 0.0–40.0)

## 2023-06-16 LAB — COMPREHENSIVE METABOLIC PANEL
ALT: 18 U/L (ref 0–35)
AST: 27 U/L (ref 0–37)
Albumin: 4.6 g/dL (ref 3.5–5.2)
Alkaline Phosphatase: 104 U/L (ref 39–117)
BUN: 21 mg/dL (ref 6–23)
CO2: 32 meq/L (ref 19–32)
Calcium: 10.2 mg/dL (ref 8.4–10.5)
Chloride: 102 meq/L (ref 96–112)
Creatinine, Ser: 1.15 mg/dL (ref 0.40–1.20)
GFR: 49.53 mL/min — ABNORMAL LOW (ref >60.00)
Glucose, Bld: 96 mg/dL (ref 70–99)
Potassium: 5.1 meq/L (ref 3.5–5.1)
Sodium: 139 meq/L (ref 135–145)
Total Bilirubin: 0.6 mg/dL (ref 0.2–1.2)
Total Protein: 6.9 g/dL (ref 6.0–8.3)

## 2023-06-16 LAB — B12 AND FOLATE PANEL
Folate: 18.1 ng/mL (ref >5.9)
Vitamin B-12: 518 pg/mL (ref 211–911)

## 2023-06-16 LAB — HEMOGLOBIN A1C: Hgb A1c MFr Bld: 6 % (ref 4.6–6.5)

## 2023-06-16 MED ORDER — ATENOLOL 25 MG PO TABS
25.0000 mg | ORAL_TABLET | Freq: Every day | ORAL | 3 refills | Status: DC
  Filled 2023-06-16 – 2023-06-22 (×2): qty 90, 90d supply, fill #0
  Filled 2023-09-30: qty 90, 90d supply, fill #1
  Filled 2024-01-05: qty 90, 90d supply, fill #2
  Filled 2024-04-18: qty 90, 90d supply, fill #3

## 2023-06-16 MED ORDER — BD LUER-LOK SYRINGE 25G X 1" 3 ML MISC
3 refills | Status: DC
  Filled 2023-06-16: qty 6, 84d supply, fill #0
  Filled 2023-12-10 – 2023-12-18 (×2): qty 6, 84d supply, fill #1
  Filled 2024-04-18: qty 6, 84d supply, fill #2

## 2023-06-16 MED ORDER — AMLODIPINE BESYLATE 2.5 MG PO TABS
2.5000 mg | ORAL_TABLET | Freq: Every day | ORAL | 3 refills | Status: DC
  Filled 2023-06-16: qty 90, 90d supply, fill #0

## 2023-06-16 MED ORDER — CYANOCOBALAMIN 1000 MCG/ML IJ SOLN
1000.0000 ug | INTRAMUSCULAR | 3 refills | Status: DC
  Filled 2023-06-16: qty 6, 84d supply, fill #0
  Filled 2023-12-10 – 2023-12-18 (×2): qty 6, 84d supply, fill #1
  Filled 2024-04-18: qty 6, 84d supply, fill #2

## 2023-06-16 NOTE — Patient Instructions (Addendum)
Return in about 1 year (around 06/16/2024) for cpe (20 min), Routine chronic condition follow-up.        Great to see you today.  I have refilled the medication(s) we provide.   If labs were collected or images ordered, we will inform you of  results once we have received them and reviewed. We will contact you either by echart message, or telephone call.  Please give ample time to the testing facility, and our office to run,  receive and review results. Please do not call inquiring of results, even if you can see them in your chart. We will contact you as soon as we are able. If it has been over 1 week since the test was completed, and you have not yet heard from [place], then please call us.    - echart message- for normal results that have been seen by the patient already.   - telephone call: abnormal results or if patient has not viewed results in their echart.  If a referral to a specialist was entered for you, please call us in 2 weeks if you have not heard from the specialist office to schedule.

## 2023-06-16 NOTE — Progress Notes (Signed)
Patient ID: [name], female  DOB: [birth year], 67 y.o.   MRN: [id number] Patient Care Team    Relationship Specialty Notifications Start End  Natalia Leatherwood, DO PCP - General Family Medicine  12/29/18   Zenovia Jordan, MD Consulting Physician Rheumatology  04/09/17   Freddy Finner, MD (Inactive) Consulting Physician Obstetrics and Gynecology  04/09/17   Jacqlyn Krauss, MD Referring Physician Dermatology  04/09/17   Group, [facility]  12/29/18    Comment: [facility]    12/29/18   Iva Boop, MD Consulting Physician Gastroenterology  12/29/18   Julio Sicks, MD Consulting Physician Neurosurgery  12/29/18     Chief Complaint  Patient presents with   Annual Exam    Chronic Conditions/illness Management     Subjective: [name] is a 67 y.o.  Female  present for CPE and Chronic Conditions/illness Management  All past medical history, surgical history, allergies, family history, immunizations, medications and social history were updated in the electronic medical record today. All recent labs, ED visits and hospitalizations within the last year were reviewed.  Health maintenance:  Colonoscopy: completed 04/03/2023, 10 yr follow up Dr. Leone Payor. Mammogram: 09/02/2022 [facility]ordered for 08/2023 Immunizations: tdap UTD 2022, Influenza UTD 04/2023(encouraged yearly),shingrix completed, covid series completed, PNA20 completed Infectious disease screening: Hep C completed DEXA: last completed 2020 at gyn Patient has a Dental home. Hospitalizations/ED visits: Reviewed  Palpitations: Patient reports compliance with atenolol 25 mg daily Raynauds: Patient uses amlodipine as needed. B12 deficiency: B12 injections are being performed at home.     04/03/2023    9:21 AM 09/08/2022    1:13 PM 05/29/2022   10:44 AM 01/01/2022    2:16 PM 05/27/2021   10:37 AM  Depression screen PHQ 2/9  Decreased Interest 0 0  0 0 0  Down, Depressed, Hopeless 2 1 0 0 0  PHQ - 2 Score 2 1 0 0 0  Altered sleeping 3 3 3 1    Tired, decreased energy  1 0 0   Change in appetite 0 1 0 0   Feeling bad or failure about yourself  1 0 0 0   Trouble concentrating 0 0 0 0   Moving slowly or fidgety/restless 2 0 0 0   Suicidal thoughts 0 0 0 0   PHQ-9 Score 8 6 3 1    Difficult doing work/chores Not difficult at all Not difficult at all         04/03/2023    9:22 AM 05/29/2022   10:45 AM 01/01/2022    2:17 PM 04/27/2019    9:39 AM  GAD 7 : Generalized Anxiety Score  Nervous, Anxious, on Edge 3 2 2 1   Control/stop worrying 3 3 1 2   Worry too much - different things 3 3 1 2   Trouble relaxing 3 1 1 1   Restless 2 1 1 1   Easily annoyed or irritable 2 1 1 2   Afraid - awful might happen 1 0 0 2  Total GAD 7 Score 17 11 7 11   Anxiety Difficulty Somewhat difficult   Somewhat difficult     Immunization History  Administered Date(s) Administered   Fluad Quad(high Dose 65+) 04/23/2022   Influenza,inj,Quad PF,6+ Mos 04/12/2014   Influenza-Unspecified 05/15/2015, 03/28/2018, 04/20/2020, 04/13/2021, 04/14/2023   PFIZER(Purple Top)SARS-COV-2 Vaccination 08/04/2019, 08/23/2019   PNEUMOCOCCAL CONJUGATE-20 05/29/2022   Td 07/15/2003   Tdap 01/11/2021   Zoster Recombinant(Shingrix) 04/27/2019, 09/26/2019  Past Medical History:  Diagnosis Date   Allergy    Anemia    after birth of child 74yrs ago   Anxiety and depression    with insomnia   Autoimmune disease (HCC) 08/06/2020   Blood transfusion without reported diagnosis    x3   Chronic back pain    ? RA, ? Lupus - reports saw rheumatologist in the past but better now, sees Dr. Dutch Quint   Eczema    Flushing 05/27/2021   GERD 02/13/2007   Qualifier: Diagnosis of  By: Jonny Ruiz MD, Len Blalock    GERD (gastroesophageal reflux disease)    takes Omeprazole daily, with nausea   Greater trochanteric bursitis of left hip 03/25/2019   Injected March 25, 2019   Heart murmur     Hemorrhoids    History of kidney stones    passed on her own   History of migraine    last one a yr ago and takes Zomig prn   Hot flashes 11/10/2014   HTN (hypertension) 03/31/2012   Hx of echocardiogram    Echo (8/15):  EF 55-60%, no RWMA   IBS (irritable bowel syndrome)    constipation predominant   Interstitial cystitis    hx of UTI   Irritable bowel syndrome 12/14/2009   Qualifier: Diagnosis of  By: Leone Payor MD, Charlyne Quale    Memory loss 12/22/2016   Numbness    both legs and related to back   Osteoporosis    osteopenia   Palpitations    on metoprolol in past but made BP too low   Pneumonia    hx of;last time 16yrs ago   PONV (postoperative nausea and vomiting)    laryngospasm-1999   Poor vision 10/15/2015   -? Ocular rosacea -sees opthomology    Raynaud's disease 10/21/2012   Raynaud's disease /phenomenon 10/21/2012   no meds, worse in the winter or cold environment.    Rheumatoid aortitis    uses Diclofenac gel;Rheumatoid   Seasonal allergies    Flonase daily    Strain of left piriformis muscle 05/18/2019   Urinary incontinence    Allergies  Allergen Reactions   Beef (Bovine) Protein Other (See Comments)   Aspartame And Phenylalanine Other (See Comments)    Migraines   Beef-Derived Products Other (See Comments)    severe cramping   Citrus Other (See Comments)    Causes Interstitial cystitis flares   Penicillins Hives and Rash    Denies airway involvement Has patient had a PCN reaction causing immediate rash, facial/tongue/throat swelling, SOB or lightheadedness with hypotension: yes hives.  Has patient had a PCN reaction causing severe rash involving mucus membranes or skin necrosis:no Has patient had a PCN reaction that required hospitalization No Has patient had a PCN reaction occurring within the last 10 years: No If all of the above answers are "NO", then may proceed with Cephalosporin use.    Promethazine Hcl Other (See Comments)    Muscle twitching  and contracture    Beta Vulgaris Other (See Comments)    beets   Bioflavonoids Other (See Comments)   Cat Hair Extract Other (See Comments)   Adhesive [Tape] Other (See Comments)    Red splotches   Morphine Sulfate Nausea And Vomiting   Past Surgical History:  Procedure Laterality Date   ABDOMINAL HYSTERECTOMY  1997   BREAST BIOPSY  1999   benign   COLONOSCOPY  02/11/2013 and 12/24/04   internal and external hemorrhoids   ENDOMETRIAL ABLATION  EXCISION MORTON'S NEUROMA Right    FLEXIBLE SIGMOIDOSCOPY  03/07/2008   internal and external hemorrhoids   POSTERIOR LUMBAR FUSION  05/02/2013   L4-L5 fusion (cage/screws/bone graft) Dr. Jordan Likes   TONSILLECTOMY     UPPER GASTROINTESTINAL ENDOSCOPY  10/23/2010   gastritis, irregular Z-line   wisdom teeth extracted     Family History  Problem Relation Age of Onset   Diverticulosis Mother    Hypertension Mother    Osteoarthritis Mother    Depression Mother    Hyperlipidemia Mother    Kidney disease Mother    Coronary artery disease Father    Heart attack Father    Hypertension Father    Depression Father    Hyperlipidemia Father    Alcohol abuse Father    Early death Father    Kidney disease Father    Mental illness Father    Alcohol abuse Sister    Depression Sister    Colon polyps Sister    Kidney disease Sister    Alcohol abuse Daughter    Depression Daughter    Drug abuse Daughter    Alcohol abuse Son    Depression Son    Diabetes Paternal Uncle    Hypertension Maternal Grandmother    Stroke Maternal Grandmother    Hypertension Maternal Grandfather    Pancreatic cancer Maternal Grandfather    Early death Maternal Grandfather    Depression Paternal Grandmother    Dementia Paternal Grandmother    Arthritis Paternal Grandmother    Mental illness Paternal Grandmother    Stroke Paternal Grandfather    Hyperlipidemia Paternal Grandfather    Alcohol abuse Paternal Grandfather    Early death Paternal Grandfather     Hypertension Paternal Grandfather    Colon cancer Neg Hx    Social History   Social History Narrative   Spiritual Beliefs: methodist   Marital status/children/pets: In second marriage.  First husband committed suicide.  2 children.   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   Safety:      -Wears a bicycle helmet riding a bike: Yes     -smoke alarm in the home:No     - wears seatbelt: Yes     - Feels safe in their relationships: Yes             Allergies as of 06/16/2023       Reactions   Beef (bovine) Protein Other (See Comments)   Aspartame And Phenylalanine Other (See Comments)   Migraines   Beef-derived Products Other (See Comments)   severe cramping   Citrus Other (See Comments)   Causes Interstitial cystitis flares   Penicillins Hives, Rash   Denies airway involvement Has patient had a PCN reaction causing immediate rash, facial/tongue/throat swelling, SOB or lightheadedness with hypotension: yes hives.  Has patient had a PCN reaction causing severe rash involving mucus membranes or skin necrosis:no Has patient had a PCN reaction that required hospitalization No Has patient had a PCN reaction occurring within the last 10 years: No If all of the above answers are "NO", then may proceed with Cephalosporin use.   Promethazine Hcl Other (See Comments)   Muscle twitching and contracture    Beta Vulgaris Other (See Comments)   beets   Bioflavonoids Other (See Comments)   Cat Hair Extract Other (See Comments)   Adhesive [tape] Other (See Comments)   Red splotches   Morphine Sulfate Nausea And Vomiting        Medication List  Accurate as of June 16, 2023 11:25 AM. If you have any questions, ask your nurse or doctor.          albuterol 108 (90 Base) MCG/ACT inhaler Commonly known as: VENTOLIN HFA Inhale 2 puffs 15 minutes before exercise and as needed for wheeze.   ALPRAZolam 0.5 MG tablet Commonly known as: Xanax Take 1 tablet (0.5 mg  total) by mouth 3 (three) times daily as needed for anxiety.   amLODipine 2.5 MG tablet Commonly known as: NORVASC Take 1 tablet (2.5 mg total) by mouth daily.   atenolol 25 MG tablet Commonly known as: TENORMIN Take 1 tablet (25 mg total) by mouth daily.   B-D 3CC LUER-LOK SYR 25GX1" 25G X 1" 3 ML Misc Generic drug: SYRINGE-NEEDLE (DISP) 3 ML Use as directed to inject b-12 into the skin   cyanocobalamin 1000 MCG/ML injection Commonly known as: VITAMIN B12 Inject 1 mL (1,000 mcg total) into the muscle every 14 (fourteen) days.   esomeprazole 40 MG capsule Commonly known as: NEXIUM Take by mouth.   ketoconazole 2 % shampoo Commonly known as: NIZORAL Use topically as directed for 30 days   lamoTRIgine 200 MG tablet Commonly known as: LAMICTAL Take 1 tablet (200 mg total) by mouth at bedtime.   lamoTRIgine 25 MG tablet Commonly known as: LaMICtal Take 2 tablets (50 mg total) by mouth daily.   meloxicam 15 MG tablet Commonly known as: MOBIC Take 1 tablet (15 mg total) by mouth daily for joint pain. What changed: Another medication with the same name was removed. Continue taking this medication, and follow the directions you see here. Changed by: Felix Pacini   omeprazole 40 MG capsule Commonly known as: PRILOSEC Take 1 capsule (40 mg total) by mouth daily.   Proctozone-HC 2.5 % rectal cream Generic drug: hydrocortisone Insert rectally 2 times daily for 10 days   QUEtiapine 50 MG tablet Commonly known as: SEROquel Take 1 tablet (50 mg total) by mouth at bedtime.        All past medical history, surgical history, allergies, family history, immunizations andmedications were updated in the EMR today and reviewed under the history and medication portions of their EMR.      ROS: 14 pt review of systems performed and negative (unless mentioned in an HPI)  Objective: BP 118/78   Pulse 67   Temp 98.1 F (36.7 C)   Wt 118 lb 3.2 oz (53.6 kg)   SpO2 98%   BMI  21.62 kg/m  Physical Exam Vitals and nursing note reviewed.  Constitutional:      General: She is not in acute distress.    Appearance: Normal appearance. She is not ill-appearing or toxic-appearing.  HENT:     Head: Normocephalic and atraumatic.     Right Ear: Tympanic membrane, ear canal and external ear normal. There is no impacted cerumen.     Left Ear: Tympanic membrane, ear canal and external ear normal. There is no impacted cerumen.     Nose: No congestion or rhinorrhea.     Mouth/Throat:     Mouth: Mucous membranes are moist.     Pharynx: Oropharynx is clear. No oropharyngeal exudate or posterior oropharyngeal erythema.  Eyes:     General: No scleral icterus.       Right eye: No discharge.        Left eye: No discharge.     Extraocular Movements: Extraocular movements intact.     Conjunctiva/sclera: Conjunctivae normal.     Pupils:  Pupils are equal, round, and reactive to light.  Cardiovascular:     Rate and Rhythm: Normal rate and regular rhythm.     Pulses: Normal pulses.     Heart sounds: Normal heart sounds. No murmur heard.    No friction rub. No gallop.  Pulmonary:     Effort: Pulmonary effort is normal. No respiratory distress.     Breath sounds: Normal breath sounds. No stridor. No wheezing, rhonchi or rales.  Chest:     Chest wall: No tenderness.  Abdominal:     General: Abdomen is flat. Bowel sounds are normal. There is no distension.     Palpations: Abdomen is soft. There is no mass.     Tenderness: There is no abdominal tenderness. There is no right CVA tenderness, left CVA tenderness, guarding or rebound.     Hernia: No hernia is present.  Musculoskeletal:        General: No swelling, tenderness or deformity. Normal range of motion.     Cervical back: Normal range of motion and neck supple. No rigidity or tenderness.     Right lower leg: No edema.     Left lower leg: No edema.  Lymphadenopathy:     Cervical: No cervical adenopathy.  Skin:    General:  Skin is warm and dry.     Coloration: Skin is not jaundiced or pale.     Findings: No bruising, erythema, lesion or rash.  Neurological:     General: No focal deficit present.     Mental Status: She is alert and oriented to person, place, and time. Mental status is at baseline.     Cranial Nerves: No cranial nerve deficit.     Sensory: No sensory deficit.     Motor: No weakness.     Coordination: Coordination normal.     Gait: Gait normal.     Deep Tendon Reflexes: Reflexes normal.  Psychiatric:        Mood and Affect: Mood normal.        Behavior: Behavior normal.        Thought Content: Thought content normal.        Judgment: Judgment normal.     No results found.  Assessment/plan: [name] is a 67 y.o. female present for CPE and Chronic Conditions/illness Management B12 deficiency Performs home B12 injections q. 14 days B12 collected today Heart palpitations Stable Continue atenolol 25 mg daily as needed  Raynaud's-elevated BP: Stable Continue amlodipine 2.5 mg daily as needed  Routine general medical examination at a health care facility Patient was encouraged to exercise greater than 150 minutes a week. Patient was encouraged to choose a diet filled with fresh fruits and vegetables, and lean meats. AVS provided to patient today for education/recommendation on gender specific health and safety maintenance. Colonoscopy: completed 04/03/2023, 10 yr follow up Dr. Leone Payor. Mammogram: 09/02/2022 [facility] for 08/2023 Immunizations: tdap UTD 2022, Influenza UTD (encouraged yearly),shingrix completed, covid series completed, PNA20 completed Infectious disease screening: Hep C completed DEXA: last completed 2020 at gyn  Return in about 1 year (around 06/16/2024) for cpe (20 min), Routine chronic condition follow-up.  Orders Placed This Encounter  Procedures   Comprehensive metabolic panel   Hemoglobin A1c   Lipid panel   B12 and Folate Panel    Meds ordered  this encounter  Medications   atenolol (TENORMIN) 25 MG tablet    Sig: Take 1 tablet (25 mg total) by mouth daily.    Dispense:  90  tablet    Refill:  3   cyanocobalamin (VITAMIN B12) 1000 MCG/ML injection    Sig: Inject 1 mL (1,000 mcg total) into the muscle every 14 (fourteen) days.    Dispense:  6 mL    Refill:  3   SYRINGE-NEEDLE, DISP, 3 ML (B-D 3CC LUER-LOK SYR 25GX1") 25G X 1" 3 ML MISC    Sig: Use as directed to inject b-12 into the skin    Dispense:  6 each    Refill:  3   amLODipine (NORVASC) 2.5 MG tablet    Sig: Take 1 tablet (2.5 mg total) by mouth daily.    Dispense:  90 tablet    Refill:  3    Referral Orders  No referral(s) requested today     Electronically signed by: Felix Pacini, DO [facility] [place]

## 2023-06-17 ENCOUNTER — Other Ambulatory Visit: Payer: Self-pay

## 2023-06-22 ENCOUNTER — Other Ambulatory Visit: Payer: Self-pay

## 2023-06-22 ENCOUNTER — Other Ambulatory Visit (HOSPITAL_COMMUNITY): Payer: Self-pay

## 2023-06-26 ENCOUNTER — Ambulatory Visit: Payer: Commercial Managed Care - PPO | Admitting: Psychiatry

## 2023-06-26 DIAGNOSIS — F411 Generalized anxiety disorder: Secondary | ICD-10-CM | POA: Diagnosis not present

## 2023-06-26 NOTE — Progress Notes (Signed)
[facility] Counselor/Therapist Progress Note  Patient ID: [name], MRN: [id number],    Date: 06/26/2023  Time Spent: 55 minutes   Treatment Type: Individual Therapy  Reported Symptoms: anxiety, depression, irritability, anger within family and work   Mental Status Exam:  Appearance:   Casual and Neat     Behavior:  Appropriate, Sharing, and Motivated  Motor:  Normal  Speech/Language:   Clear and Coherent  Affect:  Depressed and anxious  Mood:  anxious and depressed  Thought process:  goal directed  Thought content:    Rumination  Sensory/Perceptual disturbances:    WNL  Orientation:  oriented to person, place, time/date, situation, day of week, month of year, year, and stated date of Dec. 13, 2024  Attention:  Good  Concentration:  Good  Memory:  WNL  Fund of knowledge:   Good  Insight:    Good  Judgment:   Good  Impulse Control:  Good   Risk Assessment: Danger to Self:  No Self-injurious Behavior: No Danger to Others: No Duty to Warn:no Physical Aggression / Violence:No  Access to Firearms a concern: No  Gang Involvement:No   Subjective:   Patient called to get an earlier appointment this week and came in today reporting some recent increase in her anxiety, irritability, and depression, along with some anger in her family and work.  Denies any SI. Did visit sister in [place] and had good visit "but part of conversation revolved around issues with patient's mother." Patient did call mother from sister's home, and reports it was a "pressured conversation" as their interpersonal issues continue based years of difficulty. Stated to mom her preferences and conflict followed. Trying to manage her frustration, anger, anxiety, and depression in healthier ways but feels husband gets in the way of that. Tearfully expressing her frustration and very hard to look at any of the situations any differently, even though there are some positives and she does have some  choices.  Was able to eventually hear those choices and talk about them more openly.  Further processed anger, irritability, frustration, with patient today.  She did eventually become calm, not tearful, and stated she would follow through on some recommendations for the weekend involving some different choices that might be more helpful for her and being able to set healthier limits, refraining from only looking for the negatives.  Encouraged her to also use more positive self talk as that has been helpful in the past, and practice more self calming behaviors, and try not to assume she knows what everyone in the family is thinking/feeling and that it is negative.  Also encouraged patient to have some particular time with her pet dogs which are very therapeutic for her.  To try and not make negative assumptions about others nor herself.  Interventions: Cognitive Behavioral Therapy and Ego-Supportive  Long term goal: Develop healthy cognitive patterns and beliefs about self and the world that lead to alleviation of depression and anxiety and help prevent relapse.  Progressing: Patient is motivated. Struggling to focus some due to immediate, unexpected health concern with husband.  Short term goal: Learn and implement personal skills for managing stress, solving daily problems, and resolving conflicts effectively. Strategy: Teach patient calming skills, problem solving skills, and conflict resolution skills, to better manage daily stressors   Diagnosis:   ICD-10-CM   1. Generalized anxiety disorder  F41.1      Plan:   Patient today in session and working further on her  irritability, anxiety, depression, and anger issues some within her work and some within her family.  Been having a more difficult time recently in her marriage, at work, and with other people.  Continues to deny any SI.  She has made some progress on some of her goals and has recently been on FMLA at her job which she feels can help  with her stress.  She needs to continue working to progress further as she works with goal-directed behaviors in order to move in a healthier and more hopeful direction.  Encouraged patient in her practice of more positive and self affirming behaviors as noted in sessions including: Exercising on her bike to help deal with stress, taking her medications as prescribed, taking breaks as needed throughout the day to experience more calmness, use of deep breathing exercises, setting limits to better manage her stress, making healthy sleep patterns a priority each night, stay in touch with supportive people, work on letting go of negativity and over worrying in order to see more positive options for herself, healthy boundaries with others, and recognize the strength she shows working with goal-directed behaviors to move in a direction that supports her improved emotional health and her outlook into her future.  Goal review and progress/challenges noted with patient.  Next appointment within 2 to 3 weeks.   Mathis Fare, LCSW

## 2023-06-29 ENCOUNTER — Ambulatory Visit: Payer: Commercial Managed Care - PPO | Admitting: Psychiatry

## 2023-06-29 DIAGNOSIS — F411 Generalized anxiety disorder: Secondary | ICD-10-CM

## 2023-06-29 NOTE — Progress Notes (Signed)
[facility] Counselor/Therapist Progress Note  Patient ID: KM WEATHERBEE, MRN: [id number],    Date: 06/29/2023  Time Spent: 53 minutes   Treatment Type: Individual Therapy  Reported Symptoms:  anxiety, depression improving some, anger within family and work    Mental Status Exam:  Appearance:   Casual     Behavior:  Appropriate, Sharing, and Motivated  Motor:  Normal  Speech/Language:   Clear and Coherent  Affect:  Depressed, anxious  Mood:  anxious and depressed  Thought process:  goal directed  Thought content:    WNL  Sensory/Perceptual disturbances:    WNL  Orientation:  oriented to person, place, time/date, situation, day of week, month of year, year, and stated date of Dec. 16, 2024  Attention:  Good  Concentration:  Good  Memory:  WNL  Fund of knowledge:   Good  Insight:    Good and Fair  Judgment:   Good  Impulse Control:  Fair   Risk Assessment: Danger to Self:  No Self-injurious Behavior: No Danger to Others: No Duty to Warn:no Physical Aggression / Violence:No  Access to Firearms a concern: No  Gang Involvement:No   Subjective:  Patient in for session today after having more accelerated symptoms last week.  Today she reports depression has improved some, sleep improved some, anxiety remains, along with anger within work and family. Still angry about being "conned by my husband", "anger about guilt about not inviting my mother to Christmas", "anxiety about Medicare enrollment", "anxiety stirred up by holidays, anger with mother-in-law." Seems better today in mood and energy, but patient states she's not much better. Encouraged her to remain on her meds til she sees her med provider again soon. Very hard to see positives at times and we worked more consistently on that today, looking more at changes she is needing to work on for her situation to improve. Difficult feelings of anger and resentment within the family discussed along with strategies to help  patient take more responsibility for her moods and feelings and how to tolerate differences more (including people, situations, and within family). Showing more strength by session end.   Interventions: Cognitive Behavioral Therapy and Ego-Supportive  Long term goal: Develop healthy cognitive patterns and beliefs about self and the world that lead to alleviation of depression and anxiety and help prevent relapse.  Progressing: Patient is motivated. Struggling to focus some due to immediate, unexpected health concern with husband.  Short term goal: Learn and implement personal skills for managing stress, solving daily problems, and resolving conflicts effectively. Strategy: Teach patient calming skills, problem solving skills, and conflict resolution skills, to better manage daily stressors  Diagnosis:   ICD-10-CM   1. Generalized anxiety disorder  F41.1      Plan:  Patient today in session continuing her work on anxiety, depression, anger, irritability, and work issues.  Past couple weeks has been more stressful for her emotionally and in her trying to better manage stressors at work and home.  Denies any SI.  Has made some progress on goals and recently decided to ask for FMLA for her job and was granted this, which includes her working less hours each week.  Patient today in session and working further on her irritability, anxiety, depression, and anger issues some within her work and some within her family. Been having a more difficult time recently in her marriage, at work, and with other people. Continues to deny any SI. She has made some progress on  some of her goals and has recently been on FMLA at her job which she feels can help with her stress. She has shown some progress and needs to continue working with goal-directed behaviors in order to move in a healthier and more hopeful direction.  Encouraged patient in practicing more positive and self affirming behaviors as noted in sessions  including: Exercising on her bike to help deal with stress, taking her medications as prescribed, taking breaks as needed throughout the day to experience more calmness, use of deep breathing exercises, setting limits to better manage her stress, making healthy sleep patterns a priority, stay in touch with supportive people, work on letting go of negativity and over worrying in order to see more positive options for herself, healthy boundaries with others, and realize the strengths she shows working with goal-directed behaviors to move in a direction that supports her improved emotional health and her overall wellbeing.   Goal review and progress/challenges noted with patient.  Next appointment within 2 to 3 weeks.   Mathis Fare, LCSW

## 2023-07-09 ENCOUNTER — Ambulatory Visit (HOSPITAL_COMMUNITY)
Admission: EM | Admit: 2023-07-09 | Discharge: 2023-07-09 | Disposition: A | Discharge status: Home / Self Care | Payer: Commercial Managed Care - PPO | Attending: Internal Medicine | Admitting: Internal Medicine

## 2023-07-09 ENCOUNTER — Ambulatory Visit (INDEPENDENT_AMBULATORY_CARE_PROVIDER_SITE_OTHER): Payer: Commercial Managed Care - PPO

## 2023-07-09 ENCOUNTER — Ambulatory Visit: Payer: Self-pay | Admitting: Family Medicine

## 2023-07-09 ENCOUNTER — Encounter (HOSPITAL_COMMUNITY): Payer: Self-pay | Admitting: Emergency Medicine

## 2023-07-09 DIAGNOSIS — S60222A Contusion of left hand, initial encounter: Secondary | ICD-10-CM

## 2023-07-09 DIAGNOSIS — S0083XA Contusion of other part of head, initial encounter: Secondary | ICD-10-CM | POA: Diagnosis not present

## 2023-07-09 DIAGNOSIS — M79642 Pain in left hand: Secondary | ICD-10-CM | POA: Diagnosis not present

## 2023-07-09 NOTE — ED Triage Notes (Addendum)
Pt presents after a fall on Monday. C/o left hand pain and swelling.   She also hit face this morning and has some bruising.

## 2023-07-09 NOTE — Telephone Encounter (Signed)
Copied from CRM [id number]. Topic: Clinical - Red Word Triage >> Jul 09, 2023  9:46 AM Hector Shade B wrote: [facility] that prompted transfer to Nurse Triage: patient states that she fell and she thinks she cracked something in her hand theres a lot of pain and needs to get an xray   Chief Complaint: Fall/Hand injury Symptoms: Swelling of left hand Frequency: Single fall Pertinent Negatives: Patient denies other symptoms.  Disposition: [] ED /[x] Urgent Care (no appt availability in office) / [] Appointment(In office/virtual)/ []  [facility]/ [] Home Care/ [] Refused Recommended Disposition /[] [place] [facility]/ []  Follow-up with PCP Additional Notes: Patient reports she fell while roller skating 3 days ago. She states that she injured her left hand when she fell and states there is swelling to the hand and is concerned she may have broken a bone. She reports she has limited use of her hand due to swelling and pain. Patient advised to go to urgent care for imaging of hand. Patient understood and is agreeable with this plan.     Reason for Disposition  [1] Large swelling or bruise (> 2 inches or 5 cm) AND [2] can't use injured hand normally (e.g., make a fist, open fully, hold a glass of water)  Answer Assessment - Initial Assessment Questions 1. MECHANISM: "How did the fall happen?"     Roller skating  2. DOMESTIC VIOLENCE AND ELDER ABUSE SCREENING: "Did you fall because someone pushed you or tried to hurt you?" If Yes, ask: "Are you safe now?"     No 3. ONSET: "When did the fall happen?" (e.g., minutes, hours, or days ago)     3 days ago 4. LOCATION: "What part of the body hit the ground?" (e.g., back, buttocks, head, hips, knees, hands, head, stomach)     Left hand 5. INJURY: "Did you hurt (injure) yourself when you fell?" If Yes, ask: "What did you injure? Tell me more about this?" (e.g., body area; type of injury; pain severity)"     Yes, left hand 6. PAIN: "Is there any pain?" If  Yes, ask: "How bad is the pain?" (e.g., Scale 1-10; or mild,  moderate, severe)   - NONE (0): No pain   - MILD (1-3): Doesn't interfere with normal activities    - MODERATE (4-7): Interferes with normal activities or awakens from sleep    - SEVERE (8-10): Excruciating pain, unable to do any normal activities      0/10 7. SIZE: For cuts, bruises, or swelling, ask: "How large is it?" (e.g., inches or centimeters)      Swollen back of left hand "can't see the tendons or my knuckles" 8. PREGNANCY: "Is there any chance you are pregnant?" "When was your last menstrual period?"     No 9. OTHER SYMPTOMS: "Do you have any other symptoms?" (e.g., dizziness, fever, weakness; new onset or worsening).      Hit cheek on table trying to get up 10. CAUSE: "What do you think caused the fall (or falling)?" (e.g., tripped, dizzy spell)       Fell while roller skating and slipped  Answer Assessment - Initial Assessment Questions 1. MECHANISM: "How did the injury happen?"     Fall 2. ONSET: "When did the injury happen?" (Minutes or hours ago)      3 days 3. APPEARANCE of INJURY: "What does the injury look like?"      Swelling of the left hand 4. SEVERITY: "Can you use the hand normally?" "Can you bend  your fingers into a ball and then fully open them?"     Unable to use hand normally 5. SIZE: For cuts, bruises, or swelling, ask: "How large is it?" (e.g., inches or centimeters;  entire hand or wrist)      Swelling, unable to see tendons or knuckles.  6. PAIN: "Is there pain?" If Yes, ask: "How bad is the pain?"  (Scale 1-10; or mild, moderate, severe)     No pain now 7. TETANUS: For any breaks in the skin, ask: "When was the last tetanus booster?"     N/A 8. OTHER SYMPTOMS: "Do you have any other symptoms?"      Hit cheek when getting up from fall 9. PREGNANCY: "Is there any chance you are pregnant?" "When was your last menstrual period?"     No  Protocols used: Falls and Falling-A-AH, Hand and Wrist  Injury-A-AH

## 2023-07-09 NOTE — Telephone Encounter (Signed)
Pt went to [facility]

## 2023-07-09 NOTE — ED Provider Notes (Signed)
[facility]    CSN: [id number] Arrival date & time: 07/09/23  1117      History   Chief Complaint Chief Complaint  Patient presents with   Hand Pain    HPI [name] is a 67 y.o. female.   Patient presents today for left hand pain after she fell on the side of her hand 4 days ago while rollerblading.  Reports while putting on roller skates, she lifted her head too fast and hit her face against a counter and also has some facial bruising.  Reports that hand is swollen and bruised and only painful when she makes a fist.  Has been wearing a compression wrap and wrist brace which seems to help a little bit.    Past Medical History:  Diagnosis Date   Allergy    Anemia    after birth of child 67yrs ago   Anxiety and depression    with insomnia   Autoimmune disease (HCC) 08/06/2020   Blood transfusion without reported diagnosis    x3   Chronic back pain    ? RA, ? Lupus - reports saw rheumatologist in the past but better now, sees Dr. Dutch Quint   Eczema    Flushing 05/27/2021   GERD 02/13/2007   Qualifier: Diagnosis of  By: Jonny Ruiz MD, Len Blalock    GERD (gastroesophageal reflux disease)    takes Omeprazole daily, with nausea   Greater trochanteric bursitis of left hip 03/25/2019   Injected March 25, 2019   Heart murmur    Hemorrhoids    History of kidney stones    passed on her own   History of migraine    last one a yr ago and takes Zomig prn   Hot flashes 11/10/2014   HTN (hypertension) 03/31/2012   Hx of echocardiogram    Echo (8/15):  EF 55-60%, no RWMA   IBS (irritable bowel syndrome)    constipation predominant   Interstitial cystitis    hx of UTI   Irritable bowel syndrome 12/14/2009   Qualifier: Diagnosis of  By: Leone Payor MD, Charlyne Quale    Memory loss 12/22/2016   Numbness    both legs and related to back   Osteoporosis    osteopenia   Palpitations    on metoprolol in past but made BP too low   Pneumonia    hx of;last time 12yrs ago    PONV (postoperative nausea and vomiting)    laryngospasm-1999   Poor vision 10/15/2015   -? Ocular rosacea -sees opthomology    Raynaud's disease 10/21/2012   Raynaud's disease /phenomenon 10/21/2012   no meds, worse in the winter or cold environment.    Rheumatoid aortitis    uses Diclofenac gel;Rheumatoid   Seasonal allergies    Flonase daily    Strain of left piriformis muscle 05/18/2019   Urinary incontinence     Patient Active Problem List   Diagnosis Date Noted   Primary osteoarthritis of both first carpometacarpal joints 06/16/2023   Primary osteoarthritis 06/16/2023   Mass of upper outer quadrant of left breast 05/29/2022   Primary hypertension 02/14/2022   Encounter for long-term current use of medication 05/27/2021   Autoimmune disease (HCC) 08/06/2020   Arthritis of carpometacarpal (CMC) joint of right thumb 05/08/2020   Cervical spondylosis 12/19/2019   B12 deficiency 11/03/2019   Status post lumbar spinal fusion 08/03/2019   Spondylosis without myelopathy or radiculopathy, lumbar region 08/03/2019   DDD (degenerative disc disease), lumbar 05/18/2019  Panic disorder 01/05/2019   Sleep disturbance 12/29/2018   Eczema 12/29/2018   PTSD (post-traumatic stress disorder) 05/30/2018   Recurrent major depression in partial remission (HCC) 11/10/2014   Hot flashes 11/10/2014   Carpal tunnel syndrome 08/24/2013   Spinal stenosis, lumbar region, 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 Jordan Likes    TONSILLECTOMY     UPPER GASTROINTESTINAL ENDOSCOPY  10/23/2010   gastritis, irregular Z-line   wisdom teeth extracted      OB History     Gravida  2   Para  2   Term      Preterm      AB      Living  2      SAB      IAB      Ectopic      Multiple      Live Births               Home Medications    Prior to Admission medications   Medication Sig Start Date End Date Taking? Authorizing Provider  albuterol (VENTOLIN HFA) 108 (90 Base) MCG/ACT inhaler Inhale 2 puffs 15 minutes before exercise and as needed for wheeze. 09/08/22   Sheliah Hatch, MD  ALPRAZolam Prudy Feeler) 0.5 MG tablet Take 1 tablet (0.5 mg total) by mouth 3 (three) times daily as needed for anxiety. 03/12/23   Mozingo, Thereasa Solo, NP  amLODipine (NORVASC) 2.5 MG tablet Take 1 tablet (2.5 mg total) by mouth daily. 06/16/23   Kuneff, Renee A, DO  atenolol (TENORMIN) 25 MG tablet Take 1 tablet (25 mg total) by mouth daily. 06/16/23   Kuneff, Renee A, DO  cyanocobalamin (VITAMIN B12) 1000 MCG/ML injection Inject 1 mL (1,000 mcg total) into the muscle every 14 (fourteen) days. 06/16/23   Kuneff, Renee A, DO  esomeprazole (NEXIUM) 40 MG capsule Take by mouth.    [provider]  hydrocortisone (ANUSOL-HC) 2.5 % rectal cream Insert rectally 2 times daily for 10 days 12/05/21   Iva Boop, MD  ketoconazole (NIZORAL) 2 % shampoo Use topically as directed for 30 days 08/27/22     lamoTRIgine (LAMICTAL) 200 MG tablet Take 1 tablet (200 mg total) by mouth at bedtime. 03/12/23   Mozingo, Thereasa Solo, NP  lamoTRIgine (LAMICTAL) 25 MG tablet Take 2 tablets (50 mg total) by mouth daily. 05/01/23   Mozingo, Thereasa Solo, NP  meloxicam (MOBIC) 15 MG tablet Take 1 tablet (15 mg total) by mouth daily for joint pain. 05/26/23     omeprazole (PRILOSEC) 40 MG capsule Take 1 capsule (40 mg total) by mouth daily. 03/11/23   Iva Boop, MD  QUEtiapine (SEROQUEL) 50 MG tablet Take 1 tablet (50 mg total) by  mouth at bedtime. 03/12/23   Mozingo, Thereasa Solo, NP  SYRINGE-NEEDLE, DISP, 3 ML (B-D 3CC LUER-LOK SYR 25GX1") 25G X 1" 3 ML MISC Use as directed to inject b-12 into the skin 06/16/23   Claiborne Billings, Ezequiel Essex, DO    Family History  Family History  Problem Relation Age of Onset   Diverticulosis Mother    Hypertension Mother    Osteoarthritis Mother    Depression Mother    Hyperlipidemia Mother    Kidney disease Mother    Coronary artery disease Father    Heart attack Father    Hypertension Father    Depression Father    Hyperlipidemia Father    Alcohol abuse Father    Early death Father    Kidney disease Father    Mental illness Father    Alcohol abuse Sister    Depression Sister    Colon polyps Sister    Kidney disease Sister    Alcohol abuse Daughter    Depression Daughter    Drug abuse Daughter    Alcohol abuse Son    Depression Son    Diabetes Paternal Uncle    Hypertension Maternal Grandmother    Stroke Maternal Grandmother    Hypertension Maternal Grandfather    Pancreatic cancer Maternal Grandfather    Early death Maternal Grandfather    Depression Paternal Grandmother    Dementia Paternal Grandmother    Arthritis Paternal Grandmother    Mental illness Paternal Grandmother    Stroke Paternal Grandfather    Hyperlipidemia Paternal Grandfather    Alcohol abuse Paternal Grandfather    Early death Paternal Grandfather    Hypertension Paternal Grandfather    Colon cancer Neg Hx     Social History Social History   Tobacco Use   Smoking status: Never   Smokeless tobacco: Never  Vaping Use   Vaping status: Never Used  Substance Use Topics   Alcohol use: Yes    Alcohol/week: 2.0 standard drinks of alcohol    Types: 1 Shots of liquor, 1 Standard drinks or equivalent per week    Comment: one margarita a week   Drug use: No     Allergies   Beef (bovine) protein, Aspartame and phenylalanine, Beef-derived drug products, Citrus, Penicillins, Promethazine hcl, Beta  vulgaris, Bioflavonoids, Cat hair extract, Adhesive [tape], and Morphine sulfate   Review of Systems Review of Systems Per HPI  Physical Exam Triage Vital Signs ED Triage Vitals  Encounter Vitals Group     BP 07/09/23 1155 (!) 177/96     Systolic BP Percentile --      Diastolic BP Percentile --      Pulse Rate 07/09/23 1155 60     Resp 07/09/23 1155 16     Temp 07/09/23 1155 (!) 97.5 F (36.4 C)     Temp Source 07/09/23 1155 Oral     SpO2 07/09/23 1155 98 %     Weight --      Height --      Head Circumference --      Peak Flow --      Pain Score 07/09/23 1154 0     Pain Loc --      Pain Education --      Exclude from Growth Chart --    No data found.  Updated Vital Signs BP (!) 177/96 (BP Location: Right Arm)   Pulse 60   Temp (!) 97.5 F (36.4 C) (Oral)   Resp 16   SpO2 98%   Visual Acuity Right Eye Distance:   Left Eye Distance:   Bilateral Distance:    Right Eye Near:   Left Eye Near:    Bilateral Near:     Physical Exam Vitals and nursing note reviewed.  Constitutional:  General: She is not in acute distress.    Appearance: Normal appearance. She is not toxic-appearing.  HENT:     Head:     Comments: Light yellow color bruising noted.  To left orbit.  No tenderness to touch.  No pain with extraocular movements.    Mouth/Throat:     Mouth: Mucous membranes are moist.     Pharynx: Oropharynx is clear.  Pulmonary:     Effort: Pulmonary effort is normal. No respiratory distress.  Musculoskeletal:     Comments: Inspection: Mild swelling and bruising noted to dorsal and palmar aspect of left hand; no obvious deformity, redness  Palpation: Left hand diffusely nontender to palpation; no obvious deformities palpated ROM: Full ROM to left hand, although painful with making a closed fist Strength: 5/5 bilateral upper extremities Neurovascular: neurovascularly intact in bilateral upper extremities  Skin:    General: Skin is warm and dry.     Capillary  Refill: Capillary refill takes less than 2 seconds.     Coloration: Skin is not jaundiced or pale.     Findings: No erythema.  Neurological:     Mental Status: She is alert and oriented to person, place, and time.  Psychiatric:        Behavior: Behavior is cooperative.      UC Treatments / Results  Labs (all labs ordered are listed, but only abnormal results are displayed) Labs Reviewed - No data to display  EKG   Radiology DG Hand Complete Left Result Date: 07/09/2023 CLINICAL DATA:  Fall, left hand pain EXAM: LEFT HAND - COMPLETE 3+ VIEW COMPARISON:  04/22/2023 FINDINGS: There is no evidence of fracture or dislocation. Similar degenerative changes. Mild soft tissue swelling over the dorsum of the hand. IMPRESSION: Mild soft tissue swelling over the dorsum of the hand. No acute fracture or dislocation. Electronically Signed   By: Duanne Guess D.O.   On: 07/09/2023 12:23    Procedures Procedures (including critical care time)  Medications Ordered in UC Medications - No data to display  Initial Impression / Assessment and Plan / UC Course  I have reviewed the triage vital signs and the nursing notes.  Pertinent labs & imaging results that were available during my care of the patient were reviewed by me and considered in my medical decision making (see chart for details).   In triage, patient is mildly hypertensive, otherwise vital signs are stable.  1. Contusion of left hand, initial encounter 2. Contusion of face, initial encounter An x-ray is negative for acute bony abnormality Reassurance provided Recommended rest, ice, compression, elevation Can take Tylenol as needed for pain ER and return precautions discussed with patient She has an engagement ring and wedding band on the left fourth digit and is unable to take it off currently due to swelling, however there is no discoloration to the left fourth digit and the fourth digit is neurovascularly intact I offer to  cut the rings off today, patient declines Recommended seeking emergent care if there are any skin color changes or she is unable to feel the tip of her left fourth digit  The patient was given the opportunity to ask questions.  All questions answered to their satisfaction.  The patient is in agreement to this plan.   Final Clinical Impressions(s) / UC Diagnoses   Final diagnoses:  Contusion of left hand, initial encounter  Contusion of face, initial encounter     Discharge Instructions      X-ray right hand today  is negative for any broken bones.  Recommend keeping your hand in a compression wrap, keeping it elevated above the level of your heart, and applying ice 15 minutes on 45 minutes off every hour while awake.  You can take Tylenol as needed for pain.  Seek care if symptoms do not improve with treatment.   ED Prescriptions   None    PDMP not reviewed this encounter.   Valentino Nose, NP 07/09/23 [phone]

## 2023-07-09 NOTE — Discharge Instructions (Signed)
X-ray right hand today is negative for any broken bones.  Recommend keeping your hand in a compression wrap, keeping it elevated above the level of your heart, and applying ice 15 minutes on 45 minutes off every hour while awake.  You can take Tylenol as needed for pain.  Seek care if symptoms do not improve with treatment.

## 2023-07-13 ENCOUNTER — Ambulatory Visit: Payer: Commercial Managed Care - PPO | Admitting: Adult Health

## 2023-07-16 ENCOUNTER — Ambulatory Visit (INDEPENDENT_AMBULATORY_CARE_PROVIDER_SITE_OTHER): Payer: Self-pay | Admitting: Adult Health

## 2023-07-16 DIAGNOSIS — Z0389 Encounter for observation for other suspected diseases and conditions ruled out: Secondary | ICD-10-CM

## 2023-07-16 NOTE — Progress Notes (Signed)
 Patient no show appointment. ? ?

## 2023-07-21 ENCOUNTER — Ambulatory Visit: Payer: Commercial Managed Care - PPO | Admitting: Adult Health

## 2023-07-21 ENCOUNTER — Other Ambulatory Visit (HOSPITAL_COMMUNITY): Payer: Self-pay

## 2023-07-22 ENCOUNTER — Ambulatory Visit: Payer: Commercial Managed Care - PPO | Admitting: Psychiatry

## 2023-07-23 ENCOUNTER — Other Ambulatory Visit: Payer: Self-pay

## 2023-07-23 ENCOUNTER — Other Ambulatory Visit (HOSPITAL_BASED_OUTPATIENT_CLINIC_OR_DEPARTMENT_OTHER): Payer: Self-pay

## 2023-07-23 MED ORDER — OMEPRAZOLE 40 MG PO CPDR
40.0000 mg | DELAYED_RELEASE_CAPSULE | Freq: Every day | ORAL | 3 refills | Status: AC
  Filled 2023-07-23: qty 90, 90d supply, fill #0

## 2023-07-23 NOTE — Telephone Encounter (Signed)
 Omeprazole refilled as requested. Up to date on her office visits.

## 2023-08-05 ENCOUNTER — Ambulatory Visit: Payer: Commercial Managed Care - PPO | Admitting: Psychiatry

## 2023-08-06 ENCOUNTER — Ambulatory Visit: Payer: Commercial Managed Care - PPO | Admitting: Psychiatry

## 2023-08-12 ENCOUNTER — Other Ambulatory Visit (HOSPITAL_COMMUNITY): Payer: Self-pay

## 2023-08-12 ENCOUNTER — Other Ambulatory Visit (HOSPITAL_BASED_OUTPATIENT_CLINIC_OR_DEPARTMENT_OTHER): Payer: Self-pay

## 2023-08-20 ENCOUNTER — Other Ambulatory Visit (HOSPITAL_COMMUNITY): Payer: Self-pay

## 2023-08-20 MED ORDER — PROGESTERONE MICRONIZED 100 MG PO CAPS
100.0000 mg | ORAL_CAPSULE | Freq: Every day | ORAL | 0 refills | Status: DC
  Filled 2023-08-20: qty 60, 30d supply, fill #0

## 2023-08-27 ENCOUNTER — Other Ambulatory Visit (HOSPITAL_COMMUNITY): Payer: Self-pay

## 2023-09-09 ENCOUNTER — Other Ambulatory Visit: Payer: Self-pay | Admitting: Family Medicine

## 2023-09-09 DIAGNOSIS — Z0000 Encounter for general adult medical examination without abnormal findings: Secondary | ICD-10-CM

## 2023-09-10 ENCOUNTER — Other Ambulatory Visit (HOSPITAL_COMMUNITY): Payer: Self-pay

## 2023-09-12 ENCOUNTER — Other Ambulatory Visit (HOSPITAL_COMMUNITY): Payer: Self-pay

## 2023-09-30 ENCOUNTER — Other Ambulatory Visit (HOSPITAL_COMMUNITY): Payer: Self-pay

## 2023-09-30 ENCOUNTER — Other Ambulatory Visit: Payer: Self-pay | Admitting: Adult Health

## 2023-09-30 DIAGNOSIS — G4700 Insomnia, unspecified: Secondary | ICD-10-CM

## 2023-09-30 NOTE — Telephone Encounter (Signed)
 Sent MyChart message to schedule FU. Was due in Dec. Has canceled appts with Almira Coaster and Deb.

## 2023-10-05 ENCOUNTER — Encounter: Payer: Self-pay | Admitting: Family Medicine

## 2023-10-05 ENCOUNTER — Ambulatory Visit (INDEPENDENT_AMBULATORY_CARE_PROVIDER_SITE_OTHER): Admitting: Family Medicine

## 2023-10-05 VITALS — BP 122/78 | HR 57 | Ht 62.0 in | Wt 118.0 lb

## 2023-10-05 DIAGNOSIS — S76311A Strain of muscle, fascia and tendon of the posterior muscle group at thigh level, right thigh, initial encounter: Secondary | ICD-10-CM | POA: Diagnosis not present

## 2023-10-05 NOTE — Progress Notes (Signed)
   I, Stevenson Clinch, CMA acting as a scribe for Clementeen Graham, MD.  [name] is a 68 y.o. female who presents to [facility] today for thigh pain. Pt was previously seen by Dr. Katrinka Blazing on 12/02/22 for bilat thumb pain  Today, pt c/o R thigh pain x  days. 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 Pt locates pain to posterior aspect of the right thigh. Was mountain biking on an incline. Has taken Advil with no relief. Some swelling in the area. Has not noticed bruising, increased warmth or erythema. Sx with with it-to-stand.   Treatments tried: Advil  Pertinent review of systems: No fevers or chills  Relevant historical information: Hypertension spondylosis lumbar spine   Exam:  BP 122/78   Pulse (!) 57   Ht 5\' 2"  (1.575 m)   Wt 118 lb (53.5 kg)   SpO2 100%   BMI 21.58 kg/m  General: Well Developed, well nourished, and in no acute distress.   MSK: Right hip normal-appearing Tender palpation at ischial tuberosity.  Normal hip motion.  Intact strength.  Pain with resisted knee flexion is present.  Negative H test. Intact strength.     Assessment and Plan: 68 y.o. female with right hamstring strain.  Plan for compression sleeve and eccentric exercises taught in clinic today prior to discharge.  Consider formal physical therapy.  Talked about activity modification and restriction.  Recheck as needed. If physical therapy needed patient lives in [place] so would use [facility] or [place].  PDMP not reviewed this encounter. No orders of the defined types were placed in this encounter.  No orders of the defined types were placed in this encounter.    Discussed warning signs or symptoms. Please see discharge instructions. Patient expresses understanding.   The above documentation has been reviewed and is accurate and complete Clementeen Graham, M.D.

## 2023-10-05 NOTE — Patient Instructions (Signed)
 Thank you for coming in today.   Please work on the home exercises the athletic trainer went over with you:  View at [email or web address]-exercise-[email or web address] using code [email or web address]  I recommend you obtained a compression sleeve to help with your joint problems. There are many options on the market however I recommend obtaining a Full Thigh Body Helix compression sleeve.  You can find information (including how to appropriate measure yourself for sizing) can be found at www.Body [email or web address].ch.  Many of these products are health savings account (HSA) eligible.   You can use the compression sleeve at any time throughout the day but is most important to use while being active as well as for 2 hours post-activity.   It is appropriate to ice following activity with the compression sleeve in place.   I can refer you to physical therapy, if not improving.  Check back as needed

## 2023-10-07 ENCOUNTER — Other Ambulatory Visit (HOSPITAL_COMMUNITY): Payer: Self-pay

## 2023-10-07 ENCOUNTER — Other Ambulatory Visit: Payer: Self-pay

## 2023-10-07 MED ORDER — PROGESTERONE MICRONIZED 100 MG PO CAPS
100.0000 mg | ORAL_CAPSULE | Freq: Every day | ORAL | 2 refills | Status: AC
Start: 2023-10-07 — End: ?
  Filled 2023-10-07: qty 60, 30d supply, fill #0
  Filled 2024-01-25: qty 60, 30d supply, fill #1

## 2023-10-10 ENCOUNTER — Other Ambulatory Visit (HOSPITAL_COMMUNITY): Payer: Self-pay

## 2023-10-14 ENCOUNTER — Telehealth: Payer: Self-pay | Admitting: Adult Health

## 2023-10-14 NOTE — Telephone Encounter (Signed)
 Next appt is 10/19/23. [name] is in [place] with her mom helping her move into an assisted living location. She forgot to bring her Xanax 10 mg with her. Could she get a refill at the following location.   [facility], [address], [place]. Phone number is:  [phone].

## 2023-10-15 ENCOUNTER — Other Ambulatory Visit: Payer: Self-pay

## 2023-10-15 ENCOUNTER — Other Ambulatory Visit: Payer: Self-pay | Admitting: Adult Health

## 2023-10-15 ENCOUNTER — Telehealth: Payer: Self-pay | Admitting: Adult Health

## 2023-10-15 DIAGNOSIS — F411 Generalized anxiety disorder: Secondary | ICD-10-CM

## 2023-10-15 DIAGNOSIS — G4700 Insomnia, unspecified: Secondary | ICD-10-CM

## 2023-10-15 MED ORDER — ALPRAZOLAM 0.5 MG PO TABS: 0.5000 mg | ORAL_TABLET | Freq: Three times a day (TID) | ORAL | 0 refills | Status: AC | PRN

## 2023-10-15 MED ORDER — QUETIAPINE FUMARATE 50 MG PO TABS
50.0000 mg | ORAL_TABLET | Freq: Every day | ORAL | 0 refills | Status: DC
Start: 2023-10-15 — End: 2023-10-19

## 2023-10-15 MED ORDER — ALPRAZOLAM 0.5 MG PO TABS
0.5000 mg | ORAL_TABLET | Freq: Three times a day (TID) | ORAL | 0 refills | Status: DC | PRN
  Filled 2023-10-15: qty 90, 30d supply, fill #0

## 2023-10-15 NOTE — Telephone Encounter (Signed)
 Sent in a 7-day supply of Seroquel to [facility] pharmacy.

## 2023-10-15 NOTE — Telephone Encounter (Signed)
 Next appt is 10/19/23. [name] is still in [place] putting her mother into an assisted living facility. She has called and said that she forgot to bring her Seroquel with her. She will be with her mom thru the weekend. She would like to have her Seroquel called in to: Pharmacy is:  [facility], [address] [place], [place]. Phone number is:   [phone].

## 2023-10-15 NOTE — Telephone Encounter (Signed)
Pended Xanax.

## 2023-10-15 NOTE — Telephone Encounter (Signed)
 Pt called again today stating that this is an emergency and she needs her xanax sent in

## 2023-10-19 ENCOUNTER — Telehealth: Payer: Self-pay | Admitting: Adult Health

## 2023-10-19 ENCOUNTER — Other Ambulatory Visit: Payer: Self-pay

## 2023-10-19 ENCOUNTER — Encounter: Payer: Self-pay | Admitting: Adult Health

## 2023-10-19 ENCOUNTER — Other Ambulatory Visit (HOSPITAL_COMMUNITY): Payer: Self-pay

## 2023-10-19 DIAGNOSIS — Z0289 Encounter for other administrative examinations: Secondary | ICD-10-CM

## 2023-10-19 MED ORDER — QUETIAPINE FUMARATE 50 MG PO TABS
50.0000 mg | ORAL_TABLET | Freq: Every day | ORAL | 0 refills | Status: DC
  Filled 2023-10-19: qty 90, 90d supply, fill #0

## 2023-10-19 NOTE — Progress Notes (Signed)
 Unable to speak with patient - currently in [place] with mother.

## 2023-10-28 ENCOUNTER — Ambulatory Visit
Admission: RE | Admit: 2023-10-28 | Discharge: 2023-10-28 | Disposition: A | Discharge status: Home / Self Care | Payer: Commercial Managed Care - PPO | Source: Ambulatory Visit | Attending: Family Medicine | Admitting: Family Medicine

## 2023-10-28 DIAGNOSIS — Z0000 Encounter for general adult medical examination without abnormal findings: Secondary | ICD-10-CM

## 2023-11-02 ENCOUNTER — Other Ambulatory Visit: Payer: Self-pay | Admitting: Family Medicine

## 2023-11-02 DIAGNOSIS — N6489 Other specified disorders of breast: Secondary | ICD-10-CM

## 2023-11-18 ENCOUNTER — Encounter: Payer: Self-pay | Admitting: Family Medicine

## 2023-11-18 ENCOUNTER — Ambulatory Visit
Admission: RE | Admit: 2023-11-18 | Discharge: 2023-11-18 | Disposition: A | Discharge status: Home / Self Care | Source: Ambulatory Visit | Attending: Family Medicine | Admitting: Family Medicine

## 2023-11-18 ENCOUNTER — Ambulatory Visit

## 2023-11-18 DIAGNOSIS — N6489 Other specified disorders of breast: Secondary | ICD-10-CM

## 2023-11-19 ENCOUNTER — Ambulatory Visit: Admitting: Psychiatry

## 2023-11-19 DIAGNOSIS — F411 Generalized anxiety disorder: Secondary | ICD-10-CM | POA: Diagnosis not present

## 2023-11-19 NOTE — Progress Notes (Signed)
 [facility] Counselor/Therapist Progress Note  Patient ID: [name], MRN: [id number],    Date: 11/19/2023  Time Spent: 53 minutes   Treatment Type: Individual Therapy  Reported Symptoms: anxiety, some depression, anger and frustration within family especially   Mental Status Exam:  Appearance:   Casual and Neat     Behavior:  Appropriate, Sharing, and Motivated  Motor:  Normal  Speech/Language:   Clear and Coherent  Affect:  Depressed and anxious  Mood:  angry, anxious, depressed, and irritable  Thought process:  goal directed  Thought content:    WNL  Sensory/Perceptual disturbances:    WNL  Orientation:  oriented to person, place, time/date, situation, day of week, month of year, year, and stated date of Nov 17, 2023  Attention:  Good  Concentration:  Good  Memory:  WNL  Fund of knowledge:   Good  Insight:    Good and Fair  Judgment:   Good  Impulse Control:  Good   Risk Assessment: Danger to Self:  No Self-injurious Behavior: No Danger to Others: No Duty to Warn:no Physical Aggression / Violence:No  Access to Firearms a concern: No  Gang Involvement:No   Subjective:   Patient today in session today after significant issues in relation to her 67 yr old mother and "clashing with" 69 yr old daughter. *(Not all details included in this note due to patient privacy needs.) Problems while on recent trip with family members. Patient upset and concerned over events that occurred while away on trip, especially involving adult daughter.Patient hurt by daughter's remarks and behavior. Patient's mother placed in facility where she her health issues can be addressed, but there have been significant problems with mother and her new facility.  Encouraged patient's venting of her anger, hurt, and frustration and she was much calmer by end of session.  Also looking at how she might be more proactive in trying to have more self-care in place on a regular basis as her mothers  situation has declined some and patient is experiencing additional stress.  Depression "some worse at times".  Anxiety high and we focused on some specific ways of her managing her anxiety in a healthier manner.  Anger issues not quite as sharp today.  Feelings of anger and resentment still existing within the family and hard for different family members to tolerate different moods and feelings of other family members.  (Not all details included in this note due to patient privacy needs.)  Interventions: Cognitive Behavioral Therapy, Solution-Oriented/Positive Psychology, and Ego-Supportive  Long term goal: Develop healthy cognitive patterns and beliefs about self and the world that lead to alleviation of depression and anxiety and help prevent relapse.  Progressing: Patient is motivated. Struggling to focus some due to immediate, unexpected health concern with husband.  Short term goal: Learn and implement personal skills for managing stress, solving daily problems, and resolving conflicts effectively. Strategy: Teach patient calming skills, problem solving skills, and conflict resolution skills, to better manage daily stressors   Diagnosis:   ICD-10-CM   1. Generalized anxiety disorder  F41.1      Plan:   Patient today in session focusing more on her anxiety, anger, irritability, and depression, especially within family relationships and certain changes in healthcare status and decisions being made.  Patient has reduced her hours at work and is much more satisfied with this arrangement.  Encouraged patient to be practicing more positive and self affirming behaviors as noted today in session including: Taking  her medications as prescribed, taking breaks as needed throughout the day to experience more calmness, use of deep breathing exercises, exercising on her bike to help deal with stress, skating at a local facility where she has picked back up a skill that she had several years ago, setting  limits to better manage her stress, making healthy sleep patterns a priority, stay in touch with supportive people, work on letting go of negativity and over worrying, healthy boundaries with others, and recognize the strength she can show when working with goal-directed behaviors to move in a direction that supports her overall improved emotional health and her outlook into the future.  Goal review and progress/challenges noted with patient.  Next appointment within 3 weeks.   Kelleen Patee, LCSW

## 2023-11-20 ENCOUNTER — Encounter

## 2023-11-20 ENCOUNTER — Telehealth: Payer: Self-pay | Admitting: Adult Health

## 2023-11-20 ENCOUNTER — Other Ambulatory Visit

## 2023-11-20 DIAGNOSIS — G4700 Insomnia, unspecified: Secondary | ICD-10-CM

## 2023-11-20 NOTE — Progress Notes (Signed)
 No charge.

## 2023-12-10 ENCOUNTER — Other Ambulatory Visit (HOSPITAL_COMMUNITY): Payer: Self-pay

## 2023-12-10 ENCOUNTER — Ambulatory Visit: Admitting: Psychiatry

## 2023-12-14 ENCOUNTER — Other Ambulatory Visit (HOSPITAL_COMMUNITY): Payer: Self-pay

## 2023-12-17 ENCOUNTER — Other Ambulatory Visit (HOSPITAL_COMMUNITY): Payer: Self-pay

## 2023-12-18 ENCOUNTER — Other Ambulatory Visit: Payer: Self-pay

## 2023-12-18 ENCOUNTER — Other Ambulatory Visit (HOSPITAL_COMMUNITY): Payer: Self-pay

## 2024-01-05 ENCOUNTER — Other Ambulatory Visit (HOSPITAL_COMMUNITY): Payer: Self-pay

## 2024-01-05 ENCOUNTER — Ambulatory Visit (INDEPENDENT_AMBULATORY_CARE_PROVIDER_SITE_OTHER): Admitting: Podiatry

## 2024-01-05 DIAGNOSIS — B351 Tinea unguium: Secondary | ICD-10-CM

## 2024-01-05 DIAGNOSIS — L603 Nail dystrophy: Secondary | ICD-10-CM | POA: Diagnosis not present

## 2024-01-05 DIAGNOSIS — M21619 Bunion of unspecified foot: Secondary | ICD-10-CM | POA: Diagnosis not present

## 2024-01-05 DIAGNOSIS — L84 Corns and callosities: Secondary | ICD-10-CM

## 2024-01-05 MED ORDER — TAVABOROLE 5 % EX SOLN: 1.0000 [drp] | Freq: Every day | CUTANEOUS | 2 refills | Status: AC

## 2024-01-05 NOTE — Progress Notes (Signed)
  Subjective:  Patient ID: [name], female    DOB: 26-Jun-1956,  MRN: [id number]  Chief Complaint  Patient presents with   Nail Problem    RM#12 left foot third toe nail coming off due to using smaller skates about three months ago and causing pain.    Discussed the use of AI scribe software for clinical note transcription with the patient, who gave verbal consent to proceed.  History of Present Illness [name] is a 68 year old female who presents with concerns about her left third toenail after trauma from skating.  Two months ago, she experienced trauma to her left third toenail from tight skating boots, resulting in bruising and eventual loss of the 4th digit nail. She treats the area with Epsom salt soaks, Neosporin, and a bandage. She is concerned about the 3rd toenail's appearance and potential recurrence.  She remains active in skating and cycling, attributing the initial injury to tight boots. She has acquired new skates but worries about recurrence. She takes biotin supplements, recently increasing to 10,000 mcg, to improve nail health. She uses a spacer for her bunions, and has calluses on the sides of her big toes.  There is no drainage or fluid from the toenail. She is concerned about calluses and bunions due to toe positioning and shoe pressure. She also has arthritis affecting her feet.      Objective:    Physical Exam General: AAO x3, NAD  Dermatological: Mild hyperkeratotic lesions are noted to the medial aspect of bilateral hallux without any underlying ulceration, drainage or signs of infection.  The left third digit nail is hypertrophic, dystrophic with there is clearing on the proximal one third of the toenail.  Some of the nails also have some yellow discoloration.  Vascular: Dorsalis Pedis artery and Posterior Tibial artery pedal pulses are 2/4 bilateral with immedate capillary fill time. There is no pain with calf compression, swelling, warmth,  erythema.   Neruologic: Grossly intact via light touch bilateral.  Musculoskeletal: No other areas of discomfort.  No areas of pinpoint tenderness.  Gait: Unassisted, Nonantalgic.     No images are attached to the encounter.    Results    Assessment:   1. Nail dystrophy   2. Bunion   3. Callus   4.      Onychomycosis    Plan:  Patient was evaluated and treated and all questions answered.  Assessment and Plan Assessment & Plan Injury to left third toenail Injury from tight skating boots with bruising and partial nail loss. New nail growth without infection signs. - Continue Epsom salt soaks and apply Neosporin. - Provide silicone toe cap for cushioning. - Sharply debride the nail without any complications or bleeding. -Consider urea nail gel to help with the thickening of the nails. - Monitor for infection signs.  Bunion Bunions causing callus formation due to altered toe positioning and shoe pressure. - Use toe spacers cautiously. - Moisturize calluses.  Onychomycosis Yellow toenail discoloration suggests onychomycosis. Antifungal treatment considered. - Order keratin antifungal medication. - Evaluate cost and coverage; contact provider if issues arise.   Return if symptoms worsen or fail to improve.   Donnice JONELLE Fees DPM

## 2024-01-05 NOTE — Patient Instructions (Signed)
 You can use UREA NAIL GEL on the thicker toenails  --  Tavaborole Topical solution What is this medication? TAVABOROLE (ta va BO role) treats fungal infections of the nails. It belongs to a group of medications called antifungals. It will not treat infections caused by bacteria or viruses. This medicine may be used for other purposes; ask your health care provider or pharmacist if you have questions. COMMON BRAND NAME(S): KERYDIN What should I tell my care team before I take this medication? They need to know if you have any of these conditions: An unusual or allergic reaction to tavaborole, other medications, foods, dyes, or preservatives Pregnant or trying to get pregnant Breast-feeding How should I use this medication? This medication is for external use only. Do not take by mouth. Wash your hands before and after use. If you are treating your hands, only wash your hands before use. Do not get it in your eyes. If you do, rinse your eyes with plenty of cool tap water. Use it as directed on the prescription label. Do not use it more often than directed. Use the medication for the full course as directed by your care team, even if you think you are better. Do not stop using it unless your care team tells you to stop it early. Apply a thin film of the medication to the affected area. Talk to your care team about the use of this medication in children. While it may be prescribed for children as young as 6 years for selected conditions, precautions do apply. Overdosage: If you think you have taken too much of this medicine contact a poison control center or emergency room at once. NOTE: This medicine is only for you. Do not share this medicine with others. What if I miss a dose? If you miss a dose, use it as soon as you can. If it is almost time for your next dose, use only that dose. Do not use double or extra doses. What may interact with this medication? Interactions have not been studied. Do  not use any other nail products (i.e., nail polish, pedicures) during treatment with this medication. This list may not describe all possible interactions. Give your health care provider a list of all the medicines, herbs, non-prescription drugs, or dietary supplements you use. Also tell them if you smoke, drink alcohol, or use illegal drugs. Some items may interact with your medicine. What should I watch for while using this medication? Visit your care team for regular checks on your progress. It may be some time before you see the benefit from this medication. After bathing, make sure your skin is very dry. Fungal infections like moist conditions. Do not walk around barefoot. To help prevent reinfection, wear freshly washed cotton, not synthetic, clothing. Tell your care team if you develop sores or blisters that do not heal properly. If your skin infection returns after you stop using this medication, contact your care team. What side effects may I notice from receiving this medication? Side effects that you should report to your care team as soon as possible: Allergic reactions--skin rash, itching, hives, swelling of the face, lips, tongue, or throat Burning, itching, crusting, or peeling of treated skin Side effects that usually do not require medical attention (report to your care team if they continue or are bothersome): Ingrown nails Mild skin irritation, redness, or dryness This list may not describe all possible side effects. Call your doctor for medical advice about side effects. You may report side  effects to FDA at 1-800-FDA-1088. Where should I keep my medication? Keep out of the reach of children and pets. Store at room temperature between 20 and 25 degrees C (68 and 77 degrees F). Keep this medication in the original container. Protect from moisture. Keep the container tightly closed. Avoid exposure to extreme heat. Get rid of any unused medication 3 months after opening. This  medication is flammable. Avoid exposure to heat, fire, flame, and smoking. To get rid of medications that are no longer needed or have expired: Take the medications to a medication take-back program. Check with your pharmacy or law enforcement to find a location. If you cannot return the medication, check the label or package insert to see if the medication should be thrown out in the garbage or flushed down the toilet. If you are not sure, ask your care team. If it is safe to put it in the trash, empty the medication out of the container. Mix the medication with cat litter, dirt, coffee grounds, or other unwanted substance. Seal the mixture in a bag or container. Put it in the trash. NOTE: This sheet is a summary. It may not cover all possible information. If you have questions about this medicine, talk to your doctor, pharmacist, or health care provider.  2024 Elsevier/Gold Standard (2021-10-17 00:00:00)

## 2024-01-06 ENCOUNTER — Other Ambulatory Visit: Payer: Self-pay

## 2024-01-13 ENCOUNTER — Other Ambulatory Visit (HOSPITAL_COMMUNITY): Payer: Self-pay

## 2024-01-18 ENCOUNTER — Other Ambulatory Visit: Payer: Self-pay | Admitting: Adult Health

## 2024-01-18 DIAGNOSIS — G4700 Insomnia, unspecified: Secondary | ICD-10-CM

## 2024-01-19 ENCOUNTER — Ambulatory Visit: Payer: Self-pay

## 2024-01-19 ENCOUNTER — Other Ambulatory Visit: Payer: Self-pay

## 2024-01-19 ENCOUNTER — Other Ambulatory Visit (HOSPITAL_COMMUNITY): Payer: Self-pay

## 2024-01-19 MED ORDER — QUETIAPINE FUMARATE 50 MG PO TABS
50.0000 mg | ORAL_TABLET | Freq: Every day | ORAL | 0 refills | Status: DC
Start: 2024-01-19 — End: 2024-02-23
  Filled 2024-01-19: qty 30, 30d supply, fill #0

## 2024-01-19 NOTE — Telephone Encounter (Signed)
 Please call to schedule FU

## 2024-01-19 NOTE — Telephone Encounter (Signed)
 Spoke with patient and scheduled for appt

## 2024-01-19 NOTE — Telephone Encounter (Signed)
 FYI Only or Action Required?: Action required by provider: request for appointment.  Patient was last seen in primary care on 06/16/2023 by Catherine Fuller A, DO.  Called Nurse Triage reporting Urinary Frequency.  Symptoms began 2 weeks ago.  Interventions attempted: Rest, hydration, or home remedies.  Symptoms are: unchanged.  Triage Disposition: See Physician Within 24 Hours-availability concerns-needs a phone call by office.   Patient/caregiver understands and will follow disposition?: No, wishes to speak with PCP  Copied from CRM [id number]. Topic: Clinical - Red Word Triage >> Jan 19, 2024 10:53 AM [place] A wrote: [facility] that prompted transfer to Nurse Triage: Patient is having pain,frequent urination for about two weeks-said it is getting worse Reason for Disposition  Urinating more frequently than usual (i.e., frequency)  Answer Assessment - Initial Assessment Questions 1. SYMPTOM: What's the main symptom you're concerned about? (e.g., frequency, incontinence)     Urinary frequency 2. ONSET: When did the  urinary frequency  start?     Started 2 weeks ago 3. PAIN: Is there any pain? If Yes, ask: How bad is it? (Scale: 1-10; mild, moderate, severe)     Yes-pain is 1 out of 10 4. CAUSE: What do you think is causing the symptoms?     Unsure if this is a UTI-patient reports A1C is elevated.  5. OTHER SYMPTOMS: Do you have any other symptoms? 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 Patient's current available days for this week is Thursday and Friday. Patient is available next week Monday, Tuesday and Thursday. Patient would prefer to be seen in the office. Patient is needing a phone call to be scheduled by office.  Protocols used: Urinary Symptoms-A-AH

## 2024-01-26 ENCOUNTER — Encounter: Payer: Self-pay | Admitting: Pharmacist

## 2024-01-26 ENCOUNTER — Other Ambulatory Visit (HOSPITAL_COMMUNITY): Payer: Self-pay

## 2024-01-26 ENCOUNTER — Encounter: Payer: Self-pay | Admitting: Family Medicine

## 2024-01-26 ENCOUNTER — Ambulatory Visit (INDEPENDENT_AMBULATORY_CARE_PROVIDER_SITE_OTHER): Admitting: Family Medicine

## 2024-01-26 ENCOUNTER — Other Ambulatory Visit: Payer: Self-pay

## 2024-01-26 VITALS — BP 150/84 | HR 58 | Temp 98.0°F | Wt 118.0 lb

## 2024-01-26 DIAGNOSIS — R232 Flushing: Secondary | ICD-10-CM | POA: Diagnosis not present

## 2024-01-26 DIAGNOSIS — R350 Frequency of micturition: Secondary | ICD-10-CM | POA: Diagnosis not present

## 2024-01-26 DIAGNOSIS — Z79890 Hormone replacement therapy (postmenopausal): Secondary | ICD-10-CM

## 2024-01-26 LAB — POC URINALSYSI DIPSTICK (AUTOMATED)
Bilirubin, UA: NEGATIVE
Blood, UA: NEGATIVE
Glucose, UA: NEGATIVE
Ketones, UA: NEGATIVE
Leukocytes, UA: NEGATIVE
Nitrite, UA: NEGATIVE
Protein, UA: NEGATIVE
Spec Grav, UA: 1.01 (ref 1.010–1.025)
Urobilinogen, UA: 0.2 U/dL
pH, UA: 6.5 (ref 5.0–8.0)

## 2024-01-26 MED ORDER — PROGESTERONE MICRONIZED 100 MG PO CAPS
100.0000 mg | ORAL_CAPSULE | Freq: Every day | ORAL | 1 refills | Status: DC
  Filled 2024-02-01: qty 60, 30d supply, fill #0
  Filled 2024-03-27: qty 60, 30d supply, fill #1

## 2024-01-26 MED ORDER — NITROFURANTOIN MONOHYD MACRO 100 MG PO CAPS
100.0000 mg | ORAL_CAPSULE | Freq: Two times a day (BID) | ORAL | 0 refills | Status: DC
  Filled 2024-01-26: qty 10, 5d supply, fill #0

## 2024-01-26 NOTE — Progress Notes (Unsigned)
 [name] , Feb 22, 1956, 68 y.o., female MRN: [id number] Patient Care Team    Relationship Specialty Notifications Start End  Catherine Charlies LABOR, DO PCP - General Family Medicine  12/29/18   Ishmael Slough, MD Consulting Physician Rheumatology  04/09/17   Rosalynn LELON Ingle, MD (Inactive) Consulting Physician Obstetrics and Gynecology  04/09/17   Tricia Tawni CROME, MD Referring Physician Dermatology  04/09/17   Group, [facility]  12/29/18    Comment: Dr. mavis [facility]    12/29/18   Avram Lupita BRAVO, MD Consulting Physician Gastroenterology  12/29/18   Louis Shove, MD Consulting Physician Neurosurgery  12/29/18     Chief Complaint  Patient presents with   Urinary Frequency    2 weeks; urinary frequency, abdominal pressure. Pt also mentions nausea. Pt has not taken anything to relieve sx.      Subjective: [name] is a 68 y.o. Pt presents for an OV with complaints of urinary issues of 2 weeks duration.  Associated symptoms include abd pressure, nausea, frequency..  Pt has tried nothing to ease their symptoms.      04/03/2023    9:21 AM 09/08/2022    1:13 PM 05/29/2022   10:44 AM 01/01/2022    2:16 PM 05/27/2021   10:37 AM  Depression screen PHQ 2/9  Decreased Interest 0 0 0 0 0  Down, Depressed, Hopeless 2 1 0 0 0  PHQ - 2 Score 2 1 0 0 0  Altered sleeping 3 3 3 1    Tired, decreased energy  1 0 0   Change in appetite 0 1 0 0   Feeling bad or failure about yourself  1 0 0 0   Trouble concentrating 0 0 0 0   Moving slowly or fidgety/restless 2 0 0 0   Suicidal thoughts 0 0 0 0   PHQ-9 Score 8 6 3 1    Difficult doing work/chores Not difficult at all Not difficult at all       Allergies  Allergen Reactions   Beef (Bovine) Protein Other (See Comments)   Aspartame And Phenylalanine Other (See Comments)    Migraines   Beef-Derived Drug Products Other (See Comments)    severe cramping   Citrus Other (See  Comments)    Causes Interstitial cystitis flares   Penicillins Hives and Rash    Denies airway involvement Has patient had a PCN reaction causing immediate rash, facial/tongue/throat swelling, SOB or lightheadedness with hypotension: yes hives.  Has patient had a PCN reaction causing severe rash involving mucus membranes or skin necrosis:no Has patient had a PCN reaction that required hospitalization No Has patient had a PCN reaction occurring within the last 10 years: No If all of the above answers are NO, then may proceed with Cephalosporin use.    Promethazine Hcl Other (See Comments)    Muscle twitching and contracture    Beta Vulgaris Other (See Comments)    beets   Bioflavonoids Other (See Comments)   Cat Dander Other (See Comments)   Adhesive [Tape] Other (See Comments)    Red splotches   Morphine Sulfate Nausea And Vomiting   Social History   Social History Narrative   Spiritual Beliefs: methodist   Marital status/children/pets: In second marriage.  First husband committed suicide.  2 children.   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 RA, ? Lupus - reports saw rheumatologist in the past but better now, sees Dr. Malcolm   Eczema    Flushing 05/27/2021   GERD 02/13/2007   Qualifier: Diagnosis of  By: Norleen MD, Lynwood ORN    GERD (gastroesophageal reflux disease)    takes Omeprazole  daily, with nausea   Greater trochanteric bursitis of left hip 03/25/2019   Injected March 25, 2019   Heart murmur    Hemorrhoids    History of  kidney stones    passed on her own   History of migraine    last one a yr ago and takes Zomig  prn   Hot flashes 11/10/2014   HTN (hypertension) 03/31/2012   Hx of echocardiogram    Echo (8/15):  EF 55-60%, no RWMA   IBS (irritable bowel syndrome)    constipation predominant   Interstitial cystitis    hx of UTI   Irritable bowel syndrome 12/14/2009   Qualifier: Diagnosis of  By: Avram MD, NOLIA Lupita BRAVO    Memory loss 12/22/2016   Numbness    both legs and related to back   Osteoporosis    osteopenia   Palpitations    on metoprolol  in past but made BP too low   Pneumonia    hx of;last time 77yrs ago   PONV (postoperative nausea and vomiting)    laryngospasm-1999   Poor vision 10/15/2015   -? Ocular rosacea -sees opthomology    Raynaud's disease 10/21/2012   Raynaud's disease /phenomenon 10/21/2012   no meds, worse in the winter or cold environment.    Rheumatoid aortitis    uses Diclofenac gel;Rheumatoid   Seasonal allergies    Flonase  daily    Strain of left piriformis muscle 05/18/2019   Urinary incontinence    Past Surgical History:  Procedure Laterality Date   ABDOMINAL HYSTERECTOMY  1997   BREAST BIOPSY  1999   benign   COLONOSCOPY  02/11/2013 and 12/24/04   internal and external hemorrhoids   ENDOMETRIAL ABLATION     EXCISION MORTON'S NEUROMA Right    FLEXIBLE SIGMOIDOSCOPY  03/07/2008   internal and external hemorrhoids   POSTERIOR LUMBAR FUSION  05/02/2013   L4-L5 fusion (cage/screws/bone graft) Dr. Louis   TONSILLECTOMY     UPPER GASTROINTESTINAL ENDOSCOPY  10/23/2010   gastritis, irregular Z-line   wisdom teeth extracted     Family History  Problem Relation Age of Onset   Diverticulosis Mother    Hypertension Mother    Osteoarthritis Mother    Depression Mother    Hyperlipidemia Mother    Kidney disease Mother    Coronary artery disease Father    Heart attack Father    Hypertension Father    Depression Father    Hyperlipidemia Father    Alcohol  abuse Father    Early death Father    Kidney disease Father    Mental illness Father    Alcohol abuse Sister  Depression Sister    Colon polyps Sister    Kidney disease Sister    Alcohol abuse Daughter    Depression Daughter    Drug abuse Daughter    Alcohol abuse Son    Depression Son    Diabetes Paternal Uncle    Hypertension Maternal Grandmother    Stroke Maternal Grandmother    Hypertension Maternal Grandfather    Pancreatic cancer Maternal Grandfather    Early death Maternal Grandfather    Depression Paternal Grandmother    Dementia Paternal Grandmother    Arthritis Paternal Grandmother    Mental illness Paternal Grandmother    Stroke Paternal Grandfather    Hyperlipidemia Paternal Grandfather    Alcohol abuse Paternal Grandfather    Early death Paternal Grandfather    Hypertension Paternal Grandfather    Colon cancer Neg Hx    Allergies as of 01/26/2024       Reactions   Beef (bovine) Protein Other (See Comments)   Aspartame And Phenylalanine Other (See Comments)   Migraines   Beef-derived Drug Products Other (See Comments)   severe cramping   Citrus Other (See Comments)   Causes Interstitial cystitis flares   Penicillins Hives, Rash   Denies airway involvement Has patient had a PCN reaction causing immediate rash, facial/tongue/throat swelling, SOB or lightheadedness with hypotension: yes hives.  Has patient had a PCN reaction causing severe rash involving mucus membranes or skin necrosis:no Has patient had a PCN reaction that required hospitalization No Has patient had a PCN reaction occurring within the last 10 years: No If all of the above answers are NO, then may proceed with Cephalosporin use.   Promethazine Hcl Other (See Comments)   Muscle twitching and contracture    Beta Vulgaris Other (See Comments)   beets   Bioflavonoids Other (See Comments)   Cat Dander Other (See Comments)   Adhesive [tape] Other (See Comments)   Red splotches   Morphine  Sulfate Nausea And Vomiting        Medication List        Accurate as of January 26, 2024 11:29 AM. If you have any questions, ask your nurse or doctor.          albuterol  108 (90 Base) MCG/ACT inhaler Commonly known as: VENTOLIN  HFA Inhale 2 puffs 15 minutes before exercise and as needed for wheeze.   ALPRAZolam  0.5 MG tablet Commonly known as: Xanax  Take 1 tablet (0.5 mg total) by mouth 3 (three) times daily as needed for anxiety.   amLODipine  2.5 MG tablet Commonly known as: NORVASC  Take 1 tablet (2.5 mg total) by mouth daily.   atenolol  25 MG tablet Commonly known as: TENORMIN  Take 1 tablet (25 mg total) by mouth daily.   cyanocobalamin  1000 MCG/ML injection Commonly known as: VITAMIN B12 Inject 1 mL (1,000 mcg total) into the muscle every 14 (fourteen) days.   esomeprazole  40 MG capsule Commonly known as: NEXIUM  Take by mouth.   ketoconazole  2 % shampoo Commonly known as: NIZORAL  Use topically as directed for 30 days   lamoTRIgine  200 MG tablet Commonly known as: LAMICTAL  Take 1 tablet (200 mg total) by mouth at bedtime.   lamoTRIgine  25 MG tablet Commonly known as: LaMICtal  Take 2 tablets (50 mg total) by mouth daily.   Luer Lock Safety Syringes 25G X 1 3 ML Misc Generic drug: SYRINGE-NEEDLE (DISP) 3 ML Use as directed to inject b-12 into the skin   meloxicam  15 MG tablet Commonly known as: MOBIC  Take 1  tablet (15 mg total) by mouth daily for joint pain.   omeprazole  40 MG capsule Commonly known as: PRILOSEC Take 1 capsule (40 mg total) by mouth daily.   Proctozone -HC 2.5 % rectal cream Generic drug: hydrocortisone  Insert rectally 2 times daily for 10 days   progesterone  100 MG capsule Commonly known as: PROMETRIUM  Take 1-2 capsules (100-200 mg total) by mouth at bedtime.   QUEtiapine  50 MG tablet Commonly known as: SEROquel  Take 1 tablet (50 mg total) by mouth at bedtime.   Tavaborole  5 % Soln Commonly known as: Kerydin  Apply 1 drop  topically daily. Apply 1 drop to the toenail daily.        All past medical history, surgical history, allergies, family history, immunizations andmedications were updated in the EMR today and reviewed under the history and medication portions of their EMR.     Review of Systems  Constitutional:  Negative for chills and fever.  Gastrointestinal:  Positive for nausea.  Genitourinary:  Positive for frequency. Negative for dysuria, flank pain, hematuria and urgency.  Skin:  Negative for rash.  Neurological:  Negative for dizziness.   Negative, with the exception of above mentioned in HPI   Objective:  BP (!) 150/84   Pulse (!) 58   Temp 98 F (36.7 C)   Wt 118 lb (53.5 kg)   SpO2 99%   BMI 21.58 kg/m  Body mass index is 21.58 kg/m. Physical Exam ***  No results found. No results found. Results for orders placed or performed in visit on 01/26/24 (from the past 24 hours)  POCT Urinalysis Dipstick (Automated)     Status: None   Collection Time: 01/26/24 11:28 AM  Result Value Ref Range   Color, UA yellow    Clarity, UA clear    Glucose, UA Negative Negative   Bilirubin, UA Negative    Ketones, UA Negative    Spec Grav, UA 1.010 1.010 - 1.025   Blood, UA Negative    pH, UA 6.5 5.0 - 8.0   Protein, UA Negative Negative   Urobilinogen, UA 0.2 0.2 or 1.0 E.U./dL   Nitrite, UA Negative    Leukocytes, UA Negative Negative    Assessment/Plan: ANBER MCKIVER is a 68 y.o. female present for OV for  *** Reviewed expectations re: course of current medical issues. Discussed self-management of symptoms. Outlined signs and symptoms indicating need for more acute intervention. Patient verbalized understanding and all questions were answered. Patient received an After-Visit Summary.    Orders Placed This Encounter  Procedures   Urinalysis w microscopic + reflex cultur   POCT Urinalysis Dipstick (Automated)   No orders of the defined types were placed in this  encounter.  Referral Orders  No referral(s) requested today     Note is dictated utilizing voice recognition software. Although note has been proof read prior to signing, occasional typographical errors still can be missed. If any questions arise, please do not hesitate to call for verification.   electronically signed by:  Charlies Bellini, DO  [facility] - [facility]

## 2024-01-27 ENCOUNTER — Ambulatory Visit: Payer: Self-pay | Admitting: Family Medicine

## 2024-01-27 LAB — URINALYSIS W MICROSCOPIC + REFLEX CULTURE
Bacteria, UA: NONE SEEN /HPF
Bilirubin Urine: NEGATIVE
Glucose, UA: NEGATIVE
Hgb urine dipstick: NEGATIVE
Hyaline Cast: NONE SEEN /LPF
Ketones, ur: NEGATIVE
Leukocyte Esterase: NEGATIVE
Nitrites, Initial: NEGATIVE
Protein, ur: NEGATIVE
RBC / HPF: NONE SEEN /HPF (ref 0–2)
Specific Gravity, Urine: 1.003 (ref 1.001–1.035)
Squamous Epithelial / HPF: NONE SEEN /HPF (ref <=5)
WBC, UA: NONE SEEN /HPF (ref 0–5)
pH: 7 (ref 5.0–8.0)

## 2024-01-27 LAB — NO CULTURE INDICATED

## 2024-01-27 NOTE — Telephone Encounter (Signed)
 Called pt and results and recommendations given.

## 2024-02-01 ENCOUNTER — Other Ambulatory Visit: Payer: Self-pay

## 2024-02-01 ENCOUNTER — Other Ambulatory Visit (HOSPITAL_COMMUNITY): Payer: Self-pay

## 2024-02-01 NOTE — Progress Notes (Signed)
 Ben Nashly Olsson D.[facility] [address] [place] [address] Phone: [phone]   Assessment and Plan:     1.  Bilateral hand pain 2. Arthritis of carpometacarpal (CMC) joint of both thumbs -Chronic with exacerbation, subsequent visit - Recurrent left thumb pain consistent with flare of CMC osteoarthritis , and worsening right thumb pain consistent with flare of CMC osteoarthritis - X-ray obtained in clinic.  My interpretation: No acute fracture or dislocation.  Severe degenerative changes at bilateral CMC joints.  Moderate degenerative changes at carpometacarpal joints. - Recommend warm hand baths, topical Voltaren gel, bracing as needed for pain relief - Use Tylenol  500 to 1000 mg tablets 2-3 times a day for day-to-day pain relief  -Patient elected for CSI to left CMC joint.  Patient has had CSI to right [facility] joint in the past with no significant relief, so elected to not repeat right [facility] CSI today.  Procedure: Ultrasound Guided Carpometacarpal Joint Injection Side: left Diagnosis: CMC osteoarthritis US  Indication:  - accuracy is paramount for diagnosis - to ensure therapeutic efficacy or procedural success - to reduce procedural risk  After explaining the procedure, viable alternatives, risks, and answering any questions, consent was given verbally. The site was cleaned with chlorhexidine prep.  An ultrasound transducer was placed on the [facility] joint.  A steroid injection was performed under ultrasound guidance using 0.47ml of 1% lidocaine  without epinephrine and 20 mg of Kenalog  40. This was well tolerated.  Needle was removed and dressing placed and post injection instructions were given including  a discussion of likely return of pain today after the anesthetic wears off (with the possibility of worsened pain) until the steroid starts to work in 1-3 days.     Pt was advised to call or return to clinic if these symptoms worsen or fail to improve as  anticipated. Images permanently stored.    Pertinent previous records reviewed include none  Follow Up: As needed if no improvement or worsening of symptoms.  Could consider repeat CSI versus PRP injection   Subjective:   I, Moenique Parris, am serving as a Neurosurgeon for Doctor Morene Mace  Chief Complaint: bilat thumb pain   HPI:   02/02/2024 Patient is a 68 year old female with bialt thumb pain. Patient states right thumb 2 years of pain. Pain is increasing and now she has numbness.TTP   Left thumb pain started 8 months ago. No numbness or tingling.   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 Would like to discuss CSI   Relevant Historical Information: Osteoarthritis of multiple joints, IBS, GERD, Raynaud's  Additional pertinent review of systems negative.   Current Outpatient Medications:    albuterol  (VENTOLIN  HFA) 108 (90 Base) MCG/ACT inhaler, Inhale 2 puffs 15 minutes before exercise and as needed for wheeze., Disp: 6.7 g, Rfl: 5   ALPRAZolam  (XANAX ) 0.5 MG tablet, Take 1 tablet (0.5 mg total) by mouth 3 (three) times daily as needed for anxiety., Disp: 90 tablet, Rfl: 0   amLODipine  (NORVASC ) 2.5 MG tablet, Take 1 tablet (2.5 mg total) by mouth daily., Disp: 90 tablet, Rfl: 3   atenolol  (TENORMIN ) 25 MG tablet, Take 1 tablet (25 mg total) by mouth daily., Disp: 90 tablet, Rfl: 3   cyanocobalamin  (VITAMIN B12) 1000 MCG/ML injection, Inject 1 mL (1,000 mcg total) into the muscle every 14 (fourteen) days., Disp: 6 mL, Rfl: 3   esomeprazole  (NEXIUM ) 40 MG capsule, Take by mouth., Disp: , Rfl:  hydrocortisone  (ANUSOL -HC) 2.5 % rectal cream, Insert rectally 2 times daily for 10 days, Disp: 30 g, Rfl: 1   ketoconazole  (NIZORAL ) 2 % shampoo, Use topically as directed for 30 days, Disp: 120 mL, Rfl: 5   lamoTRIgine  (LAMICTAL ) 200 MG tablet, Take 1 tablet (200 mg total) by mouth at bedtime., Disp: 90 tablet, Rfl: 3   lamoTRIgine  (LAMICTAL ) 25 MG tablet, Take 2 tablets (50 mg  total) by mouth daily., Disp: 60 tablet, Rfl: 2   meloxicam  (MOBIC ) 15 MG tablet, Take 1 tablet (15 mg total) by mouth daily for joint pain., Disp: 30 tablet, Rfl: 3   nitrofurantoin , macrocrystal-monohydrate, (MACROBID ) 100 MG capsule, Take 1 capsule (100 mg total) by mouth 2 (two) times daily., Disp: 10 capsule, Rfl: 0   omeprazole  (PRILOSEC) 40 MG capsule, Take 1 capsule (40 mg total) by mouth daily., Disp: 90 capsule, Rfl: 3   progesterone  (PROMETRIUM ) 100 MG capsule, Take 1-2 capsules (100-200 mg total) by mouth at bedtime., Disp: 60 capsule, Rfl: 1   QUEtiapine  (SEROQUEL ) 50 MG tablet, Take 1 tablet (50 mg total) by mouth at bedtime., Disp: 30 tablet, Rfl: 0   SYRINGE-NEEDLE, DISP, 3 ML (B-D 3CC LUER-LOK SYR 25GX1) 25G X 1 3 ML MISC, Use as directed to inject b-12 into the skin, Disp: 6 each, Rfl: 3   Tavaborole  (KERYDIN ) 5 % SOLN, Apply 1 drop topically daily. Apply 1 drop to the toenail daily., Disp: 10 mL, Rfl: 2   Objective:     Vitals:   02/02/24 0832  BP: 118/76  Pulse: 65  SpO2: 97%  Weight: 117 lb (53.1 kg)  Height: 5' 2 (1.575 m)      Body mass index is 21.4 kg/m.    Physical Exam:    General: Appears well, nad, nontoxic and pleasant Neuro:sensation intact, strength is 5/5 in upper extremities, muscle tone wnl Skin:no susupicious lesions or rashes  Bilateral hand/Wrist:  No deformity or swelling appreciated. ROM  Ext 90, flexion 70, radial/ulnar deviation 30 TTP bilateral CMC nttp over the snuff box, dorsal carpals, volar carpals, radial styloid, ulnar styloid, 1st mcp, tfcc  No pain with resisted ext, flex or deviation    Electronically signed by:  Odis Mace D.[facility] 8:51 AM 02/02/24

## 2024-02-02 ENCOUNTER — Ambulatory Visit: Payer: Self-pay

## 2024-02-02 ENCOUNTER — Ambulatory Visit (INDEPENDENT_AMBULATORY_CARE_PROVIDER_SITE_OTHER)

## 2024-02-02 ENCOUNTER — Ambulatory Visit (INDEPENDENT_AMBULATORY_CARE_PROVIDER_SITE_OTHER): Admitting: Sports Medicine

## 2024-02-02 VITALS — BP 118/76 | HR 65 | Ht 62.0 in | Wt 117.0 lb

## 2024-02-02 DIAGNOSIS — M79642 Pain in left hand: Secondary | ICD-10-CM

## 2024-02-02 DIAGNOSIS — M180 Bilateral primary osteoarthritis of first carpometacarpal joints: Secondary | ICD-10-CM | POA: Diagnosis not present

## 2024-02-02 DIAGNOSIS — M79641 Pain in right hand: Secondary | ICD-10-CM | POA: Diagnosis not present

## 2024-02-02 DIAGNOSIS — M1812 Unilateral primary osteoarthritis of first carpometacarpal joint, left hand: Secondary | ICD-10-CM

## 2024-02-02 NOTE — Patient Instructions (Signed)
 As needed follow up, but can call and ask for repeat steroid injection or PRP

## 2024-02-08 ENCOUNTER — Ambulatory Visit: Payer: Self-pay | Admitting: Sports Medicine

## 2024-02-18 ENCOUNTER — Other Ambulatory Visit: Payer: Self-pay | Admitting: Adult Health

## 2024-02-18 DIAGNOSIS — G4700 Insomnia, unspecified: Secondary | ICD-10-CM

## 2024-02-19 NOTE — Telephone Encounter (Signed)
 Past due for FU. Sent Mychart message.

## 2024-02-23 ENCOUNTER — Other Ambulatory Visit: Payer: Self-pay

## 2024-02-23 ENCOUNTER — Other Ambulatory Visit: Payer: Self-pay | Admitting: Adult Health

## 2024-02-23 ENCOUNTER — Other Ambulatory Visit (HOSPITAL_COMMUNITY): Payer: Self-pay

## 2024-02-23 DIAGNOSIS — G4700 Insomnia, unspecified: Secondary | ICD-10-CM

## 2024-02-23 MED ORDER — QUETIAPINE FUMARATE 50 MG PO TABS
50.0000 mg | ORAL_TABLET | Freq: Every day | ORAL | 0 refills | Status: DC
  Filled 2024-02-23: qty 30, 30d supply, fill #0

## 2024-02-23 NOTE — Telephone Encounter (Signed)
 Pt made an appt for 08/27

## 2024-03-02 ENCOUNTER — Telehealth: Payer: Self-pay | Admitting: Sports Medicine

## 2024-03-02 ENCOUNTER — Telehealth: Payer: Self-pay | Admitting: Family Medicine

## 2024-03-02 NOTE — Telephone Encounter (Signed)
 Patient called and said that she received a steroid from Dr. Leonce but thinks that she needs plasma in her same thumb. Patient says it hurts worse now and is swollen. She does not know if it is too early to get that done. Patient would like us  to call on her work phone. Please advise.

## 2024-03-02 NOTE — Telephone Encounter (Signed)
 error

## 2024-03-03 NOTE — Progress Notes (Signed)
 [name] [address] [place] [address] Phone: [phone] Subjective:   [name], am serving as a scribe for Dr. Arthea Claudene. I'm seeing this patient by the request  of:  Kuneff, Renee A, DO  CC: Thumb pain  YEP:Dlagzrupcz  [name] is a 68 y.o. female coming in with complaint of L thumb pain. Here for PRP.  Had an PRP in the right thumb in 2021.  Patient states that she had steroid injection that made her pain worse.      Current Outpatient Medications (Endocrine & Metabolic):    progesterone  (PROMETRIUM ) 100 MG capsule, Take 1-2 capsules (100-200 mg total) by mouth at bedtime.  Current Outpatient Medications (Cardiovascular):    amLODipine  (NORVASC ) 2.5 MG tablet, Take 1 tablet (2.5 mg total) by mouth daily.   atenolol  (TENORMIN ) 25 MG tablet, Take 1 tablet (25 mg total) by mouth daily.  Current Outpatient Medications (Respiratory):    albuterol  (VENTOLIN  HFA) 108 (90 Base) MCG/ACT inhaler, Inhale 2 puffs 15 minutes before exercise and as needed for wheeze.  Current Outpatient Medications (Analgesics):    meloxicam  (MOBIC ) 15 MG tablet, Take 1 tablet (15 mg total) by mouth daily for joint pain.  Current Outpatient Medications (Hematological):    cyanocobalamin  (VITAMIN B12) 1000 MCG/ML injection, Inject 1 mL (1,000 mcg total) into the muscle every 14 (fourteen) days.  Current Outpatient Medications (Other):    ALPRAZolam  (XANAX ) 0.5 MG tablet, Take 1 tablet (0.5 mg total) by mouth 3 (three) times daily as needed for anxiety.   esomeprazole  (NEXIUM ) 40 MG capsule, Take by mouth.   hydrocortisone  (ANUSOL -HC) 2.5 % rectal cream, Insert rectally 2 times daily for 10 days   ketoconazole  (NIZORAL ) 2 % shampoo, Use topically as directed for 30 days   lamoTRIgine  (LAMICTAL ) 200 MG tablet, Take 1 tablet (200 mg total) by mouth at bedtime.   lamoTRIgine  (LAMICTAL ) 25 MG tablet, Take 2 tablets (50 mg total) by mouth daily.    nitrofurantoin , macrocrystal-monohydrate, (MACROBID ) 100 MG capsule, Take 1 capsule (100 mg total) by mouth 2 (two) times daily.   omeprazole  (PRILOSEC) 40 MG capsule, Take 1 capsule (40 mg total) by mouth daily.   QUEtiapine  (SEROQUEL ) 50 MG tablet, Take 1 tablet (50 mg total) by mouth at bedtime.   SYRINGE-NEEDLE, DISP, 3 ML (B-D 3CC LUER-LOK SYR 25GX1) 25G X 1 3 ML MISC, Use as directed to inject b-12 into the skin   Tavaborole  (KERYDIN ) 5 % SOLN, Apply 1 drop topically daily. Apply 1 drop to the toenail daily.   Reviewed prior external information including notes and imaging from  primary care provider As well as notes that were available from care everywhere and other healthcare systems.  Past medical history, social, surgical and family history all reviewed in electronic medical record.  No pertanent information unless stated regarding to the chief complaint.   Review of Systems:  No headache, visual changes, nausea, vomiting, diarrhea, constipation, dizziness, abdominal pain, skin rash, fevers, chills, night sweats, weight loss, swollen lymph nodes, body aches, joint swelling, chest pain, shortness of breath, mood changes. POSITIVE muscle aches  Objective  Blood pressure 124/84, pulse 85, height 5' 2 (1.575 m), weight 116 lb (52.6 kg), SpO2 96%.   General: No apparent distress alert and oriented x3 mood and affect normal, dressed appropriately.   Procedure: Real-time Ultrasound Guided Injection of left CMC joint Device: GE Logiq Q7 Ultrasound guided injection is preferred based studies that show increased duration,  increased effect, greater accuracy, decreased procedural pain, increased response rate, and decreased cost with ultrasound guided versus blind injection.  Verbal informed consent obtained.  Time-out conducted.  Noted no overlying erythema, induration, or other signs of local infection.  Skin prepped in a sterile fashion.  Local anesthesia: Topical Ethyl chloride.   With sterile technique and under real time ultrasound guidance: With a 21-gauge 2 inch needle injected into the [facility] as well as then the distal radiocarpal joint with a total of 0.5 cc of 0.5% Marcaine .  Was then injected a total of 2 cc of PRP Completed without difficulty  Pain immediately resolved suggesting accurate placement of the medication.  Advised to call if fevers/chills, erythema, induration, drainage, or persistent bleeding.  Impression: Technically successful ultrasound guided injection.      Impression and Recommendations:    The above documentation has been reviewed and is accurate and complete Carol Hurtado [name], DO

## 2024-03-07 ENCOUNTER — Encounter: Payer: Self-pay | Admitting: Family Medicine

## 2024-03-07 ENCOUNTER — Ambulatory Visit: Payer: Self-pay | Admitting: Family Medicine

## 2024-03-07 ENCOUNTER — Other Ambulatory Visit: Payer: Self-pay

## 2024-03-07 VITALS — BP 124/84 | HR 85 | Ht 62.0 in | Wt 116.0 lb

## 2024-03-07 DIAGNOSIS — M79645 Pain in left finger(s): Secondary | ICD-10-CM

## 2024-03-07 DIAGNOSIS — G8929 Other chronic pain: Secondary | ICD-10-CM

## 2024-03-07 DIAGNOSIS — M1812 Unilateral primary osteoarthritis of first carpometacarpal joint, left hand: Secondary | ICD-10-CM | POA: Insufficient documentation

## 2024-03-07 NOTE — Patient Instructions (Signed)
No ice or IBU for 3 days Heat and Tylenol are ok See me again in 6-8 weeks 

## 2024-03-07 NOTE — Assessment & Plan Note (Signed)
 Patient responded extremely well to this.  Discussed icing regimen and home exercises.  Discussed with patient not to do the icing within the first 72 hours.  Post PRP handout given.

## 2024-03-09 ENCOUNTER — Other Ambulatory Visit: Payer: Self-pay

## 2024-03-09 ENCOUNTER — Other Ambulatory Visit (HOSPITAL_COMMUNITY): Payer: Self-pay

## 2024-03-09 ENCOUNTER — Ambulatory Visit: Admitting: Adult Health

## 2024-03-09 ENCOUNTER — Encounter: Payer: Self-pay | Admitting: Adult Health

## 2024-03-09 DIAGNOSIS — F331 Major depressive disorder, recurrent, moderate: Secondary | ICD-10-CM | POA: Diagnosis not present

## 2024-03-09 DIAGNOSIS — F410 Panic disorder [episodic paroxysmal anxiety] without agoraphobia: Secondary | ICD-10-CM

## 2024-03-09 DIAGNOSIS — F4310 Post-traumatic stress disorder, unspecified: Secondary | ICD-10-CM | POA: Diagnosis not present

## 2024-03-09 DIAGNOSIS — F411 Generalized anxiety disorder: Secondary | ICD-10-CM | POA: Diagnosis not present

## 2024-03-09 DIAGNOSIS — G4700 Insomnia, unspecified: Secondary | ICD-10-CM | POA: Diagnosis not present

## 2024-03-09 MED ORDER — LAMOTRIGINE 200 MG PO TABS
200.0000 mg | ORAL_TABLET | Freq: Every day | ORAL | 1 refills | Status: AC
  Filled 2024-03-09: qty 90, 90d supply, fill #0
  Filled 2024-06-17: qty 90, 90d supply, fill #1

## 2024-03-09 MED ORDER — QUETIAPINE FUMARATE 50 MG PO TABS
50.0000 mg | ORAL_TABLET | Freq: Every day | ORAL | 1 refills | Status: AC
  Filled 2024-03-09 – 2024-03-18 (×2): qty 90, 90d supply, fill #0
  Filled 2024-06-17: qty 90, 90d supply, fill #1

## 2024-03-09 NOTE — Progress Notes (Signed)
 [name] [id number] 1955-12-11 67 y.o.  Virtual Visit via Telephone Note  I connected with pt on 03/09/24 at 12:00 PM EDT by telephone and verified that I am speaking with the correct person using two identifiers.   I discussed the limitations, risks, security and privacy concerns of performing an evaluation and management service by telephone and the availability of in person appointments. I also discussed with the patient that there may be a patient responsible charge related to this service. The patient expressed understanding and agreed to proceed.   I discussed the assessment and treatment plan with the patient. The patient was provided an opportunity to ask questions and all were answered. The patient agreed with the plan and demonstrated an understanding of the instructions.   The patient was advised to call back or seek an in-person evaluation if the symptoms worsen or if the condition fails to improve as anticipated.  I provided 25 minutes of non-face-to-face time during this encounter.  The patient was located at home.  The provider was located at [facility].   Angeline LOISE Sayers, NP   Subjective:   Patient ID:  [name] is a 68 y.o. (DOB Oct 05, 1955) female.  Chief Complaint: No chief complaint on file.   HPI [name] presents for follow-up of MDD, GAD, panic disorder, insomnia and PTSD.  Describes mood today as ok. Pleasant. Reports tearfulness. Mood symptoms - reports some anxiety, depression and irritability - situational. Reports stable interest and motivation. Denies panic attacks. Reports worry, rumination and over thinking. Mood is variable - depends on the day. Stating I feel like I'm doing ok. Feels like medications are helpful. Seeing therapist - Marval Bunde. Taking medications as prescribed.  Energy levels stable. Active, has a regular exercise routine - biking 60 to 80 miles a week. Enjoys some usual interests and  activities. Married. Lives with husband of 16 years and 5 dogs. Mother lives in [place]  - assisted living. Spending time with family. Appetite adequate. Weight stable - 116 pounds. Sleeps has improved. Averages 6 hours. Focus and concentration stable. Completing tasks. Managing aspects of household. 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  Denies SI or HI.  Denies AH or VH. Denies self harm. Denies substance use.  Previous medication trials: Remeron , Trazadone, Restoril , Xanax , Seroquel , Belsomra    Review of Systems:  Review of Systems  Musculoskeletal:  Negative for gait problem.  Neurological:  Negative for tremors.  Psychiatric/Behavioral:         Please refer to HPI    Medications: I have reviewed the patient's current medications.  Current Outpatient Medications  Medication Sig Dispense Refill   albuterol  (VENTOLIN  HFA) 108 (90 Base) MCG/ACT inhaler Inhale 2 puffs 15 minutes before exercise and as needed for wheeze. 6.7 g 5   ALPRAZolam  (XANAX ) 0.5 MG tablet Take 1 tablet (0.5 mg total) by mouth 3 (three) times daily as needed for anxiety. 90 tablet 0   amLODipine  (NORVASC ) 2.5 MG tablet Take 1 tablet (2.5 mg total) by mouth daily. 90 tablet 3   atenolol  (TENORMIN ) 25 MG tablet Take 1 tablet (25 mg total) by mouth daily. 90 tablet 3   cyanocobalamin  (VITAMIN B12) 1000 MCG/ML injection Inject 1 mL (1,000 mcg total) into the muscle every 14 (fourteen) days. 6 mL 3   esomeprazole  (NEXIUM ) 40 MG capsule Take by mouth.     hydrocortisone  (ANUSOL -HC) 2.5 % rectal cream Insert rectally 2 times daily for 10 days 30  g 1   ketoconazole  (NIZORAL ) 2 % shampoo Use topically as directed for 30 days 120 mL 5   lamoTRIgine  (LAMICTAL ) 200 MG tablet Take 1 tablet (200 mg total) by mouth at bedtime. 90 tablet 3   lamoTRIgine  (LAMICTAL ) 25 MG tablet Take 2 tablets (50 mg total) by mouth daily. 60 tablet 2   meloxicam  (MOBIC ) 15 MG tablet Take 1 tablet (15 mg total) by mouth daily for  joint pain. 30 tablet 3   nitrofurantoin , macrocrystal-monohydrate, (MACROBID ) 100 MG capsule Take 1 capsule (100 mg total) by mouth 2 (two) times daily. 10 capsule 0   omeprazole  (PRILOSEC) 40 MG capsule Take 1 capsule (40 mg total) by mouth daily. 90 capsule 3   progesterone  (PROMETRIUM ) 100 MG capsule Take 1-2 capsules (100-200 mg total) by mouth at bedtime. 60 capsule 1   QUEtiapine  (SEROQUEL ) 50 MG tablet Take 1 tablet (50 mg total) by mouth at bedtime. 30 tablet 0   SYRINGE-NEEDLE, DISP, 3 ML (B-D 3CC LUER-LOK SYR 25GX1) 25G X 1 3 ML MISC Use as directed to inject b-12 into the skin 6 each 3   Tavaborole  (KERYDIN ) 5 % SOLN Apply 1 drop topically daily. Apply 1 drop to the toenail daily. 10 mL 2   No current facility-administered medications for this visit.    Medication Side Effects: None  Allergies:  Allergies  Allergen Reactions   Beef (Bovine) Protein Other (See Comments)   Aspartame And Phenylalanine Other (See Comments)    Migraines   Beef-Derived Drug Products Other (See Comments)    severe cramping   Citrus Other (See Comments)    Causes Interstitial cystitis flares   Penicillins Hives and Rash    Denies airway involvement Has patient had a PCN reaction causing immediate rash, facial/tongue/throat swelling, SOB or lightheadedness with hypotension: yes hives.  Has patient had a PCN reaction causing severe rash involving mucus membranes or skin necrosis:no Has patient had a PCN reaction that required hospitalization No Has patient had a PCN reaction occurring within the last 10 years: No If all of the above answers are NO, then may proceed with Cephalosporin use.    Promethazine Hcl Other (See Comments)    Muscle twitching and contracture    Beta Vulgaris Other (See Comments)    beets   Bioflavonoids Other (See Comments)   Cat Dander Other (See Comments)   Adhesive [Tape] Other (See Comments)    Red splotches   Morphine Sulfate Nausea And Vomiting    Past  Medical History:  Diagnosis Date   Allergy    Anemia    after birth of child 103yrs ago   Anxiety and depression    with insomnia   Autoimmune disease (HCC) 08/06/2020   Blood transfusion without reported diagnosis    x3   Chronic back pain    ? RA, ? Lupus - reports saw rheumatologist in the past but better now, sees Dr. Malcolm   Eczema    Flushing 05/27/2021   GERD 02/13/2007   Qualifier: Diagnosis of  By: Norleen MD, Lynwood ORN    GERD (gastroesophageal reflux disease)    takes Omeprazole  daily, with nausea   Greater trochanteric bursitis of left hip 03/25/2019   Injected March 25, 2019   Heart murmur    Hemorrhoids    History of kidney stones    passed on her own   History of migraine    last one a yr ago and takes Zomig  prn   Hot flashes 11/10/2014  HTN (hypertension) 03/31/2012   Hx of echocardiogram    Echo (8/15):  EF 55-60%, no RWMA   IBS (irritable bowel syndrome)    constipation predominant   Interstitial cystitis    hx of UTI   Irritable bowel syndrome 12/14/2009   Qualifier: Diagnosis of  By: Avram MD, NOLIA Lupita BRAVO    Memory loss 12/22/2016   Numbness    both legs and related to back   Osteoporosis    osteopenia   Palpitations    on metoprolol  in past but made BP too low   Pneumonia    hx of;last time 77yrs ago   PONV (postoperative nausea and vomiting)    laryngospasm-1999   Poor vision 10/15/2015   -? Ocular rosacea -sees opthomology    Raynaud's disease 10/21/2012   Raynaud's disease /phenomenon 10/21/2012   no meds, worse in the winter or cold environment.    Rheumatoid aortitis    uses Diclofenac gel;Rheumatoid   Seasonal allergies    Flonase  daily    Strain of left piriformis muscle 05/18/2019   Urinary incontinence     Family History  Problem Relation Age of Onset   Diverticulosis Mother    Hypertension Mother    Osteoarthritis Mother    Depression Mother    Hyperlipidemia Mother    Kidney disease Mother    Coronary artery disease  Father    Heart attack Father    Hypertension Father    Depression Father    Hyperlipidemia Father    Alcohol abuse Father    Early death Father    Kidney disease Father    Mental illness Father    Alcohol abuse Sister    Depression Sister    Colon polyps Sister    Kidney disease Sister    Alcohol abuse Daughter    Depression Daughter    Drug abuse Daughter    Alcohol abuse Son    Depression Son    Diabetes Paternal Uncle    Hypertension Maternal Grandmother    Stroke Maternal Grandmother    Hypertension Maternal Grandfather    Pancreatic cancer Maternal Grandfather    Early death Maternal Grandfather    Depression Paternal Grandmother    Dementia Paternal Grandmother    Arthritis Paternal Grandmother    Mental illness Paternal Grandmother    Stroke Paternal Grandfather    Hyperlipidemia Paternal Grandfather    Alcohol abuse Paternal Grandfather    Early death Paternal Grandfather    Hypertension Paternal Grandfather    Colon cancer Neg Hx     Social History   Socioeconomic History   Marital status: Married    Spouse name: Not on file   Number of children: Not on file   Years of education: Not on file   Highest education level: Not on file  Occupational History   Occupation: Teacher, adult education: [employer]  Tobacco Use   Smoking status: Never   Smokeless tobacco: Never  Vaping Use   Vaping status: Never Used  Substance and Sexual Activity   Alcohol use: Yes    Alcohol/week: 2.0 standard drinks of alcohol    Types: 1 Shots of liquor, 1 Standard drinks or equivalent per week    Comment: one margarita a week   Drug use: No   Sexual activity: Not Currently    Partners: Male  Other Topics Concern   Not on file  Social History Narrative   Spiritual Beliefs: methodist   Marital status/children/pets: In second marriage.  First husband committed suicide.  2 children.   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   Please see review of systems for further details on the patient's review from today.   Objective:   Physical Exam:  There were no vitals taken for this visit.  Physical Exam Constitutional:      General: She is not in acute distress. Musculoskeletal:        General: No deformity.  Neurological:     Mental Status: She is alert and oriented to person, place, and time.     Coordination: Coordination normal.  Psychiatric:        Attention and Perception: Attention and perception normal. She does not perceive auditory or visual hallucinations.        Mood and Affect: Mood normal. Mood is not anxious or depressed. Affect is not labile, blunt, angry or inappropriate.        Speech: Speech normal.        Behavior: Behavior normal.        Thought Content: Thought content normal. Thought content is not paranoid or delusional. Thought content does not include homicidal or suicidal ideation. Thought content does not include homicidal or suicidal plan.        Cognition and Memory: Cognition and memory normal.        Judgment: Judgment normal.     Comments: Insight intact     Lab Review:     Component Value Date/Time   NA 139 06/16/2023 1109   NA 143 12/22/2016 0948   K 5.1 06/16/2023 1109   CL 102 06/16/2023 1109   CO2 32 06/16/2023 1109   GLUCOSE 96 06/16/2023 1109   GLUCOSE 95 04/21/2006 1635   BUN 21 06/16/2023 1109   BUN 17 12/22/2016 0948   CREATININE 1.15  06/16/2023 1109   CALCIUM  10.2 06/16/2023 1109   PROT 6.9 06/16/2023 1109   PROT 7.0 12/22/2016 0948   ALBUMIN  4.6 06/16/2023 1109   ALBUMIN  4.7 12/22/2016 0948   AST 27 06/16/2023 1109   ALT 18 06/16/2023 1109   ALKPHOS 104 06/16/2023 1109   BILITOT 0.6 06/16/2023 1109   BILITOT 0.3 12/22/2016 0948   GFRNONAA 65 12/22/2016 0948   GFRAA 75 12/22/2016 0948       Component Value Date/Time   WBC 4.5 04/03/2023 0950   RBC 4.70 04/03/2023 0950   HGB 14.1 04/03/2023 0950   HGB 14.1 12/22/2016 0948   HCT 43.7 04/03/2023 0950   HCT 44.0 12/22/2016 0948   PLT 204.0 04/03/2023 0950   PLT 206 12/22/2016 0948   MCV 93.0 04/03/2023 0950   MCV 94 12/22/2016 0948   MCH 30.3 12/22/2016 0948   MCH 30.7 11/01/2015 1154   MCHC 32.2 04/03/2023 0950   RDW 13.8 04/03/2023 0950  RDW 14.3 12/22/2016 0948   LYMPHSABS 1.4 04/03/2023 0950   LYMPHSABS 0.9 12/22/2016 0948   MONOABS 0.4 04/03/2023 0950   EOSABS 0.3 04/03/2023 0950   EOSABS 0.1 12/22/2016 0948   BASOSABS 0.1 04/03/2023 0950   BASOSABS 0.0 12/22/2016 0948    No results found for: POCLITH, LITHIUM   No results found for: PHENYTOIN, PHENOBARB, VALPROATE, CBMZ   .res Assessment: Plan:    Plan:  PDMP reviewed  Seroquel  50mg  at bedtime  Xanax  0.5mg  TID - decreased use Lamictal  200mg  daily  RTC 6 momths  25 minutes spent dedicated to the care of this patient on the date of this encounter to include pre-visit review of records, ordering of medication, post visit documentation, and face-to-face time with the patient discussing MDD, GAD, panic disorder, insomnia and PTSD. Discussed continuing current medication regimen.  Patient advised to contact office with any questions, adverse effects, or acute worsening in signs and symptoms.  Discussed potential benefits, risk, and side effects of benzodiazepines to include potential risk of tolerance and dependence, as well as possible drowsiness.  Advised patient not to  drive if experiencing drowsiness and to take lowest possible effective dose to minimize risk of dependence and tolerance.  There are no diagnoses linked to this encounter.  Please see After Visit Summary for patient specific instructions.  Future Appointments  Date Time Provider Department Center  03/09/2024 12:00 PM Alexica Schlossberg, Angeline Mattocks, NP [facility] None  03/17/2024  4:00 PM Sherlynn Sober, LCSW [facility] None    No orders of the defined types were placed in this encounter.     -------------------------------

## 2024-03-17 ENCOUNTER — Ambulatory Visit: Admitting: Psychiatry

## 2024-03-17 DIAGNOSIS — F411 Generalized anxiety disorder: Secondary | ICD-10-CM

## 2024-03-17 NOTE — Progress Notes (Signed)
 [facility] Counselor/Therapist Progress Note  Patient ID: [name], MRN: [id number],    Date: 03/17/2024  Time Spent: 55 minutes   Treatment Type: Individual Therapy  Reported Symptoms: anxiety, some depression, anger and frustration within the family   Mental Status Exam:  Appearance:   Casual and Neat     Behavior:  Appropriate, Sharing, and Motivated  Motor:  Normal  Speech/Language:   Clear and Coherent  Affect:  Depressed and anxious  Mood:  anxious and depressed  Thought process:  goal directed  Thought content:    WNL  Sensory/Perceptual disturbances:    WNL  Orientation:  oriented to person, place, time/date, situation, day of week, month of year, year, and stated date of Sept. 4, 2025  Attention:  Good  Concentration:  Good  Memory:  WNL  Fund of knowledge:   Good  Insight:    Good  Judgment:   Good  Impulse Control:  Good   Risk Assessment: Danger to Self:  No Self-injurious Behavior: No Danger to Others: No Duty to Warn:no Physical Aggression / Violence:No  Access to Firearms a concern: No  Gang Involvement:No   Subjective:  Patient in today mainly due to her anxiety and some depression, some irritability, difficult relationship with husband and stressors re: elderly mother who has been transferred to assisted living (age 67). Patient to have hand surgery in October at [facility]. Really stressed with her mother and some of the situation revolving around her behaviors and care. (Not all details included in this note due to patient privacy needs.) Patient needing to vent concerns today about her Mom today and this did seem helpful to patient to feel heard and understood, while also being able to consider some healthy options from therapist regarding their relationship boundaries going forward.  Encouraged patient's open venting of her hurt, frustration, and some anger, however was more calm by end of session and identifying ways that she needs to be more  proactive in her relationship with mother.  Reports depression is not as heavy.  Less feelings overall of resentment within family.  To return within 2-3 weeks.   Interventions: Cognitive Behavioral Therapy, Solution-Oriented/Positive Psychology, and Ego-Supportive Long term goal: Develop healthy cognitive patterns and beliefs about self and the world that lead to alleviation of depression and anxiety and help prevent relapse.  Progressing: Patient is motivated. Struggling to focus some due to immediate, unexpected health concern with husband.  Short term goal: Learn and implement personal skills for managing stress, solving daily problems, and resolving conflicts effectively. Strategy: Teach patient calming skills, problem solving skills, and conflict resolution skills, to better manage daily stressors   Diagnosis:   ICD-10-CM   1. Generalized anxiety disorder  F41.1      Plan:  Patient today focusing on her anxiety and depression and especially how the relationship with her husband continues to be stressful.  Able to look at some of the ways she might be contributing to some of the difficulty between her and husband, and also looking at what she reports he is doing that is aggravating their marriage.  Patient actually very open in taking some ownership in the irritable issues as well as looking at what patient identifies as husband's issues.  Husband has told her that he is not willing to go for marital therapy.  Patient wants to be able to give things a little bit of time and then revisit the issue with husband.  In the meantime she  continues in individual therapy, motivated and working on her goals as identified above.  Patient does acknowledge that her reduced work hours (which is about half time) has benefited her emotionally and physically.  Reminded and encouraged patient to be practicing more positive and self affirming behaviors as noted in session today including: Taking her  medications as prescribed, taking breaks as needed throughout the day to experience more calmness, use of deep breathing exercises, exercising on her bike to help deal with stress, setting limits to better manage her stress, making healthy sleep patterns a priority, stay in touch with supportive people, work on letting go of negativity and over worrying, healthy boundaries with others, and realize the strength she can show when working with goal-directed behaviors to move in a direction that supports her overall improved emotional health, her outlook, and to gain a sense of hopefulness about her future.  Goal review and progress/challenges noted with patient.  Next appointment within 3 weeks.   Barnie Bunde, LCSW

## 2024-03-18 ENCOUNTER — Other Ambulatory Visit: Payer: Self-pay | Admitting: Internal Medicine

## 2024-03-19 ENCOUNTER — Other Ambulatory Visit (HOSPITAL_COMMUNITY): Payer: Self-pay

## 2024-03-21 ENCOUNTER — Other Ambulatory Visit: Payer: Self-pay

## 2024-03-21 ENCOUNTER — Other Ambulatory Visit (HOSPITAL_COMMUNITY): Payer: Self-pay

## 2024-03-21 MED ORDER — HYDROCORTISONE (PERIANAL) 2.5 % EX CREA
1.0000 | TOPICAL_CREAM | Freq: Two times a day (BID) | CUTANEOUS | 1 refills | Status: AC
  Filled 2024-03-21: qty 30, 10d supply, fill #0
  Filled 2024-06-17: qty 30, 10d supply, fill #1

## 2024-03-27 ENCOUNTER — Encounter: Payer: Self-pay | Admitting: Family Medicine

## 2024-03-28 ENCOUNTER — Other Ambulatory Visit (HOSPITAL_COMMUNITY): Payer: Self-pay

## 2024-03-29 ENCOUNTER — Other Ambulatory Visit: Payer: Self-pay

## 2024-03-30 ENCOUNTER — Other Ambulatory Visit (HOSPITAL_COMMUNITY): Payer: Self-pay

## 2024-04-01 ENCOUNTER — Other Ambulatory Visit: Payer: Self-pay

## 2024-04-01 ENCOUNTER — Other Ambulatory Visit (HOSPITAL_COMMUNITY): Payer: Self-pay

## 2024-04-01 MED ORDER — PROGESTERONE MICRONIZED 100 MG PO CAPS
100.0000 mg | ORAL_CAPSULE | Freq: Every day | ORAL | 1 refills | Status: AC
  Filled 2024-04-01 – 2024-06-17 (×2): qty 60, 30d supply, fill #0
  Filled 2024-06-18: qty 60, 30d supply, fill #1

## 2024-04-12 ENCOUNTER — Ambulatory Visit (INDEPENDENT_AMBULATORY_CARE_PROVIDER_SITE_OTHER): Admitting: Psychiatry

## 2024-04-12 DIAGNOSIS — F411 Generalized anxiety disorder: Secondary | ICD-10-CM | POA: Diagnosis not present

## 2024-04-12 NOTE — Progress Notes (Signed)
 [facility] Counselor/Therapist Progress Note  Patient ID: [name], MRN: [id number],    Date: 04/12/2024  Time Spent: 53 minutes   Treatment Type: Individual Therapy  Reported Symptoms: anxiety, some depression, anger and frustration within the family   Mental Status Exam:  Appearance:   Casual     Behavior:  Appropriate, Sharing, and Motivated  Motor:  Normal  Speech/Language:   Clear and Coherent  Affect:  Depressed and anxious  Mood:  anxious and depressed  Thought process:  goal directed  Thought content:    Rumination  Sensory/Perceptual disturbances:    WNL  Orientation:  oriented to person, place, time/date, situation, day of week, month of year, year, and stated date of Sept. 30, 2025  Attention:  Good  Concentration:  Good  Memory:  WNL  Fund of knowledge:   Good  Insight:    Good  Judgment:   Good  Impulse Control:  Good   Risk Assessment: Danger to Self:  No Self-injurious Behavior: No Danger to Others: No Duty to Warn:no Physical Aggression / Violence:No  Access to Firearms a concern: No  Gang Involvement:No   Subjective:   Patient in session today focusing further on her anxiety, irritability, difficult relationship with husband, stressors with her elderly mother age 80 and living in assisted living arrangement, and some depression. Patient enjoying some skating as a hobby. Issues continue in relationship with her husband and their marital stress and communication issues which she processed in session today and how it impacts her. Working through frustrations, anger, irritability, and anxiety re: mother and patient's difficulty coping. Is set to have thumb surgery at [facility] October 27th, outpatient. Anxious about surgery and talked through her apprehension in session today which seemed helpful to patient, and encouraged her to stay more in the present for right now and not jump too far into the future. Needing part of session today to share and  discuss additional family/mother issues which was helpful to patient in looking at some what I can control and what I cannot control. Feels her depression is some less, along with family resentment.   Interventions: Cognitive Behavioral Therapy, Solution-Oriented/Positive Psychology, and Ego-Supportive Long term goal: Develop healthy cognitive patterns and beliefs about self and the world that lead to alleviation of depression and anxiety and help prevent relapse.  Progressing: Patient is motivated. Struggling to focus some due to immediate, unexpected health concern with husband.  Short term goal: Learn and implement personal skills for managing stress, solving daily problems, and resolving conflicts effectively. Strategy: Teach patient calming skills, problem solving skills, and conflict resolution skills, to better manage daily stressors   Diagnosis:   ICD-10-CM   1. Generalized anxiety disorder  F41.1      Plan:   Patient today working further on her anxiety and depression especially how the symptoms relate to her marital relationship with husband which continues to be stressful.  Communication with husband continues to be challenging and it is very difficult for the parties involved to really listen and hear/try to understand what is being said. Getting ready for her upcoming thumb surgery and trying  to get mentally prepared at this point. Is feeling optimistic about her surgery and ready to move forward. Reminded and encouraged patient to be practicing more positive and self affirming behaviors as noted in session today including: Taking breaks as needed throughout the day to experience more calmness, use of deep breathing exercises, taking her medications as prescribed, exercising on  her bike to help deal with stress, setting limits to better manage her stress, making healthy sleep patterns a priority, stay in touch with supportive people, work on letting go of negativity and  over-worrying, healthy boundaries with others, and recognize the strength she can show when working with goal-directed behaviors to help move in a direction that supports her overall improved emotional health, her outlook, and to gain/support a sense of hopefulness about her future.  Goal review and progress/challenges noted with patient.  Next appointment within 3 weeks.   Barnie Bunde, LCSW

## 2024-04-13 HISTORY — PX: OTHER SURGICAL HISTORY: SHX169

## 2024-04-19 ENCOUNTER — Other Ambulatory Visit: Payer: Self-pay

## 2024-04-19 ENCOUNTER — Other Ambulatory Visit (HOSPITAL_COMMUNITY): Payer: Self-pay

## 2024-05-09 ENCOUNTER — Other Ambulatory Visit: Payer: Self-pay

## 2024-05-09 ENCOUNTER — Other Ambulatory Visit (HOSPITAL_COMMUNITY): Payer: Self-pay

## 2024-05-09 MED ORDER — OXYCODONE HCL 5 MG PO TABS
5.0000 mg | ORAL_TABLET | ORAL | 0 refills | Status: DC | PRN
  Filled 2024-05-09 (×2): qty 15, 3d supply, fill #0

## 2024-05-10 ENCOUNTER — Other Ambulatory Visit: Payer: Self-pay

## 2024-05-25 ENCOUNTER — Encounter: Payer: Self-pay | Admitting: Family Medicine

## 2024-05-25 ENCOUNTER — Other Ambulatory Visit: Payer: Self-pay

## 2024-05-25 DIAGNOSIS — M1811 Unilateral primary osteoarthritis of first carpometacarpal joint, right hand: Secondary | ICD-10-CM

## 2024-05-26 ENCOUNTER — Ambulatory Visit (INDEPENDENT_AMBULATORY_CARE_PROVIDER_SITE_OTHER): Admitting: Psychiatry

## 2024-05-26 DIAGNOSIS — F411 Generalized anxiety disorder: Secondary | ICD-10-CM | POA: Diagnosis not present

## 2024-05-26 NOTE — Progress Notes (Signed)
 [facility] Counselor/Therapist Progress Note  Patient ID: [name], MRN: [id number],    Date: 05/26/2024  Time Spent: 50 minutes   Treatment Type: Individual Therapy  Reported Symptoms: anxiety, depression, anger, frustration   Mental Status Exam:  Appearance:   Casual and Neat     Behavior:  Appropriate, Sharing, and Motivated  Motor:  Normal  Speech/Language:   Clear and Coherent  Affect:  Depressed, Tearful, and angry  Mood:  angry, anxious, depressed, and sad  Thought process:  goal directed  Thought content:    Rumination  Sensory/Perceptual disturbances:    WNL  Orientation:  oriented to person, place, time/date, situation, day of week, month of year, year, and stated date of Nov. 13, 2025  Attention:  Good  Concentration:  Good  Memory:  WNL  Fund of knowledge:   Good  Insight:    Good  Judgment:   Good  Impulse Control:  Good/Fair   Risk Assessment: Danger to Self:  No Self-injurious Behavior: No Danger to Others: No Duty to Warn:no Physical Aggression / Violence:No  Access to Firearms a concern: No  Gang Involvement:No   Subjective: Patient in today feeling angry, sad, depressed, and frustrated. Some tearfulness. Very Upset upon arrival to appt today. Recent surgery on her hand an reports having a lot of unnecessary issues with her short term disability not paying. Very upset and talked through her anger and frustration today in session. Denies any thoughts to harm self or anyone else after significant processing of her anger and sadness, Patient was able to  be and talk more calmly. Does have instructions on what office to go to and plans to go there after this appt to hopefully provide them with more info they need to help patient in getting her benefits.  Patient much more calm upon leaving today and grateful for the contact and support.  She is scheduling a return appointment within approximately 2 weeks and can call sooner if  needed.   Interventions: Cognitive Behavioral Therapy, Solution-Oriented/Positive Psychology, and Ego-Supportive Long term goal: Develop healthy cognitive patterns and beliefs about self and the world that lead to alleviation of depression and anxiety and help prevent relapse.  Progressing: Patient is motivated. Struggling to focus some due to immediate, unexpected health concern with husband.  Short term goal: Learn and implement personal skills for managing stress, solving daily problems, and resolving conflicts effectively. Strategy: Teach patient calming skills, problem solving skills, and conflict resolution skills, to better manage daily stressors    Diagnosis:   ICD-10-CM   1. Generalized anxiety disorder  F41.1      Plan: Patient seen today after calling into the office very upset regarding some postsurgery issues with insurance companies.  Very upset initially, tearful, and not knowing where to turn.  She was able to use session to talk through her hurt, anger, lack of knowledge about what to do, and overall frustration.  Much calmer by end of session and has a plan as to who she is needing to talk to this afternoon to try and get this situation resolved.  She has followed all the directions that were given to her and is feeling a little more hopeful going and talking to [employer] as she leaves this office.  Patient reminded and encouraged to be practicing more positive and self affirming behaviors as noted in session today including: Taking breaks as needed throughout the day to experience more calmness, use of deep breathing  exercises, taking her medications as prescribed, exercising on her bike to help deal with stress, setting limits to better manage her stress, making healthy sleep patterns a priority, stay in touch with supportive people, working on letting go of her negativity and over worrying, healthy boundaries with others, and realize the strengths she can show  when working with goal-directed behaviors to help move in a direction that supports her overall improved emotional health, outlook, and to support a sense of hopefulness about her future.  Goal review and progress/challenges noted with patient.  Next appointment within 3 weeks.   Barnie Bunde, LCSW

## 2024-06-07 NOTE — Patient Instructions (Signed)
 Return in about 1 year (around 06/09/2025) for chronic condition follow-up with PCP in separate yearly Medicare wellness visits with health coach.        Great to see you today.  I have refilled the medication(s) we provide.   If labs were collected or images ordered, we will inform you of  results once we have received them and reviewed. We will contact you either by echart message, or telephone call.  Please give ample time to the testing facility, and our office to run,  receive and review results. Please do not call inquiring of results, even if you can see them in your chart. We will contact you as soon as we are able. If it has been over 1 week since the test was completed, and you have not yet heard from us , then please call us .    - echart message- for normal results that have been seen by the patient already.   - telephone call: abnormal results or if patient has not viewed results in their echart.  If a referral to a specialist was entered for you, please call us  in 2 weeks if you have not heard from the specialist office to schedule.

## 2024-06-07 NOTE — Progress Notes (Unsigned)
 Welcome to [facility] preventative visit   Patient Care Team    Relationship Specialty Notifications Start End  Catherine Charlies LABOR, DO PCP - General Family Medicine  12/29/18   Ishmael Slough, MD Consulting Physician Rheumatology  04/09/17   Tricia Tawni CROME, MD Referring Physician Dermatology  04/09/17   Group, [facility]  12/29/18    Comment: Dr. mavis Commander, Carol BRAVO, MD Consulting Physician Gastroenterology  12/29/18   Louis Shove, MD Consulting Physician Neurosurgery  12/29/18   Charlie Croak Consulting Physician Gynecologic Oncology  07/14/21   Abigail Maude POUR  Optometry  06/08/24     Chief Complaint  Patient presents with   Welcome to Medicare preventative visit    Pt is fasting.  Influenza vaccine- Oct.  Chronic condition management Has Advance Directive- getting us  a copy    History of Present Ilness: [name], 68 y.o. , female presents today for welcome to preventative Medicare wellness visit.   Health maintenance:  Colonoscopy: completed 04/03/2023, 10 yr follow up Dr. Commander. Mammogram: 10/28/2023 [facility]-ordered for-bilateral screening mammogram in April of 2026 is recommended.  Per radiology Immunizations: tdap UTD 2022, Influenza UTD 04/2024(encouraged yearly),shingrix  completed, covid series completed, PNA20 completed Infectious disease screening: Hep C completed DEXA: last completed 2024 at gyn> osteoporosis q2 yr - Dr. Croak. Declined fosamax for now.  Patient has a Dental home. Hospitalizations/ED visits: Reviewed Glaucoma screen: yearly- Dr. Abigail  Palpitations: Patient reports compliance with atenolol  25 mg daily Raynauds: Patient uses amlodipine  as needed B12 deficiency: B12 injections are being performed at home.   Past medical, surgical, family and social histories reviewed (including experiences with illnesses, hospital stays, operations, injuries, and treatments):  Past Medical History:  Diagnosis Date    Allergy    Anemia    after birth of child 27yrs ago   Anxiety and depression    with insomnia   Autoimmune disease 08/06/2020   Blood transfusion without reported diagnosis    x3   Chronic back pain    ? RA, ? Lupus - reports saw rheumatologist in the past but better now, sees Dr. Malcolm   Eczema    Flushing 05/27/2021   GERD 02/13/2007   Qualifier: Diagnosis of  By: Norleen MD, Lyn[place] ORN    GERD (gastroesophageal reflux disease)    takes Omeprazole  daily, with nausea   Greater trochanteric bursitis of left hip 03/25/2019   Injected March 25, 2019   Heart murmur    Hemorrhoids    History of kidney stones    passed on her own   History of migraine    last one a yr ago and takes Zomig  prn   Hot flashes 11/10/2014   HTN (hypertension) 03/31/2012   Hx of echocardiogram    Echo (8/15):  EF 55-60%, no RWMA   IBS (irritable bowel syndrome)    constipation predominant   Interstitial cystitis    hx of UTI   Irritable bowel syndrome 12/14/2009   Qualifier: Diagnosis of  By: Commander MD, NOLIA [name]    Memory loss 12/22/2016   Numbness    both legs and related to back   Osteoporosis    osteopenia   Palpitations    on metoprolol  in past but made BP too low   Pneumonia    hx of;last time 64yrs ago   PONV (postoperative nausea and vomiting)    laryngospasm-1999   Poor vision 10/15/2015   -? Ocular rosacea -sees opthomology  Raynaud's disease 10/21/2012   Raynaud's disease /phenomenon 10/21/2012   no meds, worse in the winter or cold environment.    Rheumatoid aortitis    uses Diclofenac gel;Rheumatoid   Seasonal allergies    Flonase  daily    Strain of left piriformis muscle 05/18/2019   Urinary incontinence    All allergies reviewed Allergies  Allergen Reactions   Bovine (Beef) Protein Other (See Comments)   Aspartame And Phenylalanine Other (See Comments)    Migraines   Bovine (Beef) Protein-Containing Drug Products Other (See Comments)    severe cramping   Citrus  Other (See Comments)    Causes Interstitial cystitis flares   Penicillins Hives and Rash    Denies airway involvement Has patient had a PCN reaction causing immediate rash, facial/tongue/throat swelling, SOB or lightheadedness with hypotension: yes hives.  Has patient had a PCN reaction causing severe rash involving mucus membranes or skin necrosis:no Has patient had a PCN reaction that required hospitalization No Has patient had a PCN reaction occurring within the last 10 years: No If all of the above answers are NO, then may proceed with Cephalosporin use.    Promethazine Hcl Other (See Comments)    Muscle twitching and contracture    Beta Vulgaris Other (See Comments)    beets   Bioflavonoids Other (See Comments)   Cat Dander Other (See Comments)   Adhesive [Tape] Other (See Comments)    Red splotches   Morphine Sulfate Nausea And Vomiting   Past Surgical History:  Procedure Laterality Date   ABDOMINAL HYSTERECTOMY  1997   BREAST BIOPSY  1999   benign   COLONOSCOPY  02/11/2013 and 12/24/04   internal and external hemorrhoids   ENDOMETRIAL ABLATION     EXCISION MORTON'S NEUROMA Right    FLEXIBLE SIGMOIDOSCOPY  03/07/2008   internal and external hemorrhoids   POSTERIOR LUMBAR FUSION  05/02/2013   L4-L5 fusion (cage/screws/bone graft) Dr. Louis   PR ARTHRP INTERCARPAL/CARP/MTCRPL JT SUSPENSION  ARTHROPLASTY, INTERCARPAL OR CARPOMETACARPAL JOINTS; SUSPENSION, INCLUDING TRANSFER OR TRANSPLANT OF TENDON, WITH INTERPOSITION, WHEN PERFORMED  04/2024   TONSILLECTOMY     UPPER GASTROINTESTINAL ENDOSCOPY  10/23/2010   gastritis, irregular Z-line   wisdom teeth extracted     Family History  Problem Relation Age of Onset   Diverticulosis Mother    Hypertension Mother    Osteoarthritis Mother    Depression Mother    Hyperlipidemia Mother    Kidney disease Mother    Coronary artery disease Father    Heart attack Father    Hypertension Father    Depression Father    Hyperlipidemia  Father    Alcohol abuse Father    Early death Father    Kidney disease Father    Mental illness Father    Alcohol abuse Sister    Depression Sister    Colon polyps Sister    Kidney disease Sister    Alcohol abuse Daughter    Depression Daughter    Drug abuse Daughter    Alcohol abuse Son    Depression Son    Diabetes Paternal Uncle    Hypertension Maternal Grandmother    Stroke Maternal Grandmother    Hypertension Maternal Grandfather    Pancreatic cancer Maternal Grandfather    Early death Maternal Grandfather    Depression Paternal Grandmother    Dementia Paternal Grandmother    Arthritis Paternal Grandmother    Mental illness Paternal Grandmother    Stroke Paternal Grandfather    Hyperlipidemia Paternal Actor  Alcohol abuse Paternal Grandfather    Early death Paternal Grandfather    Hypertension Paternal Grandfather    Colon cancer Neg Hx    Social History   Social History Narrative   Spiritual Beliefs: methodist   Marital status/children/pets: In second marriage.  First husband committed suicide.  2 children.   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   Safety:      -Wears a bicycle helmet riding a bike: Yes     -smoke alarm in the home:No     - wears seatbelt: Yes     - Feels safe in their relationships: Yes             All medications verified Allergies as of 06/08/2024       Reactions   Bovine (beef) Protein Other (See Comments)   Aspartame And Phenylalanine Other (See Comments)   Migraines   Bovine (beef) Protein-containing Drug Products Other (See Comments)   severe cramping   Citrus Other (See Comments)   Causes Interstitial cystitis flares   Penicillins Hives, Rash   Denies airway involvement Has patient had a PCN reaction causing immediate rash, facial/tongue/throat swelling, SOB or lightheadedness with hypotension: yes hives.  Has patient had a PCN reaction causing severe rash involving mucus membranes or skin  necrosis:no Has patient had a PCN reaction that required hospitalization No Has patient had a PCN reaction occurring within the last 10 years: No If all of the above answers are NO, then may proceed with Cephalosporin use.   Promethazine Hcl Other (See Comments)   Muscle twitching and contracture    Beta Vulgaris Other (See Comments)   beets   Bioflavonoids Other (See Comments)   Cat Dander Other (See Comments)   Adhesive [tape] Other (See Comments)   Red splotches   Morphine Sulfate Nausea And Vomiting        Medication List        Accurate as of June 08, 2024  9:39 AM. If you have any questions, ask your nurse or doctor.          STOP taking these medications    albuterol  108 (90 Base) MCG/ACT inhaler Commonly known as: VENTOLIN  HFA Stopped by: Charlies Bellini   esomeprazole  40 MG capsule Commonly known as: NEXIUM  Stopped by: Charlies Bellini   nitrofurantoin  (macrocrystal-monohydrate) 100 MG capsule Commonly known as: Macrobid  Stopped by: Charlies Bellini   oxyCODONE  5 MG immediate release tablet Commonly known as: Oxy IR/ROXICODONE  Stopped by: Charlies Bellini       TAKE these medications    ALPRAZolam  0.5 MG tablet Commonly known as: Xanax  Take 1 tablet (0.5 mg total) by mouth 3 (three) times daily as needed for anxiety.   amLODipine  2.5 MG tablet Commonly known as: NORVASC  Take 1 tablet (2.5 mg total) by mouth daily.   atenolol  25 MG tablet Commonly known as: TENORMIN  Take 1 tablet (25 mg total) by mouth daily.   B-D 3CC LUER-LOK SYR 25GX1 25G X 1 3 ML Misc Generic drug: SYRINGE-NEEDLE (DISP) 3 ML Use as directed to inject b-12 into the skin   cyanocobalamin  1000 MCG/ML injection Commonly known as: VITAMIN B12 Inject 1 mL (1,000 mcg total) into the muscle every 14 (fourteen) days.   ketoconazole  2 % shampoo Commonly known as: NIZORAL  Use topically as directed for 30 days   lamoTRIgine  200 MG tablet Commonly known as: LAMICTAL  Take 1 tablet  (200 mg total) by mouth at bedtime.   meloxicam  15 MG tablet Commonly known as: MOBIC   Take 1 tablet (15 mg total) by mouth daily for joint pain.   omeprazole  40 MG capsule Commonly known as: PRILOSEC Take 1 capsule (40 mg total) by mouth daily.   Proctozone -HC 2.5 % rectal cream Generic drug: hydrocortisone  Insert rectally 2 times daily for 10 days   progesterone  100 MG capsule Commonly known as: PROMETRIUM  Take 1-2 capsules (100-200 mg total) by mouth at bedtime.   QUEtiapine  50 MG tablet Commonly known as: SEROquel  Take 1 tablet (50 mg total) by mouth at bedtime.   Tavaborole  5 % Soln Commonly known as: Kerydin  Apply 1 drop topically daily. Apply 1 drop to the toenail daily.        Depression Screen    06/08/2024    9:13 AM 04/03/2023    9:21 AM 09/08/2022    1:13 PM 05/29/2022   10:44 AM 01/01/2022    2:16 PM  Depression screen PHQ 2/9  Decreased Interest 0 0 0 0 0  Down, Depressed, Hopeless 1 2 1  0 0  PHQ - 2 Score 1 2 1  0 0  Altered sleeping 0 3 3 3 1   Tired, decreased energy 0  1 0 0  Change in appetite 0 0 1 0 0  Feeling bad or failure about yourself  0 1 0 0 0  Trouble concentrating 0 0 0 0 0  Moving slowly or fidgety/restless 0 2 0 0 0  Suicidal thoughts 0 0 0 0 0  PHQ-9 Score 1 8  6  3  1    Difficult doing work/chores Not difficult at all Not difficult at all Not difficult at all       Data saved with a previous flowsheet row definition      06/08/2024    9:13 AM 04/03/2023    9:22 AM 05/29/2022   10:45 AM 01/01/2022    2:17 PM  GAD 7 : Generalized Anxiety Score  Nervous, Anxious, on Edge 2 3 2 2   Control/stop worrying 2 3 3 1   Worry too much - different things 2 3 3 1   Trouble relaxing 1 3 1 1   Restless 1 2 1 1   Easily annoyed or irritable 1 2 1 1   Afraid - awful might happen 1 1 0 0  Total GAD 7 Score 10 17 11 7   Anxiety Difficulty Not difficult at all Somewhat difficult       Immunizations: Immunization History  Administered Date(s)  Administered   Fluad Quad(high Dose 65+) 04/23/2022   Hep B, Unspecified 07/03/1999, 08/05/1999   INFLUENZA, HIGH DOSE SEASONAL PF 05/03/2024   Influenza,inj,Quad PF,6+ Mos 04/12/2014   Influenza-Unspecified 05/15/2015, 03/28/2018, 04/20/2020, 04/13/2021, 04/14/2023   PFIZER(Purple Top)SARS-COV-2 Vaccination 08/04/2019, 08/23/2019   PNEUMOCOCCAL CONJUGATE-20 05/29/2022   Td 07/15/2003   Tdap 01/11/2021   Zoster Recombinant(Shingrix ) 04/27/2019, 09/26/2019    Cognitive Function      06/08/2024    9:00 AM  6CIT Screen  What Year? 0 points  What month? 0 points  What time? 0 points  Count back from 20 0 points  Months in reverse 0 points  Repeat phrase 0 points  Total Score 0 points    Exercise: Current Exercise Habits: Home exercise routine, Type of exercise: walking, Time (Minutes): 30, Frequency (Times/Week): 5, Weekly Exercise (Minutes/Week): 150, Intensity: Moderate Exercise limited by: None identified Diet: Regular  Functional Status Survey: Get up and go test: steady and less than 20 seconds.  Is the patient deaf or have difficulty hearing?: No Does the patient have difficulty seeing,  even when wearing glasses/contacts?: No Does the patient have difficulty concentrating, remembering, or making decisions?: No Does the patient have difficulty walking or climbing stairs?: No Does the patient have difficulty dressing or bathing?: No Does the patient have difficulty doing errands alone such as visiting a doctor's office or shopping?: No Current Exercise Habits: Home exercise routine, Type of exercise: walking, Time (Minutes): 30, Frequency (Times/Week): 5, Weekly Exercise (Minutes/Week): 150, Intensity: Moderate Exercise limited by: None identified    06/08/2024    9:00 AM 04/03/2023    9:21 AM 09/08/2022    1:13 PM 07/28/2022   10:41 AM 01/01/2022    2:06 PM  Fall Risk   Falls in the past year? 0 1 0 0 0  Number falls in past yr: 0 1 0 0 0  Injury with Fall? 0 1 0 0 0   Risk for fall due to :  History of fall(s) No Fall Risks No Fall Risks No Fall Risks  Follow up Falls evaluation completed  Falls evaluation completed Falls evaluation completed  Falls evaluation completed      Data saved with a previous flowsheet row definition   Physical Activity: Sufficiently Active (06/08/2024)   Exercise Vital Sign    Days of Exercise per Week: 5 days    Minutes of Exercise per Session: 60 min    Social Connections: Moderately Integrated (06/08/2024)   Social Connection and Isolation Panel    Frequency of Communication with Friends and Family: Twice a week    Frequency of Social Gatherings with Friends and Family: Once a week    Attends Religious Services: Never    Database Administrator or Organizations: Yes    Attends Engineer, Structural: 1 to 4 times per year    Marital Status: Married   Social Drivers of Health with Concerns   Financial Resource Strain: Not on file  Housing: Unknown (06/08/2024)   Housing Stability Vital Sign    Unable to Pay for Housing in the Last Year: No    Number of Times Moved in the Last Year: Not on file    Homeless in the Last Year: No   Tobacco Use: Low Risk  (06/08/2024)   Patient History    Smoking Tobacco Use: Never    Smokeless Tobacco Use: Never    Passive Exposure: Not on file   @alcohol @ Flowsheet Row Office Visit from 06/08/2024 in [facility]  AUDIT-C Score 0    Advanced Directives: has an advanced directive - a copy HAS NOT been provided. She will bring it.   Review of Systems  Constitutional: Negative.   HENT: Negative.    Eyes: Negative.   Respiratory: Negative.    Cardiovascular: Negative.   Gastrointestinal: Negative.   Genitourinary: Negative.   Musculoskeletal: Negative.   Skin: Negative.   Neurological: Negative.   Endo/Heme/Allergies: Negative.   Psychiatric/Behavioral: Negative.    All other systems reviewed and are negative.   Objective   BP  118/74   Pulse 69   Temp 98 F (36.7 C)   Ht 5' 2 (1.575 m)   Wt 117 lb 12.8 oz (53.4 kg)   SpO2 97%   BMI 21.55 kg/m  Physical Exam Vitals and nursing note reviewed.  Constitutional:      General: She is not in acute distress.    Appearance: Normal appearance. She is not ill-appearing or toxic-appearing.  HENT:     Head: Normocephalic and atraumatic.  Right Ear: Tympanic membrane, ear canal and external ear normal. There is no impacted cerumen.     Left Ear: Tympanic membrane, ear canal and external ear normal. There is no impacted cerumen.     Nose: No congestion or rhinorrhea.     Mouth/Throat:     Mouth: Mucous membranes are moist.     Pharynx: Oropharynx is clear. No oropharyngeal exudate or posterior oropharyngeal erythema.  Eyes:     General: No scleral icterus.       Right eye: No discharge.        Left eye: No discharge.     Extraocular Movements: Extraocular movements intact.     Conjunctiva/sclera: Conjunctivae normal.     Pupils: Pupils are equal, round, and reactive to light.  Cardiovascular:     Rate and Rhythm: Normal rate and regular rhythm.     Pulses: Normal pulses.     Heart sounds: Normal heart sounds. No murmur heard.    No friction rub. No gallop.  Pulmonary:     Effort: Pulmonary effort is normal. No respiratory distress.     Breath sounds: Normal breath sounds. No stridor. No wheezing, rhonchi or rales.  Chest:     Chest wall: No tenderness.  Abdominal:     General: Abdomen is flat. Bowel sounds are normal. There is no distension.     Palpations: Abdomen is soft. There is no mass.     Tenderness: There is no abdominal tenderness. There is no right CVA tenderness, left CVA tenderness, guarding or rebound.     Hernia: No hernia is present.  Musculoskeletal:        General: No swelling, tenderness or deformity. Normal range of motion.     Cervical back: Normal range of motion and neck supple. No rigidity or tenderness.     Right lower leg: No  edema.     Left lower leg: No edema.  Lymphadenopathy:     Cervical: No cervical adenopathy.  Skin:    General: Skin is warm and dry.     Coloration: Skin is not jaundiced or pale.     Findings: No bruising, erythema, lesion or rash.  Neurological:     General: No focal deficit present.     Mental Status: She is alert and oriented to person, place, and time. Mental status is at baseline.     Cranial Nerves: No cranial nerve deficit.     Sensory: No sensory deficit.     Motor: No weakness.     Coordination: Coordination normal.     Gait: Gait normal.     Deep Tendon Reflexes: Reflexes normal.  Psychiatric:        Mood and Affect: Mood normal.        Behavior: Behavior normal.        Thought Content: Thought content normal.        Judgment: Judgment normal.     Hearing Screening   500Hz  1000Hz  2000Hz  3000Hz  4000Hz   Right ear 25 25 25 25 25   Left ear 25 25 25 25 25    Vision Screening   Right eye Left eye Both eyes  Without correction 20/20 20/30 20/20   With correction       Assessment and Plan: HAYVEN CROY is a 68 y.o. patient present today for their welcome to Medicare preventative wellness Exam  and combination chronic medical condition follow-up visit Colonoscopy: completed 04/03/2023, 10 yr follow up Dr. Avram. Mammogram: 10/28/2023 [facility] for-bilateral screening mammogram in April  of 2026 is recommended.  Per radiology Immunizations: tdap UTD 2022, Influenza UTD 04/2024(encouraged yearly),shingrix  completed, covid series completed, PNA20 completed Infectious disease screening: Hep C completed DEXA: last completed 2020 at gyn Patient was encouraged to exercise greater than 150 minutes a week. Patient was encouraged to choose a diet filled with fresh fruits and vegetables, and lean meats. AVS provided to patient today for education/recommendation on gender specific health and safety maintenance. Vision and hearing passed.  B12 deficiency Performs home B12  injections q. 14 days B12 collected today Heart palpitations Stable Continue atenolol  25 mg daily as needed  Raynaud's-elevated BP: Stable Continue amlodipine  2.5 mg daily as needed  Hormone replacement therapy/hot flashes Follows with gynecology  Tobacco Use: Low Risk  (06/08/2024)   Patient History    Smoking Tobacco Use: Never    Smokeless Tobacco Use: Never    Passive Exposure: Not on file   The patient is not currently a tobacco user. Counseling given: Not Answered    Goals      Patient Stated     Stay healthy and active.        I have personally reviewed and noted the following in the patient's chart:   Medical and social history Use of alcohol, tobacco or illicit drugs  Current medications and supplements Functional ability and status Nutritional status Physical activity Advanced directives List of other physicians Hospitalizations, surgeries, and ER visits in previous 12 months Vitals Screenings to include cognitive, depression, and falls Referrals and appointments  In addition, I have reviewed and discussed with patient certain preventive protocols, quality metrics, and best practice recommendations. A written personalized care plan for preventive services as well as general preventive health recommendations were provided to patient.     Electronically Signed by: Charlies Bellini, DO  Primary Care- OR

## 2024-06-08 ENCOUNTER — Other Ambulatory Visit (HOSPITAL_COMMUNITY): Payer: Self-pay

## 2024-06-08 ENCOUNTER — Encounter: Payer: Self-pay | Admitting: Family Medicine

## 2024-06-08 ENCOUNTER — Other Ambulatory Visit: Payer: Self-pay

## 2024-06-08 ENCOUNTER — Ambulatory Visit: Admitting: Family Medicine

## 2024-06-08 VITALS — BP 118/74 | HR 69 | Temp 98.0°F | Ht 62.0 in | Wt 117.8 lb

## 2024-06-08 DIAGNOSIS — E538 Deficiency of other specified B group vitamins: Secondary | ICD-10-CM

## 2024-06-08 DIAGNOSIS — Z131 Encounter for screening for diabetes mellitus: Secondary | ICD-10-CM | POA: Diagnosis not present

## 2024-06-08 DIAGNOSIS — Z79890 Hormone replacement therapy (postmenopausal): Secondary | ICD-10-CM | POA: Diagnosis not present

## 2024-06-08 DIAGNOSIS — R799 Abnormal finding of blood chemistry, unspecified: Secondary | ICD-10-CM | POA: Diagnosis not present

## 2024-06-08 DIAGNOSIS — R030 Elevated blood-pressure reading, without diagnosis of hypertension: Secondary | ICD-10-CM

## 2024-06-08 DIAGNOSIS — Z23 Encounter for immunization: Secondary | ICD-10-CM

## 2024-06-08 DIAGNOSIS — R002 Palpitations: Secondary | ICD-10-CM

## 2024-06-08 DIAGNOSIS — R232 Flushing: Secondary | ICD-10-CM | POA: Diagnosis not present

## 2024-06-08 DIAGNOSIS — Z13220 Encounter for screening for lipoid disorders: Secondary | ICD-10-CM

## 2024-06-08 DIAGNOSIS — M810 Age-related osteoporosis without current pathological fracture: Secondary | ICD-10-CM | POA: Diagnosis not present

## 2024-06-08 DIAGNOSIS — I7300 Raynaud's syndrome without gangrene: Secondary | ICD-10-CM | POA: Diagnosis not present

## 2024-06-08 DIAGNOSIS — Z1211 Encounter for screening for malignant neoplasm of colon: Secondary | ICD-10-CM

## 2024-06-08 DIAGNOSIS — Z1231 Encounter for screening mammogram for malignant neoplasm of breast: Secondary | ICD-10-CM

## 2024-06-08 DIAGNOSIS — Z0000 Encounter for general adult medical examination without abnormal findings: Secondary | ICD-10-CM | POA: Diagnosis not present

## 2024-06-08 LAB — COMPREHENSIVE METABOLIC PANEL WITH GFR
ALT: 9 U/L (ref 0–35)
AST: 17 U/L (ref 0–37)
Albumin: 4.5 g/dL (ref 3.5–5.2)
Alkaline Phosphatase: 71 U/L (ref 39–117)
BUN: 17 mg/dL (ref 6–23)
CO2: 29 meq/L (ref 19–32)
Calcium: 9.3 mg/dL (ref 8.4–10.5)
Chloride: 103 meq/L (ref 96–112)
Creatinine, Ser: 1.03 mg/dL (ref 0.40–1.20)
GFR: 56.14 mL/min — ABNORMAL LOW (ref >60.00)
Glucose, Bld: 87 mg/dL (ref 70–99)
Potassium: 4.5 meq/L (ref 3.5–5.1)
Sodium: 139 meq/L (ref 135–145)
Total Bilirubin: 0.6 mg/dL (ref 0.2–1.2)
Total Protein: 6.6 g/dL (ref 6.0–8.3)

## 2024-06-08 LAB — CBC
HCT: 44.4 % (ref 36.0–46.0)
Hemoglobin: 14.5 g/dL (ref 12.0–15.0)
MCHC: 32.7 g/dL (ref 30.0–36.0)
MCV: 93.4 fl (ref 78.0–100.0)
Platelets: 183 K/uL (ref 150.0–400.0)
RBC: 4.75 Mil/uL (ref 3.87–5.11)
RDW: 13.8 % (ref 11.5–15.5)
WBC: 4.7 K/uL (ref 4.0–10.5)

## 2024-06-08 LAB — LIPID PANEL
Cholesterol: 215 mg/dL — ABNORMAL HIGH (ref 0–200)
HDL: 74.4 mg/dL (ref >39.00)
LDL Cholesterol: 116 mg/dL — ABNORMAL HIGH (ref 0–99)
NonHDL: 140.89
Total CHOL/HDL Ratio: 3
Triglycerides: 123 mg/dL (ref 0.0–149.0)
VLDL: 24.6 mg/dL (ref 0.0–40.0)

## 2024-06-08 LAB — VITAMIN B12: Vitamin B-12: 535 pg/mL (ref 211–911)

## 2024-06-08 LAB — TSH: TSH: 4.51 u[IU]/mL (ref 0.35–5.50)

## 2024-06-08 LAB — HEMOGLOBIN A1C: Hgb A1c MFr Bld: 5.7 % (ref 4.6–6.5)

## 2024-06-08 MED ORDER — "BD LUER-LOK SYRINGE 25G X 1"" 3 ML MISC"
3 refills | Status: AC
  Filled 2024-06-08: qty 6, 90d supply, fill #0
  Filled 2024-08-10: qty 6, 84d supply, fill #1

## 2024-06-08 MED ORDER — CYANOCOBALAMIN 1000 MCG/ML IJ SOLN
1000.0000 ug | INTRAMUSCULAR | 3 refills | Status: AC
  Filled 2024-06-08 – 2024-08-10 (×2): qty 6, 84d supply, fill #0

## 2024-06-08 MED ORDER — ATENOLOL 25 MG PO TABS
25.0000 mg | ORAL_TABLET | Freq: Every day | ORAL | 3 refills | Status: AC
  Filled 2024-06-08 – 2024-07-09 (×3): qty 90, 90d supply, fill #0

## 2024-06-08 MED ORDER — AMLODIPINE BESYLATE 2.5 MG PO TABS
2.5000 mg | ORAL_TABLET | Freq: Every day | ORAL | 3 refills | Status: AC
Start: 2024-06-08 — End: ?
  Filled 2024-06-08: qty 90, 90d supply, fill #0

## 2024-06-13 ENCOUNTER — Ambulatory Visit: Payer: Self-pay | Admitting: Family Medicine

## 2024-06-13 ENCOUNTER — Ambulatory Visit (INDEPENDENT_AMBULATORY_CARE_PROVIDER_SITE_OTHER): Admitting: Psychiatry

## 2024-06-13 DIAGNOSIS — F411 Generalized anxiety disorder: Secondary | ICD-10-CM | POA: Diagnosis not present

## 2024-06-13 NOTE — Therapy (Signed)
 OUTPATIENT OCCUPATIONAL THERAPY ORTHO EVALUATION  Patient Name: [name] MRN: [id number] DOB:March 29, 1956, 68 y.o., female Today's Date: 06/14/2024  PCP: Catherine HAMS DO REFERRING PROVIDER: Claudene Arthea HERO, DO   END OF SESSION:  OT End of Session - 06/14/24 1303     Visit Number 1    Number of Visits 6    Date for Recertification  07/22/24    Authorization Type Medicare    OT Start Time 1303    OT Stop Time 1349    OT Time Calculation (min) 46 min    Equipment Utilized During Treatment orthotic materials    Activity Tolerance Patient tolerated treatment well;No increased pain;Patient limited by fatigue;Patient limited by pain    Behavior During Therapy [facility] for tasks assessed/performed          Past Medical History:  Diagnosis Date   Allergy    Anemia    after birth of child 24yrs ago   Anxiety and depression    with insomnia   Autoimmune disease 08/06/2020   Blood transfusion without reported diagnosis    x3   Chronic back pain    ? RA, ? Lupus - reports saw rheumatologist in the past but better now, sees Dr. Malcolm   Eczema    Flushing 05/27/2021   GERD 02/13/2007   Qualifier: Diagnosis of  By: Norleen MD, Lynwood ORN    GERD (gastroesophageal reflux disease)    takes Omeprazole  daily, with nausea   Greater trochanteric bursitis of left hip 03/25/2019   Injected March 25, 2019   Heart murmur    Hemorrhoids    History of kidney stones    passed on her own   History of migraine    last one a yr ago and takes Zomig  prn   Hot flashes 11/10/2014   HTN (hypertension) 03/31/2012   Hx of echocardiogram    Echo (8/15):  EF 55-60%, no RWMA   IBS (irritable bowel syndrome)    constipation predominant   Interstitial cystitis    hx of UTI   Irritable bowel syndrome 12/14/2009   Qualifier: Diagnosis of  By: Avram MD, NOLIA Lupita BRAVO    Memory loss 12/22/2016   Numbness    both legs and related to back   Osteoporosis    osteopenia   Palpitations    on metoprolol   in past but made BP too low   Pneumonia    hx of;last time 45yrs ago   PONV (postoperative nausea and vomiting)    laryngospasm-1999   Poor vision 10/15/2015   -? Ocular rosacea -sees opthomology    Raynaud's disease 10/21/2012   Raynaud's disease /phenomenon 10/21/2012   no meds, worse in the winter or cold environment.    Rheumatoid aortitis    uses Diclofenac gel;Rheumatoid   Seasonal allergies    Flonase  daily    Strain of left piriformis muscle 05/18/2019   Urinary incontinence    Past Surgical History:  Procedure Laterality Date   ABDOMINAL HYSTERECTOMY  1997   BREAST BIOPSY  1999   benign   COLONOSCOPY  02/11/2013 and 12/24/04   internal and external hemorrhoids   ENDOMETRIAL ABLATION     EXCISION MORTON'S NEUROMA Right    FLEXIBLE SIGMOIDOSCOPY  03/07/2008   internal and external hemorrhoids   POSTERIOR LUMBAR FUSION  05/02/2013   L4-L5 fusion (cage/screws/bone graft) Dr. Louis   PR ARTHRP INTERCARPAL/CARP/MTCRPL JT SUSPENSION  ARTHROPLASTY, INTERCARPAL OR CARPOMETACARPAL JOINTS; SUSPENSION, INCLUDING TRANSFER OR TRANSPLANT OF TENDON, WITH INTERPOSITION,  WHEN PERFORMED  04/2024   TONSILLECTOMY     UPPER GASTROINTESTINAL ENDOSCOPY  10/23/2010   gastritis, irregular Z-line   wisdom teeth extracted     Patient Active Problem List   Diagnosis Date Noted   Age-related osteoporosis without current pathological fracture 06/08/2024   Arthritis of carpometacarpal [facility]) joint of left thumb 03/07/2024   Primary osteoarthritis of both first carpometacarpal joints 06/16/2023   Primary osteoarthritis 06/16/2023   Mass of upper outer quadrant of left breast 05/29/2022   Primary hypertension 02/14/2022   Encounter for long-term current use of medication 05/27/2021   Autoimmune disease 08/06/2020   Arthritis of carpometacarpal [facility]) joint of right thumb 05/08/2020   Cervical spondylosis 12/19/2019   B12 deficiency 11/03/2019   Status post lumbar spinal fusion 08/03/2019    Spondylosis without myelopathy or radiculopathy, lumbar region 08/03/2019   DDD (degenerative disc disease), lumbar 05/18/2019   Panic disorder 01/05/2019   Hormone replacement therapy 12/29/2018   Sleep disturbance 12/29/2018   Eczema 12/29/2018   PTSD (post-traumatic stress disorder) 05/30/2018   Recurrent major depression in partial remission 11/10/2014   Hot flashes 11/10/2014   Carpal tunnel syndrome 08/24/2013   Spinal stenosis, lumbar region, 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   Raynaud's disease 10/21/2012   Heart palpitations 02/16/2012    ONSET DATE: DOS 05/09/24  REFERRING DIAG: M18.11 (ICD-10-CM) - Arthritis of carpometacarpal (CMC) joint of right thumb   THERAPY DIAG:  Pain in right hand - Plan: Ot plan of care cert/re-cert  Muscle weakness (generalized) - Plan: Ot plan of care cert/re-cert  Localized edema - Plan: Ot plan of care cert/re-cert  Other lack of coordination - Plan: Ot plan of care cert/re-cert  Stiffness of right hand, not elsewhere classified - Plan: Ot plan of care cert/re-cert  Rationale for Evaluation and Treatment: Rehabilitation  SUBJECTIVE:   SUBJECTIVE STATEMENT: She is ~5 weeks post Rt CMC J arthroplasty (mini tightrope procedure).    She states that she had a surgery at [facility], but she has not been doing any therapy yet.  She has a hand-based brace that was made for her last week but it is uncomfortable and hurting her.  Today she has a deformed looking brace that is rubbing her and causing pressure and pain which will need replaced.    PERTINENT HISTORY: History of arthritis leading to [facility] joint arthroplasty  PRECAUTIONS: None  RED FLAGS: None   WEIGHT BEARING RESTRICTIONS: Yes <5# weight bearing for next 4 weeks  PAIN:  Are you having pain? Yes: NPRS scale: 0/10 at rest now, pain at worst in past week up to 4/10 at times  Pain location: Right thumb surgical area Pain description: Sore at times and sometimes shooting  pain Aggravating factors: Movement or weightbearing Relieving factors: Rest  FALLS: Has patient fallen in last 6 months? No  PLOF: Independent  PATIENT GOALS: To safely improve the use of her right dominant hand and thumb  NEXT MD VISIT: As needed   OBJECTIVE: (All objective assessments below are from initial evaluation on: 06/14/24 unless otherwise specified.)   HAND DOMINANCE: Right   ADLs: Overall ADLs: States decreased ability to grab, hold household objects, pain and difficulty to open containers, perform FMS tasks (manipulate fasteners on clothing), mild to moderate bathing problems as well.    FUNCTIONAL OUTCOME MEASURES: Eval: Patient Specific Functional Scale: 6.6 (ride bike, roller skate, tie shoes, unscrew tops, type)  (Higher Score  =  Better Ability for the Selected Tasks)  UPPER EXTREMITY ROM     Shoulder to Wrist AROM Right eval  Forearm supination 85  Forearm pronation  80  Wrist flexion 57  Wrist extension 46  Wrist ulnar deviation   Wrist radial deviation   Functional dart thrower's motion (F-DTM) in ulnar flexion   F-DTM in radial extension    (Blank rows = not tested)   Hand AROM Right eval  Full Fist Ability (or Gap to Distal Palmar Crease) Can almost make a full fist, stiffness  Thumb Opposition  (Kapandji Scale)  5/10   Thumb MCP (0-60) 0-16  Thumb IP (0-80) 0-40  Thumb Radial Abduction Span   Thumb Palmar Abduction Span   (Blank rows = not tested)   HAND FUNCTION: Eval: Observed weakness in affected Rt hand. TBD when safe  Grip strength Right: TBD lbs, Left: TBD lbs   COORDINATION: Eval: Observed coordination impairments with affected Rt hand. Details next session at times allows  9 Hole Peg Test Right: TBDsec (TBD sec is WFL)   SENSATION: Eval:  Light touch intact today, though diminished around sx area    EDEMA:   Eval:  Only very mildly swollen in Rt hand and wrist today  COGNITION: Eval: Overall cognitive status: WFL  for evaluation today   OBSERVATIONS:   Eval: scar well healing but mildly adherent, difficulty opening transverse arch of palm, looks fairly stiff at thumb MP J    Rt thumb CMC J A   TODAY'S TREATMENT:  Post-evaluation treatment:     Custom orthotic fabrication was indicated due to pt's healing Rt thumb CMC J arthoplasty sx and need for safe, functional positioning. Her current brace is rubbing and hurting her thumb. OT fabricated custom hand-based thumb spica orthosis for pt today to protect the thumb sx site. It fit well with no areas of pressure, pt states a comfortable fit. Pt was educated on the wearing schedule (on at all times except for hygiene and exercises), to avoid exposing it to sources of heat, to wipe clean as needed (do not wash, use harsh detergents), to call or come in ASAP if it is causing any irritation or is not achieving desired function. It will be checked/adjusted in upcoming sessions, as needed. Pt states understanding all directions.     For safety/self-care, the patient was recommended to do no pushing/pulling or weightbearing through the Rt hand or arm now or for the next 4 weeks.  The patient was taught to care for the postsurgical wound by doing light touch desensitization, gentle scar mobilizations, keep moist with a Vaseline.  The patient can quickly shower and dab dry.  The patient was given a compressive stockinette to help with swelling.  The patient was also given the following home exercise program to perform in a nonpainful fashion approximately 4 times a day.  Each one was reviewed with the patient, with performance back to show understanding and no significant pain.  The patient leaves without any questions or concerns.  Exercises - Wrist Flexion Stretch  - 4 x daily - 5 reps - 15 sec hold - Wrist Prayer Stretch  - 4 x daily - 3 reps - 15 sec hold - Seated Finger Composite Flexion Stretch  - 4 x daily - 3-5 reps - 15 hold - Stretch thumb toward base of  small finger (put hand in LAP)  - 2-3 x daily - 3-5 reps - 15 sec hold - Bend and Pull Back Wrist SLOWLY  - 4 x daily - 10-15  reps - Tendon Glides  - 4-6 x daily - 3-5 reps - 2-3 seconds hold - Thumb AROM MP Blocking  - 2-3 x daily - 10 reps - Thumb Opposition  - 4-6 x daily - 10 reps  Patient Education - Scar Massage    PATIENT EDUCATION: Education details: See tx section above for details  Person educated: Patient Education method: Verbal Instruction, Teach back, Handouts  Education comprehension: States and demonstrates understanding, Additional Education required    HOME EXERCISE PROGRAM: Access Code: [email or web address] URL: [email or web address] Date: 06/14/2024 Prepared by: Melvenia Ada   GOALS: Goals reviewed with patient? Yes   SHORT TERM GOALS: (STG required if POC>30 days) Target Date: 06/24/24  Pt will obtain protective, custom orthotic. Goal status: 06/14/24:  MET   2.  Pt will demo/state understanding of initial HEP to improve pain levels and prerequisite motion. Goal status: INITIAL   LONG TERM GOALS: Target Date: 07/22/24  Pt will improve functional ability by decreased impairment per PSFS assessment from 6.6 to 8 or better, for better quality of life. Goal status: INITIAL  2.  Pt will improve grip strength in Rt hand to at least 25lbs for functional use at home and in IADLs. Goal status: INITIAL  3.  Pt will improve A/ROM in Rt thumb MP J flexion from 16* to at least 30*, to have functional motion for tasks like reach and grasp.  Goal status: INITIAL  4.  Pt will improve coordination skills in Rt hand, as seen by within functional limit score on 9HPT testing to have increased functional ability to carry out fine motor tasks (fasteners, etc.) and more complex, coordinated IADLs (meal prep, sports, etc.).  Goal status: INITIAL  5.  Pt will decrease pain at worst from 4/10 to 1/10 or better to have better sleep and occupational participation in  daily roles. Goal status: INITIAL   ASSESSMENT:  CLINICAL IMPRESSION: Patient is a 68 y.o. female who was seen today for occupational therapy evaluation for post-op swelling, stiffness, weakness, pain, and decreased fnl aility in right hand after [facility] J arthroplasty surgery ~5 weeks ago.  The patient will benefit from outpatient occupational therapy to decrease symptoms, improve functional upper extremity use, and increase quality of life.  PERFORMANCE DEFICITS: in functional skills including ADLs, IADLs, coordination, dexterity, sensation, edema, ROM, strength, pain, fascial restrictions, flexibility, Fine motor control, body mechanics, endurance, decreased knowledge of precautions, wound, and UE functional use, cognitive skills including problem solving and safety awareness, and psychosocial skills including coping strategies, environmental adaptation, habits, and routines and behaviors.   IMPAIRMENTS: are limiting patient from ADLs, IADLs, rest and sleep, and leisure.   COMORBIDITIES: may have co-morbidities  that affects occupational performance. Patient will benefit from skilled OT to address above impairments and improve overall function.  MODIFICATION OR ASSISTANCE TO COMPLETE EVALUATION: No modification of tasks or assist necessary to complete an evaluation.  OT OCCUPATIONAL PROFILE AND HISTORY: Problem focused assessment: Including review of records relating to presenting problem.  CLINICAL DECISION MAKING: Moderate - several treatment options, min-mod task modification necessary  REHAB POTENTIAL: Excellent  EVALUATION COMPLEXITY: Low      PLAN:  OT FREQUENCY: 1x/week  OT DURATION: 5 weeks through 07/23/23 and up to 6 total visits as needed   PLANNED INTERVENTIONS: 97535 self care/ADL training, 02889 therapeutic exercise, 97530 therapeutic activity, 97112 neuromuscular re-education, 97140 manual therapy, 97035 ultrasound, [id number] electrical stimulation (manual), [id number] Orthotic  Initial, [id number] Orthotic/Prosthetic subsequent, compression bandaging, Dry needling, energy conservation, coping strategies training,  and patient/family education  RECOMMENDED OTHER SERVICES: none now    CONSULTED AND AGREED WITH PLAN OF CARE: Patient  PLAN FOR NEXT SESSION:   Review initial HEP and recommendations, check orthosis and upgrade to 6 weeks postop  Melvenia Ada, OTR/L, CHT 06/14/2024, 5:31 PM

## 2024-06-13 NOTE — Progress Notes (Signed)
 [facility] Counselor/Therapist Progress Note  Patient ID: [name], MRN: [id number],    Date: 06/13/2024  Time Spent: 53 minutes   Treatment Type: Individual Therapy  Reported Symptoms: anxiety, depression, angry, frustration   Mental Status Exam:  Appearance:   Casual and Neat     Behavior:  Appropriate, Sharing, and Motivated  Motor:  Normal  Speech/Language:   Normal Rate  Affect:  Some anxiety, some depression  Mood:  anxious and depressed  Thought process:  goal directed  Thought content:    Rumination  Sensory/Perceptual disturbances:    WNL  Orientation:  oriented to person, place, time/date, situation, day of week, month of year, year, and stated date of Dec. 1, 2025  Attention:  Good  Concentration:  Good  Memory:  WNL  Fund of knowledge:   Good  Insight:    Good  Judgment:   Good  Impulse Control:  Good   Risk Assessment: Danger to Self:  No Self-injurious Behavior: No Danger to Others: No Duty to Warn:no Physical Aggression / Violence:No  Access to Firearms a concern: No  Gang Involvement:No   Subjective:  Patient today angry and still struggling with communication with her 2 insurance companied following her hand surgery recently. Feels that her pain is lessening gradually, frustration continues with insurance and in her healing process after surgery although states it is healing. Working in session today on letting go of things related to mother, husband, and surgery. Frustrations with husband and mother, not going to beg them for attention or affection. Talking through her hurt, frustration, anger, loneliness, and not going to beg anymore for attention. Discussed some ways she might handle this situation more effectively for her and husband, and paying more attention to how she says what she says. I get tired of talking and begging in our relationship. Processing her frustrations, hurts, and feeling ignored in their marriage. Talked about  the possibility of she and husband seeing a marriage counselor and she plans to ask husband. Worked really well in session today, less avoidant, able to see her own part in issues with husband. Feeling trapped enough to where she is really wanting husband to agree to go with her to marital counseling. Walking helps some mentally, and music on her headphones is helpful also.   Interventions: Cognitive Behavioral Therapy, Solution-Oriented/Positive Psychology, and Ego-Supportive Long term goal: Develop healthy cognitive patterns and beliefs about self and the world that lead to alleviation of depression and anxiety and help prevent relapse.  Progressing: Patient is motivated. Struggling to focus some due to immediate, unexpected health concern with husband.  Short term goal: Learn and implement personal skills for managing stress, solving daily problems, and resolving conflicts effectively. Strategy: Teach patient calming skills, problem solving skills, and conflict resolution skills, to better manage daily stressors   Diagnosis:   ICD-10-CM   1. Generalized anxiety disorder  F41.1      Plan:  Patient more calm today and focusing more on her marriage and problematic relationship with husband. Aware of a lot of the ways she and husband are similar but also different. Did better today in talking through her hurts, frustrations, and anger, ending in her feeling better and understood before session end. A bit more hopeful and some of the times previously. Did remind patient and encouraged her to be practicing more positive/self affirming behaviors as noted in session including: Take breaks as needed throughout the day to experience more calmness, use of deep  breathing exercises, taking her medications as prescribed, exercising on her bike to help deal with stress, setting limits to better manage her stress, making healthy sleep patterns a priority, stay in touch with supportive people, working on  letting go of her negativity and over worrying, healthy boundaries with others, and recognize the strength she shows when working with goal-directed behaviors to help move in a direction that supports her overall improved emotional health, outlook, and continue supporting a sense of hopefulness into the future.  Goal review and progress/challenges noted with patient.  Next appointment within 3 weeks.   Barnie Bunde, LCSW

## 2024-06-14 ENCOUNTER — Encounter: Payer: Self-pay | Admitting: Rehabilitative and Restorative Service Providers"

## 2024-06-14 ENCOUNTER — Ambulatory Visit: Admitting: Rehabilitative and Restorative Service Providers"

## 2024-06-14 DIAGNOSIS — R278 Other lack of coordination: Secondary | ICD-10-CM | POA: Diagnosis not present

## 2024-06-14 DIAGNOSIS — R600 Localized edema: Secondary | ICD-10-CM | POA: Diagnosis not present

## 2024-06-14 DIAGNOSIS — M6281 Muscle weakness (generalized): Secondary | ICD-10-CM | POA: Diagnosis not present

## 2024-06-14 DIAGNOSIS — M79641 Pain in right hand: Secondary | ICD-10-CM

## 2024-06-14 DIAGNOSIS — M25641 Stiffness of right hand, not elsewhere classified: Secondary | ICD-10-CM

## 2024-06-16 NOTE — Therapy (Signed)
 OUTPATIENT OCCUPATIONAL THERAPY TREATMENT NOTE  Patient Name: [name] MRN: [id number] DOB:[birth year], 68 y.o., female Today's Date: 06/20/2024  PCP: Catherine HAMS DO REFERRING PROVIDER: Claudene Arthea HERO, DO   END OF SESSION:  OT End of Session - 06/20/24 0809     Visit Number 2    Number of Visits 6    Date for Recertification  07/22/24    Authorization Type Medicare    OT Start Time 0805    OT Stop Time 0852    OT Time Calculation (min) 47 min    Equipment Utilized During Treatment --    Activity Tolerance Patient tolerated treatment well;No increased pain;Patient limited by fatigue;Patient limited by pain    Behavior During Therapy [facility] for tasks assessed/performed           Past Medical History:  Diagnosis Date   Allergy    Anemia    after birth of child 18yrs ago   Anxiety and depression    with insomnia   Autoimmune disease 08/06/2020   Blood transfusion without reported diagnosis    x3   Chronic back pain    ? RA, ? Lupus - reports saw rheumatologist in the past but better now, sees Dr. Malcolm   Eczema    Flushing 05/27/2021   GERD 02/13/2007   Qualifier: Diagnosis of  By: Norleen MD, Lynwood ORN    GERD (gastroesophageal reflux disease)    takes Omeprazole  daily, with nausea   Greater trochanteric bursitis of left hip 03/25/2019   Injected March 25, 2019   Heart murmur    Hemorrhoids    History of kidney stones    passed on her own   History of migraine    last one a yr ago and takes Zomig  prn   Hot flashes 11/10/2014   HTN (hypertension) 03/31/2012   Hx of echocardiogram    Echo (8/15):  EF 55-60%, no RWMA   IBS (irritable bowel syndrome)    constipation predominant   Interstitial cystitis    hx of UTI   Irritable bowel syndrome 12/14/2009   Qualifier: Diagnosis of  By: Avram MD, NOLIA Lupita BRAVO    Memory loss 12/22/2016   Numbness    both legs and related to back   Osteoporosis    osteopenia   Palpitations    on metoprolol  in past but  made BP too low   Pneumonia    hx of;last time 27yrs ago   PONV (postoperative nausea and vomiting)    laryngospasm-1999   Poor vision 10/15/2015   -? Ocular rosacea -sees opthomology    Raynaud's disease 10/21/2012   Raynaud's disease /phenomenon 10/21/2012   no meds, worse in the winter or cold environment.    Rheumatoid aortitis    uses Diclofenac gel;Rheumatoid   Seasonal allergies    Flonase  daily    Strain of left piriformis muscle 05/18/2019   Urinary incontinence    Past Surgical History:  Procedure Laterality Date   ABDOMINAL HYSTERECTOMY  1997   BREAST BIOPSY  1999   benign   COLONOSCOPY  02/11/2013 and 12/24/04   internal and external hemorrhoids   ENDOMETRIAL ABLATION     EXCISION MORTON'S NEUROMA Right    FLEXIBLE SIGMOIDOSCOPY  03/07/2008   internal and external hemorrhoids   POSTERIOR LUMBAR FUSION  05/02/2013   L4-L5 fusion (cage/screws/bone graft) Dr. Louis   PR ARTHRP INTERCARPAL/CARP/MTCRPL JT SUSPENSION  ARTHROPLASTY, INTERCARPAL OR CARPOMETACARPAL JOINTS; SUSPENSION, INCLUDING TRANSFER OR TRANSPLANT OF TENDON, WITH INTERPOSITION,  WHEN PERFORMED  04/2024   TONSILLECTOMY     UPPER GASTROINTESTINAL ENDOSCOPY  10/23/2010   gastritis, irregular Z-line   wisdom teeth extracted     Patient Active Problem List   Diagnosis Date Noted   Age-related osteoporosis without current pathological fracture 06/08/2024   Arthritis of carpometacarpal [facility]) joint of left thumb 03/07/2024   Primary osteoarthritis of both first carpometacarpal joints 06/16/2023   Primary osteoarthritis 06/16/2023   Mass of upper outer quadrant of left breast 05/29/2022   Primary hypertension 02/14/2022   Encounter for long-term current use of medication 05/27/2021   Autoimmune disease 08/06/2020   Arthritis of carpometacarpal [facility]) joint of right thumb 05/08/2020   Cervical spondylosis 12/19/2019   B12 deficiency 11/03/2019   Status post lumbar spinal fusion 08/03/2019   Spondylosis without  myelopathy or radiculopathy, lumbar region 08/03/2019   DDD (degenerative disc disease), lumbar 05/18/2019   Panic disorder 01/05/2019   Hormone replacement therapy 12/29/2018   Sleep disturbance 12/29/2018   Eczema 12/29/2018   PTSD (post-traumatic stress disorder) 05/30/2018   Recurrent major depression in partial remission 11/10/2014   Hot flashes 11/10/2014   Carpal tunnel syndrome 08/24/2013   Spinal stenosis, lumbar region, 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   Raynaud's disease 10/21/2012   Heart palpitations 02/16/2012    ONSET DATE: DOS 05/09/24  REFERRING DIAG: M18.11 (ICD-10-CM) - Arthritis of carpometacarpal (CMC) joint of right thumb   THERAPY DIAG:  Muscle weakness (generalized)  Pain in right hand  Other lack of coordination  Localized edema  Stiffness of right hand, not elsewhere classified  Rationale for Evaluation and Treatment: Rehabilitation  PERTINENT HISTORY: History of arthritis leading to [facility] joint arthroplasty She states that she had a surgery at [facility], but she has not been doing any therapy yet.  She has a hand-based brace that was made for her last week but it is uncomfortable and hurting her.  Today she has a deformed looking brace that is rubbing her and causing pressure and pain which will need replaced.    PRECAUTIONS: None  RED FLAGS: None   WEIGHT BEARING RESTRICTIONS: Yes <5# weight bearing for next 4 weeks   SUBJECTIVE:   SUBJECTIVE STATEMENT: She is ~6 weeks post Rt CMC J arthroplasty (mini tightrope procedure).    Se states she is having some and is having some irritation with her brace, but overall she likes it much better than the last brace that she had.  She is not wearing the underwent wearing the under sleeve today because wearing the under sleeve today because it got dirty.  She states she has been doing her HEP   PAIN:  Are you having pain? Yes: NPRS scale:  0/10 at rest now, pain at worst in past week up to  4/10 at times  Pain location: Right thumb surgical area Pain description: Sore at times and sometimes shooting pain Aggravating factors: Movement or weightbearing Relieving factors: Rest   PATIENT GOALS: To safely improve the use of her right dominant hand and thumb  NEXT MD VISIT: As needed   OBJECTIVE: (All objective assessments below are from initial evaluation on: 06/14/24 unless otherwise specified.)   HAND DOMINANCE: Right   ADLs: Overall ADLs: States decreased ability to grab, hold household objects, pain and difficulty to open containers, perform FMS tasks (manipulate fasteners on clothing), mild to moderate bathing problems as well.    FUNCTIONAL OUTCOME MEASURES: Eval: Patient Specific Functional Scale: 6.6 (ride bike, roller skate, tie shoes, unscrew tops, type)  (  Higher Score  =  Better Ability for the Selected Tasks)      UPPER EXTREMITY ROM     Shoulder to Wrist AROM Right eval Rt 06/20/24  Forearm supination 85   Forearm pronation  80   Wrist flexion 57 55  Wrist extension 46 40  Wrist ulnar deviation    Wrist radial deviation    Functional dart thrower's motion (F-DTM) in ulnar flexion    F-DTM in radial extension     (Blank rows = not tested)   Hand AROM Right eval Rt 06/20/24  Full Fist Ability (or Gap to Distal Palmar Crease) Can almost make a full fist, stiffness   Thumb Opposition  (Kapandji Scale)  5/10  6/10  Thumb MCP (0-60) 0-16 0 - 27  Thumb IP (0-80) 0-40 0 - 54  Thumb Radial Abduction Span    Thumb Palmar Abduction Span    (Blank rows = not tested)   HAND FUNCTION: Eval: Observed weakness in affected Rt hand. TBD when safe  Grip strength Right: TBD lbs, Left: TBD lbs   COORDINATION: 06/20/24: 9 Hole Peg Test Right: TBDsec (TBD sec is WFL)   SENSATION: Eval:  Light touch intact today, though diminished around sx area    EDEMA:   Eval:  Only very mildly swollen in Rt hand and wrist today  COGNITION: Eval: Overall cognitive  status: WFL for evaluation today   OBSERVATIONS:   Eval: scar well healing but mildly adherent, difficulty opening transverse arch of palm, looks fairly stiff at thumb MP J    Rt thumb CMC J A   TODAY'S TREATMENT:  06/20/24: She starts with active range of motion for exercise as well as new measures which shows improvements at the improvements at the thumb and the wrist now improvements at the thumb and the wrist now but wrist extension is still stiff and she admits it is stiff and she admits to not being able to do those stiff and she admits to not being able to do prayer stretches.  To help with that to help with that, OT to help with that, OT changes to wrist extension stretch, and also educates of educates on modification of educates on modifications to thumb stretches.  This is this includes ice this includes isolated joint stretches as well as trigger technique. As she is 6 weeks post-op, OT also edu on gentle isometric resistance to begin at the thumb in composite flexion and thumb palmar abduction to build stability.  She has no pain with these, but was edu to not be tempted to push so hard that it hurts.   OT also provides provides nonslip Dycem to help with scar mobilizations.  Also does manual therapy K-taping to help with scar adhesion and tension on the skin.  She was told to remove this after 2 days or sooner, if it causes any skin irritation.v She was also edu on weaning from brace to do in-hand manipulation skills as listed below. They should bot hurt her.   She leaves stating understanding, having no pain.    Exercises - Wrist Flexion Stretch  - 4 x daily - 5 reps - 15 sec hold - Seated Wrist Extension Stretch  - 4 x daily - 5 reps - 15 hold - Seated Finger Composite Flexion Stretch  - 4 x daily - 3-5 reps - 15 hold - Stretch thumb toward base of small finger (put hand in LAP)  - 2-3 x daily - 3-5 reps - 15  sec hold - Bend and Pull Back Wrist SLOWLY  - 4 x daily - 10-15 reps -  Tendon Glides  - 4-6 x daily - 3-5 reps - 2-3 seconds hold - Thumb AROM MP Blocking  - 2-3 x daily - 10 reps - Thumb Opposition  - 4-6 x daily - 10 reps - Thumb isometric strength (resist with other hand)   - 2-3 x daily - 5 reps - 5 sec hold   In-Hand Manipulation Skills Rotation:  Hold pen, try to "twirl" like a baton, keeping parallel (or flat) with surface of table. Try going BOTH directions 10x  Flip:  Hold pen in writing position,  flip in an arch to "erase" position, then back to "write" position. Do not lift hand off table.  10x  Translation:  Open hand palm up,  put an object in your palm and then use your fingers and thumb to move it to the tips of your fingers, pinched against your thumb. (bigger is easier (fat marker), smaller is harder (penny)) 10x  Shift:  Hold pen like a dart, start "shifting" it forward & backwards from tip to base (like putting a key in a key hole) 10x     PATIENT EDUCATION: Education details: See tx section above for details  Person educated: Patient Education method: Verbal Instruction, Teach back, Handouts  Education comprehension: States and demonstrates understanding, Additional Education required    HOME EXERCISE PROGRAM: Access Code: [email or web address] URL: [email or web address] Date: 06/14/2024 Prepared by: Melvenia Ada   GOALS: Goals reviewed with patient? Yes   SHORT TERM GOALS: (STG required if POC>30 days) Target Date: 06/24/24  Pt will obtain protective, custom orthotic. Goal status: 06/14/24:  MET   2.  Pt will demo/state understanding of initial HEP to improve pain levels and prerequisite motion. Goal status: INITIAL   LONG TERM GOALS: Target Date: 07/22/24  Pt will improve functional ability by decreased impairment per PSFS assessment from 6.6 to 8 or better, for better quality of life. Goal status: INITIAL  2.  Pt will improve grip strength in Rt hand to at least 25lbs for functional use at home and in  IADLs. Goal status: INITIAL  3.  Pt will improve A/ROM in Rt thumb MP J flexion from 16* to at least 30*, to have functional motion for tasks like reach and grasp.  Goal status: INITIAL  4.  Pt will improve coordination skills in Rt hand, as seen by within functional limit score on 9HPT testing to have increased functional ability to carry out fine motor tasks (fasteners, etc.) and more complex, coordinated IADLs (meal prep, sports, etc.).  Goal status: INITIAL  5.  Pt will decrease pain at worst from 4/10 to 1/10 or better to have better sleep and occupational participation in daily roles. Goal status: INITIAL   ASSESSMENT:  CLINICAL IMPRESSION: 06/20/24: Tolerating isometric resistance well, thumb motion is improving slowly .   Eval: Patient is a 68 y.o. female who was seen today for occupational therapy evaluation for post-op swelling, stiffness, weakness, pain, and decreased fnl aility in right hand after [facility] J arthroplasty surgery ~5 weeks ago.  The patient will benefit from outpatient occupational therapy to decrease symptoms, improve functional upper extremity use, and increase quality of life.     PLAN:  OT FREQUENCY: 1x/week  OT DURATION: 5 weeks through 07/23/23 and up to 6 total visits as needed   PLANNED INTERVENTIONS: 97535 self care/ADL training, 02889 therapeutic exercise, 97530 therapeutic activity, 97112 neuromuscular re-education, [id number]  manual therapy, 97035 ultrasound, 02967 electrical stimulation (manual), [id number] Orthotic Initial, [id number] Orthotic/Prosthetic subsequent, compression bandaging, Dry needling, energy conservation, coping strategies training, and patient/family education  RECOMMENDED OTHER SERVICES: none now    CONSULTED AND AGREED WITH PLAN OF CARE: Patient  PLAN FOR NEXT SESSION:   Check grip, upgrade to 7 weeks, check coord   Aidan Caloca Georgina, OTR/L, CHT 06/20/2024, 9:26 AM

## 2024-06-17 ENCOUNTER — Other Ambulatory Visit (HOSPITAL_COMMUNITY): Payer: Self-pay

## 2024-06-17 ENCOUNTER — Other Ambulatory Visit: Payer: Self-pay

## 2024-06-18 ENCOUNTER — Other Ambulatory Visit (HOSPITAL_COMMUNITY): Payer: Self-pay

## 2024-06-20 ENCOUNTER — Ambulatory Visit: Admitting: Rehabilitative and Restorative Service Providers"

## 2024-06-20 ENCOUNTER — Encounter: Payer: Self-pay | Admitting: Rehabilitative and Restorative Service Providers"

## 2024-06-20 DIAGNOSIS — M25641 Stiffness of right hand, not elsewhere classified: Secondary | ICD-10-CM

## 2024-06-20 DIAGNOSIS — R278 Other lack of coordination: Secondary | ICD-10-CM

## 2024-06-20 DIAGNOSIS — R600 Localized edema: Secondary | ICD-10-CM

## 2024-06-20 DIAGNOSIS — M79641 Pain in right hand: Secondary | ICD-10-CM

## 2024-06-20 DIAGNOSIS — M6281 Muscle weakness (generalized): Secondary | ICD-10-CM

## 2024-06-30 NOTE — Therapy (Signed)
 OUTPATIENT OCCUPATIONAL THERAPY TREATMENT NOTE  Patient Name: [name] MRN: [id number] DOB:07/23/1955, 68 y.o., female Today's Date: 06/30/2024  PCP: Catherine HAMS DO REFERRING PROVIDER: Claudene Arthea HERO, DO   END OF SESSION:     Past Medical History:  Diagnosis Date   Allergy    Anemia    after birth of child 73yrs ago   Anxiety and depression    with insomnia   Autoimmune disease 08/06/2020   Blood transfusion without reported diagnosis    x3   Chronic back pain    ? RA, ? Lupus - reports saw rheumatologist in the past but better now, sees Dr. Malcolm   Eczema    Flushing 05/27/2021   GERD 02/13/2007   Qualifier: Diagnosis of  By: Norleen MD, Lynwood ORN    GERD (gastroesophageal reflux disease)    takes Omeprazole  daily, with nausea   Greater trochanteric bursitis of left hip 03/25/2019   Injected March 25, 2019   Heart murmur    Hemorrhoids    History of kidney stones    passed on her own   History of migraine    last one a yr ago and takes Zomig  prn   Hot flashes 11/10/2014   HTN (hypertension) 03/31/2012   Hx of echocardiogram    Echo (8/15):  EF 55-60%, no RWMA   IBS (irritable bowel syndrome)    constipation predominant   Interstitial cystitis    hx of UTI   Irritable bowel syndrome 12/14/2009   Qualifier: Diagnosis of  By: Avram MD, NOLIA Lupita BRAVO    Memory loss 12/22/2016   Numbness    both legs and related to back   Osteoporosis    osteopenia   Palpitations    on metoprolol  in past but made BP too low   Pneumonia    hx of;last time 65yrs ago   PONV (postoperative nausea and vomiting)    laryngospasm-1999   Poor vision 10/15/2015   -? Ocular rosacea -sees opthomology    Raynaud's disease 10/21/2012   Raynaud's disease /phenomenon 10/21/2012   no meds, worse in the winter or cold environment.    Rheumatoid aortitis    uses Diclofenac gel;Rheumatoid   Seasonal allergies    Flonase  daily    Strain of left piriformis muscle 05/18/2019    Urinary incontinence    Past Surgical History:  Procedure Laterality Date   ABDOMINAL HYSTERECTOMY  1997   BREAST BIOPSY  1999   benign   COLONOSCOPY  02/11/2013 and 12/24/04   internal and external hemorrhoids   ENDOMETRIAL ABLATION     EXCISION MORTON'S NEUROMA Right    FLEXIBLE SIGMOIDOSCOPY  03/07/2008   internal and external hemorrhoids   POSTERIOR LUMBAR FUSION  05/02/2013   L4-L5 fusion (cage/screws/bone graft) Dr. Louis   PR ARTHRP INTERCARPAL/CARP/MTCRPL JT SUSPENSION  ARTHROPLASTY, INTERCARPAL OR CARPOMETACARPAL JOINTS; SUSPENSION, INCLUDING TRANSFER OR TRANSPLANT OF TENDON, WITH INTERPOSITION, WHEN PERFORMED  04/2024   TONSILLECTOMY     UPPER GASTROINTESTINAL ENDOSCOPY  10/23/2010   gastritis, irregular Z-line   wisdom teeth extracted     Patient Active Problem List   Diagnosis Date Noted   Age-related osteoporosis without current pathological fracture 06/08/2024   Arthritis of carpometacarpal [facility]) joint of left thumb 03/07/2024   Primary osteoarthritis of both first carpometacarpal joints 06/16/2023   Primary osteoarthritis 06/16/2023   Mass of upper outer quadrant of left breast 05/29/2022   Primary hypertension 02/14/2022   Encounter for long-term current use of medication 05/27/2021  Autoimmune disease 08/06/2020   Arthritis of carpometacarpal [facility]) joint of right thumb 05/08/2020   Cervical spondylosis 12/19/2019   B12 deficiency 11/03/2019   Status post lumbar spinal fusion 08/03/2019   Spondylosis without myelopathy or radiculopathy, lumbar region 08/03/2019   DDD (degenerative disc disease), lumbar 05/18/2019   Panic disorder 01/05/2019   Hormone replacement therapy 12/29/2018   Sleep disturbance 12/29/2018   Eczema 12/29/2018   PTSD (post-traumatic stress disorder) 05/30/2018   Recurrent major depression in partial remission 11/10/2014   Hot flashes 11/10/2014   Carpal tunnel syndrome 08/24/2013   Spinal stenosis, lumbar region, with neurogenic  claudication 05/02/2013   Raynaud's disease 10/21/2012   Heart palpitations 02/16/2012    ONSET DATE: DOS 05/09/24  REFERRING DIAG: M18.11 (ICD-10-CM) - Arthritis of carpometacarpal (CMC) joint of right thumb   THERAPY DIAG:  No diagnosis found.  Rationale for Evaluation and Treatment: Rehabilitation  PERTINENT HISTORY: History of arthritis leading to [facility] joint arthroplasty She states that she had a surgery at [facility], but she has not been doing any therapy yet.  She has a hand-based brace that was made for her last week but it is uncomfortable and hurting her.  Today she has a deformed looking brace that is rubbing her and causing pressure and pain which will need replaced.    PRECAUTIONS: None  RED FLAGS: None   WEIGHT BEARING RESTRICTIONS: Yes <5# weight bearing for next 4 weeks   SUBJECTIVE:   SUBJECTIVE STATEMENT: She is ~7 weeks post Rt CMC J arthroplasty (mini tightrope procedure).    She states  ***     PAIN:  Are you having pain? Yes: NPRS scale:  *** 0/10 at rest now, pain at worst in past week up to 4/10 at times  Pain location: Right thumb surgical area Pain description: Sore at times and sometimes shooting pain Aggravating factors: Movement or weightbearing Relieving factors: Rest   PATIENT GOALS: To safely improve the use of her right dominant hand and thumb  NEXT MD VISIT: As needed   OBJECTIVE: (All objective assessments below are from initial evaluation on: 06/14/24 unless otherwise specified.)   HAND DOMINANCE: Right   ADLs: Overall ADLs: States decreased ability to grab, hold household objects, pain and difficulty to open containers, perform FMS tasks (manipulate fasteners on clothing), mild to moderate bathing problems as well.    FUNCTIONAL OUTCOME MEASURES: Eval: Patient Specific Functional Scale: 6.6 (ride bike, roller skate, tie shoes, unscrew tops, type)  (Higher Score  =  Better Ability for the Selected Tasks)      UPPER  EXTREMITY ROM     Shoulder to Wrist AROM Right eval Rt 06/20/24 Rt 07/01/24  Forearm supination 85    Forearm pronation  80    Wrist flexion 57 55 ***  Wrist extension 46 40 ***  Wrist ulnar deviation     Wrist radial deviation     Functional dart thrower's motion (F-DTM) in ulnar flexion     F-DTM in radial extension      (Blank rows = not tested)   Hand AROM Right eval Rt 06/20/24 Rt 07/01/24  Full Fist Ability (or Gap to Distal Palmar Crease) Can almost make a full fist, stiffness  ***  Thumb Opposition  (Kapandji Scale)  5/10  6/10 ***/10  Thumb MCP (0-60) 0-16 0 - 27 0 -***  Thumb IP (0-80) 0-40 0 - 54 0 - ***  Thumb Radial Abduction Span     Thumb Palmar Abduction Span     (  Blank rows = not tested)   HAND FUNCTION: Eval: Observed weakness in affected Rt hand. TBD when safe  Grip strength Right: TBD lbs, Left: TBD lbs   COORDINATION: 07/01/24: 9 Hole Peg Test Right: ***sec (*** sec is WFL)   SENSATION: Eval:  Light touch intact today, though diminished around sx area    EDEMA:   Eval:  Only very mildly swollen in Rt hand and wrist today  OBSERVATIONS:   Eval: scar well healing but mildly adherent, difficulty opening transverse arch of palm, looks fairly stiff at thumb MP J    Rt thumb CMC J A   TODAY'S TREATMENT:  07/01/24: *** Check grip, upgrade to 7 weeks, check coord     06/20/24: She starts with active range of motion for exercise as well as new measures which shows improvements at the improvements at the thumb and the wrist now improvements at the thumb and the wrist now but wrist extension is still stiff and she admits it is stiff and she admits to not being able to do those stiff and she admits to not being able to do prayer stretches.  To help with that to help with that, OT to help with that, OT changes to wrist extension stretch, and also educates of educates on modification of educates on modifications to thumb stretches.  This is this includes  ice this includes isolated joint stretches as well as trigger technique. As she is 6 weeks post-op, OT also edu on gentle isometric resistance to begin at the thumb in composite flexion and thumb palmar abduction to build stability.  She has no pain with these, but was edu to not be tempted to push so hard that it hurts.   OT also provides provides nonslip Dycem to help with scar mobilizations.  Also does manual therapy K-taping to help with scar adhesion and tension on the skin.  She was told to remove this after 2 days or sooner, if it causes any skin irritation.v She was also edu on weaning from brace to do in-hand manipulation skills as listed below. They should bot hurt her.   She leaves stating understanding, having no pain.    Exercises - Wrist Flexion Stretch  - 4 x daily - 5 reps - 15 sec hold - Seated Wrist Extension Stretch  - 4 x daily - 5 reps - 15 hold - Seated Finger Composite Flexion Stretch  - 4 x daily - 3-5 reps - 15 hold - Stretch thumb toward base of small finger (put hand in LAP)  - 2-3 x daily - 3-5 reps - 15 sec hold - Bend and Pull Back Wrist SLOWLY  - 4 x daily - 10-15 reps - Tendon Glides  - 4-6 x daily - 3-5 reps - 2-3 seconds hold - Thumb AROM MP Blocking  - 2-3 x daily - 10 reps - Thumb Opposition  - 4-6 x daily - 10 reps - Thumb isometric strength (resist with other hand)   - 2-3 x daily - 5 reps - 5 sec hold   In-Hand Manipulation Skills Rotation:  Hold pen, try to twirl like a baton, keeping parallel (or flat) with surface of table. Try going BOTH directions 10x  Flip:  Hold pen in writing position,  flip in an arch to erase position, then back to write position. Do not lift hand off table.  10x  Translation:  Open hand palm up,  put an object in your palm and then use your fingers  and thumb to move it to the tips of your fingers, pinched against your thumb. (bigger is easier (fat marker), smaller is harder (penny)) 10x  Shift:  Hold pen like a dart,  start shifting it forward & backwards from tip to base (like putting a key in a key hole) 10x     PATIENT EDUCATION: Education details: See tx section above for details  Person educated: Patient Education method: Verbal Instruction, Teach back, Handouts  Education comprehension: States and demonstrates understanding, Additional Education required    HOME EXERCISE PROGRAM: Access Code: [email or web address] URL: [email or web address] Date: 06/14/2024 Prepared by: Melvenia Ada   GOALS: Goals reviewed with patient? Yes   SHORT TERM GOALS: (STG required if POC>30 days) Target Date: 06/24/24  Pt will obtain protective, custom orthotic. Goal status: 06/14/24:  MET   2.  Pt will demo/state understanding of initial HEP to improve pain levels and prerequisite motion. Goal status: INITIAL   LONG TERM GOALS: Target Date: 07/22/24  Pt will improve functional ability by decreased impairment per PSFS assessment from 6.6 to 8 or better, for better quality of life. Goal status: INITIAL  2.  Pt will improve grip strength in Rt hand to at least 25lbs for functional use at home and in IADLs. Goal status: INITIAL  3.  Pt will improve A/ROM in Rt thumb MP J flexion from 16* to at least 30*, to have functional motion for tasks like reach and grasp.  Goal status: INITIAL  4.  Pt will improve coordination skills in Rt hand, as seen by within functional limit score on 9HPT testing to have increased functional ability to carry out fine motor tasks (fasteners, etc.) and more complex, coordinated IADLs (meal prep, sports, etc.).  Goal status: INITIAL  5.  Pt will decrease pain at worst from 4/10 to 1/10 or better to have better sleep and occupational participation in daily roles. Goal status: INITIAL   ASSESSMENT:  CLINICAL IMPRESSION: 07/01/24: ***   06/20/24: Tolerating isometric resistance well, thumb motion is improving slowly .   PLAN:  OT FREQUENCY: 1x/week  OT DURATION: 5  weeks through 07/23/23 and up to 6 total visits as needed   PLANNED INTERVENTIONS: 97535 self care/ADL training, [id number] therapeutic exercise, 97530 therapeutic activity, 97112 neuromuscular re-education, 97140 manual therapy, 97035 ultrasound, 97032 electrical stimulation (manual), 97760 Orthotic Initial, [id number] Orthotic/Prosthetic subsequent, compression bandaging, Dry needling, energy conservation, coping strategies training, and patient/family education  CONSULTED AND AGREED WITH PLAN OF CARE: Patient  PLAN FOR NEXT SESSION:   ***  Melvenia Ada, OTR/L, CHT 06/30/2024, 4:13 PM

## 2024-07-01 ENCOUNTER — Encounter: Payer: Self-pay | Admitting: Rehabilitative and Restorative Service Providers"

## 2024-07-01 ENCOUNTER — Encounter: Admitting: Rehabilitative and Restorative Service Providers"

## 2024-07-01 DIAGNOSIS — M6281 Muscle weakness (generalized): Secondary | ICD-10-CM

## 2024-07-01 DIAGNOSIS — M79641 Pain in right hand: Secondary | ICD-10-CM | POA: Diagnosis not present

## 2024-07-01 DIAGNOSIS — M25641 Stiffness of right hand, not elsewhere classified: Secondary | ICD-10-CM

## 2024-07-01 DIAGNOSIS — R600 Localized edema: Secondary | ICD-10-CM

## 2024-07-01 DIAGNOSIS — R278 Other lack of coordination: Secondary | ICD-10-CM | POA: Diagnosis not present

## 2024-07-05 ENCOUNTER — Encounter: Admitting: Rehabilitative and Restorative Service Providers"

## 2024-07-05 ENCOUNTER — Ambulatory Visit: Admitting: Psychiatry

## 2024-07-05 NOTE — Therapy (Signed)
 " OUTPATIENT OCCUPATIONAL THERAPY TREATMENT NOTE  Patient Name: [name] MRN: [id number] DOB:[birth year], 68 y.o., female Today's Date: 07/12/2024  PCP: Catherine HAMS DO REFERRING PROVIDER: Claudene Arthea HERO, DO   END OF SESSION:  OT End of Session - 07/12/24 1102     Visit Number 4    Number of Visits 6    Date for Recertification  07/22/24    Authorization Type Medicare    OT Start Time 1102    OT Stop Time 1151    OT Time Calculation (min) 49 min    Activity Tolerance Patient tolerated treatment well;No increased pain;Patient limited by fatigue;Patient limited by pain    Behavior During Therapy [facility] for tasks assessed/performed             Past Medical History:  Diagnosis Date   Allergy    Anemia    after birth of child 62yrs ago   Anxiety and depression    with insomnia   Autoimmune disease 08/06/2020   Blood transfusion without reported diagnosis    x3   Chronic back pain    ? RA, ? Lupus - reports saw rheumatologist in the past but better now, sees Dr. Malcolm   Eczema    Flushing 05/27/2021   GERD 02/13/2007   Qualifier: Diagnosis of  By: Norleen MD, Lynwood ORN    GERD (gastroesophageal reflux disease)    takes Omeprazole  daily, with nausea   Greater trochanteric bursitis of left hip 03/25/2019   Injected March 25, 2019   Heart murmur    Hemorrhoids    History of kidney stones    passed on her own   History of migraine    last one a yr ago and takes Zomig  prn   Hot flashes 11/10/2014   HTN (hypertension) 03/31/2012   Hx of echocardiogram    Echo (8/15):  EF 55-60%, no RWMA   IBS (irritable bowel syndrome)    constipation predominant   Interstitial cystitis    hx of UTI   Irritable bowel syndrome 12/14/2009   Qualifier: Diagnosis of  By: Avram MD, NOLIA Lupita BRAVO    Memory loss 12/22/2016   Numbness    both legs and related to back   Osteoporosis    osteopenia   Palpitations    on metoprolol  in past but made BP too low   Pneumonia    hx  of;last time 2yrs ago   PONV (postoperative nausea and vomiting)    laryngospasm-1999   Poor vision 10/15/2015   -? Ocular rosacea -sees opthomology    Raynaud's disease 10/21/2012   Raynaud's disease /phenomenon 10/21/2012   no meds, worse in the winter or cold environment.    Rheumatoid aortitis    uses Diclofenac gel;Rheumatoid   Seasonal allergies    Flonase  daily    Strain of left piriformis muscle 05/18/2019   Urinary incontinence    Past Surgical History:  Procedure Laterality Date   ABDOMINAL HYSTERECTOMY  1997   BREAST BIOPSY  1999   benign   COLONOSCOPY  02/11/2013 and 12/24/04   internal and external hemorrhoids   ENDOMETRIAL ABLATION     EXCISION MORTON'S NEUROMA Right    FLEXIBLE SIGMOIDOSCOPY  03/07/2008   internal and external hemorrhoids   POSTERIOR LUMBAR FUSION  05/02/2013   L4-L5 fusion (cage/screws/bone graft) Dr. Louis   PR ARTHRP INTERCARPAL/CARP/MTCRPL JT SUSPENSION  ARTHROPLASTY, INTERCARPAL OR CARPOMETACARPAL JOINTS; SUSPENSION, INCLUDING TRANSFER OR TRANSPLANT OF TENDON, WITH INTERPOSITION, WHEN PERFORMED  04/2024  TONSILLECTOMY     UPPER GASTROINTESTINAL ENDOSCOPY  10/23/2010   gastritis, irregular Z-line   wisdom teeth extracted     Patient Active Problem List   Diagnosis Date Noted   Age-related osteoporosis without current pathological fracture 06/08/2024   Arthritis of carpometacarpal [facility]) joint of left thumb 03/07/2024   Primary osteoarthritis of both first carpometacarpal joints 06/16/2023   Primary osteoarthritis 06/16/2023   Mass of upper outer quadrant of left breast 05/29/2022   Primary hypertension 02/14/2022   Encounter for long-term current use of medication 05/27/2021   Autoimmune disease 08/06/2020   Arthritis of carpometacarpal [facility]) joint of right thumb 05/08/2020   Cervical spondylosis 12/19/2019   B12 deficiency 11/03/2019   Status post lumbar spinal fusion 08/03/2019   Spondylosis without myelopathy or radiculopathy, lumbar  region 08/03/2019   DDD (degenerative disc disease), lumbar 05/18/2019   Panic disorder 01/05/2019   Hormone replacement therapy 12/29/2018   Sleep disturbance 12/29/2018   Eczema 12/29/2018   PTSD (post-traumatic stress disorder) 05/30/2018   Recurrent major depression in partial remission 11/10/2014   Hot flashes 11/10/2014   Carpal tunnel syndrome 08/24/2013   Spinal stenosis, lumbar region, 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   Raynaud's disease 10/21/2012   Heart palpitations 02/16/2012    ONSET DATE: DOS 05/09/24  REFERRING DIAG: M18.11 (ICD-10-CM) - Arthritis of carpometacarpal (CMC) joint of right thumb   THERAPY DIAG:  Muscle weakness (generalized)  Pain in right hand  Other lack of coordination  Localized edema  Stiffness of right hand, not elsewhere classified  Rationale for Evaluation and Treatment: Rehabilitation  PERTINENT HISTORY: History of arthritis leading to [facility] joint arthroplasty She states that she had a surgery at [facility], but she has not been doing any therapy yet.  She has a hand-based brace that was made for her last week but it is uncomfortable and hurting her.  Today she has a deformed looking brace that is rubbing her and causing pressure and pain which will need replaced.    PRECAUTIONS: None  RED FLAGS: None   WEIGHT BEARING RESTRICTIONS: Yes <5# weight bearing for next 4 weeks   SUBJECTIVE:   SUBJECTIVE STATEMENT: She is ~8 weeks post Rt CMC J arthroplasty (mini tightrope procedure).    She states she is feeling a lot of stiffness, and she also admits to only stretching maybe twice a day recently.  She has also been removing her orthosis to work on putty and more increasingly difficult home activities.  OT encourages her to stretch at least 4 times a day to help balance out all of her activity with relaxing stretches.     PAIN:  Are you having pain? Yes: NPRS scale:   0/10 at rest now, pain at worst in past week up to 2-3/10  at times  Pain location: Right thumb surgical area Pain description: Sore at times and sometimes shooting pain Aggravating factors: Movement or weightbearing Relieving factors: Rest   PATIENT GOALS: To safely improve the use of her right dominant hand and thumb  NEXT MD VISIT: As needed   OBJECTIVE: (All objective assessments below are from initial evaluation on: 06/14/24 unless otherwise specified.)   HAND DOMINANCE: Right   ADLs: Overall ADLs: States decreased ability to grab, hold household objects, pain and difficulty to open containers, perform FMS tasks (manipulate fasteners on clothing), mild to moderate bathing problems as well.    FUNCTIONAL OUTCOME MEASURES: Eval: Patient Specific Functional Scale: 6.6 (ride bike, roller skate, tie shoes, unscrew tops, type)  (  Higher Score  =  Better Ability for the Selected Tasks)      UPPER EXTREMITY ROM     Shoulder to Wrist AROM Right eval Rt 06/20/24 Rt 07/01/24 Rt 07/12/24  Forearm supination 85     Forearm pronation  80     Wrist flexion 57 55 54 47  Wrist extension 46 40 43 37  Wrist ulnar deviation      Wrist radial deviation      Functional dart thrower's motion (F-DTM) in ulnar flexion      F-DTM in radial extension       (Blank rows = not tested)   Hand AROM Right eval Rt 06/20/24 Rt 07/01/24 Rt 07/12/24  Full Fist Ability (or Gap to Distal Palmar Crease) Can almost make a full fist, stiffness  Full fist   Thumb Opposition  (Kapandji Scale)  5/10  6/10 7/10 7/10  Thumb MCP (0-60) 0-16 0 - 27 0 -28 0 - 24  Thumb IP (0-80) 0-40 0 - 54 0 - 78 0 - 53  Thumb Radial Abduction Span      Thumb Palmar Abduction Span      (Blank rows = not tested)   HAND FUNCTION: 07/12/24: Grip Rt: 26#     07/01/24: Grip strength Right: 15 lbs, Left: 42 lbs   COORDINATION: 07/01/24: 9 Hole Peg Test Right: 20.6sec (19.5 sec is AVG and 25 sec is WFL)   SENSATION: Eval:  Light touch intact today, though diminished around sx  area    EDEMA:   Eval:  Only very mildly swollen in Rt hand and wrist today  OBSERVATIONS:   Eval: scar well healing but mildly adherent, difficulty opening transverse arch of palm, looks fairly stiff at thumb MP J    Rt thumb CMC J A   TODAY'S TREATMENT:  07/12/24: Her grip strength is significantly improved, however her motion and flexibility is decreased.  This is typical when folks start using her hand for more tasks and doing more functional strengthening and also a band and stretching.  OT cautioned her that she needs to stretch 4 times a day for several more weeks, and encourages her to try not to give up on that.  OT does manual therapy IASTM and manual stretches to help alleviate tightness, also suggesting to do thumb palmar abduction stretches and also some gentle radial abduction stretches in pain-free ways.  OT also educates on median nerve gliding because she states having some tender nerve sensitivity through the carpal tunnel and base of the thenar eminence.  She states she does feel good tension while performing this activity. We also briefly reviewed putty activities, and OT also educates that she can perform isometric grip training on a towel if she cannot pack her putty with her while she travels.  Apparently she is traveling to [place]  over the weekend, and OT would like to make her exercises more accessible to her by not needing any specific equipment.  She should still avoid any lifting more than 5 pounds and she should focus on stretching and pain relief and gentle weaning from the orthosis instead of heavy and strong and agitating activities.  She states understanding all directions and new ideas for stretches and leaves in no significant pain.   Exercises (updated today) - Median Nerve Flossing  - 2-3 x daily - 5-10 reps - Wrist Flexion Stretch  - 4 x daily - 5 reps - 15 sec hold - Wrist Prayer Stretch  - 4  x daily - 3-5 reps - 15 sec hold - Stretch thumb toward  base of small finger (put hand in LAP)  - 2-3 x daily - 3-5 reps - 15 sec hold - Finger Pinch and Pull with Putty  - 1-2 x daily - 5-6 x weekly - 5 reps - 10 sec hold - Full Fist  - 1-2 x daily - 5-6 x weekly - 5 reps - 10 sec hold - Thumb Opposition with Putty  - 2-3 x daily - 5 reps - Thumb Press  - 2-3 x daily - 5 reps   PATIENT EDUCATION: Education details: See tx section above for details  Person educated: Patient Education method: Engineer, Structural, Teach back, Handouts  Education comprehension: States and demonstrates understanding, Additional Education required    HOME EXERCISE PROGRAM: Access Code: [email or web address] URL: [email or web address] Date: 06/14/2024 Prepared by: Melvenia Ada   GOALS: Goals reviewed with patient? Yes   SHORT TERM GOALS: (STG required if POC>30 days) Target Date: 06/24/24  Pt will obtain protective, custom orthotic. Goal status: 06/14/24:  MET   2.  Pt will demo/state understanding of initial HEP to improve pain levels and prerequisite motion. Goal status: 07/01/2024: Goal met   LONG TERM GOALS: Target Date: 07/22/24  Pt will improve functional ability by decreased impairment per PSFS assessment from 6.6 to 8 or better, for better quality of life. Goal status: INITIAL  2.  Pt will improve grip strength in Rt hand to at least 25lbs for functional use at home and in IADLs. Goal status: INITIAL  3.  Pt will improve A/ROM in Rt thumb MP J flexion from 16* to at least 30*, to have functional motion for tasks like reach and grasp.  Goal status: INITIAL  4.  Pt will improve coordination skills in Rt hand, as seen by within functional limit score on 9HPT testing to have increased functional ability to carry out fine motor tasks (fasteners, etc.) and more complex, coordinated IADLs (meal prep, sports, etc.).  Goal status: INITIAL  5.  Pt will decrease pain at worst from 4/10 to 1/10 or better to have better sleep and occupational  participation in daily roles. Goal status: INITIAL   ASSESSMENT:  CLINICAL IMPRESSION: 07/12/24: She is a bit stiffer now, complaining of some nerve pain and pain from stiffness, and is likely due to doing less stretches and doing more activities that are harsh.  She states pain when trying to open a jar, but opening tight jars has not been recommended yet.  OT highly emphasizes that she do more relaxed stretching at least 4 times a day and not rush this process.  PLAN:  OT FREQUENCY: 1x/week  OT DURATION: 5 weeks through 07/23/23 and up to 6 total visits as needed   PLANNED INTERVENTIONS: 97535 self care/ADL training, [id number] therapeutic exercise, 97530 therapeutic activity, 97112 neuromuscular re-education, 97140 manual therapy, 97035 ultrasound, 97032 electrical stimulation (manual), 97760 Orthotic Initial, [id number] Orthotic/Prosthetic subsequent, compression bandaging, Dry needling, energy conservation, coping strategies training, and patient/family education  CONSULTED AND AGREED WITH PLAN OF CARE: Patient  PLAN FOR NEXT SESSION:   Perform a progress note next week to determine the need for additional therapy services and any needed modifications to the plan of care  Melvenia Ada, OTR/L, CHT 07/12/2024, 12:27 PM  "

## 2024-07-09 ENCOUNTER — Other Ambulatory Visit (HOSPITAL_COMMUNITY): Payer: Self-pay

## 2024-07-10 ENCOUNTER — Other Ambulatory Visit (HOSPITAL_COMMUNITY): Payer: Self-pay

## 2024-07-12 ENCOUNTER — Encounter: Payer: Self-pay | Admitting: Rehabilitative and Restorative Service Providers"

## 2024-07-12 ENCOUNTER — Ambulatory Visit (INDEPENDENT_AMBULATORY_CARE_PROVIDER_SITE_OTHER): Admitting: Rehabilitative and Restorative Service Providers"

## 2024-07-12 DIAGNOSIS — M25641 Stiffness of right hand, not elsewhere classified: Secondary | ICD-10-CM

## 2024-07-12 DIAGNOSIS — R278 Other lack of coordination: Secondary | ICD-10-CM

## 2024-07-12 DIAGNOSIS — M79641 Pain in right hand: Secondary | ICD-10-CM

## 2024-07-12 DIAGNOSIS — M6281 Muscle weakness (generalized): Secondary | ICD-10-CM | POA: Diagnosis not present

## 2024-07-12 DIAGNOSIS — R600 Localized edema: Secondary | ICD-10-CM | POA: Diagnosis not present

## 2024-07-19 ENCOUNTER — Encounter: Payer: Self-pay | Admitting: Rehabilitative and Restorative Service Providers"

## 2024-07-19 ENCOUNTER — Ambulatory Visit: Admitting: Rehabilitative and Restorative Service Providers"

## 2024-07-19 DIAGNOSIS — R600 Localized edema: Secondary | ICD-10-CM

## 2024-07-19 DIAGNOSIS — R278 Other lack of coordination: Secondary | ICD-10-CM

## 2024-07-19 DIAGNOSIS — M25641 Stiffness of right hand, not elsewhere classified: Secondary | ICD-10-CM | POA: Diagnosis not present

## 2024-07-19 DIAGNOSIS — M79641 Pain in right hand: Secondary | ICD-10-CM

## 2024-07-19 DIAGNOSIS — M6281 Muscle weakness (generalized): Secondary | ICD-10-CM

## 2024-07-19 NOTE — Therapy (Signed)
 " OUTPATIENT OCCUPATIONAL THERAPY TREATMENT & discharge NOTE  Patient Name: [name] MRN: [id number] DOB:1956/03/17, 69 y.o., female Today's Date: 07/19/2024  PCP: Catherine HAMS DO REFERRING PROVIDER: Claudene Arthea HERO, DO               OCCUPATIONAL THERAPY DISCHARGE SUMMARY  Visits from Start of Care: 5  Current functional level related to goals / functional outcomes:  CLINICAL IMPRESSION: 07/19/24: She has met all of her long-term goals except for grip strength which was previously met.  She is very good motion, no more pain, no more significant functional deficits.  We did discuss continuing stretches and grip training progressively over the next 2 to 4 weeks.  She feels comfortable doing this on her own now.  Will successfully discharge therapy now  Education / Equipment: Pt has all needed materials and education. Pt understands how to continue on with self-management. See tx notes for more details.   Patient agrees to discharge due to max benefits received from outpatient occupational therapy / hand therapy at this time.   Melvenia Ada, OTR/L, CHT 07/19/24             END OF SESSION:  OT End of Session - 07/19/24 1055     Visit Number 5    Number of Visits 6    Date for Recertification  07/22/24    Authorization Type Medicare    OT Start Time 1056    OT Stop Time 1134    OT Time Calculation (min) 38 min    Activity Tolerance Patient tolerated treatment well;No increased pain;Patient limited by fatigue    Behavior During Therapy [facility] for tasks assessed/performed              Past Medical History:  Diagnosis Date   Allergy    Anemia    after birth of child 13yrs ago   Anxiety and depression    with insomnia   Autoimmune disease 08/06/2020   Blood transfusion without reported diagnosis    x3   Chronic back pain    ? RA, ? Lupus - reports saw rheumatologist in the past but better now, sees Dr. Malcolm   Eczema    Flushing 05/27/2021    GERD 02/13/2007   Qualifier: Diagnosis of  By: Norleen MD, Lynwood ORN    GERD (gastroesophageal reflux disease)    takes Omeprazole  daily, with nausea   Greater trochanteric bursitis of left hip 03/25/2019   Injected March 25, 2019   Heart murmur    Hemorrhoids    History of kidney stones    passed on her own   History of migraine    last one a yr ago and takes Zomig  prn   Hot flashes 11/10/2014   HTN (hypertension) 03/31/2012   Hx of echocardiogram    Echo (8/15):  EF 55-60%, no RWMA   IBS (irritable bowel syndrome)    constipation predominant   Interstitial cystitis    hx of UTI   Irritable bowel syndrome 12/14/2009   Qualifier: Diagnosis of  By: Avram MD, NOLIA Lupita BRAVO    Memory loss 12/22/2016   Numbness    both legs and related to back   Osteoporosis    osteopenia   Palpitations    on metoprolol  in past but made BP too low   Pneumonia    hx of;last time 4yrs ago   PONV (postoperative nausea and vomiting)    laryngospasm-1999   Poor vision 10/15/2015   -?  Ocular rosacea -sees opthomology    Raynaud's disease 10/21/2012   Raynaud's disease /phenomenon 10/21/2012   no meds, worse in the winter or cold environment.    Rheumatoid aortitis    uses Diclofenac gel;Rheumatoid   Seasonal allergies    Flonase  daily    Strain of left piriformis muscle 05/18/2019   Urinary incontinence    Past Surgical History:  Procedure Laterality Date   ABDOMINAL HYSTERECTOMY  1997   BREAST BIOPSY  1999   benign   COLONOSCOPY  02/11/2013 and 12/24/04   internal and external hemorrhoids   ENDOMETRIAL ABLATION     EXCISION MORTON'S NEUROMA Right    FLEXIBLE SIGMOIDOSCOPY  03/07/2008   internal and external hemorrhoids   POSTERIOR LUMBAR FUSION  05/02/2013   L4-L5 fusion (cage/screws/bone graft) Dr. Louis   PR ARTHRP INTERCARPAL/CARP/MTCRPL JT SUSPENSION  ARTHROPLASTY, INTERCARPAL OR CARPOMETACARPAL JOINTS; SUSPENSION, INCLUDING TRANSFER OR TRANSPLANT OF TENDON, WITH INTERPOSITION, WHEN  PERFORMED  04/2024   TONSILLECTOMY     UPPER GASTROINTESTINAL ENDOSCOPY  10/23/2010   gastritis, irregular Z-line   wisdom teeth extracted     Patient Active Problem List   Diagnosis Date Noted   Age-related osteoporosis without current pathological fracture 06/08/2024   Arthritis of carpometacarpal (CMC) joint of left thumb 03/07/2024   Primary osteoarthritis of both first carpometacarpal joints 06/16/2023   Primary osteoarthritis 06/16/2023   Mass of upper outer quadrant of left breast 05/29/2022   Primary hypertension 02/14/2022   Encounter for long-term current use of medication 05/27/2021   Autoimmune disease 08/06/2020   Arthritis of carpometacarpal [facility]) joint of right thumb 05/08/2020   Cervical spondylosis 12/19/2019   B12 deficiency 11/03/2019   Status post lumbar spinal fusion 08/03/2019   Spondylosis without myelopathy or radiculopathy, lumbar region 08/03/2019   DDD (degenerative disc disease), lumbar 05/18/2019   Panic disorder 01/05/2019   Hormone replacement therapy 12/29/2018   Sleep disturbance 12/29/2018   Eczema 12/29/2018   PTSD (post-traumatic stress disorder) 05/30/2018   Recurrent major depression in partial remission 11/10/2014   Hot flashes 11/10/2014   Carpal tunnel syndrome 08/24/2013   Spinal stenosis, lumbar region, 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   Raynaud's disease 10/21/2012   Heart palpitations 02/16/2012    ONSET DATE: DOS 05/09/24  REFERRING DIAG: M18.11 (ICD-10-CM) - Arthritis of carpometacarpal (CMC) joint of right thumb   THERAPY DIAG:  Muscle weakness (generalized)  Localized edema  Other lack of coordination  Pain in right hand  Stiffness of right hand, not elsewhere classified  Rationale for Evaluation and Treatment: Rehabilitation  PERTINENT HISTORY: History of arthritis leading to [facility] joint arthroplasty She states that she had a surgery at [facility], but she has not been doing any therapy yet.  She has a  hand-based brace that was made for her last week but it is uncomfortable and hurting her.  Today she has a deformed looking brace that is rubbing her and causing pressure and pain which will need replaced.    PRECAUTIONS: None  RED FLAGS: None   WEIGHT BEARING RESTRICTIONS: Yes <5# weight bearing for next 4 weeks   SUBJECTIVE:   SUBJECTIVE STATEMENT: She is ~10 weeks post Rt CMC J arthroplasty (mini tightrope procedure).    She states she is not having any pain, she just feels mildly stiff, a concern of hers is feeling weak with grip strength.    PAIN:  Are you having pain? No pain now    PATIENT GOALS: To safely improve the use of her  right dominant hand and thumb  NEXT MD VISIT: As needed   OBJECTIVE: (All objective assessments below are from initial evaluation on: 06/14/24 unless otherwise specified.)   HAND DOMINANCE: Right   ADLs: Overall ADLs: States no longer having significant functional problems-just with heavier activities requiring a tighter grip   FUNCTIONAL OUTCOME MEASURES: 07/19/24: PSFS: 7.8   Eval: Patient Specific Functional Scale: 6.6 (ride bike, roller skate, tie shoes, unscrew tops, type)  (Higher Score  =  Better Ability for the Selected Tasks)      UPPER EXTREMITY ROM     Shoulder to Wrist AROM Right eval Rt 06/20/24 Rt 07/01/24 Rt 07/12/24 Rt 07/19/24  Forearm supination 85      Forearm pronation  80      Wrist flexion 57 55 54 47 50  Wrist extension 46 40 43 37 46  Wrist ulnar deviation       Wrist radial deviation       Functional dart thrower's motion (F-DTM) in ulnar flexion       F-DTM in radial extension        (Blank rows = not tested)   Hand AROM Right eval Rt 06/20/24 Rt 07/01/24 Rt 07/12/24 Rt 07/19/24  Full Fist Ability (or Gap to Distal Palmar Crease) Can almost make a full fist, stiffness  Full fist    Thumb Opposition  (Kapandji Scale)  5/10  6/10 7/10 7/10 7/10  Thumb MCP (0-60) 0-16 0 - 27 0 -28 0 - 24 0 - 31  Thumb  IP (0-80) 0-40 0 - 54 0 - 78 0 - 53 0 - 60  Thumb Radial Abduction Span       Thumb Palmar Abduction Span       (Blank rows = not tested)   HAND FUNCTION: 07/19/24:  Grip Rt: 19#    07/12/24: Grip Rt: 26#   07/01/24: Grip strength Right: 15 lbs, Left: 42 lbs   COORDINATION: 07/01/24: 9 Hole Peg Test Right: 20.6sec (19.5 sec is AVG and 25 sec is WFL)   OBSERVATIONS:   07/19/2024: Patient no longer has any nerve sensitivities, no significant pain, stable at the thumb and the wrist.  Grip strength is a bit weak, but she will continue to work on this   TODAY'S TREATMENT:  07/19/24:   Pt performs AROM, gripping, and strength with Rt hand/arm against therapist's resistance for exercise/activities as well as new measures today. OT also discusses home and functional tasks with the pt and reviews goals.  She has now met all long-term goals except for her grip strength which was previously met but is mysteriously lower today.  This could be because she was on vacation and not doing grip training enough.  To help with this, OT gives out to her for therapy putty and also gives out a stress ball that she can use to mimic cuff inflation activities that she must do at work.  OT does tell her that is normal to have a bit of weakness still and to continue to work on this strength training for 2-4 more weeks as needed.  She was encouraged not to overdo anything with too much repetition, and take a break if she is having any pain.  She should always strengthen after warm up and stretch.  Additionally she was given new wrist strengthening exercises as bolded below.  She tolerated these well.  Pt states understanding and tolerates upgrades well.  She states she is comfortable with discharging to her own  home exercise program at this point, she would also like the therapist to send a note to her surgeon with her status.   She was reminded that she should use conservative methods to help her surgery last as long as  possible, light compression gloves, like using tools rather than doing painful straining, continue to stretch as needed.  She can use her orthosis in the future if she is doing anything heavy or repetitive, but she can wean out of it at this point as tolerated.   Exercises performed/reviewed today: - Wrist Flexion Stretch  - 4 x daily - 5 reps - 15 sec hold - Wrist Prayer Stretch  - 4 x daily - 3-5 reps - 15 sec hold - Stretch thumb toward base of small finger (put hand in LAP)  - 2-3 x daily - 3-5 reps - 15 sec hold - Finger Pinch and Pull with Putty  - 1-2 x daily - 5-6 x weekly - 5 reps - 10 sec hold - Full Fist  - 1-2 x daily - 5-6 x weekly - 5 reps - 10 sec hold - Thumb Opposition with Putty  - 2-3 x daily - 5 reps - Thumb Press  - 2-3 x daily - 5 reps - Wrist Extension with Resistance  - 2-3 x daily - 5 x weekly - 1-2 sets - 10-15 reps - Wrist Flexion with Resistance  - 2-3 x daily - 5 x weekly - 1-2 sets - 10-15 reps     07/12/24: Her grip strength is significantly improved, however her motion and flexibility is decreased.  This is typical when folks start using her hand for more tasks and doing more functional strengthening and also a band and stretching.  OT cautioned her that she needs to stretch 4 times a day for several more weeks, and encourages her to try not to give up on that.  OT does manual therapy IASTM and manual stretches to help alleviate tightness, also suggesting to do thumb palmar abduction stretches and also some gentle radial abduction stretches in pain-free ways.  OT also educates on median nerve gliding because she states having some tender nerve sensitivity through the carpal tunnel and base of the thenar eminence.  She states she does feel good tension while performing this activity. We also briefly reviewed putty activities, and OT also educates that she can perform isometric grip training on a towel if she cannot pack her putty with her while she travels.   Apparently she is traveling to [place]  over the weekend, and OT would like to make her exercises more accessible to her by not needing any specific equipment.  She should still avoid any lifting more than 5 pounds and she should focus on stretching and pain relief and gentle weaning from the orthosis instead of heavy and strong and agitating activities.  She states understanding all directions and new ideas for stretches and leaves in no significant pain.   Exercises (updated today) - Median Nerve Flossing  - 2-3 x daily - 5-10 reps - Wrist Flexion Stretch  - 4 x daily - 5 reps - 15 sec hold - Wrist Prayer Stretch  - 4 x daily - 3-5 reps - 15 sec hold - Stretch thumb toward base of small finger (put hand in LAP)  - 2-3 x daily - 3-5 reps - 15 sec hold - Finger Pinch and Pull with Putty  - 1-2 x daily - 5-6 x weekly - 5 reps - 10 sec  hold - Full Fist  - 1-2 x daily - 5-6 x weekly - 5 reps - 10 sec hold - Thumb Opposition with Putty  - 2-3 x daily - 5 reps - Thumb Press  - 2-3 x daily - 5 reps   PATIENT EDUCATION: Education details: See tx section above for details  Person educated: Patient Education method: Engineer, Structural, Teach back, Handouts  Education comprehension: States and demonstrates understanding, Additional Education required    HOME EXERCISE PROGRAM: Access Code: [email or web address] URL: [email or web address] Date: 06/14/2024 Prepared by: Melvenia Ada   GOALS: Goals reviewed with patient? Yes   SHORT TERM GOALS: (STG required if POC>30 days) Target Date: 06/24/24  Pt will obtain protective, custom orthotic. Goal status: 06/14/24:  MET   2.  Pt will demo/state understanding of initial HEP to improve pain levels and prerequisite motion. Goal status: 07/01/2024: Goal met   LONG TERM GOALS: Target Date: 07/22/24  Pt will improve functional ability by decreased impairment per PSFS assessment from 6.6 to 8 or better, for better quality of life. Goal  status: 07/19/24: now 7.8, considered MET  2.  Pt will improve grip strength in Rt hand to at least 25lbs for functional use at home and in IADLs. Goal status: 07/19/24: she met this in the past, but grip is down today. We discussed increasing frequency of grip training.   3.  Pt will improve A/ROM in Rt thumb MP J flexion from 16* to at least 30*, to have functional motion for tasks like reach and grasp.  Goal status: 07/19/24: MET  4.  Pt will improve coordination skills in Rt hand, as seen by within functional limit score on 9HPT testing to have increased functional ability to carry out fine motor tasks (fasteners, etc.) and more complex, coordinated IADLs (meal prep, sports, etc.).  Goal status: 07/19/24: MET  5.  Pt will decrease pain at worst from 4/10 to 1/10 or better to have better sleep and occupational participation in daily roles. Goal status: 07/19/24: MET   ASSESSMENT:  CLINICAL IMPRESSION: 07/19/24: She has met all of her long-term goals except for grip strength which was previously met.  She is very good motion, no more pain, no more significant functional deficits.  We did discuss continuing stretches and grip training progressively over the next 2 to 4 weeks.  She feels comfortable doing this on her own now.  Will successfully discharge therapy now   PLAN:  OT FREQUENCY: Discharge  OT DURATION: Discharge  PLANNED INTERVENTIONS: 97535 self care/ADL training, 02889 therapeutic exercise, 97530 therapeutic activity, 97112 neuromuscular re-education, 97140 manual therapy, 97035 ultrasound, [id number] electrical stimulation (manual), [id number] Orthotic Initial, [id number] Orthotic/Prosthetic subsequent, compression bandaging, Dry needling, energy conservation, coping strategies training, and patient/family education  CONSULTED AND AGREED WITH PLAN OF CARE: Patient  PLAN FOR NEXT SESSION:   N/A/discharge  Melvenia Ada, OTR/L, CHT 07/19/2024, 11:41 AM  "

## 2024-07-20 ENCOUNTER — Other Ambulatory Visit (HOSPITAL_COMMUNITY): Payer: Self-pay

## 2024-07-26 ENCOUNTER — Ambulatory Visit: Admitting: Psychiatry

## 2024-07-26 DIAGNOSIS — F411 Generalized anxiety disorder: Secondary | ICD-10-CM

## 2024-07-26 NOTE — Progress Notes (Signed)
 "       [facility] Counselor/Therapist Progress Note  Patient ID: [name], MRN: [id number],    Date: 07/26/2024  Time Spent: 55 minutes   Treatment Type: Individual Therapy  Reported Symptoms: anxiety, depression, anger, frustration  (re: husband and other issues)   Mental Status Exam:  Appearance:   Casual and Neat     Behavior:  Appropriate and Sharing  Motor:  Normal  Speech/Language:   Clear and Coherent  Affect:  Depressed and anxious  Mood:  anxious and depressed  Thought process:  goal directed  Thought content:    Rumination  Sensory/Perceptual disturbances:    WNL  Orientation:  oriented to person, place, time/date, situation, day of week, month of year, year, and stated date of Jan. 12, 2026  Attention:  Good  Concentration:  Good  Memory:  WNL  Fund of knowledge:   Good  Insight:    Good  Judgment:   Good  Impulse Control:  Good   Risk Assessment: Danger to Self:  No Self-injurious Behavior: No Danger to Others: No Duty to Warn:no Physical Aggression / Violence:No  Access to Firearms a concern: No  Gang Involvement:No   Subjective:   Patient in for session today frustrated, anxious, angry, and some depression mostly related to work issues and interpersonal  issues with husband. I feel like he is on a different planet and we still live together like room-mates. Does feel she may have made some progress as she continues to manage multiple stressors mentally and emotionally. Today continues working through her anger, hurt, and frustration due to medical and work issues, focusing on venting her frustration and anger in healthier ways and being able to let go of things out of her control. Continues to heal after her surgery and feeling more encouraged about her healing but taking more time that she had thought.  Husband refuses counseling with a marriage therapist as discussed last session.  More direct in sessions, less avoidant, and able to sometimes see  her own partner and issues with husband.  Seems to have a clearer vision of her future, and wanting to be able to look forward to that future.  Looking more at what is in her control versus what is not in her control which is a positive for her.   Interventions: Cognitive Behavioral Therapy, Solution-Oriented/Positive Psychology, and Ego-Supportive Long term goal: Develop healthy cognitive patterns and beliefs about self and the world that lead to alleviation of depression and anxiety and help prevent relapse.  Progressing: Patient is motivated. Struggling to focus some due to immediate, unexpected health concern with husband.  Short term goal: Learn and implement personal skills for managing stress, solving daily problems, and resolving conflicts effectively. Strategy: Teach patient calming skills, problem solving skills, and conflict resolution skills, to better manage daily stressors  Diagnosis:   ICD-10-CM   1. Generalized anxiety disorder  F41.1      Plan:   Patient today calm and very focused on her coping skills and getting through this time following her surgery and recuperating.  She will likely return to work later this month.  More calm, more focused, and realizing what I can change and what I cannot change, personally with him myself, and also within the marital relationship with husband.  Seems to be feeling more strength within herself today more hopeful, and a sense of humor at times.  Still managing a lot of stress but trying to have a different approach and attitude.  Therapist continues to remind patient and encouraged her in her practice of more positive/self affirming behaviors as noted in sessions including: Taking breaks as needed throughout the day to experience more calmness, use of deep breathing exercises, taking her medications as prescribed, exercising on her bike to help deal with stress, setting limits to better manage her stress, making healthy sleep patterns a  priority, stay in touch with supportive people, working on letting go of her negativity and over worrying, healthy boundaries with others, and recognize the strength she shows when working with goal-directed behaviors to help her move in a direction that supports her overall improved emotional health, outlook, and helping her gain more of a sense of hopefulness into her future.  Goal review and progress/challenges noted with patient.  Next appointment within 3 weeks.   Barnie Bunde, LCSW                   "

## 2024-07-28 ENCOUNTER — Ambulatory Visit: Admitting: Family Medicine

## 2024-07-28 ENCOUNTER — Encounter: Payer: Self-pay | Admitting: Family Medicine

## 2024-07-28 ENCOUNTER — Other Ambulatory Visit: Payer: Self-pay

## 2024-07-28 VITALS — BP 128/84 | HR 64 | Ht 62.0 in | Wt 117.0 lb

## 2024-07-28 DIAGNOSIS — M1812 Unilateral primary osteoarthritis of first carpometacarpal joint, left hand: Secondary | ICD-10-CM | POA: Diagnosis not present

## 2024-07-28 DIAGNOSIS — M79645 Pain in left finger(s): Secondary | ICD-10-CM

## 2024-07-28 DIAGNOSIS — G8929 Other chronic pain: Secondary | ICD-10-CM

## 2024-07-28 NOTE — Patient Instructions (Signed)
 Injection in thumb today Good to see you! See you again in

## 2024-07-28 NOTE — Progress Notes (Signed)
 " [name] [address] [place] [address] Phone: [phone] Subjective:   ISusannah [name], am serving as [name] scribe for Dr. Arthea [name].  I'm seeing this patient by the request  of:  [name], Carol A, DO  CC: Left thumb pain  YEP:Dlagzrupcz  03/07/2024 Patient responded extremely well to this.  Discussed icing regimen and home exercises.  Discussed with patient not to do the icing within the first 72 hours.  Post PRP handout given.     Updated 07/28/2024 [name] is [name] 69 y.o. female coming in with complaint of L thumb pain. Having L thumb pain. Getting worse.  Affecting daily activities.  Patient did have [name] replacement noted as well.       Past Medical History:  Diagnosis Date   Allergy    Anemia    after birth of child 88yrs ago   Anxiety and depression    with insomnia   Autoimmune disease 08/06/2020   Blood transfusion without reported diagnosis    x3   Chronic back pain    ? RA, ? Lupus - reports saw rheumatologist in the past but better now, sees Dr. Malcolm   Eczema    Flushing 05/27/2021   GERD 02/13/2007   Qualifier: Diagnosis of  By: [name], [name]    GERD (gastroesophageal reflux disease)    takes Omeprazole  daily, with nausea   Greater trochanteric bursitis of left hip 03/25/2019   Injected March 25, 2019   Heart murmur    Hemorrhoids    History of kidney stones    passed on her own   History of migraine    last one [name] yr ago and takes Zomig  prn   Hot flashes 11/10/2014   HTN (hypertension) 03/31/2012   Hx of echocardiogram    Echo (8/15):  EF 55-60%, no RWMA   IBS (irritable bowel syndrome)    constipation predominant   Interstitial cystitis    hx of UTI   Irritable bowel syndrome 12/14/2009   Qualifier: Diagnosis of  By: [name], [name]    Memory loss 12/22/2016   Numbness    both legs and related to back   Osteoporosis    osteopenia   Palpitations    on metoprolol  in past but made  BP too low   Pneumonia    hx of;last time 2yrs ago   PONV (postoperative nausea and vomiting)    laryngospasm-1999   Poor vision 10/15/2015   -? Ocular rosacea -sees opthomology    Raynaud's disease 10/21/2012   Raynaud's disease /phenomenon 10/21/2012   no meds, worse in the winter or cold environment.    Rheumatoid aortitis    uses Diclofenac gel;Rheumatoid   Seasonal allergies    Flonase  daily    Strain of left piriformis muscle 05/18/2019   Urinary incontinence    Past Surgical History:  Procedure Laterality Date   ABDOMINAL HYSTERECTOMY  1997   BREAST BIOPSY  1999   benign   COLONOSCOPY  02/11/2013 and 12/24/04   internal and external hemorrhoids   ENDOMETRIAL ABLATION     EXCISION MORTON'S NEUROMA Right    FLEXIBLE SIGMOIDOSCOPY  03/07/2008   internal and external hemorrhoids   POSTERIOR LUMBAR FUSION  05/02/2013   L4-L5 fusion (cage/screws/bone graft) Dr. Louis   PR ARTHRP INTERCARPAL/CARP/MTCRPL JT SUSPENSION  ARTHROPLASTY, INTERCARPAL OR CARPOMETACARPAL JOINTS; SUSPENSION, INCLUDING TRANSFER OR TRANSPLANT OF TENDON, WITH INTERPOSITION, WHEN PERFORMED  04/2024  TONSILLECTOMY     UPPER GASTROINTESTINAL ENDOSCOPY  10/23/2010   gastritis, irregular Z-line   wisdom teeth extracted     Social History   Socioeconomic History   Marital status: Married    Spouse name: Not on file   Number of children: Not on file   Years of education: Not on file   Highest education level: Not on file  Occupational History   Occupation: [employer]    Employer: [employer]  Tobacco Use   Smoking status: Never   Smokeless tobacco: Never  Vaping Use   Vaping status: Never Used  Substance and Sexual Activity   Alcohol use: Yes    Alcohol/week: 2.0 standard drinks of alcohol    Types: 1 Shots of liquor, 1 Standard drinks or equivalent per week    Comment: one margarita [name] week   Drug use: No   Sexual activity: Not Currently    Partners: Male  Other Topics Concern   Not on file  Social  History Narrative   Spiritual Beliefs: methodist   Marital status/children/pets: In second marriage.  First husband committed suicide.  2 children.   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   Safety:      -Wears [name] bicycle helmet riding [name] bike: Yes     -smoke alarm in the home:No     - wears seatbelt: Yes     - Feels safe in their relationships: Yes            Social Drivers of Health   Tobacco Use: Low Risk (07/19/2024)   Patient History    Smoking Tobacco Use: Never    Smokeless Tobacco Use: Never    Passive Exposure: Not on file  Financial Resource Strain: Not on file  Food Insecurity: No Food Insecurity (06/08/2024)   Epic    Worried About Programme Researcher, Broadcasting/film/video in the Last Year: Never true    Ran Out of Food in the Last Year: Never true  Transportation Needs: No Transportation Needs (06/08/2024)   Epic    Lack of Transportation (Medical): No    Lack of Transportation (Non-Medical): No  Physical Activity: Sufficiently Active (06/08/2024)   Exercise Vital Sign    Days of Exercise per Week: 5 days    Minutes of Exercise per Session: 60 min  Stress: No Stress Concern Present (06/08/2024)   [employer] of Occupational Health - Occupational Stress Questionnaire    Feeling of Stress: Not at all  Social Connections: Moderately Integrated (06/08/2024)   Social Connection and Isolation Panel    Frequency of Communication with Friends and Family: Twice [name] week    Frequency of Social Gatherings with Friends and Family: Once [name] week    Attends Religious Services: Never    Database Administrator or Organizations: Yes    Attends Banker Meetings: 1 to 4 times per year    Marital Status: Married  Depression (PHQ2-9): Low Risk (06/08/2024)   Depression (PHQ2-9)    PHQ-2 Score: 1  Alcohol Screen: Low Risk (06/08/2024)   Alcohol Screen    Last Alcohol Screening Score (AUDIT): 0  Housing: Unknown (06/08/2024)   Epic    Unable to Pay for Housing in the Last  Year: No    Number of Times Moved in the Last Year: Not on file    Homeless in the Last Year: No  Utilities: Not At Risk (06/08/2024)   Epic    Threatened with loss of utilities: No  Health Literacy:  Adequate Health Literacy (06/08/2024)   B1300 Health Literacy    Frequency of need for help with medical instructions: Never   Allergies[1] Family History  Problem Relation Age of Onset   Diverticulosis Mother    Hypertension Mother    Osteoarthritis Mother    Depression Mother    Hyperlipidemia Mother    Kidney disease Mother    Coronary artery disease Father    Heart attack Father    Hypertension Father    Depression Father    Hyperlipidemia Father    Alcohol abuse Father    Early death Father    Kidney disease Father    Mental illness Father    Alcohol abuse Sister    Depression Sister    Colon polyps Sister    Kidney disease Sister    Alcohol abuse Daughter    Depression Daughter    Drug abuse Daughter    Alcohol abuse Son    Depression Son    Diabetes Paternal Uncle    Hypertension Maternal Grandmother    Stroke Maternal Grandmother    Hypertension Maternal Grandfather    Pancreatic cancer Maternal Grandfather    Early death Maternal Grandfather    Depression Paternal Grandmother    Dementia Paternal Grandmother    Arthritis Paternal Grandmother    Mental illness Paternal Grandmother    Stroke Paternal Grandfather    Hyperlipidemia Paternal Grandfather    Alcohol abuse Paternal Grandfather    Early death Paternal Grandfather    Hypertension Paternal Grandfather    Colon cancer Neg Hx     Current Outpatient Medications (Endocrine & Metabolic):    progesterone  (PROMETRIUM ) 100 MG capsule, Take 1-2 capsules (100-200 mg total) by mouth at bedtime.  Current Outpatient Medications (Cardiovascular):    amLODipine  (NORVASC ) 2.5 MG tablet, Take 1 tablet (2.5 mg total) by mouth daily.   atenolol  (TENORMIN ) 25 MG tablet, Take 1 tablet (25 mg total) by mouth  daily.  Current Outpatient Medications (Analgesics):    meloxicam  (MOBIC ) 15 MG tablet, Take 1 tablet (15 mg total) by mouth daily for joint pain.  Current Outpatient Medications (Hematological):    cyanocobalamin  (VITAMIN B12) 1000 MCG/ML injection, Inject 1 mL (1,000 mcg total) into the muscle every 14 (fourteen) days.  Current Outpatient Medications (Other):    ALPRAZolam  (XANAX ) 0.5 MG tablet, Take 1 tablet (0.5 mg total) by mouth 3 (three) times daily as needed for anxiety.   hydrocortisone  (PROCTOZONE -HC) 2.5 % rectal cream, Insert rectally 2 times daily for 10 days   ketoconazole  (NIZORAL ) 2 % shampoo, Use topically as directed for 30 days   lamoTRIgine  (LAMICTAL ) 200 MG tablet, Take 1 tablet (200 mg total) by mouth at bedtime.   omeprazole  (PRILOSEC) 40 MG capsule, Take 1 capsule (40 mg total) by mouth daily.   QUEtiapine  (SEROQUEL ) 50 MG tablet, Take 1 tablet (50 mg total) by mouth at bedtime.   SYRINGE-NEEDLE, DISP, 3 ML (B-D 3CC LUER-LOK SYR 25GX1) 25G X 1 3 ML MISC, Use as directed to inject b-12 into the skin   Tavaborole  (KERYDIN ) 5 % SOLN, Apply 1 drop topically daily. Apply 1 drop to the toenail daily.   Reviewed prior external information including notes and imaging from  primary care provider As well as notes that were available from care everywhere and other healthcare systems.  Past medical history, social, surgical and family history all reviewed in electronic medical record.  No pertanent information unless stated regarding to the chief complaint.   Review of Systems:  No headache, visual changes, nausea, vomiting, diarrhea, constipation, dizziness, abdominal pain, skin rash, fevers, chills, night sweats, weight loss, swollen lymph nodes, body aches, joint swelling, chest pain, shortness of breath, mood changes. POSITIVE muscle aches  Objective  Blood pressure 128/84, pulse 64, height 5' 2 (1.575 m), weight 117 lb (53.1 kg), SpO2 95%.   General: No apparent  distress alert and oriented x3 mood and affect normal, dressed appropriately.  HEENT: Pupils equal, extraocular movements intact  Respiratory: Patient's speak in full sentences and does not appear short of breath  Cardiovascular: No lower extremity edema, non tender, no erythema  Left CMC joint positive grind test noted.  Mild atrophy of the thenar eminence.  Right thumb does have replacement noted.  Procedure: Real-time Ultrasound Guided Injection of left CMC joint Device: GE Logiq Q7 Ultrasound guided injection is preferred based studies that show increased duration, increased effect, greater accuracy, decreased procedural pain, increased response rate, and decreased cost with ultrasound guided versus blind injection.  Verbal informed consent obtained.  Time-out conducted.  Noted no overlying erythema, induration, or other signs of local infection.  Skin prepped in [name] sterile fashion.  Local anesthesia: Topical Ethyl chloride.  With sterile technique and under real time ultrasound guidance: With [name] 25-gauge half inch needle injected with 0.5 cc of 0.5% Marcaine  and 0.5 cc of Kenalog  40 mg/mL Completed without difficulty  Pain immediately resolved suggesting accurate placement of the medication.  Advised to call if fevers/chills, erythema, induration, drainage, or persistent bleeding.  Images saved Impression: Technically successful ultrasound guided injection.    Impression and Recommendations:    The above documentation has been reviewed and is accurate and complete Carol CHRISTELLA Sharps, DO        [1]  Allergies Allergen Reactions   Bovine (Beef) Protein Other (See Comments)   Aspartame And Phenylalanine Other (See Comments)    Migraines   Bovine (Beef) Protein-Containing Drug Products Other (See Comments)    severe cramping   Citrus Other (See Comments)    Causes Interstitial cystitis flares   Penicillins Hives and Rash    Denies airway involvement Has patient had [name] PCN  reaction causing immediate rash, facial/tongue/throat swelling, SOB or lightheadedness with hypotension: yes hives.  Has patient had [name] PCN reaction causing severe rash involving mucus membranes or skin necrosis:no Has patient had [name] PCN reaction that required hospitalization No Has patient had [name] PCN reaction occurring within the last 10 years: No If all of the above answers are NO, then may proceed with Cephalosporin use.    Promethazine Hcl Other (See Comments)    Muscle twitching and contracture    Beta Vulgaris Other (See Comments)    beets   Bioflavonoids Other (See Comments)   Cat Dander Other (See Comments)   Adhesive [Tape] Other (See Comments)    Red splotches   Morphine Sulfate Nausea And Vomiting   "

## 2024-07-28 NOTE — Assessment & Plan Note (Signed)
 Patient given injection and tolerated the procedure well, discussed icing regimen and home exercises, discussed potential bracing at night.  Patient wants to hold off on surgical intervention if possible.  Follow-up with me again in 3 months with

## 2024-08-10 ENCOUNTER — Other Ambulatory Visit: Payer: Self-pay

## 2024-08-10 ENCOUNTER — Other Ambulatory Visit (HOSPITAL_COMMUNITY): Payer: Self-pay

## 2024-08-11 ENCOUNTER — Other Ambulatory Visit: Payer: Self-pay

## 2024-08-14 ENCOUNTER — Other Ambulatory Visit (HOSPITAL_COMMUNITY): Payer: Self-pay

## 2024-08-23 ENCOUNTER — Ambulatory Visit: Admitting: Family Medicine

## 2024-08-23 ENCOUNTER — Ambulatory Visit: Admitting: Psychiatry

## 2024-08-24 ENCOUNTER — Ambulatory Visit: Admitting: Family Medicine

## 2024-08-25 ENCOUNTER — Ambulatory Visit: Admitting: Psychiatry

## 2024-10-31 ENCOUNTER — Ambulatory Visit
